# Patient Record
Sex: Female | Born: 1960
Health system: Southern US, Community
[De-identification: ages and names within clinical notes are randomized; demographics above are authoritative.]

## PROBLEM LIST (undated history)

## (undated) DIAGNOSIS — K219 Gastro-esophageal reflux disease without esophagitis: Secondary | ICD-10-CM

## (undated) DIAGNOSIS — D84821 Immunodeficiency due to drugs: Secondary | ICD-10-CM

## (undated) DIAGNOSIS — E119 Type 2 diabetes mellitus without complications: Secondary | ICD-10-CM

## (undated) DIAGNOSIS — N840 Polyp of corpus uteri: Secondary | ICD-10-CM

## (undated) DIAGNOSIS — D249 Benign neoplasm of unspecified breast: Secondary | ICD-10-CM

## (undated) DIAGNOSIS — R51 Headache: Secondary | ICD-10-CM

## (undated) DIAGNOSIS — I10 Essential (primary) hypertension: Secondary | ICD-10-CM

## (undated) DIAGNOSIS — E1140 Type 2 diabetes mellitus with diabetic neuropathy, unspecified: Secondary | ICD-10-CM

## (undated) DIAGNOSIS — E079 Disorder of thyroid, unspecified: Secondary | ICD-10-CM

## (undated) DIAGNOSIS — G4730 Sleep apnea, unspecified: Secondary | ICD-10-CM

## (undated) DIAGNOSIS — E785 Hyperlipidemia, unspecified: Secondary | ICD-10-CM

## (undated) DIAGNOSIS — M75101 Unspecified rotator cuff tear or rupture of right shoulder, not specified as traumatic: Secondary | ICD-10-CM

## (undated) DIAGNOSIS — H9191 Unspecified hearing loss, right ear: Secondary | ICD-10-CM

## (undated) DIAGNOSIS — Z794 Long term (current) use of insulin: Secondary | ICD-10-CM

## (undated) DIAGNOSIS — Z79899 Other long term (current) drug therapy: Secondary | ICD-10-CM

## (undated) DIAGNOSIS — M069 Rheumatoid arthritis, unspecified: Secondary | ICD-10-CM

## (undated) DIAGNOSIS — J189 Pneumonia, unspecified organism: Secondary | ICD-10-CM

## (undated) DIAGNOSIS — R519 Headache, unspecified: Secondary | ICD-10-CM

## (undated) DIAGNOSIS — M7541 Impingement syndrome of right shoulder: Secondary | ICD-10-CM

## (undated) DIAGNOSIS — J45909 Unspecified asthma, uncomplicated: Secondary | ICD-10-CM

## (undated) DIAGNOSIS — D561 Beta thalassemia: Secondary | ICD-10-CM

## (undated) DIAGNOSIS — K760 Fatty (change of) liver, not elsewhere classified: Secondary | ICD-10-CM

## (undated) DIAGNOSIS — L409 Psoriasis, unspecified: Secondary | ICD-10-CM

## (undated) HISTORY — PX: COLONOSCOPY: SHX174

## (undated) HISTORY — PX: TONSILLECTOMY: SUR1361

## (undated) HISTORY — PX: OTHER SURGICAL HISTORY: SHX169

## (undated) HISTORY — DX: Rheumatoid arthritis, unspecified: M06.9

## (undated) HISTORY — PX: TUBAL LIGATION: SHX77

## (undated) HISTORY — PX: EYE SURGERY: SHX253

## (undated) HISTORY — PX: HAND SURGERY: SHX662

## (undated) HISTORY — DX: Polyp of corpus uteri: N84.0

## (undated) HISTORY — PX: ANKLE ARTHROSCOPY: SUR85

## (undated) HISTORY — DX: Hyperlipidemia, unspecified: E78.5

## (undated) HISTORY — DX: Benign neoplasm of unspecified breast: D24.9

## (undated) HISTORY — DX: Fatty (change of) liver, not elsewhere classified: K76.0

## (undated) HISTORY — PX: DILATION AND CURETTAGE OF UTERUS: SHX78

## (undated) HISTORY — DX: Essential (primary) hypertension: I10

## (undated) SURGERY — Surgical Case
Anesthesia: *Unknown

---

## 1997-11-30 ENCOUNTER — Other Ambulatory Visit: Admission: RE | Admit: 1997-11-30 | Discharge: 1997-11-30 | Payer: Self-pay | Admitting: Family Medicine

## 1997-11-30 ENCOUNTER — Ambulatory Visit (HOSPITAL_COMMUNITY): Admission: RE | Admit: 1997-11-30 | Discharge: 1997-11-30 | Payer: Self-pay | Admitting: Family Medicine

## 1997-12-06 ENCOUNTER — Emergency Department (HOSPITAL_COMMUNITY): Admission: EM | Admit: 1997-12-06 | Discharge: 1997-12-06 | Payer: Self-pay | Admitting: Emergency Medicine

## 1997-12-12 ENCOUNTER — Ambulatory Visit (HOSPITAL_COMMUNITY): Admission: RE | Admit: 1997-12-12 | Discharge: 1997-12-12 | Payer: Self-pay | Admitting: Family Medicine

## 1998-07-14 ENCOUNTER — Encounter: Payer: Self-pay | Admitting: Family Medicine

## 1998-07-14 ENCOUNTER — Ambulatory Visit (HOSPITAL_COMMUNITY): Admission: RE | Admit: 1998-07-14 | Discharge: 1998-07-14 | Payer: Self-pay | Admitting: Family Medicine

## 1998-08-09 ENCOUNTER — Emergency Department (HOSPITAL_COMMUNITY): Admission: EM | Admit: 1998-08-09 | Discharge: 1998-08-09 | Payer: Self-pay

## 1998-08-11 ENCOUNTER — Ambulatory Visit (HOSPITAL_COMMUNITY): Admission: RE | Admit: 1998-08-11 | Discharge: 1998-08-11 | Payer: Self-pay | Admitting: Family Medicine

## 1998-08-18 ENCOUNTER — Encounter: Payer: Self-pay | Admitting: Emergency Medicine

## 1998-08-18 ENCOUNTER — Emergency Department (HOSPITAL_COMMUNITY): Admission: EM | Admit: 1998-08-18 | Discharge: 1998-08-19 | Payer: Self-pay | Admitting: Emergency Medicine

## 1998-10-02 ENCOUNTER — Ambulatory Visit (HOSPITAL_COMMUNITY): Admission: RE | Admit: 1998-10-02 | Discharge: 1998-10-02 | Payer: Self-pay | Admitting: Family Medicine

## 1998-10-03 ENCOUNTER — Encounter: Payer: Self-pay | Admitting: Family Medicine

## 1998-11-06 ENCOUNTER — Emergency Department (HOSPITAL_COMMUNITY): Admission: EM | Admit: 1998-11-06 | Discharge: 1998-11-06 | Payer: Self-pay | Admitting: Emergency Medicine

## 1998-11-06 ENCOUNTER — Encounter: Payer: Self-pay | Admitting: Emergency Medicine

## 1998-12-26 ENCOUNTER — Other Ambulatory Visit: Admission: RE | Admit: 1998-12-26 | Discharge: 1998-12-26 | Payer: Self-pay | Admitting: Family Medicine

## 1999-01-11 ENCOUNTER — Encounter: Admission: RE | Admit: 1999-01-11 | Discharge: 1999-01-11 | Payer: Self-pay | Admitting: Obstetrics

## 1999-01-12 ENCOUNTER — Encounter: Admission: RE | Admit: 1999-01-12 | Discharge: 1999-04-12 | Payer: Self-pay | Admitting: Obstetrics

## 1999-01-17 ENCOUNTER — Encounter: Admission: RE | Admit: 1999-01-17 | Discharge: 1999-01-17 | Payer: Self-pay | Admitting: Obstetrics & Gynecology

## 1999-01-24 ENCOUNTER — Encounter: Admission: RE | Admit: 1999-01-24 | Discharge: 1999-01-24 | Payer: Self-pay | Admitting: Obstetrics & Gynecology

## 1999-01-26 ENCOUNTER — Ambulatory Visit (HOSPITAL_COMMUNITY): Admission: RE | Admit: 1999-01-26 | Discharge: 1999-01-26 | Payer: Self-pay | Admitting: Obstetrics & Gynecology

## 1999-01-31 ENCOUNTER — Encounter: Admission: RE | Admit: 1999-01-31 | Discharge: 1999-01-31 | Payer: Self-pay | Admitting: Obstetrics & Gynecology

## 1999-02-07 ENCOUNTER — Encounter: Admission: RE | Admit: 1999-02-07 | Discharge: 1999-02-07 | Payer: Self-pay | Admitting: Obstetrics & Gynecology

## 1999-02-07 ENCOUNTER — Ambulatory Visit (HOSPITAL_COMMUNITY): Admission: RE | Admit: 1999-02-07 | Discharge: 1999-02-07 | Payer: Self-pay | Admitting: Obstetrics & Gynecology

## 1999-02-07 ENCOUNTER — Encounter: Payer: Self-pay | Admitting: Obstetrics & Gynecology

## 1999-02-21 ENCOUNTER — Encounter: Admission: RE | Admit: 1999-02-21 | Discharge: 1999-02-21 | Payer: Self-pay | Admitting: Obstetrics & Gynecology

## 1999-03-07 ENCOUNTER — Encounter: Admission: RE | Admit: 1999-03-07 | Discharge: 1999-03-07 | Payer: Self-pay | Admitting: Obstetrics & Gynecology

## 1999-03-14 ENCOUNTER — Encounter: Admission: RE | Admit: 1999-03-14 | Discharge: 1999-03-14 | Payer: Self-pay | Admitting: Obstetrics & Gynecology

## 1999-03-20 ENCOUNTER — Ambulatory Visit (HOSPITAL_COMMUNITY): Admission: RE | Admit: 1999-03-20 | Discharge: 1999-03-20 | Payer: Self-pay | Admitting: *Deleted

## 1999-03-21 ENCOUNTER — Encounter: Admission: RE | Admit: 1999-03-21 | Discharge: 1999-03-21 | Payer: Self-pay | Admitting: Obstetrics & Gynecology

## 1999-04-04 ENCOUNTER — Encounter: Admission: RE | Admit: 1999-04-04 | Discharge: 1999-04-04 | Payer: Self-pay | Admitting: Obstetrics & Gynecology

## 1999-04-18 ENCOUNTER — Encounter: Admission: RE | Admit: 1999-04-18 | Discharge: 1999-04-18 | Payer: Self-pay | Admitting: Obstetrics & Gynecology

## 1999-04-25 ENCOUNTER — Encounter: Admission: RE | Admit: 1999-04-25 | Discharge: 1999-04-25 | Payer: Self-pay | Admitting: Obstetrics & Gynecology

## 1999-05-04 ENCOUNTER — Inpatient Hospital Stay (HOSPITAL_COMMUNITY): Admission: AD | Admit: 1999-05-04 | Discharge: 1999-05-04 | Payer: Self-pay | Admitting: *Deleted

## 1999-05-08 ENCOUNTER — Ambulatory Visit (HOSPITAL_COMMUNITY): Admission: RE | Admit: 1999-05-08 | Discharge: 1999-05-08 | Payer: Self-pay | Admitting: *Deleted

## 1999-05-09 ENCOUNTER — Encounter: Admission: RE | Admit: 1999-05-09 | Discharge: 1999-05-09 | Payer: Self-pay | Admitting: Obstetrics & Gynecology

## 1999-05-16 ENCOUNTER — Encounter: Admission: RE | Admit: 1999-05-16 | Discharge: 1999-05-16 | Payer: Self-pay | Admitting: Obstetrics & Gynecology

## 1999-05-23 ENCOUNTER — Encounter: Admission: RE | Admit: 1999-05-23 | Discharge: 1999-05-23 | Payer: Self-pay | Admitting: Obstetrics & Gynecology

## 1999-05-30 ENCOUNTER — Encounter: Admission: RE | Admit: 1999-05-30 | Discharge: 1999-05-30 | Payer: Self-pay | Admitting: Obstetrics & Gynecology

## 1999-06-06 ENCOUNTER — Encounter: Admission: RE | Admit: 1999-06-06 | Discharge: 1999-06-06 | Payer: Self-pay | Admitting: Obstetrics

## 1999-06-12 ENCOUNTER — Ambulatory Visit (HOSPITAL_COMMUNITY): Admission: RE | Admit: 1999-06-12 | Discharge: 1999-06-12 | Payer: Self-pay | Admitting: Obstetrics

## 1999-06-13 ENCOUNTER — Encounter: Admission: RE | Admit: 1999-06-13 | Discharge: 1999-06-13 | Payer: Self-pay | Admitting: Obstetrics & Gynecology

## 1999-06-20 ENCOUNTER — Encounter: Admission: RE | Admit: 1999-06-20 | Discharge: 1999-06-20 | Payer: Self-pay | Admitting: Obstetrics & Gynecology

## 1999-06-27 ENCOUNTER — Encounter (HOSPITAL_COMMUNITY): Admission: RE | Admit: 1999-06-27 | Discharge: 1999-08-08 | Payer: Self-pay | Admitting: Obstetrics

## 1999-06-27 ENCOUNTER — Encounter: Admission: RE | Admit: 1999-06-27 | Discharge: 1999-06-27 | Payer: Self-pay | Admitting: Obstetrics & Gynecology

## 1999-07-04 ENCOUNTER — Encounter: Admission: RE | Admit: 1999-07-04 | Discharge: 1999-07-04 | Payer: Self-pay | Admitting: Obstetrics & Gynecology

## 1999-07-05 ENCOUNTER — Inpatient Hospital Stay (HOSPITAL_COMMUNITY): Admission: AD | Admit: 1999-07-05 | Discharge: 1999-07-05 | Payer: Self-pay | Admitting: Obstetrics & Gynecology

## 1999-07-11 ENCOUNTER — Encounter: Admission: RE | Admit: 1999-07-11 | Discharge: 1999-07-11 | Payer: Self-pay | Admitting: Obstetrics & Gynecology

## 1999-07-18 ENCOUNTER — Encounter: Admission: RE | Admit: 1999-07-18 | Discharge: 1999-07-18 | Payer: Self-pay | Admitting: Obstetrics & Gynecology

## 1999-07-25 ENCOUNTER — Inpatient Hospital Stay (HOSPITAL_COMMUNITY): Admission: AD | Admit: 1999-07-25 | Discharge: 1999-07-25 | Payer: Self-pay | Admitting: Obstetrics

## 1999-07-25 ENCOUNTER — Encounter: Admission: RE | Admit: 1999-07-25 | Discharge: 1999-07-25 | Payer: Self-pay | Admitting: Obstetrics & Gynecology

## 1999-08-01 ENCOUNTER — Encounter: Admission: RE | Admit: 1999-08-01 | Discharge: 1999-08-01 | Payer: Self-pay | Admitting: Obstetrics & Gynecology

## 1999-08-07 ENCOUNTER — Inpatient Hospital Stay (HOSPITAL_COMMUNITY): Admission: AD | Admit: 1999-08-07 | Discharge: 1999-08-11 | Payer: Self-pay | Admitting: *Deleted

## 1999-08-07 ENCOUNTER — Encounter (INDEPENDENT_AMBULATORY_CARE_PROVIDER_SITE_OTHER): Payer: Self-pay | Admitting: Specialist

## 1999-08-12 ENCOUNTER — Encounter: Admission: RE | Admit: 1999-08-12 | Discharge: 1999-09-11 | Payer: Self-pay | Admitting: Obstetrics & Gynecology

## 1999-08-13 ENCOUNTER — Inpatient Hospital Stay (HOSPITAL_COMMUNITY): Admission: EM | Admit: 1999-08-13 | Discharge: 1999-08-13 | Payer: Self-pay | Admitting: Obstetrics

## 1999-09-26 ENCOUNTER — Ambulatory Visit (HOSPITAL_COMMUNITY): Admission: RE | Admit: 1999-09-26 | Discharge: 1999-09-26 | Payer: Self-pay | Admitting: Family Medicine

## 1999-10-01 ENCOUNTER — Ambulatory Visit (HOSPITAL_COMMUNITY): Admission: RE | Admit: 1999-10-01 | Discharge: 1999-10-01 | Payer: Self-pay | Admitting: Family Medicine

## 1999-10-02 ENCOUNTER — Encounter: Payer: Self-pay | Admitting: Family Medicine

## 1999-10-03 ENCOUNTER — Ambulatory Visit (HOSPITAL_COMMUNITY): Admission: RE | Admit: 1999-10-03 | Discharge: 1999-10-03 | Payer: Self-pay | Admitting: Orthopaedic Surgery

## 1999-10-03 ENCOUNTER — Encounter: Payer: Self-pay | Admitting: Family Medicine

## 1999-12-02 ENCOUNTER — Encounter: Payer: Self-pay | Admitting: Emergency Medicine

## 1999-12-02 ENCOUNTER — Emergency Department (HOSPITAL_COMMUNITY): Admission: EM | Admit: 1999-12-02 | Discharge: 1999-12-02 | Payer: Self-pay | Admitting: Emergency Medicine

## 2001-07-31 ENCOUNTER — Encounter: Admission: RE | Admit: 2001-07-31 | Discharge: 2001-07-31 | Payer: Self-pay | Admitting: *Deleted

## 2001-08-18 ENCOUNTER — Ambulatory Visit (HOSPITAL_COMMUNITY): Admission: RE | Admit: 2001-08-18 | Discharge: 2001-08-18 | Payer: Self-pay | Admitting: *Deleted

## 2001-08-18 ENCOUNTER — Encounter (INDEPENDENT_AMBULATORY_CARE_PROVIDER_SITE_OTHER): Payer: Self-pay | Admitting: *Deleted

## 2001-12-08 ENCOUNTER — Encounter: Admission: RE | Admit: 2001-12-08 | Discharge: 2001-12-08 | Payer: Self-pay | Admitting: Specialist

## 2001-12-08 ENCOUNTER — Encounter: Payer: Self-pay | Admitting: Specialist

## 2003-08-01 ENCOUNTER — Emergency Department (HOSPITAL_COMMUNITY): Admission: EM | Admit: 2003-08-01 | Discharge: 2003-08-02 | Payer: Self-pay

## 2004-01-13 ENCOUNTER — Other Ambulatory Visit: Admission: RE | Admit: 2004-01-13 | Discharge: 2004-01-23 | Payer: Self-pay | Admitting: Internal Medicine

## 2004-03-07 ENCOUNTER — Ambulatory Visit (HOSPITAL_COMMUNITY): Admission: RE | Admit: 2004-03-07 | Discharge: 2004-03-07 | Payer: Self-pay | Admitting: Internal Medicine

## 2004-04-20 ENCOUNTER — Encounter: Admission: RE | Admit: 2004-04-20 | Discharge: 2004-04-20 | Payer: Self-pay | Admitting: Internal Medicine

## 2004-05-01 ENCOUNTER — Encounter: Admission: RE | Admit: 2004-05-01 | Discharge: 2004-06-06 | Payer: Self-pay | Admitting: Internal Medicine

## 2004-05-11 ENCOUNTER — Emergency Department (HOSPITAL_COMMUNITY): Admission: EM | Admit: 2004-05-11 | Discharge: 2004-05-11 | Payer: Self-pay | Admitting: Emergency Medicine

## 2004-06-14 ENCOUNTER — Encounter: Admission: RE | Admit: 2004-06-14 | Discharge: 2004-09-12 | Payer: Self-pay | Admitting: Internal Medicine

## 2004-06-20 ENCOUNTER — Encounter: Admission: RE | Admit: 2004-06-20 | Discharge: 2004-06-20 | Payer: Self-pay | Admitting: Internal Medicine

## 2004-07-22 ENCOUNTER — Emergency Department (HOSPITAL_COMMUNITY): Admission: EM | Admit: 2004-07-22 | Discharge: 2004-07-22 | Payer: Self-pay | Admitting: Emergency Medicine

## 2004-09-14 ENCOUNTER — Encounter: Admission: RE | Admit: 2004-09-14 | Discharge: 2004-09-14 | Payer: Self-pay | Admitting: Internal Medicine

## 2005-03-07 ENCOUNTER — Encounter: Admission: RE | Admit: 2005-03-07 | Discharge: 2005-03-07 | Payer: Self-pay | Admitting: Internal Medicine

## 2005-04-15 ENCOUNTER — Other Ambulatory Visit: Admission: RE | Admit: 2005-04-15 | Discharge: 2005-04-15 | Payer: Self-pay | Admitting: Cardiology

## 2005-12-14 ENCOUNTER — Emergency Department (HOSPITAL_COMMUNITY): Admission: EM | Admit: 2005-12-14 | Discharge: 2005-12-14 | Payer: Self-pay | Admitting: Emergency Medicine

## 2006-04-30 ENCOUNTER — Encounter: Admission: RE | Admit: 2006-04-30 | Discharge: 2006-04-30 | Payer: Self-pay | Admitting: Internal Medicine

## 2006-05-13 ENCOUNTER — Encounter: Admission: RE | Admit: 2006-05-13 | Discharge: 2006-05-13 | Payer: Self-pay | Admitting: Internal Medicine

## 2006-12-02 ENCOUNTER — Encounter: Admission: RE | Admit: 2006-12-02 | Discharge: 2006-12-02 | Payer: Self-pay | Admitting: Internal Medicine

## 2006-12-11 ENCOUNTER — Encounter: Admission: RE | Admit: 2006-12-11 | Discharge: 2006-12-11 | Payer: Self-pay | Admitting: Internal Medicine

## 2006-12-16 ENCOUNTER — Encounter: Admission: RE | Admit: 2006-12-16 | Discharge: 2006-12-16 | Payer: Self-pay | Admitting: Internal Medicine

## 2006-12-30 ENCOUNTER — Other Ambulatory Visit: Payer: Self-pay | Admitting: Emergency Medicine

## 2006-12-30 ENCOUNTER — Inpatient Hospital Stay (HOSPITAL_COMMUNITY): Admission: AD | Admit: 2006-12-30 | Discharge: 2007-01-01 | Payer: Self-pay | Admitting: Internal Medicine

## 2007-05-04 ENCOUNTER — Encounter: Admission: RE | Admit: 2007-05-04 | Discharge: 2007-05-04 | Payer: Self-pay | Admitting: Internal Medicine

## 2007-06-15 ENCOUNTER — Emergency Department (HOSPITAL_COMMUNITY): Admission: EM | Admit: 2007-06-15 | Discharge: 2007-06-15 | Payer: Self-pay | Admitting: Family Medicine

## 2007-08-05 ENCOUNTER — Other Ambulatory Visit: Admission: RE | Admit: 2007-08-05 | Discharge: 2007-08-05 | Payer: Self-pay | Admitting: Internal Medicine

## 2007-08-18 ENCOUNTER — Encounter: Admission: RE | Admit: 2007-08-18 | Discharge: 2007-08-18 | Payer: Self-pay | Admitting: Internal Medicine

## 2007-11-25 ENCOUNTER — Encounter: Admission: RE | Admit: 2007-11-25 | Discharge: 2007-11-25 | Payer: Self-pay | Admitting: Internal Medicine

## 2008-01-04 ENCOUNTER — Encounter (INDEPENDENT_AMBULATORY_CARE_PROVIDER_SITE_OTHER): Payer: Self-pay | Admitting: Obstetrics and Gynecology

## 2008-01-04 ENCOUNTER — Ambulatory Visit (HOSPITAL_COMMUNITY): Admission: RE | Admit: 2008-01-04 | Discharge: 2008-01-04 | Payer: Self-pay | Admitting: Obstetrics and Gynecology

## 2008-03-01 ENCOUNTER — Encounter: Admission: RE | Admit: 2008-03-01 | Discharge: 2008-03-01 | Payer: Self-pay | Admitting: Internal Medicine

## 2008-04-07 ENCOUNTER — Encounter: Admission: RE | Admit: 2008-04-07 | Discharge: 2008-05-30 | Payer: Self-pay | Admitting: Internal Medicine

## 2009-01-27 ENCOUNTER — Ambulatory Visit: Payer: Self-pay | Admitting: Oncology

## 2009-02-02 LAB — CBC WITH DIFFERENTIAL/PLATELET
BASO%: 0.7 % (ref 0.0–2.0)
Eosinophils Absolute: 0.1 10*3/uL (ref 0.0–0.5)
LYMPH%: 35.1 % (ref 14.0–49.7)
MCHC: 32.1 g/dL (ref 31.5–36.0)
MONO#: 0.7 10*3/uL (ref 0.1–0.9)
MONO%: 12.4 % (ref 0.0–14.0)
NEUT#: 3 10*3/uL (ref 1.5–6.5)
Platelets: 292 10*3/uL (ref 145–400)
RBC: 4.73 10*6/uL (ref 3.70–5.45)
RDW: 15.3 % — ABNORMAL HIGH (ref 11.2–14.5)
WBC: 5.9 10*3/uL (ref 3.9–10.3)

## 2009-02-06 LAB — IRON AND TIBC
%SAT: 27 % (ref 20–55)
TIBC: 327 ug/dL (ref 250–470)

## 2009-02-06 LAB — HEMOGLOBINOPATHY EVALUATION
Hgb A2 Quant: 5.9 % — ABNORMAL HIGH (ref 2.2–3.2)
Hgb A: 93.4 % — ABNORMAL LOW (ref 96.8–97.8)
Hgb S Quant: 0 % (ref 0.0–0.0)

## 2009-02-24 ENCOUNTER — Encounter: Admission: RE | Admit: 2009-02-24 | Discharge: 2009-02-24 | Payer: Self-pay | Admitting: Internal Medicine

## 2009-02-25 ENCOUNTER — Emergency Department (HOSPITAL_COMMUNITY): Admission: EM | Admit: 2009-02-25 | Discharge: 2009-02-25 | Payer: Self-pay | Admitting: Emergency Medicine

## 2009-02-28 ENCOUNTER — Ambulatory Visit: Payer: Self-pay | Admitting: Oncology

## 2009-03-02 LAB — IRON AND TIBC
%SAT: 30 % (ref 20–55)
TIBC: 303 ug/dL (ref 250–470)
UIBC: 212 ug/dL

## 2009-03-02 LAB — CBC WITH DIFFERENTIAL/PLATELET
Eosinophils Absolute: 0.1 10*3/uL (ref 0.0–0.5)
HCT: 30.5 % — ABNORMAL LOW (ref 34.8–46.6)
LYMPH%: 38.9 % (ref 14.0–49.7)
MCV: 66.3 fL — ABNORMAL LOW (ref 79.5–101.0)
MONO#: 0.8 10*3/uL (ref 0.1–0.9)
MONO%: 12.5 % (ref 0.0–14.0)
NEUT#: 2.9 10*3/uL (ref 1.5–6.5)
NEUT%: 46.5 % (ref 38.4–76.8)
Platelets: 358 10*3/uL (ref 145–400)
RBC: 4.59 10*6/uL (ref 3.70–5.45)

## 2009-03-16 ENCOUNTER — Encounter: Admission: RE | Admit: 2009-03-16 | Discharge: 2009-03-16 | Payer: Self-pay | Admitting: Neurosurgery

## 2009-03-21 ENCOUNTER — Encounter: Admission: RE | Admit: 2009-03-21 | Discharge: 2009-03-21 | Payer: Self-pay | Admitting: Neurosurgery

## 2009-06-08 ENCOUNTER — Ambulatory Visit: Payer: Self-pay | Admitting: Oncology

## 2009-07-10 ENCOUNTER — Ambulatory Visit: Payer: Self-pay | Admitting: Oncology

## 2009-07-12 LAB — COMPREHENSIVE METABOLIC PANEL
ALT: 10 U/L (ref 0–35)
AST: 11 U/L (ref 0–37)
Alkaline Phosphatase: 51 U/L (ref 39–117)
Chloride: 102 mEq/L (ref 96–112)
Creatinine, Ser: 0.54 mg/dL (ref 0.40–1.20)
Total Bilirubin: 0.6 mg/dL (ref 0.3–1.2)

## 2009-07-12 LAB — CBC WITH DIFFERENTIAL/PLATELET
BASO%: 0 % (ref 0.0–2.0)
EOS%: 0.1 % (ref 0.0–7.0)
HCT: 33.6 % — ABNORMAL LOW (ref 34.8–46.6)
MCH: 21.3 pg — ABNORMAL LOW (ref 25.1–34.0)
MCHC: 31.8 g/dL (ref 31.5–36.0)
MONO%: 3.3 % (ref 0.0–14.0)
NEUT%: 83.7 % — ABNORMAL HIGH (ref 38.4–76.8)
lymph#: 1.3 10*3/uL (ref 0.9–3.3)

## 2009-07-12 LAB — IRON AND TIBC
%SAT: 22 % (ref 20–55)
TIBC: 336 ug/dL (ref 250–470)

## 2009-08-26 ENCOUNTER — Emergency Department (HOSPITAL_COMMUNITY): Admission: EM | Admit: 2009-08-26 | Discharge: 2009-08-27 | Payer: Self-pay | Admitting: Emergency Medicine

## 2009-09-28 ENCOUNTER — Emergency Department (HOSPITAL_COMMUNITY): Admission: EM | Admit: 2009-09-28 | Discharge: 2009-09-28 | Payer: Self-pay | Admitting: Emergency Medicine

## 2009-12-07 ENCOUNTER — Encounter (HOSPITAL_COMMUNITY): Admission: RE | Admit: 2009-12-07 | Discharge: 2010-03-01 | Payer: Self-pay | Admitting: Internal Medicine

## 2009-12-07 ENCOUNTER — Ambulatory Visit: Payer: Self-pay | Admitting: Oncology

## 2010-01-09 ENCOUNTER — Ambulatory Visit: Payer: Self-pay | Admitting: Oncology

## 2010-01-09 LAB — CBC WITH DIFFERENTIAL/PLATELET
Basophils Absolute: 0.1 10*3/uL (ref 0.0–0.1)
Eosinophils Absolute: 0.1 10*3/uL (ref 0.0–0.5)
HCT: 32.6 % — ABNORMAL LOW (ref 34.8–46.6)
LYMPH%: 33.9 % (ref 14.0–49.7)
MONO#: 0.8 10*3/uL (ref 0.1–0.9)
NEUT#: 5.8 10*3/uL (ref 1.5–6.5)
NEUT%: 56.1 % (ref 38.4–76.8)
Platelets: 365 10*3/uL (ref 145–400)
WBC: 10.4 10*3/uL — ABNORMAL HIGH (ref 3.9–10.3)

## 2010-01-09 LAB — IRON AND TIBC
%SAT: 30 % (ref 20–55)
Iron: 104 ug/dL (ref 42–145)
TIBC: 343 ug/dL (ref 250–470)

## 2010-01-09 LAB — COMPREHENSIVE METABOLIC PANEL
CO2: 22 mEq/L (ref 19–32)
Creatinine, Ser: 0.67 mg/dL (ref 0.40–1.20)
Glucose, Bld: 245 mg/dL — ABNORMAL HIGH (ref 70–99)
Total Bilirubin: 0.4 mg/dL (ref 0.3–1.2)
Total Protein: 7 g/dL (ref 6.0–8.3)

## 2010-01-09 LAB — FERRITIN: Ferritin: 42 ng/mL (ref 10–291)

## 2010-03-14 ENCOUNTER — Encounter (HOSPITAL_COMMUNITY)
Admission: RE | Admit: 2010-03-14 | Discharge: 2010-06-12 | Payer: Self-pay | Source: Home / Self Care | Attending: Internal Medicine | Admitting: Internal Medicine

## 2010-04-17 ENCOUNTER — Encounter: Admission: RE | Admit: 2010-04-17 | Discharge: 2010-04-17 | Payer: Self-pay | Admitting: Internal Medicine

## 2010-07-01 ENCOUNTER — Encounter: Payer: Self-pay | Admitting: Internal Medicine

## 2010-08-30 ENCOUNTER — Encounter (HOSPITAL_COMMUNITY): Payer: Medicare Other | Attending: Internal Medicine

## 2010-08-30 DIAGNOSIS — M069 Rheumatoid arthritis, unspecified: Secondary | ICD-10-CM | POA: Insufficient documentation

## 2010-08-31 LAB — DIFFERENTIAL
Basophils Absolute: 0 10*3/uL (ref 0.0–0.1)
Eosinophils Absolute: 0.1 10*3/uL (ref 0.0–0.7)
Eosinophils Relative: 1 % (ref 0–5)
Monocytes Absolute: 0.7 10*3/uL (ref 0.1–1.0)

## 2010-08-31 LAB — CBC
MCHC: 31 g/dL (ref 30.0–36.0)
Platelets: 353 10*3/uL (ref 150–400)
RDW: 16.3 % — ABNORMAL HIGH (ref 11.5–15.5)

## 2010-08-31 LAB — POCT I-STAT, CHEM 8
Calcium, Ion: 1.15 mmol/L (ref 1.12–1.32)
Creatinine, Ser: 0.4 mg/dL (ref 0.4–1.2)
Hemoglobin: 10.9 g/dL — ABNORMAL LOW (ref 12.0–15.0)
Sodium: 137 mEq/L (ref 135–145)
TCO2: 25 mmol/L (ref 0–100)

## 2010-08-31 LAB — GLUCOSE, CAPILLARY
Glucose-Capillary: 210 mg/dL — ABNORMAL HIGH (ref 70–99)
Glucose-Capillary: 234 mg/dL — ABNORMAL HIGH (ref 70–99)
Glucose-Capillary: 316 mg/dL — ABNORMAL HIGH (ref 70–99)

## 2010-08-31 LAB — CK TOTAL AND CKMB (NOT AT ARMC)
CK, MB: 0.9 ng/mL (ref 0.3–4.0)
Total CK: 72 U/L (ref 7–177)

## 2010-09-12 ENCOUNTER — Encounter (HOSPITAL_COMMUNITY): Payer: Medicare Other | Attending: Internal Medicine

## 2010-09-12 DIAGNOSIS — M069 Rheumatoid arthritis, unspecified: Secondary | ICD-10-CM | POA: Insufficient documentation

## 2010-09-13 ENCOUNTER — Encounter (HOSPITAL_COMMUNITY): Payer: Medicare Other

## 2010-09-20 ENCOUNTER — Other Ambulatory Visit (HOSPITAL_COMMUNITY)
Admission: RE | Admit: 2010-09-20 | Discharge: 2010-09-20 | Disposition: A | Discharge status: Home / Self Care | Payer: Medicare Other | Source: Ambulatory Visit | Attending: Internal Medicine | Admitting: Internal Medicine

## 2010-09-20 ENCOUNTER — Other Ambulatory Visit: Payer: Self-pay | Admitting: Internal Medicine

## 2010-09-20 ENCOUNTER — Other Ambulatory Visit: Payer: Self-pay | Admitting: Registered Nurse

## 2010-09-20 DIAGNOSIS — N6321 Unspecified lump in the left breast, upper outer quadrant: Secondary | ICD-10-CM

## 2010-09-20 DIAGNOSIS — Z124 Encounter for screening for malignant neoplasm of cervix: Secondary | ICD-10-CM | POA: Insufficient documentation

## 2010-09-25 ENCOUNTER — Ambulatory Visit
Admission: RE | Admit: 2010-09-25 | Discharge: 2010-09-25 | Disposition: A | Discharge status: Home / Self Care | Payer: Medicare Other | Source: Ambulatory Visit | Attending: Internal Medicine | Admitting: Internal Medicine

## 2010-09-25 DIAGNOSIS — N6321 Unspecified lump in the left breast, upper outer quadrant: Secondary | ICD-10-CM

## 2010-09-26 ENCOUNTER — Encounter (HOSPITAL_COMMUNITY): Payer: Medicare Other

## 2010-10-23 NOTE — Discharge Summary (Signed)
NAMETIERNEY, BEHL             ACCOUNT NO.:  [id number]   MEDICAL RECORD NO.:  [id number]          PATIENT TYPE:  INP   LOCATION:  5123                         FACILITY:  [facility]   PHYSICIAN:  Marcellus Scott, MD     DATE OF BIRTH:  1960-12-25   DATE OF ADMISSION:  12/30/2006  DATE OF DISCHARGE:  01/01/2007                               DISCHARGE SUMMARY   PRIMARY MEDICAL DOCTOR:  Georgianne Fick, M.D.   DISCHARGE DIAGNOSES:  1. Right thigh and mons pubis abscess, status post incision and      drainage, possibly MRSA.  2  Uncontrolled diabetes.  1. Hypertension.  2. Hypokalemia.  3. Anemia.  4. Leukocytosis.  5. Rheumatoid arthritis.  6. Sore neck, likely musculoskeletal in origin.   DISCHARGE MEDICATIONS:  1. Lyrica 25 mg p.o. b.i.d.  2. Avalide 300/25mg , one p.o. daily.  3. Januvia 100 mg p.o. daily.  4. Actos 30 mg p.o. daily.  5. Lantus 70 units subcutaneously q.12 h.  6. NovoLog sliding scale insulin with h.s. scale after CBGs t.i.d.      a.c. and h.s.  7. Doxycycline 100 mg p.o. b.i.d. for a week.  8. Crestor, Folic acid, Glucophage, Rhinocort at previous home dosages  9. Tylenol 650mg  po q 4-6 hourly prn  10.Oxycodone 5mg  po q 4 hourly prn  11.Prednisone 5mg  po daily   PROCEDURES:  Portable chest x-ray on December 30, 2006.  Impression is  cardiomegaly with linear right upper lobe scarring.  No acute  abnormalities.   PERTINENT LABORATORY DATA:  Basic metabolic panel today remarkable for  potassium of 3.4, BUN 11, creatinine 0.57 and glucose 151.  CBCs with  hemoglobin 9.4, hematocrit 30.1, platelets 341, white blood cell 9.2.  Blood cultures negative to date.  Wound cultures are Gram positive cocci  in clusters.  Final reports are pending.  No anaerobes.  Final cultures  to be followed as an outpatient.   CONSULTATIONS:  Surgery by Dr. [name] and Dr. Johna Sheriff of [facility].   HOSPITAL COURSE/PATIENT DISPOSITION:  For details of the initial  part of  the admission, please refer to the History and Physical note that was  done by Dr. Lonia Blood.  In summary, Ms. Grilliot is a pleasant 50-year-  old African American female patient with history of type 2 diabetes,  rheumatoid arthritis, hypertension, who was referred from Urgent Care  for admission.  She presented with history of pain and swelling of the  right thigh.  This was getting worse despite oral antibiotic therapy.  Further evaluation revealed that the patient had abscess of right thigh  and mons pubis.  The patient was thereby admitted to the medical floor.  Her blood cultures were sent off.  A surgical consult was obtained.  The  surgeons proceeded to incise and debride the abscess.  She was placed on  empiric antibiotics of intravenous Clindamycin and Vancomycin.  With  these measures, the patient's right thigh is significantly better with  marked reduction in her pain, swelling and redness.  Her fevers have  resolved.  Her wound is said to be  clean according to surgery.  The  surgeons have indicated that the patient is stable for discharge from  their perspective with home health nurse for daily dressings with normal  saline gauzes.  They indicated that she might not actually even need  antibiotics, but have suggested a week's course of doxycycline, and she  is to follow up with the surgeons in 1-2 weeks time.  The patient has  been instructed to seek immediate medical attention if there is any  further deterioration in her overall condition.  Her glycemic control  has been poor secondary to her ongoing infection.  Her insulins have  been titrated.  With improvement in her infection, it is expected that  her insulin requirements may decrease.  She has been advised and  counseled regarding hypoglycemic symptoms and management.  To consider  adjusting her insulins as an outpatient.  The patient's hypokalemia will  be repleted prior to discharge.  Please follow up  patients hemoglobin  A1C and her basic metabolic panel as an outpatient.  The patient, this  morning, was complaining of some sore/stiffness around the neck with dry  mouth and sore throat.  However, there is no lymphadenopathy or  tenderness or neck stiffness.  Her oral cavity is not remarkable for any  oropharyngeal erythema.  This is probably musculoskeletal in origin.  The patient has been provided pain medications and to monitor this  closely.  The patients' Enbrel is on hold at this time secondary to her  ongoing infection.  The patient is counseled to follow up with her  rheumatologist regarding restarting this at a later time.  An  appointment has been made for the patient to be followed up with her  primary medical doctor on January 05, 2007, at 1:25 p.m.      Marcellus Scott, MD  Electronically Signed     AH/MEDQ  D:  01/01/2007  T:  01/01/2007  Job:  [id number]   cc:   Georgianne Fick, M.D.  Gabrielle Dare [name], M.D.  Lorne Skeens. Hoxworth, M.D.  Lemmie Evens, M.D.

## 2010-10-23 NOTE — Op Note (Signed)
NAMETKAI, LARGE             ACCOUNT NO.:  [id number]   MEDICAL RECORD NO.:  [id number]          PATIENT TYPE:  AMB   LOCATION:  SDC                           FACILITY:  [facility]   PHYSICIAN:  Juluis Mire, M.D.   DATE OF BIRTH:  [birth year]   DATE OF PROCEDURE:  01/04/2008  DATE OF DISCHARGE:                               OPERATIVE REPORT   PREOPERATIVE DIAGNOSIS:  Endometrial polyp.   POSTOPERATIVE DIAGNOSIS:  Endometrial polyp.   PROCEDURE:  Paracervical block.  Cervical dilatation.  Hysteroscopic  evaluation with resection of polyps.  Endometrial curettings.   SURGEON:  Juluis Mire, MD   ANESTHESIA:  General.   ESTIMATED BLOOD LOSS:  Minimal.   PACKS AND DRAINS:  None.   INTRAOPERATIVE BLOOD PLACED:  None.   COMPLICATIONS:  None.   INDICATIONS:  As dictated in the history and physical.   PROCEDURE:  The patient was taken to the OR and placed in the supine  position.  After satisfactory level of general anesthesia was obtained  using a LMA, the patient was placed in the dorsolithotomy position.  Perineum and vagina prepped with Betadine and draped in a sterile  fields.  Speculum was placed in the vaginal vault.  The cervix was  grasped with a single-tooth tenaculum.  Paracervical block with 1%  Nesacaine was instituted.  Uterus sounded to 10 cm.  Cervix dilated with  a size-31 Pratt dilator.  Operative  hysteroscope was then introduced.  Intrauterine cavity was descended using sorbitol.  Visualization  revealed multiple endometrial polyps.  These were resected and sent to  the pathology.  Once all the polyps been resected.  We did endometrial  curettings.  These were also sent to pathology.  Total deficit was about  250 mL.  She had no active bleed or signs of perforation.  At this point  in time, the single-tooth tenaculum was inspected and removed.  The  patient was taken out of the dorsolithotomy position, once alert and  extubated, transferred to recovery  room in good condition.  Sponge,  instruments, and needle count was reported correct by circulating nurse.     Juluis Mire, M.D.  Electronically Signed    JSM/MEDQ  D:  01/04/2008  T:  01/04/2008  Job:  [id number]

## 2010-10-23 NOTE — Op Note (Signed)
NAMEELIZABETHANNE, Jessica Howard             ACCOUNT NO.:  [id number]   MEDICAL RECORD NO.:  [id number]          PATIENT TYPE:  INP   LOCATION:  5123                         FACILITY:  [facility]   PHYSICIAN:  [name], M.D.DATE OF BIRTH:  22-Jan-1961   DATE OF PROCEDURE:  12/30/2006  DATE OF DISCHARGE:                               OPERATIVE REPORT   PREOPERATIVE DIAGNOSIS:  Abscess right thigh and left mons pubis.   POSTOPERATIVE DIAGNOSIS:  Abscess right thigh and left mons pubis.   PROCEDURE:  Incision and drainage and debridement of abscess right thigh  and left mons pubis.   SURGEON:  Violeta Gelinas, M.D.   ANESTHESIA:  General.   HISTORY OF PRESENT ILLNESS:  The patient is a 50 year old African  American female who was admitted to the medical service today with  uncontrolled diabetes and abscess and cellulitis of the right thigh and  the left mons pubis.  She is brought emergently to the operating room  for drainage.   PROCEDURE IN DETAIL:  Informed consent was obtained.  The patient is  receiving intravenous antibiotics.  She was brought to the operating  room.  General anesthesia was administered by the anesthesia team and  she was placed in lithotomy.  Her right thigh and pubic area were  prepped and draped in a sterile fashion.  There was a large blister over  the most fluctuant region on the right thigh.  This is posterior and  medial.  This was excised with an elliptical incision.  The tissue was  sent to pathology.  Further debridement of the subcutaneous tissues were  done and an abscessed cavity was entered extending anteriorly.  Purulent  fluid was sent for culture and sensitivity.  The wound was copiously  irrigated out.  Hemostasis was obtained with Bovie cautery and a  laparotomy pack was placed inside. The laparotomy pack was then removed  and hemostasis was again ensured and the wound was packed with a saline-  soaked Kerlix gauze.   Attention was directed to  the mons pubis abscess.  This was only about 3  or 4 cm in length.  A  transverse incision was made.  Some purulent  fluid immediately evacuated.  This was sent for culture and sensitivity.  The wound was thoroughly debrided and irrigated out.  Hemostasis was  ensured and the wound was packed again with saline-soaked Kerlix.  Bulky  sterile dressings were applied in both places.  The patient tolerated  the procedure well without apparent complication and was taken to the  recovery room in stable condition.      Gabrielle Dare [name], M.D.  Electronically Signed     BET/MEDQ  D:  12/30/2006  T:  12/31/2006  Job:  [id number]

## 2010-10-23 NOTE — H&P (Signed)
NAMELATEESHA, BEZOLD NO.:  [id number]   MEDICAL RECORD NO.:  [id number]          PATIENT TYPE:  AMB   LOCATION:  SDC                           FACILITY:  [facility]   PHYSICIAN:  Juluis Mire, M.D.   DATE OF BIRTH:  1961-03-23   DATE OF ADMISSION:  01/04/2008  DATE OF DISCHARGE:                              HISTORY & PHYSICAL   A 50 year old gravida 3, para 2, abortus 1 female presents for  hysteroscopy.   The patient initially was referred to our office with menorrhagia and  associated anemia.  Subsequent saline infusion ultrasound revealed a  large endometrial polyp.  The patient now presents for hysteroscopic  resection.  Hemoglobin has been depressed, last value was 9.9.  The  patient does have history of thalassemia and therefore does have a  chronically low hemoglobin.   ALLERGIES:  No known drug allergies.   MEDICATIONS:  1. Metformin 1000 mg twice daily.  2. Prednisone 5 mg once daily.  3. Meloxicam 7.5 mg once daily.  4. Avalide 300 mg once a day.  5. Folic acid 1 mg once a day.  6. Crestor 20 mg a day.  7. Lantus 85 units morning and night.  8. Enbrel 1 injection a week.   PAST MEDICAL HISTORY:  Significant for history of hypertension,  hypercholesterolemia, arthritis, as well as glucose intolerance.  She is  being actively managed on medications as noted.   PAST SURGICAL HISTORY:  She has had a previous bilateral tubal ligation.  She has had two vaginal deliveries and one miscarriage.   SOCIAL HISTORY:  No tobacco or alcohol use.   REVIEW OF SYSTEMS:  Noncontributory.   PHYSICAL EXAMINATION:  GENERAL:  The patient is afebrile with stable  vital signs.  HEENT:  The patient is normocephalic.  Pupils are equal, round, and  reactive to light and accommodation.  Extraocular movements were intact.  Sclerae and conjunctivae are clear.  Oropharynx clear.  NECK:  Without thyromegaly.  BREASTS:  No discrete masses.  LUNGS:  Clear.  CARDIOVASCULAR:   Regular rate.  No murmurs or gallops.  ABDOMEN:  Benign.  No mass, organomegaly, or tenderness.  PELVIC:  Normal external genitalia.  Vaginal mucosa is clear.  Cervix  unremarkable.  Uterus, normal size, shape, and contour.  Adnexa, free of  masses or tenderness.  EXTREMITIES:  Trace edema.  NEUROLOGIC:  Grossly within normal limits.   IMPRESSION:  1. Menorrhagia, secondary to endometrial polyp.  2. Hypertension.  3. Glucose intolerance.  4. Hypercholesterolemia.  5. Arthritis.   PLAN:  The patient will undergo hysteroscopic resection of polyp.  The  risks of surgery have been discussed including the risk of infection.  The risk of hemorrhage that could require transfusion with risk of AIDS  or hepatitis.  Risk of injury to adjacent organs including bladder,  bowel, ureters that could require further exploratory surgery.  Risk of  deep venous thrombosis and pulmonary emboli.  The patient expressed  understanding.      Juluis Mire, M.D.  Electronically Signed     JSM/MEDQ  D:  01/04/2008  T:  01/04/2008  Job:  [id number]

## 2010-10-23 NOTE — H&P (Signed)
Jessica Howard, Jessica Howard             ACCOUNT NO.:  [id number]   MEDICAL RECORD NO.:  [id number]           PATIENT TYPE:   LOCATION:                                 FACILITY:   PHYSICIAN:  Lonia Blood, M.D.       DATE OF BIRTH:  23-Aug-1960   DATE OF ADMISSION:  12/30/2006  DATE OF DISCHARGE:                              HISTORY & PHYSICAL   PRIMARY CARE PHYSICIAN:  Georgianne Fick, M.D.   CHIEF COMPLAINT:  Right thigh pain.   HISTORY OF PRESENT ILLNESS:  Mrs. Guier is a 50 year old woman with  past medical history of diabetes mellitus type 2, rheumatoid arthritis  and hypertension who was referred for admission from the Urgent Care  after she presented with 4 days of cellulitis, swelling of the right  thigh, and pain.  She was seen by the primary care physician, yesterday,  and started on clindamycin and doxycycline.  She continued have severe  thigh pain; and started developing boils in the suprapubic as well, got  worried, and came to the Urgent [facility] today.  She reports that a  night ago she had fever and chills.  Currently she does not have any  nausea, vomiting, or abdominal pain.   PAST MEDICAL HISTORY:  1. Diabetes mellitus type 2.  2. Rheumatoid arthritis.  3. Left breast fibroadenoma  4. Hepatic steatosis.  5. Graves disease.  6. Bilateral tubal ligation.  7. Hypertension.  8. Hyperlipidemia.   ALLERGIES:  The patient is allergic to penicillin, sulfa, and  cephalosporins causing angioedema, hives, and itching.   SOCIAL HISTORY:  The patient does not smoke cigarettes, does not drink  alcohol.  She is on disability.  She has two children; and she is  separated   FAMILY HISTORY:  The patient's mother has been recently diagnosed with  CHF and also has diabetes.  Patient has two sisters with diabetes and  CHF.   REVIEW OF SYSTEMS:  As per HPI.  Also of note, the patient does not have  chest pain.  Denies shortness of breath.  No coughing, no headaches, no  focal weakness or numbness, no suicidal, or homicidal ideations.  Other  systems as per HPI.   PHYSICAL EXAM:  VITAL SIGNS:  Upon admission temperature 98.3, blood  pressure 141/94, heart rate 109, respirations 20, saturation 90% on room  air.  GENERAL APPEARANCE:  A morbidly obese woman sitting in chair.  Alert to  event, person, place, and time.  HEAD:  Appears normocephalic, atraumatic.  EYES:  Pupils equal, round react to light and accommodation.  Extraocular movements intact.  THROAT:  Clear.  NECK:  Supple, no JVD, no carotid bruits.  CHEST:  Clear to auscultation without wheezes, rhonchi, or crackles.  HEART:  Tachycardic, regular, without murmurs, rubs or gallops.  ABDOMEN:  The patient's abdomen is soft, nontender, and normal bowel  sounds are present.  Palpable hepatomegaly 2 cm below the costal margin.  SKIN:  Warm and dry.  On the right side there is a 10 cm area of large  indurated abscess with a central necrotic area.  In the patient's left  suprapubic area, there is a 2-cm area of an abscess without any signs of  drainage.  There are also very small areas of induration on the labia  major bilaterally.  NEUROLOGICAL EXAM:  Cranial nerves III-XII are intact.  Sensation  intact.  Strength 5/5 in all four extremities.  The patient can ambulate  without any difficulty.   The laboratory values on admission:  White blood cell count 15,000,  hemoglobin 10, platelet count 289.  Sodium 133, potassium 3.5, glucose  307, BUN 7, and creatinine 0.6.   ASSESSMENT/PLAN:  1. Right thigh abscess with a couple of satellite abscesses in the      suprapubic area.  The patient also has a significant area of      cellulitis surrounding these findings.  Given the fact that the      patient has diabetes, these findings are of increase concern with a      possibility that hse can quickly deteriorate and the possibility of      developing fasciitis.  Currently, I do not see any signs of  that.      The patient does not seem to be systemically ill.  She will need      incision and debridement of the abscess and a general surgery      consultation has been obtained.  Cultures will be obtained.      Empiric antibiotics with clindamycin and vancomycin, as well as has      been started.  I will not cover, at this point in time, for gram-      negative rods unless we do have evidence of gram-negative rods      present on the smear.  The etiology of the patient's findings is      probably methicillin-resistant Staphylococcus aureus.  2. Uncontrolled diabetes mellitus type 2.  We will keep the patient on      Lantus sliding scale insulin while here.  We will obtain a      hemoglobin A1c to look for long-term control.  3. Hepatic steatosis we will check liver enzymes to see the status.  4. Rheumatoid arthritis.  This seems to be under fairly good control      on Enbrel currently.  Obviously, for now, we are not going to be      able to give the Enbrel, but we will watch the patient closely here      in the hospital.      Lonia Blood, M.D.  Electronically Signed     SL/MEDQ  D:  12/30/2006  T:  12/31/2006  Job:  [id number]   cc:   Georgianne Fick, M.D.

## 2010-10-23 NOTE — Consult Note (Signed)
NAMEKRISHANA, Howard             ACCOUNT NO.:  [id number]   MEDICAL RECORD NO.:  [id number]          PATIENT TYPE:  INP   LOCATION:  5123                         FACILITY:  [facility]   PHYSICIAN:  Sharlet Salina T. Hoxworth, M.D.DATE OF BIRTH:  09/09/1960   DATE OF CONSULTATION:  12/30/2006  DATE OF DISCHARGE:                                 CONSULTATION   REASON FOR CONSULTATION:  Mons and right thigh abscesses.   HISTORY OF PRESENT ILLNESS:  [name] is a 46-year female patient  with multiple medical problems who reports several days ago noticing  some tenderness and knots in her mons as well as her thigh area.  She  was awakened with increasing pain in the right side today and initially  thought she should go see her doctor but felt symptoms were bad enough  she needed to come to the ER.  Her white count was found to greater than  15,000.  She apparently has a history of prior mons abscesses in the  past, possibly MRSA last year that required hospitalization and IV  antibiotic therapy but did not require an I&D treatment.  She has  subsequently been admitted by internal medicine and has been started  empirically on clindamycin and vancomycin.  She is allergic to  PENICILLIN, CEFOXITIN, SULFA and ERYTHROMYCIN.  Surgical evaluation for  an I&D has been requested.   SOCIAL HISTORY:  No alcohol.  No tobacco.  She is disabled.  She has two  children.   PAST MEDICAL HISTORY:  1. Uncontrolled diabetes type 2.  2. Morbid obesity.  3. Hypertension.  4. Rheumatoid arthritis.  5. Anemia.  6. Left breast fibroadenoma.  7. Hepatic steatosis.  8. Graves' disease.   PAST SURGICAL HISTORY:  1. C-section x2.  2. Bilateral tubal ligation.  3. Multiple orthopedic procedures.   ALLERGIES:  1. PENICILLIN.  2. CEFOXITIN.  3. SULFA.  4. ERYTHROMYCIN.   HOME MEDICATIONS:  Include chronic prednisone, Mobic, Lyrica, folate,  glipizide, Lantus insulin, Actos, metformin, Avalide, Skelaxin,  Enbrel,  Vicodin and Januvia.   PHYSICAL EXAMINATION:  GENERAL:  Pleasant female patient, currently  complaining of significant pain in her right thigh.  VITAL SIGNS:  Temperature 98.3, BP 141/94, pulse 109, respirations 20.  NEUROLOGIC:  Alert and oriented x3, moving all extremities x4.  No focal  deficits.  HEENT:  Head normocephalic, sclerae noninjected.  NECK:  Supple.  No adenopathy.  CHEST:  Bilateral lung sounds clear to auscultation, respiratory effort  is nonlabored.  CARDIAC:  S1, S2 without rubs, murmurs, thrills or gallops.  Pulses  regular.  No JVD.  ABDOMEN:  Obese, soft, nontender, nondistended, without  hepatosplenomegaly, masses or bruits noted.  GENITOURINARY:  The patient has a linear/horizontal abscess on the left  anterior mons in the hair area measuring 4-cm length and 1-cm width,  quite tender, no drainage.  She also has multiple small, folliculitis-  type lesions in several areas of the mons and in the vulvar region.  EXTREMITIES:  Symmetrical in appearance.  Edema in the right thigh with  the right thigh having a complex area involving a large  area of edema in  the upper thigh, circular-type pattern, estimated size 20 x 20 cm.  There is a central area of induration and erythema which is exquisitely  tender measuring 8 x 3 cm.  In the middle of this area there is a 2 x 2  cm complex vesicular area that is currently not draining.  Also, in the  larger area of edema there are some cellulitic skin changes without  induration.   LABORATORIES:  ESR is 52.  White count 15,200; neutrophils 73%;  hemoglobin 10.2; platelets 289,000.  Sodium is 133, potassium 3.5,  glucose 307, BUN 7, creatinine is 0.6.   IMPRESSION:  1. Right thigh and left mons abscesses requiring incision and      drainage.  2. Uncontrolled diabetes.  3. Obesity.  4. Rheumatoid arthritis on Enbrel/immunosuppressive therapy.  5. Hypertension.  6. Graves' disease.   PLAN:  1. Continue empiric  antibiotic treatment as noted.  2. Probable operative intervention tonight for I&D.  This is pending      surgeon's evaluation.  He may determine that bedside drainage is      appropriate.  Either way, we will obtain cultures with the I&D.      Allison L. Rennis Harding, N.P.      Lorne Skeens. Hoxworth, M.D.  Electronically Signed    ALE/MEDQ  D:  12/30/2006  T:  12/31/2006  Job:  [id number]

## 2010-10-24 ENCOUNTER — Encounter (HOSPITAL_COMMUNITY): Payer: Medicare Other | Attending: Internal Medicine

## 2010-10-24 DIAGNOSIS — M069 Rheumatoid arthritis, unspecified: Secondary | ICD-10-CM | POA: Insufficient documentation

## 2010-10-26 NOTE — Discharge Summary (Signed)
[facility]  Patient:    Jessica Howard, Jessica Howard                    MRN: [id number] Adm. Date:  [id number] Disc. Date: 08/11/99 Attending:  Michaelle Copas                           Discharge Summary  DISCHARGE DIAGNOSES:          1. Low transverse cesarean section secondary to                                  failure to progress.                               2. Type 2 diabetes.                               3. Premature rupture of membranes at 36 weeks.                               4. Status post bilateral tubal ligation.                               5. Hyperthyroidism on PTU therapy.  LABORATORY:                   Significant laboratory to include RPR that was nonreactive, GBS that was negative, too numerous to count WBC on a wet prep. Two hematocrits 26.1 and 22 respectively.  Normal platelets and WBC noted.  HOSPITAL COURSE:              #1 - SPONTANEOUS RUPTURE OF MEMBRANES:  Patient was admitted for anticipated SVD, hemodynamically stable.  She tolerated low dose Pitocin per protocol, so I anticipated labor.  She did well overall, but in light of P-PROM was begun on IV Clindamycin during labor.  She was given IV analgesia  initially and then tolerated an epidural, once progress was made in active labor; however, in light of failure to progress she was sent for repeat low transverse  C-section and also had bilateral tubal ligation and salpingo partial oophorectomy. Patient tolerated this well with no underlying complications.  Postoperatively he remained hemodynamically stable on adequate pain medications.  She took adequate p.o. intake postoperatively and was ambulating well.  She required no further surgical intervention and adequate hemostasis was also noted.                                #2 - HISTORY OF HYPERTHYROIDISM:  Patients PTU level was increased to 100 mg twice a day and followed postoperatively to delivery and will  follow up with endocrinologist, Dr. Hal Hope, within the week after discharge.                                #3 - TYPE 2 DIABETES:  No sliding scale insulin was required on serial CBGs.  Glucose checks q.a.c. and q.h.s. respectively and this was followed throughout hospitalization and she  was covered as needed.  Insulin  regimen to be determined upon discharge pending p.o. intake.                                #4 - TUBAL LIGATION:  Patient tolerated tubal ligation without complications.  No other further contraception needed at this time.  DISCHARGE CONDITION:          Good.  DISCHARGE DISPOSITION:        Home.  DISCHARGE ACTIVITY:           Pelvic rest x 6 weeks.  DISCHARGE FOLLOW-UP:          With health department within the next six weeks.  She is to follow up in three days for recheck of staples and removal of this.  DISCHARGE MEDICATIONS:        Ibuprofen 600 mg one p.o. q.6, #30, no refills given. DD:  08/09/99 TD:  08/09/99 Job: [id number]

## 2010-10-26 NOTE — Op Note (Signed)
[facility]  Patient:    Jessica Howard, Jessica Howard                    MRN: [id number] Proc. Date: 08/08/99 Adm. Date:  [id number] Attending:  Michaelle Copas                           Operative Report  PREOPERATIVE DIAGNOSES:       Thirty-six-weeks gestation; premature rupture of membranes; type 2 diabetic, insulin dependent; arrest of labor, first stage; previous cesarean section; desires sterilization.  POSTOPERATIVE DIAGNOSES:      Thirty-six-weeks gestation; premature rupture of membranes; type 2 diabetic, insulin dependent; arrest of labor, first stage; previous cesarean section; desires sterilization.  PROCEDURE:                    Repeat low transverse cesarean section and bilateral partial salpingectomy (Pomeroy technique).  SURGEON:                      Charles A. Clearance Coots, M.D.  ASSISTANT:                    ______  ANESTHESIA:                   Spinal.  ESTIMATED BLOOD LOSS:         800 ml.  IV FLUIDS:                    1300 ml.  URINE OUT:                    600 ml, clear.  COMPLICATIONS:                None.  DRAINS:                       Foley to gravity.  SPECIMENS:                    Approximately 2-cm segments of right and left fallopian tubes.  FINDINGS:                     Viable female at 2004, Apgars of 9 at one minute nd 9 at five minutes, weight of 7 pounds and 2 ounces, cord pH of 7.24.  Normal uterus, ovaries and fallopian tubes.  DESCRIPTION OF PROCEDURE:     The patient was brought to the operating room and  after satisfactory spinal anesthesia, the abdomen was prepped and draped in the  usual sterile fashion.  A Pfannenstiel skin incision was made with a scalpel through the previous scar down to the fascia.  The fascia was nicked in the midline and the fascial incision was extended to the left and to the right with curved ayo scissors.  The superior and inferior fascial edges were taken off the  rectus muscles with both blunt and sharp dissection.  The rectus muscle was divided sharply and bluntly in the midline, being careful to avoid the urinary bladder inferiorly.  The peritoneum was entered digitally and was digitally extended to the left and to the right.  The bladder blade was positioned and the vesicouterine old of peritoneum above the urinary bladder was grasped with forceps and was incised and undermined with Metzenbaum scissors.  The incision was extended to the left and to the  right with the Metzenbaum scissors.  The bladder flap was bluntly developed and the bladder blade was repositioned in front of the urinary bladder, placing it well out of the operative field.  The uterus was entered transversely in the lower uterine segment with the scalpel; clear fluid was expelled.  The uterine incision was extended to the left and to the right with the bandage scissors.  The vertex was delivered with the aid of fundal pressure from the assistant.  The infants mouth and nose were suctioned with the suction bulb and the delivery was completed with the aid of fundal pressure from the assistant.  The umbilical cord was clamped and cut and the infant was handed off to the nursery staff.  Cord pH and cord blood were obtained and the placenta was spontaneously expelled from the uterine cavity intact.  The uterus was exteriorized and the endometrial surface was thoroughly  debrided with a dry lap sponge.  The uterus was closed in two layers -- the first layer was closed with a continuous interlocking suture of 0 Monocryl; the second layer was closed with a continuous imbricating suture of 0 Monocryl. Hemostasis was excellent.  The bladder flap was then closed with continuous suture of 3-0 Monocryl.  Attention was then turned above for the tubal ligation procedure. The left fallopian tube was grasped in the isthmic area with a Babcock clamp.  The ube was identified from  the corneal end to the fimbrial end in the grasp of the Babcock clamp.  A knuckle of tube beneath the Babcock clamp in the isthmic area was doubly ligated with #1 plain catgut and the section of tube above the knot was excised  with Metzenbaum scissors and the specimen was submitted to pathology for evaluation.  There was no active bleeding from the tubal stumps.  The same procedure was performed on the opposite side without complications.  The uterus was then placed back in its normal anatomic position.  The pelvic cavity was thoroughly irrigated with warm saline solution and all clots were removed.  The closure of the uterus was again observed for hemostasis and there was no active bleeding noted. The abdomen was then closed as follows:  The fascia was closed with a continuous suture of 0 PDS from each corner to the center; subcutaneous tissue was thoroughly irrigated with warm saline solution and all areas of subcutaneous bleeding were  coagulated with the Bovie; the skin was then approximated with surgical stainless steel staples.  A sterile pressure bandage was applied to the incision closure.  Patient tolerated the procedure well.  Surgical technician indicated that all needle, sponge and instrument counts were correct.  The patient was then transferred to the recovery room in satisfactory condition.DD:  08/08/99 TD:  08/09/99 Job: [id number]

## 2010-11-14 ENCOUNTER — Other Ambulatory Visit: Payer: Self-pay | Admitting: Internal Medicine

## 2010-11-14 DIAGNOSIS — R1011 Right upper quadrant pain: Secondary | ICD-10-CM

## 2010-11-20 ENCOUNTER — Ambulatory Visit
Admission: RE | Admit: 2010-11-20 | Discharge: 2010-11-20 | Disposition: A | Discharge status: Home / Self Care | Payer: Medicare Other | Source: Ambulatory Visit | Attending: Internal Medicine | Admitting: Internal Medicine

## 2010-11-20 DIAGNOSIS — R1011 Right upper quadrant pain: Secondary | ICD-10-CM

## 2010-11-22 ENCOUNTER — Encounter (HOSPITAL_COMMUNITY): Payer: Medicare Other | Attending: Internal Medicine

## 2010-11-22 ENCOUNTER — Other Ambulatory Visit (HOSPITAL_COMMUNITY): Payer: Self-pay | Admitting: Internal Medicine

## 2010-11-22 DIAGNOSIS — R1084 Generalized abdominal pain: Secondary | ICD-10-CM

## 2010-11-22 DIAGNOSIS — M069 Rheumatoid arthritis, unspecified: Secondary | ICD-10-CM | POA: Insufficient documentation

## 2010-11-29 ENCOUNTER — Other Ambulatory Visit: Payer: Medicare Other

## 2010-12-05 ENCOUNTER — Ambulatory Visit (HOSPITAL_COMMUNITY)
Admission: RE | Admit: 2010-12-05 | Discharge: 2010-12-05 | Disposition: A | Discharge status: Home / Self Care | Payer: Medicare Other | Source: Ambulatory Visit | Attending: Internal Medicine | Admitting: Internal Medicine

## 2010-12-05 DIAGNOSIS — R1084 Generalized abdominal pain: Secondary | ICD-10-CM

## 2010-12-05 DIAGNOSIS — R109 Unspecified abdominal pain: Secondary | ICD-10-CM | POA: Insufficient documentation

## 2010-12-05 MED ORDER — TECHNETIUM TC 99M SULFUR COLLOID
2.0000 | Freq: Once | INTRAVENOUS | Status: AC | PRN
  Administered 2010-12-05: 2.2 via INTRAVENOUS

## 2011-01-04 ENCOUNTER — Encounter (HOSPITAL_COMMUNITY): Payer: Medicare Other | Attending: Internal Medicine

## 2011-01-04 DIAGNOSIS — M069 Rheumatoid arthritis, unspecified: Secondary | ICD-10-CM | POA: Insufficient documentation

## 2011-01-17 ENCOUNTER — Encounter (HOSPITAL_COMMUNITY): Payer: Medicare Other

## 2011-02-04 ENCOUNTER — Encounter (HOSPITAL_COMMUNITY): Payer: Medicare Other

## 2011-02-07 ENCOUNTER — Encounter (HOSPITAL_COMMUNITY): Payer: Medicare Other | Attending: Internal Medicine

## 2011-02-07 DIAGNOSIS — M069 Rheumatoid arthritis, unspecified: Secondary | ICD-10-CM | POA: Insufficient documentation

## 2011-03-07 ENCOUNTER — Encounter (HOSPITAL_COMMUNITY): Payer: Medicare Other

## 2011-03-08 ENCOUNTER — Encounter (HOSPITAL_COMMUNITY): Payer: Medicare Other | Attending: Internal Medicine

## 2011-03-08 DIAGNOSIS — M069 Rheumatoid arthritis, unspecified: Secondary | ICD-10-CM | POA: Insufficient documentation

## 2011-03-08 LAB — BASIC METABOLIC PANEL
BUN: 10
Calcium: 9.2
GFR calc non Af Amer: >60
Potassium: 3.8
Sodium: 133 — ABNORMAL LOW

## 2011-03-08 LAB — GLUCOSE, CAPILLARY: Glucose-Capillary: 268 — ABNORMAL HIGH

## 2011-03-08 LAB — HCG, SERUM, QUALITATIVE: Preg, Serum: NEGATIVE

## 2011-03-08 LAB — CBC
Platelets: 331
RDW: 17.9 — ABNORMAL HIGH
WBC: 6.4

## 2011-03-25 LAB — BASIC METABOLIC PANEL
BUN: 9
CO2: 27
CO2: 29
Calcium: 8.5
Chloride: 102
Creatinine, Ser: 0.57
Creatinine, Ser: 0.6
GFR calc Af Amer: >60
GFR calc non Af Amer: >60
Glucose, Bld: 263 — ABNORMAL HIGH

## 2011-03-25 LAB — ANAEROBIC CULTURE

## 2011-03-25 LAB — I-STAT 8, (EC8 V) (CONVERTED LAB)
BUN: 7
Chloride: 99
Glucose, Bld: 307 — ABNORMAL HIGH
Potassium: 3.5
Sodium: 133 — ABNORMAL LOW
TCO2: 26
pH, Ven: 7.393 — ABNORMAL HIGH

## 2011-03-25 LAB — CULTURE, BLOOD (ROUTINE X 2)
Culture: NO GROWTH
Culture: NO GROWTH

## 2011-03-25 LAB — POCT I-STAT CREATININE: Creatinine, Ser: 0.6

## 2011-03-25 LAB — CBC
HCT: 32.4 — ABNORMAL LOW
Hemoglobin: 10.2 — ABNORMAL LOW
MCHC: 31.3
MCHC: 31.9
MCV: 67.8 — ABNORMAL LOW
MCV: 68.3 — ABNORMAL LOW
Platelets: 263
Platelets: 289
RBC: 4.43
RDW: 16.1 — ABNORMAL HIGH
WBC: 15.2 — ABNORMAL HIGH
WBC: 9.2

## 2011-03-25 LAB — COMPREHENSIVE METABOLIC PANEL
AST: 14
Albumin: 3 — ABNORMAL LOW
BUN: 6
Calcium: 8.5
Chloride: 97
Creatinine, Ser: 0.52
GFR calc Af Amer: >60
Total Bilirubin: 1.3 — ABNORMAL HIGH

## 2011-03-25 LAB — CULTURE, ROUTINE-ABSCESS

## 2011-03-25 LAB — DIFFERENTIAL
Basophils Relative: 0
Eosinophils Relative: 1
Lymphs Abs: 2.4
Monocytes Absolute: 1.5 — ABNORMAL HIGH
Monocytes Relative: 10
Neutro Abs: 11.1 — ABNORMAL HIGH

## 2011-03-25 LAB — HEMOGLOBIN AND HEMATOCRIT, BLOOD: HCT: 30.7 — ABNORMAL LOW

## 2011-04-05 ENCOUNTER — Encounter (HOSPITAL_COMMUNITY)
Admission: RE | Admit: 2011-04-05 | Discharge: 2011-04-05 | Disposition: A | Discharge status: Home / Self Care | Payer: Medicare Other | Source: Ambulatory Visit | Attending: Internal Medicine | Admitting: Internal Medicine

## 2011-04-05 DIAGNOSIS — M069 Rheumatoid arthritis, unspecified: Secondary | ICD-10-CM | POA: Insufficient documentation

## 2011-05-06 ENCOUNTER — Other Ambulatory Visit (HOSPITAL_COMMUNITY): Payer: Self-pay | Admitting: *Deleted

## 2011-05-07 ENCOUNTER — Encounter (HOSPITAL_COMMUNITY): Payer: Medicare Other

## 2011-05-15 ENCOUNTER — Telehealth: Payer: Self-pay | Admitting: *Deleted

## 2011-05-15 NOTE — Telephone Encounter (Signed)
patient confirmed over the phone the new date and time of the 2013 appointment

## 2011-06-14 ENCOUNTER — Encounter (HOSPITAL_COMMUNITY): Payer: Medicare Other

## 2011-06-17 ENCOUNTER — Other Ambulatory Visit (HOSPITAL_COMMUNITY): Payer: Self-pay | Admitting: *Deleted

## 2011-06-17 MED ORDER — SODIUM CHLORIDE 0.9 % IV SOLN: 1000.0000 mg | INTRAVENOUS | Status: DC

## 2011-06-20 ENCOUNTER — Encounter (HOSPITAL_COMMUNITY)
Admission: RE | Admit: 2011-06-20 | Discharge: 2011-06-20 | Disposition: A | Discharge status: Home / Self Care | Payer: Medicare Other | Source: Ambulatory Visit | Attending: Internal Medicine | Admitting: Internal Medicine

## 2011-06-20 DIAGNOSIS — M069 Rheumatoid arthritis, unspecified: Secondary | ICD-10-CM | POA: Insufficient documentation

## 2011-06-20 MED ORDER — SODIUM CHLORIDE 0.9 % IV SOLN
1000.0000 mg | INTRAVENOUS | Status: DC
  Administered 2011-06-20: 1000 mg via INTRAVENOUS
  Filled 2011-06-20: qty 40

## 2011-07-08 ENCOUNTER — Encounter: Payer: Self-pay | Admitting: *Deleted

## 2011-07-11 DIAGNOSIS — D4819 Other specified neoplasm of uncertain behavior of connective and other soft tissue: Secondary | ICD-10-CM | POA: Diagnosis not present

## 2011-07-11 DIAGNOSIS — M19049 Primary osteoarthritis, unspecified hand: Secondary | ICD-10-CM | POA: Diagnosis not present

## 2011-07-11 DIAGNOSIS — D481 Neoplasm of uncertain behavior of connective and other soft tissue: Secondary | ICD-10-CM | POA: Diagnosis not present

## 2011-07-11 DIAGNOSIS — G5600 Carpal tunnel syndrome, unspecified upper limb: Secondary | ICD-10-CM | POA: Diagnosis not present

## 2011-07-16 DIAGNOSIS — I10 Essential (primary) hypertension: Secondary | ICD-10-CM | POA: Diagnosis not present

## 2011-07-18 ENCOUNTER — Encounter (HOSPITAL_COMMUNITY)
Admission: RE | Admit: 2011-07-18 | Discharge: 2011-07-18 | Disposition: A | Discharge status: Home / Self Care | Payer: Medicare Other | Source: Ambulatory Visit | Attending: Internal Medicine | Admitting: Internal Medicine

## 2011-07-18 DIAGNOSIS — M069 Rheumatoid arthritis, unspecified: Secondary | ICD-10-CM | POA: Insufficient documentation

## 2011-07-18 MED ORDER — SODIUM CHLORIDE 0.9 % IV SOLN
1000.0000 mg | INTRAVENOUS | Status: DC
  Administered 2011-07-18: 1000 mg via INTRAVENOUS
  Filled 2011-07-18: qty 40

## 2011-07-22 ENCOUNTER — Other Ambulatory Visit: Payer: Self-pay | Admitting: Oncology

## 2011-07-23 ENCOUNTER — Telehealth: Payer: Self-pay | Admitting: Oncology

## 2011-07-23 ENCOUNTER — Ambulatory Visit (HOSPITAL_BASED_OUTPATIENT_CLINIC_OR_DEPARTMENT_OTHER): Payer: Medicare Other | Admitting: Oncology

## 2011-07-23 ENCOUNTER — Other Ambulatory Visit: Payer: Medicare Other | Admitting: Lab

## 2011-07-23 ENCOUNTER — Other Ambulatory Visit: Payer: Self-pay | Admitting: Oncology

## 2011-07-23 VITALS — BP 176/94 | HR 104 | Temp 98.9°F | Ht 63.0 in | Wt 252.3 lb

## 2011-07-23 DIAGNOSIS — R5383 Other fatigue: Secondary | ICD-10-CM | POA: Diagnosis not present

## 2011-07-23 DIAGNOSIS — D649 Anemia, unspecified: Secondary | ICD-10-CM

## 2011-07-23 DIAGNOSIS — Z23 Encounter for immunization: Secondary | ICD-10-CM | POA: Diagnosis not present

## 2011-07-23 DIAGNOSIS — E782 Mixed hyperlipidemia: Secondary | ICD-10-CM | POA: Diagnosis not present

## 2011-07-23 DIAGNOSIS — R5381 Other malaise: Secondary | ICD-10-CM | POA: Diagnosis not present

## 2011-07-23 DIAGNOSIS — IMO0001 Reserved for inherently not codable concepts without codable children: Secondary | ICD-10-CM | POA: Diagnosis not present

## 2011-07-23 DIAGNOSIS — D561 Beta thalassemia: Secondary | ICD-10-CM

## 2011-07-23 DIAGNOSIS — M069 Rheumatoid arthritis, unspecified: Secondary | ICD-10-CM

## 2011-07-23 DIAGNOSIS — N920 Excessive and frequent menstruation with regular cycle: Secondary | ICD-10-CM | POA: Diagnosis not present

## 2011-07-23 DIAGNOSIS — H908 Mixed conductive and sensorineural hearing loss, unspecified: Secondary | ICD-10-CM | POA: Diagnosis not present

## 2011-07-23 DIAGNOSIS — E119 Type 2 diabetes mellitus without complications: Secondary | ICD-10-CM | POA: Diagnosis not present

## 2011-07-23 DIAGNOSIS — E109 Type 1 diabetes mellitus without complications: Secondary | ICD-10-CM | POA: Diagnosis not present

## 2011-07-23 DIAGNOSIS — I10 Essential (primary) hypertension: Secondary | ICD-10-CM | POA: Diagnosis not present

## 2011-07-23 LAB — CBC WITH DIFFERENTIAL/PLATELET
BASO%: 0.2 % (ref 0.0–2.0)
LYMPH%: 40.1 % (ref 14.0–49.7)
MCHC: 31.6 g/dL (ref 31.5–36.0)
MONO#: 0.8 10*3/uL (ref 0.1–0.9)
MONO%: 10.5 % (ref 0.0–14.0)
Platelets: 375 10*3/uL (ref 145–400)
RBC: 4.86 10*6/uL (ref 3.70–5.45)
WBC: 7.2 10*3/uL (ref 3.9–10.3)

## 2011-07-23 LAB — IRON AND TIBC
%SAT: 19 % — ABNORMAL LOW (ref 20–55)
Iron: 61 ug/dL (ref 42–145)
TIBC: 322 ug/dL (ref 250–470)
UIBC: 261 ug/dL (ref 125–400)

## 2011-07-23 NOTE — Telephone Encounter (Signed)
appt made and printed for 01/14/12  aom

## 2011-07-23 NOTE — Progress Notes (Signed)
Hematology and Oncology Follow Up Visit  Jessica Howard [id number] 19-Sep-1960 51 y.o. 07/23/2011 9:14 AM  CC: Georgianne Fick, M.D.  Lemmie Evens, M.D.  Principle Diagnosis: This is a 51 year old female with microcytic anemia due to beta thalassemia.  She has element of iron deficiency due to heavy menstrual bleeding.   Interim History:  [name] presents today for a follow-up visit.  She is a very pleasant 51 year old female with multiple comorbid conditions that include diabetes, rheumatoid arthritis and as mentioned mild anemia. She has reported really no major changes in her performance status and activity level.  She is slightly a little bit fatigue.  She does have diffuse pain all over, again related to her rheumatoid arthritis.  She is still continuing to have heavy menstrual cycles lasting five days with clots at times.  Otherwise, she has continued to attend activity of daily living without any hindrance or decline.  She has not reported any chest pain.  She has not reported any exertional dyspnea. Her Iron studies in 01/2011 were normal.  Medications: I have reviewed the patient's current medications. Current outpatient prescriptions:gabapentin (NEURONTIN) 100 MG capsule, Take 100 mg by mouth 3 (three) times daily., Disp: , Rfl: ;  HYDROcodone-acetaminophen (VICODIN) 5-500 MG per tablet, Take 1 tablet by mouth every 6 (six) hours as needed., Disp: , Rfl: ;  insulin lispro (HUMALOG) 100 UNIT/ML injection, Inject 20 Units into the skin 3 (three) times daily before meals., Disp: , Rfl:  insulin NPH (HUMULIN N,NOVOLIN N) 100 UNIT/ML injection, Inject into the skin as directed., Disp: , Rfl: ;  predniSONE (DELTASONE) 5 MG tablet, Take 5 mg by mouth daily., Disp: , Rfl: ;  rosuvastatin (CRESTOR) 10 MG tablet, Take 10 mg by mouth daily., Disp: , Rfl: ;  VITAMIN D, CHOLECALCIFEROL, PO, Take by mouth., Disp: , Rfl:   Allergies:  Allergies  Allergen Reactions  . Cefuroxime Axetil   .  Iohexol      Code: RASH, Desc: HAD ITCHING AND A RASH ABOUT ONE HOUR AFTER RETURNING HOME FROM THE CT, Onset Date: [id number]   . Penicillins   . Sulfa Antibiotics     Past Medical History, Surgical history, Social history, and Family History were reviewed and updated.  Review of Systems: Constitutional:  Negative for fever, chills, night sweats, anorexia, weight loss, pain. Cardiovascular: no chest pain or dyspnea on exertion Respiratory: no cough, shortness of breath, or wheezing Neurological: no TIA or stroke symptoms Dermatological: negative ENT: negative Skin: Negative. Gastrointestinal: no abdominal pain, change in bowel habits, or black or bloody stools Genito-Urinary: no dysuria, trouble voiding, or hematuria Hematological and Lymphatic: negative Breast: negative Musculoskeletal: negative Remaining ROS negative. Physical Exam: Blood pressure 176/94, pulse 104, temperature 98.9 F (37.2 C), temperature source Oral, height 5\' 3"  (1.6 m), weight 252 lb 4.8 oz (114.443 kg). ECOG: 1 General appearance: alert Head: Normocephalic, without obvious abnormality, atraumatic Neck: no adenopathy, no carotid bruit, no JVD, supple, symmetrical, trachea midline and thyroid not enlarged, symmetric, no tenderness/mass/nodules Lymph nodes: Cervical, supraclavicular, and axillary nodes normal. Heart:regular rate and rhythm, S1, S2 normal, no murmur, click, rub or gallop Lung:chest clear, no wheezing, rales, normal symmetric air entry Abdomin: soft, non-tender, without masses or organomegaly EXT:no erythema, induration, or nodules   Lab Results: Lab Results  Component Value Date   WBC 7.2 07/23/2011   HGB 10.3* 07/23/2011   HCT 32.7* 07/23/2011   MCV 67.3* 07/23/2011   PLT 375 07/23/2011     Chemistry  Component Value Date/Time   NA 137 01/09/2010 0911   K 3.9 01/09/2010 0911   CL 103 01/09/2010 0911   CO2 22 01/09/2010 0911   BUN 14 01/09/2010 0911   CREATININE 0.67 01/09/2010 0911        Component Value Date/Time   CALCIUM 9.0 01/09/2010 0911   ALKPHOS 61 01/09/2010 0911   AST 12 01/09/2010 0911   ALT 16 01/09/2010 0911   BILITOT 0.4 01/09/2010 0911      Impression and Plan:  A 51 year old female with the following issues: 1. Microcytic anemia related to her beta thalassemia.  I am checking her iron studies today to rule out any iron deficiency component of that.  I have talked to her about IV iron infusion today in the form of Feraheme.  We will set that up for her if her iron stores are depleted due to menorrhagia. 2. Rheumatoid arthritis.  She is followed by Dr. Jimmy Footman.  She has continued to have change from Humira to a newer agent at this time.  She is tolerating it well, although it was too soon to see any effect on that. 3. Followup will be in six months' time  [facility], MD 2/12/20139:14 AM

## 2011-07-30 DIAGNOSIS — R5381 Other malaise: Secondary | ICD-10-CM | POA: Diagnosis not present

## 2011-07-30 DIAGNOSIS — J069 Acute upper respiratory infection, unspecified: Secondary | ICD-10-CM | POA: Diagnosis not present

## 2011-07-30 DIAGNOSIS — R0602 Shortness of breath: Secondary | ICD-10-CM | POA: Diagnosis not present

## 2011-08-02 DIAGNOSIS — M069 Rheumatoid arthritis, unspecified: Secondary | ICD-10-CM | POA: Diagnosis not present

## 2011-08-02 DIAGNOSIS — M19049 Primary osteoarthritis, unspecified hand: Secondary | ICD-10-CM | POA: Diagnosis not present

## 2011-08-02 DIAGNOSIS — M654 Radial styloid tenosynovitis [de Quervain]: Secondary | ICD-10-CM | POA: Diagnosis not present

## 2011-08-02 DIAGNOSIS — D2110 Benign neoplasm of connective and other soft tissue of unspecified upper limb, including shoulder: Secondary | ICD-10-CM | POA: Diagnosis not present

## 2011-08-02 DIAGNOSIS — M659 Synovitis and tenosynovitis, unspecified: Secondary | ICD-10-CM | POA: Diagnosis not present

## 2011-08-08 DIAGNOSIS — M19049 Primary osteoarthritis, unspecified hand: Secondary | ICD-10-CM | POA: Diagnosis not present

## 2011-08-13 ENCOUNTER — Other Ambulatory Visit (HOSPITAL_COMMUNITY): Payer: Self-pay | Admitting: *Deleted

## 2011-08-15 ENCOUNTER — Encounter (HOSPITAL_COMMUNITY): Payer: Medicare Other

## 2011-08-29 DIAGNOSIS — M069 Rheumatoid arthritis, unspecified: Secondary | ICD-10-CM | POA: Diagnosis not present

## 2011-08-30 ENCOUNTER — Encounter (HOSPITAL_COMMUNITY)
Admission: RE | Admit: 2011-08-30 | Discharge: 2011-08-30 | Disposition: A | Discharge status: Home / Self Care | Payer: Medicare Other | Source: Ambulatory Visit | Attending: Internal Medicine | Admitting: Internal Medicine

## 2011-08-30 DIAGNOSIS — M069 Rheumatoid arthritis, unspecified: Secondary | ICD-10-CM | POA: Diagnosis not present

## 2011-08-30 MED ORDER — ABATACEPT 250 MG IV SOLR
1000.0000 mg | INTRAVENOUS | Status: DC
  Administered 2011-08-30: 1000 mg via INTRAVENOUS
  Filled 2011-08-30: qty 40

## 2011-08-30 MED ORDER — SODIUM CHLORIDE 0.9 % IV SOLN
INTRAVENOUS | Status: DC
  Administered 2011-08-30: 11:00:00 via INTRAVENOUS

## 2011-09-10 DIAGNOSIS — R21 Rash and other nonspecific skin eruption: Secondary | ICD-10-CM | POA: Diagnosis not present

## 2011-09-10 DIAGNOSIS — E1065 Type 1 diabetes mellitus with hyperglycemia: Secondary | ICD-10-CM | POA: Diagnosis not present

## 2011-09-10 DIAGNOSIS — I10 Essential (primary) hypertension: Secondary | ICD-10-CM | POA: Diagnosis not present

## 2011-09-10 DIAGNOSIS — IMO0001 Reserved for inherently not codable concepts without codable children: Secondary | ICD-10-CM | POA: Diagnosis not present

## 2011-09-12 DIAGNOSIS — G5600 Carpal tunnel syndrome, unspecified upper limb: Secondary | ICD-10-CM | POA: Diagnosis not present

## 2011-09-12 DIAGNOSIS — M19049 Primary osteoarthritis, unspecified hand: Secondary | ICD-10-CM | POA: Diagnosis not present

## 2011-09-16 DIAGNOSIS — M19049 Primary osteoarthritis, unspecified hand: Secondary | ICD-10-CM | POA: Diagnosis not present

## 2011-09-16 DIAGNOSIS — G5600 Carpal tunnel syndrome, unspecified upper limb: Secondary | ICD-10-CM | POA: Diagnosis not present

## 2011-09-18 DIAGNOSIS — M19049 Primary osteoarthritis, unspecified hand: Secondary | ICD-10-CM | POA: Diagnosis not present

## 2011-09-18 DIAGNOSIS — G5600 Carpal tunnel syndrome, unspecified upper limb: Secondary | ICD-10-CM | POA: Diagnosis not present

## 2011-09-19 DIAGNOSIS — R0989 Other specified symptoms and signs involving the circulatory and respiratory systems: Secondary | ICD-10-CM | POA: Diagnosis not present

## 2011-09-19 DIAGNOSIS — E7800 Pure hypercholesterolemia, unspecified: Secondary | ICD-10-CM | POA: Diagnosis not present

## 2011-09-19 DIAGNOSIS — R0609 Other forms of dyspnea: Secondary | ICD-10-CM | POA: Diagnosis not present

## 2011-09-19 DIAGNOSIS — I10 Essential (primary) hypertension: Secondary | ICD-10-CM | POA: Diagnosis not present

## 2011-09-19 DIAGNOSIS — IMO0001 Reserved for inherently not codable concepts without codable children: Secondary | ICD-10-CM | POA: Diagnosis not present

## 2011-09-19 DIAGNOSIS — G609 Hereditary and idiopathic neuropathy, unspecified: Secondary | ICD-10-CM | POA: Diagnosis not present

## 2011-09-19 DIAGNOSIS — E1065 Type 1 diabetes mellitus with hyperglycemia: Secondary | ICD-10-CM | POA: Diagnosis not present

## 2011-09-24 DIAGNOSIS — G5600 Carpal tunnel syndrome, unspecified upper limb: Secondary | ICD-10-CM | POA: Diagnosis not present

## 2011-09-24 DIAGNOSIS — M19049 Primary osteoarthritis, unspecified hand: Secondary | ICD-10-CM | POA: Diagnosis not present

## 2011-09-26 DIAGNOSIS — G4730 Sleep apnea, unspecified: Secondary | ICD-10-CM | POA: Diagnosis not present

## 2011-09-26 DIAGNOSIS — R0602 Shortness of breath: Secondary | ICD-10-CM | POA: Diagnosis not present

## 2011-09-26 DIAGNOSIS — M069 Rheumatoid arthritis, unspecified: Secondary | ICD-10-CM | POA: Diagnosis not present

## 2011-09-26 DIAGNOSIS — I10 Essential (primary) hypertension: Secondary | ICD-10-CM | POA: Diagnosis not present

## 2011-10-01 DIAGNOSIS — M19049 Primary osteoarthritis, unspecified hand: Secondary | ICD-10-CM | POA: Diagnosis not present

## 2011-10-02 ENCOUNTER — Encounter (HOSPITAL_COMMUNITY)
Admission: RE | Admit: 2011-10-02 | Discharge: 2011-10-02 | Disposition: A | Discharge status: Home / Self Care | Payer: Medicare Other | Source: Ambulatory Visit | Attending: Internal Medicine | Admitting: Internal Medicine

## 2011-10-02 DIAGNOSIS — M069 Rheumatoid arthritis, unspecified: Secondary | ICD-10-CM | POA: Insufficient documentation

## 2011-10-02 MED ORDER — SODIUM CHLORIDE 0.9 % IV SOLN
1000.0000 mg | INTRAVENOUS | Status: AC
  Administered 2011-10-02: 1000 mg via INTRAVENOUS
  Filled 2011-10-02: qty 40

## 2011-10-02 MED ORDER — SODIUM CHLORIDE 0.9 % IV SOLN
INTRAVENOUS | Status: AC
  Administered 2011-10-02: 10:00:00 via INTRAVENOUS

## 2011-10-07 DIAGNOSIS — R0602 Shortness of breath: Secondary | ICD-10-CM | POA: Diagnosis not present

## 2011-10-07 DIAGNOSIS — R0989 Other specified symptoms and signs involving the circulatory and respiratory systems: Secondary | ICD-10-CM | POA: Diagnosis not present

## 2011-10-10 DIAGNOSIS — M19049 Primary osteoarthritis, unspecified hand: Secondary | ICD-10-CM | POA: Diagnosis not present

## 2011-10-16 DIAGNOSIS — L708 Other acne: Secondary | ICD-10-CM | POA: Diagnosis not present

## 2011-10-30 ENCOUNTER — Encounter (HOSPITAL_COMMUNITY)
Admission: RE | Admit: 2011-10-30 | Discharge: 2011-10-30 | Disposition: A | Discharge status: Home / Self Care | Payer: Medicare Other | Source: Ambulatory Visit | Attending: Internal Medicine | Admitting: Internal Medicine

## 2011-10-30 DIAGNOSIS — M069 Rheumatoid arthritis, unspecified: Secondary | ICD-10-CM | POA: Diagnosis not present

## 2011-10-30 MED ORDER — SODIUM CHLORIDE 0.9 % IV SOLN
1000.0000 mg | INTRAVENOUS | Status: DC
  Administered 2011-10-30: 1000 mg via INTRAVENOUS
  Filled 2011-10-30: qty 40

## 2011-10-30 MED ORDER — SODIUM CHLORIDE 0.9 % IV SOLN
INTRAVENOUS | Status: DC
  Administered 2011-10-30: 250 mL via INTRAVENOUS

## 2011-11-27 ENCOUNTER — Encounter (HOSPITAL_COMMUNITY): Payer: Medicare Other

## 2011-12-31 DIAGNOSIS — M79609 Pain in unspecified limb: Secondary | ICD-10-CM | POA: Diagnosis not present

## 2011-12-31 DIAGNOSIS — M069 Rheumatoid arthritis, unspecified: Secondary | ICD-10-CM | POA: Diagnosis not present

## 2012-01-01 ENCOUNTER — Encounter (HOSPITAL_COMMUNITY)
Admission: RE | Admit: 2012-01-01 | Discharge: 2012-01-01 | Disposition: A | Discharge status: Home / Self Care | Payer: Medicare Other | Source: Ambulatory Visit | Attending: Internal Medicine | Admitting: Internal Medicine

## 2012-01-01 DIAGNOSIS — M069 Rheumatoid arthritis, unspecified: Secondary | ICD-10-CM | POA: Insufficient documentation

## 2012-01-01 MED ORDER — SODIUM CHLORIDE 0.9 % IV SOLN
INTRAVENOUS | Status: DC
  Administered 2012-01-01: 250 mL via INTRAVENOUS

## 2012-01-01 MED ORDER — SODIUM CHLORIDE 0.9 % IV SOLN
1000.0000 mg | INTRAVENOUS | Status: DC
  Administered 2012-01-01: 1000 mg via INTRAVENOUS
  Filled 2012-01-01: qty 40

## 2012-01-13 ENCOUNTER — Telehealth: Payer: Self-pay | Admitting: Oncology

## 2012-01-13 DIAGNOSIS — H40059 Ocular hypertension, unspecified eye: Secondary | ICD-10-CM | POA: Diagnosis not present

## 2012-01-13 DIAGNOSIS — E11339 Type 2 diabetes mellitus with moderate nonproliferative diabetic retinopathy without macular edema: Secondary | ICD-10-CM | POA: Diagnosis not present

## 2012-01-13 NOTE — Telephone Encounter (Signed)
Returned pt's call re cx 8/6 appt. Pt cancelled and will call back to r/s.

## 2012-01-14 ENCOUNTER — Other Ambulatory Visit: Payer: Medicare Other | Admitting: Lab

## 2012-01-14 ENCOUNTER — Telehealth: Payer: Self-pay | Admitting: Oncology

## 2012-01-14 ENCOUNTER — Ambulatory Visit: Payer: Medicare Other | Admitting: Oncology

## 2012-01-14 DIAGNOSIS — M19049 Primary osteoarthritis, unspecified hand: Secondary | ICD-10-CM | POA: Diagnosis not present

## 2012-01-14 NOTE — Telephone Encounter (Signed)
Pt called today to r/s 8/5 appt to 9/5 @ 9 am.

## 2012-01-22 DIAGNOSIS — H40059 Ocular hypertension, unspecified eye: Secondary | ICD-10-CM | POA: Diagnosis not present

## 2012-01-27 DIAGNOSIS — D481 Neoplasm of uncertain behavior of connective and other soft tissue: Secondary | ICD-10-CM | POA: Diagnosis not present

## 2012-01-27 DIAGNOSIS — L988 Other specified disorders of the skin and subcutaneous tissue: Secondary | ICD-10-CM | POA: Diagnosis not present

## 2012-01-27 DIAGNOSIS — L98499 Non-pressure chronic ulcer of skin of other sites with unspecified severity: Secondary | ICD-10-CM | POA: Diagnosis not present

## 2012-01-29 ENCOUNTER — Encounter (HOSPITAL_COMMUNITY): Payer: Medicare Other

## 2012-02-13 ENCOUNTER — Ambulatory Visit (HOSPITAL_BASED_OUTPATIENT_CLINIC_OR_DEPARTMENT_OTHER): Payer: Medicare Other | Admitting: Oncology

## 2012-02-13 ENCOUNTER — Other Ambulatory Visit (HOSPITAL_BASED_OUTPATIENT_CLINIC_OR_DEPARTMENT_OTHER): Payer: Medicare Other | Admitting: Lab

## 2012-02-13 ENCOUNTER — Telehealth: Payer: Self-pay | Admitting: Oncology

## 2012-02-13 VITALS — BP 146/84 | HR 82 | Temp 98.4°F | Resp 20 | Ht 63.0 in | Wt 238.5 lb

## 2012-02-13 DIAGNOSIS — D649 Anemia, unspecified: Secondary | ICD-10-CM

## 2012-02-13 DIAGNOSIS — D509 Iron deficiency anemia, unspecified: Secondary | ICD-10-CM | POA: Diagnosis not present

## 2012-02-13 DIAGNOSIS — D569 Thalassemia, unspecified: Secondary | ICD-10-CM

## 2012-02-13 LAB — CBC WITH DIFFERENTIAL/PLATELET
Basophils Absolute: 0.1 10*3/uL (ref 0.0–0.1)
EOS%: 1.8 % (ref 0.0–7.0)
HCT: 35.5 % (ref 34.8–46.6)
HGB: 11.4 g/dL — ABNORMAL LOW (ref 11.6–15.9)
LYMPH%: 36.1 % (ref 14.0–49.7)
MCH: 21.1 pg — ABNORMAL LOW (ref 25.1–34.0)
MCV: 65.6 fL — ABNORMAL LOW (ref 79.5–101.0)
MONO%: 9.4 % (ref 0.0–14.0)
NEUT%: 52 % (ref 38.4–76.8)

## 2012-02-13 LAB — COMPREHENSIVE METABOLIC PANEL (CC13)
Albumin: 3.4 g/dL — ABNORMAL LOW (ref 3.5–5.0)
CO2: 21 mEq/L — ABNORMAL LOW (ref 22–29)
Glucose: 529 mg/dl — ABNORMAL HIGH (ref 70–99)
Potassium: 3.9 mEq/L (ref 3.5–5.1)
Sodium: 133 mEq/L — ABNORMAL LOW (ref 136–145)
Total Protein: 7 g/dL (ref 6.4–8.3)

## 2012-02-13 NOTE — Progress Notes (Signed)
Hematology and Oncology Follow Up Visit  Jessica Howard [id number] 1960-11-10 51 y.o. 02/13/2012 9:22 AM  CC: Jessica Howard, M.D.  Lemmie Evens, M.D.  Principle Diagnosis: This is a 51 year old female with microcytic anemia due to beta thalassemia.  She has element of iron deficiency due to heavy menstrual bleeding.  Interim History:  [name] presents today for a follow-up visit.  She is a very pleasant 51 year old female with multiple comorbid conditions that include diabetes, rheumatoid arthritis and as mentioned mild anemia. She has reported really no major changes in her performance status and activity level.  She is slightly a little bit fatigue.  She does have diffuse pain all over, again related to her rheumatoid arthritis.  She is still continuing to have heavy menstrual cycles lasting five days with clots at times.  Otherwise, she has continued to attend activity of daily living without any hindrance or decline.  She has not reported any chest pain.  She has not reported any exertional dyspnea. No changes since her last visit.   Medications: I have reviewed the patient's current medications. Current outpatient prescriptions:abatacept (ORENCIA) 250 MG injection, Inject into the vein. No dose given, Disp: , Rfl: ;  amitriptyline (ELAVIL) 25 MG tablet, Take 25 mg by mouth at bedtime., Disp: , Rfl: ;  diltiazem (TIAZAC) 180 MG 24 hr capsule, Take 180 mg by mouth daily., Disp: , Rfl: ;  gabapentin (NEURONTIN) 100 MG capsule, Take 100 mg by mouth 3 (three) times daily., Disp: , Rfl:  hydrochlorothiazide (HYDRODIURIL) 25 MG tablet, Take 25 mg by mouth daily., Disp: , Rfl: ;  HYDROcodone-acetaminophen (VICODIN) 5-500 MG per tablet, Take 1 tablet by mouth every 6 (six) hours as needed., Disp: , Rfl: ;  insulin lispro (HUMALOG) 100 UNIT/ML injection, Inject 20 Units into the skin 3 (three) times daily before meals., Disp: , Rfl:  insulin NPH (HUMULIN N,NOVOLIN N) 100 UNIT/ML injection,  Inject into the skin as directed., Disp: , Rfl: ;  irbesartan (AVAPRO) 300 MG tablet, Take 300 mg by mouth at bedtime., Disp: , Rfl: ;  leflunomide (ARAVA) 20 MG tablet, Take 20 mg by mouth daily., Disp: , Rfl: ;  Nebivolol HCl (BYSTOLIC) 20 MG TABS, Take 20 mg by mouth daily., Disp: , Rfl: ;  pantoprazole (PROTONIX) 40 MG tablet, Take 40 mg by mouth daily., Disp: , Rfl:  predniSONE (DELTASONE) 5 MG tablet, Take 5 mg by mouth daily., Disp: , Rfl: ;  rosuvastatin (CRESTOR) 10 MG tablet, Take 10 mg by mouth daily., Disp: , Rfl: ;  VITAMIN D, CHOLECALCIFEROL, PO, Take by mouth., Disp: , Rfl:   Allergies:  Allergies  Allergen Reactions  . Cefuroxime Axetil   . Cephalosporins Itching  . Iohexol      Code: RASH, Desc: HAD ITCHING AND A RASH ABOUT ONE HOUR AFTER RETURNING HOME FROM THE CT, Onset Date: [id number]   . Penicillins   . Sulfa Antibiotics     Past Medical History, Surgical history, Social history, and Family History were reviewed and updated.  Review of Systems: Constitutional:  Negative for fever, chills, night sweats, anorexia, weight loss, pain. Cardiovascular: no chest pain or dyspnea on exertion Respiratory: no cough, shortness of breath, or wheezing Neurological: no TIA or stroke symptoms Dermatological: negative ENT: negative Skin: Negative. Gastrointestinal: no abdominal pain, change in bowel habits, or black or bloody stools Genito-Urinary: no dysuria, trouble voiding, or hematuria Hematological and Lymphatic: negative Breast: negative Musculoskeletal: negative Remaining ROS negative. Physical Exam: Blood pressure 146/84,  pulse 82, temperature 98.4 F (36.9 C), temperature source Oral, resp. rate 20, height 5\' 3"  (1.6 m), weight 238 lb 8 oz (108.183 kg). ECOG: 1 General appearance: alert Head: Normocephalic, without obvious abnormality, atraumatic Neck: no adenopathy, no carotid bruit, no JVD, supple, symmetrical, trachea midline and thyroid not enlarged, symmetric, no  tenderness/mass/nodules Lymph nodes: Cervical, supraclavicular, and axillary nodes normal. Heart:regular rate and rhythm, S1, S2 normal, no murmur, click, rub or gallop Lung:chest clear, no wheezing, rales, normal symmetric air entry Abdomin: soft, non-tender, without masses or organomegaly EXT:no erythema, induration, or nodules   Lab Results: Lab Results  Component Value Date   WBC 8.2 02/13/2012   HGB 11.4* 02/13/2012   HCT 35.5 02/13/2012   MCV 65.6* 02/13/2012   PLT 271 02/13/2012     Chemistry      Component Value Date/Time   NA 137 01/09/2010 0911   K 3.9 01/09/2010 0911   CL 103 01/09/2010 0911   CO2 22 01/09/2010 0911   BUN 14 01/09/2010 0911   CREATININE 0.67 01/09/2010 0911      Component Value Date/Time   CALCIUM 9.0 01/09/2010 0911   ALKPHOS 61 01/09/2010 0911   AST 12 01/09/2010 0911   ALT 16 01/09/2010 0911   BILITOT 0.4 01/09/2010 0911      Impression and Plan:  A 51 year old female with the following issues: 1. Microcytic anemia related to her beta thalassemia.  I am checking her iron studies today to rule out any iron deficiency component of that.  I have talked to her about IV iron infusion today in the form of Feraheme.  We will set that up for her if her iron stores are depleted due to menorrhagia. 2. Rheumatoid arthritis.  She is followed by Dr. Jimmy Footman.  3. Followup will be in six months' time  [facility], MD 9/5/20139:22 AM

## 2012-02-13 NOTE — Telephone Encounter (Signed)
appts made and printed for pt aom °

## 2012-02-26 ENCOUNTER — Encounter (HOSPITAL_COMMUNITY)
Admission: RE | Admit: 2012-02-26 | Discharge: 2012-02-26 | Disposition: A | Discharge status: Home / Self Care | Payer: Medicare Other | Source: Ambulatory Visit | Attending: Internal Medicine | Admitting: Internal Medicine

## 2012-02-26 DIAGNOSIS — M069 Rheumatoid arthritis, unspecified: Secondary | ICD-10-CM | POA: Insufficient documentation

## 2012-02-26 MED ORDER — SODIUM CHLORIDE 0.9 % IV SOLN
INTRAVENOUS | Status: DC
  Administered 2012-02-26: 250 mL via INTRAVENOUS

## 2012-02-26 MED ORDER — SODIUM CHLORIDE 0.9 % IV SOLN
1000.0000 mg | INTRAVENOUS | Status: DC
  Administered 2012-02-26: 1000 mg via INTRAVENOUS
  Filled 2012-02-26: qty 40

## 2012-03-05 DIAGNOSIS — I10 Essential (primary) hypertension: Secondary | ICD-10-CM | POA: Diagnosis not present

## 2012-03-05 DIAGNOSIS — G4733 Obstructive sleep apnea (adult) (pediatric): Secondary | ICD-10-CM | POA: Diagnosis not present

## 2012-03-05 DIAGNOSIS — G4730 Sleep apnea, unspecified: Secondary | ICD-10-CM | POA: Diagnosis not present

## 2012-03-05 DIAGNOSIS — E669 Obesity, unspecified: Secondary | ICD-10-CM | POA: Diagnosis not present

## 2012-03-05 DIAGNOSIS — G4710 Hypersomnia, unspecified: Secondary | ICD-10-CM | POA: Diagnosis not present

## 2012-03-09 ENCOUNTER — Other Ambulatory Visit: Payer: Self-pay | Admitting: Dermatology

## 2012-03-09 DIAGNOSIS — L259 Unspecified contact dermatitis, unspecified cause: Secondary | ICD-10-CM | POA: Diagnosis not present

## 2012-03-09 DIAGNOSIS — L708 Other acne: Secondary | ICD-10-CM | POA: Diagnosis not present

## 2012-03-09 DIAGNOSIS — L988 Other specified disorders of the skin and subcutaneous tissue: Secondary | ICD-10-CM | POA: Diagnosis not present

## 2012-03-09 DIAGNOSIS — D485 Neoplasm of uncertain behavior of skin: Secondary | ICD-10-CM | POA: Diagnosis not present

## 2012-03-19 DIAGNOSIS — G4730 Sleep apnea, unspecified: Secondary | ICD-10-CM | POA: Diagnosis not present

## 2012-03-19 DIAGNOSIS — I10 Essential (primary) hypertension: Secondary | ICD-10-CM | POA: Diagnosis not present

## 2012-03-19 DIAGNOSIS — E669 Obesity, unspecified: Secondary | ICD-10-CM | POA: Diagnosis not present

## 2012-03-19 DIAGNOSIS — G4733 Obstructive sleep apnea (adult) (pediatric): Secondary | ICD-10-CM | POA: Diagnosis not present

## 2012-03-23 DIAGNOSIS — R809 Proteinuria, unspecified: Secondary | ICD-10-CM | POA: Diagnosis not present

## 2012-03-23 DIAGNOSIS — Z23 Encounter for immunization: Secondary | ICD-10-CM | POA: Diagnosis not present

## 2012-03-23 DIAGNOSIS — I10 Essential (primary) hypertension: Secondary | ICD-10-CM | POA: Diagnosis not present

## 2012-03-23 DIAGNOSIS — E1049 Type 1 diabetes mellitus with other diabetic neurological complication: Secondary | ICD-10-CM | POA: Diagnosis not present

## 2012-03-23 DIAGNOSIS — E7800 Pure hypercholesterolemia, unspecified: Secondary | ICD-10-CM | POA: Diagnosis not present

## 2012-03-23 DIAGNOSIS — G609 Hereditary and idiopathic neuropathy, unspecified: Secondary | ICD-10-CM | POA: Diagnosis not present

## 2012-03-25 ENCOUNTER — Encounter (HOSPITAL_COMMUNITY): Payer: Medicare Other

## 2012-04-01 ENCOUNTER — Encounter (HOSPITAL_COMMUNITY)
Admission: RE | Admit: 2012-04-01 | Discharge: 2012-04-01 | Disposition: A | Discharge status: Home / Self Care | Payer: Medicare Other | Source: Ambulatory Visit | Attending: Internal Medicine | Admitting: Internal Medicine

## 2012-04-01 DIAGNOSIS — M069 Rheumatoid arthritis, unspecified: Secondary | ICD-10-CM | POA: Diagnosis not present

## 2012-04-01 MED ORDER — SODIUM CHLORIDE 0.9 % IV SOLN
INTRAVENOUS | Status: DC
  Administered 2012-04-01: 09:00:00 via INTRAVENOUS

## 2012-04-01 MED ORDER — SODIUM CHLORIDE 0.9 % IV SOLN
1000.0000 mg | INTRAVENOUS | Status: DC
  Administered 2012-04-01: 1000 mg via INTRAVENOUS
  Filled 2012-04-01: qty 40

## 2012-04-07 DIAGNOSIS — M79609 Pain in unspecified limb: Secondary | ICD-10-CM | POA: Diagnosis not present

## 2012-04-07 DIAGNOSIS — M069 Rheumatoid arthritis, unspecified: Secondary | ICD-10-CM | POA: Diagnosis not present

## 2012-04-16 DIAGNOSIS — M069 Rheumatoid arthritis, unspecified: Secondary | ICD-10-CM | POA: Diagnosis not present

## 2012-04-16 DIAGNOSIS — E1065 Type 1 diabetes mellitus with hyperglycemia: Secondary | ICD-10-CM | POA: Diagnosis not present

## 2012-04-16 DIAGNOSIS — I10 Essential (primary) hypertension: Secondary | ICD-10-CM | POA: Diagnosis not present

## 2012-04-29 ENCOUNTER — Encounter (HOSPITAL_COMMUNITY)
Admission: RE | Admit: 2012-04-29 | Discharge: 2012-04-29 | Disposition: A | Discharge status: Home / Self Care | Payer: Medicare Other | Source: Ambulatory Visit | Attending: Internal Medicine | Admitting: Internal Medicine

## 2012-04-29 DIAGNOSIS — M069 Rheumatoid arthritis, unspecified: Secondary | ICD-10-CM | POA: Diagnosis not present

## 2012-04-29 MED ORDER — SODIUM CHLORIDE 0.9 % IV SOLN
INTRAVENOUS | Status: DC
  Administered 2012-04-29: 10:00:00 via INTRAVENOUS

## 2012-04-29 MED ORDER — SODIUM CHLORIDE 0.9 % IV SOLN
1000.0000 mg | INTRAVENOUS | Status: DC
  Administered 2012-04-29: 1000 mg via INTRAVENOUS
  Filled 2012-04-29: qty 40

## 2012-05-11 DIAGNOSIS — I10 Essential (primary) hypertension: Secondary | ICD-10-CM | POA: Diagnosis not present

## 2012-05-15 DIAGNOSIS — R21 Rash and other nonspecific skin eruption: Secondary | ICD-10-CM | POA: Diagnosis not present

## 2012-05-15 DIAGNOSIS — B373 Candidiasis of vulva and vagina: Secondary | ICD-10-CM | POA: Diagnosis not present

## 2012-05-15 DIAGNOSIS — I10 Essential (primary) hypertension: Secondary | ICD-10-CM | POA: Diagnosis not present

## 2012-05-15 DIAGNOSIS — M25569 Pain in unspecified knee: Secondary | ICD-10-CM | POA: Diagnosis not present

## 2012-05-27 ENCOUNTER — Encounter (HOSPITAL_COMMUNITY)
Admission: RE | Admit: 2012-05-27 | Discharge: 2012-05-27 | Disposition: A | Discharge status: Home / Self Care | Payer: Medicare Other | Source: Ambulatory Visit | Attending: Internal Medicine | Admitting: Internal Medicine

## 2012-05-27 DIAGNOSIS — M069 Rheumatoid arthritis, unspecified: Secondary | ICD-10-CM | POA: Insufficient documentation

## 2012-05-27 MED ORDER — SODIUM CHLORIDE 0.9 % IV SOLN
INTRAVENOUS | Status: DC
  Administered 2012-05-27: 09:00:00 via INTRAVENOUS

## 2012-05-27 MED ORDER — SODIUM CHLORIDE 0.9 % IV SOLN
1000.0000 mg | INTRAVENOUS | Status: DC
  Administered 2012-05-27: 1000 mg via INTRAVENOUS
  Filled 2012-05-27: qty 40

## 2012-06-24 ENCOUNTER — Encounter (HOSPITAL_COMMUNITY)
Admission: RE | Admit: 2012-06-24 | Discharge: 2012-06-24 | Disposition: A | Discharge status: Home / Self Care | Payer: Medicare Other | Source: Ambulatory Visit | Attending: Internal Medicine | Admitting: Internal Medicine

## 2012-06-24 DIAGNOSIS — M069 Rheumatoid arthritis, unspecified: Secondary | ICD-10-CM | POA: Insufficient documentation

## 2012-06-24 MED ORDER — SODIUM CHLORIDE 0.9 % IV SOLN
INTRAVENOUS | Status: DC
  Administered 2012-06-24: 250 mL via INTRAVENOUS

## 2012-06-24 MED ORDER — SODIUM CHLORIDE 0.9 % IV SOLN
1000.0000 mg | INTRAVENOUS | Status: DC
  Administered 2012-06-24: 1000 mg via INTRAVENOUS
  Filled 2012-06-24: qty 40

## 2012-07-07 DIAGNOSIS — M25579 Pain in unspecified ankle and joints of unspecified foot: Secondary | ICD-10-CM | POA: Diagnosis not present

## 2012-07-13 DIAGNOSIS — M25579 Pain in unspecified ankle and joints of unspecified foot: Secondary | ICD-10-CM | POA: Diagnosis not present

## 2012-07-15 DIAGNOSIS — L708 Other acne: Secondary | ICD-10-CM | POA: Diagnosis not present

## 2012-07-15 DIAGNOSIS — L439 Lichen planus, unspecified: Secondary | ICD-10-CM | POA: Diagnosis not present

## 2012-07-20 DIAGNOSIS — R079 Chest pain, unspecified: Secondary | ICD-10-CM | POA: Diagnosis not present

## 2012-07-20 DIAGNOSIS — IMO0001 Reserved for inherently not codable concepts without codable children: Secondary | ICD-10-CM | POA: Diagnosis not present

## 2012-07-20 DIAGNOSIS — H6120 Impacted cerumen, unspecified ear: Secondary | ICD-10-CM | POA: Diagnosis not present

## 2012-07-20 DIAGNOSIS — G589 Mononeuropathy, unspecified: Secondary | ICD-10-CM | POA: Diagnosis not present

## 2012-07-20 DIAGNOSIS — M069 Rheumatoid arthritis, unspecified: Secondary | ICD-10-CM | POA: Diagnosis not present

## 2012-07-20 DIAGNOSIS — I10 Essential (primary) hypertension: Secondary | ICD-10-CM | POA: Diagnosis not present

## 2012-07-21 DIAGNOSIS — M25579 Pain in unspecified ankle and joints of unspecified foot: Secondary | ICD-10-CM | POA: Diagnosis not present

## 2012-07-22 ENCOUNTER — Encounter (HOSPITAL_COMMUNITY)
Admission: RE | Admit: 2012-07-22 | Discharge: 2012-07-22 | Disposition: A | Discharge status: Home / Self Care | Payer: Medicare Other | Source: Ambulatory Visit | Attending: Internal Medicine | Admitting: Internal Medicine

## 2012-07-22 DIAGNOSIS — M069 Rheumatoid arthritis, unspecified: Secondary | ICD-10-CM | POA: Diagnosis not present

## 2012-07-22 MED ORDER — SODIUM CHLORIDE 0.9 % IV SOLN
INTRAVENOUS | Status: AC
  Administered 2012-07-22: 10:00:00 via INTRAVENOUS

## 2012-07-22 MED ORDER — SODIUM CHLORIDE 0.9 % IV SOLN
1000.0000 mg | INTRAVENOUS | Status: AC
  Administered 2012-07-22: 1000 mg via INTRAVENOUS
  Filled 2012-07-22: qty 40

## 2012-07-29 DIAGNOSIS — Z0000 Encounter for general adult medical examination without abnormal findings: Secondary | ICD-10-CM | POA: Diagnosis not present

## 2012-07-29 DIAGNOSIS — Z1331 Encounter for screening for depression: Secondary | ICD-10-CM | POA: Diagnosis not present

## 2012-07-29 DIAGNOSIS — E782 Mixed hyperlipidemia: Secondary | ICD-10-CM | POA: Diagnosis not present

## 2012-07-29 DIAGNOSIS — E0590 Thyrotoxicosis, unspecified without thyrotoxic crisis or storm: Secondary | ICD-10-CM | POA: Diagnosis not present

## 2012-07-29 DIAGNOSIS — E1049 Type 1 diabetes mellitus with other diabetic neurological complication: Secondary | ICD-10-CM | POA: Diagnosis not present

## 2012-07-29 DIAGNOSIS — I10 Essential (primary) hypertension: Secondary | ICD-10-CM | POA: Diagnosis not present

## 2012-07-30 DIAGNOSIS — R809 Proteinuria, unspecified: Secondary | ICD-10-CM | POA: Diagnosis not present

## 2012-07-30 DIAGNOSIS — E1065 Type 1 diabetes mellitus with hyperglycemia: Secondary | ICD-10-CM | POA: Diagnosis not present

## 2012-07-30 DIAGNOSIS — E1049 Type 1 diabetes mellitus with other diabetic neurological complication: Secondary | ICD-10-CM | POA: Diagnosis not present

## 2012-07-30 DIAGNOSIS — G589 Mononeuropathy, unspecified: Secondary | ICD-10-CM | POA: Diagnosis not present

## 2012-07-31 DIAGNOSIS — I10 Essential (primary) hypertension: Secondary | ICD-10-CM | POA: Diagnosis not present

## 2012-07-31 DIAGNOSIS — E1065 Type 1 diabetes mellitus with hyperglycemia: Secondary | ICD-10-CM | POA: Diagnosis not present

## 2012-07-31 DIAGNOSIS — E7800 Pure hypercholesterolemia, unspecified: Secondary | ICD-10-CM | POA: Diagnosis not present

## 2012-07-31 DIAGNOSIS — E1049 Type 1 diabetes mellitus with other diabetic neurological complication: Secondary | ICD-10-CM | POA: Diagnosis not present

## 2012-08-05 ENCOUNTER — Other Ambulatory Visit (HOSPITAL_COMMUNITY)
Admission: RE | Admit: 2012-08-05 | Discharge: 2012-08-05 | Disposition: A | Discharge status: Home / Self Care | Payer: Medicare Other | Source: Ambulatory Visit | Attending: Internal Medicine | Admitting: Internal Medicine

## 2012-08-05 ENCOUNTER — Other Ambulatory Visit: Payer: Self-pay | Admitting: Registered Nurse

## 2012-08-05 DIAGNOSIS — Z124 Encounter for screening for malignant neoplasm of cervix: Secondary | ICD-10-CM | POA: Diagnosis not present

## 2012-08-05 DIAGNOSIS — Z01419 Encounter for gynecological examination (general) (routine) without abnormal findings: Secondary | ICD-10-CM | POA: Diagnosis not present

## 2012-08-05 DIAGNOSIS — E2839 Other primary ovarian failure: Secondary | ICD-10-CM | POA: Diagnosis not present

## 2012-08-05 DIAGNOSIS — N924 Excessive bleeding in the premenopausal period: Secondary | ICD-10-CM | POA: Diagnosis not present

## 2012-08-05 DIAGNOSIS — D649 Anemia, unspecified: Secondary | ICD-10-CM | POA: Diagnosis not present

## 2012-08-12 ENCOUNTER — Ambulatory Visit: Payer: Medicare Other | Admitting: Oncology

## 2012-08-12 ENCOUNTER — Other Ambulatory Visit: Payer: Medicare Other | Admitting: Lab

## 2012-08-17 DIAGNOSIS — M25519 Pain in unspecified shoulder: Secondary | ICD-10-CM | POA: Diagnosis not present

## 2012-08-17 DIAGNOSIS — G5710 Meralgia paresthetica, unspecified lower limb: Secondary | ICD-10-CM | POA: Diagnosis not present

## 2012-08-17 DIAGNOSIS — I10 Essential (primary) hypertension: Secondary | ICD-10-CM | POA: Diagnosis not present

## 2012-08-17 DIAGNOSIS — IMO0001 Reserved for inherently not codable concepts without codable children: Secondary | ICD-10-CM | POA: Diagnosis not present

## 2012-08-18 DIAGNOSIS — M25579 Pain in unspecified ankle and joints of unspecified foot: Secondary | ICD-10-CM | POA: Diagnosis not present

## 2012-08-19 ENCOUNTER — Other Ambulatory Visit (HOSPITAL_COMMUNITY): Payer: Self-pay | Admitting: *Deleted

## 2012-08-19 DIAGNOSIS — I8310 Varicose veins of unspecified lower extremity with inflammation: Secondary | ICD-10-CM | POA: Diagnosis not present

## 2012-08-20 ENCOUNTER — Encounter (HOSPITAL_COMMUNITY)
Admission: RE | Admit: 2012-08-20 | Discharge: 2012-08-20 | Disposition: A | Discharge status: Home / Self Care | Payer: Medicare Other | Source: Ambulatory Visit | Attending: Internal Medicine | Admitting: Internal Medicine

## 2012-08-20 DIAGNOSIS — M069 Rheumatoid arthritis, unspecified: Secondary | ICD-10-CM | POA: Diagnosis not present

## 2012-08-20 MED ORDER — SODIUM CHLORIDE 0.9 % IV SOLN
INTRAVENOUS | Status: DC
  Administered 2012-08-20: 09:00:00 via INTRAVENOUS

## 2012-08-20 MED ORDER — SODIUM CHLORIDE 0.9 % IV SOLN
1000.0000 mg | INTRAVENOUS | Status: DC
  Administered 2012-08-20: 1000 mg via INTRAVENOUS
  Filled 2012-08-20: qty 40

## 2012-08-21 DIAGNOSIS — I8310 Varicose veins of unspecified lower extremity with inflammation: Secondary | ICD-10-CM | POA: Diagnosis not present

## 2012-08-24 ENCOUNTER — Encounter: Payer: Medicare Other | Admitting: Obstetrics and Gynecology

## 2012-08-28 ENCOUNTER — Encounter: Payer: Self-pay | Admitting: Obstetrics & Gynecology

## 2012-08-28 ENCOUNTER — Other Ambulatory Visit (HOSPITAL_COMMUNITY)
Admission: RE | Admit: 2012-08-28 | Discharge: 2012-08-28 | Disposition: A | Discharge status: Home / Self Care | Payer: Medicare Other | Source: Ambulatory Visit | Attending: Obstetrics & Gynecology | Admitting: Obstetrics & Gynecology

## 2012-08-28 ENCOUNTER — Ambulatory Visit (INDEPENDENT_AMBULATORY_CARE_PROVIDER_SITE_OTHER): Payer: Medicare Other | Admitting: Obstetrics & Gynecology

## 2012-08-28 VITALS — BP 125/82 | HR 82 | Temp 97.9°F | Wt 247.0 lb

## 2012-08-28 DIAGNOSIS — N950 Postmenopausal bleeding: Secondary | ICD-10-CM | POA: Diagnosis not present

## 2012-08-28 DIAGNOSIS — N925 Other specified irregular menstruation: Secondary | ICD-10-CM | POA: Diagnosis not present

## 2012-08-28 DIAGNOSIS — N938 Other specified abnormal uterine and vaginal bleeding: Secondary | ICD-10-CM | POA: Insufficient documentation

## 2012-08-28 DIAGNOSIS — N949 Unspecified condition associated with female genital organs and menstrual cycle: Secondary | ICD-10-CM | POA: Insufficient documentation

## 2012-08-28 DIAGNOSIS — N920 Excessive and frequent menstruation with regular cycle: Secondary | ICD-10-CM | POA: Diagnosis not present

## 2012-08-28 NOTE — Patient Instructions (Addendum)
Postmenopausal Bleeding Menopause is commonly referred to as the "change in life." It is a time when the fertile years, the time of ovulating and having menstrual periods, has come to an end. It is also determined by not having menstrual periods for 12 months.  Postmenopausal bleeding is any bleeding a woman has after she has entered into menopause. Any type of postmenopausal bleeding, even if it appears to be a typical menstrual period, is concerning. This should be evaluated by your caregiver.  CAUSES   Hormone therapy.  Cancer of the cervix or cancer of the lining of the uterus (endometrial cancer).  Thinning of the uterine lining (uterine atrophy).  Thyroid diseases.  Certain medicines.  Infection of the uterus or cervix.  Inflammation or irritation of the uterine lining (endometritis).  Estrogen-secreting tumors.  Growths (polyps) on the cervix, uterine lining, or uterus.  Uterine tumors (fibroids).  Being very overweight (obese). DIAGNOSIS  Your caregiver will take a medical history and ask questions. A physical exam will also be performed. Further tests may include:   A transvaginal ultrasound. An ultrasound wand or probe is inserted into your vagina to view the pelvic organs.  A biopsy of the lining of the uterus (endometrium). A sample of the endometrium is removed and examined.  A hysteroscopy. Your caregiver may use an instrument with a light and a camera attached to it (hysteroscope). The hysteroscope is used to look inside the uterus for problems.  A dilation and curettage (D&C). Tissue is removed from the uterine lining to be examined for problems. TREATMENT  Treatment depends on the cause of the bleeding. Some treatments include:   Surgery.  Medicines.  Hormones.  A hysteroscopy or D&C to remove polyps or fibroids.  Changing or stopping a current medicine you are taking. Talk to your caregiver about your specific treatment. HOME CARE INSTRUCTIONS    Maintain a healthy weight.  Keep regular pelvic exams and Pap tests. SEEK MEDICAL CARE IF:   You have bleeding, even if it is light in comparison to your previous periods.  Your bleeding lasts more than 1 week.  You have abdominal pain.  You develop bleeding with sexual intercourse. SEEK IMMEDIATE MEDICAL CARE IF:   You have a fever, chills, headache, dizziness, muscle aches, and bleeding.  You have severe pain with bleeding.  You are passing blood clots.  You have bleeding and need more than 1 pad an hour.  You feel faint. MAKE SURE YOU:  Understand these instructions.  Will watch your condition.  Will get help right away if you are not doing well or get worse. Document Released: 09/04/2005 Document Revised: 08/19/2011 Document Reviewed: 01/31/2011 [facility] Patient Information 2013 [place], [place]. Endometrial Ablation Endometrial ablation removes the lining of the uterus (endometrium). It is usually a same day, outpatient treatment. Ablation helps avoid major surgery (such as a hysterectomy). A hysterectomy is removal of the cervix and uterus. Endometrial ablation has less risk and complications, has a shorter recovery period and is less expensive. After endometrial ablation, most women will have little or no menstrual bleeding. You may not keep your fertility. Pregnancy is no longer likely after this procedure but if you are pre-menopausal, you still need to use a reliable method of birth control following the procedure because pregnancy can occur. REASONS TO HAVE THE PROCEDURE MAY INCLUDE:  Heavy periods.  Bleeding that is causing anemia.  Anovulatory bleeding, very irregular, bleeding.  Bleeding submucous fibroids (on the lining inside the uterus) if they are smaller  than 3 centimeters. REASONS NOT TO HAVE THE PROCEDURE MAY INCLUDE:  You wish to have more children.  You have a pre-cancerous or cancerous problem. The cause of any abnormal bleeding must be  diagnosed before having the procedure.  You have pain coming from the uterus.  You have a submucus fibroid larger than 3 centimeters.  You recently had a baby.  You recently had an infection in the uterus.  You have a severe retro-flexed, tipped uterus and cannot insert the instrument to do the ablation.  You had a Cesarean section or deep major surgery on the uterus.  The inner cavity of the uterus is too large for the endometrial ablation instrument. RISKS AND COMPLICATIONS   Perforation of the uterus.  Bleeding.  Infection of the uterus, bladder or vagina.  Injury to surrounding organs.  Cutting the cervix.  An air bubble to the lung (air embolus).  Pregnancy following the procedure.  Failure of the procedure to help the problem requiring hysterectomy.  Decreased ability to diagnose cancer in the lining of the uterus. BEFORE THE PROCEDURE  The lining of the uterus must be tested to make sure there is no pre-cancerous or cancer cells present.  Medications may be given to make the lining of the uterus thinner.  Ultrasound may be used to evaluate the size and look for abnormalities of the uterus.  Future pregnancy is not desired. PROCEDURE  There are different ways to destroy the lining of the uterus.   Resectoscope - radio frequency-alternating electric current is the most common one used.  Cryotherapy - freezing the lining of the uterus.  Heated Free Liquid - heated salt (saline) solution inserted into the uterus.  Microwave - uses high energy microwaves in the uterus.  Thermal Balloon - a catheter with a balloon tip is inserted into the uterus and filled with heated fluid. Your caregiver will talk with you about the method used in this clinic. They will also instruct you on the pros and cons of the procedure. Endometrial ablation is performed along with a procedure called operative hysteroscopy. A narrow viewing tube is inserted through the birth canal  (vagina) and through the cervix into the uterus. A tiny camera attached to the viewing tube (hysteroscope) allows the uterine cavity to be shown on a TV monitor during surgery. Your uterus is filled with a harmless liquid to make the procedure easier. The lining of the uterus is then removed. The lining can also be removed with a resectoscope which allows your surgeon to cut away the lining of the uterus under direct vision. Usually, you will be able to go home within an hour after the procedure. HOME CARE INSTRUCTIONS   Do not drive for 24 hours.  No tampons, douching or intercourse for 2 weeks or until your caregiver approves.  Rest at home for 24 to 48 hours. You may then resume normal activities unless told differently by your caregiver.  Take your temperature two times a day for 4 days, and record it.  Take any medications your caregiver has ordered, as directed.  Use some form of contraception if you are pre-menopausal and do not want to get pregnant. Bleeding after the procedure is normal. It varies from light spotting and mildly watery to bloody discharge for 4 to 6 weeks. You may also have mild cramping. Only take over-the-counter or prescription medicines for pain, discomfort, or fever as directed by your caregiver. Do not use aspirin, as this may aggravate bleeding. Frequent urination during  the first 24 hours is normal. You will not know how effective your surgery is until at least 3 months after the surgery. SEEK IMMEDIATE MEDICAL CARE IF:   Bleeding is heavier than a normal menstrual cycle.  An oral temperature above 102 F (38.9 C) develops.  You have increasing cramps or pains not relieved with medication or develop belly (abdominal) pain which does not seem to be related to the same area of earlier cramping and pain.  You are light headed, weak or have fainting episodes.  You develop pain in the shoulder strap areas.  You have chest or leg pain.  You have abnormal  vaginal discharge.  You have painful urination. Document Released: 04/05/2004 Document Revised: 08/19/2011 Document Reviewed: 07/04/2007 [facility] Patient Information 2013 [place], [place].

## 2012-08-28 NOTE — Progress Notes (Signed)
Subjective:     Patient ID: Jessica Howard, female   DOB: 1961-02-14, 52 y.o.   MRN: [id number]  HPI Pt c/o menorrhagia.  No bleeding between cycles.  Has had OCP's in the past resulting in irreg cycles.  She c/o pain with cycles as well./   Past Medical History  Diagnosis Date  . Unspecified deficiency anemia   . Hypertension   . Diabetes mellitus   . Hyperlipidemia   . Rheumatoid arthritis   . Endometrial polyp   . Fibroadenoma of breast     left  . Hepatic steatosis    Past Surgical History  Procedure Laterality Date  . Tubal ligation      bilateral  . Hand surgery      for arthritis  . Tonsillectomy    . Left knee surgery nodule removal    . Other surgical history      multiple nodule removal on neck, hands, left knee  . Dilation and curettage of uterus    Scheduled Meds: Continuous Infusions: PRN Meds:.   Allergies  Allergen Reactions  . Bactrim (Sulfamethoxazole-Tmp Ds) Hives  . Cefuroxime Axetil   . Cephalosporins Itching  . Iohexol      Code: RASH, Desc: HAD ITCHING AND A RASH ABOUT ONE HOUR AFTER RETURNING HOME FROM THE CT, Onset Date: [id number]   . Lisinopril Cough  . Penicillins   . Sulfa Antibiotics    History   Social History  . Marital Status: Legally Separated    Spouse Name: N/A    Number of Children: N/A  . Years of Education: N/A   Occupational History  . Not on file.   Social History Main Topics  . Smoking status: Current Every Day Smoker  . Smokeless tobacco: Never Used  . Alcohol Use: No  . Drug Use: No  . Sexually Active: Not Currently    Birth Control/ Protection: Surgical   Other Topics Concern  . Not on file   Social History Narrative  . No narrative on file       Review of Systems     Objective:   Physical Exam BP 125/82  Pulse 82  Temp(Src) 97.9 F (36.6 C)  Wt 247 lb (112.038 kg)  BMI 43.76 kg/m2  LMP 07/27/2012  The indications for endometrial biopsy were reviewed.   Risks of the biopsy including  cramping, bleeding, infection, uterine perforation, inadequate specimen and need for additional procedures  were discussed. The patient states she understands and agrees to undergo procedure today. Consent was signed. Time out was performed. Urine HCG was negative. A sterile speculum was placed in the patient's vagina and the cervix was prepped with Betadine. A single-toothed tenaculum was placed on the anterior lip of the cervix to stabilize it. The 3 mm pipelle was introduced into the endometrial cavity without difficulty to a depth of8cm, and a moderate amount of tissue was obtained and sent to pathology. The instruments were removed from the patient's vagina. Minimal bleeding from the cervix was noted. The patient tolerated the procedure well.      Assessment:     Menorrhagia- pt has tried OCP's in the past with no relief of sx      Plan:    Routine post-procedure instructions were given to the patient. The patient will follow up to review the results and for further management  in 4 weeks to review results .    Given info on Endometrial ablation

## 2012-09-02 ENCOUNTER — Encounter: Payer: Self-pay | Admitting: *Deleted

## 2012-09-02 DIAGNOSIS — L280 Lichen simplex chronicus: Secondary | ICD-10-CM | POA: Diagnosis not present

## 2012-09-17 ENCOUNTER — Encounter (HOSPITAL_COMMUNITY)
Admission: RE | Admit: 2012-09-17 | Discharge: 2012-09-17 | Disposition: A | Discharge status: Home / Self Care | Payer: Medicare Other | Source: Ambulatory Visit | Attending: Internal Medicine | Admitting: Internal Medicine

## 2012-09-17 DIAGNOSIS — M069 Rheumatoid arthritis, unspecified: Secondary | ICD-10-CM | POA: Diagnosis not present

## 2012-09-17 MED ORDER — SODIUM CHLORIDE 0.9 % IV SOLN
INTRAVENOUS | Status: AC
  Administered 2012-09-17: 250 mL via INTRAVENOUS

## 2012-09-17 MED ORDER — SODIUM CHLORIDE 0.9 % IV SOLN
1000.0000 mg | INTRAVENOUS | Status: AC
  Administered 2012-09-17: 1000 mg via INTRAVENOUS
  Filled 2012-09-17: qty 40

## 2012-09-25 ENCOUNTER — Ambulatory Visit: Payer: Medicare Other | Admitting: Obstetrics & Gynecology

## 2012-10-07 ENCOUNTER — Ambulatory Visit (INDEPENDENT_AMBULATORY_CARE_PROVIDER_SITE_OTHER): Payer: Medicare Other | Admitting: Obstetrics & Gynecology

## 2012-10-07 ENCOUNTER — Encounter: Payer: Self-pay | Admitting: Obstetrics & Gynecology

## 2012-10-07 VITALS — BP 167/97 | HR 100 | Temp 99.5°F | Ht 63.0 in | Wt 252.1 lb

## 2012-10-07 DIAGNOSIS — N920 Excessive and frequent menstruation with regular cycle: Secondary | ICD-10-CM | POA: Diagnosis not present

## 2012-10-07 NOTE — Progress Notes (Signed)
Here for results/ follow up of menorrhagia. Also reports has a  White pump on perianal area. Also reports did not take bp meds today

## 2012-10-07 NOTE — Progress Notes (Signed)
Subjective:     Patient ID: Jessica Howard, female   DOB: 16-Aug-1960, 52 y.o.   MRN: [id number]  HPI  Pt in to review results of endo bx and discuss treatment options of menorrhagia.  She has read the literature and would like to proceed with endobx.  She has tried OCP's in the past with no relief of sx.  Review of Systems     Objective:   Physical Exam BP 167/97  Pulse 100  Temp(Src) 99.5 F (37.5 C)  Ht 5\' 3"  (1.6 m)  Wt 252 lb 1.6 oz (114.352 kg)  BMI 44.67 kg/m2  LMP 10/06/2012 Pt in NAD   08/28/2012 Diagnosis Endometrium, biopsy - INACTIVE ENDOMETRIUM. NO HYPERPLASIA OR CARCINOMA     Assessment:     Menorrhagia.  Pt has reviewed the info on ablation and she wishes to proceed with it.     Plan:     Patient desires surgical management with hysteroscopy and endometrial ablation.  The risks of surgery were discussed in detail with the patient including but not limited to: bleeding which may require transfusion or reoperation; infection which may require prolonged hospitalization or re-hospitalization and antibiotic therapy; injury to bowel, bladder, ureters and major vessels or other surrounding organs; need for additional procedures including laparotomy; thromboembolic phenomenon,  and other postoperative or anesthesia complications.  Patient was told that the likelihood that her condition and symptoms will be treated effectively with this surgical management was very high; the postoperative expectations were also discussed in detail. The patient also understands the alternative treatment options which were discussed in full. All questions were answered.  She was told that she will be contacted by our surgical scheduler regarding the time and date of her surgery; routine preoperative instructions of having nothing to eat or drink after midnight on the day prior to surgery and also coming to the hospital 1 1/2 hours prior to her time of surgery were also emphasized.  She was told  she may be called for a preoperative appointment about a week prior to surgery and will be given further preoperative instructions at that visit. Printed patient education handouts about the procedure were given to the patient to review at home.

## 2012-10-07 NOTE — Patient Instructions (Signed)

## 2012-10-09 ENCOUNTER — Encounter (HOSPITAL_COMMUNITY): Payer: Self-pay | Admitting: Pharmacist

## 2012-10-15 ENCOUNTER — Encounter (HOSPITAL_COMMUNITY): Payer: Medicare Other

## 2012-10-19 ENCOUNTER — Encounter (HOSPITAL_COMMUNITY): Payer: Self-pay

## 2012-10-19 ENCOUNTER — Inpatient Hospital Stay (HOSPITAL_COMMUNITY): Admission: RE | Admit: 2012-10-19 | Payer: Medicare Other | Source: Ambulatory Visit

## 2012-10-19 ENCOUNTER — Encounter (HOSPITAL_COMMUNITY)
Admission: RE | Admit: 2012-10-19 | Discharge: 2012-10-19 | Disposition: A | Discharge status: Home / Self Care | Payer: Medicare Other | Source: Ambulatory Visit | Attending: Obstetrics & Gynecology | Admitting: Obstetrics & Gynecology

## 2012-10-19 DIAGNOSIS — Z01812 Encounter for preprocedural laboratory examination: Secondary | ICD-10-CM | POA: Insufficient documentation

## 2012-10-19 DIAGNOSIS — Z01818 Encounter for other preprocedural examination: Secondary | ICD-10-CM | POA: Insufficient documentation

## 2012-10-19 HISTORY — DX: Sleep apnea, unspecified: G47.30

## 2012-10-19 LAB — BASIC METABOLIC PANEL WITH GFR
BUN: 9 mg/dL (ref 6–23)
CO2: 28 meq/L (ref 19–32)
Calcium: 8.7 mg/dL (ref 8.4–10.5)
Chloride: 97 meq/L (ref 96–112)
Creatinine, Ser: 0.65 mg/dL (ref 0.50–1.10)
GFR calc Af Amer: >90 mL/min (ref >90)
GFR calc non Af Amer: >90 mL/min (ref >90)
Glucose, Bld: 415 mg/dL — ABNORMAL HIGH (ref 70–99)
Potassium: 3.5 meq/L (ref 3.5–5.1)
Sodium: 134 meq/L — ABNORMAL LOW (ref 135–145)

## 2012-10-19 LAB — CBC
MCH: 21.3 pg — ABNORMAL LOW (ref 26.0–34.0)
MCV: 64.7 fL — ABNORMAL LOW (ref 78.0–100.0)
Platelets: 298 10*3/uL (ref 150–400)
RBC: 4.93 MIL/uL (ref 3.87–5.11)

## 2012-10-19 LAB — SURGICAL PCR SCREEN: MRSA, PCR: NEGATIVE

## 2012-10-19 NOTE — Pre-Procedure Instructions (Signed)
Blood glucose of 415 called to Dr Arby Barrette. Order given to call surgeon, postpone surgery and have patient see PCP. Dr Erin Fulling contacted and agrees, she will have Rikki Spearing (MD office) notify patient and reschedule procedure.

## 2012-10-19 NOTE — Pre-Procedure Instructions (Signed)
History and EKG reviewed and accepted by Dr Arby Barrette.

## 2012-10-19 NOTE — Patient Instructions (Addendum)
20 Jessica Howard  10/19/2012   Your procedure is scheduled on:  10/22/12  Enter through the Main Entrance of [facility] at 930 AM.  Pick up the phone at the desk and dial [phone].   Call this number if you have problems the morning of surgery: [phone]   Remember:   Do not eat food:After Midnight.  Do not drink clear liquids: After Midnight.  Take these medicines the morning of surgery with A SIP OF WATER: Blood pressure medications, Prednisone,    Do not wear jewelry, make-up or nail polish.  Do not wear lotions, powders, or perfumes. You may wear deodorant.  Do not shave 48 hours prior to surgery.  Do not bring valuables to the hospital.  Contacts, dentures or bridgework may not be worn into surgery.  Leave suitcase in the car. After surgery it may be brought to your room.  For patients admitted to the hospital, checkout time is 11:00 AM the day of discharge.   Patients discharged the day of surgery will not be allowed to drive home.  Name and phone number of your driver: Undecided  Special Instructions: Shower using CHG 2 nights before surgery and the night before surgery.  If you shower the day of surgery use CHG.  Use special wash - you have one bottle of CHG for all showers.  You should use approximately 1/3 of the bottle for each shower.   Please read over the following fact sheets that you were given: Surgical Site Infection Prevention

## 2012-10-22 ENCOUNTER — Ambulatory Visit (HOSPITAL_COMMUNITY)
Admission: RE | Admit: 2012-10-22 | Payer: Medicare Other | Source: Ambulatory Visit | Admitting: Obstetrics & Gynecology

## 2012-10-22 ENCOUNTER — Encounter (HOSPITAL_COMMUNITY): Admission: RE | Payer: Self-pay | Source: Ambulatory Visit

## 2012-10-22 SURGERY — HYSTEROSCOPY WITH NOVASURE
Anesthesia: Choice

## 2012-10-27 ENCOUNTER — Encounter (HOSPITAL_COMMUNITY)
Admission: RE | Admit: 2012-10-27 | Discharge: 2012-10-27 | Disposition: A | Discharge status: Home / Self Care | Payer: Medicare Other | Source: Ambulatory Visit | Attending: Internal Medicine | Admitting: Internal Medicine

## 2012-10-27 DIAGNOSIS — M069 Rheumatoid arthritis, unspecified: Secondary | ICD-10-CM | POA: Insufficient documentation

## 2012-10-27 MED ORDER — SODIUM CHLORIDE 0.9 % IV SOLN
1000.0000 mg | INTRAVENOUS | Status: DC
  Administered 2012-10-27: 1000 mg via INTRAVENOUS
  Filled 2012-10-27: qty 40

## 2012-10-27 MED ORDER — SODIUM CHLORIDE 0.9 % IV SOLN
INTRAVENOUS | Status: DC
  Administered 2012-10-27: 250 mL via INTRAVENOUS

## 2012-10-30 DIAGNOSIS — D126 Benign neoplasm of colon, unspecified: Secondary | ICD-10-CM | POA: Diagnosis not present

## 2012-10-30 DIAGNOSIS — K648 Other hemorrhoids: Secondary | ICD-10-CM | POA: Diagnosis not present

## 2012-10-30 DIAGNOSIS — Z1211 Encounter for screening for malignant neoplasm of colon: Secondary | ICD-10-CM | POA: Diagnosis not present

## 2012-10-30 DIAGNOSIS — R198 Other specified symptoms and signs involving the digestive system and abdomen: Secondary | ICD-10-CM | POA: Diagnosis not present

## 2012-10-30 DIAGNOSIS — R12 Heartburn: Secondary | ICD-10-CM | POA: Diagnosis not present

## 2012-10-30 DIAGNOSIS — K219 Gastro-esophageal reflux disease without esophagitis: Secondary | ICD-10-CM | POA: Diagnosis not present

## 2012-11-05 DIAGNOSIS — E1065 Type 1 diabetes mellitus with hyperglycemia: Secondary | ICD-10-CM | POA: Diagnosis not present

## 2012-11-05 DIAGNOSIS — R51 Headache: Secondary | ICD-10-CM | POA: Diagnosis not present

## 2012-11-05 DIAGNOSIS — M069 Rheumatoid arthritis, unspecified: Secondary | ICD-10-CM | POA: Diagnosis not present

## 2012-11-05 DIAGNOSIS — I10 Essential (primary) hypertension: Secondary | ICD-10-CM | POA: Diagnosis not present

## 2012-11-12 ENCOUNTER — Ambulatory Visit (INDEPENDENT_AMBULATORY_CARE_PROVIDER_SITE_OTHER): Payer: Medicare Other | Admitting: Obstetrics & Gynecology

## 2012-11-12 ENCOUNTER — Encounter: Payer: Self-pay | Admitting: Obstetrics & Gynecology

## 2012-11-12 VITALS — BP 124/76 | HR 74 | Temp 98.9°F | Ht 63.0 in | Wt 263.0 lb

## 2012-11-12 DIAGNOSIS — N920 Excessive and frequent menstruation with regular cycle: Secondary | ICD-10-CM | POA: Diagnosis not present

## 2012-11-12 NOTE — Progress Notes (Signed)
Patient ID: Jessica Howard, female   DOB: March 02, 1961, 52 y.o.   MRN: [id number] Pt was scheduled for hysteroscopy with endometrial ablation 1 month ago.  Her surgery was cancelled due to a glc of 451.  Pt reports that she has been taking her medication 3 times daily since her surgery was cancelled and she has had glc less than 200's (some in the 90's).  Pt reports that she has had a headache which she is following her primary care physician for.  She reports that she is continuing to have heavy bleeding with menses and wants to proceed with the hysteroscopy with endometrial ablation. The risks of surgery were discussed in detail with the patient including but not limited to: bleeding which may require transfusion or reoperation; infection which may require prolonged hospitalization or re-hospitalization and antibiotic therapy; injury to bowel, bladder, ureters and major vessels or other surrounding organs; need for additional procedures including laparotomy; thromboembolic phenomenon, incisional problems and other postoperative or anesthesia complications.  Patient was told that the likelihood that her condition and symptoms will be treated effectively with this surgical management was very high; the postoperative expectations were also discussed in detail. The patient also understands the alternative treatment options which were discussed in full. All questions were answered.

## 2012-11-12 NOTE — Patient Instructions (Addendum)
Endometrial Ablation Endometrial ablation removes the lining of the uterus (endometrium). It is usually a same day, outpatient treatment. Ablation helps avoid major surgery (such as a hysterectomy). A hysterectomy is removal of the cervix and uterus. Endometrial ablation has less risk and complications, has a shorter recovery period and is less expensive. After endometrial ablation, most women will have little or no menstrual bleeding. You may not keep your fertility. Pregnancy is no longer likely after this procedure but if you are pre-menopausal, you still need to use a reliable method of birth control following the procedure because pregnancy can occur. REASONS TO HAVE THE PROCEDURE MAY INCLUDE:  Heavy periods.  Bleeding that is causing anemia.  Anovulatory bleeding, very irregular, bleeding.  Bleeding submucous fibroids (on the lining inside the uterus) if they are smaller than 3 centimeters. REASONS NOT TO HAVE THE PROCEDURE MAY INCLUDE:  You wish to have more children.  You have a pre-cancerous or cancerous problem. The cause of any abnormal bleeding must be diagnosed before having the procedure.  You have pain coming from the uterus.  You have a submucus fibroid larger than 3 centimeters.  You recently had a baby.  You recently had an infection in the uterus.  You have a severe retro-flexed, tipped uterus and cannot insert the instrument to do the ablation.  You had a Cesarean section or deep major surgery on the uterus.  The inner cavity of the uterus is too large for the endometrial ablation instrument. RISKS AND COMPLICATIONS   Perforation of the uterus.  Bleeding.  Infection of the uterus, bladder or vagina.  Injury to surrounding organs.  Cutting the cervix.  An air bubble to the lung (air embolus).  Pregnancy following the procedure.  Failure of the procedure to help the problem requiring hysterectomy.  Decreased ability to diagnose cancer in the lining of  the uterus. BEFORE THE PROCEDURE  The lining of the uterus must be tested to make sure there is no pre-cancerous or cancer cells present.  Medications may be given to make the lining of the uterus thinner.  Ultrasound may be used to evaluate the size and look for abnormalities of the uterus.  Future pregnancy is not desired. PROCEDURE  There are different ways to destroy the lining of the uterus.   Resectoscope - radio frequency-alternating electric current is the most common one used.  Cryotherapy - freezing the lining of the uterus.  Heated Free Liquid - heated salt (saline) solution inserted into the uterus.  Microwave - uses high energy microwaves in the uterus.  Thermal Balloon - a catheter with a balloon tip is inserted into the uterus and filled with heated fluid. Your caregiver will talk with you about the method used in this clinic. They will also instruct you on the pros and cons of the procedure. Endometrial ablation is performed along with a procedure called operative hysteroscopy. A narrow viewing tube is inserted through the birth canal (vagina) and through the cervix into the uterus. A tiny camera attached to the viewing tube (hysteroscope) allows the uterine cavity to be shown on a TV monitor during surgery. Your uterus is filled with a harmless liquid to make the procedure easier. The lining of the uterus is then removed. The lining can also be removed with a resectoscope which allows your surgeon to cut away the lining of the uterus under direct vision. Usually, you will be able to go home within an hour after the procedure. HOME CARE INSTRUCTIONS   Do   not drive for 24 hours.  No tampons, douching or intercourse for 2 weeks or until your caregiver approves.  Rest at home for 24 to 48 hours. You may then resume normal activities unless told differently by your caregiver.  Take your temperature two times a day for 4 days, and record it.  Take any medications your  caregiver has ordered, as directed.  Use some form of contraception if you are pre-menopausal and do not want to get pregnant. Bleeding after the procedure is normal. It varies from light spotting and mildly watery to bloody discharge for 4 to 6 weeks. You may also have mild cramping. Only take over-the-counter or prescription medicines for pain, discomfort, or fever as directed by your caregiver. Do not use aspirin, as this may aggravate bleeding. Frequent urination during the first 24 hours is normal. You will not know how effective your surgery is until at least 3 months after the surgery. SEEK IMMEDIATE MEDICAL CARE IF:   Bleeding is heavier than a normal menstrual cycle.  An oral temperature above 102 F (38.9 C) develops.  You have increasing cramps or pains not relieved with medication or develop belly (abdominal) pain which does not seem to be related to the same area of earlier cramping and pain.  You are light headed, weak or have fainting episodes.  You develop pain in the shoulder strap areas.  You have chest or leg pain.  You have abnormal vaginal discharge.  You have painful urination. Document Released: 04/05/2004 Document Revised: 08/19/2011 Document Reviewed: 07/04/2007 [employer] Patient Information 2014 [employer].  

## 2012-11-13 DIAGNOSIS — H40019 Open angle with borderline findings, low risk, unspecified eye: Secondary | ICD-10-CM | POA: Diagnosis not present

## 2012-11-17 DIAGNOSIS — M069 Rheumatoid arthritis, unspecified: Secondary | ICD-10-CM | POA: Diagnosis not present

## 2012-11-17 DIAGNOSIS — IMO0001 Reserved for inherently not codable concepts without codable children: Secondary | ICD-10-CM | POA: Diagnosis not present

## 2012-11-17 DIAGNOSIS — G589 Mononeuropathy, unspecified: Secondary | ICD-10-CM | POA: Diagnosis not present

## 2012-11-19 ENCOUNTER — Inpatient Hospital Stay (HOSPITAL_COMMUNITY): Admission: RE | Admit: 2012-11-19 | Payer: Medicare Other | Source: Ambulatory Visit

## 2012-11-24 ENCOUNTER — Encounter (HOSPITAL_COMMUNITY)
Admission: RE | Admit: 2012-11-24 | Discharge: 2012-11-24 | Disposition: A | Discharge status: Home / Self Care | Payer: Medicare Other | Source: Ambulatory Visit | Attending: Internal Medicine | Admitting: Internal Medicine

## 2012-11-24 DIAGNOSIS — M069 Rheumatoid arthritis, unspecified: Secondary | ICD-10-CM | POA: Insufficient documentation

## 2012-11-24 MED ORDER — SODIUM CHLORIDE 0.9 % IV SOLN
INTRAVENOUS | Status: DC
  Administered 2012-11-24: 09:00:00 via INTRAVENOUS

## 2012-11-24 MED ORDER — SODIUM CHLORIDE 0.9 % IV SOLN
1000.0000 mg | INTRAVENOUS | Status: DC
  Administered 2012-11-24: 1000 mg via INTRAVENOUS
  Filled 2012-11-24: qty 40

## 2012-11-26 ENCOUNTER — Ambulatory Visit (HOSPITAL_COMMUNITY)
Admission: RE | Admit: 2012-11-26 | Payer: Medicare Other | Source: Ambulatory Visit | Admitting: Obstetrics & Gynecology

## 2012-11-26 ENCOUNTER — Encounter (HOSPITAL_COMMUNITY): Admission: RE | Payer: Self-pay | Source: Ambulatory Visit

## 2012-11-26 SURGERY — HYSTEROSCOPY WITH NOVASURE
Anesthesia: Choice | Site: Vagina

## 2012-12-28 ENCOUNTER — Encounter (HOSPITAL_COMMUNITY)
Admission: RE | Admit: 2012-12-28 | Discharge: 2012-12-28 | Disposition: A | Discharge status: Home / Self Care | Payer: Medicare Other | Source: Ambulatory Visit | Attending: Internal Medicine | Admitting: Internal Medicine

## 2012-12-28 DIAGNOSIS — M069 Rheumatoid arthritis, unspecified: Secondary | ICD-10-CM | POA: Insufficient documentation

## 2012-12-28 MED ORDER — SODIUM CHLORIDE 0.9 % IV SOLN
INTRAVENOUS | Status: DC
  Administered 2012-12-28: 10:00:00 via INTRAVENOUS

## 2012-12-28 MED ORDER — SODIUM CHLORIDE 0.9 % IV SOLN
1000.0000 mg | INTRAVENOUS | Status: DC
  Administered 2012-12-28: 1000 mg via INTRAVENOUS
  Filled 2012-12-28: qty 40

## 2013-01-11 DIAGNOSIS — I10 Essential (primary) hypertension: Secondary | ICD-10-CM | POA: Diagnosis not present

## 2013-01-11 DIAGNOSIS — R51 Headache: Secondary | ICD-10-CM | POA: Diagnosis not present

## 2013-01-11 DIAGNOSIS — E7800 Pure hypercholesterolemia, unspecified: Secondary | ICD-10-CM | POA: Diagnosis not present

## 2013-01-13 DIAGNOSIS — S058X9A Other injuries of unspecified eye and orbit, initial encounter: Secondary | ICD-10-CM | POA: Diagnosis not present

## 2013-01-22 ENCOUNTER — Other Ambulatory Visit (HOSPITAL_COMMUNITY): Payer: Self-pay | Admitting: *Deleted

## 2013-01-25 ENCOUNTER — Encounter (HOSPITAL_COMMUNITY)
Admission: RE | Admit: 2013-01-25 | Discharge: 2013-01-25 | Disposition: A | Discharge status: Home / Self Care | Payer: Medicare Other | Source: Ambulatory Visit | Attending: Internal Medicine | Admitting: Internal Medicine

## 2013-01-25 DIAGNOSIS — M069 Rheumatoid arthritis, unspecified: Secondary | ICD-10-CM | POA: Insufficient documentation

## 2013-01-25 MED ORDER — SODIUM CHLORIDE 0.9 % IV SOLN
INTRAVENOUS | Status: DC
  Administered 2013-01-25: 09:00:00 via INTRAVENOUS

## 2013-01-25 MED ORDER — SODIUM CHLORIDE 0.9 % IV SOLN
1000.0000 mg | INTRAVENOUS | Status: DC
  Administered 2013-01-25: 1000 mg via INTRAVENOUS
  Filled 2013-01-25: qty 40

## 2013-01-27 DIAGNOSIS — E1139 Type 2 diabetes mellitus with other diabetic ophthalmic complication: Secondary | ICD-10-CM | POA: Diagnosis not present

## 2013-02-01 DIAGNOSIS — R809 Proteinuria, unspecified: Secondary | ICD-10-CM | POA: Diagnosis not present

## 2013-02-01 DIAGNOSIS — E7800 Pure hypercholesterolemia, unspecified: Secondary | ICD-10-CM | POA: Diagnosis not present

## 2013-02-01 DIAGNOSIS — I10 Essential (primary) hypertension: Secondary | ICD-10-CM | POA: Diagnosis not present

## 2013-02-01 DIAGNOSIS — E1065 Type 1 diabetes mellitus with hyperglycemia: Secondary | ICD-10-CM | POA: Diagnosis not present

## 2013-02-09 DIAGNOSIS — G43719 Chronic migraine without aura, intractable, without status migrainosus: Secondary | ICD-10-CM | POA: Diagnosis not present

## 2013-02-09 DIAGNOSIS — G518 Other disorders of facial nerve: Secondary | ICD-10-CM | POA: Diagnosis not present

## 2013-02-09 DIAGNOSIS — R51 Headache: Secondary | ICD-10-CM | POA: Diagnosis not present

## 2013-02-09 DIAGNOSIS — Z79899 Other long term (current) drug therapy: Secondary | ICD-10-CM | POA: Diagnosis not present

## 2013-02-09 DIAGNOSIS — Z049 Encounter for examination and observation for unspecified reason: Secondary | ICD-10-CM | POA: Diagnosis not present

## 2013-02-09 DIAGNOSIS — M542 Cervicalgia: Secondary | ICD-10-CM | POA: Diagnosis not present

## 2013-02-10 DIAGNOSIS — IMO0001 Reserved for inherently not codable concepts without codable children: Secondary | ICD-10-CM | POA: Diagnosis not present

## 2013-02-10 DIAGNOSIS — G518 Other disorders of facial nerve: Secondary | ICD-10-CM | POA: Diagnosis not present

## 2013-02-10 DIAGNOSIS — M542 Cervicalgia: Secondary | ICD-10-CM | POA: Diagnosis not present

## 2013-02-10 DIAGNOSIS — R51 Headache: Secondary | ICD-10-CM | POA: Diagnosis not present

## 2013-02-11 DIAGNOSIS — G5600 Carpal tunnel syndrome, unspecified upper limb: Secondary | ICD-10-CM | POA: Diagnosis not present

## 2013-02-17 DIAGNOSIS — IMO0001 Reserved for inherently not codable concepts without codable children: Secondary | ICD-10-CM | POA: Diagnosis not present

## 2013-02-17 DIAGNOSIS — G589 Mononeuropathy, unspecified: Secondary | ICD-10-CM | POA: Diagnosis not present

## 2013-02-17 DIAGNOSIS — Z23 Encounter for immunization: Secondary | ICD-10-CM | POA: Diagnosis not present

## 2013-02-17 DIAGNOSIS — G609 Hereditary and idiopathic neuropathy, unspecified: Secondary | ICD-10-CM | POA: Diagnosis not present

## 2013-02-17 DIAGNOSIS — M069 Rheumatoid arthritis, unspecified: Secondary | ICD-10-CM | POA: Diagnosis not present

## 2013-02-22 ENCOUNTER — Encounter (INDEPENDENT_AMBULATORY_CARE_PROVIDER_SITE_OTHER): Payer: Medicare Other | Admitting: Ophthalmology

## 2013-02-22 DIAGNOSIS — E1139 Type 2 diabetes mellitus with other diabetic ophthalmic complication: Secondary | ICD-10-CM | POA: Diagnosis not present

## 2013-02-22 DIAGNOSIS — H43819 Vitreous degeneration, unspecified eye: Secondary | ICD-10-CM

## 2013-02-22 DIAGNOSIS — I10 Essential (primary) hypertension: Secondary | ICD-10-CM

## 2013-02-22 DIAGNOSIS — H35039 Hypertensive retinopathy, unspecified eye: Secondary | ICD-10-CM

## 2013-02-22 DIAGNOSIS — H2510 Age-related nuclear cataract, unspecified eye: Secondary | ICD-10-CM

## 2013-02-22 DIAGNOSIS — E11319 Type 2 diabetes mellitus with unspecified diabetic retinopathy without macular edema: Secondary | ICD-10-CM

## 2013-02-22 DIAGNOSIS — H3581 Retinal edema: Secondary | ICD-10-CM | POA: Diagnosis not present

## 2013-02-23 ENCOUNTER — Encounter (HOSPITAL_COMMUNITY): Payer: Self-pay | Admitting: *Deleted

## 2013-02-23 ENCOUNTER — Emergency Department (HOSPITAL_COMMUNITY): Payer: Medicare Other

## 2013-02-23 ENCOUNTER — Emergency Department (HOSPITAL_COMMUNITY)
Admission: EM | Admit: 2013-02-23 | Discharge: 2013-02-23 | Disposition: A | Discharge status: Home / Self Care | Payer: Medicare Other | Attending: Emergency Medicine | Admitting: Emergency Medicine

## 2013-02-23 DIAGNOSIS — Z794 Long term (current) use of insulin: Secondary | ICD-10-CM | POA: Diagnosis not present

## 2013-02-23 DIAGNOSIS — Z792 Long term (current) use of antibiotics: Secondary | ICD-10-CM | POA: Diagnosis not present

## 2013-02-23 DIAGNOSIS — Z880 Allergy status to penicillin: Secondary | ICD-10-CM | POA: Diagnosis not present

## 2013-02-23 DIAGNOSIS — F17200 Nicotine dependence, unspecified, uncomplicated: Secondary | ICD-10-CM | POA: Insufficient documentation

## 2013-02-23 DIAGNOSIS — Z8719 Personal history of other diseases of the digestive system: Secondary | ICD-10-CM | POA: Diagnosis not present

## 2013-02-23 DIAGNOSIS — Z8739 Personal history of other diseases of the musculoskeletal system and connective tissue: Secondary | ICD-10-CM | POA: Diagnosis not present

## 2013-02-23 DIAGNOSIS — Z862 Personal history of diseases of the blood and blood-forming organs and certain disorders involving the immune mechanism: Secondary | ICD-10-CM | POA: Diagnosis not present

## 2013-02-23 DIAGNOSIS — Z79899 Other long term (current) drug therapy: Secondary | ICD-10-CM | POA: Insufficient documentation

## 2013-02-23 DIAGNOSIS — R110 Nausea: Secondary | ICD-10-CM | POA: Diagnosis not present

## 2013-02-23 DIAGNOSIS — I10 Essential (primary) hypertension: Secondary | ICD-10-CM | POA: Insufficient documentation

## 2013-02-23 DIAGNOSIS — R51 Headache: Secondary | ICD-10-CM | POA: Diagnosis not present

## 2013-02-23 DIAGNOSIS — E119 Type 2 diabetes mellitus without complications: Secondary | ICD-10-CM | POA: Diagnosis not present

## 2013-02-23 DIAGNOSIS — E785 Hyperlipidemia, unspecified: Secondary | ICD-10-CM | POA: Diagnosis not present

## 2013-02-23 DIAGNOSIS — IMO0002 Reserved for concepts with insufficient information to code with codable children: Secondary | ICD-10-CM | POA: Diagnosis not present

## 2013-02-23 DIAGNOSIS — Z8742 Personal history of other diseases of the female genital tract: Secondary | ICD-10-CM | POA: Insufficient documentation

## 2013-02-23 DIAGNOSIS — H53149 Visual discomfort, unspecified: Secondary | ICD-10-CM | POA: Insufficient documentation

## 2013-02-23 MED ORDER — KETOROLAC TROMETHAMINE 30 MG/ML IJ SOLN
30.0000 mg | Freq: Once | INTRAMUSCULAR | Status: AC
  Administered 2013-02-23: 30 mg via INTRAVENOUS
  Filled 2013-02-23: qty 1

## 2013-02-23 MED ORDER — HYDROMORPHONE HCL PF 1 MG/ML IJ SOLN: 1.0000 mg | Freq: Once | INTRAMUSCULAR | Status: DC

## 2013-02-23 MED ORDER — DIPHENHYDRAMINE HCL 50 MG/ML IJ SOLN
25.0000 mg | Freq: Once | INTRAMUSCULAR | Status: AC
  Administered 2013-02-23: 25 mg via INTRAVENOUS
  Filled 2013-02-23: qty 1

## 2013-02-23 MED ORDER — SODIUM CHLORIDE 0.9 % IV BOLUS (SEPSIS)
1000.0000 mL | Freq: Once | INTRAVENOUS | Status: AC
  Administered 2013-02-23: 1000 mL via INTRAVENOUS

## 2013-02-23 MED ORDER — METOCLOPRAMIDE HCL 5 MG/ML IJ SOLN
10.0000 mg | INTRAMUSCULAR | Status: AC
  Administered 2013-02-23: 10 mg via INTRAVENOUS
  Filled 2013-02-23: qty 2

## 2013-02-23 NOTE — ED Notes (Signed)
Reports having right side headache since Saturday with nausea and sensitivity to light. No vomiting. No acute distress noted at triage.

## 2013-02-23 NOTE — Discharge Instructions (Signed)
Follow up with your doctor for further evaluation. Refer to attached documents for more information. Return to the ED with worsening or concerning symptoms.  °

## 2013-02-23 NOTE — ED Provider Notes (Signed)
CSN: [id number]     Arrival date & time 02/23/13  1003 History   First MD Initiated Contact with Patient 02/23/13 1055     Chief Complaint  Patient presents with  . Headache   (Consider location/radiation/quality/duration/timing/severity/associated sxs/prior Treatment) HPI Comments: Patient is a 52 year old female with a past medical history of chronic migraines who presents with a headache for 3 days. [facility] in [place]. Patient reports a gradual onset and progressive worsening of the headache. The pain is sharp, constant and is located in generalized head without radiation. Patient has tried Vicodin for symptoms without relief. No alleviating/aggravating factors. Patient reports associated nausea and photophobia. Patient denies fever, vomiting, diarrhea, numbness/tingling, weakness, visual changes, congestion, chest pain, SOB, abdominal pain. Patient does not take blood thinners and denies head trauma.      Past Medical History  Diagnosis Date  . Unspecified deficiency anemia   . Hypertension   . Diabetes mellitus   . Hyperlipidemia   . Rheumatoid arthritis(714.0)   . Endometrial polyp   . Fibroadenoma of breast     left  . Hepatic steatosis   . Sleep apnea    Past Surgical History  Procedure Laterality Date  . Tubal ligation      bilateral  . Hand surgery      for arthritis  . Tonsillectomy    . Left knee surgery nodule removal    . Other surgical history      multiple nodule removal on neck, hands, left knee  . Dilation and curettage of uterus     Family History  Problem Relation Age of Onset  . Diabetes Mother   . Cancer Mother     breast  . Diabetes Sister   . Cancer Brother     bone marrow cancer   History  Substance Use Topics  . Smoking status: Current Every Day Smoker  . Smokeless tobacco: Never Used  . Alcohol Use: No   OB History   Grav Para Term Preterm Abortions TAB SAB Ect Mult Living   3 2 2  0 1 0 1 0 0 2      Review of Systems  Eyes: Positive for photophobia.  Gastrointestinal: Positive for nausea.  Neurological: Positive for headaches.  All other systems reviewed and are negative.    Allergies  Bactrim; Cefuroxime axetil; Cephalosporins; Lisinopril; Penicillins; Sulfa antibiotics; and Iohexol  Home Medications   Current Outpatient Rx  Name  Route  Sig  Dispense  Refill  . abatacept (ORENCIA) 250 MG injection   Intravenous   Inject 1,000 mg into the vein every 28 (twenty-eight) days. Monthly Infusion         . adapalene (DIFFERIN) 0.1 % cream   Topical   Apply 1 application topically at bedtime.         Marland Kitchen amitriptyline (ELAVIL) 25 MG tablet   Oral   Take 25 mg by mouth daily.          Marland Kitchen azelaic acid (AZELEX) 20 % cream   Topical   Apply 1 application topically 2 (two) times daily. After skin is thoroughly washed and patted dry, gently but thoroughly massage a thin film of azelaic acid cream into the affected area twice daily, in the morning and evening.         . chlorthalidone (HYGROTON) 25 MG tablet   Oral   Take 25 mg by mouth daily.         . clindamycin-benzoyl  peroxide (BENZACLIN) gel   Topical   Apply 1 application topically daily. For face         . clobetasol cream (TEMOVATE) 0.05 %   Topical   Apply 1 application topically 2 (two) times daily.         Marland Kitchen diltiazem (DILACOR XR) 240 MG 24 hr capsule   Oral   Take 240 mg by mouth daily.         Marland Kitchen gabapentin (NEURONTIN) 100 MG capsule   Oral   Take 100 mg by mouth 3 (three) times daily.         . halobetasol (ULTRAVATE) 0.05 % cream   Topical   Apply 1 application topically every 7 (seven) days. Put cream on, wrap leg and leave on for 7 days         . HYDROcodone-acetaminophen (VICODIN) 5-500 MG per tablet   Oral   Take 1 tablet by mouth every 6 (six) hours as needed for pain.         Marland Kitchen insulin lispro (HUMALOG) 100 UNIT/ML injection   Subcutaneous   Inject 20 Units into the  skin 3 (three) times daily before meals.         . insulin NPH (HUMULIN N,NOVOLIN N) 100 UNIT/ML injection   Subcutaneous   Inject 50-75 Units into the skin 3 (three) times daily with meals. 75 units with breakfast; lunch 50 units; 75 units at dinner         . irbesartan (AVAPRO) 300 MG tablet   Oral   Take 300 mg by mouth daily.          Marland Kitchen leflunomide (ARAVA) 20 MG tablet   Oral   Take 20 mg by mouth daily.         . Nebivolol HCl (BYSTOLIC) 20 MG TABS   Oral   Take 20 mg by mouth daily.         . pantoprazole (PROTONIX) 40 MG tablet   Oral   Take 40 mg by mouth daily.         . predniSONE (DELTASONE) 5 MG tablet   Oral   Take 5 mg by mouth daily.         . rosuvastatin (CRESTOR) 10 MG tablet   Oral   Take 10 mg by mouth daily.         Marland Kitchen tiZANidine (ZANAFLEX) 4 MG capsule   Oral   Take 4 mg by mouth 3 (three) times daily as needed for muscle spasms.          . Vitamin D, Ergocalciferol, (DRISDOL) 50000 UNITS CAPS   Oral   Take 50,000 Units by mouth every 7 (seven) days. Friday          BP 167/76  Pulse 84  Temp(Src) 98.2 F (36.8 C) (Oral)  Resp 18  SpO2 100%  LMP 01/27/2013 Physical Exam  Nursing note and vitals reviewed. Constitutional: She is oriented to person, place, and time. She appears well-developed and well-nourished. No distress.  HENT:  Head: Normocephalic and atraumatic.  Mouth/Throat: Oropharynx is clear and moist. No oropharyngeal exudate.  Eyes: Conjunctivae and EOM are normal. Pupils are equal, round, and reactive to light. No scleral icterus.  Neck: Normal range of motion. Neck supple.  Cardiovascular: Normal rate and regular rhythm.  Exam reveals no gallop and no friction rub.   No murmur heard. Pulmonary/Chest: Effort normal and breath sounds normal. She has no wheezes. She has no rales. She exhibits no  tenderness.  Abdominal: Soft. She exhibits no distension. There is no tenderness. There is no rebound and no guarding.   Musculoskeletal: Normal range of motion.  Neurological: She is alert and oriented to person, place, and time. No cranial nerve deficit. Coordination normal.  Extremity strength and sensation equal and intact bilaterally. Speech is goal-oriented. Moves limbs without ataxia.   Skin: Skin is warm and dry.  Psychiatric: She has a normal mood and affect. Her behavior is normal.    ED Course  Procedures (including critical care time) Labs Review Labs Reviewed - No data to display Imaging Review Ct Head Wo Contrast  02/23/2013   CLINICAL DATA:  Headache  EXAM: CT HEAD WITHOUT CONTRAST  TECHNIQUE: Contiguous axial images were obtained from the base of the skull through the vertex without intravenous contrast.  COMPARISON:  None.  FINDINGS: Ventricle size is normal. Negative for acute infarct. Negative for hemorrhage or mass lesion. No edema or midline shift.  Calvarium intact. Paranasal sinuses are clear.  IMPRESSION: No acute intracranial abnormality.   Electronically Signed   By: Marlan Palau M.D.   On: 02/23/2013 14:04    MDM   1. Headache     10:56 AM Patient will have fluids and head CT for headache. Vitals stable and patient afebrile.   3:06 PM Patient's head CT unremarkable for acute changes. Patient reports some relief with toradol, reglan and benadryl. Patient will have dilaudid with reglan and benadryl. Patient will be discharged without further evaluation. No neuro deficits at this time.    Emilia Beck, PA-C 02/24/13 1021

## 2013-02-24 DIAGNOSIS — M542 Cervicalgia: Secondary | ICD-10-CM | POA: Diagnosis not present

## 2013-02-24 DIAGNOSIS — R51 Headache: Secondary | ICD-10-CM | POA: Diagnosis not present

## 2013-02-24 DIAGNOSIS — IMO0001 Reserved for inherently not codable concepts without codable children: Secondary | ICD-10-CM | POA: Diagnosis not present

## 2013-02-24 DIAGNOSIS — G518 Other disorders of facial nerve: Secondary | ICD-10-CM | POA: Diagnosis not present

## 2013-02-24 NOTE — ED Provider Notes (Signed)
Medical screening examination/treatment/procedure(s) were conducted as a shared visit with non-physician practitioner(s) and myself.  I personally evaluated the patient during the encounter  Pt without focal neuro deficits on my evaluation.  Pt had CT head as no recent neuroimaging ordered.  Pt stable for d/c home She was ambulatory in the ED  Joya Gaskins, MD 02/24/13 1115

## 2013-02-25 ENCOUNTER — Encounter (INDEPENDENT_AMBULATORY_CARE_PROVIDER_SITE_OTHER): Payer: Medicare Other | Admitting: Ophthalmology

## 2013-02-25 DIAGNOSIS — E1139 Type 2 diabetes mellitus with other diabetic ophthalmic complication: Secondary | ICD-10-CM | POA: Diagnosis not present

## 2013-02-25 DIAGNOSIS — H3581 Retinal edema: Secondary | ICD-10-CM

## 2013-03-01 ENCOUNTER — Encounter (HOSPITAL_COMMUNITY)
Admission: RE | Admit: 2013-03-01 | Discharge: 2013-03-01 | Disposition: A | Discharge status: Home / Self Care | Payer: Medicare Other | Source: Ambulatory Visit | Attending: Internal Medicine | Admitting: Internal Medicine

## 2013-03-01 DIAGNOSIS — M069 Rheumatoid arthritis, unspecified: Secondary | ICD-10-CM | POA: Diagnosis not present

## 2013-03-01 MED ORDER — SODIUM CHLORIDE 0.9 % IV SOLN
INTRAVENOUS | Status: DC
  Administered 2013-03-01: 10:00:00 via INTRAVENOUS

## 2013-03-01 MED ORDER — SODIUM CHLORIDE 0.9 % IV SOLN
1000.0000 mg | INTRAVENOUS | Status: DC
  Administered 2013-03-01: 1000 mg via INTRAVENOUS
  Filled 2013-03-01: qty 40

## 2013-03-11 DIAGNOSIS — IMO0001 Reserved for inherently not codable concepts without codable children: Secondary | ICD-10-CM | POA: Diagnosis not present

## 2013-03-11 DIAGNOSIS — M542 Cervicalgia: Secondary | ICD-10-CM | POA: Diagnosis not present

## 2013-03-11 DIAGNOSIS — R51 Headache: Secondary | ICD-10-CM | POA: Diagnosis not present

## 2013-03-11 DIAGNOSIS — G518 Other disorders of facial nerve: Secondary | ICD-10-CM | POA: Diagnosis not present

## 2013-03-15 DIAGNOSIS — M069 Rheumatoid arthritis, unspecified: Secondary | ICD-10-CM | POA: Diagnosis not present

## 2013-03-22 DIAGNOSIS — I10 Essential (primary) hypertension: Secondary | ICD-10-CM | POA: Diagnosis not present

## 2013-03-22 DIAGNOSIS — E7800 Pure hypercholesterolemia, unspecified: Secondary | ICD-10-CM | POA: Diagnosis not present

## 2013-03-22 DIAGNOSIS — M159 Polyosteoarthritis, unspecified: Secondary | ICD-10-CM | POA: Diagnosis not present

## 2013-03-22 DIAGNOSIS — J069 Acute upper respiratory infection, unspecified: Secondary | ICD-10-CM | POA: Diagnosis not present

## 2013-03-25 DIAGNOSIS — M542 Cervicalgia: Secondary | ICD-10-CM | POA: Diagnosis not present

## 2013-03-25 DIAGNOSIS — G518 Other disorders of facial nerve: Secondary | ICD-10-CM | POA: Diagnosis not present

## 2013-03-25 DIAGNOSIS — IMO0001 Reserved for inherently not codable concepts without codable children: Secondary | ICD-10-CM | POA: Diagnosis not present

## 2013-03-25 DIAGNOSIS — R51 Headache: Secondary | ICD-10-CM | POA: Diagnosis not present

## 2013-03-26 ENCOUNTER — Other Ambulatory Visit (HOSPITAL_COMMUNITY): Payer: Self-pay

## 2013-03-29 ENCOUNTER — Encounter (HOSPITAL_COMMUNITY)
Admission: RE | Admit: 2013-03-29 | Discharge: 2013-03-29 | Disposition: A | Discharge status: Home / Self Care | Payer: Medicare Other | Source: Ambulatory Visit | Attending: Internal Medicine | Admitting: Internal Medicine

## 2013-03-29 DIAGNOSIS — M069 Rheumatoid arthritis, unspecified: Secondary | ICD-10-CM | POA: Insufficient documentation

## 2013-03-29 MED ORDER — SODIUM CHLORIDE 0.9 % IV SOLN: INTRAVENOUS | Status: DC

## 2013-03-29 MED ORDER — SODIUM CHLORIDE 0.9 % IV SOLN
1000.0000 mg | INTRAVENOUS | Status: DC
  Filled 2013-03-29: qty 40

## 2013-03-29 NOTE — Progress Notes (Signed)
Pt has URI and been on z pack started it on 03/22/13 and last dose was 03/26/13.  Reschedule pt for one day next week per elizabeth at dr Tawana Scale office

## 2013-04-09 ENCOUNTER — Encounter (HOSPITAL_COMMUNITY): Payer: Medicare Other

## 2013-04-15 DIAGNOSIS — E1065 Type 1 diabetes mellitus with hyperglycemia: Secondary | ICD-10-CM | POA: Diagnosis not present

## 2013-04-15 DIAGNOSIS — J45901 Unspecified asthma with (acute) exacerbation: Secondary | ICD-10-CM | POA: Diagnosis not present

## 2013-04-15 DIAGNOSIS — R0602 Shortness of breath: Secondary | ICD-10-CM | POA: Diagnosis not present

## 2013-04-15 DIAGNOSIS — R05 Cough: Secondary | ICD-10-CM | POA: Diagnosis not present

## 2013-04-27 ENCOUNTER — Other Ambulatory Visit: Payer: Self-pay

## 2013-04-27 DIAGNOSIS — Z1231 Encounter for screening mammogram for malignant neoplasm of breast: Secondary | ICD-10-CM

## 2013-04-29 DIAGNOSIS — J45901 Unspecified asthma with (acute) exacerbation: Secondary | ICD-10-CM | POA: Diagnosis not present

## 2013-04-29 DIAGNOSIS — M159 Polyosteoarthritis, unspecified: Secondary | ICD-10-CM | POA: Diagnosis not present

## 2013-04-29 DIAGNOSIS — N390 Urinary tract infection, site not specified: Secondary | ICD-10-CM | POA: Diagnosis not present

## 2013-04-29 DIAGNOSIS — M549 Dorsalgia, unspecified: Secondary | ICD-10-CM | POA: Diagnosis not present

## 2013-04-29 DIAGNOSIS — M19049 Primary osteoarthritis, unspecified hand: Secondary | ICD-10-CM | POA: Diagnosis not present

## 2013-04-29 DIAGNOSIS — G5600 Carpal tunnel syndrome, unspecified upper limb: Secondary | ICD-10-CM | POA: Diagnosis not present

## 2013-05-05 ENCOUNTER — Encounter (HOSPITAL_COMMUNITY)
Admission: RE | Admit: 2013-05-05 | Discharge: 2013-05-05 | Disposition: A | Discharge status: Home / Self Care | Payer: Medicare Other | Source: Ambulatory Visit | Attending: Internal Medicine | Admitting: Internal Medicine

## 2013-05-05 DIAGNOSIS — R21 Rash and other nonspecific skin eruption: Secondary | ICD-10-CM | POA: Diagnosis not present

## 2013-05-05 DIAGNOSIS — M069 Rheumatoid arthritis, unspecified: Secondary | ICD-10-CM | POA: Insufficient documentation

## 2013-05-05 DIAGNOSIS — A599 Trichomoniasis, unspecified: Secondary | ICD-10-CM | POA: Diagnosis not present

## 2013-05-05 DIAGNOSIS — L708 Other acne: Secondary | ICD-10-CM | POA: Diagnosis not present

## 2013-05-05 MED ORDER — SODIUM CHLORIDE 0.9 % IV SOLN
INTRAVENOUS | Status: DC
  Administered 2013-05-05: 09:00:00 via INTRAVENOUS

## 2013-05-05 MED ORDER — SODIUM CHLORIDE 0.9 % IV SOLN
1000.0000 mg | INTRAVENOUS | Status: DC
  Administered 2013-05-05: 1000 mg via INTRAVENOUS
  Filled 2013-05-05: qty 40

## 2013-05-13 DIAGNOSIS — M069 Rheumatoid arthritis, unspecified: Secondary | ICD-10-CM | POA: Diagnosis not present

## 2013-05-13 DIAGNOSIS — R21 Rash and other nonspecific skin eruption: Secondary | ICD-10-CM | POA: Diagnosis not present

## 2013-05-13 DIAGNOSIS — IMO0001 Reserved for inherently not codable concepts without codable children: Secondary | ICD-10-CM | POA: Diagnosis not present

## 2013-05-13 DIAGNOSIS — G589 Mononeuropathy, unspecified: Secondary | ICD-10-CM | POA: Diagnosis not present

## 2013-05-25 DIAGNOSIS — G5600 Carpal tunnel syndrome, unspecified upper limb: Secondary | ICD-10-CM | POA: Diagnosis not present

## 2013-05-25 DIAGNOSIS — S43429A Sprain of unspecified rotator cuff capsule, initial encounter: Secondary | ICD-10-CM | POA: Diagnosis not present

## 2013-05-25 DIAGNOSIS — M67919 Unspecified disorder of synovium and tendon, unspecified shoulder: Secondary | ICD-10-CM | POA: Diagnosis not present

## 2013-05-27 ENCOUNTER — Ambulatory Visit
Admission: RE | Admit: 2013-05-27 | Discharge: 2013-05-27 | Disposition: A | Discharge status: Home / Self Care | Payer: Medicare Other | Source: Ambulatory Visit

## 2013-05-27 DIAGNOSIS — Z1231 Encounter for screening mammogram for malignant neoplasm of breast: Secondary | ICD-10-CM

## 2013-06-02 ENCOUNTER — Encounter (HOSPITAL_COMMUNITY): Payer: Medicare Other

## 2013-06-04 DIAGNOSIS — M19019 Primary osteoarthritis, unspecified shoulder: Secondary | ICD-10-CM | POA: Diagnosis not present

## 2013-06-09 ENCOUNTER — Encounter (HOSPITAL_COMMUNITY): Payer: Medicare Other

## 2013-06-14 DIAGNOSIS — M25519 Pain in unspecified shoulder: Secondary | ICD-10-CM | POA: Diagnosis not present

## 2013-06-14 DIAGNOSIS — D2110 Benign neoplasm of connective and other soft tissue of unspecified upper limb, including shoulder: Secondary | ICD-10-CM | POA: Diagnosis not present

## 2013-06-14 DIAGNOSIS — M719 Bursopathy, unspecified: Secondary | ICD-10-CM | POA: Diagnosis not present

## 2013-06-14 DIAGNOSIS — G5600 Carpal tunnel syndrome, unspecified upper limb: Secondary | ICD-10-CM | POA: Diagnosis not present

## 2013-06-14 DIAGNOSIS — M67919 Unspecified disorder of synovium and tendon, unspecified shoulder: Secondary | ICD-10-CM | POA: Diagnosis not present

## 2013-06-17 DIAGNOSIS — G5600 Carpal tunnel syndrome, unspecified upper limb: Secondary | ICD-10-CM | POA: Diagnosis not present

## 2013-06-28 ENCOUNTER — Ambulatory Visit (INDEPENDENT_AMBULATORY_CARE_PROVIDER_SITE_OTHER): Payer: Medicare Other | Admitting: Ophthalmology

## 2013-06-28 DIAGNOSIS — H2510 Age-related nuclear cataract, unspecified eye: Secondary | ICD-10-CM

## 2013-06-28 DIAGNOSIS — E1165 Type 2 diabetes mellitus with hyperglycemia: Secondary | ICD-10-CM | POA: Diagnosis not present

## 2013-06-28 DIAGNOSIS — E11319 Type 2 diabetes mellitus with unspecified diabetic retinopathy without macular edema: Secondary | ICD-10-CM

## 2013-06-28 DIAGNOSIS — I10 Essential (primary) hypertension: Secondary | ICD-10-CM

## 2013-06-28 DIAGNOSIS — E1139 Type 2 diabetes mellitus with other diabetic ophthalmic complication: Secondary | ICD-10-CM

## 2013-06-28 DIAGNOSIS — H35039 Hypertensive retinopathy, unspecified eye: Secondary | ICD-10-CM | POA: Diagnosis not present

## 2013-06-28 DIAGNOSIS — H43819 Vitreous degeneration, unspecified eye: Secondary | ICD-10-CM

## 2013-06-29 DIAGNOSIS — L280 Lichen simplex chronicus: Secondary | ICD-10-CM | POA: Diagnosis not present

## 2013-06-29 DIAGNOSIS — L259 Unspecified contact dermatitis, unspecified cause: Secondary | ICD-10-CM | POA: Diagnosis not present

## 2013-06-29 DIAGNOSIS — D485 Neoplasm of uncertain behavior of skin: Secondary | ICD-10-CM | POA: Diagnosis not present

## 2013-07-01 DIAGNOSIS — S61409A Unspecified open wound of unspecified hand, initial encounter: Secondary | ICD-10-CM | POA: Diagnosis not present

## 2013-07-01 DIAGNOSIS — G5600 Carpal tunnel syndrome, unspecified upper limb: Secondary | ICD-10-CM | POA: Diagnosis not present

## 2013-07-06 DIAGNOSIS — S61409A Unspecified open wound of unspecified hand, initial encounter: Secondary | ICD-10-CM | POA: Diagnosis not present

## 2013-07-06 DIAGNOSIS — G5600 Carpal tunnel syndrome, unspecified upper limb: Secondary | ICD-10-CM | POA: Diagnosis not present

## 2013-07-07 DIAGNOSIS — L408 Other psoriasis: Secondary | ICD-10-CM | POA: Diagnosis not present

## 2013-07-08 DIAGNOSIS — S61409A Unspecified open wound of unspecified hand, initial encounter: Secondary | ICD-10-CM | POA: Diagnosis not present

## 2013-07-08 DIAGNOSIS — G5600 Carpal tunnel syndrome, unspecified upper limb: Secondary | ICD-10-CM | POA: Diagnosis not present

## 2013-07-13 DIAGNOSIS — G5600 Carpal tunnel syndrome, unspecified upper limb: Secondary | ICD-10-CM | POA: Diagnosis not present

## 2013-07-13 DIAGNOSIS — S61409A Unspecified open wound of unspecified hand, initial encounter: Secondary | ICD-10-CM | POA: Diagnosis not present

## 2013-07-14 DIAGNOSIS — L259 Unspecified contact dermatitis, unspecified cause: Secondary | ICD-10-CM | POA: Diagnosis not present

## 2013-07-20 DIAGNOSIS — S61409A Unspecified open wound of unspecified hand, initial encounter: Secondary | ICD-10-CM | POA: Diagnosis not present

## 2013-07-20 DIAGNOSIS — G5600 Carpal tunnel syndrome, unspecified upper limb: Secondary | ICD-10-CM | POA: Diagnosis not present

## 2013-07-21 DIAGNOSIS — G609 Hereditary and idiopathic neuropathy, unspecified: Secondary | ICD-10-CM | POA: Diagnosis not present

## 2013-07-21 DIAGNOSIS — E1065 Type 1 diabetes mellitus with hyperglycemia: Secondary | ICD-10-CM | POA: Diagnosis not present

## 2013-07-21 DIAGNOSIS — I10 Essential (primary) hypertension: Secondary | ICD-10-CM | POA: Diagnosis not present

## 2013-07-21 DIAGNOSIS — IMO0002 Reserved for concepts with insufficient information to code with codable children: Secondary | ICD-10-CM | POA: Diagnosis not present

## 2013-08-09 DIAGNOSIS — L259 Unspecified contact dermatitis, unspecified cause: Secondary | ICD-10-CM | POA: Diagnosis not present

## 2013-08-09 DIAGNOSIS — I10 Essential (primary) hypertension: Secondary | ICD-10-CM | POA: Diagnosis not present

## 2013-08-09 DIAGNOSIS — Z0000 Encounter for general adult medical examination without abnormal findings: Secondary | ICD-10-CM | POA: Diagnosis not present

## 2013-08-09 DIAGNOSIS — Z1331 Encounter for screening for depression: Secondary | ICD-10-CM | POA: Diagnosis not present

## 2013-08-10 DIAGNOSIS — E538 Deficiency of other specified B group vitamins: Secondary | ICD-10-CM | POA: Diagnosis not present

## 2013-08-10 DIAGNOSIS — E7800 Pure hypercholesterolemia, unspecified: Secondary | ICD-10-CM | POA: Diagnosis not present

## 2013-08-10 DIAGNOSIS — E1065 Type 1 diabetes mellitus with hyperglycemia: Secondary | ICD-10-CM | POA: Diagnosis not present

## 2013-08-10 DIAGNOSIS — L408 Other psoriasis: Secondary | ICD-10-CM | POA: Diagnosis not present

## 2013-08-10 DIAGNOSIS — IMO0001 Reserved for inherently not codable concepts without codable children: Secondary | ICD-10-CM | POA: Diagnosis not present

## 2013-08-10 DIAGNOSIS — G589 Mononeuropathy, unspecified: Secondary | ICD-10-CM | POA: Diagnosis not present

## 2013-08-10 DIAGNOSIS — M069 Rheumatoid arthritis, unspecified: Secondary | ICD-10-CM | POA: Diagnosis not present

## 2013-08-10 DIAGNOSIS — R809 Proteinuria, unspecified: Secondary | ICD-10-CM | POA: Diagnosis not present

## 2013-08-10 DIAGNOSIS — IMO0002 Reserved for concepts with insufficient information to code with codable children: Secondary | ICD-10-CM | POA: Diagnosis not present

## 2013-08-13 DIAGNOSIS — E1065 Type 1 diabetes mellitus with hyperglycemia: Secondary | ICD-10-CM | POA: Diagnosis not present

## 2013-08-13 DIAGNOSIS — E7800 Pure hypercholesterolemia, unspecified: Secondary | ICD-10-CM | POA: Diagnosis not present

## 2013-08-13 DIAGNOSIS — Z23 Encounter for immunization: Secondary | ICD-10-CM | POA: Diagnosis not present

## 2013-08-13 DIAGNOSIS — IMO0002 Reserved for concepts with insufficient information to code with codable children: Secondary | ICD-10-CM | POA: Diagnosis not present

## 2013-08-13 DIAGNOSIS — I10 Essential (primary) hypertension: Secondary | ICD-10-CM | POA: Diagnosis not present

## 2013-08-13 DIAGNOSIS — E1049 Type 1 diabetes mellitus with other diabetic neurological complication: Secondary | ICD-10-CM | POA: Diagnosis not present

## 2013-08-13 DIAGNOSIS — G589 Mononeuropathy, unspecified: Secondary | ICD-10-CM | POA: Diagnosis not present

## 2013-08-18 ENCOUNTER — Other Ambulatory Visit (HOSPITAL_COMMUNITY)
Admission: RE | Admit: 2013-08-18 | Discharge: 2013-08-18 | Disposition: A | Discharge status: Home / Self Care | Payer: Medicare Other | Source: Ambulatory Visit | Attending: Internal Medicine | Admitting: Internal Medicine

## 2013-08-18 ENCOUNTER — Other Ambulatory Visit: Payer: Self-pay | Admitting: Registered Nurse

## 2013-08-18 DIAGNOSIS — Z113 Encounter for screening for infections with a predominantly sexual mode of transmission: Secondary | ICD-10-CM | POA: Diagnosis not present

## 2013-08-18 DIAGNOSIS — Z124 Encounter for screening for malignant neoplasm of cervix: Secondary | ICD-10-CM | POA: Diagnosis not present

## 2013-08-18 DIAGNOSIS — L259 Unspecified contact dermatitis, unspecified cause: Secondary | ICD-10-CM | POA: Diagnosis not present

## 2013-08-24 ENCOUNTER — Other Ambulatory Visit (HOSPITAL_COMMUNITY): Payer: Self-pay | Admitting: *Deleted

## 2013-08-24 ENCOUNTER — Encounter (HOSPITAL_COMMUNITY)
Admission: RE | Admit: 2013-08-24 | Discharge: 2013-08-24 | Disposition: A | Discharge status: Home / Self Care | Payer: Medicare Other | Source: Ambulatory Visit | Attending: Internal Medicine | Admitting: Internal Medicine

## 2013-08-24 DIAGNOSIS — M069 Rheumatoid arthritis, unspecified: Secondary | ICD-10-CM | POA: Insufficient documentation

## 2013-08-24 MED ORDER — SODIUM CHLORIDE 0.9 % IV SOLN
1000.0000 mg | INTRAVENOUS | Status: DC
  Administered 2013-08-24: 1000 mg via INTRAVENOUS
  Filled 2013-08-24: qty 40

## 2013-08-24 MED ORDER — SODIUM CHLORIDE 0.9 % IV SOLN
INTRAVENOUS | Status: DC
  Administered 2013-08-24: 10:00:00 via INTRAVENOUS

## 2013-08-27 DIAGNOSIS — M62838 Other muscle spasm: Secondary | ICD-10-CM | POA: Diagnosis not present

## 2013-08-27 DIAGNOSIS — G43719 Chronic migraine without aura, intractable, without status migrainosus: Secondary | ICD-10-CM | POA: Diagnosis not present

## 2013-08-27 DIAGNOSIS — G43839 Menstrual migraine, intractable, without status migrainosus: Secondary | ICD-10-CM | POA: Diagnosis not present

## 2013-08-31 DIAGNOSIS — L408 Other psoriasis: Secondary | ICD-10-CM | POA: Diagnosis not present

## 2013-09-06 DIAGNOSIS — J069 Acute upper respiratory infection, unspecified: Secondary | ICD-10-CM | POA: Diagnosis not present

## 2013-09-13 DIAGNOSIS — R21 Rash and other nonspecific skin eruption: Secondary | ICD-10-CM | POA: Diagnosis not present

## 2013-09-21 ENCOUNTER — Encounter (HOSPITAL_COMMUNITY)
Admission: RE | Admit: 2013-09-21 | Discharge: 2013-09-21 | Disposition: A | Discharge status: Home / Self Care | Payer: Medicare Other | Source: Ambulatory Visit | Attending: Internal Medicine | Admitting: Internal Medicine

## 2013-09-21 DIAGNOSIS — M069 Rheumatoid arthritis, unspecified: Secondary | ICD-10-CM | POA: Insufficient documentation

## 2013-09-21 MED ORDER — SODIUM CHLORIDE 0.9 % IV SOLN
1000.0000 mg | INTRAVENOUS | Status: DC
  Administered 2013-09-21: 1000 mg via INTRAVENOUS
  Filled 2013-09-21: qty 40

## 2013-09-21 MED ORDER — SODIUM CHLORIDE 0.9 % IV SOLN
INTRAVENOUS | Status: DC
  Administered 2013-09-21: 10:00:00 via INTRAVENOUS

## 2013-09-23 DIAGNOSIS — G5600 Carpal tunnel syndrome, unspecified upper limb: Secondary | ICD-10-CM | POA: Diagnosis not present

## 2013-09-29 DIAGNOSIS — G5600 Carpal tunnel syndrome, unspecified upper limb: Secondary | ICD-10-CM | POA: Diagnosis not present

## 2013-10-04 DIAGNOSIS — L408 Other psoriasis: Secondary | ICD-10-CM | POA: Diagnosis not present

## 2013-10-06 DIAGNOSIS — G5600 Carpal tunnel syndrome, unspecified upper limb: Secondary | ICD-10-CM | POA: Diagnosis not present

## 2013-10-11 DIAGNOSIS — L408 Other psoriasis: Secondary | ICD-10-CM | POA: Diagnosis not present

## 2013-10-19 ENCOUNTER — Encounter (HOSPITAL_COMMUNITY): Payer: Medicare Other

## 2013-10-19 DIAGNOSIS — G5600 Carpal tunnel syndrome, unspecified upper limb: Secondary | ICD-10-CM | POA: Diagnosis not present

## 2013-10-21 DIAGNOSIS — G5600 Carpal tunnel syndrome, unspecified upper limb: Secondary | ICD-10-CM | POA: Diagnosis not present

## 2013-10-22 DIAGNOSIS — J309 Allergic rhinitis, unspecified: Secondary | ICD-10-CM | POA: Diagnosis not present

## 2013-10-22 DIAGNOSIS — H1045 Other chronic allergic conjunctivitis: Secondary | ICD-10-CM | POA: Diagnosis not present

## 2013-10-25 ENCOUNTER — Other Ambulatory Visit (HOSPITAL_COMMUNITY): Payer: Self-pay | Admitting: *Deleted

## 2013-10-26 ENCOUNTER — Encounter (HOSPITAL_COMMUNITY)
Admission: RE | Admit: 2013-10-26 | Discharge: 2013-10-26 | Disposition: A | Discharge status: Home / Self Care | Payer: Medicare Other | Source: Ambulatory Visit | Attending: Internal Medicine | Admitting: Internal Medicine

## 2013-10-26 DIAGNOSIS — L408 Other psoriasis: Secondary | ICD-10-CM | POA: Diagnosis not present

## 2013-10-26 DIAGNOSIS — M069 Rheumatoid arthritis, unspecified: Secondary | ICD-10-CM | POA: Insufficient documentation

## 2013-10-26 MED ORDER — SODIUM CHLORIDE 0.9 % IV SOLN
INTRAVENOUS | Status: DC
  Administered 2013-10-26: 250 mL via INTRAVENOUS

## 2013-10-26 MED ORDER — SODIUM CHLORIDE 0.9 % IV SOLN
1000.0000 mg | INTRAVENOUS | Status: DC
  Administered 2013-10-26: 1000 mg via INTRAVENOUS
  Filled 2013-10-26: qty 40

## 2013-11-02 DIAGNOSIS — L708 Other acne: Secondary | ICD-10-CM | POA: Diagnosis not present

## 2013-11-02 DIAGNOSIS — L408 Other psoriasis: Secondary | ICD-10-CM | POA: Diagnosis not present

## 2013-11-10 DIAGNOSIS — L408 Other psoriasis: Secondary | ICD-10-CM | POA: Diagnosis not present

## 2013-11-11 DIAGNOSIS — G589 Mononeuropathy, unspecified: Secondary | ICD-10-CM | POA: Diagnosis not present

## 2013-11-11 DIAGNOSIS — IMO0001 Reserved for inherently not codable concepts without codable children: Secondary | ICD-10-CM | POA: Diagnosis not present

## 2013-11-11 DIAGNOSIS — L408 Other psoriasis: Secondary | ICD-10-CM | POA: Diagnosis not present

## 2013-11-11 DIAGNOSIS — M069 Rheumatoid arthritis, unspecified: Secondary | ICD-10-CM | POA: Diagnosis not present

## 2013-11-23 ENCOUNTER — Encounter (HOSPITAL_COMMUNITY): Payer: Medicare Other

## 2013-12-06 ENCOUNTER — Other Ambulatory Visit (HOSPITAL_COMMUNITY): Payer: Self-pay | Admitting: *Deleted

## 2013-12-07 ENCOUNTER — Encounter (HOSPITAL_COMMUNITY)
Admission: RE | Admit: 2013-12-07 | Discharge: 2013-12-07 | Disposition: A | Discharge status: Home / Self Care | Payer: Medicare Other | Source: Ambulatory Visit | Attending: Internal Medicine | Admitting: Internal Medicine

## 2013-12-07 DIAGNOSIS — M069 Rheumatoid arthritis, unspecified: Secondary | ICD-10-CM | POA: Diagnosis not present

## 2013-12-07 MED ORDER — SODIUM CHLORIDE 0.9 % IV SOLN
INTRAVENOUS | Status: AC
  Administered 2013-12-07: 10:00:00 via INTRAVENOUS

## 2013-12-07 MED ORDER — SODIUM CHLORIDE 0.9 % IV SOLN
1000.0000 mg | INTRAVENOUS | Status: DC
  Administered 2013-12-07: 1000 mg via INTRAVENOUS
  Filled 2013-12-07: qty 40

## 2013-12-09 DIAGNOSIS — L408 Other psoriasis: Secondary | ICD-10-CM | POA: Diagnosis not present

## 2013-12-14 DIAGNOSIS — G5600 Carpal tunnel syndrome, unspecified upper limb: Secondary | ICD-10-CM | POA: Diagnosis not present

## 2013-12-20 DIAGNOSIS — IMO0002 Reserved for concepts with insufficient information to code with codable children: Secondary | ICD-10-CM | POA: Diagnosis not present

## 2013-12-27 ENCOUNTER — Ambulatory Visit (INDEPENDENT_AMBULATORY_CARE_PROVIDER_SITE_OTHER): Payer: Medicare Other | Admitting: Ophthalmology

## 2013-12-27 DIAGNOSIS — E1165 Type 2 diabetes mellitus with hyperglycemia: Secondary | ICD-10-CM | POA: Diagnosis not present

## 2013-12-27 DIAGNOSIS — E11319 Type 2 diabetes mellitus with unspecified diabetic retinopathy without macular edema: Secondary | ICD-10-CM

## 2013-12-27 DIAGNOSIS — I10 Essential (primary) hypertension: Secondary | ICD-10-CM

## 2013-12-27 DIAGNOSIS — H3581 Retinal edema: Secondary | ICD-10-CM

## 2013-12-27 DIAGNOSIS — E1139 Type 2 diabetes mellitus with other diabetic ophthalmic complication: Secondary | ICD-10-CM

## 2013-12-27 DIAGNOSIS — H43819 Vitreous degeneration, unspecified eye: Secondary | ICD-10-CM

## 2013-12-27 DIAGNOSIS — H35039 Hypertensive retinopathy, unspecified eye: Secondary | ICD-10-CM

## 2013-12-29 DIAGNOSIS — L408 Other psoriasis: Secondary | ICD-10-CM | POA: Diagnosis not present

## 2013-12-30 DIAGNOSIS — I10 Essential (primary) hypertension: Secondary | ICD-10-CM | POA: Diagnosis not present

## 2013-12-30 DIAGNOSIS — G609 Hereditary and idiopathic neuropathy, unspecified: Secondary | ICD-10-CM | POA: Diagnosis not present

## 2013-12-30 DIAGNOSIS — IMO0002 Reserved for concepts with insufficient information to code with codable children: Secondary | ICD-10-CM | POA: Diagnosis not present

## 2013-12-30 DIAGNOSIS — E1049 Type 1 diabetes mellitus with other diabetic neurological complication: Secondary | ICD-10-CM | POA: Diagnosis not present

## 2013-12-30 DIAGNOSIS — E1065 Type 1 diabetes mellitus with hyperglycemia: Secondary | ICD-10-CM | POA: Diagnosis not present

## 2013-12-30 DIAGNOSIS — E7800 Pure hypercholesterolemia, unspecified: Secondary | ICD-10-CM | POA: Diagnosis not present

## 2014-01-03 ENCOUNTER — Other Ambulatory Visit (HOSPITAL_COMMUNITY): Payer: Self-pay | Admitting: *Deleted

## 2014-01-04 ENCOUNTER — Encounter (HOSPITAL_COMMUNITY)
Admission: RE | Admit: 2014-01-04 | Discharge: 2014-01-04 | Disposition: A | Discharge status: Home / Self Care | Payer: Medicare Other | Source: Ambulatory Visit | Attending: Internal Medicine | Admitting: Internal Medicine

## 2014-01-04 DIAGNOSIS — M069 Rheumatoid arthritis, unspecified: Secondary | ICD-10-CM | POA: Insufficient documentation

## 2014-01-04 MED ORDER — SODIUM CHLORIDE 0.9 % IV SOLN
INTRAVENOUS | Status: AC
  Administered 2014-01-04: 09:00:00 via INTRAVENOUS

## 2014-01-04 MED ORDER — SODIUM CHLORIDE 0.9 % IV SOLN
1000.0000 mg | INTRAVENOUS | Status: AC
  Administered 2014-01-04: 1000 mg via INTRAVENOUS
  Filled 2014-01-04: qty 40

## 2014-01-12 DIAGNOSIS — G43719 Chronic migraine without aura, intractable, without status migrainosus: Secondary | ICD-10-CM | POA: Diagnosis not present

## 2014-01-19 DIAGNOSIS — L408 Other psoriasis: Secondary | ICD-10-CM | POA: Diagnosis not present

## 2014-02-01 ENCOUNTER — Encounter (HOSPITAL_COMMUNITY)
Admission: RE | Admit: 2014-02-01 | Discharge: 2014-02-01 | Disposition: A | Discharge status: Home / Self Care | Payer: Medicare Other | Source: Ambulatory Visit | Attending: Internal Medicine | Admitting: Internal Medicine

## 2014-02-01 DIAGNOSIS — M069 Rheumatoid arthritis, unspecified: Secondary | ICD-10-CM | POA: Diagnosis not present

## 2014-02-01 MED ORDER — SODIUM CHLORIDE 0.9 % IV SOLN
1000.0000 mg | INTRAVENOUS | Status: DC
  Administered 2014-02-01: 1000 mg via INTRAVENOUS
  Filled 2014-02-01: qty 40

## 2014-02-01 MED ORDER — SODIUM CHLORIDE 0.9 % IV SOLN
INTRAVENOUS | Status: DC
  Administered 2014-02-01: 10:00:00 via INTRAVENOUS

## 2014-02-02 DIAGNOSIS — L408 Other psoriasis: Secondary | ICD-10-CM | POA: Diagnosis not present

## 2014-02-09 DIAGNOSIS — E1139 Type 2 diabetes mellitus with other diabetic ophthalmic complication: Secondary | ICD-10-CM | POA: Diagnosis not present

## 2014-02-10 DIAGNOSIS — I10 Essential (primary) hypertension: Secondary | ICD-10-CM | POA: Diagnosis not present

## 2014-02-10 DIAGNOSIS — E1049 Type 1 diabetes mellitus with other diabetic neurological complication: Secondary | ICD-10-CM | POA: Diagnosis not present

## 2014-02-10 DIAGNOSIS — R5381 Other malaise: Secondary | ICD-10-CM | POA: Diagnosis not present

## 2014-02-10 DIAGNOSIS — R5383 Other fatigue: Secondary | ICD-10-CM | POA: Diagnosis not present

## 2014-02-11 DIAGNOSIS — M25549 Pain in joints of unspecified hand: Secondary | ICD-10-CM | POA: Diagnosis not present

## 2014-02-11 DIAGNOSIS — L408 Other psoriasis: Secondary | ICD-10-CM | POA: Diagnosis not present

## 2014-02-11 DIAGNOSIS — M069 Rheumatoid arthritis, unspecified: Secondary | ICD-10-CM | POA: Diagnosis not present

## 2014-02-11 DIAGNOSIS — G589 Mononeuropathy, unspecified: Secondary | ICD-10-CM | POA: Diagnosis not present

## 2014-02-11 DIAGNOSIS — IMO0001 Reserved for inherently not codable concepts without codable children: Secondary | ICD-10-CM | POA: Diagnosis not present

## 2014-02-15 ENCOUNTER — Telehealth: Payer: Self-pay | Admitting: Oncology

## 2014-02-15 DIAGNOSIS — J90 Pleural effusion, not elsewhere classified: Secondary | ICD-10-CM | POA: Diagnosis not present

## 2014-02-15 DIAGNOSIS — D649 Anemia, unspecified: Secondary | ICD-10-CM | POA: Diagnosis not present

## 2014-02-15 DIAGNOSIS — D569 Thalassemia, unspecified: Secondary | ICD-10-CM | POA: Diagnosis not present

## 2014-02-15 DIAGNOSIS — R0602 Shortness of breath: Secondary | ICD-10-CM | POA: Diagnosis not present

## 2014-02-15 DIAGNOSIS — J811 Chronic pulmonary edema: Secondary | ICD-10-CM | POA: Diagnosis not present

## 2014-02-15 DIAGNOSIS — R609 Edema, unspecified: Secondary | ICD-10-CM | POA: Diagnosis not present

## 2014-02-15 DIAGNOSIS — I509 Heart failure, unspecified: Secondary | ICD-10-CM | POA: Diagnosis not present

## 2014-02-15 NOTE — Telephone Encounter (Signed)
PATIENT CALLED TO R/S NO-SHOW APPT 08/12/12 TO 09/09 @ 3 W/DR. SHADAD. CALLED DR. Chase Caller OFFICE TO [facility]

## 2014-02-16 ENCOUNTER — Telehealth: Payer: Self-pay | Admitting: Oncology

## 2014-02-16 ENCOUNTER — Telehealth: Payer: Self-pay | Admitting: *Deleted

## 2014-02-16 ENCOUNTER — Ambulatory Visit (HOSPITAL_BASED_OUTPATIENT_CLINIC_OR_DEPARTMENT_OTHER): Payer: Medicare Other | Admitting: Oncology

## 2014-02-16 ENCOUNTER — Encounter: Payer: Self-pay | Admitting: Oncology

## 2014-02-16 VITALS — BP 165/69 | HR 87 | Temp 98.7°F | Resp 19 | Ht 63.0 in | Wt 294.0 lb

## 2014-02-16 DIAGNOSIS — D509 Iron deficiency anemia, unspecified: Secondary | ICD-10-CM

## 2014-02-16 DIAGNOSIS — D539 Nutritional anemia, unspecified: Secondary | ICD-10-CM

## 2014-02-16 DIAGNOSIS — D561 Beta thalassemia: Secondary | ICD-10-CM

## 2014-02-16 NOTE — Telephone Encounter (Signed)
Pt confirmed labs/ov per 09/09 POF, gave pt AVS....KJ

## 2014-02-16 NOTE — Telephone Encounter (Signed)
Per staff message and POF I have scheduled appts. Advised scheduler of appts. JMW  

## 2014-02-16 NOTE — Progress Notes (Signed)
Hematology and Oncology Follow Up Visit  Jessica Howard [id number] 1960/07/31 53 y.o. 02/16/2014 3:14 PM  CC: Merrilee Seashore, M.D.  [name], M.D.  Principle Diagnosis: This is a 53 year old female with microcytic anemia due to beta thalassemia.  She has element of iron deficiency due to heavy menstrual bleeding.  Interim History:  [name] presents today for a follow-up visit.  She is a very pleasant 53 year old female with multiple comorbid conditions that include diabetes, rheumatoid arthritis and as mentioned mild anemia. I have not seen her since 2013 but asked to be evaluated today related to worsening anemia. She noticed some increase in her fatigue levels and lack of energy. She was also noted to have some exertional dyspnea as well. She reported ice cravings on a consistent basis. She had CBC done on 02/10/2014 which showed a hemoglobin of 9.3 MCV 66.7 indicating worsening iron deficiency.  She does have diffuse pain all over, again related to her rheumatoid arthritis.  She is still continuing to have heavy menstrual cycles lasting five days with clots at times.   She did not report headaches or blurry vision or syncope. She did not report any chest pain or palpitation. Does not report any frequency urgency or hesitancy. She does not report any hematuria hematochezia or melena. Rest of her review of systems unremarkable.   Medications: I have reviewed the patient's current medications.  Current Outpatient Prescriptions  Medication Sig Dispense Refill  . abatacept (ORENCIA) 250 MG injection Inject 1,000 mg into the vein every 28 (twenty-eight) days. Monthly Infusion      . amitriptyline (ELAVIL) 25 MG tablet Take 25 mg by mouth daily.       . baclofen (LIORESAL) 10 MG tablet Take 10 mg by mouth See admin instructions. May take 10 mg twice weekly if needed for headaches      . BD INSULIN SYRINGE ULTRAFINE 31G X 15/64" 1 ML MISC       . chlorthalidone (HYGROTON) 25 MG  tablet Take 25 mg by mouth daily.      Marland Kitchen diltiazem (CARDIZEM CD) 240 MG 24 hr capsule       . diltiazem (DILACOR XR) 240 MG 24 hr capsule Take 240 mg by mouth daily.      Marland Kitchen doxycycline (VIBRAMYCIN) 100 MG capsule       . gabapentin (NEURONTIN) 100 MG capsule Take 100 mg by mouth 3 (three) times daily.      Marland Kitchen HYDROcodone-acetaminophen (VICODIN) 5-500 MG per tablet Take 1 tablet by mouth every 6 (six) hours as needed for pain.      Marland Kitchen insulin lispro (HUMALOG) 100 UNIT/ML injection Inject 20 Units into the skin 3 (three) times daily before meals.      . insulin NPH (HUMULIN N,NOVOLIN N) 100 UNIT/ML injection Inject 40-75 Units into the skin 3 (three) times daily with meals. 75 units with breakfast; lunch 75 units; 75 units at dinner      . irbesartan (AVAPRO) 300 MG tablet Take 300 mg by mouth daily.       . Methotrexate Sodium, PF, 50 MG/2ML SOLN Take 0.8 mg by mouth.      . Nebivolol HCl (BYSTOLIC) 20 MG TABS Take 20 mg by mouth daily.      . pantoprazole (PROTONIX) 40 MG tablet Take 40 mg by mouth daily.      . prednisoLONE acetate (PRED FORTE) 1 % ophthalmic suspension       . predniSONE (DELTASONE) 5 MG tablet Take  5 mg by mouth daily.      . rosuvastatin (CRESTOR) 10 MG tablet Take 10 mg by mouth daily.      Marland Kitchen tiZANidine (ZANAFLEX) 4 MG tablet       . topiramate (TOPAMAX) 25 MG tablet Take 75 mg by mouth at bedtime. Migraine preventative      . VOLTAREN 1 % GEL        No current facility-administered medications for this visit.    Allergies:  Allergies  Allergen Reactions  . Bactrim [Sulfamethoxazole-Tmp Ds] Hives  . Cefuroxime Axetil Itching  . Cephalosporins Itching  . Lisinopril Cough  . Penicillins Hives  . Sulfa Antibiotics Hives  . Iohexol Itching and Rash     Code: RASH, Desc: HAD ITCHING AND A RASH ABOUT ONE HOUR AFTER RETURNING HOME FROM THE CT, Onset Date: [id number]     Past Medical History, Surgical history, Social history, and Family History were reviewed and  updated.   Blood pressure 165/69, pulse 87, temperature 98.7 F (37.1 C), temperature source Oral, resp. rate 19, height 5\' 3"  (1.6 m), weight 294 lb (133.358 kg). ECOG: 1 General appearance: alert is not in any distress. Head: Normocephalic, without obvious abnormality Neck: no adenopathy Lymph nodes: Cervical, supraclavicular, and axillary nodes normal. Heart:regular rate and rhythm, S1, S2 normal, no murmur, click, rub or gallop Lung:chest clear, no wheezing, rales, normal symmetric air entry Abdomin: soft, non-tender, without masses or organomegaly EXT:no erythema, induration, or nodules   02/10/2014 showed a CBC which showed white cell count of 9.5, hemoglobin of 9.5 MCV 66.7 platelet count of 356. Creatinine was 1.0 normal liver function tests.   Impression and Plan:  A 53 year old female with the following issues: 1. Microcytic anemia related to her beta thalassemia as well as iron deficiency. Hemoglobin has drifted a low since the last visit and appears to be more symptomatic. I discussed with her the risks and benefits of IV iron replacement. A drug like feraheme would be a reasonable option for iron replacement. Complications of this showed that includes arthralgias, as myalgias infusion related toxicity and rarely anaphylaxis were discussed and she is agreeable to proceed. We'll set that up for her in the near future. 2. Followup will be in 2 months to assess response to IV.  [facility], MD 9/9/20153:14 PM

## 2014-02-18 ENCOUNTER — Ambulatory Visit (HOSPITAL_BASED_OUTPATIENT_CLINIC_OR_DEPARTMENT_OTHER): Payer: Medicare Other

## 2014-02-18 ENCOUNTER — Other Ambulatory Visit: Payer: Self-pay

## 2014-02-18 ENCOUNTER — Ambulatory Visit (HOSPITAL_BASED_OUTPATIENT_CLINIC_OR_DEPARTMENT_OTHER): Payer: Medicare Other | Admitting: Nurse Practitioner

## 2014-02-18 VITALS — BP 135/71 | HR 68 | Temp 98.7°F | Resp 18

## 2014-02-18 DIAGNOSIS — D509 Iron deficiency anemia, unspecified: Secondary | ICD-10-CM | POA: Diagnosis not present

## 2014-02-18 DIAGNOSIS — R079 Chest pain, unspecified: Secondary | ICD-10-CM | POA: Diagnosis not present

## 2014-02-18 MED ORDER — SODIUM CHLORIDE 0.9 % IV SOLN
1020.0000 mg | Freq: Once | INTRAVENOUS | Status: AC
  Administered 2014-02-18: 1020 mg via INTRAVENOUS
  Filled 2014-02-18: qty 34

## 2014-02-18 MED ORDER — SODIUM CHLORIDE 0.9 % IV SOLN
500.0000 mL | INTRAVENOUS | Status: DC
  Administered 2014-02-18: 14:00:00 via INTRAVENOUS

## 2014-02-18 MED ORDER — SODIUM CHLORIDE 0.9 % IV SOLN
Freq: Once | INTRAVENOUS | Status: AC
  Administered 2014-02-18: 12:00:00 via INTRAVENOUS

## 2014-02-18 NOTE — Progress Notes (Signed)
1308-Pt reports chest pressure with approx 10cc off feraheme left to infuse. Feraheme stopped.Denies pain ,nausea, vomiting. VSS. Oxygen sat 100% prior to feraheme-during chest pressure episode sat dropped to 94%.Oxygen 1 liter applied and oxygen sat up to 100%. Cyndee bacon in to see pt. 1320- pressure down to  5/10

## 2014-02-21 DIAGNOSIS — I10 Essential (primary) hypertension: Secondary | ICD-10-CM | POA: Diagnosis not present

## 2014-02-21 DIAGNOSIS — R609 Edema, unspecified: Secondary | ICD-10-CM | POA: Diagnosis not present

## 2014-02-21 DIAGNOSIS — I509 Heart failure, unspecified: Secondary | ICD-10-CM | POA: Diagnosis not present

## 2014-02-21 DIAGNOSIS — M549 Dorsalgia, unspecified: Secondary | ICD-10-CM | POA: Diagnosis not present

## 2014-02-22 DIAGNOSIS — L723 Sebaceous cyst: Secondary | ICD-10-CM | POA: Diagnosis not present

## 2014-02-22 DIAGNOSIS — L439 Lichen planus, unspecified: Secondary | ICD-10-CM | POA: Diagnosis not present

## 2014-02-24 ENCOUNTER — Encounter (INDEPENDENT_AMBULATORY_CARE_PROVIDER_SITE_OTHER): Payer: Medicare Other | Admitting: Ophthalmology

## 2014-02-24 DIAGNOSIS — E11319 Type 2 diabetes mellitus with unspecified diabetic retinopathy without macular edema: Secondary | ICD-10-CM | POA: Diagnosis not present

## 2014-02-24 DIAGNOSIS — H35039 Hypertensive retinopathy, unspecified eye: Secondary | ICD-10-CM | POA: Diagnosis not present

## 2014-02-24 DIAGNOSIS — H2510 Age-related nuclear cataract, unspecified eye: Secondary | ICD-10-CM

## 2014-02-24 DIAGNOSIS — I10 Essential (primary) hypertension: Secondary | ICD-10-CM | POA: Diagnosis not present

## 2014-02-24 DIAGNOSIS — E1165 Type 2 diabetes mellitus with hyperglycemia: Secondary | ICD-10-CM

## 2014-02-24 DIAGNOSIS — E1139 Type 2 diabetes mellitus with other diabetic ophthalmic complication: Secondary | ICD-10-CM

## 2014-02-24 DIAGNOSIS — H43819 Vitreous degeneration, unspecified eye: Secondary | ICD-10-CM

## 2014-02-27 ENCOUNTER — Encounter (HOSPITAL_COMMUNITY): Payer: Self-pay | Admitting: Emergency Medicine

## 2014-02-27 ENCOUNTER — Observation Stay (HOSPITAL_COMMUNITY)
Admission: EM | Admit: 2014-02-27 | Discharge: 2014-03-01 | Disposition: A | Discharge status: Home / Self Care | Payer: Medicare Other | Attending: Internal Medicine | Admitting: Internal Medicine

## 2014-02-27 ENCOUNTER — Emergency Department (HOSPITAL_COMMUNITY): Payer: Medicare Other

## 2014-02-27 DIAGNOSIS — R079 Chest pain, unspecified: Secondary | ICD-10-CM | POA: Diagnosis not present

## 2014-02-27 DIAGNOSIS — Z880 Allergy status to penicillin: Secondary | ICD-10-CM | POA: Diagnosis not present

## 2014-02-27 DIAGNOSIS — G8929 Other chronic pain: Secondary | ICD-10-CM | POA: Diagnosis not present

## 2014-02-27 DIAGNOSIS — Z853 Personal history of malignant neoplasm of breast: Secondary | ICD-10-CM | POA: Insufficient documentation

## 2014-02-27 DIAGNOSIS — F17200 Nicotine dependence, unspecified, uncomplicated: Secondary | ICD-10-CM | POA: Diagnosis not present

## 2014-02-27 DIAGNOSIS — I5033 Acute on chronic diastolic (congestive) heart failure: Secondary | ICD-10-CM

## 2014-02-27 DIAGNOSIS — D539 Nutritional anemia, unspecified: Secondary | ICD-10-CM | POA: Insufficient documentation

## 2014-02-27 DIAGNOSIS — M545 Low back pain, unspecified: Secondary | ICD-10-CM | POA: Insufficient documentation

## 2014-02-27 DIAGNOSIS — I10 Essential (primary) hypertension: Secondary | ICD-10-CM | POA: Diagnosis present

## 2014-02-27 DIAGNOSIS — R072 Precordial pain: Secondary | ICD-10-CM | POA: Diagnosis not present

## 2014-02-27 DIAGNOSIS — R609 Edema, unspecified: Secondary | ICD-10-CM | POA: Diagnosis not present

## 2014-02-27 DIAGNOSIS — M069 Rheumatoid arthritis, unspecified: Secondary | ICD-10-CM | POA: Insufficient documentation

## 2014-02-27 DIAGNOSIS — G4730 Sleep apnea, unspecified: Secondary | ICD-10-CM | POA: Diagnosis not present

## 2014-02-27 DIAGNOSIS — D509 Iron deficiency anemia, unspecified: Secondary | ICD-10-CM

## 2014-02-27 DIAGNOSIS — IMO0002 Reserved for concepts with insufficient information to code with codable children: Secondary | ICD-10-CM | POA: Insufficient documentation

## 2014-02-27 DIAGNOSIS — R0602 Shortness of breath: Secondary | ICD-10-CM | POA: Diagnosis not present

## 2014-02-27 DIAGNOSIS — Z79899 Other long term (current) drug therapy: Secondary | ICD-10-CM | POA: Diagnosis not present

## 2014-02-27 DIAGNOSIS — D569 Thalassemia, unspecified: Secondary | ICD-10-CM

## 2014-02-27 DIAGNOSIS — M549 Dorsalgia, unspecified: Secondary | ICD-10-CM

## 2014-02-27 DIAGNOSIS — E785 Hyperlipidemia, unspecified: Secondary | ICD-10-CM | POA: Diagnosis not present

## 2014-02-27 DIAGNOSIS — K7689 Other specified diseases of liver: Secondary | ICD-10-CM | POA: Insufficient documentation

## 2014-02-27 DIAGNOSIS — Z794 Long term (current) use of insulin: Secondary | ICD-10-CM | POA: Insufficient documentation

## 2014-02-27 DIAGNOSIS — N179 Acute kidney failure, unspecified: Secondary | ICD-10-CM

## 2014-02-27 DIAGNOSIS — N289 Disorder of kidney and ureter, unspecified: Secondary | ICD-10-CM | POA: Diagnosis present

## 2014-02-27 DIAGNOSIS — E119 Type 2 diabetes mellitus without complications: Secondary | ICD-10-CM | POA: Insufficient documentation

## 2014-02-27 DIAGNOSIS — G4733 Obstructive sleep apnea (adult) (pediatric): Secondary | ICD-10-CM | POA: Diagnosis present

## 2014-02-27 DIAGNOSIS — Z7952 Long term (current) use of systemic steroids: Secondary | ICD-10-CM

## 2014-02-27 LAB — CBC
HCT: 29.2 % — ABNORMAL LOW (ref 36.0–46.0)
Hemoglobin: 9.4 g/dL — ABNORMAL LOW (ref 12.0–15.0)
MCH: 21.3 pg — AB (ref 26.0–34.0)
MCHC: 32.2 g/dL (ref 30.0–36.0)
MCV: 66.1 fL — ABNORMAL LOW (ref 78.0–100.0)
PLATELETS: 334 10*3/uL (ref 150–400)
RBC: 4.42 MIL/uL (ref 3.87–5.11)
RDW: 16.6 % — ABNORMAL HIGH (ref 11.5–15.5)
WBC: 12.7 10*3/uL — ABNORMAL HIGH (ref 4.0–10.5)

## 2014-02-27 LAB — BASIC METABOLIC PANEL
ANION GAP: 14 (ref 5–15)
BUN: 29 mg/dL — AB (ref 6–23)
CALCIUM: 8.9 mg/dL (ref 8.4–10.5)
CO2: 23 mEq/L (ref 19–32)
Chloride: 102 mEq/L (ref 96–112)
Creatinine, Ser: 1.29 mg/dL — ABNORMAL HIGH (ref 0.50–1.10)
GFR calc Af Amer: 55 mL/min — ABNORMAL LOW (ref >90)
GFR calc non Af Amer: 47 mL/min — ABNORMAL LOW (ref >90)
GLUCOSE: 87 mg/dL (ref 70–99)
Potassium: 4.7 mEq/L (ref 3.7–5.3)
Sodium: 139 mEq/L (ref 137–147)

## 2014-02-27 LAB — I-STAT TROPONIN, ED
Troponin i, poc: 0.01 ng/mL (ref 0.00–0.08)
Troponin i, poc: 0 ng/mL (ref 0.00–0.08)

## 2014-02-27 LAB — PRO B NATRIURETIC PEPTIDE: Pro B Natriuretic peptide (BNP): 156.1 pg/mL — ABNORMAL HIGH (ref 0–125)

## 2014-02-27 MED ORDER — ASPIRIN 81 MG PO CHEW
324.0000 mg | CHEWABLE_TABLET | Freq: Once | ORAL | Status: AC
  Administered 2014-02-27: 324 mg via ORAL
  Filled 2014-02-27: qty 4

## 2014-02-27 MED ORDER — MORPHINE SULFATE 4 MG/ML IJ SOLN: 4.0000 mg | Freq: Once | INTRAMUSCULAR | Status: DC

## 2014-02-27 MED ORDER — NITROGLYCERIN 0.4 MG SL SUBL
0.4000 mg | SUBLINGUAL_TABLET | SUBLINGUAL | Status: DC | PRN
  Filled 2014-02-27: qty 1

## 2014-02-27 MED ORDER — MORPHINE SULFATE 4 MG/ML IJ SOLN
8.0000 mg | Freq: Once | INTRAMUSCULAR | Status: AC
  Administered 2014-02-27: 8 mg via INTRAVENOUS
  Filled 2014-02-27: qty 2

## 2014-02-27 MED ORDER — SODIUM CHLORIDE 0.9 % IV BOLUS (SEPSIS): 500.0000 mL | Freq: Once | INTRAVENOUS | Status: DC

## 2014-02-27 NOTE — ED Notes (Signed)
Lab to get results for BNP drawn at 2000

## 2014-02-27 NOTE — ED Provider Notes (Signed)
Medical screening examination/treatment/procedure(s) were conducted as a shared visit with non-physician practitioner(s) and myself.  I personally evaluated the patient during the encounter.   EKG Interpretation   Date/Time:  Sunday February 27 2014 19:15:26 EDT Ventricular Rate:  76 PR Interval:  176 QRS Duration: 76 QT Interval:  394 QTC Calculation: 443 R Axis:   44 Text Interpretation:  Normal sinus rhythm Cannot rule out Anterior infarct  , age undetermined Abnormal ECG No significant change was found Confirmed  by Murle Otting  MD, Lennette Bihari ([id number]) on 02/27/2014 7:54:26 PM      Patient with some exertional chest pain shortness of breath.  EKG troponin without abnormalities.  Patient be admitted for cardiac rule out.  Hoy Morn, MD 02/27/14 [phone]

## 2014-02-27 NOTE — ED Notes (Addendum)
Pt reports having mid chest pain and sob with exertion, has  Been feeling bad for extended amount of time. Also having severe lower back pain. ekg done at triage. Recently has been referred to cardiologist due to possible chf.

## 2014-02-27 NOTE — ED Notes (Signed)
Patient transported to X-ray 

## 2014-02-27 NOTE — ED Provider Notes (Signed)
CSN: [id number]     Arrival date & time 02/27/14  1908 History   First MD Initiated Contact with Patient 02/27/14 1940     Chief Complaint  Patient presents with  . Chest Pain  . Shortness of Breath    Patient is a 53 y.o. female presenting with chest pain. The history is provided by the patient.  Chest Pain Pain location:  Substernal area Pain quality: pressure   Pain quality comment:  Heaviness Duration:  2 hours Timing:  Constant Progression:  Improving Chronicity:  New Context: raising an arm   Context: not breathing (no pleurisy), not lifting and not at rest   Context comment:  Walking Associated symptoms: back pain (chronic lower back pain, no new changes), lower extremity edema (3 weeks after increasing prednisone by PCP), orthopnea (1 week) and shortness of breath   Associated symptoms: no abdominal pain, no cough, no dysphagia, no fatigue, no fever, no heartburn, no nausea, no near-syncope, no PND (on CPAP), no syncope and not vomiting   Shortness of breath:    Duration:  1 month  Cardiology appt on 10/9.  Not taking ASA  Past Medical History  Diagnosis Date  . Unspecified deficiency anemia   . Hypertension   . Diabetes mellitus   . Hyperlipidemia   . Rheumatoid arthritis(714.0)   . Endometrial polyp   . Fibroadenoma of breast     left  . Hepatic steatosis   . Sleep apnea    Past Surgical History  Procedure Laterality Date  . Tubal ligation      bilateral  . Hand surgery      for arthritis  . Tonsillectomy    . Left knee surgery nodule removal    . Other surgical history      multiple nodule removal on neck, hands, left knee  . Dilation and curettage of uterus     Family History  Problem Relation Age of Onset  . Diabetes Mother   . Cancer Mother     breast  . Diabetes Sister   . Cancer Brother     bone marrow cancer   History  Substance Use Topics  . Smoking status: Current Every Day Smoker  . Smokeless tobacco: Never Used  . Alcohol Use: No    OB History   Grav Para Term Preterm Abortions TAB SAB Ect Mult Living   3 2 2  0 1 0 1 0 0 2     Review of Systems  Constitutional: Negative for fever, chills and fatigue.  HENT: Negative for trouble swallowing.   Respiratory: Positive for shortness of breath. Negative for cough and wheezing.   Cardiovascular: Positive for orthopnea (1 week). Negative for chest pain, syncope, PND (on CPAP) and near-syncope.  Gastrointestinal: Negative for heartburn, nausea, vomiting and abdominal pain.  Musculoskeletal: Positive for back pain (chronic lower back pain, no new changes).  Skin: Negative for rash.  All other systems reviewed and are negative.   Allergies  Bactrim; Cefuroxime axetil; Cephalosporins; Lisinopril; Penicillins; Sulfa antibiotics; and Iohexol  Home Medications   Prior to Admission medications   Medication Sig Start Date End Date Taking? Authorizing Provider  abatacept (ORENCIA) 250 MG injection Inject 1,000 mg into the vein every 28 (twenty-eight) days. Monthly Infusion    Historical Provider, MD  amitriptyline (ELAVIL) 25 MG tablet Take 25 mg by mouth daily.     Historical Provider, MD  baclofen (LIORESAL) 10 MG tablet Take 10 mg by mouth See admin instructions. May take 10  mg twice weekly if needed for headaches    Historical Provider, MD  BD INSULIN SYRINGE ULTRAFINE 31G X 15/64" 1 ML MISC  01/03/14   Historical Provider, MD  chlorthalidone (HYGROTON) 25 MG tablet Take 25 mg by mouth daily.    Historical Provider, MD  diltiazem (CARDIZEM CD) 240 MG 24 hr capsule  02/03/14   Historical Provider, MD  diltiazem (DILACOR XR) 240 MG 24 hr capsule Take 240 mg by mouth daily.    Historical Provider, MD  doxycycline (VIBRAMYCIN) 100 MG capsule  12/14/13   Historical Provider, MD  gabapentin (NEURONTIN) 100 MG capsule Take 100 mg by mouth 3 (three) times daily.    Historical Provider, MD  HYDROcodone-acetaminophen (VICODIN) 5-500 MG per tablet Take 1 tablet by mouth every 6 (six)  hours as needed for pain.    Historical Provider, MD  insulin lispro (HUMALOG) 100 UNIT/ML injection Inject 20 Units into the skin 3 (three) times daily before meals.    Historical Provider, MD  insulin NPH (HUMULIN N,NOVOLIN N) 100 UNIT/ML injection Inject 40-75 Units into the skin 3 (three) times daily with meals. 75 units with breakfast; lunch 75 units; 75 units at dinner    Historical Provider, MD  irbesartan (AVAPRO) 300 MG tablet Take 300 mg by mouth daily.     Historical Provider, MD  Methotrexate Sodium, PF, 50 MG/2ML SOLN Take 0.8 mg by mouth. 11/10/13   Historical Provider, MD  Nebivolol HCl (BYSTOLIC) 20 MG TABS Take 20 mg by mouth daily. 02/13/12   Historical Provider, MD  pantoprazole (PROTONIX) 40 MG tablet Take 40 mg by mouth daily.    Historical Provider, MD  prednisoLONE acetate (PRED FORTE) 1 % ophthalmic suspension  12/27/13   Historical Provider, MD  predniSONE (DELTASONE) 5 MG tablet Take 5 mg by mouth daily.    Historical Provider, MD  rosuvastatin (CRESTOR) 10 MG tablet Take 10 mg by mouth daily.    Historical Provider, MD  tiZANidine (ZANAFLEX) 4 MG tablet  01/12/14   Historical Provider, MD  topiramate (TOPAMAX) 25 MG tablet Take 75 mg by mouth at bedtime. Migraine preventative    Historical Provider, MD  VOLTAREN 1 % GEL  02/03/14   Historical Provider, MD   BP 114/56  Pulse 70  Temp(Src) 98 F (36.7 C) (Oral)  Resp 22  SpO2 98% Physical Exam  Nursing note and vitals reviewed. Constitutional: She is oriented to person, place, and time. She appears well-developed and well-nourished. No distress.  HENT:  Head: Normocephalic and atraumatic.  Nose: Nose normal.  Mouth/Throat: Oropharynx is clear and moist. No oropharyngeal exudate.  Eyes: Conjunctivae are normal. Pupils are equal, round, and reactive to light.  Neck: Normal range of motion. Neck supple. No JVD (unable to appreciate due to body habitus) present. No tracheal deviation present.  Cardiovascular: Normal rate,  regular rhythm, normal heart sounds and intact distal pulses.   No murmur heard. Pulmonary/Chest: Effort normal and breath sounds normal. No respiratory distress. She has no wheezes. She has no rales. She exhibits no tenderness.  Abdominal: Soft. Bowel sounds are normal. She exhibits no distension and no mass. There is no tenderness.  Musculoskeletal: Normal range of motion. She exhibits edema (1+ pitting edema to bilateral lower extremities to mid calves, equal).  No calf tenderness, warmth, erythema or palpable cords Back: Bilateral Paraspinous TTP, neg SLR  Neurological: She is alert and oriented to person, place, and time.  SENSORY:  sensation is intact to light touch in:  superficial  peroneal nerve distribution (over dorsum of foot) deep peroneal nerve distribution (over first dorsal web space) sural nerve distribution (over lateral aspect 5th metatarsal) saphenous nerve distribution (over medial instep)  MOTOR:  + motor EHL (great toe dorsiflexion) + FHL (great toe plantar flexion)  + TA (ankle dorsiflexion)  + GSC (ankle plantar flexion)   Skin: Skin is warm and dry. No rash noted. She is not diaphoretic.  Psychiatric: She has a normal mood and affect.    ED Course  Procedures (including critical care time) Labs Review Labs Reviewed  CBC - Abnormal; Notable for the following:    WBC 12.7 (*)    Hemoglobin 9.4 (*)    HCT 29.2 (*)    MCV 66.1 (*)    MCH 21.3 (*)    RDW 16.6 (*)    All other components within normal limits  BASIC METABOLIC PANEL - Abnormal; Notable for the following:    BUN 29 (*)    Creatinine, Ser 1.29 (*)    GFR calc non Af Amer 47 (*)    GFR calc Af Amer 55 (*)    All other components within normal limits  PRO B NATRIURETIC PEPTIDE - Abnormal; Notable for the following:    Pro B Natriuretic peptide (BNP) 156.1 (*)    All other components within normal limits  I-STAT TROPOININ, ED  Randolm Idol, ED    Imaging Review Dg Chest 2  View  02/27/2014   CLINICAL DATA:  Chest pain.  Short of breath.  EXAM: CHEST  2 VIEW  COMPARISON:  02/15/2014.  FINDINGS: Cardiac silhouette is mildly enlarged. Normal mediastinal and hilar contours. Mild scarring in the right upper lobe, stable. No lung consolidation or edema. No pleural effusion or pneumothorax.  Bony thorax is demineralized but grossly intact.  IMPRESSION: No acute cardiopulmonary disease.   Electronically Signed   By: Lajean Manes M.D.   On: 02/27/2014 21:13     EKG Interpretation   Date/Time:  Sunday February 27 2014 19:15:26 EDT Ventricular Rate:  76 PR Interval:  176 QRS Duration: 76 QT Interval:  394 QTC Calculation: 443 R Axis:   44 Text Interpretation:  Normal sinus rhythm Cannot rule out Anterior infarct  , age undetermined Abnormal ECG No significant change was found Confirmed  by CAMPOS  MD, Lennette Bihari ([id number]) on 02/27/2014 7:54:26 PM      MDM   Final diagnoses:  Chest pain, unspecified chest pain type  AKI (acute kidney injury)  Chronic back pain   PMHS for DM, HNT, beta thal, HLD presents for dyspnea and CP.  Pt presents for exertional chest pain while walking.  Multiple risk factors for ACS.  HEAR score at least 4.  Initial trop neg. Will admit for chest pain rule out.   Doubt dissection due to comfortable appearance, equal pulses.   EKG unchanged compared to prior CP improves at rest and is minimal currently.   Doubt PE, no DVT, VSS, no pleurisy or hemoptysis.  Notes LE edema b/l with orthopnea, no obvious pulm edema on CXR, BNP 150.   AKI on labs.  Pts back pain is chronic and not new.  No neuro deficit.   Admit to hospitalist.     Tammy Sours, MD 02/27/14 [phone]

## 2014-02-27 NOTE — H&P (Addendum)
[facility]                                                                                    Patient Demographics  Jessica Howard, is a 53 y.o. female  CSN: [id number]  MRN: [id number]  DOB - August 12, 1960  Admit Date - 02/27/2014  Outpatient Primary MD for the patient is Merrilee Seashore, MD   With History of -  Past Medical History  Diagnosis Date  . Unspecified deficiency anemia   . Hypertension   . Diabetes mellitus   . Hyperlipidemia   . Rheumatoid arthritis(714.0)   . Endometrial polyp   . Fibroadenoma of breast     left  . Hepatic steatosis   . Sleep apnea       Past Surgical History  Procedure Laterality Date  . Tubal ligation      bilateral  . Hand surgery      for arthritis  . Tonsillectomy    . Left knee surgery nodule removal    . Other surgical history      multiple nodule removal on neck, hands, left knee  . Dilation and curettage of uterus      in for   Chief Complaint  Patient presents with  . Chest Pain  . Shortness of Breath     HPI  Jessica Howard  is a 53 y.o. female, with past medical history significant for hypertension, diabetes mellitus and thalassemia presenting for 2 episodes of chest pain that started today . The first one started that in the morning at around 10:00 and lasted for one hour while she is walking substernal nonradiating associated with shortness of breath and cold sweats. It was relieved upon rest. The second one started this afternoon and lasted for 2 hours also described the same. The patient came to the emergency room where she had an episode while having an x-ray done similar to the previous ones. Patient has a history of smoking in the past but stopped around Christmas time and history of mild leg swelling. No history of nausea vomiting or diarrhea. Patient had a stress test on 7 years ago which was negative. The patient had a similar episode of chest tightness around one week ago when receiving  either an IV because of her thalassemia. The patient also has a history of rheumatoid arthritis and is on chronic prednisone and methotrexate.    Review of Systems    In addition to the HPI above,  No Fever-chills, No Headache, No changes with Vision or hearing, No problems swallowing food or Liquids, No Abdominal pain, No Nausea or Vommitting, Bowel movements are regular, No Blood in stool or Urine, No dysuria, No new skin rashes or bruises, No new joints pains-aches,  No new weakness, tingling, numbness in any extremity, No recent weight gain or loss, No polyuria, polydypsia or polyphagia, No significant Mental Stressors.  A full 10 point Review of Systems was done, except as stated above, all other Review of Systems were negative.   Social History History  Substance Use Topics  . Smoking status: Current Every Day Smoker  . Smokeless tobacco: Never Used  . Alcohol Use: No  Family History Family History  Problem Relation Age of Onset  . Diabetes Mother   . Cancer Mother     breast  . Diabetes Sister   . Cancer Brother     bone marrow cancer     Prior to Admission medications   Medication Sig Start Date End Date Taking? Authorizing Provider  abatacept (ORENCIA) 250 MG injection Inject 1,000 mg into the vein every 28 (twenty-eight) days. Monthly Infusion   Yes Historical Provider, MD  amitriptyline (ELAVIL) 25 MG tablet Take 25 mg by mouth daily.    Yes Historical Provider, MD  BD INSULIN SYRINGE ULTRAFINE 31G X 15/64" 1 ML MISC  01/03/14  Yes Historical Provider, MD  chlorthalidone (HYGROTON) 25 MG tablet Take 25 mg by mouth daily.   Yes Historical Provider, MD  diltiazem (CARDIZEM CD) 240 MG 24 hr capsule Take 240 mg by mouth daily.  02/03/14  Yes Historical Provider, MD  doxycycline (VIBRAMYCIN) 100 MG capsule Take 100 mg by mouth 2 (two) times daily.  12/14/13  Yes Historical Provider, MD  fluticasone (FLONASE) 50 MCG/ACT nasal spray Place 1 spray into both  nostrils daily as needed for allergies or rhinitis.   Yes Historical Provider, MD  gabapentin (NEURONTIN) 300 MG capsule Take 600 mg by mouth 2 (two) times daily.   Yes Historical Provider, MD  insulin lispro (HUMALOG) 100 UNIT/ML injection Inject 40 Units into the skin 3 (three) times daily before meals. Per sliding scale   Yes Historical Provider, MD  insulin NPH (HUMULIN N,NOVOLIN N) 100 UNIT/ML injection Inject 75 Units into the skin 3 (three) times daily with meals. 75 units with breakfast; lunch 75 units; 75 units at dinner   Yes Historical Provider, MD  irbesartan (AVAPRO) 300 MG tablet Take 300 mg by mouth daily.    Yes Historical Provider, MD  Methotrexate Sodium, PF, 50 MG/2ML SOLN Take 0.8 mg by mouth once a week.  11/10/13  Yes Historical Provider, MD  Nebivolol HCl (BYSTOLIC) 20 MG TABS Take 20 mg by mouth daily. 02/13/12  Yes Historical Provider, MD  oxyCODONE-acetaminophen (PERCOCET/ROXICET) 5-325 MG per tablet Take 1-2 tablets by mouth every 4 (four) hours as needed for severe pain.   Yes Historical Provider, MD  pantoprazole (PROTONIX) 40 MG tablet Take 40 mg by mouth daily.   Yes Historical Provider, MD  prednisoLONE acetate (PRED FORTE) 1 % ophthalmic suspension  12/27/13  Yes Historical Provider, MD  predniSONE (DELTASONE) 5 MG tablet Take 5 mg by mouth daily.   Yes Historical Provider, MD  rosuvastatin (CRESTOR) 10 MG tablet Take 10 mg by mouth daily.   Yes Historical Provider, MD  tiZANidine (ZANAFLEX) 4 MG tablet Take 4 mg by mouth 2 (two) times daily as needed (limit 2 days per week).  01/12/14  Yes Historical Provider, MD  topiramate (TOPAMAX) 25 MG tablet Take 25 mg by mouth at bedtime. Migraine preventative   Yes Historical Provider, MD  VOLTAREN 1 % GEL Apply 1 application topically 3 (three) times daily.  02/03/14  Yes Historical Provider, MD  baclofen (LIORESAL) 10 MG tablet Take 10 mg by mouth See admin instructions. May take 10 mg twice weekly if needed for headaches     Historical Provider, MD    Allergies  Allergen Reactions  . Bactrim [Sulfamethoxazole-Tmp Ds] Hives  . Cefuroxime Axetil Itching  . Cephalosporins Itching  . Lisinopril Cough  . Penicillins Hives  . Sulfa Antibiotics Hives  . Iohexol Itching and Rash     Code:  RASH, Desc: HAD ITCHING AND A RASH ABOUT ONE HOUR AFTER RETURNING HOME FROM THE CT, Onset Date: [id number]     Physical Exam  Vitals  Blood pressure 123/62, pulse 73, temperature 98 F (36.7 C), temperature source Oral, resp. rate 22, last menstrual period 12/27/2013, SpO2 100.00%.   1. General a very pleasant African American female, obese  2. Normal affect and insight, Not Suicidal or Homicidal, Awake Alert, Oriented X 3.  3. No F.N deficits, ALL C.Nerves Intact,  4. Ears and Eyes appear Normal, Conjunctivae clear, PERRLA. Moist Oral Mucosa.  5. Supple Neck, No JVD, No cervical lymphadenopathy appriciated, No Carotid Bruits.  6. Symmetrical Chest wall movement, Good air movement bilaterally, CTAB.  7. RRR, No Gallops, Rubs or Murmurs, No Parasternal Heave.  8. Positive Bowel Sounds, Abdomen Soft, Non tender, No organomegaly appriciated,No rebound -guarding or rigidity.  9.  No Cyanosis, Normal Skin Turgor, No Skin Rash or Bruise.  10. Good muscle tone,  trace edema bilateral lower extremities  11. No Palpable Lymph Nodes in Neck or Axillae    Data Review  CBC  Recent Labs Lab 02/27/14 2001  WBC 12.7*  HGB 9.4*  HCT 29.2*  PLT 334  MCV 66.1*  MCH 21.3*  MCHC 32.2  RDW 16.6*   ------------------------------------------------------------------------------------------------------------------  Chemistries   Recent Labs Lab 02/27/14 2001  NA 139  K 4.7  CL 102  CO2 23  GLUCOSE 87  BUN 29*  CREATININE 1.29*  CALCIUM 8.9   ------------------------------------------------------------------------------------------------------------------ CrCl is unknown because both a height and weight  (above a minimum accepted value) are required for this calculation. ------------------------------------------------------------------------------------------------------------------ No results found for this basename: TSH, T4TOTAL, FREET3, T3FREE, THYROIDAB,  in the last 72 hours   Coagulation profile No results found for this basename: INR, PROTIME,  in the last 168 hours ------------------------------------------------------------------------------------------------------------------- No results found for this basename: DDIMER,  in the last 72 hours -------------------------------------------------------------------------------------------------------------------  Cardiac Enzymes No results found for this basename: CK, CKMB, TROPONINI, MYOGLOBIN,  in the last 168 hours ------------------------------------------------------------------------------------------------------------------ No components found with this basename: POCBNP,    ---------------------------------------------------------------------------------------------------------------  Urinalysis No results found for this basename: colorurine, appearanceur, labspec, phurine, glucoseu, hgbur, bilirubinur, ketonesur, proteinur, urobilinogen, nitrite, leukocytesur    ----------------------------------------------------------------------------------------------------------------  Imaging results:   Dg Chest 2 View  02/27/2014   CLINICAL DATA:  Chest pain.  Short of breath.  EXAM: CHEST  2 VIEW  COMPARISON:  02/15/2014.  FINDINGS: Cardiac silhouette is mildly enlarged. Normal mediastinal and hilar contours. Mild scarring in the right upper lobe, stable. No lung consolidation or edema. No pleural effusion or pneumothorax.  Bony thorax is demineralized but grossly intact.  IMPRESSION: No acute cardiopulmonary disease.   Electronically Signed   By: Lajean Manes M.D.   On: 02/27/2014 21:13    My personal review of EKG: Normal sinus  rhythm at 76 beats per minute     Assessment & Plan  1. chest pain, recurrent, worsened by exercise and relieved by rest    EKG shows no acute changes    Aspirin 81 mg daily    Serial troponins    Consider stress test in a.m. 2. History of hypertension    Controlled now with an episode of hypotension earlier    Monitor 3. Diabetes mellitus     Continue with NPH and insulin sliding scale 4. Hyper-cholesterolemia     Continue with statins 5. Rheumatoid arthritis     Continue with by mouth steroids and methotrexate 6. Obesity 7. mild  chronic renal insufficiency     Monitor   DVT Prophylaxis Lovenox  AM Labs Ordered, also please review Full Orders  Family Communication: Admission, patients condition and plan of care including tests being ordered have been discussed with the patient and sister who indicate understanding and agree with the plan and Code Status.  Code Status full  Disposition Plan: Home  Time spent in minutes : 33 minutes  Condition GUARDED   @SIGNATURE @

## 2014-02-28 ENCOUNTER — Encounter: Payer: Self-pay | Admitting: Nurse Practitioner

## 2014-02-28 DIAGNOSIS — R609 Edema, unspecified: Secondary | ICD-10-CM | POA: Diagnosis present

## 2014-02-28 DIAGNOSIS — G4733 Obstructive sleep apnea (adult) (pediatric): Secondary | ICD-10-CM | POA: Diagnosis present

## 2014-02-28 DIAGNOSIS — Z7952 Long term (current) use of systemic steroids: Secondary | ICD-10-CM

## 2014-02-28 DIAGNOSIS — D569 Thalassemia, unspecified: Secondary | ICD-10-CM | POA: Diagnosis present

## 2014-02-28 DIAGNOSIS — G8929 Other chronic pain: Secondary | ICD-10-CM | POA: Diagnosis not present

## 2014-02-28 DIAGNOSIS — I5033 Acute on chronic diastolic (congestive) heart failure: Secondary | ICD-10-CM | POA: Diagnosis present

## 2014-02-28 DIAGNOSIS — R079 Chest pain, unspecified: Secondary | ICD-10-CM | POA: Diagnosis not present

## 2014-02-28 DIAGNOSIS — R0602 Shortness of breath: Secondary | ICD-10-CM | POA: Diagnosis present

## 2014-02-28 DIAGNOSIS — E785 Hyperlipidemia, unspecified: Secondary | ICD-10-CM | POA: Diagnosis present

## 2014-02-28 DIAGNOSIS — M069 Rheumatoid arthritis, unspecified: Secondary | ICD-10-CM | POA: Diagnosis present

## 2014-02-28 DIAGNOSIS — M549 Dorsalgia, unspecified: Secondary | ICD-10-CM | POA: Diagnosis present

## 2014-02-28 DIAGNOSIS — I369 Nonrheumatic tricuspid valve disorder, unspecified: Secondary | ICD-10-CM | POA: Diagnosis not present

## 2014-02-28 DIAGNOSIS — I10 Essential (primary) hypertension: Secondary | ICD-10-CM | POA: Diagnosis present

## 2014-02-28 DIAGNOSIS — N179 Acute kidney failure, unspecified: Secondary | ICD-10-CM | POA: Diagnosis not present

## 2014-02-28 DIAGNOSIS — E119 Type 2 diabetes mellitus without complications: Secondary | ICD-10-CM | POA: Diagnosis present

## 2014-02-28 DIAGNOSIS — N289 Disorder of kidney and ureter, unspecified: Secondary | ICD-10-CM | POA: Diagnosis present

## 2014-02-28 LAB — COMPREHENSIVE METABOLIC PANEL
ALT: 15 U/L (ref 0–35)
AST: 23 U/L (ref 0–37)
Albumin: 3.2 g/dL — ABNORMAL LOW (ref 3.5–5.2)
Alkaline Phosphatase: 67 U/L (ref 39–117)
Anion gap: 16 — ABNORMAL HIGH (ref 5–15)
BUN: 30 mg/dL — AB (ref 6–23)
CALCIUM: 8.5 mg/dL (ref 8.4–10.5)
CO2: 22 mEq/L (ref 19–32)
Chloride: 101 mEq/L (ref 96–112)
Creatinine, Ser: 1.09 mg/dL (ref 0.50–1.10)
GFR calc non Af Amer: 58 mL/min — ABNORMAL LOW (ref >90)
GFR, EST AFRICAN AMERICAN: 67 mL/min — AB (ref >90)
Glucose, Bld: 51 mg/dL — ABNORMAL LOW (ref 70–99)
Potassium: 4.2 mEq/L (ref 3.7–5.3)
SODIUM: 139 meq/L (ref 137–147)
TOTAL PROTEIN: 6.7 g/dL (ref 6.0–8.3)
Total Bilirubin: 0.3 mg/dL (ref 0.3–1.2)

## 2014-02-28 LAB — URINALYSIS, ROUTINE W REFLEX MICROSCOPIC
Bilirubin Urine: NEGATIVE
Glucose, UA: NEGATIVE mg/dL
Hgb urine dipstick: NEGATIVE
KETONES UR: NEGATIVE mg/dL
Leukocytes, UA: NEGATIVE
NITRITE: NEGATIVE
PROTEIN: NEGATIVE mg/dL
Specific Gravity, Urine: 1.016 (ref 1.005–1.030)
Urobilinogen, UA: 0.2 mg/dL (ref 0.0–1.0)
pH: 5 (ref 5.0–8.0)

## 2014-02-28 LAB — GLUCOSE, CAPILLARY
GLUCOSE-CAPILLARY: 179 mg/dL — AB (ref 70–99)
Glucose-Capillary: 116 mg/dL — ABNORMAL HIGH (ref 70–99)
Glucose-Capillary: 60 mg/dL — ABNORMAL LOW (ref 70–99)
Glucose-Capillary: 73 mg/dL (ref 70–99)
Glucose-Capillary: 75 mg/dL (ref 70–99)
Glucose-Capillary: 84 mg/dL (ref 70–99)

## 2014-02-28 LAB — BASIC METABOLIC PANEL
ANION GAP: 12 (ref 5–15)
BUN: 29 mg/dL — ABNORMAL HIGH (ref 6–23)
CHLORIDE: 101 meq/L (ref 96–112)
CO2: 24 mEq/L (ref 19–32)
Calcium: 8.9 mg/dL (ref 8.4–10.5)
Creatinine, Ser: 1.17 mg/dL — ABNORMAL HIGH (ref 0.50–1.10)
GFR calc Af Amer: 61 mL/min — ABNORMAL LOW (ref >90)
GFR calc non Af Amer: 53 mL/min — ABNORMAL LOW (ref >90)
Glucose, Bld: 75 mg/dL (ref 70–99)
Potassium: 4.1 mEq/L (ref 3.7–5.3)
Sodium: 137 mEq/L (ref 137–147)

## 2014-02-28 LAB — TROPONIN I
Troponin I: <0.3 ng/mL (ref <0.30)
Troponin I: <0.3 ng/mL (ref <0.30)

## 2014-02-28 LAB — MRSA PCR SCREENING: MRSA by PCR: NEGATIVE

## 2014-02-28 MED ORDER — SODIUM CHLORIDE 0.9 % IV SOLN: 250.0000 mL | INTRAVENOUS | Status: DC | PRN

## 2014-02-28 MED ORDER — INFLUENZA VAC SPLIT QUAD 0.5 ML IM SUSY
0.5000 mL | PREFILLED_SYRINGE | INTRAMUSCULAR | Status: DC
  Filled 2014-02-28: qty 0.5

## 2014-02-28 MED ORDER — SODIUM CHLORIDE 0.9 % IJ SOLN
3.0000 mL | Freq: Two times a day (BID) | INTRAMUSCULAR | Status: DC
  Administered 2014-02-28 (×2): 3 mL via INTRAVENOUS

## 2014-02-28 MED ORDER — INSULIN ASPART 100 UNIT/ML ~~LOC~~ SOLN: 0.0000 [IU] | Freq: Every day | SUBCUTANEOUS | Status: DC

## 2014-02-28 MED ORDER — CHLORTHALIDONE 25 MG PO TABS
25.0000 mg | ORAL_TABLET | Freq: Every day | ORAL | Status: DC
  Filled 2014-02-28: qty 1

## 2014-02-28 MED ORDER — FUROSEMIDE 10 MG/ML IJ SOLN
40.0000 mg | Freq: Once | INTRAMUSCULAR | Status: AC
  Administered 2014-02-28: 40 mg via INTRAVENOUS
  Filled 2014-02-28: qty 4

## 2014-02-28 MED ORDER — TOPIRAMATE 25 MG PO TABS
25.0000 mg | ORAL_TABLET | Freq: Every day | ORAL | Status: DC
  Administered 2014-02-28 (×2): 25 mg via ORAL
  Filled 2014-02-28 (×4): qty 1

## 2014-02-28 MED ORDER — ALBUTEROL SULFATE (2.5 MG/3ML) 0.083% IN NEBU
2.5000 mg | INHALATION_SOLUTION | Freq: Four times a day (QID) | RESPIRATORY_TRACT | Status: DC
  Administered 2014-02-28: 2.5 mg via RESPIRATORY_TRACT
  Filled 2014-02-28: qty 3

## 2014-02-28 MED ORDER — ALBUTEROL SULFATE (2.5 MG/3ML) 0.083% IN NEBU
2.5000 mg | INHALATION_SOLUTION | Freq: Three times a day (TID) | RESPIRATORY_TRACT | Status: DC
  Administered 2014-02-28: 2.5 mg via RESPIRATORY_TRACT
  Filled 2014-02-28: qty 3

## 2014-02-28 MED ORDER — PERFLUTREN LIPID MICROSPHERE
1.0000 mL | INTRAVENOUS | Status: AC | PRN
  Filled 2014-02-28: qty 10

## 2014-02-28 MED ORDER — IPRATROPIUM BROMIDE 0.02 % IN SOLN
0.5000 mg | Freq: Four times a day (QID) | RESPIRATORY_TRACT | Status: DC
  Administered 2014-02-28 (×2): 0.5 mg via RESPIRATORY_TRACT
  Filled 2014-02-28 (×2): qty 2.5

## 2014-02-28 MED ORDER — TIZANIDINE HCL 4 MG PO TABS
4.0000 mg | ORAL_TABLET | Freq: Two times a day (BID) | ORAL | Status: DC | PRN
  Filled 2014-02-28: qty 1

## 2014-02-28 MED ORDER — OXYCODONE HCL 5 MG PO TABS
5.0000 mg | ORAL_TABLET | ORAL | Status: DC | PRN
  Administered 2014-02-28 (×3): 5 mg via ORAL
  Filled 2014-02-28 (×3): qty 1

## 2014-02-28 MED ORDER — INSULIN ASPART 100 UNIT/ML ~~LOC~~ SOLN
0.0000 [IU] | Freq: Three times a day (TID) | SUBCUTANEOUS | Status: DC
  Administered 2014-03-01 (×2): 5 [IU] via SUBCUTANEOUS

## 2014-02-28 MED ORDER — ACETAMINOPHEN 325 MG PO TABS
650.0000 mg | ORAL_TABLET | Freq: Four times a day (QID) | ORAL | Status: DC | PRN
  Administered 2014-02-28: 650 mg via ORAL
  Filled 2014-02-28: qty 2

## 2014-02-28 MED ORDER — METHOTREXATE SODIUM CHEMO INJECTION 25 MG/ML
20.0000 mg | INTRAMUSCULAR | Status: DC
  Administered 2014-02-28: 20 mg via SUBCUTANEOUS
  Filled 2014-02-28: qty 0.8

## 2014-02-28 MED ORDER — SODIUM CHLORIDE 0.9 % IJ SOLN: 3.0000 mL | INTRAMUSCULAR | Status: DC | PRN

## 2014-02-28 MED ORDER — ZOLPIDEM TARTRATE 5 MG PO TABS: 5.0000 mg | ORAL_TABLET | Freq: Every evening | ORAL | Status: DC | PRN

## 2014-02-28 MED ORDER — DILTIAZEM HCL ER COATED BEADS 240 MG PO CP24
240.0000 mg | ORAL_CAPSULE | Freq: Every day | ORAL | Status: DC
  Administered 2014-02-28 – 2014-03-01 (×2): 240 mg via ORAL
  Filled 2014-02-28 (×2): qty 1

## 2014-02-28 MED ORDER — ACETAMINOPHEN 650 MG RE SUPP: 650.0000 mg | Freq: Four times a day (QID) | RECTAL | Status: DC | PRN

## 2014-02-28 MED ORDER — IRBESARTAN 300 MG PO TABS
300.0000 mg | ORAL_TABLET | Freq: Every day | ORAL | Status: DC
  Administered 2014-02-28 – 2014-03-01 (×2): 300 mg via ORAL
  Filled 2014-02-28 (×2): qty 1

## 2014-02-28 MED ORDER — FUROSEMIDE 10 MG/ML IJ SOLN: 40.0000 mg | Freq: Two times a day (BID) | INTRAMUSCULAR | Status: DC

## 2014-02-28 MED ORDER — IPRATROPIUM-ALBUTEROL 0.5-2.5 (3) MG/3ML IN SOLN
3.0000 mL | Freq: Three times a day (TID) | RESPIRATORY_TRACT | Status: DC
  Administered 2014-02-28 – 2014-03-01 (×4): 3 mL via RESPIRATORY_TRACT
  Filled 2014-02-28 (×4): qty 3

## 2014-02-28 MED ORDER — ATORVASTATIN CALCIUM 10 MG PO TABS
10.0000 mg | ORAL_TABLET | Freq: Every day | ORAL | Status: DC
  Administered 2014-02-28: 10 mg via ORAL
  Filled 2014-02-28 (×2): qty 1

## 2014-02-28 MED ORDER — ONDANSETRON HCL 4 MG PO TABS: 4.0000 mg | ORAL_TABLET | Freq: Four times a day (QID) | ORAL | Status: DC | PRN

## 2014-02-28 MED ORDER — PANTOPRAZOLE SODIUM 40 MG PO TBEC
40.0000 mg | DELAYED_RELEASE_TABLET | Freq: Every day | ORAL | Status: DC
  Administered 2014-02-28 – 2014-03-01 (×2): 40 mg via ORAL
  Filled 2014-02-28 (×2): qty 1

## 2014-02-28 MED ORDER — FLUTICASONE PROPIONATE 50 MCG/ACT NA SUSP: 1.0000 | Freq: Every day | NASAL | Status: DC | PRN

## 2014-02-28 MED ORDER — SODIUM CHLORIDE 0.9 % IJ SOLN
3.0000 mL | Freq: Two times a day (BID) | INTRAMUSCULAR | Status: DC
  Administered 2014-02-28 – 2014-03-01 (×2): 3 mL via INTRAVENOUS

## 2014-02-28 MED ORDER — AMITRIPTYLINE HCL 25 MG PO TABS
25.0000 mg | ORAL_TABLET | Freq: Every day | ORAL | Status: DC
  Administered 2014-02-28 (×2): 25 mg via ORAL
  Filled 2014-02-28 (×4): qty 1

## 2014-02-28 MED ORDER — ENOXAPARIN SODIUM 80 MG/0.8ML ~~LOC~~ SOLN
65.0000 mg | SUBCUTANEOUS | Status: DC
  Administered 2014-02-28: 65 mg via SUBCUTANEOUS
  Filled 2014-02-28 (×2): qty 0.8

## 2014-02-28 MED ORDER — ASPIRIN EC 81 MG PO TBEC
81.0000 mg | DELAYED_RELEASE_TABLET | Freq: Every day | ORAL | Status: DC
  Administered 2014-02-28 – 2014-03-01 (×2): 81 mg via ORAL
  Filled 2014-02-28 (×2): qty 1

## 2014-02-28 MED ORDER — NEBIVOLOL HCL 10 MG PO TABS
20.0000 mg | ORAL_TABLET | Freq: Every day | ORAL | Status: DC
  Administered 2014-02-28 – 2014-03-01 (×2): 20 mg via ORAL
  Filled 2014-02-28 (×2): qty 2

## 2014-02-28 MED ORDER — PREDNISONE 5 MG PO TABS
5.0000 mg | ORAL_TABLET | Freq: Every day | ORAL | Status: DC
  Administered 2014-02-28 – 2014-03-01 (×2): 5 mg via ORAL
  Filled 2014-02-28 (×3): qty 1

## 2014-02-28 MED ORDER — ONDANSETRON HCL 4 MG/2ML IJ SOLN: 4.0000 mg | Freq: Four times a day (QID) | INTRAMUSCULAR | Status: DC | PRN

## 2014-02-28 MED ORDER — GABAPENTIN 600 MG PO TABS
600.0000 mg | ORAL_TABLET | Freq: Two times a day (BID) | ORAL | Status: DC
  Administered 2014-02-28 – 2014-03-01 (×4): 600 mg via ORAL
  Filled 2014-02-28 (×6): qty 1

## 2014-02-28 MED ORDER — ENOXAPARIN SODIUM 40 MG/0.4ML ~~LOC~~ SOLN
40.0000 mg | Freq: Every day | SUBCUTANEOUS | Status: DC
  Filled 2014-02-28: qty 0.4

## 2014-02-28 MED ORDER — INSULIN NPH (HUMAN) (ISOPHANE) 100 UNIT/ML ~~LOC~~ SUSP
75.0000 [IU] | Freq: Three times a day (TID) | SUBCUTANEOUS | Status: DC
  Administered 2014-03-01: 75 [IU] via SUBCUTANEOUS
  Filled 2014-02-28: qty 10

## 2014-02-28 MED ORDER — ABATACEPT 250 MG IV SOLR
1000.0000 mg | INTRAVENOUS | Status: DC
Start: 2014-02-28 — End: 2014-02-28

## 2014-02-28 NOTE — Progress Notes (Signed)
RT placed patient on CPAP home settings of 5.5cmH20, 21%.  Patient tolerating well.

## 2014-02-28 NOTE — Progress Notes (Signed)
[facility] PROGRESS NOTE  Jessica Howard MVE:[id number] DOB: December 30, 1960 DOA: 02/27/2014 PCP: Merrilee Seashore, MD  Assessment/Plan:  Acute on chronic diastolic CHF, Class II -Presented with chest pain,  increased shortness of breath, and BLE edema, lungs without rales -EKG- no edema -2D echo- EF 55%, increased wall thickness and with moderate LVH. -will start IV lasix 40 mg BID  -Limit fluids, strict I/O, and daily weight  -monitor BMET -continue ASA, statin, CCB, BB  Chest Pain -States chest pain has not recurred. Continued SOB.  -Appreciate Cardiology following- given one dose of IV lasix, and ordered 2D echo to assess response. Questionable if stress test will be conclusive and might consider cardiac catheterization.  Leukocytosis -WBC 12 -CXR, UA unremarkable  Back pain Complains of back pain -PRN pain management  Essential hypertension  -BP stable -continue home regimen of BB, CCB  Diabetes -CBGs stable -continue Humulin 75 units and SSI  Thalassemia -Followed by oncology- last iron infusion about two weeks ago and couldn't be completed due to complaints of shortness of breath  Iron deficiency anemia, unspecified -Hgb at 9.4 -continue to monitor CBC  Dyslipidemia -continue Lipitor  On prednisone therapy -for treatmetn of RA-recently increased   Rheumatoid Arthritis -prednisone recently increased as outpatient -continue home medication of methotrexate and prednisone  Renal insufficiency -resolved-creatinine WNL  -continue to follow BMET  Depression -continue amitriptyline   Migraine -continue Topamax  Diabetic neuropathy -continue gabapentin   GERD -continue pantoprazole  Obstructive sleep apnea -CPAP PRN  Morbid obesity -BMI 53.5   DVT Prophylaxis Lovenox [place] Code Status: Full Family Communication: son at bedside Disposition Plan: Inpatient   Consultants:  Cardiology  Procedures:  2D ech-  pending  Antibiotics:  None  HPI/Subjective: Jessica Howard is a 53 y.o.female with PMH of thalassemia, Hypertension, Diabetes, Dyslipidemia, Rheumatoid arthritis, and iron deficiency anemia, admitted on 02/27/14, who presented with worsening shortness of breath present for the past few weeks. States that last week she was getting an iron infusion for thalassemia and felt increased shortness of breath and some chest heaviness.  Since then, she has had further problems with low back pain and shortness of breath. She presented to ED after chest pain increased in severity. Recently, thiazide diuretic dose was increased as an outpatient. EKG so far have shown no diagnostic changes. Troponins so far are normal.  Historically the patient had a stress test greater than 5 years ago. By history this may have been a pharmacologic nuclear study. I do not have the specific data.   Complains of SOB and continued low back pain.  Objective: Filed Vitals:   02/28/14 0834  BP:   Pulse: 69  Temp:   Resp: 24    Intake/Output Summary (Last 24 hours) at 02/28/14 1219 Last data filed at 02/28/14 0900  Gross per 24 hour  Intake      0 ml  Output      0 ml  Net      0 ml   Filed Weights   02/28/14 0003  Weight: 136.669 kg (301 lb 4.8 oz)    Exam:  Gen: Alert and oriented obese AA female in NAD.  HEENT: Normocephalic, atraumatic.  Pupils symmertrical.  Moist mucosa.   Chest: clear to auscultate bilaterally, no ronchi or rales  Cardiac: Regular rate and rhythm, S1-S2, no rubs murmurs or gallops.  Abdomen: soft, non tender, non distended, +bowel sounds. No guarding or rigidity  Extremities: Symmetrical in appearance without cyanosis. 1+ BLE edema  Neurological: Alert awake  oriented to time place and person.  Psychiatric: Appears normal.    Data Reviewed: Basic Metabolic Panel:  Recent Labs Lab 02/27/14 2001 02/28/14 0056  NA 139 137  K 4.7 4.1  CL 102 101  CO2 23 24  GLUCOSE 87 75  BUN 29*  29*  CREATININE 1.29* 1.17*  CALCIUM 8.9 8.9   Liver Function Tests: No results found for this basename: AST, ALT, ALKPHOS, BILITOT, PROT, ALBUMIN,  in the last 168 hours No results found for this basename: LIPASE, AMYLASE,  in the last 168 hours No results found for this basename: AMMONIA,  in the last 168 hours CBC:  Recent Labs Lab 02/27/14 2001  WBC 12.7*  HGB 9.4*  HCT 29.2*  MCV 66.1*  PLT 334   Cardiac Enzymes:  Recent Labs Lab 02/28/14 0056 02/28/14 1102  TROPONINI <0.30 <0.30   BNP (last 3 results)  Recent Labs  02/27/14 2001  PROBNP 156.1*   CBG:  Recent Labs Lab 02/28/14 0002 02/28/14 0807 02/28/14 1133  GLUCAP 73 84 60*    Recent Results (from the past 240 hour(s))  MRSA PCR SCREENING     Status: None   Collection Time    02/28/14 12:35 AM      Result Value Ref Range Status   MRSA by PCR NEGATIVE  NEGATIVE Final   Comment:            The GeneXpert MRSA Assay (FDA     approved for NASAL specimens     only), is one component of a     comprehensive MRSA colonization     surveillance program. It is not     intended to diagnose MRSA     infection nor to guide or     monitor treatment for     MRSA infections.     Studies: Dg Chest 2 View  02/27/2014   CLINICAL DATA:  Chest pain.  Short of breath.  EXAM: CHEST  2 VIEW  COMPARISON:  02/15/2014.  FINDINGS: Cardiac silhouette is mildly enlarged. Normal mediastinal and hilar contours. Mild scarring in the right upper lobe, stable. No lung consolidation or edema. No pleural effusion or pneumothorax.  Bony thorax is demineralized but grossly intact.  IMPRESSION: No acute cardiopulmonary disease.   Electronically Signed   By: Lajean Manes M.D.   On: 02/27/2014 21:13    Scheduled Meds: . amitriptyline  25 mg Oral QHS  . aspirin EC  81 mg Oral Daily  . atorvastatin  10 mg Oral q1800  . chlorthalidone  25 mg Oral Daily  . diltiazem  240 mg Oral Daily  . enoxaparin (LOVENOX) injection  65 mg  Subcutaneous Q24H  . furosemide  40 mg Intravenous Once  . gabapentin  600 mg Oral BID  . [START ON 03/01/2014] Influenza vac split quadrivalent PF  0.5 mL Intramuscular Tomorrow-1000  . insulin aspart  0-15 Units Subcutaneous TID WC  . insulin aspart  0-5 Units Subcutaneous QHS  . insulin NPH Human  75 Units Subcutaneous TID WC  . ipratropium-albuterol  3 mL Nebulization TID  . irbesartan  300 mg Oral Daily  . methotrexate  20 mg Subcutaneous Q Sun  . nebivolol  20 mg Oral Daily  . pantoprazole  40 mg Oral Daily  . predniSONE  5 mg Oral Q breakfast  . sodium chloride  500 mL Intravenous Once  . sodium chloride  3 mL Intravenous Q12H  . sodium chloride  3 mL Intravenous Q12H  .  topiramate  25 mg Oral QHS   Continuous Infusions:   Active Problems:   Iron deficiency anemia, unspecified   Chest pain   Thalassemia   On prednisone therapy   Rheumatoid arthritis   Morbid obesity   Shortness of breath   Renal insufficiency   Edema   Back pain   Essential hypertension   Diabetes   Dyslipidemia   Obstructive sleep apnea    Time spent: [place], [facility]  [facility] Pager [phone]. If 7PM-7AM, please contact night-coverage at [email or web address], password [facility] 02/28/2014, 12:19 PM  LOS: 1 day

## 2014-02-28 NOTE — Assessment & Plan Note (Signed)
Patient had completed all but the last 10 ML's of Feraheme iron infusion today; and experienced very mild, vague chest pain/chest pressure.  No shortness of breath or other symptoms whatsoever. Iron infusion was held at that time; and EKG obtained.  EKG revealed a normal sinus rhythm with a rate of 65; and a QTC of 451.  Patient refused O2; and also refused to be transported to the emergency department for further evaluation.  Patient was not given the last 10 MLs of her iron infusion today.  Patient was observed in the [place] infusion area for greater than one hour; with chest pain completely resolving.  Patient was encouraged to call or go directly to the emergency department if she once again develops any chest pain, chest pressure, or shortness of breath.  Patient stated full understanding of all instructions.

## 2014-02-28 NOTE — Assessment & Plan Note (Addendum)
Patient had completed all but the last 10 ML's of Feraheme iron infusion today; and experienced very mild, vague chest pain/chest pressure.  Iron infusion was held at that time; and EKG obtained.  EKG revealed a normal sinus rhythm with a rate of 65; and a QTC of 451.  Patient has plans to return to the [place] on 04/20/2014 for labs and follow up.

## 2014-02-28 NOTE — Progress Notes (Signed)
[place]   Chief Complaint  Patient presents with  . Chest Pain    HPI: Jessica Howard 53 y.o. female diagnosed with deficiency anemia.  Patient presented to the [facility] today for her Feraheme iron infusion.  She has received the iron infusions in the past with no difficulty whatsoever.  She developed some mild, vague chest pressure/pain with all but the last 10 ML's of the iron infusion at infusion.  Patient denied any shortness of breath whatsoever.  Patient also denied any other symptoms.  Iron infusion was held/discontinued at that time; EKG was obtained.  EKG revealed a normal sinus rhythm with a rate of 65.  Patient refused O2; also refused to be transported to the emergency department for further evaluation.  Patient was monitored in the infusion area for proximally one hour following this episode.  Patient states that the chest pain/pressure the resolved prior to discharge.   HPI  ROS  Past Medical History  Diagnosis Date  . Unspecified deficiency anemia   . Hypertension   . Diabetes mellitus   . Hyperlipidemia   . Rheumatoid arthritis(714.0)   . Endometrial polyp   . Fibroadenoma of breast     left  . Hepatic steatosis   . Sleep apnea     Past Surgical History  Procedure Laterality Date  . Tubal ligation      bilateral  . Hand surgery      for arthritis  . Tonsillectomy    . Left knee surgery nodule removal    . Other surgical history      multiple nodule removal on neck, hands, left knee  . Dilation and curettage of uterus      has Post-menopausal bleeding; Iron deficiency anemia, unspecified; Chest pain; Thalassemia; On prednisone therapy; Rheumatoid arthritis; Morbid obesity; Shortness of breath; Renal insufficiency; Edema; Back pain; Essential hypertension; Diabetes; Dyslipidemia; and Obstructive sleep apnea on her problem list.     is allergic to bactrim; cefuroxime axetil; cephalosporins; lisinopril; penicillins; sulfa  antibiotics; and iohexol.    Medication List       This list is accurate as of: 02/18/14 11:59 PM.  Always use your most recent med list.               amitriptyline 25 MG tablet  Commonly known as:  ELAVIL  Take 25 mg by mouth daily.     baclofen 10 MG tablet  Commonly known as:  LIORESAL  Take 10 mg by mouth See admin instructions. May take 10 mg twice weekly if needed for headaches     BD INSULIN SYRINGE ULTRAFINE 31G X 15/64" 1 ML Misc  Generic drug:  Insulin Syringe-Needle W-580     BYSTOLIC 20 MG Tabs  Generic drug:  Nebivolol HCl  Take 20 mg by mouth daily.     chlorthalidone 25 MG tablet  Commonly known as:  HYGROTON  Take 25 mg by mouth daily.     diltiazem 240 MG 24 hr capsule  Commonly known as:  CARDIZEM CD  Take 240 mg by mouth daily.     diltiazem 240 MG 24 hr capsule  Commonly known as:  DILACOR XR  Take 240 mg by mouth daily.     doxycycline 100 MG capsule  Commonly known as:  VIBRAMYCIN  Take 100 mg by mouth 2 (two) times daily.     gabapentin 100 MG capsule  Commonly known as:  NEURONTIN  Take 100 mg by mouth 3 (three)  times daily.     HYDROcodone-acetaminophen 5-500 MG per tablet  Commonly known as:  VICODIN  Take 1 tablet by mouth every 6 (six) hours as needed for pain.     insulin lispro 100 UNIT/ML injection  Commonly known as:  HUMALOG  Inject 40 Units into the skin 3 (three) times daily before meals. Per sliding scale     insulin NPH Human 100 UNIT/ML injection  Commonly known as:  HUMULIN N,NOVOLIN N  Inject 75 Units into the skin 3 (three) times daily with meals. 75 units with breakfast; lunch 75 units; 75 units at dinner     irbesartan 300 MG tablet  Commonly known as:  AVAPRO  Take 300 mg by mouth daily.     Methotrexate Sodium (PF) 50 MG/2ML Soln  Take 0.8 mg by mouth once a week.     ORENCIA 250 MG injection  Generic drug:  abatacept  Inject 1,000 mg into the vein every 28 (twenty-eight) days. Monthly Infusion      pantoprazole 40 MG tablet  Commonly known as:  PROTONIX  Take 40 mg by mouth daily.     prednisoLONE acetate 1 % ophthalmic suspension  Commonly known as:  PRED FORTE     predniSONE 5 MG tablet  Commonly known as:  DELTASONE  Take 5 mg by mouth daily.     rosuvastatin 10 MG tablet  Commonly known as:  CRESTOR  Take 10 mg by mouth daily.     tiZANidine 4 MG tablet  Commonly known as:  ZANAFLEX  Take 4 mg by mouth 2 (two) times daily as needed (limit 2 days per week).     topiramate 25 MG tablet  Commonly known as:  TOPAMAX  Take 25 mg by mouth at bedtime. Migraine preventative     VOLTAREN 1 % Gel  Generic drug:  diclofenac sodium  Apply 1 application topically 3 (three) times daily.         PHYSICAL EXAMINATION  Vitals: BP 135/71, HR 68, temp 98.7, RR 18, sat 98% Physical Exam  EKG: KRUPA, STEGE JH:[id number] 18-Feb-2014 12:33:36 [facility] ONC ROUTINE RECORD  Poor data quality, interpretation may be adversely affected Normal sinus rhythm Normal ECG No significant change since 10/19/12 Confirmed by [facility] MD, PETER [id number]) on 02/19/2014 11:55:23 AM 44mm/s 2mm/mV 100Hz  8.0 SP2 12SL 237 CID: [id number] Referred by: CINDY,Aram Domzalski Confirmed By: Jenkins Rouge MD Vent. rate 65 BPM PR interval 184 ms QRS duration 78 ms QT/QTc 434/451 ms P-R-T axes 60 43 69 03-Mar-1961 (53 yr) Female [place]: Loc:511 Technician: Test ind: : COMMENTS: Page 1 of 1 [id number] EDT: 11:55 19-Feb-2014 ORDER:  ASSESSMENT/PLAN:    Iron deficiency anemia, unspecified  Assessment & Plan Patient had completed all but the last 10 ML's of Feraheme iron infusion today; and experienced very mild, vague chest pain/chest pressure.  Iron infusion was held at that time; and EKG obtained.  EKG revealed a normal sinus rhythm with a rate of 65; and a QTC of 451.  Patient has plans to return to the [place] on 04/20/2014 for labs and follow up.   Chest pain  Assessment &  Plan Patient had completed all but the last 10 ML's of Feraheme iron infusion today; and experienced very mild, vague chest pain/chest pressure.  No shortness of breath or other symptoms whatsoever. Iron infusion was held at that time; and EKG obtained.  EKG revealed a normal sinus rhythm with a rate of 65; and a QTC of  451.  Patient refused O2; and also refused to be transported to the emergency department for further evaluation.  Patient was not given the last 10 MLs of her iron infusion today.  Patient was observed in the [place] infusion area for greater than one hour; with chest pain completely resolving.  Patient was encouraged to call or go directly to the emergency department if she once again develops any chest pain, chest pressure, or shortness of breath.  Patient stated full understanding of all instructions.   Patient stated understanding of all instructions; and was in agreement with this plan of care. The patient knows to call the clinic with any problems, questions or concerns.   Review/collaboration with Dr. Alen Blew regarding all aspects of patient's visit today.   Total time spent with patient was  40  minutes;  with greater than  75  percent of that time spent in face to face counseling regarding  her symptoms, review of most recent labs and today's EKG results, monitoring while in the infusion area,  and coordination of care and follow up.  Disclaimer: This note was dictated with voice recognition software. Similar sounding words can inadvertently be transcribed and may not be corrected upon review.   Drue Second, NP 02/28/2014

## 2014-02-28 NOTE — Progress Notes (Signed)
   I personally reassessed the patient and her symptoms. I have discussed her case with Dr. Ron Parker, who examined her earlier today. The patient was given a dose of IV Lasix today at 1302. She reports that she is responding to the Lasix and is diuresising. Her breathing is also improved. Since she seems to be responding well to the lasix, we will hold her evening dose of IV Lasix to allow her kidneys to rest.  Will cancel order for IV Lasix at 1800. Plan for a f/u BMET in the am to reassess renal function. If she continues to show signs of improvement, we will continue to treat as acute diastolic HF and patient may be able to be discharged home tomorrow with close OP f/u.  However, if symptoms worsen or if she develops CP, we will consider proceeding with a LHC.  Cardiology will reassess in the am.   Lyda Jester, PA-C  This is the exact plan that I discussed with B Simmons, PA-C.    Daryel November, MD

## 2014-02-28 NOTE — Progress Notes (Signed)
  Echocardiogram 2D Echocardiogram has been performed.  Bobbye Charleston 02/28/2014, 11:00 AM

## 2014-02-28 NOTE — Consult Note (Signed)
CARDIOLOGY CONSULT NOTE   Patient ID: Jessica Howard MRN: [id number] DOB/AGE: [birth year] 53 y.o.  Admit Date: 02/27/2014 Primary Physician: Merrilee Seashore, MD Primary Cardiologist    Had appointment to see Dr. Percival Spanish as new patient 10/9.  Ron Parker seeing new today  Clinical Summary Jessica Howard is a 53 y.o.female. She is morbidly obese. She has a history is significant for rheumatoid arthritis for which she receives methotrexate and ongoing prednisone. Other significant risk factors for coronary disease include hypertension, diabetes, dyslipidemia. The patient also has thalassemia and significant iron deficiency anemia.  Over the past several weeks she has had exertional shortness of breath. Her thiazide diuretic dose was increased as an outpatient. She was having an iron infusion and felt increased shortness of breath and some chest heaviness. This occurred last week. Since then she's had further problems with low back pain and shortness of breath. Yesterday along with her low back pain his shortness or breath she had additional anterior chest discomfort. She came to the emergency room. EKG so far have shown no diagnostic changes. Troponins so far are normal.  Historically the patient had a stress test greater than 5 years ago. By history this may have been a pharmacologic nuclear study. I do not have the specific data.     Allergies  Allergen Reactions  . Bactrim [Sulfamethoxazole-Tmp Ds] Hives  . Cefuroxime Axetil Itching  . Cephalosporins Itching  . Lisinopril Cough  . Penicillins Hives  . Sulfa Antibiotics Hives  . Iohexol Itching and Rash     Code: RASH, Desc: HAD ITCHING AND A RASH ABOUT ONE HOUR AFTER RETURNING HOME FROM THE CT, Onset Date: [id number]     Medications Scheduled Medications: . albuterol  2.5 mg Nebulization TID  . amitriptyline  25 mg Oral QHS  . aspirin EC  81 mg Oral Daily  . atorvastatin  10 mg Oral q1800  . chlorthalidone  25 mg Oral Daily  .  diltiazem  240 mg Oral Daily  . enoxaparin (LOVENOX) injection  40 mg Subcutaneous Daily  . gabapentin  600 mg Oral BID  . [START ON 03/01/2014] Influenza vac split quadrivalent PF  0.5 mL Intramuscular Tomorrow-1000  . insulin aspart  0-15 Units Subcutaneous TID WC  . insulin aspart  0-5 Units Subcutaneous QHS  . insulin NPH Human  75 Units Subcutaneous TID WC  . ipratropium  0.5 mg Nebulization Q6H  . irbesartan  300 mg Oral Daily  . methotrexate  20 mg Subcutaneous Q Sun  . nebivolol  20 mg Oral Daily  . pantoprazole  40 mg Oral Daily  . predniSONE  5 mg Oral Q breakfast  . sodium chloride  500 mL Intravenous Once  . sodium chloride  3 mL Intravenous Q12H  . sodium chloride  3 mL Intravenous Q12H  . topiramate  25 mg Oral QHS     Infusions:     PRN Medications:  sodium chloride, acetaminophen, acetaminophen, fluticasone, nitroGLYCERIN, ondansetron (ZOFRAN) IV, ondansetron, oxyCODONE, sodium chloride, tiZANidine, zolpidem   Past Medical History  Diagnosis Date  . Unspecified deficiency anemia   . Hypertension   . Diabetes mellitus   . Hyperlipidemia   . Rheumatoid arthritis(714.0)   . Endometrial polyp   . Fibroadenoma of breast     left  . Hepatic steatosis   . Sleep apnea     Past Surgical History  Procedure Laterality Date  . Tubal ligation      bilateral  . Hand surgery  for arthritis  . Tonsillectomy    . Left knee surgery nodule removal    . Other surgical history      multiple nodule removal on neck, hands, left knee  . Dilation and curettage of uterus      Family History  Problem Relation Age of Onset  . Diabetes Mother   . Cancer Mother     breast  . Diabetes Sister   . Cancer Brother     bone marrow cancer    Social History Jessica Howard reports that she has been smoking.  She has never used smokeless tobacco. Jessica Howard reports that she does not drink alcohol.  Review of Systems   Patient denies fever, chills, headache, sweats,  rash, change in vision, change in hearing, cough, nausea vomiting, urinary symptoms. All other systems are reviewed and are negative other than the history of present illness.  Physical Examination Blood pressure 107/71, pulse 70, temperature 98 F (36.7 C), temperature source Oral, resp. rate 18, height 5\' 3"  (1.6 m), weight 301 lb 4.8 oz (136.669 kg), last menstrual period 12/27/2013, SpO2 98.00%. No intake or output data in the 24 hours ending 02/28/14 1610  The patient is morbidly obese. There is a family member in the room. She is oriented to person time and place. Affect is normal. Head is atraumatic. There is no jugulovenous distention. Lungs reveal no definite rales. There is no respiratory distress. However she feels short of breath walking to the bathroom. Sclera and conjunctiva are normal. Cardiac exam her vitals S1 and S2. The abdomen is soft. The patient does have peripheral edema. There are no significant skin rashes.  Prior Cardiac Testing/Procedures  Lab Results  Basic Metabolic Panel:  Recent Labs Lab 02/27/14 2001 02/28/14 0056  NA 139 137  K 4.7 4.1  CL 102 101  CO2 23 24  GLUCOSE 87 75  BUN 29* 29*  CREATININE 1.29* 1.17*  CALCIUM 8.9 8.9    Liver Function Tests: No results found for this basename: AST, ALT, ALKPHOS, BILITOT, PROT, ALBUMIN,  in the last 168 hours  CBC:  Recent Labs Lab 02/27/14 2001  WBC 12.7*  HGB 9.4*  HCT 29.2*  MCV 66.1*  PLT 334    Cardiac Enzymes:  Recent Labs Lab 02/28/14 0056  TROPONINI <0.30    BNP: No components found with this basename: POCBNP,    Radiology: Dg Chest 2 View  02/27/2014   CLINICAL DATA:  Chest pain.  Short of breath.  EXAM: CHEST  2 VIEW  COMPARISON:  02/15/2014.  FINDINGS: Cardiac silhouette is mildly enlarged. Normal mediastinal and hilar contours. Mild scarring in the right upper lobe, stable. No lung consolidation or edema. No pleural effusion or pneumothorax.  Bony thorax is demineralized  but grossly intact.  IMPRESSION: No acute cardiopulmonary disease.   Electronically Signed   By: Lajean Manes M.D.   On: 02/27/2014 21:13     ECG:   I have reviewed the EKGs from this admission. There no definite abnormalities.  Telemetry:    I have personally reviewed telemetry today February 28, 2014. There is normal sinus rhythm.   Impression and Recommendations  At this time it is not completely clear what the primary problem is. Her shortness of breath is concerning. Chest discomfort is concerning. She does not have obvious rales on physical exam. She does have edema. It is difficult to know at this point what the basis of her shortness of breath is. I feel we should proceed  first with a 2-D echo to assess her LV function. I feel she needs a trial also of IV Lasix to see her response. When we know what her LV function is and we see the response to the diuretic, we can decide the next step. It may be necessary to proceed with cardiac catheterization. I'm not sure that exercise testing will give [place] the answer is that we need. However first we need echo data and further information about her response to diuretics, watching her renal function.    Iron deficiency anemia, unspecified     Her anemia is followed by oncology. Her iron infusion had to be stopped recently when she had some shortness of breath during the infusion.    Chest pain     I have mentioned above that I am not sure yet what the etiology of her chest discomfort is. We will need to rule out both pulmonary component and ischemic disease.    Thalassemia      This is followed by oncology.    On prednisone therapy     Her prednisone dose was recently increased as an outpatient. I do not know if this has played a role with her volume status.    Rheumatoid arthritis     She has significant rheumatoid arthritis requiring significant medications. This is an additional risk factor for coronary disease.    Morbid obesity      Her obesity plays a significant role with her overall health. I feel that it is not related to her most recent worsening of symptoms.    Shortness of breath     At this point it is still not clear if this is related to pulmonary difficulties, volume status, or ischemia. All of these will be evaluated.    Renal insufficiency     Patient has mild renal insufficiency. She will receive Lasix today. Her renal function will be followed carefully.    Edema    She does have some edema. She will receive a dose of diuretics today.    Back pain     Her back pain is low back pain. I doubt that it represents cardiac ischemia.    Essential hypertension     Blood pressure is well controlled. In fact she had some mild hypotension yesterday.    Diabetes     Diabetes is being treated by her primary team.    Dyslipidemia     Lipids are being treated by her primary team.    Obstructive sleep apnea     This is also being treated.  Daryel November, MD 02/28/2014, 8:28 AM

## 2014-02-28 NOTE — Progress Notes (Signed)
UR completed 

## 2014-03-01 ENCOUNTER — Inpatient Hospital Stay (HOSPITAL_COMMUNITY): Admission: RE | Admit: 2014-03-01 | Payer: Medicare Other | Source: Ambulatory Visit

## 2014-03-01 DIAGNOSIS — R609 Edema, unspecified: Secondary | ICD-10-CM | POA: Diagnosis not present

## 2014-03-01 DIAGNOSIS — N179 Acute kidney failure, unspecified: Secondary | ICD-10-CM | POA: Diagnosis not present

## 2014-03-01 DIAGNOSIS — I5033 Acute on chronic diastolic (congestive) heart failure: Secondary | ICD-10-CM | POA: Diagnosis not present

## 2014-03-01 DIAGNOSIS — I10 Essential (primary) hypertension: Secondary | ICD-10-CM | POA: Diagnosis not present

## 2014-03-01 DIAGNOSIS — R079 Chest pain, unspecified: Secondary | ICD-10-CM | POA: Diagnosis not present

## 2014-03-01 DIAGNOSIS — R0602 Shortness of breath: Secondary | ICD-10-CM

## 2014-03-01 DIAGNOSIS — I509 Heart failure, unspecified: Secondary | ICD-10-CM

## 2014-03-01 LAB — BASIC METABOLIC PANEL
ANION GAP: 13 (ref 5–15)
BUN: 33 mg/dL — AB (ref 6–23)
CHLORIDE: 97 meq/L (ref 96–112)
CO2: 26 meq/L (ref 19–32)
CREATININE: 1.14 mg/dL — AB (ref 0.50–1.10)
Calcium: 8.6 mg/dL (ref 8.4–10.5)
GFR calc Af Amer: 63 mL/min — ABNORMAL LOW (ref >90)
GFR calc non Af Amer: 55 mL/min — ABNORMAL LOW (ref >90)
Glucose, Bld: 201 mg/dL — ABNORMAL HIGH (ref 70–99)
Potassium: 4.6 mEq/L (ref 3.7–5.3)
Sodium: 136 mEq/L — ABNORMAL LOW (ref 137–147)

## 2014-03-01 LAB — GLUCOSE, CAPILLARY
GLUCOSE-CAPILLARY: 204 mg/dL — AB (ref 70–99)
Glucose-Capillary: 238 mg/dL — ABNORMAL HIGH (ref 70–99)

## 2014-03-01 MED ORDER — FUROSEMIDE 40 MG PO TABS
40.0000 mg | ORAL_TABLET | Freq: Every day | ORAL | Status: DC
  Administered 2014-03-01: 40 mg via ORAL
  Filled 2014-03-01: qty 1

## 2014-03-01 MED ORDER — FUROSEMIDE 20 MG PO TABS: 10.0000 mg | ORAL_TABLET | Freq: Every day | ORAL | Status: DC

## 2014-03-01 NOTE — Progress Notes (Signed)
-   Agree with plan end date is stated above. - Given single IV Lasix. Monitor strict I.'s and O.'s and electrolytes. Radiology on board. - Echo showed left ventricle wall thickness.

## 2014-03-01 NOTE — Discharge Summary (Signed)
-   Patient was given a single dose of IV Lasix with good diuresis. An echo was done that showed mild LVH and questionable diastolic heart failure. She will home on Lasix 10 mg by mouth daily. - She will probably get a stress as an outpatient to be scheduled by cardiology.

## 2014-03-01 NOTE — Discharge Summary (Signed)
Physician Discharge Summary  Jessica Howard IEP:[id number] DOB: [birth year] DOA: 02/27/2014  PCP: Merrilee Seashore, MD  Admit date: 02/27/2014 Discharge date: 03/01/2014  Time spent: 45 minutes  Recommendations for Outpatient Follow-up:  1. Follow up with PCP for hemoglobin recheck, and BMET for monitoring of kidney function 2. Make an appointment with Cardiologist, Dr. Martinique, for further assessment of chest pain 3. Discharge to home  Discharge Diagnoses:  Active Problems:   Iron deficiency anemia, unspecified   Chest pain   Thalassemia   On prednisone therapy   Rheumatoid arthritis   Morbid obesity   Shortness of breath   Renal insufficiency   Edema   Back pain   Essential hypertension   Diabetes   Dyslipidemia   Obstructive sleep apnea   Acute on chronic diastolic congestive heart failure, NYHA class 2   Discharge Condition: stable  Diet recommendation: carb modified, heart healthy diet  Filed Weights   02/28/14 0003 03/01/14 0500  Weight: 136.669 kg (301 lb 4.8 oz) 136.986 kg (302 lb)    History of present illness:  Jessica Howard is a 53 y.o.female with PMH of thalassemia, Hypertension, Diabetes, Dyslipidemia, Rheumatoid arthritis, and iron deficiency anemia, admitted on 02/27/14, who presented with worsening shortness of breath present for the past few weeks. States that last week she was getting an iron infusion for thalassemia and felt increased shortness of breath and some chest heaviness. Since then, she has had further problems with low back pain and shortness of breath. She presented to ED after chest pain increased in severity. Recently, thiazide diuretic dose was increased as an outpatient. EKG so far have shown no diagnostic changes. Troponins so far are normal.  Historically the patient had a stress test greater than 5 years ago. By history this may have been a pharmacologic nuclear study. I do not have the specific data   Hospital Course:  Acute on  chronic diastolic CHF, Class II  -Presented with chest pain, increased shortness of breath, and BLE edema- on discharge, persistent shortness of breath,  chest pain has resolved, and bilateral lungs with good air movement and no rales or rhonchi. A 2D echo- EF 55%, increased wall thickness and with moderate LVH. Patient is given one dose of IV lasix 40 mg and continued on 40 mg oral Lasix. Patient had good response with 2L urine output overnight, and with net negative 1 liter. -Patient was followed by Cardiology and they recommend an outpatient follow up for a [facility] for further assessment of chest pain. -On discharge, placed on Lasix 10 mg PO daily.  -Follow up BMET to reassess electrolytes and kidney function -Follow up with cardiologist for further assessment of chest pain  Leukocytosis  -WBC 12  -CXR, UA unremarkable   Back pain  -patient states she has had about 40 weight gain in past two months and this is likely a component of back pain- counseled on importance of proper diet and regular exercise for weight loss -PRN pain management  -Follow up with PCP for further assessment of back pain  Essential hypertension  -BP stable  -continue home regimen of Bystolic and diltiazem, and Irbesartan  Diabetes  -CBGs stable  -continue Humulin 75 units and SSI   Thalassemia  -Followed by oncology- last iron infusion about two weeks ago and couldn't be completed due to complaints of shortness of breath  -Follow up with oncology for Iron infusion as instructed  Iron deficiency anemia, unspecified  -Hgb at 9.4  -follow up with PCP  for hemoglobin recheck  Dyslipidemia  -continue Lipitor   On prednisone therapy  -for treatmetn of RA  Rheumatoid Arthritis  -prednisone recently increased as outpatient  -continue home medication of methotrexate and prednisone   Renal insufficiency  -creatinine 1.14- slight increase likely from diuretic effect.  -Follow up BMET with  PCP  Depression  -continue amitriptyline   Migraine  -continue Topamax   Diabetic neuropathy  -continue gabapentin   GERD  -continue pantoprazole   Obstructive sleep apnea  -CPAP PRN   Morbid obesity  -BMI 53.5   Procedures: 2D echocardiogram   - EF 55%; Left ventricle: The cavity size was normal. Wall thickness was   increased in a pattern of moderate LVH. The estimated ejection   fraction was 55%. Wall motion was normal; there were no regional   wall motion abnormalities. Doppler parameters are consistent with   high ventricular filling pressure.  - Left atrium: The atrium was mildly dilated.  - Right ventricle: Images of the RV are very limited. There is some   sugestion of mild RV dysfunction. RV systolic pressure (S, est):   30 mm Hg.  - Pulmonary arteries: PA peak pressure: 30 mm Hg (S).  Consultations:  Cardiology  Discharge Exam: Filed Vitals:   03/01/14 0803  BP:   Pulse: 73  Temp:   Resp: 20     Exam Gen: Alert and oriented obese AA female in NAD.  HEENT: Normocephalic, atraumatic. Pupils symmertrical. Moist mucosa.  Chest: clear to auscultate bilaterally, no ronchi or rales  Cardiac: Regular rate and rhythm, S1-S2, no rubs murmurs or gallops.  Abdomen: soft, non tender, non distended, +bowel sounds. No guarding or rigidity  Extremities: Symmetrical in appearance without cyanosis. 1+ BLE edema  Neurological: Alert awake oriented to time place and person.  Psychiatric: Appears normal.   Discharge Instructions     Medication List    TAKE these medications       amitriptyline 25 MG tablet  Commonly known as:  ELAVIL  Take 25 mg by mouth daily.     baclofen 10 MG tablet  Commonly known as:  LIORESAL  Take 10 mg by mouth See admin instructions. May take 10 mg twice weekly if needed for headaches     BD INSULIN SYRINGE ULTRAFINE 31G X 15/64" 1 ML Misc  Generic drug:  Insulin Syringe-Needle Q-761     BYSTOLIC 20 MG Tabs  Generic drug:   Nebivolol HCl  Take 20 mg by mouth daily.     chlorthalidone 25 MG tablet  Commonly known as:  HYGROTON  Take 25 mg by mouth daily.     diltiazem 240 MG 24 hr capsule  Commonly known as:  CARDIZEM CD  Take 240 mg by mouth daily.     fluticasone 50 MCG/ACT nasal spray  Commonly known as:  FLONASE  Place 1 spray into both nostrils daily as needed for allergies or rhinitis.     furosemide 20 MG tablet  Commonly known as:  LASIX  Take 0.5 tablets (10 mg total) by mouth daily.     gabapentin 300 MG capsule  Commonly known as:  NEURONTIN  Take 600 mg by mouth 2 (two) times daily.     insulin lispro 100 UNIT/ML injection  Commonly known as:  HUMALOG  Inject 40 Units into the skin 3 (three) times daily before meals. Per sliding scale     insulin NPH Human 100 UNIT/ML injection  Commonly known as:  HUMULIN N,NOVOLIN N  Inject  75 Units into the skin 3 (three) times daily with meals. 75 units with breakfast; lunch 75 units; 75 units at dinner     irbesartan 300 MG tablet  Commonly known as:  AVAPRO  Take 300 mg by mouth daily.     Methotrexate Sodium (PF) 50 MG/2ML Soln  Inject 20 mg into the skin once a week. Takes on Sundays     ORENCIA 250 MG injection  Generic drug:  abatacept  Inject 1,000 mg into the vein every 28 (twenty-eight) days. Monthly Infusion     oxyCODONE-acetaminophen 5-325 MG per tablet  Commonly known as:  PERCOCET/ROXICET  Take 1-2 tablets by mouth every 4 (four) hours as needed for severe pain.     pantoprazole 40 MG tablet  Commonly known as:  PROTONIX  Take 40 mg by mouth daily.     prednisoLONE acetate 1 % ophthalmic suspension  Commonly known as:  PRED FORTE     predniSONE 5 MG tablet  Commonly known as:  DELTASONE  Take 5 mg by mouth daily.     rosuvastatin 10 MG tablet  Commonly known as:  CRESTOR  Take 10 mg by mouth daily.     tiZANidine 4 MG tablet  Commonly known as:  ZANAFLEX  Take 4 mg by mouth 2 (two) times daily as needed (limit 2  days per week).     topiramate 25 MG tablet  Commonly known as:  TOPAMAX  Take 25 mg by mouth at bedtime. Migraine preventative     VOLTAREN 1 % Gel  Generic drug:  diclofenac sodium  Apply 1 application topically 3 (three) times daily.      ASK your doctor about these medications       doxycycline 100 MG capsule  Commonly known as:  VIBRAMYCIN  Take 100 mg by mouth 2 (two) times daily.       Allergies  Allergen Reactions  . Bactrim [Sulfamethoxazole-Tmp Ds] Hives  . Cefuroxime Axetil Itching  . Cephalosporins Itching  . Lisinopril Cough  . Penicillins Hives  . Sulfa Antibiotics Hives  . Iohexol Itching and Rash     Code: RASH, Desc: HAD ITCHING AND A RASH ABOUT ONE HOUR AFTER RETURNING HOME FROM THE CT, Onset Date: [id number]        Follow-up Information   Schedule an appointment as soon as possible for a visit with Merrilee Seashore, MD.   Specialty:  Internal Medicine   Contact information:   [address] [place] [address] [phone]        The results of significant diagnostics from this hospitalization (including imaging, microbiology, ancillary and laboratory) are listed below for reference.    Significant Diagnostic Studies: Dg Chest 2 View  02/27/2014   CLINICAL DATA:  Chest pain.  Short of breath.  EXAM: CHEST  2 VIEW  COMPARISON:  02/15/2014.  FINDINGS: Cardiac silhouette is mildly enlarged. Normal mediastinal and hilar contours. Mild scarring in the right upper lobe, stable. No lung consolidation or edema. No pleural effusion or pneumothorax.  Bony thorax is demineralized but grossly intact.  IMPRESSION: No acute cardiopulmonary disease.   Electronically Signed   By: Lajean Manes M.D.   On: 02/27/2014 21:13    Microbiology: Recent Results (from the past 240 hour(s))  MRSA PCR SCREENING     Status: None   Collection Time    02/28/14 12:35 AM      Result Value Ref Range Status   MRSA by PCR NEGATIVE  NEGATIVE Final  Comment:             The GeneXpert MRSA Assay (FDA     approved for NASAL specimens     only), is one component of a     comprehensive MRSA colonization     surveillance program. It is not     intended to diagnose MRSA     infection nor to guide or     monitor treatment for     MRSA infections.  CULTURE, BLOOD (ROUTINE X 2)     Status: None   Collection Time    02/28/14  3:00 PM      Result Value Ref Range Status   Specimen Description BLOOD LEFT HAND   Final   Special Requests BOTTLES DRAWN AEROBIC ONLY 5CC   Final   Culture  Setup Time     Final   Value: 02/28/2014 22:30     Performed at [facility]   Culture     Final   Value:        BLOOD CULTURE RECEIVED NO GROWTH TO DATE CULTURE WILL BE HELD FOR 5 DAYS BEFORE ISSUING A FINAL NEGATIVE REPORT     Performed at [facility]   Report Status PENDING   Incomplete  CULTURE, BLOOD (ROUTINE X 2)     Status: None   Collection Time    02/28/14  3:15 PM      Result Value Ref Range Status   Specimen Description BLOOD RIGHT HAND   Final   Special Requests BOTTLES DRAWN AEROBIC ONLY 5CC   Final   Culture  Setup Time     Final   Value: 02/28/2014 22:31     Performed at [facility]   Culture     Final   Value:        BLOOD CULTURE RECEIVED NO GROWTH TO DATE CULTURE WILL BE HELD FOR 5 DAYS BEFORE ISSUING A FINAL NEGATIVE REPORT     Performed at [facility]   Report Status PENDING   Incomplete     Labs: Basic Metabolic Panel:  Recent Labs Lab 02/27/14 2001 02/28/14 0056 02/28/14 1102 03/01/14 0400  NA 139 137 139 136*  K 4.7 4.1 4.2 4.6  CL 102 101 101 97  CO2 23 24 22 26   GLUCOSE 87 75 51* 201*  BUN 29* 29* 30* 33*  CREATININE 1.29* 1.17* 1.09 1.14*  CALCIUM 8.9 8.9 8.5 8.6   Liver Function Tests:  Recent Labs Lab 02/28/14 1102  AST 23  ALT 15  ALKPHOS 67  BILITOT 0.3  PROT 6.7  ALBUMIN 3.2*   No results found for this basename: LIPASE, AMYLASE,  in the last 168 hours No results found  for this basename: AMMONIA,  in the last 168 hours CBC:  Recent Labs Lab 02/27/14 2001  WBC 12.7*  HGB 9.4*  HCT 29.2*  MCV 66.1*  PLT 334   Cardiac Enzymes:  Recent Labs Lab 02/28/14 0056 02/28/14 1102 02/28/14 1725  TROPONINI <0.30 <0.30 <0.30   BNP: BNP (last 3 results)  Recent Labs  02/27/14 2001  PROBNP 156.1*   CBG:  Recent Labs Lab 02/28/14 1133 02/28/14 1154 02/28/14 1651 02/28/14 2053 03/01/14 0728  GLUCAP 60* 75 116* 179* 204*       Signed:  Lacy Duverney PA-C  [facility] 03/01/2014, 11:16 AM

## 2014-03-01 NOTE — Progress Notes (Addendum)
Inpatient Diabetes Program Recommendations  AACE/ADA: New Consensus Statement on Inpatient Glycemic Control (2013)  Target Ranges:  Prepandial:   less than 140 mg/dL      Peak postprandial:   less than 180 mg/dL (1-2 hours)      Critically ill patients:  140 - 180 mg/dL     Results for AHLIYAH, NIENOW (MRN [id number]) as of 03/01/2014 07:42  Ref. Range 02/28/2014 08:07 02/28/2014 11:33 02/28/2014 11:54 02/28/2014 16:51 02/28/2014 20:53  Glucose-Capillary Latest Range: 70-99 mg/dL 84 60 (L) 75 116 (H) 179 (H)    Results for WAUNETTA, RIGGLE (MRN [id number]) as of 03/01/2014 07:42  Ref. Range 03/01/2014 04:00  Glucose Latest Range: 70-99 mg/dl 201 (H)    Home DM Meds: NPH insulin- 75 units tid Humalog insulin per SSI tidwc  Current Orders: NPH insulin- 75 units tid Novolog Moderate SSI tid ac + HS   MD- Please note that NPH insulin was held all day yesterday.  Patient received no NPH insulin yesterday (09/21).  Patient was hypoglycemic yesterday at 11:33am as well.  Lab glucose elevated at 201 mg/dl this AM.  May want to reduce NPH insulin to 1/3 home dose for now and titrate upward as needed- Change NPH insulin to 25 units tid     Will follow Wyn Quaker RN, MSN, CDE Diabetes Coordinator Inpatient Diabetes Program Team Pager: [phone] (8a-10p)

## 2014-03-01 NOTE — Progress Notes (Addendum)
TELEMETRY: Reviewed telemetry pt in NSR: Filed Vitals:   02/28/14 2100 02/28/14 2111 03/01/14 0500 03/01/14 0803  BP: 122/64  135/65   Pulse: 70  78 73  Temp: 98.4 F (36.9 C)  98.3 F (36.8 C)   TempSrc:      Resp: 20  20 20   Height:      Weight:   302 lb (136.986 kg)   SpO2: 100% 98% 99% 95%    Intake/Output Summary (Last 24 hours) at 03/01/14 0930 Last data filed at 03/01/14 0500  Gross per 24 hour  Intake    843 ml  Output   1900 ml  Net  -1057 ml   Filed Weights   02/28/14 0003 03/01/14 0500  Weight: 301 lb 4.8 oz (136.669 kg) 302 lb (136.986 kg)    Subjective Patient still complains of SOB- worse with exertion. No chest pain. States SOB is unchanged even after >1liter diruesis.  Marland Kitchen amitriptyline  25 mg Oral QHS  . aspirin EC  81 mg Oral Daily  . atorvastatin  10 mg Oral q1800  . diltiazem  240 mg Oral Daily  . enoxaparin (LOVENOX) injection  65 mg Subcutaneous Q24H  . gabapentin  600 mg Oral BID  . Influenza vac split quadrivalent PF  0.5 mL Intramuscular Tomorrow-1000  . insulin aspart  0-15 Units Subcutaneous TID WC  . insulin aspart  0-5 Units Subcutaneous QHS  . insulin NPH Human  75 Units Subcutaneous TID WC  . ipratropium-albuterol  3 mL Nebulization TID  . irbesartan  300 mg Oral Daily  . methotrexate  20 mg Subcutaneous Q Sun  . nebivolol  20 mg Oral Daily  . pantoprazole  40 mg Oral Daily  . predniSONE  5 mg Oral Q breakfast  . sodium chloride  500 mL Intravenous Once  . sodium chloride  3 mL Intravenous Q12H  . sodium chloride  3 mL Intravenous Q12H  . topiramate  25 mg Oral QHS      LABS: Basic Metabolic Panel:  Recent Labs  02/28/14 1102 03/01/14 0400  NA 139 136*  K 4.2 4.6  CL 101 97  CO2 22 26  GLUCOSE 51* 201*  BUN 30* 33*  CREATININE 1.09 1.14*  CALCIUM 8.5 8.6   Liver Function Tests:  Recent Labs  02/28/14 1102  AST 23  ALT 15  ALKPHOS 67  BILITOT 0.3  PROT 6.7  ALBUMIN 3.2*   No results found for this  basename: LIPASE, AMYLASE,  in the last 72 hours CBC:  Recent Labs  02/27/14 2001  WBC 12.7*  HGB 9.4*  HCT 29.2*  MCV 66.1*  PLT 334   Cardiac Enzymes:  Recent Labs  02/28/14 0056 02/28/14 1102 02/28/14 1725  TROPONINI <0.30 <0.30 <0.30   BNP:  Recent Labs  02/27/14 2001  PROBNP 156.1*   D-Dimer: No results found for this basename: DDIMER,  in the last 72 hours Hemoglobin A1C: No results found for this basename: HGBA1C,  in the last 72 hours Fasting Lipid Panel: No results found for this basename: CHOL, HDL, LDLCALC, TRIG, CHOLHDL, LDLDIRECT,  in the last 72 hours Thyroid Function Tests: No results found for this basename: TSH, T4TOTAL, FREET3, T3FREE, THYROIDAB,  in the last 72 hours   Radiology/Studies:  Dg Chest 2 View  02/27/2014   CLINICAL DATA:  Chest pain.  Short of breath.  EXAM: CHEST  2 VIEW  COMPARISON:  02/15/2014.  FINDINGS: Cardiac silhouette is mildly enlarged. Normal mediastinal and  hilar contours. Mild scarring in the right upper lobe, stable. No lung consolidation or edema. No pleural effusion or pneumothorax.  Bony thorax is demineralized but grossly intact.  IMPRESSION: No acute cardiopulmonary disease.   Electronically Signed   By: Lajean Manes M.D.   On: 02/27/2014 21:13   Ecg: Normal.  Echo: Study Conclusions  - Left ventricle: The cavity size was normal. Wall thickness was increased in a pattern of moderate LVH. The estimated ejection fraction was 55%. Wall motion was normal; there were no regional wall motion abnormalities. Doppler parameters are consistent with high ventricular filling pressure. - Left atrium: The atrium was mildly dilated. - Right ventricle: Images of the RV are very limited. There is some sugestion of mild RV dysfunction. RV systolic pressure (S, est): 30 mm Hg. - Pulmonary arteries: PA peak pressure: 30 mm Hg (S).    PHYSICAL EXAM General: Well developed, morbidly obese, in no acute distress. Head:  Normocephalic, atraumatic, sclera non-icteric, oropharynx is clear Neck: Negative for carotid bruits. JVD difficult to assess. No adenopathy Lungs: Clear bilaterally to auscultation without wheezes, rales, or rhonchi. Breathing is unlabored. Heart: RRR S1 S2 without murmurs, rubs, or gallops.  Abdomen: Soft, non-tender, non-distended with normoactive bowel sounds. No hepatomegaly. No rebound/guarding. No obvious abdominal masses. Msk:  Strength and tone appears normal for age. Extremities: 1-2+ edema.  Distal pedal pulses are 2+ and equal bilaterally. Neuro: Alert and oriented X 3. Moves all extremities spontaneously. Psych:  Responds to questions appropriately with a normal affect.  ASSESSMENT AND PLAN: 1. Dyspnea. Multifactorial. I think there is a component of acute diastolic CHF but CXR is unremarkable and BNP is underwhelming. Good diuretic response to IV lasix x 1 but no real clinical response. Other etiologies include anemia, morbid obesity with hypoventilation and OSA. No significant pulmonary HTN by Echo. Would recommend starting po lasix for continued diuresis. Watch renal function.  2. Chest pain. Patient only reports one episode of chest pain last Sunday. Now only with dyspnea. Normal Troponins and Ecg. Echo shows normal systolic function. I think noninvasive evaluation is reasonable with Lexiscan myoview at some point. This could be done later as an outpatient.   3. Morbid obesity with hypoventilation/OSA on CPAP therapy.  4. Iron deficiency anemia.  5. Thalassemia  6. RA on methotrexate and prednisone.  7. HTN controlled.  8. DM    Present on Admission:  . Thalassemia . Rheumatoid arthritis . Morbid obesity . Shortness of breath . Renal insufficiency . Edema . Back pain . Essential hypertension . Diabetes . Dyslipidemia . Obstructive sleep apnea . Acute on chronic diastolic congestive heart failure, NYHA class 2  Signed, Mark Hassey [place], [place] 03/01/2014 9:30  AM

## 2014-03-01 NOTE — Progress Notes (Signed)
Patient son advised that he would place patient on the CPAP when he left for the night.  RT checked the settings 5.5 (home settings) and checked that there was water in the chamber. RT will continue to monitor.

## 2014-03-06 LAB — CULTURE, BLOOD (ROUTINE X 2)
CULTURE: NO GROWTH
Culture: NO GROWTH

## 2014-03-07 DIAGNOSIS — Z23 Encounter for immunization: Secondary | ICD-10-CM | POA: Diagnosis not present

## 2014-03-07 DIAGNOSIS — D563 Thalassemia minor: Secondary | ICD-10-CM | POA: Diagnosis not present

## 2014-03-07 DIAGNOSIS — I509 Heart failure, unspecified: Secondary | ICD-10-CM | POA: Diagnosis not present

## 2014-03-07 DIAGNOSIS — D649 Anemia, unspecified: Secondary | ICD-10-CM | POA: Diagnosis not present

## 2014-03-16 ENCOUNTER — Encounter: Payer: Self-pay | Admitting: *Deleted

## 2014-03-18 ENCOUNTER — Ambulatory Visit (INDEPENDENT_AMBULATORY_CARE_PROVIDER_SITE_OTHER): Payer: Medicare Other | Admitting: Cardiology

## 2014-03-18 ENCOUNTER — Encounter: Payer: Self-pay | Admitting: Cardiology

## 2014-03-18 VITALS — BP 122/78 | HR 88 | Ht 63.0 in | Wt 291.0 lb

## 2014-03-18 DIAGNOSIS — R079 Chest pain, unspecified: Secondary | ICD-10-CM | POA: Diagnosis not present

## 2014-03-18 DIAGNOSIS — R0602 Shortness of breath: Secondary | ICD-10-CM

## 2014-03-18 NOTE — Progress Notes (Signed)
HPI  This is the first appointment for this patient with me. We saw her recently when she was hospitalized with shortness of breath.  Her BNP was not particularly elevated.  She was treated with 1 dose of diuretic. However, her ejection fraction was fine. He was not dyspnea was probably multifactorial secondary to obstructive sleep apnea, hypoventilation obesity syndrome and anemia. She did have chest pain but no evidence of ischemia. It was suggested that she might need outpatient stress testing.  Today she has been having some chest discomfort. She's is somewhat different than the discomfort she described in the hospital. She reports "little pains". This is in her left upper chest and a little bit under her left breast. It seems to be somewhat sharp. It does hurt a little bit to move. It comes and goes briefly. She has her chronic dyspnea but this seems to be unchanged. She's not describing any new palpitations, presyncope or syncope. She has no new PND or orthopnea. He had a significant weight gain over the summer but this seems to be stable since discharge.    Allergies  Allergen Reactions  . Bactrim [Sulfamethoxazole-Tmp Ds] Hives  . Cefuroxime Axetil Itching  . Cephalosporins Itching  . Lisinopril Cough  . Penicillins Hives  . Sulfa Antibiotics Hives  . Iohexol Itching and Rash     Code: RASH, Desc: HAD ITCHING AND A RASH ABOUT ONE HOUR AFTER RETURNING HOME FROM THE CT, Onset Date: [id number]     Current Outpatient Prescriptions  Medication Sig Dispense Refill  . abatacept (ORENCIA) 250 MG injection Inject 1,000 mg into the vein every 28 (twenty-eight) days. Monthly Infusion      . amitriptyline (ELAVIL) 25 MG tablet Take 25 mg by mouth daily.       . baclofen (LIORESAL) 10 MG tablet Take 10 mg by mouth See admin instructions. May take 10 mg twice weekly if needed for headaches      . BD INSULIN SYRINGE ULTRAFINE 31G X 15/64" 1 ML MISC       . chlorthalidone (HYGROTON) 25 MG tablet  Take 25 mg by mouth daily.      Marland Kitchen diltiazem (CARDIZEM CD) 240 MG 24 hr capsule Take 240 mg by mouth daily.       . fluticasone (FLONASE) 50 MCG/ACT nasal spray Place 1 spray into both nostrils daily as needed for allergies or rhinitis.      . furosemide (LASIX) 20 MG tablet Take 0.5 tablets (10 mg total) by mouth daily.  15 tablet  0  . gabapentin (NEURONTIN) 300 MG capsule Take 600 mg by mouth 2 (two) times daily.      . insulin lispro (HUMALOG) 100 UNIT/ML injection Inject 40 Units into the skin 3 (three) times daily before meals. Per sliding scale      . insulin NPH (HUMULIN N,NOVOLIN N) 100 UNIT/ML injection Inject 75 Units into the skin 3 (three) times daily with meals. 75 units with breakfast; lunch 75 units; 75 units at dinner      . irbesartan (AVAPRO) 300 MG tablet Take 300 mg by mouth daily.       . Methotrexate Sodium, PF, 50 MG/2ML SOLN Inject 20 mg into the skin once a week. Takes on Sundays      . Nebivolol HCl (BYSTOLIC) 20 MG TABS Take 20 mg by mouth daily.      Marland Kitchen oxyCODONE-acetaminophen (PERCOCET/ROXICET) 5-325 MG per tablet Take 1-2 tablets by mouth every 4 (four) hours as needed  for severe pain.      . pantoprazole (PROTONIX) 40 MG tablet Take 40 mg by mouth daily.      . prednisoLONE acetate (PRED FORTE) 1 % ophthalmic suspension       . predniSONE (DELTASONE) 5 MG tablet Take 5 mg by mouth daily.      . rosuvastatin (CRESTOR) 10 MG tablet Take 10 mg by mouth daily.      Marland Kitchen tiZANidine (ZANAFLEX) 4 MG tablet Take 4 mg by mouth 2 (two) times daily as needed (limit 2 days per week).       . topiramate (TOPAMAX) 25 MG tablet Take 25 mg by mouth at bedtime. Migraine preventative      . VOLTAREN 1 % GEL Apply 1 application topically 3 (three) times daily.        No current facility-administered medications for this visit.    Past Medical History  Diagnosis Date  . Unspecified deficiency anemia   . Hypertension   . Diabetes mellitus   . Hyperlipidemia   . Rheumatoid  arthritis(714.0)   . Endometrial polyp   . Fibroadenoma of breast     left  . Hepatic steatosis   . Sleep apnea     Past Surgical History  Procedure Laterality Date  . Tubal ligation      bilateral  . Hand surgery      for arthritis  . Tonsillectomy    . Left knee surgery nodule removal    . Other surgical history      multiple nodule removal on neck, hands, left knee  . Dilation and curettage of uterus      ROS:  As stated in the HPI and negative for all other systems.  PHYSICAL EXAM BP 122/78  Pulse 88  Ht 5\' 3"  (1.6 m)  Wt 291 lb (131.997 kg)  BMI 51.56 kg/m2  LMP 12/27/2013 GENERAL:  Well appearing HEENT:  Pupils equal round and reactive, fundi not visualized, oral mucosa unremarkable NECK:  No jugular venous distention, waveform within normal limits, carotid upstroke brisk and symmetric, no bruits, no thyromegaly LYMPHATICS:  No cervical, inguinal adenopathy LUNGS:  Clear to auscultation bilaterally BACK:  No CVA tenderness CHEST:  Unremarkable HEART:  PMI not displaced or sustained,S1 and S2 within normal limits, no S3, no S4, no clicks, no rubs, no murmurs ABD:  Flat, positive bowel sounds normal in frequency in pitch, no bruits, no rebound, no guarding, no midline pulsatile mass, no hepatomegaly, no splenomegaly, obese EXT:  2 plus pulses throughout, mild bilateral edema, no cyanosis no clubbing SKIN:  No rashes no nodules NEURO:  Cranial nerves II through XII grossly intact, motor grossly intact throughout PSYCH:  Cognitively intact, oriented to person place and time  EKG:  Sinus rhythm, rate 88, axis within normal limits, intervals within normal limits, no acute ST-T wave changes. 03/18/2014   ASSESSMENT AND PLAN  DYSPNEA:  As described in the hospital I think this is likely multifactorial. She probably does have some right-sided failure. It was not possible to assess her pulmonary pressures by echo. She may have hypoventilation obesity syndrome. I think the  most significant intervention would be salt restriction. At this point I will defer to [facility], MD any consideration of a pulmonary appointment. I'm not clear whether her rheumatologic disease could be adding to her dyspnea by involving the lungs. She will remain on the meds as listed.  HTN:  The blood pressure is at target. No change in medications is indicated. We  will continue with therapeutic lifestyle changes (TLC).  CHEST PAIN:  For pain is very atypical although she does have risk factors. She needs screening with stress testing. She would not be a walk on a treadmill. Therefore, she will have a [employer].

## 2014-03-18 NOTE — Patient Instructions (Signed)
We are ordering a stress test  Your physician recommends that you schedule a follow-up appointment in:  As needed

## 2014-03-19 ENCOUNTER — Emergency Department (HOSPITAL_COMMUNITY)
Admission: EM | Admit: 2014-03-19 | Discharge: 2014-03-20 | Disposition: A | Discharge status: Home / Self Care | Payer: Medicare Other | Attending: Emergency Medicine | Admitting: Emergency Medicine

## 2014-03-19 ENCOUNTER — Encounter (HOSPITAL_COMMUNITY): Payer: Self-pay | Admitting: Emergency Medicine

## 2014-03-19 DIAGNOSIS — Z8742 Personal history of other diseases of the female genital tract: Secondary | ICD-10-CM | POA: Diagnosis not present

## 2014-03-19 DIAGNOSIS — Z87891 Personal history of nicotine dependence: Secondary | ICD-10-CM | POA: Insufficient documentation

## 2014-03-19 DIAGNOSIS — S92401A Displaced unspecified fracture of right great toe, initial encounter for closed fracture: Secondary | ICD-10-CM | POA: Diagnosis not present

## 2014-03-19 DIAGNOSIS — S92421A Displaced fracture of distal phalanx of right great toe, initial encounter for closed fracture: Secondary | ICD-10-CM | POA: Diagnosis not present

## 2014-03-19 DIAGNOSIS — Z880 Allergy status to penicillin: Secondary | ICD-10-CM | POA: Insufficient documentation

## 2014-03-19 DIAGNOSIS — M069 Rheumatoid arthritis, unspecified: Secondary | ICD-10-CM | POA: Diagnosis not present

## 2014-03-19 DIAGNOSIS — E785 Hyperlipidemia, unspecified: Secondary | ICD-10-CM | POA: Insufficient documentation

## 2014-03-19 DIAGNOSIS — Z7952 Long term (current) use of systemic steroids: Secondary | ICD-10-CM | POA: Diagnosis not present

## 2014-03-19 DIAGNOSIS — S99921A Unspecified injury of right foot, initial encounter: Secondary | ICD-10-CM | POA: Diagnosis present

## 2014-03-19 DIAGNOSIS — Z862 Personal history of diseases of the blood and blood-forming organs and certain disorders involving the immune mechanism: Secondary | ICD-10-CM | POA: Insufficient documentation

## 2014-03-19 DIAGNOSIS — Z79899 Other long term (current) drug therapy: Secondary | ICD-10-CM | POA: Diagnosis not present

## 2014-03-19 DIAGNOSIS — Y9289 Other specified places as the place of occurrence of the external cause: Secondary | ICD-10-CM | POA: Diagnosis not present

## 2014-03-19 DIAGNOSIS — W1839XA Other fall on same level, initial encounter: Secondary | ICD-10-CM | POA: Insufficient documentation

## 2014-03-19 DIAGNOSIS — I10 Essential (primary) hypertension: Secondary | ICD-10-CM | POA: Insufficient documentation

## 2014-03-19 DIAGNOSIS — Z794 Long term (current) use of insulin: Secondary | ICD-10-CM | POA: Diagnosis not present

## 2014-03-19 DIAGNOSIS — E119 Type 2 diabetes mellitus without complications: Secondary | ICD-10-CM | POA: Insufficient documentation

## 2014-03-19 DIAGNOSIS — Y9389 Activity, other specified: Secondary | ICD-10-CM | POA: Diagnosis not present

## 2014-03-19 LAB — CBG MONITORING, ED: Glucose-Capillary: 155 mg/dL — ABNORMAL HIGH (ref 70–99)

## 2014-03-19 NOTE — ED Provider Notes (Signed)
CSN: [id number]     Arrival date & time 03/19/14  2119 History   This chart was scribed for Jessica Senior, PA-C working with Jessica Acosta, MD by Randa Evens, ED Scribe. This patient was seen in room TR05C/TR05C and the patient's care was started at 10:32 PM.     No chief complaint on file.   HPI HPI Comments: Jessica Howard is a 53 y.o. female who presents to the Emergency Department complaining of right great toe injury onset 8 days ago. She states she injured the toe in a fall about 1 week ago. She states the toe recently worsened over the past 6 days. She has associated swelling and discoloration of the toe. She present with a large blister on the top of the great toe with some associated discharge. She states that  She states she has a Hx of DM. She states that her blood sugar before arrival was 348. She states she has been compliant with all her diabetic medications. Denies fever, chills. States has poor sensation in her feet due to her diabetes. No other complaints.  Past Medical History  Diagnosis Date  . Unspecified deficiency anemia   . Hypertension   . Diabetes mellitus   . Hyperlipidemia   . Rheumatoid arthritis(714.0)   . Endometrial polyp   . Fibroadenoma of breast     left  . Hepatic steatosis   . Sleep apnea    Past Surgical History  Procedure Laterality Date  . Tubal ligation      bilateral  . Hand surgery      for arthritis  . Tonsillectomy    . Left knee surgery nodule removal    . Other surgical history      multiple nodule removal on neck, hands, left knee  . Dilation and curettage of uterus     Family History  Problem Relation Age of Onset  . Diabetes Mother   . Cancer Mother     breast  . Diabetes Sister   . Cancer Brother     bone marrow cancer   History  Substance Use Topics  . Smoking status: Former Smoker -- 0.25 packs/day for 6 years    Types: Cigarettes    Quit date: 06/09/2013  . Smokeless tobacco: Never Used  . Alcohol  Use: No   OB History   Grav Para Term Preterm Abortions TAB SAB Ect Mult Living   3 2 2  0 1 0 1 0 0 2     Review of Systems  Constitutional: Negative for fever and chills.  Musculoskeletal: Positive for arthralgias and joint swelling.  Skin: Positive for color change and wound.  Neurological: Negative for weakness and numbness.  All other systems reviewed and are negative.   Allergies  Bactrim; Cefuroxime axetil; Cephalosporins; Lisinopril; Penicillins; Sulfa antibiotics; and Iohexol  Home Medications   Prior to Admission medications   Medication Sig Start Date End Date Taking? Authorizing Provider  abatacept (ORENCIA) 250 MG injection Inject 1,000 mg into the vein every 28 (twenty-eight) days. Monthly Infusion   Yes Historical Provider, MD  amitriptyline (ELAVIL) 25 MG tablet Take 25 mg by mouth daily.    Yes Historical Provider, MD  baclofen (LIORESAL) 10 MG tablet Take 10 mg by mouth See admin instructions. May take 10 mg twice weekly if needed for headaches   Yes Historical Provider, MD  chlorthalidone (HYGROTON) 25 MG tablet Take 25 mg by mouth daily.   Yes Historical Provider, MD  diltiazem (CARDIZEM CD)  240 MG 24 hr capsule Take 240 mg by mouth daily.  02/03/14  Yes Historical Provider, MD  fluticasone (FLONASE) 50 MCG/ACT nasal spray Place 1 spray into both nostrils daily as needed for allergies or rhinitis.   Yes Historical Provider, MD  furosemide (LASIX) 20 MG tablet Take 0.5 tablets (10 mg total) by mouth daily. 03/01/14 03/01/15 Yes Sahar Alene Mires, PA-C  gabapentin (NEURONTIN) 300 MG capsule Take 600 mg by mouth 3 (three) times daily.    Yes Historical Provider, MD  HYDROcodone-acetaminophen (NORCO/VICODIN) 5-325 MG per tablet Take 1 tablet by mouth every 6 (six) hours as needed (pain).  02/21/14  Yes Historical Provider, MD  insulin lispro (HUMALOG) 100 UNIT/ML injection Inject 40 Units into the skin 3 (three) times daily before meals. Per sliding scale   Yes Historical  Provider, MD  insulin NPH (HUMULIN N,NOVOLIN N) 100 UNIT/ML injection Inject 75 Units into the skin 3 (three) times daily with meals. 75 units with breakfast; lunch 75 units; 75 units at dinner   Yes Historical Provider, MD  irbesartan (AVAPRO) 300 MG tablet Take 300 mg by mouth daily.    Yes Historical Provider, MD  Methotrexate Sodium, PF, 50 MG/2ML SOLN Inject 20 mg into the skin once a week. Takes on Sundays   Yes Historical Provider, MD  Nebivolol HCl (BYSTOLIC) 20 MG TABS Take 20 mg by mouth daily. 02/13/12  Yes Historical Provider, MD  pantoprazole (PROTONIX) 40 MG tablet Take 40 mg by mouth daily.   Yes Historical Provider, MD  predniSONE (DELTASONE) 5 MG tablet Take 5 mg by mouth daily.   Yes Historical Provider, MD  rosuvastatin (CRESTOR) 10 MG tablet Take 10 mg by mouth daily.   Yes Historical Provider, MD  topiramate (TOPAMAX) 25 MG tablet Take 25 mg by mouth at bedtime. Migraine preventative   Yes Historical Provider, MD  VOLTAREN 1 % GEL Apply 1 application topically 3 (three) times daily.  02/03/14  Yes Historical Provider, MD  BD INSULIN SYRINGE ULTRAFINE 31G X 15/64" 1 ML MISC  01/03/14   Historical Provider, MD  tiZANidine (ZANAFLEX) 4 MG tablet Take 4 mg by mouth 2 (two) times daily as needed (migraines). Limit 2 days per week 01/12/14   Historical Provider, MD   LMP 12/27/2013  Physical Exam  Nursing note and vitals reviewed. Constitutional: She appears well-developed and well-nourished. No distress.  Eyes: Conjunctivae are normal.  Neck: Neck supple.  Musculoskeletal: She exhibits edema.  Swelling and bruising noted to the right great toe. Tender to palpation overp and distal phalanx of the right great toe. There is a large blisters to the dorsal aspect of the toe with clear drainage. Cap refill is less than 2 seconds distally. No erythema, no erythema extending to the foot. There is bilateral lower extremity pitting edema, 2+.  Neurological: She is alert.  Skin: Skin is warm  and dry.    ED Course  Procedures (including critical care time) DIAGNOSTIC STUDIES:  COORDINATION OF CARE: 10:40 PM-Discussed treatment plan which includes x-ray of right foot with pt at bedside and pt agreed to plan.     Labs Review Labs Reviewed  CBG MONITORING, ED    Imaging Review Dg Foot Complete Right  03/20/2014   CLINICAL DATA:  [place] up stairs 8 days ago, hitting right toe on step. Right first toe pain, swelling and bruising. Blister between the first metatarsophalangeal and interphalangeal joints popped today, and is draining clear fluid. Initial encounter.  EXAM: RIGHT FOOT COMPLETE - 3+  VIEW  COMPARISON:  None.  FINDINGS: There is a mildly displaced mildly comminuted fracture involving the distal aspect of the first proximal phalanx, both medially and laterally. There is also a small avulsion fracture at the lateral aspect of the base of the first distal phalanx. Overlying soft swelling is noted.  There is no evidence of talar subluxation; mild degenerative change is noted at the subtalar joint, with pes planus noted. Mild diffuse soft tissue swelling is seen about the forefoot.  IMPRESSION: 1. Mildly displaced mildly comminuted fracture involving the distal aspect of the first proximal phalanx, both medially and laterally, extending to the first interphalangeal joint. 2. Small avulsion fracture at the lateral aspect of the base of the first distal phalanx. 3. Pes planus noted.   Electronically Signed   By: Garald Balding M.D.   On: 03/20/2014 00:48     EKG Interpretation None      MDM   Final diagnoses:  Fractured great toe, right, closed, initial encounter    Patient with injury to her right great toe 8 days ago. Here with swelling, pain, blistering of the toe. Toe is hyperpigmented, most likely bruised, tender to palpation, painful with movement, a large posterior to the dorsal aspect. Discussed with Dr. Sabra Heck, given some concern about infection, who came and saw the  patient. No concern about infection at this time. Blisters draining clear fluid. X-ray obtained   X-ray showing multiple fractures in the toe as described above. Postop shoe provided, crutches, follow with orthopedics Dr. Aletha Halim start on doxycycline to prevent infection. norco for pain.   Filed Vitals:   03/20/14 0105  BP: 133/76  Pulse: 88  Temp: 98.1 F (36.7 C)  TempSrc: Oral  Resp: 20  SpO2: 96%    I personally performed the services described in this documentation, which was scribed in my presence. The recorded information has been reviewed and is accurate.     Renold Genta, PA-C 03/20/14 0151

## 2014-03-19 NOTE — ED Notes (Signed)
CBG is 155

## 2014-03-19 NOTE — ED Notes (Signed)
Pt. reports blister at right great toe with drainage onset last Friday injured from a fall .

## 2014-03-19 NOTE — ED Notes (Signed)
Tatyana, PA, at the bedside.

## 2014-03-20 ENCOUNTER — Emergency Department (HOSPITAL_COMMUNITY): Payer: Medicare Other

## 2014-03-20 DIAGNOSIS — S92421A Displaced fracture of distal phalanx of right great toe, initial encounter for closed fracture: Secondary | ICD-10-CM | POA: Diagnosis not present

## 2014-03-20 DIAGNOSIS — S92401A Displaced unspecified fracture of right great toe, initial encounter for closed fracture: Secondary | ICD-10-CM | POA: Diagnosis not present

## 2014-03-20 MED ORDER — OXYCODONE-ACETAMINOPHEN 5-325 MG PO TABS
1.0000 | ORAL_TABLET | Freq: Once | ORAL | Status: AC
  Administered 2014-03-20: 1 via ORAL
  Filled 2014-03-20: qty 1

## 2014-03-20 MED ORDER — DOXYCYCLINE HYCLATE 100 MG PO CAPS: 100.0000 mg | ORAL_CAPSULE | Freq: Two times a day (BID) | ORAL | Status: DC

## 2014-03-20 MED ORDER — HYDROCODONE-ACETAMINOPHEN 5-325 MG PO TABS: 1.0000 | ORAL_TABLET | Freq: Four times a day (QID) | ORAL | Status: DC | PRN

## 2014-03-20 NOTE — Progress Notes (Signed)
Orthopedic Tech Progress Note Patient Details:  Jessica Howard [birth year] [id number]  Ortho Devices Type of Ortho Device: Crutches;Postop shoe/boot Ortho Device/Splint Interventions: Application   Katheren Shams 03/20/2014, 1:11 AM

## 2014-03-20 NOTE — ED Notes (Signed)
Gave patient ice as requested.

## 2014-03-20 NOTE — Discharge Instructions (Signed)
[place] is broken. Use crutches and postop shoe when ambulating. Ibuprofen or Tylenol for pain. Percocet for severe pain. Take doxycycline as prescribed until all gone to prevent infection. Keep the toe clean. He can apply topical antibiotic ointment to the blister. Followup with orthopedic doctor next week   Toe Fracture Your caregiver has diagnosed you as having a fractured toe. A toe fracture is a break in the bone of a toe. "Buddy taping" is a way of splinting your broken toe, by taping the broken toe to the toe next to it. This "buddy taping" will keep the injured toe from moving beyond normal range of motion. Buddy taping also helps the toe heal in a more normal alignment. It may take 6 to 8 weeks for the toe injury to heal. [place] your toes taped together for as long as directed by your caregiver or until you see a doctor for a follow-up examination. You can change the tape after bathing. Always use a small piece of gauze or cotton between the toes when taping them together. This will help the skin stay dry and prevent infection.  Apply ice to the injury for 15-20 minutes each hour while awake for the first 2 days. Put the ice in a plastic bag and place a towel between the bag of ice and your skin.  After the first 2 days, apply heat to the injured area. Use heat for the next 2 to 3 days. Place a heating pad on the foot or soak the foot in warm water as directed by your caregiver.  Keep your foot elevated as much as possible to lessen swelling.  Wear sturdy, supportive shoes. The shoes should not pinch the toes or fit tightly against the toes.  Your caregiver may prescribe a rigid shoe if your foot is very swollen.  Your may be given crutches if the pain is too great and it hurts too much to walk.  Only take over-the-counter or prescription medicines for pain, discomfort, or fever as directed by your caregiver.  If your caregiver has given you a follow-up  appointment, it is very important to keep that appointment. Not keeping the appointment could result in a chronic or permanent injury, pain, and disability. If there is any problem keeping the appointment, you must call back to this facility for assistance. SEEK MEDICAL CARE IF:   You have increased pain or swelling, not relieved with medications.  The pain does not get better after 1 week.  Your injured toe is cold when the others are warm. SEEK IMMEDIATE MEDICAL CARE IF:   The toe becomes cold, numb, or white.  The toe becomes hot (inflamed) and red. Document Released: 05/24/2000 Document Revised: 08/19/2011 Document Reviewed: 01/11/2008 [facility] Patient Information 2015 [place], [place]. This information is not intended to replace advice given to you by your health care provider. Make sure you discuss any questions you have with your health care provider.

## 2014-03-20 NOTE — ED Notes (Signed)
Ortho tech is at the bedside now.

## 2014-03-20 NOTE — ED Notes (Signed)
Ortho tech paged. Patient is 5'3.

## 2014-03-21 NOTE — ED Provider Notes (Signed)
Medical screening examination/treatment/procedure(s) were performed by non-physician practitioner and as supervising physician I was immediately available for consultation/collaboration.   Leota Jacobsen, MD 03/21/14 [phone]

## 2014-03-23 DIAGNOSIS — S90421A Blister (nonthermal), right great toe, initial encounter: Secondary | ICD-10-CM | POA: Diagnosis not present

## 2014-03-23 DIAGNOSIS — S92411A Displaced fracture of proximal phalanx of right great toe, initial encounter for closed fracture: Secondary | ICD-10-CM | POA: Diagnosis not present

## 2014-03-23 DIAGNOSIS — M79674 Pain in right toe(s): Secondary | ICD-10-CM | POA: Diagnosis not present

## 2014-03-29 ENCOUNTER — Telehealth (HOSPITAL_COMMUNITY): Payer: Self-pay

## 2014-03-29 NOTE — Telephone Encounter (Signed)
Encounter complete. 

## 2014-03-31 ENCOUNTER — Ambulatory Visit (HOSPITAL_COMMUNITY)
Admission: RE | Admit: 2014-03-31 | Discharge: 2014-03-31 | Disposition: A | Discharge status: Home / Self Care | Payer: Medicare Other | Source: Ambulatory Visit | Attending: Cardiology | Admitting: Cardiology

## 2014-03-31 DIAGNOSIS — E785 Hyperlipidemia, unspecified: Secondary | ICD-10-CM | POA: Insufficient documentation

## 2014-03-31 DIAGNOSIS — R0602 Shortness of breath: Secondary | ICD-10-CM

## 2014-03-31 DIAGNOSIS — Z87891 Personal history of nicotine dependence: Secondary | ICD-10-CM | POA: Insufficient documentation

## 2014-03-31 DIAGNOSIS — R0609 Other forms of dyspnea: Secondary | ICD-10-CM | POA: Insufficient documentation

## 2014-03-31 DIAGNOSIS — I10 Essential (primary) hypertension: Secondary | ICD-10-CM | POA: Insufficient documentation

## 2014-03-31 DIAGNOSIS — E119 Type 2 diabetes mellitus without complications: Secondary | ICD-10-CM | POA: Diagnosis not present

## 2014-03-31 DIAGNOSIS — R079 Chest pain, unspecified: Secondary | ICD-10-CM | POA: Diagnosis not present

## 2014-03-31 DIAGNOSIS — I5032 Chronic diastolic (congestive) heart failure: Secondary | ICD-10-CM | POA: Diagnosis not present

## 2014-03-31 MED ORDER — REGADENOSON 0.4 MG/5ML IV SOLN
0.4000 mg | Freq: Once | INTRAVENOUS | Status: AC
  Administered 2014-03-31: 0.4 mg via INTRAVENOUS

## 2014-03-31 MED ORDER — AMINOPHYLLINE 25 MG/ML IV SOLN
100.0000 mg | Freq: Once | INTRAVENOUS | Status: AC
  Administered 2014-03-31: 100 mg via INTRAVENOUS

## 2014-03-31 MED ORDER — TECHNETIUM TC 99M SESTAMIBI GENERIC - CARDIOLITE
30.0000 | Freq: Once | INTRAVENOUS | Status: AC | PRN
  Administered 2014-03-31: 30 via INTRAVENOUS

## 2014-03-31 NOTE — Procedures (Addendum)
[facility] [address] [place] [address] [phone]  Cardiology Nuclear Med Study  Jessica Howard is a 53 y.o. female     MRN : [id number]     DOB: [birth year]  Procedure Date: 03/31/2014  Nuclear Med Background Indication for Stress Test:  Evaluation for Ischemia and Post Hospital History:  Chronic Diastolic CHF Cardiac Risk Factors: History of Smoking, Hypertension, Lipids and NIDDM  Symptoms:  Chest Pain and DOE   Nuclear Pre-Procedure Caffeine/Decaff Intake:  None NPO After: 12:00am   IV Site: R Hand  IV 0.9% NS with Angio Cath:  22g  Chest Size (in):  46 IV Started BD:ZHGDJM Smith, RN  Height: 5\' 3"  (1.6 m)  Cup Size: DD  BMI:  Body mass index is 51.56 kg/(m^2). Weight:  291 lb (131.997 kg)   Tech Comments:  N/A    Nuclear Med Study 1 or 2 day study: 2 day  Stress Test Type:  [place]  Order Authorizing Provider:  Minus Breeding, MD   Resting Radionuclide: Technetium 62m Sestamibi  Resting Radionuclide Dose: 30.9 mCi   Stress Radionuclide:  Technetium 68m Sestamibi  Stress Radionuclide Dose: 30.1 mCi           Stress Protocol Rest HR:75 Stress HR:90  Rest BP: 132/71 Stress BP: 136/60  Exercise Time (min): n/a METS: n/a          Dose of Adenosine (mg):  n/a Dose of Lexiscan: 0.4 mg  Dose of Atropine (mg): n/a Dose of Dobutamine: n/a mcg/kg/min (at max HR)  Stress Test Technologist: Mellody Memos, CCT Nuclear Technologist: Imagene Riches, CNMT   Rest Procedure:  Myocardial perfusion imaging was performed at rest 45 minutes following the intravenous administration of Technetium 49m Sestamibi. Stress Procedure:  The patient received IV Lexiscan 0.4 mg over 15-seconds.  Technetium 39m Sestamibi injected IV at 30-seconds.  Patient experienced shortness of breath and was administered 150 mg of Aminophylline IV at 5 minutes. There were no significant changes with Lexiscan.  Quantitative spect images were  obtained after a 45 minute delay.  Transient Ischemic Dilatation (Normal <1.22):  0.61  QGS EDV:  52 ml QGS ESV:  16 ml LV Ejection Fraction: 70%        Rest ECG: NSR - Normal EKG  Stress ECG: No significant change from baseline ECG  QPS Raw Data Images:  There is interference from nuclear activity from structures below the diaphragm. This does not affect the ability to read the study. Stress Images:  There is decreased uptake in the inferior wall. Rest Images:  There is decreased uptake in the inferior wall. Subtraction (SDS):  There is a fixed inferior defect that is most consistent with diaphragmatic attenuation.  Impression Exercise Capacity:  Lexiscan with no exercise. BP Response:  Normal blood pressure response. Clinical Symptoms:  No significant symptoms noted. ECG Impression:  No significant ST segment change suggestive of ischemia. Comparison with Prior Nuclear Study: No images to compare  Overall Impression:  Low risk stress nuclear study Diapgragmatic attenuation with increased gut uptake of tracer.  LV Wall Motion:  NL LV Function; NL Wall Motion   Lorretta Harp, MD  04/01/2014 12:58 PM

## 2014-04-01 ENCOUNTER — Ambulatory Visit (HOSPITAL_COMMUNITY)
Admission: RE | Admit: 2014-04-01 | Discharge: 2014-04-01 | Disposition: A | Discharge status: Home / Self Care | Payer: Medicare Other | Source: Ambulatory Visit | Attending: Cardiovascular Disease | Admitting: Cardiovascular Disease

## 2014-04-01 DIAGNOSIS — R079 Chest pain, unspecified: Secondary | ICD-10-CM

## 2014-04-01 DIAGNOSIS — Z87891 Personal history of nicotine dependence: Secondary | ICD-10-CM | POA: Insufficient documentation

## 2014-04-01 DIAGNOSIS — R0602 Shortness of breath: Secondary | ICD-10-CM

## 2014-04-01 DIAGNOSIS — R0609 Other forms of dyspnea: Secondary | ICD-10-CM | POA: Diagnosis not present

## 2014-04-01 DIAGNOSIS — E119 Type 2 diabetes mellitus without complications: Secondary | ICD-10-CM | POA: Insufficient documentation

## 2014-04-01 DIAGNOSIS — I10 Essential (primary) hypertension: Secondary | ICD-10-CM | POA: Insufficient documentation

## 2014-04-01 DIAGNOSIS — E785 Hyperlipidemia, unspecified: Secondary | ICD-10-CM | POA: Diagnosis not present

## 2014-04-01 MED ORDER — TECHNETIUM TC 99M SESTAMIBI GENERIC - CARDIOLITE
30.9000 | Freq: Once | INTRAVENOUS | Status: AC | PRN
  Administered 2014-04-01: 30.9 via INTRAVENOUS

## 2014-04-04 DIAGNOSIS — R600 Localized edema: Secondary | ICD-10-CM | POA: Diagnosis not present

## 2014-04-04 DIAGNOSIS — M545 Low back pain: Secondary | ICD-10-CM | POA: Diagnosis not present

## 2014-04-04 DIAGNOSIS — I509 Heart failure, unspecified: Secondary | ICD-10-CM | POA: Diagnosis not present

## 2014-04-07 DIAGNOSIS — M5136 Other intervertebral disc degeneration, lumbar region: Secondary | ICD-10-CM | POA: Diagnosis not present

## 2014-04-07 DIAGNOSIS — M150 Primary generalized (osteo)arthritis: Secondary | ICD-10-CM | POA: Diagnosis not present

## 2014-04-07 DIAGNOSIS — M0589 Other rheumatoid arthritis with rheumatoid factor of multiple sites: Secondary | ICD-10-CM | POA: Diagnosis not present

## 2014-04-07 DIAGNOSIS — M797 Fibromyalgia: Secondary | ICD-10-CM | POA: Diagnosis not present

## 2014-04-07 NOTE — Progress Notes (Signed)
Pt. Called no answer, LMTCB 

## 2014-04-11 ENCOUNTER — Encounter (HOSPITAL_COMMUNITY): Payer: Self-pay | Admitting: Emergency Medicine

## 2014-04-12 DIAGNOSIS — S46011A Strain of muscle(s) and tendon(s) of the rotator cuff of right shoulder, initial encounter: Secondary | ICD-10-CM | POA: Diagnosis not present

## 2014-04-13 DIAGNOSIS — M79674 Pain in right toe(s): Secondary | ICD-10-CM | POA: Diagnosis not present

## 2014-04-13 DIAGNOSIS — S92411S Displaced fracture of proximal phalanx of right great toe, sequela: Secondary | ICD-10-CM | POA: Diagnosis not present

## 2014-04-13 DIAGNOSIS — S90421D Blister (nonthermal), right great toe, subsequent encounter: Secondary | ICD-10-CM | POA: Diagnosis not present

## 2014-04-18 DIAGNOSIS — G43719 Chronic migraine without aura, intractable, without status migrainosus: Secondary | ICD-10-CM | POA: Diagnosis not present

## 2014-04-20 ENCOUNTER — Ambulatory Visit (HOSPITAL_BASED_OUTPATIENT_CLINIC_OR_DEPARTMENT_OTHER): Payer: Medicare Other | Admitting: Oncology

## 2014-04-20 ENCOUNTER — Other Ambulatory Visit (HOSPITAL_BASED_OUTPATIENT_CLINIC_OR_DEPARTMENT_OTHER): Payer: Medicare Other

## 2014-04-20 ENCOUNTER — Telehealth: Payer: Self-pay | Admitting: Medical Oncology

## 2014-04-20 ENCOUNTER — Telehealth: Payer: Self-pay | Admitting: Oncology

## 2014-04-20 VITALS — BP 100/53 | HR 93 | Temp 98.7°F | Resp 18 | Ht 63.0 in | Wt 288.9 lb

## 2014-04-20 DIAGNOSIS — M5136 Other intervertebral disc degeneration, lumbar region: Secondary | ICD-10-CM | POA: Diagnosis not present

## 2014-04-20 DIAGNOSIS — D638 Anemia in other chronic diseases classified elsewhere: Secondary | ICD-10-CM | POA: Diagnosis not present

## 2014-04-20 DIAGNOSIS — D508 Other iron deficiency anemias: Secondary | ICD-10-CM | POA: Diagnosis not present

## 2014-04-20 DIAGNOSIS — D569 Thalassemia, unspecified: Secondary | ICD-10-CM | POA: Diagnosis not present

## 2014-04-20 DIAGNOSIS — D539 Nutritional anemia, unspecified: Secondary | ICD-10-CM

## 2014-04-20 LAB — IRON AND TIBC CHCC
%SAT: 47 % (ref 21–57)
Iron: 104 ug/dL (ref 41–142)
TIBC: 220 ug/dL — AB (ref 236–444)
UIBC: 116 ug/dL — ABNORMAL LOW (ref 120–384)

## 2014-04-20 LAB — CBC WITH DIFFERENTIAL/PLATELET
BASO%: 0.2 % (ref 0.0–2.0)
Basophils Absolute: 0 10*3/uL (ref 0.0–0.1)
EOS ABS: 0.1 10*3/uL (ref 0.0–0.5)
EOS%: 1.1 % (ref 0.0–7.0)
HEMATOCRIT: 28.1 % — AB (ref 34.8–46.6)
HGB: 8.8 g/dL — ABNORMAL LOW (ref 11.6–15.9)
LYMPH%: 38.2 % (ref 14.0–49.7)
MCH: 21.2 pg — ABNORMAL LOW (ref 25.1–34.0)
MCHC: 31.3 g/dL — ABNORMAL LOW (ref 31.5–36.0)
MCV: 67.5 fL — AB (ref 79.5–101.0)
MONO#: 0.8 10*3/uL (ref 0.1–0.9)
MONO%: 8.2 % (ref 0.0–14.0)
NEUT#: 4.9 10*3/uL (ref 1.5–6.5)
NEUT%: 52.3 % (ref 38.4–76.8)
PLATELETS: 227 10*3/uL (ref 145–400)
RBC: 4.16 10*6/uL (ref 3.70–5.45)
RDW: 17.6 % — ABNORMAL HIGH (ref 11.2–14.5)
WBC: 9.4 10*3/uL (ref 3.9–10.3)
lymph#: 3.6 10*3/uL — ABNORMAL HIGH (ref 0.9–3.3)

## 2014-04-20 LAB — COMPREHENSIVE METABOLIC PANEL (CC13)
ALT: 20 U/L (ref 0–55)
ANION GAP: 10 meq/L (ref 3–11)
AST: 12 U/L (ref 5–34)
Albumin: 3.3 g/dL — ABNORMAL LOW (ref 3.5–5.0)
Alkaline Phosphatase: 81 U/L (ref 40–150)
BILIRUBIN TOTAL: 0.35 mg/dL (ref 0.20–1.20)
BUN: 23.2 mg/dL (ref 7.0–26.0)
CO2: 23 meq/L (ref 22–29)
CREATININE: 1.6 mg/dL — AB (ref 0.6–1.1)
Calcium: 8.9 mg/dL (ref 8.4–10.4)
Chloride: 104 mEq/L (ref 98–109)
GLUCOSE: 568 mg/dL — AB (ref 70–140)
Potassium: 4 mEq/L (ref 3.5–5.1)
Sodium: 137 mEq/L (ref 136–145)
TOTAL PROTEIN: 7 g/dL (ref 6.4–8.3)

## 2014-04-20 LAB — FERRITIN CHCC: Ferritin: 374 ng/ml — ABNORMAL HIGH (ref 9–269)

## 2014-04-20 NOTE — Telephone Encounter (Signed)
Gave avs & cal. °

## 2014-04-20 NOTE — Telephone Encounter (Signed)
Patients glucose this morning @ 568, call to patient to inform her. Patient states she did not take her diabetic medication this morning or test her FSBS, states she was running late. Patient confirms she will check her blood sugar when she gets home now and will take her medication. Encouraged patient to follow up with her PCP as well. Patient expressed thanks, knows to f/u with her PCP. Denies further questions at this time.  MD aware of results.

## 2014-04-20 NOTE — Progress Notes (Signed)
Hematology and Oncology Follow Up Visit  Jessica Howard [id number] May 10, 1961 53 y.o. 04/20/2014 9:44 AM  CC: Jessica Howard, M.D.  [name], M.D.  Principle Diagnosis: This is a 53 year old female with microcytic anemia due to beta thalassemia.  She has element of iron deficiency due to heavy menstrual bleeding. She also has element of anemia of chronic disease with history of rheumatoid arthritis.  Interim History:  Jessica Howard presents today for a follow-up visit. Since her last visit, she received IV iron with some minor infusion reaction. She was hospitalized for chest pain briefly and was discharged on 03/01/2014. Since her discharge, she has not reported any chest pain but does report occasional shortness of breath. She noticed some increase in her fatigue levels and lack of energy. She was also noted to have some exertional dyspnea as well.  She does have diffuse pain all over, again related to her rheumatoid arthritis.  She is still continuing to have heavy menstrual cycles lasting five days with clots at times.   She did not report headaches or blurry vision or syncope. She did not report any chest pain or palpitation. Does not report any frequency urgency or hesitancy. She does not report any hematuria hematochezia or melena. Rest of her review of systems unremarkable.   Medications: I have reviewed the patient's current medications.  Current Outpatient Prescriptions  Medication Sig Dispense Refill  . abatacept (ORENCIA) 250 MG injection Inject 1,000 mg into the vein every 28 (twenty-eight) days. Monthly Infusion    . amitriptyline (ELAVIL) 25 MG tablet Take 25 mg by mouth daily.     . baclofen (LIORESAL) 10 MG tablet Take 10 mg by mouth See admin instructions. May take 10 mg twice weekly if needed for headaches    . BD INSULIN SYRINGE ULTRAFINE 31G X 15/64" 1 ML MISC     . BYSTOLIC 10 MG tablet     . chlorthalidone (HYGROTON) 25 MG tablet Take 25 mg by mouth daily.     . CRESTOR 20 MG tablet     . diltiazem (CARDIZEM CD) 240 MG 24 hr capsule Take 240 mg by mouth daily.     Marland Kitchen doxycycline (VIBRAMYCIN) 100 MG capsule Take 1 capsule (100 mg total) by mouth 2 (two) times daily. 20 capsule 0  . EDECRIN 25 MG tablet   3  . fluticasone (FLONASE) 50 MCG/ACT nasal spray Place 1 spray into both nostrils daily as needed for allergies or rhinitis.    . furosemide (LASIX) 20 MG tablet Take 0.5 tablets (10 mg total) by mouth daily. 15 tablet 0  . gabapentin (NEURONTIN) 300 MG capsule Take 600 mg by mouth 3 (three) times daily.     Marland Kitchen HYDROcodone-acetaminophen (NORCO) 5-325 MG per tablet Take 1 tablet by mouth every 6 (six) hours as needed for moderate pain. 20 tablet 0  . HYDROcodone-acetaminophen (NORCO/VICODIN) 5-325 MG per tablet Take 1 tablet by mouth every 6 (six) hours as needed (pain).     . insulin lispro (HUMALOG) 100 UNIT/ML injection Inject 40 Units into the skin 3 (three) times daily before meals. Per sliding scale    . insulin NPH (HUMULIN N,NOVOLIN N) 100 UNIT/ML injection Inject 75 Units into the skin 3 (three) times daily with meals. 75 units with breakfast; lunch 75 units; 75 units at dinner    . irbesartan (AVAPRO) 300 MG tablet Take 300 mg by mouth daily.     Marland Kitchen LOTEMAX 0.5 % GEL   0  . Methotrexate  Sodium, PF, 50 MG/2ML SOLN Inject 20 mg into the skin once a week. Takes on Sundays    . Nebivolol HCl (BYSTOLIC) 20 MG TABS Take 20 mg by mouth daily.    [name] VERIO test strip   5  . pantoprazole (PROTONIX) 40 MG tablet Take 40 mg by mouth daily.    . predniSONE (DELTASONE) 5 MG tablet Take 5 mg by mouth daily.    . rosuvastatin (CRESTOR) 10 MG tablet Take 10 mg by mouth daily.    Marland Kitchen tiZANidine (ZANAFLEX) 4 MG tablet Take 4 mg by mouth 2 (two) times daily as needed (migraines). Limit 2 days per week    . topiramate (TOPAMAX) 100 MG tablet     . topiramate (TOPAMAX) 25 MG tablet Take 25 mg by mouth at bedtime. Migraine preventative    . VOLTAREN 1 % GEL  Apply 1 application topically 3 (three) times daily.      No current facility-administered medications for this visit.    Allergies:  Allergies  Allergen Reactions  . Bactrim [Sulfamethoxazole-Trimethoprim] Hives  . Cefuroxime Axetil Itching  . Cephalosporins Itching  . Lisinopril Cough  . Penicillins Hives  . Sulfa Antibiotics Hives  . Iohexol Itching and Rash     Code: RASH, Desc: HAD ITCHING AND A RASH ABOUT ONE HOUR AFTER RETURNING HOME FROM THE CT, Onset Date: [id number]     Past Medical History, Surgical history, Social history, and Family History were reviewed and updated.   Blood pressure 100/53, pulse 93, temperature 98.7 F (37.1 C), temperature source Oral, resp. rate 18, height 5\' 3"  (1.6 m), weight 288 lb 14.4 oz (131.044 kg), last menstrual period 12/27/2013. ECOG: 1 General appearance: alert is not in any distress. Head: Normocephalic, without obvious abnormality Neck: no adenopathy Lymph nodes: Cervical, supraclavicular, and axillary nodes normal. Heart:regular rate and rhythm, S1, S2 normal, no murmur, click, rub or gallop Lung:chest clear, no wheezing, rales, normal symmetric air entry Abdomin: soft, non-tender, without masses or organomegaly EXT:no erythema, induration, or nodules   CBC    Component Value Date/Time   WBC 9.4 04/20/2014 0852   WBC 12.7* 02/27/2014 2001   RBC 4.16 04/20/2014 0852   RBC 4.42 02/27/2014 2001   HGB 8.8* 04/20/2014 0852   HGB 9.4* 02/27/2014 2001   HCT 28.1* 04/20/2014 0852   HCT 29.2* 02/27/2014 2001   PLT 227 04/20/2014 0852   PLT 334 02/27/2014 2001   MCV 67.5* 04/20/2014 0852   MCV 66.1* 02/27/2014 2001   MCH 21.2* 04/20/2014 0852   MCH 21.3* 02/27/2014 2001   MCHC 31.3* 04/20/2014 0852   MCHC 32.2 02/27/2014 2001   RDW 17.6* 04/20/2014 0852   RDW 16.6* 02/27/2014 2001   LYMPHSABS 3.6* 04/20/2014 0852   LYMPHSABS 2.5 08/26/2009 2310   MONOABS 0.8 04/20/2014 0852   MONOABS 0.7 08/26/2009 2310   EOSABS 0.1  04/20/2014 0852   EOSABS 0.1 08/26/2009 2310   BASOSABS 0.0 04/20/2014 0852   BASOSABS 0.0 08/26/2009 2310       Impression and Plan:  A 53 year old female with the following issues: 1. Microcytic anemia related to her beta thalassemia as well as iron deficiency. She appears to have an element of anemia of chronic disease as well. She is status post IV iron in September 2015 with her hemoglobin around 8.8 and not fully responsive. Her iron stores are fully replete at this time. Options of treatments were discussed today including growth factor support. Risks and benefits of Aranesp was  discussed. Complications include injection-related complication, hypertension among others were discussed. She would like to think about that and potentially started in the near future. Written information was given to the patient I also scheduled her tentatively for infusions every 3 weeks for a and Aranesp 300 g every 3 weeks. 2. Followup will be in 2 months to assess response to Aranesp.Zola Button, MD 11/11/20159:44 AM

## 2014-04-20 NOTE — Telephone Encounter (Signed)
Patient called to inform office that her blood sugar has come down to 200 and verifies that she has taken her medication. She thanked [place] for checking on her.

## 2014-04-21 DIAGNOSIS — M19011 Primary osteoarthritis, right shoulder: Secondary | ICD-10-CM | POA: Diagnosis not present

## 2014-04-27 ENCOUNTER — Ambulatory Visit: Payer: Medicare Other

## 2014-04-29 ENCOUNTER — Ambulatory Visit (INDEPENDENT_AMBULATORY_CARE_PROVIDER_SITE_OTHER): Payer: Medicare Other | Admitting: Ophthalmology

## 2014-05-02 ENCOUNTER — Encounter (HOSPITAL_COMMUNITY): Payer: Medicare Other

## 2014-05-02 ENCOUNTER — Ambulatory Visit (INDEPENDENT_AMBULATORY_CARE_PROVIDER_SITE_OTHER): Payer: Medicare Other | Admitting: Ophthalmology

## 2014-05-02 DIAGNOSIS — H2513 Age-related nuclear cataract, bilateral: Secondary | ICD-10-CM | POA: Diagnosis not present

## 2014-05-02 DIAGNOSIS — H43813 Vitreous degeneration, bilateral: Secondary | ICD-10-CM | POA: Diagnosis not present

## 2014-05-02 DIAGNOSIS — E11339 Type 2 diabetes mellitus with moderate nonproliferative diabetic retinopathy without macular edema: Secondary | ICD-10-CM

## 2014-05-02 DIAGNOSIS — I10 Essential (primary) hypertension: Secondary | ICD-10-CM

## 2014-05-02 DIAGNOSIS — E11311 Type 2 diabetes mellitus with unspecified diabetic retinopathy with macular edema: Secondary | ICD-10-CM | POA: Diagnosis not present

## 2014-05-02 DIAGNOSIS — H35033 Hypertensive retinopathy, bilateral: Secondary | ICD-10-CM | POA: Diagnosis not present

## 2014-05-02 DIAGNOSIS — E11331 Type 2 diabetes mellitus with moderate nonproliferative diabetic retinopathy with macular edema: Secondary | ICD-10-CM | POA: Diagnosis not present

## 2014-05-03 ENCOUNTER — Other Ambulatory Visit (HOSPITAL_COMMUNITY): Payer: Self-pay | Admitting: *Deleted

## 2014-05-03 ENCOUNTER — Encounter (HOSPITAL_COMMUNITY): Payer: Medicare Other

## 2014-05-04 ENCOUNTER — Encounter (HOSPITAL_COMMUNITY)
Admission: RE | Admit: 2014-05-04 | Discharge: 2014-05-04 | Disposition: A | Discharge status: Home / Self Care | Payer: Medicare Other | Source: Ambulatory Visit | Attending: Internal Medicine | Admitting: Internal Medicine

## 2014-05-04 ENCOUNTER — Telehealth: Payer: Self-pay | Admitting: Medical Oncology

## 2014-05-04 DIAGNOSIS — M0589 Other rheumatoid arthritis with rheumatoid factor of multiple sites: Secondary | ICD-10-CM | POA: Insufficient documentation

## 2014-05-04 MED ORDER — SODIUM CHLORIDE 0.9 % IV SOLN
INTRAVENOUS | Status: DC
  Administered 2014-05-04: 12:00:00 via INTRAVENOUS

## 2014-05-04 MED ORDER — ABATACEPT 250 MG IV SOLR
1000.0000 mg | INTRAVENOUS | Status: DC
  Administered 2014-05-04: 1000 mg via INTRAVENOUS
  Filled 2014-05-04: qty 40

## 2014-05-04 NOTE — Telephone Encounter (Signed)
Was informed pt called wanting to r/s missed aranesp injection appt from 11/18. Return call to patient and [facility] informing her to expect call from scheduling to r/s missed appts.  Mssg given to Audie Clear in scheduling.

## 2014-05-06 ENCOUNTER — Telehealth: Payer: Self-pay | Admitting: Oncology

## 2014-05-06 NOTE — Telephone Encounter (Signed)
pt has broke toe and wanted to r/s appt due to another appt.....done...pt aware of new d.t

## 2014-05-09 ENCOUNTER — Encounter (HOSPITAL_COMMUNITY): Payer: Medicare Other

## 2014-05-09 DIAGNOSIS — R809 Proteinuria, unspecified: Secondary | ICD-10-CM | POA: Diagnosis not present

## 2014-05-09 DIAGNOSIS — I10 Essential (primary) hypertension: Secondary | ICD-10-CM | POA: Diagnosis not present

## 2014-05-09 DIAGNOSIS — E780 Pure hypercholesterolemia: Secondary | ICD-10-CM | POA: Diagnosis not present

## 2014-05-09 DIAGNOSIS — E109 Type 1 diabetes mellitus without complications: Secondary | ICD-10-CM | POA: Diagnosis not present

## 2014-05-12 DIAGNOSIS — E109 Type 1 diabetes mellitus without complications: Secondary | ICD-10-CM | POA: Diagnosis not present

## 2014-05-12 DIAGNOSIS — M545 Low back pain: Secondary | ICD-10-CM | POA: Diagnosis not present

## 2014-05-12 DIAGNOSIS — G609 Hereditary and idiopathic neuropathy, unspecified: Secondary | ICD-10-CM | POA: Diagnosis not present

## 2014-05-12 DIAGNOSIS — I10 Essential (primary) hypertension: Secondary | ICD-10-CM | POA: Diagnosis not present

## 2014-05-16 ENCOUNTER — Ambulatory Visit (HOSPITAL_BASED_OUTPATIENT_CLINIC_OR_DEPARTMENT_OTHER): Payer: Medicare Other

## 2014-05-16 ENCOUNTER — Other Ambulatory Visit (HOSPITAL_BASED_OUTPATIENT_CLINIC_OR_DEPARTMENT_OTHER): Payer: Medicare Other

## 2014-05-16 DIAGNOSIS — M069 Rheumatoid arthritis, unspecified: Secondary | ICD-10-CM

## 2014-05-16 DIAGNOSIS — D638 Anemia in other chronic diseases classified elsewhere: Secondary | ICD-10-CM | POA: Diagnosis not present

## 2014-05-16 DIAGNOSIS — D508 Other iron deficiency anemias: Secondary | ICD-10-CM

## 2014-05-16 DIAGNOSIS — D568 Other thalassemias: Secondary | ICD-10-CM

## 2014-05-16 DIAGNOSIS — N289 Disorder of kidney and ureter, unspecified: Secondary | ICD-10-CM

## 2014-05-16 DIAGNOSIS — D509 Iron deficiency anemia, unspecified: Secondary | ICD-10-CM

## 2014-05-16 DIAGNOSIS — D569 Thalassemia, unspecified: Secondary | ICD-10-CM

## 2014-05-16 LAB — CBC WITH DIFFERENTIAL/PLATELET
BASO%: 0.4 % (ref 0.0–2.0)
BASOS ABS: 0 10*3/uL (ref 0.0–0.1)
EOS%: 1 % (ref 0.0–7.0)
Eosinophils Absolute: 0.1 10*3/uL (ref 0.0–0.5)
HCT: 29.3 % — ABNORMAL LOW (ref 34.8–46.6)
HEMOGLOBIN: 9.1 g/dL — AB (ref 11.6–15.9)
LYMPH%: 40.4 % (ref 14.0–49.7)
MCH: 21.5 pg — AB (ref 25.1–34.0)
MCHC: 31.1 g/dL — ABNORMAL LOW (ref 31.5–36.0)
MCV: 69.3 fL — AB (ref 79.5–101.0)
MONO#: 1.2 10*3/uL — ABNORMAL HIGH (ref 0.1–0.9)
MONO%: 10.2 % (ref 0.0–14.0)
NEUT#: 5.4 10*3/uL (ref 1.5–6.5)
NEUT%: 48 % (ref 38.4–76.8)
Platelets: 307 10*3/uL (ref 145–400)
RBC: 4.23 10*6/uL (ref 3.70–5.45)
RDW: 17.7 % — ABNORMAL HIGH (ref 11.2–14.5)
WBC: 11.3 10*3/uL — ABNORMAL HIGH (ref 3.9–10.3)
lymph#: 4.6 10*3/uL — ABNORMAL HIGH (ref 0.9–3.3)
nRBC: 0 % (ref 0–0)

## 2014-05-16 MED ORDER — DARBEPOETIN ALFA 300 MCG/0.6ML IJ SOSY
300.0000 ug | PREFILLED_SYRINGE | Freq: Once | INTRAMUSCULAR | Status: AC
  Administered 2014-05-16: 300 ug via SUBCUTANEOUS
  Filled 2014-05-16: qty 0.6

## 2014-05-16 NOTE — Patient Instructions (Signed)
Darbepoetin Alfa injection What is this medicine? DARBEPOETIN ALFA (dar be POE e tin AL fa) helps your body make more red blood cells. It is used to treat anemia caused by chronic kidney failure and chemotherapy. This medicine may be used for other purposes; ask your health care provider or pharmacist if you have questions. COMMON BRAND NAME(S): Aranesp What should I tell my health care provider before I take this medicine? They need to know if you have any of these conditions: -blood clotting disorders or history of blood clots -cancer patient not on chemotherapy -cystic fibrosis -heart disease, such as angina, heart failure, or a history of a heart attack -hemoglobin level of 12 g/dL or greater -high blood pressure -low levels of folate, iron, or vitamin B12 -seizures -an unusual or allergic reaction to darbepoetin, erythropoietin, albumin, hamster proteins, latex, other medicines, foods, dyes, or preservatives -pregnant or trying to get pregnant -breast-feeding How should I use this medicine? This medicine is for injection into a vein or under the skin. It is usually given by a health care professional in a hospital or clinic setting. If you get this medicine at home, you will be taught how to prepare and give this medicine. Do not shake the solution before you withdraw a dose. Use exactly as directed. Take your medicine at regular intervals. Do not take your medicine more often than directed. It is important that you put your used needles and syringes in a special sharps container. Do not put them in a trash can. If you do not have a sharps container, call your pharmacist or healthcare provider to get one. Talk to your pediatrician regarding the use of this medicine in children. While this medicine may be used in children as young as 1 year for selected conditions, precautions do apply. Overdosage: If you think you have taken too much of this medicine contact a poison control center or  emergency room at once. NOTE: This medicine is only for you. Do not share this medicine with others. What if I miss a dose? If you miss a dose, take it as soon as you can. If it is almost time for your next dose, take only that dose. Do not take double or extra doses. What may interact with this medicine? Do not take this medicine with any of the following medications: -epoetin alfa This list may not describe all possible interactions. Give your health care provider a list of all the medicines, herbs, non-prescription drugs, or dietary supplements you use. Also tell them if you smoke, drink alcohol, or use illegal drugs. Some items may interact with your medicine. What should I watch for while using this medicine? Visit your prescriber or health care professional for regular checks on your progress and for the needed blood tests and blood pressure measurements. It is especially important for the doctor to make sure your hemoglobin level is in the desired range, to limit the risk of potential side effects and to give you the best benefit. Keep all appointments for any recommended tests. Check your blood pressure as directed. Ask your doctor what your blood pressure should be and when you should contact him or her. As your body makes more red blood cells, you may need to take iron, folic acid, or vitamin B supplements. Ask your doctor or health care provider which products are right for you. If you have kidney disease continue dietary restrictions, even though this medication can make you feel better. Talk with your doctor or health   care professional about the foods you eat and the vitamins that you take. What side effects may I notice from receiving this medicine? Side effects that you should report to your doctor or health care professional as soon as possible: -allergic reactions like skin rash, itching or hives, swelling of the face, lips, or tongue -breathing problems -changes in vision -chest  pain -confusion, trouble speaking or understanding -feeling faint or lightheaded, falls -high blood pressure -muscle aches or pains -pain, swelling, warmth in the leg -rapid weight gain -severe headaches -sudden numbness or weakness of the face, arm or leg -trouble walking, dizziness, loss of balance or coordination -seizures (convulsions) -swelling of the ankles, feet, hands -unusually weak or tired Side effects that usually do not require medical attention (report to your doctor or health care professional if they continue or are bothersome): -diarrhea -fever, chills (flu-like symptoms) -headaches -nausea, vomiting -redness, stinging, or swelling at site where injected This list may not describe all possible side effects. Call your doctor for medical advice about side effects. You may report side effects to FDA at 1-800-FDA-1088. Where should I keep my medicine? Keep out of the reach of children. Store in a refrigerator between 2 and 8 degrees C (36 and 46 degrees F). Do not freeze. Do not shake. Throw away any unused portion if using a single-dose vial. Throw away any unused medicine after the expiration date. NOTE: This sheet is a summary. It may not cover all possible information. If you have questions about this medicine, talk to your doctor, pharmacist, or health care provider.  2015, [employer]. (2008-05-10 10:23:57)  

## 2014-05-18 ENCOUNTER — Ambulatory Visit: Payer: Medicare Other

## 2014-05-18 ENCOUNTER — Other Ambulatory Visit: Payer: Medicare Other

## 2014-05-18 DIAGNOSIS — S92411S Displaced fracture of proximal phalanx of right great toe, sequela: Secondary | ICD-10-CM | POA: Diagnosis not present

## 2014-05-18 DIAGNOSIS — M79674 Pain in right toe(s): Secondary | ICD-10-CM | POA: Diagnosis not present

## 2014-05-19 ENCOUNTER — Encounter (HOSPITAL_COMMUNITY): Payer: Self-pay | Admitting: Physician Assistant

## 2014-05-19 DIAGNOSIS — M7541 Impingement syndrome of right shoulder: Secondary | ICD-10-CM | POA: Diagnosis present

## 2014-05-19 DIAGNOSIS — M75101 Unspecified rotator cuff tear or rupture of right shoulder, not specified as traumatic: Secondary | ICD-10-CM | POA: Diagnosis present

## 2014-05-19 NOTE — Progress Notes (Signed)
Anesthesia Chart Review:  Patient is a 53 year old female posted for right shoulder arthroscopy with debridement, rotator cuff repair on 05/20/14 by Dr. Noemi Chapel. Case was moved for [facility] this afternoon due to her multiple co-morbidities.  History includes morbid obesity (BMI listed as 50), DM on insulin, RA, former smoker, HTN, HLD, OSA on CPAP, hepatic steatosis, beta thalassemia with iron deficiency anemia and anemia of chronic disease (Aranesp 05/16/14). Endocrinologist is Dr. Chalmers Cater who reportedly cleared patient from her standpoint.  Hematologist is Dr. Alen Blew. PCP is listed as Dr. Merrilee Seashore. She was recently evaluated by cardiologist Dr. Percival Spanish for dyspnea and atypical chest pain with work-up revealing a low risk stress test, normal EF, mild RV dysfunction, and PA peak pressure of 30.  Nuclear stress test on 03/31/14: Overall Impression: Low risk stress nuclear study Diapgragmatic attenuation with increased gut uptake of tracer. LV Wall Motion: NL LV Function; NL Wall Motion.  Echo 02/28/14:  - Left ventricle: The cavity size was normal. Wall thickness was increased in a pattern of moderate LVH. The estimated ejection fraction was 55%. Wall motion was normal; there were no regional wall motion abnormalities. Doppler parameters are consistent withhigh ventricular filling pressure. - Left atrium: The atrium was mildly dilated. - Right ventricle: Images of the RV are very limited. There is somesugestion of mild RV dysfunction. RV systolic pressure (S, est): 30 mm Hg. - Pulmonary arteries: PA peak pressure: 30 mm Hg (S).  EKG 03/18/14: NSR.  CXR 02/27/14: No acute cardiopulmonary disease.  Labs from 04/20/14 and 05/16/14 noted. H/H 9.1/29.3, which is consistent with her previous labs.  Minimal EBL expected, so Luna Glasgow, PA-C did not feel a preoperative T&S would be indicated.  Her glucose on 04/20/14 was 568. Patient reported her fasting glucose today was ~ 140. Kirsten reports that  per Balan's clearance note, patient's last A1C was 8.5.  Elnita Maxwell is planning to repeat an A1C on the DOS and follow-up results post-operatively.  Her last Cr was 1.6 (up from 1.14).  She is scheduled for a CMET and A1C on arrival tomorrow.      Anesthesiologist Dr. Orene Desanctis and I have both reviewed her chart. I also discussed with [place], PA-C.  She will get labs as ordered tomorrow.  Decision for block will be made following anesthesiologist evaluation tomorrow.   George Hugh [facility]/Anesthesiology Phone [phone] 05/19/2014 5:10 PM

## 2014-05-19 NOTE — H&P (Signed)
Jessica Howard is an 53 y.o. female.   Chief Complaint: right shoulder pain HPI: Jessica Howard is a 53 year old seen for evaluation of over a year of right shoulder pain, no specific injury. Pain with overhead use and activity relieved by rest with night pain as well. She saw Dr. Burney Gauze a year ago for right shoulder pain and had an MRI on 06/04/13 that revealed a rotator cuff tear that was non-displaced with impingement. She has rheumatoid arthritis under the care of Dr. Amil Amen. She is also an insulin dependent diabetic. She injured her shoulder with a new fall on 03/11/14 landing on both arms and has had increased pain and decreased range of motion of the shoulder since then.  Past Medical History  Diagnosis Date  . Unspecified deficiency anemia   . Hypertension   . Diabetes mellitus   . Hyperlipidemia   . Rheumatoid arthritis(714.0)   . Endometrial polyp   . Fibroadenoma of breast     left  . Hepatic steatosis   . Sleep apnea     Past Surgical History  Procedure Laterality Date  . Tubal ligation      bilateral  . Hand surgery      for arthritis  . Tonsillectomy    . Left knee surgery nodule removal    . Other surgical history      multiple nodule removal on neck, hands, left knee  . Dilation and curettage of uterus      Family History  Problem Relation Age of Onset  . Diabetes Mother   . Cancer Mother     breast  . Diabetes Sister   . Cancer Brother     bone marrow cancer   Social History:  reports that she quit smoking about 11 months ago. Her smoking use included Cigarettes. She has a 1.5 pack-year smoking history. She has never used smokeless tobacco. She reports that she does not drink alcohol or use illicit drugs.  Allergies:  Allergies  Allergen Reactions  . Bactrim [Sulfamethoxazole-Trimethoprim] Hives  . Cefuroxime Axetil Itching  . Cephalosporins Itching  . Lisinopril Cough  . Penicillins Hives  . Sulfa Antibiotics Hives  . Iohexol Itching and Rash      Code: RASH, Desc: HAD ITCHING AND A RASH ABOUT ONE HOUR AFTER RETURNING HOME FROM THE CT, Onset Date: [id number]   No current facility-administered medications for this encounter. Current outpatient prescriptions: abatacept (ORENCIA) 250 MG injection, Inject 1,000 mg into the vein every 28 (twenty-eight) days. Monthly Infusion, Disp: , Rfl: ;  amitriptyline (ELAVIL) 25 MG tablet, Take 25 mg by mouth daily. , Disp: , Rfl: ;  baclofen (LIORESAL) 10 MG tablet, Take 10 mg by mouth See admin instructions. May take 10 mg twice weekly if needed for headaches, Disp: , Rfl:  BD INSULIN SYRINGE ULTRAFINE 31G X 15/64" 1 ML MISC, , Disp: , Rfl: ;  BYSTOLIC 10 MG tablet, , Disp: , Rfl: ;  chlorthalidone (HYGROTON) 25 MG tablet, Take 25 mg by mouth daily., Disp: , Rfl: ;  CRESTOR 20 MG tablet, , Disp: , Rfl: ;  diltiazem (CARDIZEM CD) 240 MG 24 hr capsule, Take 240 mg by mouth daily. , Disp: , Rfl:  doxycycline (VIBRAMYCIN) 100 MG capsule, Take 1 capsule (100 mg total) by mouth 2 (two) times daily., Disp: 20 capsule, Rfl: 0;  EDECRIN 25 MG tablet, , Disp: , Rfl: 3;  fluticasone (FLONASE) 50 MCG/ACT nasal spray, Place 1 spray into both nostrils daily as needed  for allergies or rhinitis., Disp: , Rfl: ;  furosemide (LASIX) 20 MG tablet, Take 0.5 tablets (10 mg total) by mouth daily., Disp: 15 tablet, Rfl: 0 gabapentin (NEURONTIN) 300 MG capsule, Take 600 mg by mouth 3 (three) times daily. , Disp: , Rfl: ;  HYDROcodone-acetaminophen (NORCO) 5-325 MG per tablet, Take 1 tablet by mouth every 6 (six) hours as needed for moderate pain., Disp: 20 tablet, Rfl: 0;  HYDROcodone-acetaminophen (NORCO/VICODIN) 5-325 MG per tablet, Take 1 tablet by mouth every 6 (six) hours as needed (pain). , Disp: , Rfl:  insulin lispro (HUMALOG) 100 UNIT/ML injection, Inject 40 Units into the skin 3 (three) times daily before meals. Per sliding scale, Disp: , Rfl: ;  insulin NPH (HUMULIN N,NOVOLIN N) 100 UNIT/ML injection, Inject 75 Units into the skin  3 (three) times daily with meals. 75 units with breakfast; lunch 75 units; 75 units at dinner, Disp: , Rfl: ;  irbesartan (AVAPRO) 300 MG tablet, Take 300 mg by mouth daily. , Disp: , Rfl:  LOTEMAX 0.5 % GEL, , Disp: , Rfl: 0;  Methotrexate Sodium, PF, 50 MG/2ML SOLN, Inject 20 mg into the skin once a week. Takes on Sundays, Disp: , Rfl: ;  Nebivolol HCl (BYSTOLIC) 20 MG TABS, Take 20 mg by mouth daily., Disp: , Rfl: ;  ONETOUCH VERIO test strip, , Disp: , Rfl: 5;  pantoprazole (PROTONIX) 40 MG tablet, Take 40 mg by mouth daily., Disp: , Rfl: ;  predniSONE (DELTASONE) 5 MG tablet, Take 5 mg by mouth daily., Disp: , Rfl:  rosuvastatin (CRESTOR) 10 MG tablet, Take 10 mg by mouth daily., Disp: , Rfl: ;  tiZANidine (ZANAFLEX) 4 MG tablet, Take 4 mg by mouth 2 (two) times daily as needed (migraines). Limit 2 days per week, Disp: , Rfl: ;  topiramate (TOPAMAX) 100 MG tablet, , Disp: , Rfl: ;  topiramate (TOPAMAX) 25 MG tablet, Take 25 mg by mouth at bedtime. Migraine preventative, Disp: , Rfl:  VOLTAREN 1 % GEL, Apply 1 application topically 3 (three) times daily. , Disp: , Rfl:   No prescriptions prior to admission    No results found for this or any previous visit (from the past 48 hour(s)). No results found.  Review of Systems  Constitutional: Negative.   HENT: Negative.   Eyes: Negative.   Respiratory: Negative.   Cardiovascular: Negative.   Gastrointestinal: Negative.   Genitourinary: Negative.   Musculoskeletal: Positive for joint pain.       Right shoulder  Skin: Negative.   Neurological: Negative.   Endo/Heme/Allergies: Negative.   Psychiatric/Behavioral: Negative.     Last menstrual period 12/27/2013. Physical Exam  Constitutional: She is oriented to person, place, and time. She appears well-developed and well-nourished.  Morbidly obese  HENT:  Head: Normocephalic and atraumatic.  Mouth/Throat: Oropharynx is clear and moist.  Eyes: EOM are normal. Pupils are equal, round, and  reactive to light.  Neck: Neck supple.  Cardiovascular: Normal rate.   Respiratory: Effort normal.  GI: Soft.  Genitourinary:  Not pertinent to current symptomatology therefore not examined.  Musculoskeletal:  Examination of her right shoulder reveals forward flexion of 170 with pain and mild weakness abduction of 160 with pain and mild weakness internal and external rotation of 60 degrees with pain and mild weakness no instability. Exam of her left shoulder reveals full range of motion without pain weakness or instability. Vascular exam: pulses 2+ and symmetric  Neurological: She is alert and oriented to person, place, and time.  Skin: Skin is warm and dry.  Psychiatric: She has a normal mood and affect.     Assessment .Principal Problem:   Nontraumatic tear of right rotator cuff Active Problems:   Iron deficiency anemia   Thalassemia   Rheumatoid arthritis   Morbid obesity   Renal insufficiency   Back pain   Essential hypertension   Diabetes   Obstructive sleep apnea   Acute on chronic diastolic congestive heart failure, NYHA class 2   Impingement syndrome of right shoulder   Plan Jessica Howard is a 53 year old seen for evaluation of over a year of right shoulder pain, no specific injury. Pain with overhead use and activity relieved by rest with night pain as well. She saw Dr. Burney Gauze a year ago for right shoulder pain and had an MRI on 06/04/13 that revealed a rotator cuff tear that was non-displaced with impingement. She has rheumatoid arthritis under the care of Dr. Amil Amen. She is also an insulin dependent diabetic. She injured her shoulder with a new fall on 03/11/14 landing on both arms and has had increased pain and decreased range of motion of the shoulder since then.  The risks, benefits, and possible complications of the procedure were discussed in detail with the patient.  The patient is without question.  Yazaira Speas J 05/19/2014, 3:48 PM

## 2014-05-20 ENCOUNTER — Encounter (HOSPITAL_COMMUNITY)
Admission: RE | Disposition: A | Discharge status: Home / Self Care | Payer: Self-pay | Source: Ambulatory Visit | Attending: Orthopedic Surgery

## 2014-05-20 ENCOUNTER — Ambulatory Visit (HOSPITAL_COMMUNITY): Payer: Medicare Other | Admitting: Vascular Surgery

## 2014-05-20 ENCOUNTER — Encounter (HOSPITAL_COMMUNITY): Payer: Self-pay | Admitting: *Deleted

## 2014-05-20 ENCOUNTER — Ambulatory Visit: Admit: 2014-05-20 | Payer: Self-pay | Admitting: Orthopedic Surgery

## 2014-05-20 ENCOUNTER — Ambulatory Visit (HOSPITAL_COMMUNITY)
Admission: RE | Admit: 2014-05-20 | Discharge: 2014-05-21 | Disposition: A | Discharge status: Home / Self Care | Payer: Medicare Other | Source: Ambulatory Visit | Attending: Orthopedic Surgery | Admitting: Orthopedic Surgery

## 2014-05-20 DIAGNOSIS — Z881 Allergy status to other antibiotic agents status: Secondary | ICD-10-CM | POA: Insufficient documentation

## 2014-05-20 DIAGNOSIS — G4733 Obstructive sleep apnea (adult) (pediatric): Secondary | ICD-10-CM | POA: Diagnosis not present

## 2014-05-20 DIAGNOSIS — S43431A Superior glenoid labrum lesion of right shoulder, initial encounter: Secondary | ICD-10-CM | POA: Insufficient documentation

## 2014-05-20 DIAGNOSIS — E1165 Type 2 diabetes mellitus with hyperglycemia: Secondary | ICD-10-CM

## 2014-05-20 DIAGNOSIS — M069 Rheumatoid arthritis, unspecified: Secondary | ICD-10-CM | POA: Insufficient documentation

## 2014-05-20 DIAGNOSIS — Z6841 Body Mass Index (BMI) 40.0 and over, adult: Secondary | ICD-10-CM | POA: Insufficient documentation

## 2014-05-20 DIAGNOSIS — Z87891 Personal history of nicotine dependence: Secondary | ICD-10-CM | POA: Insufficient documentation

## 2014-05-20 DIAGNOSIS — D569 Thalassemia, unspecified: Secondary | ICD-10-CM | POA: Diagnosis not present

## 2014-05-20 DIAGNOSIS — G8918 Other acute postprocedural pain: Secondary | ICD-10-CM | POA: Diagnosis not present

## 2014-05-20 DIAGNOSIS — I5033 Acute on chronic diastolic (congestive) heart failure: Secondary | ICD-10-CM | POA: Insufficient documentation

## 2014-05-20 DIAGNOSIS — D568 Other thalassemias: Secondary | ICD-10-CM | POA: Diagnosis not present

## 2014-05-20 DIAGNOSIS — E785 Hyperlipidemia, unspecified: Secondary | ICD-10-CM | POA: Diagnosis not present

## 2014-05-20 DIAGNOSIS — M7541 Impingement syndrome of right shoulder: Secondary | ICD-10-CM | POA: Insufficient documentation

## 2014-05-20 DIAGNOSIS — K219 Gastro-esophageal reflux disease without esophagitis: Secondary | ICD-10-CM | POA: Insufficient documentation

## 2014-05-20 DIAGNOSIS — J45909 Unspecified asthma, uncomplicated: Secondary | ICD-10-CM | POA: Diagnosis not present

## 2014-05-20 DIAGNOSIS — Z888 Allergy status to other drugs, medicaments and biological substances status: Secondary | ICD-10-CM | POA: Diagnosis not present

## 2014-05-20 DIAGNOSIS — N289 Disorder of kidney and ureter, unspecified: Secondary | ICD-10-CM

## 2014-05-20 DIAGNOSIS — M75121 Complete rotator cuff tear or rupture of right shoulder, not specified as traumatic: Secondary | ICD-10-CM | POA: Diagnosis not present

## 2014-05-20 DIAGNOSIS — M75101 Unspecified rotator cuff tear or rupture of right shoulder, not specified as traumatic: Secondary | ICD-10-CM | POA: Diagnosis not present

## 2014-05-20 DIAGNOSIS — I10 Essential (primary) hypertension: Secondary | ICD-10-CM | POA: Insufficient documentation

## 2014-05-20 DIAGNOSIS — E1140 Type 2 diabetes mellitus with diabetic neuropathy, unspecified: Secondary | ICD-10-CM | POA: Diagnosis not present

## 2014-05-20 DIAGNOSIS — K760 Fatty (change of) liver, not elsewhere classified: Secondary | ICD-10-CM | POA: Insufficient documentation

## 2014-05-20 DIAGNOSIS — E119 Type 2 diabetes mellitus without complications: Secondary | ICD-10-CM

## 2014-05-20 DIAGNOSIS — M549 Dorsalgia, unspecified: Secondary | ICD-10-CM | POA: Diagnosis not present

## 2014-05-20 DIAGNOSIS — Z882 Allergy status to sulfonamides status: Secondary | ICD-10-CM | POA: Diagnosis not present

## 2014-05-20 DIAGNOSIS — Z880 Allergy status to penicillin: Secondary | ICD-10-CM | POA: Insufficient documentation

## 2014-05-20 DIAGNOSIS — D509 Iron deficiency anemia, unspecified: Secondary | ICD-10-CM | POA: Diagnosis not present

## 2014-05-20 HISTORY — DX: Unspecified asthma, uncomplicated: J45.909

## 2014-05-20 HISTORY — DX: Unspecified rotator cuff tear or rupture of right shoulder, not specified as traumatic: M75.101

## 2014-05-20 HISTORY — DX: Headache, unspecified: R51.9

## 2014-05-20 HISTORY — DX: Pneumonia, unspecified organism: J18.9

## 2014-05-20 HISTORY — DX: Gastro-esophageal reflux disease without esophagitis: K21.9

## 2014-05-20 HISTORY — DX: Type 2 diabetes mellitus with diabetic neuropathy, unspecified: E11.40

## 2014-05-20 HISTORY — DX: Headache: R51

## 2014-05-20 HISTORY — DX: Psoriasis, unspecified: L40.9

## 2014-05-20 HISTORY — PX: SHOULDER ARTHROSCOPY WITH ROTATOR CUFF REPAIR AND SUBACROMIAL DECOMPRESSION: SHX5686

## 2014-05-20 HISTORY — DX: Disorder of thyroid, unspecified: E07.9

## 2014-05-20 HISTORY — DX: Impingement syndrome of right shoulder: M75.41

## 2014-05-20 LAB — CBC
HEMATOCRIT: 27.8 % — AB (ref 36.0–46.0)
Hemoglobin: 8.6 g/dL — ABNORMAL LOW (ref 12.0–15.0)
MCH: 21.2 pg — ABNORMAL LOW (ref 26.0–34.0)
MCHC: 30.9 g/dL (ref 30.0–36.0)
MCV: 68.5 fL — ABNORMAL LOW (ref 78.0–100.0)
Platelets: 287 10*3/uL (ref 150–400)
RBC: 4.06 MIL/uL (ref 3.87–5.11)
RDW: 17.4 % — AB (ref 11.5–15.5)
WBC: 8.9 10*3/uL (ref 4.0–10.5)

## 2014-05-20 LAB — COMPREHENSIVE METABOLIC PANEL
ALT: 22 U/L (ref 0–35)
AST: 31 U/L (ref 0–37)
Albumin: 3.6 g/dL (ref 3.5–5.2)
Alkaline Phosphatase: 98 U/L (ref 39–117)
Anion gap: 18 — ABNORMAL HIGH (ref 5–15)
BILIRUBIN TOTAL: 0.4 mg/dL (ref 0.3–1.2)
BUN: 28 mg/dL — ABNORMAL HIGH (ref 6–23)
CALCIUM: 9.5 mg/dL (ref 8.4–10.5)
CHLORIDE: 100 meq/L (ref 96–112)
CO2: 21 meq/L (ref 19–32)
CREATININE: 1.01 mg/dL (ref 0.50–1.10)
GFR, EST AFRICAN AMERICAN: 73 mL/min — AB (ref >90)
GFR, EST NON AFRICAN AMERICAN: 63 mL/min — AB (ref >90)
GLUCOSE: 157 mg/dL — AB (ref 70–99)
Potassium: 3.9 mEq/L (ref 3.7–5.3)
Sodium: 139 mEq/L (ref 137–147)
Total Protein: 7.4 g/dL (ref 6.0–8.3)

## 2014-05-20 LAB — GLUCOSE, CAPILLARY
GLUCOSE-CAPILLARY: 207 mg/dL — AB (ref 70–99)
Glucose-Capillary: 162 mg/dL — ABNORMAL HIGH (ref 70–99)
Glucose-Capillary: 163 mg/dL — ABNORMAL HIGH (ref 70–99)
Glucose-Capillary: 214 mg/dL — ABNORMAL HIGH (ref 70–99)

## 2014-05-20 LAB — HEMOGLOBIN A1C
Hgb A1c MFr Bld: 8.8 % — ABNORMAL HIGH (ref <5.7)
Mean Plasma Glucose: 206 mg/dL — ABNORMAL HIGH (ref <117)

## 2014-05-20 LAB — SURGICAL PCR SCREEN
MRSA, PCR: NEGATIVE
STAPHYLOCOCCUS AUREUS: POSITIVE — AB

## 2014-05-20 LAB — CREATININE, SERUM
CREATININE: 0.99 mg/dL (ref 0.50–1.10)
GFR calc non Af Amer: 65 mL/min — ABNORMAL LOW (ref >90)
GFR, EST AFRICAN AMERICAN: 75 mL/min — AB (ref >90)

## 2014-05-20 SURGERY — SHOULDER ARTHROSCOPY WITH ROTATOR CUFF REPAIR AND SUBACROMIAL DECOMPRESSION
Anesthesia: General | Site: Shoulder | Laterality: Right

## 2014-05-20 SURGERY — SHOULDER ARTHROSCOPY WITH SUBACROMIAL DECOMPRESSION AND DISTAL CLAVICLE EXCISION
Anesthesia: General | Laterality: Right

## 2014-05-20 MED ORDER — EPINEPHRINE HCL 1 MG/ML IJ SOLN
INTRAMUSCULAR | Status: DC | PRN
  Administered 2014-05-20: 1 mg

## 2014-05-20 MED ORDER — PHENYLEPHRINE HCL 10 MG/ML IJ SOLN
10.0000 mg | INTRAVENOUS | Status: DC | PRN
  Administered 2014-05-20: 15 ug/min via INTRAVENOUS

## 2014-05-20 MED ORDER — HYDROMORPHONE HCL 1 MG/ML IJ SOLN
0.5000 mg | INTRAMUSCULAR | Status: DC | PRN
  Administered 2014-05-21 (×3): 1 mg via INTRAVENOUS
  Filled 2014-05-20 (×4): qty 1

## 2014-05-20 MED ORDER — ONDANSETRON HCL 4 MG/2ML IJ SOLN: 4.0000 mg | Freq: Four times a day (QID) | INTRAMUSCULAR | Status: DC | PRN

## 2014-05-20 MED ORDER — ACETAMINOPHEN 325 MG PO TABS: 650.0000 mg | ORAL_TABLET | Freq: Four times a day (QID) | ORAL | Status: DC | PRN

## 2014-05-20 MED ORDER — PROPOFOL 10 MG/ML IV BOLUS
INTRAVENOUS | Status: AC
  Filled 2014-05-20: qty 20

## 2014-05-20 MED ORDER — MIDAZOLAM HCL 2 MG/2ML IJ SOLN: 1.0000 mg | Freq: Once | INTRAMUSCULAR | Status: DC

## 2014-05-20 MED ORDER — ROSUVASTATIN CALCIUM 20 MG PO TABS
20.0000 mg | ORAL_TABLET | Freq: Every day | ORAL | Status: DC
  Administered 2014-05-20 – 2014-05-21 (×2): 20 mg via ORAL
  Filled 2014-05-20 (×2): qty 1

## 2014-05-20 MED ORDER — FUROSEMIDE 20 MG PO TABS
10.0000 mg | ORAL_TABLET | Freq: Every day | ORAL | Status: DC
  Administered 2014-05-21: 10 mg via ORAL
  Filled 2014-05-20: qty 0.5

## 2014-05-20 MED ORDER — ACETAMINOPHEN 650 MG RE SUPP: 650.0000 mg | Freq: Four times a day (QID) | RECTAL | Status: DC | PRN

## 2014-05-20 MED ORDER — ABATACEPT 250 MG IV SOLR: 1000.0000 mg | INTRAVENOUS | Status: DC

## 2014-05-20 MED ORDER — INSULIN ASPART 100 UNIT/ML ~~LOC~~ SOLN: 40.0000 [IU] | Freq: Three times a day (TID) | SUBCUTANEOUS | Status: DC

## 2014-05-20 MED ORDER — MENTHOL 3 MG MT LOZG
1.0000 | LOZENGE | OROMUCOSAL | Status: DC | PRN
  Filled 2014-05-20: qty 9

## 2014-05-20 MED ORDER — FLUTICASONE PROPIONATE 50 MCG/ACT NA SUSP
1.0000 | Freq: Every day | NASAL | Status: DC | PRN
  Filled 2014-05-20: qty 16

## 2014-05-20 MED ORDER — NEBIVOLOL HCL 20 MG PO TABS: 20.0000 mg | ORAL_TABLET | Freq: Every day | ORAL | Status: DC

## 2014-05-20 MED ORDER — ONDANSETRON HCL 4 MG PO TABS: 4.0000 mg | ORAL_TABLET | Freq: Four times a day (QID) | ORAL | Status: DC | PRN

## 2014-05-20 MED ORDER — MENTHOL 3 MG MT LOZG
1.0000 | LOZENGE | OROMUCOSAL | Status: DC | PRN
  Administered 2014-05-20: 3 mg via ORAL
  Filled 2014-05-20: qty 9

## 2014-05-20 MED ORDER — OXYCODONE HCL 5 MG PO TABS: 5.0000 mg | ORAL_TABLET | Freq: Once | ORAL | Status: DC | PRN

## 2014-05-20 MED ORDER — NEBIVOLOL HCL 10 MG PO TABS
20.0000 mg | ORAL_TABLET | Freq: Every day | ORAL | Status: DC
  Administered 2014-05-21: 20 mg via ORAL
  Filled 2014-05-20: qty 2

## 2014-05-20 MED ORDER — POLYETHYLENE GLYCOL 3350 17 G PO PACK
17.0000 g | PACK | Freq: Two times a day (BID) | ORAL | Status: DC
  Administered 2014-05-20 – 2014-05-21 (×2): 17 g via ORAL
  Filled 2014-05-20 (×3): qty 1

## 2014-05-20 MED ORDER — MIDAZOLAM HCL 2 MG/2ML IJ SOLN
INTRAMUSCULAR | Status: AC
  Filled 2014-05-20: qty 2

## 2014-05-20 MED ORDER — POVIDONE-IODINE 7.5 % EX SOLN: Freq: Once | CUTANEOUS | Status: DC

## 2014-05-20 MED ORDER — METOCLOPRAMIDE HCL 5 MG/ML IJ SOLN
5.0000 mg | Freq: Three times a day (TID) | INTRAMUSCULAR | Status: DC | PRN
  Filled 2014-05-20: qty 2

## 2014-05-20 MED ORDER — NEOSTIGMINE METHYLSULFATE 10 MG/10ML IV SOLN
INTRAVENOUS | Status: DC | PRN
  Administered 2014-05-20: 5 mg via INTRAVENOUS

## 2014-05-20 MED ORDER — INSULIN NPH (HUMAN) (ISOPHANE) 100 UNIT/ML ~~LOC~~ SUSP
75.0000 [IU] | Freq: Three times a day (TID) | SUBCUTANEOUS | Status: DC
  Administered 2014-05-21: 75 [IU] via SUBCUTANEOUS
  Filled 2014-05-20: qty 10

## 2014-05-20 MED ORDER — INSULIN ASPART 100 UNIT/ML ~~LOC~~ SOLN: 0.0000 [IU] | Freq: Three times a day (TID) | SUBCUTANEOUS | Status: DC

## 2014-05-20 MED ORDER — VANCOMYCIN HCL 10 G IV SOLR
1500.0000 mg | INTRAVENOUS | Status: DC
  Filled 2014-05-20 (×2): qty 1500

## 2014-05-20 MED ORDER — LACTATED RINGERS IV SOLN
INTRAVENOUS | Status: DC | PRN
  Administered 2014-05-20 (×2): via INTRAVENOUS

## 2014-05-20 MED ORDER — MUPIROCIN 2 % EX OINT
1.0000 "application " | TOPICAL_OINTMENT | Freq: Once | CUTANEOUS | Status: AC
  Administered 2014-05-20: 1 via TOPICAL
  Filled 2014-05-20: qty 22

## 2014-05-20 MED ORDER — MIDAZOLAM HCL 5 MG/5ML IJ SOLN
INTRAMUSCULAR | Status: DC | PRN
  Administered 2014-05-20: 2 mg via INTRAVENOUS

## 2014-05-20 MED ORDER — SODIUM CHLORIDE 0.9 % IR SOLN
Status: DC | PRN
  Administered 2014-05-20 (×3): 3000 mL

## 2014-05-20 MED ORDER — PHENOL 1.4 % MT LIQD: 1.0000 | OROMUCOSAL | Status: DC | PRN

## 2014-05-20 MED ORDER — PROPOFOL 10 MG/ML IV BOLUS
INTRAVENOUS | Status: AC
Start: 2014-05-20 — End: 2014-05-20
  Filled 2014-05-20: qty 20

## 2014-05-20 MED ORDER — VANCOMYCIN HCL IN DEXTROSE 1-5 GM/200ML-% IV SOLN
1000.0000 mg | Freq: Two times a day (BID) | INTRAVENOUS | Status: AC
  Administered 2014-05-20: 1000 mg via INTRAVENOUS
  Filled 2014-05-20: qty 200

## 2014-05-20 MED ORDER — BUPIVACAINE-EPINEPHRINE (PF) 0.25% -1:200000 IJ SOLN
INTRAMUSCULAR | Status: AC
  Filled 2014-05-20: qty 30

## 2014-05-20 MED ORDER — HYDROMORPHONE HCL 1 MG/ML IJ SOLN: 0.5000 mg | INTRAMUSCULAR | Status: DC | PRN

## 2014-05-20 MED ORDER — AMITRIPTYLINE HCL 25 MG PO TABS
25.0000 mg | ORAL_TABLET | Freq: Every day | ORAL | Status: DC
  Administered 2014-05-20: 25 mg via ORAL
  Filled 2014-05-20 (×2): qty 1

## 2014-05-20 MED ORDER — EPINEPHRINE HCL 1 MG/ML IJ SOLN
INTRAMUSCULAR | Status: AC
  Filled 2014-05-20: qty 1

## 2014-05-20 MED ORDER — CHLORTHALIDONE 25 MG PO TABS
25.0000 mg | ORAL_TABLET | Freq: Every day | ORAL | Status: DC
  Administered 2014-05-21: 25 mg via ORAL
  Filled 2014-05-20: qty 1

## 2014-05-20 MED ORDER — INSULIN ASPART 100 UNIT/ML ~~LOC~~ SOLN
0.0000 [IU] | Freq: Every day | SUBCUTANEOUS | Status: DC
  Administered 2014-05-20: 2 [IU] via SUBCUTANEOUS

## 2014-05-20 MED ORDER — SUCCINYLCHOLINE CHLORIDE 20 MG/ML IJ SOLN
INTRAMUSCULAR | Status: DC | PRN
  Administered 2014-05-20: 120 mg via INTRAVENOUS

## 2014-05-20 MED ORDER — MIDAZOLAM HCL 2 MG/2ML IJ SOLN
INTRAMUSCULAR | Status: AC
  Administered 2014-05-20: 1 mg
  Filled 2014-05-20: qty 2

## 2014-05-20 MED ORDER — LACTATED RINGERS IV SOLN
INTRAVENOUS | Status: DC
  Administered 2014-05-20: 09:00:00 via INTRAVENOUS

## 2014-05-20 MED ORDER — PANTOPRAZOLE SODIUM 40 MG PO TBEC
40.0000 mg | DELAYED_RELEASE_TABLET | Freq: Every day | ORAL | Status: DC
  Administered 2014-05-21: 40 mg via ORAL
  Filled 2014-05-20: qty 1

## 2014-05-20 MED ORDER — TIZANIDINE HCL 4 MG PO TABS
4.0000 mg | ORAL_TABLET | Freq: Two times a day (BID) | ORAL | Status: DC | PRN
  Filled 2014-05-20 (×2): qty 1

## 2014-05-20 MED ORDER — ONDANSETRON HCL 4 MG/2ML IJ SOLN: 4.0000 mg | Freq: Once | INTRAMUSCULAR | Status: DC | PRN

## 2014-05-20 MED ORDER — FENTANYL CITRATE 0.05 MG/ML IJ SOLN
INTRAMUSCULAR | Status: DC | PRN
  Administered 2014-05-20: 50 ug via INTRAVENOUS
  Administered 2014-05-20: 75 ug via INTRAVENOUS
  Administered 2014-05-20: 25 ug via INTRAVENOUS

## 2014-05-20 MED ORDER — GABAPENTIN 300 MG PO CAPS
600.0000 mg | ORAL_CAPSULE | Freq: Three times a day (TID) | ORAL | Status: DC
  Administered 2014-05-20 – 2014-05-21 (×2): 600 mg via ORAL
  Filled 2014-05-20 (×4): qty 2

## 2014-05-20 MED ORDER — OXYCODONE HCL 5 MG PO TABS: 5.0000 mg | ORAL_TABLET | ORAL | Status: DC | PRN

## 2014-05-20 MED ORDER — HYDROMORPHONE HCL 1 MG/ML IJ SOLN
INTRAMUSCULAR | Status: AC
  Filled 2014-05-20: qty 1

## 2014-05-20 MED ORDER — FENTANYL CITRATE 0.05 MG/ML IJ SOLN
INTRAMUSCULAR | Status: AC
  Filled 2014-05-20: qty 5

## 2014-05-20 MED ORDER — BUPIVACAINE-EPINEPHRINE (PF) 0.5% -1:200000 IJ SOLN
INTRAMUSCULAR | Status: DC | PRN
  Administered 2014-05-20: 20 mL via PERINEURAL

## 2014-05-20 MED ORDER — SODIUM CHLORIDE 0.9 % IV SOLN
1000.0000 mg | INTRAVENOUS | Status: DC | PRN
  Administered 2014-05-20: 1500 mg via INTRAVENOUS

## 2014-05-20 MED ORDER — POTASSIUM CHLORIDE IN NACL 20-0.9 MEQ/L-% IV SOLN
INTRAVENOUS | Status: DC
  Administered 2014-05-20: 22:00:00 via INTRAVENOUS
  Filled 2014-05-20 (×3): qty 1000

## 2014-05-20 MED ORDER — PREDNISONE 5 MG PO TABS
5.0000 mg | ORAL_TABLET | Freq: Every day | ORAL | Status: DC
  Administered 2014-05-21: 5 mg via ORAL
  Filled 2014-05-20: qty 1

## 2014-05-20 MED ORDER — LIDOCAINE HCL (CARDIAC) 20 MG/ML IV SOLN
INTRAVENOUS | Status: DC | PRN
  Administered 2014-05-20: 80 mg via INTRAVENOUS

## 2014-05-20 MED ORDER — INSULIN ASPART 100 UNIT/ML ~~LOC~~ SOLN
40.0000 [IU] | Freq: Three times a day (TID) | SUBCUTANEOUS | Status: DC
  Administered 2014-05-21: 45 [IU] via SUBCUTANEOUS

## 2014-05-20 MED ORDER — DILTIAZEM HCL ER COATED BEADS 240 MG PO CP24
240.0000 mg | ORAL_CAPSULE | Freq: Every day | ORAL | Status: DC
  Administered 2014-05-21: 240 mg via ORAL
  Filled 2014-05-20: qty 1

## 2014-05-20 MED ORDER — ENOXAPARIN SODIUM 40 MG/0.4ML ~~LOC~~ SOLN
40.0000 mg | SUBCUTANEOUS | Status: DC
  Administered 2014-05-21: 40 mg via SUBCUTANEOUS
  Filled 2014-05-20 (×2): qty 0.4

## 2014-05-20 MED ORDER — SODIUM CHLORIDE 0.9 % IV SOLN: INTRAVENOUS | Status: DC

## 2014-05-20 MED ORDER — PROPOFOL 10 MG/ML IV BOLUS
INTRAVENOUS | Status: DC | PRN
  Administered 2014-05-20: 200 mg via INTRAVENOUS

## 2014-05-20 MED ORDER — IRBESARTAN 300 MG PO TABS
300.0000 mg | ORAL_TABLET | Freq: Every day | ORAL | Status: DC
  Administered 2014-05-21: 300 mg via ORAL
  Filled 2014-05-20: qty 1

## 2014-05-20 MED ORDER — OXYCODONE HCL 5 MG/5ML PO SOLN: 5.0000 mg | Freq: Once | ORAL | Status: DC | PRN

## 2014-05-20 MED ORDER — FENTANYL CITRATE 0.05 MG/ML IJ SOLN
INTRAMUSCULAR | Status: DC
Start: 2014-05-20 — End: 2014-05-20
  Filled 2014-05-20: qty 2

## 2014-05-20 MED ORDER — HYDROCODONE-ACETAMINOPHEN 10-325 MG PO TABS
1.0000 | ORAL_TABLET | ORAL | Status: DC | PRN
  Administered 2014-05-21 (×2): 1 via ORAL
  Filled 2014-05-20 (×2): qty 1

## 2014-05-20 MED ORDER — GLYCOPYRROLATE 0.2 MG/ML IJ SOLN
INTRAMUSCULAR | Status: DC | PRN
  Administered 2014-05-20: .8 mg via INTRAVENOUS

## 2014-05-20 MED ORDER — PHENYLEPHRINE HCL 10 MG/ML IJ SOLN
INTRAMUSCULAR | Status: DC | PRN
  Administered 2014-05-20 (×2): 80 ug via INTRAVENOUS

## 2014-05-20 MED ORDER — TOPIRAMATE 100 MG PO TABS
100.0000 mg | ORAL_TABLET | Freq: Every day | ORAL | Status: DC
  Administered 2014-05-20 – 2014-05-21 (×2): 100 mg via ORAL
  Filled 2014-05-20 (×2): qty 1

## 2014-05-20 MED ORDER — ONDANSETRON HCL 4 MG/2ML IJ SOLN
INTRAMUSCULAR | Status: DC | PRN
  Administered 2014-05-20: 4 mg via INTRAVENOUS

## 2014-05-20 MED ORDER — ROCURONIUM BROMIDE 100 MG/10ML IV SOLN
INTRAVENOUS | Status: DC | PRN
  Administered 2014-05-20: 30 mg via INTRAVENOUS

## 2014-05-20 MED ORDER — MEPERIDINE HCL 25 MG/ML IJ SOLN: 6.2500 mg | INTRAMUSCULAR | Status: DC | PRN

## 2014-05-20 MED ORDER — METOCLOPRAMIDE HCL 5 MG PO TABS
5.0000 mg | ORAL_TABLET | Freq: Three times a day (TID) | ORAL | Status: DC | PRN
  Filled 2014-05-20: qty 2

## 2014-05-20 MED ORDER — HYDROMORPHONE HCL 1 MG/ML IJ SOLN
0.2500 mg | INTRAMUSCULAR | Status: DC | PRN
  Administered 2014-05-20 (×2): 0.5 mg via INTRAVENOUS

## 2014-05-20 MED ORDER — DOCUSATE SODIUM 100 MG PO CAPS
100.0000 mg | ORAL_CAPSULE | Freq: Two times a day (BID) | ORAL | Status: DC
  Administered 2014-05-20 – 2014-05-21 (×2): 100 mg via ORAL
  Filled 2014-05-20 (×2): qty 1

## 2014-05-20 SURGICAL SUPPLY — 82 items
ANCH SUT SWLK 19.1 CLS EYLT VT (Anchor) ×2 IMPLANT
ANCHOR BIO SWLOCK 4.75 W/TIG (Anchor) ×6 IMPLANT
APL SKNCLS STERI-STRIP NONHPOA (GAUZE/BANDAGES/DRESSINGS)
BENZOIN TINCTURE PRP APPL 2/3 (GAUZE/BANDAGES/DRESSINGS) IMPLANT
BLADE CUDA 5.5 (BLADE) IMPLANT
BLADE CUTTER GATOR 3.5 (BLADE) ×3 IMPLANT
BLADE GREAT WHITE 4.2 (BLADE) IMPLANT
BLADE GREAT WHITE 4.2MM (BLADE)
BLADE SURG 15 STRL LF DISP TIS (BLADE) IMPLANT
BLADE SURG 15 STRL SS (BLADE)
BLADE SURG ROTATE 9660 (MISCELLANEOUS) IMPLANT
BNDG COHESIVE 4X5 TAN STRL (GAUZE/BANDAGES/DRESSINGS) ×3 IMPLANT
BUR OVAL 6.0 (BURR) ×3 IMPLANT
CANISTER SUCT 3000ML (MISCELLANEOUS) ×3 IMPLANT
CANNULA TWIST IN 8.25X7CM (CANNULA) ×6 IMPLANT
CLOSURE WOUND 1/2 X4 (GAUZE/BANDAGES/DRESSINGS)
DECANTER SPIKE VIAL GLASS SM (MISCELLANEOUS) IMPLANT
DRAPE PROXIMA HALF (DRAPES) IMPLANT
DRAPE SHOULDER BEACH CHAIR (DRAPES) ×1 IMPLANT
DRAPE U-SHAPE 47X51 STRL (DRAPES) ×6 IMPLANT
DRSG PAD ABDOMINAL 8X10 ST (GAUZE/BANDAGES/DRESSINGS) ×6 IMPLANT
DURAPREP 26ML APPLICATOR (WOUND CARE) ×3 IMPLANT
ELECT REM PT RETURN 9FT ADLT (ELECTROSURGICAL) ×3
ELECTRODE REM PT RTRN 9FT ADLT (ELECTROSURGICAL) ×1 IMPLANT
GAUZE SPONGE 4X4 12PLY STRL (GAUZE/BANDAGES/DRESSINGS) ×3 IMPLANT
GAUZE XEROFORM 1X8 LF (GAUZE/BANDAGES/DRESSINGS) ×3 IMPLANT
GLOVE BIO SURGEON STRL SZ7 (GLOVE) IMPLANT
GLOVE BIOGEL PI IND STRL 7.0 (GLOVE) IMPLANT
GLOVE BIOGEL PI IND STRL 7.5 (GLOVE) ×1 IMPLANT
GLOVE BIOGEL PI INDICATOR 7.0 (GLOVE)
GLOVE BIOGEL PI INDICATOR 7.5 (GLOVE) ×2
GLOVE SS BIOGEL STRL SZ 7.5 (GLOVE) ×1 IMPLANT
GLOVE SUPERSENSE BIOGEL SZ 7.5 (GLOVE) ×2
GOWN STRL REUS W/ TWL LRG LVL3 (GOWN DISPOSABLE) ×4 IMPLANT
GOWN STRL REUS W/TWL LRG LVL3 (GOWN DISPOSABLE) ×12
KIT BASIN OR (CUSTOM PROCEDURE TRAY) ×3 IMPLANT
LOOP 2 FIBERLINK CLOSED (SUTURE) IMPLANT
MANIFOLD NEPTUNE II (INSTRUMENTS) ×3 IMPLANT
NDL 1/2 CIR CATGUT .05X1.09 (NEEDLE) IMPLANT
NDL SAFETY ECLIPSE 18X1.5 (NEEDLE) ×1 IMPLANT
NDL SCORPION MULTI FIRE (NEEDLE) IMPLANT
NDL SUT 6 .5 CRC .975X.05 MAYO (NEEDLE) IMPLANT
NEEDLE 1/2 CIR CATGUT .05X1.09 (NEEDLE) IMPLANT
NEEDLE HYPO 18GX1.5 SHARP (NEEDLE)
NEEDLE MAYO TAPER (NEEDLE)
NEEDLE SCORPION MULTI FIRE (NEEDLE) ×3 IMPLANT
PACK ARTHROSCOPY DSU (CUSTOM PROCEDURE TRAY) ×3 IMPLANT
PAD ABD 8X10 STRL (GAUZE/BANDAGES/DRESSINGS) ×3 IMPLANT
PENCIL BUTTON HOLSTER BLD 10FT (ELECTRODE) IMPLANT
SET ARTHROSCOPY TUBING (MISCELLANEOUS) ×3
SET ARTHROSCOPY TUBING LN (MISCELLANEOUS) ×1 IMPLANT
SLING ARM FOAM STRAP LRG (SOFTGOODS) IMPLANT
SLING ARM IMMOBILIZER MED (SOFTGOODS) IMPLANT
SLING ARM LRG ADULT FOAM STRAP (SOFTGOODS) IMPLANT
SLING ARM MED ADULT FOAM STRAP (SOFTGOODS) IMPLANT
SLING ARM XL FOAM STRAP (SOFTGOODS) IMPLANT
SPONGE GAUZE 4X4 12PLY STER LF (GAUZE/BANDAGES/DRESSINGS) ×3 IMPLANT
SPONGE LAP 4X18 X RAY DECT (DISPOSABLE) IMPLANT
STRIP CLOSURE SKIN 1/2X4 (GAUZE/BANDAGES/DRESSINGS) IMPLANT
SUCTION FRAZIER TIP 10 FR DISP (SUCTIONS) IMPLANT
SUT ETHILON 3 0 PS 1 (SUTURE) ×3 IMPLANT
SUT FIBERWIRE #2 38 T-5 BLUE (SUTURE)
SUT PDS 2 0 UR 5 (SUTURE) IMPLANT
SUT PROLENE 3 0 PS 2 (SUTURE) IMPLANT
SUT TIGER TAPE 7 IN WHITE (SUTURE) IMPLANT
SUT VIC AB 0 CT1 27 (SUTURE)
SUT VIC AB 0 CT1 27XBRD ANBCTR (SUTURE) IMPLANT
SUT VIC AB 2-0 FS1 27 (SUTURE) IMPLANT
SUT VIC AB 2-0 SH 27 (SUTURE)
SUT VIC AB 2-0 SH 27XBRD (SUTURE) IMPLANT
SUTURE FIBERWR #2 38 T-5 BLUE (SUTURE) IMPLANT
SYR 20CC LL (SYRINGE) IMPLANT
SYR 5ML LL (SYRINGE) ×1 IMPLANT
SYR BULB 3OZ (MISCELLANEOUS) ×3 IMPLANT
TAPE CLOTH SURG 4X10 WHT LF (GAUZE/BANDAGES/DRESSINGS) ×3 IMPLANT
TAPE FIBER 2MM 7IN #2 BLUE (SUTURE) IMPLANT
TAPE STRIPS DRAPE STRL (GAUZE/BANDAGES/DRESSINGS) ×3 IMPLANT
TOWEL OR 17X24 6PK STRL BLUE (TOWEL DISPOSABLE) ×3 IMPLANT
TUBE CONNECTING 20'X1/4 (TUBING) ×1
TUBE CONNECTING 20X1/4 (TUBING) ×1 IMPLANT
WAND STAR VAC 90 (SURGICAL WAND) ×3 IMPLANT
WATER STERILE IRR 1000ML POUR (IV SOLUTION) ×1 IMPLANT

## 2014-05-20 NOTE — Interval H&P Note (Signed)
History and Physical Interval Note:  05/20/2014 11:21 AM  Jessica Howard  has presented today for surgery, with the diagnosis of right shoulder impingement with rotator cuff tear  The various methods of treatment have been discussed with the patient and family. After consideration of risks, benefits and other options for treatment, the patient has consented to  Procedure(s): RIGHT SHOULDER ARTHROSCOPY WITH DEBRIDEMENT/DISTAL CLAVICLE EXCISION/ROTATOR CUFF REPAIR AND SUBACROMIAL DECOMPRESSION (Right) as a surgical intervention .  The patient's history has been reviewed, patient examined, no change in status, stable for surgery.  I have reviewed the patient's chart and labs.  Questions were answered to the patient's satisfaction.     Elsie Saas A

## 2014-05-20 NOTE — Consult Note (Signed)
[facility] Medical Consultation  MERRILLYN ACKERLEY PXT:[id number] DOB: 04/05/1961 DOA: 05/20/2014 PCP: Merrilee Seashore, MD   Requesting physician: Dr. Noemi Chapel  Date of consultation: 05/20/14 Reason for consultation: Medical management  Impression/Recommendations Principal Problem:   Nontraumatic tear of right rotator cuff Active Problems:   Iron deficiency anemia   Thalassemia   Rheumatoid arthritis   Morbid obesity   Renal insufficiency   Back pain   Essential hypertension   Diabetes   Obstructive sleep apnea   Acute on chronic diastolic congestive heart failure, NYHA class 2   Impingement syndrome of right shoulder   Right rotator cuff tear   1. Thalassemia 1. Hgb is stable, currently 9.1, previous hgb of 8.8 2. Pt is followed as outpatient 3. S/p IV iron in 9/15, receiving Aranesp q3 weeks per Hematology 4. Monitor CBC closely 2. RA 1. Seems stable 2. With exception of post-op R shoulder pain, pt denies other joint tenderness. 3. Agree with continuing home prednisone 3. Insulin dependent DM 1. Agree with resuming home insulin regimen of lispro 40 units tid with NPH 75 unitsTID before meals 4. HTN 1. On: chlorthaladone, cardizem, lasix, bystolic 2. Agree with resuming 3. BP currently well controlled 5. OSA 1. Will order cpap for night, if pt can tolerate 6. Chronic diastolic chf 1. On lasix 2. Most recent EF of 55% in 9/15 7. Nontraumatic R rotator cuff tear 1. S/p surgery 2. Per primary service 8. Morbid obesity 1. Stable  I will followup again tomorrow. Please contact me if I can be of assistance in the meanwhile. Thank you for this consultation.  Chief Complaint: R shoulder pain  HPI:  53yo with a hx of htn, insulin dependent DM, rheumatoid arthritis, HLD, OSA who presented to the orthopedic service for surgical management of R rotator cuff tear. Pt experienced mechanical fall two months prior to admission resulting in shoulder injury. Pt had no  reported surgical complications. Given complex medical history, hospitalist was consulted to assist with medical management.  Of note, the patient is followed by Dr. By Dr. Alen Blew (oncology), Dr. Percival Spanish (Cardiology), and Endocrinology as an outpatient.  Review of Systems:  Per above, the remainder of the 10pt ros reviewed and are neg  Past Medical History  Diagnosis Date  . Hypertension   . Hyperlipidemia   . Endometrial polyp   . Fibroadenoma of breast     left  . Hepatic steatosis   . Sleep apnea   . Nontraumatic tear of right rotator cuff   . Impingement syndrome of right shoulder   . Unspecified deficiency anemia     Thalassemia  . Diabetes mellitus     type 2  . Rheumatoid arthritis(714.0)     rhematoid and osteoarthritis  . Diabetic neuropathy   . Asthma   . Pneumonia   . Headache     migraines  . Thyroid disease     had radiation  . GERD (gastroesophageal reflux disease)   . Psoriasis    Past Surgical History  Procedure Laterality Date  . Tubal ligation      bilateral  . Hand surgery      for arthritis  . Tonsillectomy    . Left knee surgery nodule removal    . Other surgical history      multiple nodule removal on neck, hands, left knee  . Dilation and curettage of uterus    . Eye surgery      surgery on retina  . Cesarean section    .  Ankle arthroscopy Right   . Colonoscopy     Social History:  reports that she quit smoking about a year ago. Her smoking use included Cigarettes. She has a 1.5 pack-year smoking history. She has never used smokeless tobacco. She reports that she does not drink alcohol or use illicit drugs.  Allergies  Allergen Reactions  . Bactrim [Sulfamethoxazole-Trimethoprim] Hives  . Cefuroxime Axetil Itching  . Cephalosporins Itching  . Lisinopril Cough  . Penicillins Hives  . Sulfa Antibiotics Hives  . Iohexol Itching and Rash     Code: RASH, Desc: HAD ITCHING AND A RASH ABOUT ONE HOUR AFTER RETURNING HOME FROM THE CT, Onset  Date: [id number]    Family History  Problem Relation Age of Onset  . Diabetes Mother   . Cancer Mother     breast  . Diabetes Sister   . Cancer Brother     bone marrow cancer    Prior to Admission medications   Medication Sig Start Date End Date Taking? Authorizing Provider  abatacept (ORENCIA) 250 MG injection Inject 1,000 mg into the vein every 28 (twenty-eight) days. Monthly Infusion   Yes Historical Provider, MD  amitriptyline (ELAVIL) 25 MG tablet Take 25 mg by mouth at bedtime.    Yes Historical Provider, MD  BD INSULIN SYRINGE ULTRAFINE 31G X 15/64" 1 ML MISC  01/03/14  Yes Historical Provider, MD  chlorthalidone (HYGROTON) 25 MG tablet Take 25 mg by mouth daily.   Yes Historical Provider, MD  CRESTOR 20 MG tablet 20 mg daily.  03/14/14  Yes Historical Provider, MD  diltiazem (CARDIZEM CD) 240 MG 24 hr capsule Take 240 mg by mouth daily.  02/03/14  Yes Historical Provider, MD  fluticasone (FLONASE) 50 MCG/ACT nasal spray Place 1 spray into both nostrils daily as needed for allergies or rhinitis.   Yes Historical Provider, MD  furosemide (LASIX) 20 MG tablet Take 0.5 tablets (10 mg total) by mouth daily. 03/01/14 03/01/15 Yes Sahar Alene Mires, PA-C  gabapentin (NEURONTIN) 300 MG capsule Take 600 mg by mouth 3 (three) times daily.    Yes Historical Provider, MD  HYDROcodone-acetaminophen (NORCO) 5-325 MG per tablet Take 1 tablet by mouth every 6 (six) hours as needed for moderate pain. 03/20/14  Yes Tatyana A Kirichenko, PA-C  insulin lispro (HUMALOG) 100 UNIT/ML injection Inject 40 Units into the skin 3 (three) times daily before meals. Per sliding scale   Yes Historical Provider, MD  insulin NPH (HUMULIN N,NOVOLIN N) 100 UNIT/ML injection Inject 75 Units into the skin 3 (three) times daily with meals. 75 units with breakfast; lunch 75 units; 75 units at dinner   Yes Historical Provider, MD  irbesartan (AVAPRO) 300 MG tablet Take 300 mg by mouth daily.    Yes Historical Provider, MD   Methotrexate Sodium, PF, 50 MG/2ML SOLN Inject 20 mg into the skin once a week. Takes on Sundays   Yes Historical Provider, MD  Nebivolol HCl (BYSTOLIC) 20 MG TABS Take 20 mg by mouth daily. 02/13/12  Yes Historical Provider, MD  ONETOUCH VERIO test strip  02/15/14  Yes Historical Provider, MD  pantoprazole (PROTONIX) 40 MG tablet Take 40 mg by mouth daily.   Yes Historical Provider, MD  predniSONE (DELTASONE) 5 MG tablet Take 5 mg by mouth daily.   Yes Historical Provider, MD  rosuvastatin (CRESTOR) 10 MG tablet Take 10 mg by mouth daily.   Yes Historical Provider, MD  tiZANidine (ZANAFLEX) 4 MG tablet Take 4 mg by mouth 2 (two) times daily  as needed (migraines). Limit 2 days per week 01/12/14  Yes Historical Provider, MD  topiramate (TOPAMAX) 100 MG tablet Take 100 mg by mouth daily.  03/14/14  Yes Historical Provider, MD  VOLTAREN 1 % GEL Apply 1 application topically 3 (three) times daily.  02/03/14  Yes Historical Provider, MD   Physical Exam: Blood pressure 125/62, pulse 63, temperature 98.3 F (36.8 C), temperature source Oral, resp. rate 18, weight 130.636 kg (288 lb), last menstrual period 12/27/2013, SpO2 100 %. Filed Vitals:   05/20/14 1400 05/20/14 1415 05/20/14 1430 05/20/14 1445  BP: 126/50 129/65 131/62 125/62  Pulse: 64 68 65 63  Temp:      TempSrc:      Resp: 19 20 16 18   Weight:      SpO2: 100% 100% 100% 100%     General:  Awake, in nad  Eyes: PERRL B  ENT: membranes moist, dentition fair  Neck: trachea midline, neck supple  Cardiovascular: regular, s1, s2  Respiratory: normal resp effort, no wheezing  Abdomen: morbidly obese, soft, nondistended  Skin: normal skin turgor, no abnormal skin lesions seen  Musculoskeletal: perfused, no clubbing, post-op dressings in place over R shoulder  Psychiatric: mood/affect normal//no auditory/visual hallucinations  Neurologic: cn2-12 grossly intact, strength/sensation intact  Labs on Admission:  Basic Metabolic  Panel:  Recent Labs Lab 05/20/14 0750  NA 139  K 3.9  CL 100  CO2 21  GLUCOSE 157*  BUN 28*  CREATININE 1.01  CALCIUM 9.5   Liver Function Tests:  Recent Labs Lab 05/20/14 0750  AST 31  ALT 22  ALKPHOS 98  BILITOT 0.4  PROT 7.4  ALBUMIN 3.6   No results for input(s): LIPASE, AMYLASE in the last 168 hours. No results for input(s): AMMONIA in the last 168 hours. CBC:  Recent Labs Lab 05/16/14 0846  WBC 11.3*  NEUTROABS 5.4  HGB 9.1*  HCT 29.3*  MCV 69.3*  PLT 307   Cardiac Enzymes: No results for input(s): CKTOTAL, CKMB, CKMBINDEX, TROPONINI in the last 168 hours. BNP: Invalid input(s): POCBNP CBG:  Recent Labs Lab 05/20/14 0816 05/20/14 1035 05/20/14 1422  GLUCAP 162* 163* 214*    Radiological Exams on Admission: No results found.  Time spent: 68min  Vincenta Steffey K [facility] Pager [phone]  If 7PM-7AM, please contact night-coverage [email or web address] Password [email or web address] 05/20/2014, 3:47 PM

## 2014-05-20 NOTE — Anesthesia Procedure Notes (Addendum)
Anesthesia Regional Block:  Interscalene brachial plexus block  Pre-Anesthetic Checklist: ,, timeout performed, Correct Patient, Correct Site, Correct Laterality, Correct Procedure, Correct Position, site marked, Risks and benefits discussed,  Surgical consent,  Pre-op evaluation,  At surgeon's request and post-op pain management  Laterality: Right  Prep: chloraprep       Needles:  Injection technique: Single-shot  Needle Type: Other     Needle Length: 9cm 9 cm Needle Gauge: 22 and 22 G  Needle insertion depth: 5 cm   Additional Needles:  Procedures: ultrasound guided (picture in chart) and nerve stimulator Interscalene brachial plexus block Narrative:  Start time: 05/20/2014 10:05 AM End time: 05/20/2014 10:20 AM Injection made incrementally with aspirations every 5 mL.  Performed by: Personally  Anesthesiologist: Lillia Abed  Additional Notes: Monitors applied. Patient sedated. Sterile prep and drape,hand hygiene and sterile gloves were used. Relevant anatomy identified.Needle position confirmed.Local anesthetic injected incrementally after negative aspiration. Local anesthetic spread visualized around nerve(s). Vascular puncture avoided. No complications. Image printed for medical record.The patient tolerated the procedure well.

## 2014-05-20 NOTE — Progress Notes (Signed)
Dr. Noemi Chapel notified that thigh hi hose will not fit due to size of thigh,okay to use knee hi's.

## 2014-05-20 NOTE — Transfer of Care (Signed)
Immediate Anesthesia Transfer of Care Note  Patient: Jessica Howard  Procedure(s) Performed: Procedure(s): RIGHT SHOULDER ARTHROSCOPY WITH DEBRIDEMENT/DISTAL CLAVICLE EXCISION/ROTATOR CUFF REPAIR AND SUBACROMIAL DECOMPRESSION (Right)  Patient Location: PACU  Anesthesia Type:General  Level of Consciousness: awake, alert  and oriented  Airway & Oxygen Therapy: Patient Spontanous Breathing  Post-op Assessment: Report given to PACU RN  Post vital signs: Reviewed and stable  Complications: No apparent anesthesia complications

## 2014-05-20 NOTE — Anesthesia Preprocedure Evaluation (Addendum)
Anesthesia Evaluation  Patient identified by MRN, date of birth, ID band  Reviewed: Allergy & Precautions, H&P , NPO status , Patient's Chart, lab work & pertinent test results  Airway Mallampati: II  TM Distance: >3 FB Neck ROM: Full    Dental   Pulmonary shortness of breath and with exertion, sleep apnea , former smoker,  Discussed with patient the possibility of needing post op ventilation. I feel she should get an interscalene block for post op pain. She agreed after risks and benefits were explained to her and her sister.         Cardiovascular hypertension,     Neuro/Psych    GI/Hepatic   Endo/Other  diabetes  Renal/GU      Musculoskeletal   Abdominal   Peds  Hematology   Anesthesia Other Findings   Reproductive/Obstetrics                            Anesthesia Physical Anesthesia Plan  ASA: III  Anesthesia Plan: General   Post-op Pain Management:    Induction: Intravenous  Airway Management Planned: Oral ETT  Additional Equipment:   Intra-op Plan:   Post-operative Plan: Extubation in OR  Informed Consent: I have reviewed the patients History and Physical, chart, labs and discussed the procedure including the risks, benefits and alternatives for the proposed anesthesia with the patient or authorized representative who has indicated his/her understanding and acceptance.     Plan Discussed with: CRNA and Surgeon  Anesthesia Plan Comments:         Anesthesia Quick Evaluation

## 2014-05-20 NOTE — Anesthesia Postprocedure Evaluation (Signed)
  Anesthesia Post-op Note  Patient: Jessica Howard  Procedure(s) Performed: Procedure(s): RIGHT SHOULDER ARTHROSCOPY WITH DEBRIDEMENT/DISTAL CLAVICLE EXCISION/ROTATOR CUFF REPAIR AND SUBACROMIAL DECOMPRESSION (Right)  Patient Location: PACU  Anesthesia Type:General  Level of Consciousness: awake  Airway and Oxygen Therapy: Patient Spontanous Breathing  Post-op Pain: mild  Post-op Assessment: Post-op Vital signs reviewed  Post-op Vital Signs: Reviewed  Last Vitals:  Filed Vitals:   05/20/14 1615  BP:   Pulse: 65  Temp: 36.4 C  Resp: 12    Complications: No apparent anesthesia complications

## 2014-05-21 DIAGNOSIS — M75101 Unspecified rotator cuff tear or rupture of right shoulder, not specified as traumatic: Secondary | ICD-10-CM | POA: Diagnosis not present

## 2014-05-21 DIAGNOSIS — M75121 Complete rotator cuff tear or rupture of right shoulder, not specified as traumatic: Secondary | ICD-10-CM | POA: Diagnosis not present

## 2014-05-21 LAB — CBC
HCT: 28.2 % — ABNORMAL LOW (ref 36.0–46.0)
HEMOGLOBIN: 8.6 g/dL — AB (ref 12.0–15.0)
MCH: 21 pg — AB (ref 26.0–34.0)
MCHC: 30.5 g/dL (ref 30.0–36.0)
MCV: 68.8 fL — ABNORMAL LOW (ref 78.0–100.0)
Platelets: 278 10*3/uL (ref 150–400)
RBC: 4.1 MIL/uL (ref 3.87–5.11)
RDW: 17.5 % — AB (ref 11.5–15.5)
WBC: 10.3 10*3/uL (ref 4.0–10.5)

## 2014-05-21 LAB — BASIC METABOLIC PANEL
ANION GAP: 13 (ref 5–15)
BUN: 21 mg/dL (ref 6–23)
CALCIUM: 8.8 mg/dL (ref 8.4–10.5)
CO2: 22 mEq/L (ref 19–32)
Chloride: 104 mEq/L (ref 96–112)
Creatinine, Ser: 0.99 mg/dL (ref 0.50–1.10)
GFR calc Af Amer: 75 mL/min — ABNORMAL LOW (ref >90)
GFR calc non Af Amer: 65 mL/min — ABNORMAL LOW (ref >90)
Glucose, Bld: 148 mg/dL — ABNORMAL HIGH (ref 70–99)
POTASSIUM: 4 meq/L (ref 3.7–5.3)
SODIUM: 139 meq/L (ref 137–147)

## 2014-05-21 LAB — GLUCOSE, CAPILLARY: GLUCOSE-CAPILLARY: 178 mg/dL — AB (ref 70–99)

## 2014-05-21 MED ORDER — DSS 100 MG PO CAPS: ORAL_CAPSULE | ORAL | Status: DC

## 2014-05-21 MED ORDER — HYDROCODONE-ACETAMINOPHEN 10-325 MG PO TABS: 1.0000 | ORAL_TABLET | ORAL | Status: DC | PRN

## 2014-05-21 MED ORDER — POLYETHYLENE GLYCOL 3350 17 G PO PACK: 17.0000 g | PACK | Freq: Two times a day (BID) | ORAL | Status: DC

## 2014-05-21 NOTE — Discharge Summary (Signed)
Patient ID: ALINA GILKEY MRN: [id number] DOB/AGE: 1961/04/19 53 y.o.  Admit date: 05/20/2014 Discharge date: 05/21/2014  Admission Diagnoses:  Principal Problem:   Nontraumatic tear of right rotator cuff Active Problems:   Iron deficiency anemia   Thalassemia   Rheumatoid arthritis   Morbid obesity   Renal insufficiency   Back pain   Essential hypertension   Diabetes   Obstructive sleep apnea   Acute on chronic diastolic congestive heart failure, NYHA class 2   Impingement syndrome of right shoulder   Right rotator cuff tear   Discharge Diagnoses:  Same  Past Medical History  Diagnosis Date  . Hypertension   . Hyperlipidemia   . Endometrial polyp   . Fibroadenoma of breast     left  . Hepatic steatosis   . Sleep apnea   . Nontraumatic tear of right rotator cuff   . Impingement syndrome of right shoulder   . Unspecified deficiency anemia     Thalassemia  . Diabetes mellitus     type 2  . Rheumatoid arthritis(714.0)     rhematoid and osteoarthritis  . Diabetic neuropathy   . Asthma   . Pneumonia   . Headache     migraines  . Thyroid disease     had radiation  . GERD (gastroesophageal reflux disease)   . Psoriasis     Surgeries: Procedure(s): RIGHT SHOULDER ARTHROSCOPY WITH DEBRIDEMENT/DISTAL CLAVICLE EXCISION/ROTATOR CUFF REPAIR AND SUBACROMIAL DECOMPRESSION on 05/20/2014   Consultants:    Discharged Condition: Improved  Hospital Course: LAMIA MARINER is an 53 y.o. female who was admitted 05/20/2014 for operative treatment ofNontraumatic tear of right rotator cuff. Patient has severe unremitting pain that affects sleep, daily activities, and work/hobbies. After pre-op clearance the patient was taken to the operating room on 05/20/2014 and underwent  Procedure(s): RIGHT SHOULDER ARTHROSCOPY WITH DEBRIDEMENT/DISTAL CLAVICLE EXCISION/ROTATOR CUFF REPAIR AND SUBACROMIAL DECOMPRESSION.    Patient was given perioperative antibiotics: Anti-infectives     Start     Dose/Rate Route Frequency Ordered Stop   05/20/14 2200  vancomycin (VANCOCIN) IVPB 1000 mg/200 mL premix     1,000 mg200 mL/hr over 60 Minutes Intravenous Every 12 hours 05/20/14 1452 05/20/14 2255   05/20/14 0745  vancomycin (VANCOCIN) 1,500 mg in sodium chloride 0.9 % 500 mL IVPB  Status:  Discontinued     1,500 mg250 mL/hr over 120 Minutes Intravenous On call to O.R. 05/20/14 0740 05/20/14 1826       Patient was given sequential compression devices, early ambulation, and chemoprophylaxis to prevent DVT.  Post operatively she was a delayed extubation due to low tidal volumes.  She was extubated in the PACU.  She was placed on step down for overnight observation due to multiple medical issues.  She was stable over night with pain controlled on PO pain meds and 02 sats maintained on 2 liters.  In the am she was able to maintain sats on room air and was discharged to home in stable condition  Patient benefited maximally from hospital stay and there were no complications.    Recent vital signs: Patient Vitals for the past 24 hrs:  BP Temp Temp src Pulse Resp SpO2 Height Weight  05/21/14 0745 112/63 mmHg 97.9 F (36.6 C) Oral 79 15 97 % - -  05/21/14 0412 132/75 mmHg 97.5 F (36.4 C) Oral 77 12 97 % - -  05/20/14 2336 109/62 mmHg 97.3 F (36.3 C) Oral 75 18 100 % - -  05/20/14 1853 - - - - - -  5\' 3"  (1.6 m) (!) 139.7 kg (307 lb 15.7 oz)  05/20/14 1849 (!) 101/53 mmHg - - 72 (!) 23 99 % - -  05/20/14 1815 - 97.6 F (36.4 C) - 70 12 100 % - -  05/20/14 1800 (!) 126/53 mmHg - - 66 14 100 % - -  05/20/14 1745 - - - 66 13 96 % - -  05/20/14 1730 109/89 mmHg - - 71 15 100 % - -  05/20/14 1715 - - - 66 13 99 % - -  05/20/14 1700 109/65 mmHg - - 65 20 98 % - -  05/20/14 1645 - - - 72 (!) 23 94 % - -  05/20/14 1630 (!) 109/58 mmHg - - 63 12 97 % - -  05/20/14 1615 - 97.5 F (36.4 C) - 65 12 99 % - -  05/20/14 1600 (!) 146/65 mmHg - - 70 19 99 % - -  05/20/14 1545 - - - 67 (!) 23  100 % - -  05/20/14 1530 (!) 129/57 mmHg - - 64 15 100 % - -  05/20/14 1515 - - - 64 17 100 % - -  05/20/14 1500 122/61 mmHg - - 64 15 100 % - -  05/20/14 1445 125/62 mmHg - - 63 18 100 % - -  05/20/14 1430 131/62 mmHg - - 65 16 100 % - -  05/20/14 1415 129/65 mmHg - - 68 20 100 % - -  05/20/14 1400 (!) 126/50 mmHg - - 64 19 100 % - -  05/20/14 1345 (!) 124/49 mmHg - - 64 20 100 % - -  05/20/14 1330 (!) 103/53 mmHg 98.3 F (36.8 C) - 66 (!) 23 100 % - -  05/20/14 1115 (!) 132/58 mmHg - - 79 - 98 % - -  05/20/14 1100 (!) 143/52 mmHg - - 78 18 100 % - -  05/20/14 1045 (!) 157/64 mmHg - - 81 - 100 % - -  05/20/14 1040 130/63 mmHg - - 81 - 99 % - -  05/20/14 1035 (!) 88/56 mmHg - - 86 - 99 % - -  05/20/14 1030 (!) 110/45 mmHg - - 88 - 99 % - -  05/20/14 1026 128/61 mmHg - - - - - - -  05/20/14 1000 115/60 mmHg - - 71 15 100 % - -  05/20/14 0955 - - - 70 17 100 % - -  05/20/14 0952 (!) 108/59 mmHg - - 73 17 99 % - -  05/20/14 0950 (!) 84/57 mmHg - - 71 18 100 % - -  05/20/14 0945 (!) 137/110 mmHg - - 72 17 100 % - -  05/20/14 0940 - - - 71 (!) 22 100 % - -  05/20/14 0935 - - - 73 18 100 % - -  05/20/14 0930 - - - 72 (!) 22 100 % - -  05/20/14 0917 - - - 71 16 100 % - -     Recent laboratory studies:  Recent Labs  05/20/14 0750 05/20/14 2128 05/21/14 0329  WBC  --  8.9 10.3  HGB  --  8.6* 8.6*  HCT  --  27.8* 28.2*  PLT  --  287 278  NA 139  --  139  K 3.9  --  4.0  CL 100  --  104  CO2 21  --  22  BUN 28*  --  21  CREATININE 1.01 0.99 0.99  GLUCOSE 157*  --  148*  CALCIUM 9.5  --  8.8     Discharge Medications:     Medication List    STOP taking these medications        HYDROcodone-acetaminophen 5-325 MG per tablet  Commonly known as:  NORCO  Replaced by:  HYDROcodone-acetaminophen 10-325 MG per tablet     Methotrexate Sodium (PF) 50 MG/2ML Soln      TAKE these medications        amitriptyline 25 MG tablet  Commonly known as:  ELAVIL  Take 25 mg by mouth  at bedtime.     BD INSULIN SYRINGE ULTRAFINE 31G X 15/64" 1 ML Misc  Generic drug:  Insulin Syringe-Needle R-604     BYSTOLIC 20 MG Tabs  Generic drug:  Nebivolol HCl  Take 20 mg by mouth daily.     chlorthalidone 25 MG tablet  Commonly known as:  HYGROTON  Take 25 mg by mouth daily.     CRESTOR 20 MG tablet  Generic drug:  rosuvastatin  20 mg daily.     diltiazem 240 MG 24 hr capsule  Commonly known as:  CARDIZEM CD  Take 240 mg by mouth daily.     DSS 100 MG Caps  1 tab 2 times a day while on narcotics.  STOOL SOFTENER     fluticasone 50 MCG/ACT nasal spray  Commonly known as:  FLONASE  Place 1 spray into both nostrils daily as needed for allergies or rhinitis.     furosemide 20 MG tablet  Commonly known as:  LASIX  Take 0.5 tablets (10 mg total) by mouth daily.     gabapentin 300 MG capsule  Commonly known as:  NEURONTIN  Take 600 mg by mouth 3 (three) times daily.     HYDROcodone-acetaminophen 10-325 MG per tablet  Commonly known as:  NORCO  Take 1 tablet by mouth every 4 (four) hours as needed for moderate pain.     insulin lispro 100 UNIT/ML injection  Commonly known as:  HUMALOG  Inject 40 Units into the skin 3 (three) times daily before meals. Per sliding scale     insulin NPH Human 100 UNIT/ML injection  Commonly known as:  HUMULIN N,NOVOLIN N  Inject 75 Units into the skin 3 (three) times daily with meals. 75 units with breakfast; lunch 75 units; 75 units at dinner     irbesartan 300 MG tablet  Commonly known as:  AVAPRO  Take 300 mg by mouth daily.     ONETOUCH VERIO test strip  Generic drug:  glucose blood     ORENCIA 250 MG injection  Generic drug:  abatacept  Inject 1,000 mg into the vein every 28 (twenty-eight) days. Monthly Infusion     pantoprazole 40 MG tablet  Commonly known as:  PROTONIX  Take 40 mg by mouth daily.     polyethylene glycol packet  Commonly known as:  MIRALAX / GLYCOLAX  Take 17 g by mouth 2 (two) times daily.      predniSONE 5 MG tablet  Commonly known as:  DELTASONE  Take 5 mg by mouth daily.     tiZANidine 4 MG tablet  Commonly known as:  ZANAFLEX  Take 4 mg by mouth 2 (two) times daily as needed (migraines). Limit 2 days per week     topiramate 100 MG tablet  Commonly known as:  TOPAMAX  Take 100 mg by mouth daily.     VOLTAREN 1 % Gel  Generic drug:  diclofenac sodium  Apply 1 application topically 3 (three) times daily.        Diagnostic Studies: No results found.  Disposition: 01-Home or Self Care      Discharge Instructions    Call MD / Call 911    Complete by:  As directed   If you experience chest pain or shortness of breath, CALL 911 and be transported to the hospital emergency room.  If you develope a fever above 101 F, pus (white drainage) or increased drainage or redness at the wound, or calf pain, call your surgeon's office.     Constipation Prevention    Complete by:  As directed   Drink plenty of fluids.  Prune juice may be helpful.  You may use a stool softener, such as Colace (over the counter) 100 mg twice a day.  Use MiraLax (over the counter) for constipation as needed.     Diet Carb Modified    Complete by:  As directed      Discharge instructions    Complete by:  As directed   Shouder arthroscopy, rotator cuff repair, subacromial decompression Care After Instructions Refer to this sheet in the next few weeks. These discharge instructions provide you with general information on caring for yourself after you leave the hospital. Your caregiver may also give you specific instructions. Your treatment has been planned according to the most current medical practices available, but unavoidable complications sometimes occur. If you have any problems or questions after discharge, please call your caregiver. HOME INSTRUCTIONS You may resume a normal diet and activities as directed. Perform pendulum exercises as directed. You will start physical therapy 2-4 days after  surgery Take showers instead of baths until informed otherwise.  Change bandages (dressings) in 3 days.  Swab wounds daily with betadine.  Wash shoulder with soap and water.  Pat dry.  Cover wounds with bandaids. Only take over-the-counter or prescription medicines for pain, discomfort, or fever as directed by your caregiver.  Wear your sling for the next 6 weeks unless otherwise instructed. Eat a well-balanced diet.  Avoid lifting or driving until you are instructed otherwise.  Make an appointment to see your caregiver for stitches (suture) or staple removal as directed.   SEEK MEDICAL CARE IF: You have swelling of your calf or leg.  You develop shortness of breath or chest pain.  You have redness, swelling, or increasing pain in the wound.  There is pus or any unusual drainage coming from the surgical site.  You notice a bad smell coming from the surgical site or dressing.  The surgical site breaks open after sutures or staples have been removed.  There is persistent bleeding from the suture or staple line.  You are getting worse or are not improving.  You have any other questions or concerns.  SEEK IMMEDIATE MEDICAL CARE IF:  You have a fever greater than 101 You develop a rash.  You have difficulty breathing.  You develop any reaction or side effects to medicines given.  Your knee motion is decreasing rather than improving.  MAKE SURE YOU:  Understand these instructions.  Will watch your condition.  Will get help right away if you are not doing well or get worse.     Increase activity slowly as tolerated    Complete by:  As directed            Follow-up Information    Follow up with Lorn Junes, MD On 05/26/2014.   Specialty:  Orthopedic Surgery  Why:  appt time 10:30 for stitches out   Contact information:   [place] [address] [phone]       Follow up with Lorn Junes, MD On 05/23/2014.   Specialty:  Orthopedic Surgery    Why:  Physical therapy office will call this weekend with appt time   Contact information:   [name] [address] [phone]        Signed: Linda Hedges 05/21/2014, 9:16 AM

## 2014-05-21 NOTE — Progress Notes (Signed)
Discharge instructions given to patient and son at bedside both verbalized understanding.  Prescriptions given to patients son.  Patient is stable at discharge.

## 2014-05-21 NOTE — Progress Notes (Signed)
Consult note                                            Patient Demographics  Jessica Howard, is a 53 y.o. female, DOB - 07-13-1960, MWU:[id number]  Admit date - 05/20/2014   Admitting Physician Lorn Junes, MD  Outpatient Primary MD for the patient is [facility], MD  LOS - 1   No chief complaint on file.       Subjective:   [name] today has, No headache, No chest pain, No abdominal pain - No Nausea, No new weakness tingling or numbness, No Cough - SOB. Ongoing right shoulder pain  Assessment & Plan    1. Right shoulder rotator cuff injury. Status post surgical correction by orthopedics which is the primary team on 05/20/2014. Has tolerated procedure well. We'll defer further management of this problem to the primary team. Increase activity. PT eval ordered.   2.DM2 - on Lantus and ISS  Lab Results  Component Value Date   HGBA1C 8.8* 05/20/2014    CBG (last 3)   Recent Labs  05/20/14 1422 05/20/14 2142 05/21/14 0751  GLUCAP 214* 207* 178*     3. History of thalassemia. IMA globin stable. No acute intervention. Continue IV iron and Aranesp every 3 weeks per hematology outpatient.   4. Morbid obesity with obstructive sleep apnea. Follow-up with PCP, C-peptide daily at bedtime.   5. Chronic diastolic CHF. She is compensated. EF 55% on recent echo. Stop IV fluids. Continue beta blocker and diltiazem.   6. Essential hypertension. Stable blood pressure, pain control, continue Cardizem, Lasix, Bystolic.      7.RA - no acute issues on home dose prednisone. If blood pressure drops will consider stress dose steroids.       Medications  Scheduled Meds: . [START ON 06/01/2014] abatacept (ORENCIA) IV  1,000 mg Intravenous Q28 days  . amitriptyline   25 mg Oral QHS  . chlorthalidone  25 mg Oral Daily  . diltiazem  240 mg Oral Daily  . docusate sodium  100 mg Oral BID  . enoxaparin (LOVENOX) injection  40 mg Subcutaneous Q24H  . furosemide  10 mg Oral Daily  . gabapentin  600 mg Oral TID  . insulin aspart  0-5 Units Subcutaneous QHS  . insulin aspart  40-60 Units Subcutaneous TID WC  . insulin NPH Human  75 Units Subcutaneous TID WC  . irbesartan  300 mg Oral Daily  . nebivolol  20 mg Oral Daily  . pantoprazole  40 mg Oral Daily  . polyethylene glycol  17 g Oral BID  . predniSONE  5 mg Oral Daily  . rosuvastatin  20 mg Oral Daily  . topiramate  100 mg Oral Daily   Continuous Infusions:  PRN Meds:.acetaminophen **OR** [DISCONTINUED] acetaminophen, fluticasone, HYDROcodone-acetaminophen, HYDROmorphone (DILAUDID) injection, [DISCONTINUED] ondansetron **OR** ondansetron (ZOFRAN) IV, oxyCODONE, tiZANidine  DVT Prophylaxis  Lovenox    Lab Results  Component Value Date   PLT 278 05/21/2014    Antibiotics     Anti-infectives    Start     Dose/Rate Route Frequency  Ordered Stop   05/20/14 2200  vancomycin (VANCOCIN) IVPB 1000 mg/200 mL premix     1,000 mg200 mL/hr over 60 Minutes Intravenous Every 12 hours 05/20/14 1452 05/20/14 2255   05/20/14 0745  vancomycin (VANCOCIN) 1,500 mg in sodium chloride 0.9 % 500 mL IVPB  Status:  Discontinued     1,500 mg250 mL/hr over 120 Minutes Intravenous On call to O.R. 05/20/14 0740 05/20/14 1826          Objective:   Filed Vitals:   05/20/14 1853 05/20/14 2336 05/21/14 0412 05/21/14 0745  BP:  109/62 132/75 112/63  Pulse:  75 77 79  Temp:  97.3 F (36.3 C) 97.5 F (36.4 C) 97.9 F (36.6 C)  TempSrc:  Oral Oral Oral  Resp:  18 12 15   Height: 5\' 3"  (1.6 m)     Weight: 139.7 kg (307 lb 15.7 oz)     SpO2:  100% 97% 97%    Wt Readings from Last 3 Encounters:  05/20/14 139.7 kg (307 lb 15.7 oz)  05/04/14 130.636 kg (288 lb)  04/20/14 131.044 kg (288 lb 14.4 oz)      Intake/Output Summary (Last 24 hours) at 05/21/14 0952 Last data filed at 05/21/14 0420  Gross per 24 hour  Intake   1780 ml  Output    850 ml  Net    930 ml     Physical Exam  Awake Alert, Oriented X 3, No new F.N deficits, Normal affect [place].AT,PERRAL Supple Neck,No JVD, No cervical lymphadenopathy appriciated.  Symmetrical Chest wall movement, Good air movement bilaterally, CTAB RRR,No Gallops,Rubs or new Murmurs, No Parasternal Heave +ve B.Sounds, Abd Soft, No tenderness, No organomegaly appriciated, No rebound - guarding or rigidity. No Cyanosis, Clubbing or edema, No new Rash or bruise, right shoulder in bandage   Data Review   Micro Results Recent Results (from the past 240 hour(s))  Surgical pcr screen     Status: Abnormal   Collection Time: 05/20/14  8:04 AM  Result Value Ref Range Status   MRSA, PCR NEGATIVE NEGATIVE Final   Staphylococcus aureus POSITIVE (A) NEGATIVE Final    Comment:        The Xpert SA Assay (FDA approved for NASAL specimens in patients over 29 years of age), is one component of a comprehensive surveillance program.  Test performance has been validated by [employer] for patients greater than or equal to 28 year old. It is not intended to diagnose infection nor to guide or monitor treatment.     Radiology Reports No results found.   CBC  Recent Labs Lab 05/16/14 0846 05/20/14 2128 05/21/14 0329  WBC 11.3* 8.9 10.3  HGB 9.1* 8.6* 8.6*  HCT 29.3* 27.8* 28.2*  PLT 307 287 278  MCV 69.3* 68.5* 68.8*  MCH 21.5* 21.2* 21.0*  MCHC 31.1* 30.9 30.5  RDW 17.7* 17.4* 17.5*  LYMPHSABS 4.6*  --   --   MONOABS 1.2*  --   --   EOSABS 0.1  --   --   BASOSABS 0.0  --   --     Chemistries   Recent Labs Lab 05/20/14 0750 05/20/14 2128 05/21/14 0329  NA 139  --  139  K 3.9  --  4.0  CL 100  --  104  CO2 21  --  22  GLUCOSE 157*  --  148*  BUN 28*  --  21  CREATININE 1.01 0.99 0.99  CALCIUM 9.5  --  8.8  AST 31  --    --  ALT 22  --   --   ALKPHOS 98  --   --   BILITOT 0.4  --   --    ------------------------------------------------------------------------------------------------------------------ estimated creatinine clearance is 92.7 mL/min (by C-G formula based on Cr of 0.99). ------------------------------------------------------------------------------------------------------------------  Recent Labs  05/20/14 0750  HGBA1C 8.8*   ------------------------------------------------------------------------------------------------------------------ No results for input(s): CHOL, HDL, LDLCALC, TRIG, CHOLHDL, LDLDIRECT in the last 72 hours. ------------------------------------------------------------------------------------------------------------------ No results for input(s): TSH, T4TOTAL, T3FREE, THYROIDAB in the last 72 hours.  Invalid input(s): FREET3 ------------------------------------------------------------------------------------------------------------------ No results for input(s): VITAMINB12, FOLATE, FERRITIN, TIBC, IRON, RETICCTPCT in the last 72 hours.  Coagulation profile No results for input(s): INR, PROTIME in the last 168 hours.  No results for input(s): DDIMER in the last 72 hours.  Cardiac Enzymes No results for input(s): CKMB, TROPONINI, MYOGLOBIN in the last 168 hours.  Invalid input(s): CK ------------------------------------------------------------------------------------------------------------------ Invalid input(s): POCBNP     Time Spent in minutes  35   Carrye Goller K M.D on 05/21/2014 at 9:52 AM  Between 7am to 7pm - Pager - [phone]  After 7pm go to [email or web address] - [facility] Office  [phone]

## 2014-05-22 NOTE — Op Note (Signed)
Jessica Howard, Jessica Howard             ACCOUNT NO.:  [id number]  MEDICAL RECORD NO.:  [id number]  LOCATION:  3S02C                        FACILITY:  [facility]  PHYSICIAN:  Audree Camel. Noemi Chapel, M.D. DATE OF BIRTH:  Sep 21, 1960  DATE OF PROCEDURE:  05/20/2014 DATE OF DISCHARGE:  05/21/2014                              OPERATIVE REPORT   PREOPERATIVE DIAGNOSES: 1. Right shoulder chronic nontraumatic rotator cuff tear. 2. Right shoulder chronic nontraumatic partial labrum tear. 3. Right shoulder chronic nontraumatic impingement.  POSTOPERATIVE DIAGNOSES: 1. Right shoulder chronic nontraumatic rotator cuff tear. 2. Right shoulder chronic nontraumatic partial labrum tear. 3. Right shoulder chronic nontraumatic impingement.  PROCEDURE: 1. Right shoulder exam under anesthesia followed by arthroscopically-     assisted rotator cuff repair using Arthrex SwiveLock anchors x2     with fiber tape x2. 2. Right shoulder labrum tear debridement. 3. Right shoulder subacromial decompression.  SURGEON:  Audree Camel. Noemi Chapel, M.D.  ASSISTANT:  Kirstin Shepperson, PA-C.  ANESTHESIA:  General.  OPERATIVE TIME:  1 hour.  COMPLICATIONS:  None.  INDICATION FOR PROCEDURE:  Jessica Howard is a 52 year old woman who has had right shoulder pain for over a year.  Exam and MRI has revealed a complete rotator cuff tear, partial labrum tear with impingement.  She has failed conservative care and is now to undergo arthroscopy and repair.  DESCRIPTION:  Jessica Howard was brought into the operating room on May 20, 2014, after an interscalene block was placed in the holding room by Anesthesia.  She was placed on operative table in supine position.  She received antibiotics preoperatively for prophylaxis. After being placed under general anesthesia, her right shoulder was examined.  She had full range of motion, shoulder was stable to ligamentous exam.  She was then placed in beach-chair position and her shoulder and  arm were prepped using sterile DuraPrep and draped using sterile technique.  Time-out procedure was called and the correct right shoulder identified.  Initially, through a posterior arthroscopic portal, the arthroscope with a pump attached was placed into an anterior portal and arthroscopic probe was placed.  On initial inspection, the articular cartilage in the glenohumeral joint was intact.  She had partial tearing of the anterior, superior, and posterior labrum 25%, which was debrided.  The anterior-inferior glenohumeral ligament complex was intact.  Biceps tendon anchor and biceps tendon was intact.  Rotator cuff showed a complete tear of the supraspinatus and the anterior 50% of the infraspinatus.  Partial tear of the subscapularis 25%, which was debrided.  Teres minor was intact.  Inferior capsular recess was free of pathology.  Subacromial space was entered and a lateral arthroscopic portal was made.  Moderately thickened bursitis was resected. Impingement was noted and a subacromial decompression was carried out as well as CA ligament release.  The [facility] joint was not pathologic and thus was not resected.  At this point, through an accessory lateral portal, the rotator cuff tear was repaired primarily with 2 Arthrex SwiveLock anchors and 2 fiber tapes, each one placed in a mattress suture technique and then the SwiveLock was deployed anterolateral and posterolateral in the greater tuberosity with firm and tight fixation. After this was done, it was found to  be anatomic repair.  Shoulder could be brought through a full range of motion with no impingement on the repair.  At this point, it was felt that all pathology had been satisfactorily addressed.  The instruments were removed.  Portals were closed with 3-0 nylon suture.  Sterile dressings were applied and a sling.  At this point, it was elected by Anesthesia to keep Jessica Howard intubated due to her multiple medical problems even  though she had been very stable during the surgery.  She was taken to the recovery room, intubated, and stable to be extubated in the recovery room.  Needle and sponge counts were correct x2 at the end of the case.     Kateryn Marasigan A. Noemi Chapel, M.D.     RAW/MEDQ  D:  05/22/2014  T:  05/22/2014  Job:  [id number]

## 2014-05-23 ENCOUNTER — Encounter (HOSPITAL_COMMUNITY): Payer: Self-pay | Admitting: Orthopedic Surgery

## 2014-05-25 DIAGNOSIS — M25511 Pain in right shoulder: Secondary | ICD-10-CM | POA: Diagnosis not present

## 2014-05-25 DIAGNOSIS — M6281 Muscle weakness (generalized): Secondary | ICD-10-CM | POA: Diagnosis not present

## 2014-05-25 DIAGNOSIS — M75121 Complete rotator cuff tear or rupture of right shoulder, not specified as traumatic: Secondary | ICD-10-CM | POA: Diagnosis not present

## 2014-05-25 DIAGNOSIS — M25611 Stiffness of right shoulder, not elsewhere classified: Secondary | ICD-10-CM | POA: Diagnosis not present

## 2014-05-27 DIAGNOSIS — M75121 Complete rotator cuff tear or rupture of right shoulder, not specified as traumatic: Secondary | ICD-10-CM | POA: Diagnosis not present

## 2014-05-27 DIAGNOSIS — M25511 Pain in right shoulder: Secondary | ICD-10-CM | POA: Diagnosis not present

## 2014-05-27 DIAGNOSIS — M6281 Muscle weakness (generalized): Secondary | ICD-10-CM | POA: Diagnosis not present

## 2014-05-27 DIAGNOSIS — M25611 Stiffness of right shoulder, not elsewhere classified: Secondary | ICD-10-CM | POA: Diagnosis not present

## 2014-05-31 DIAGNOSIS — M25511 Pain in right shoulder: Secondary | ICD-10-CM | POA: Diagnosis not present

## 2014-05-31 DIAGNOSIS — M75121 Complete rotator cuff tear or rupture of right shoulder, not specified as traumatic: Secondary | ICD-10-CM | POA: Diagnosis not present

## 2014-05-31 DIAGNOSIS — M25611 Stiffness of right shoulder, not elsewhere classified: Secondary | ICD-10-CM | POA: Diagnosis not present

## 2014-05-31 DIAGNOSIS — M6281 Muscle weakness (generalized): Secondary | ICD-10-CM | POA: Diagnosis not present

## 2014-06-08 ENCOUNTER — Ambulatory Visit (HOSPITAL_BASED_OUTPATIENT_CLINIC_OR_DEPARTMENT_OTHER): Payer: Medicare Other

## 2014-06-08 ENCOUNTER — Other Ambulatory Visit (HOSPITAL_BASED_OUTPATIENT_CLINIC_OR_DEPARTMENT_OTHER): Payer: Medicare Other

## 2014-06-08 DIAGNOSIS — M6281 Muscle weakness (generalized): Secondary | ICD-10-CM | POA: Diagnosis not present

## 2014-06-08 DIAGNOSIS — D569 Thalassemia, unspecified: Secondary | ICD-10-CM

## 2014-06-08 DIAGNOSIS — D638 Anemia in other chronic diseases classified elsewhere: Secondary | ICD-10-CM

## 2014-06-08 DIAGNOSIS — N289 Disorder of kidney and ureter, unspecified: Secondary | ICD-10-CM

## 2014-06-08 DIAGNOSIS — D568 Other thalassemias: Secondary | ICD-10-CM

## 2014-06-08 DIAGNOSIS — M069 Rheumatoid arthritis, unspecified: Secondary | ICD-10-CM

## 2014-06-08 DIAGNOSIS — M25511 Pain in right shoulder: Secondary | ICD-10-CM | POA: Diagnosis not present

## 2014-06-08 DIAGNOSIS — M25611 Stiffness of right shoulder, not elsewhere classified: Secondary | ICD-10-CM | POA: Diagnosis not present

## 2014-06-08 DIAGNOSIS — M75121 Complete rotator cuff tear or rupture of right shoulder, not specified as traumatic: Secondary | ICD-10-CM | POA: Diagnosis not present

## 2014-06-08 DIAGNOSIS — D509 Iron deficiency anemia, unspecified: Secondary | ICD-10-CM

## 2014-06-08 DIAGNOSIS — D508 Other iron deficiency anemias: Secondary | ICD-10-CM

## 2014-06-08 LAB — CBC WITH DIFFERENTIAL/PLATELET
BASO%: 0.7 % (ref 0.0–2.0)
Basophils Absolute: 0.1 10*3/uL (ref 0.0–0.1)
EOS ABS: 0.1 10*3/uL (ref 0.0–0.5)
EOS%: 1.2 % (ref 0.0–7.0)
HEMATOCRIT: 31.8 % — AB (ref 34.8–46.6)
HEMOGLOBIN: 9.5 g/dL — AB (ref 11.6–15.9)
LYMPH%: 37.9 % (ref 14.0–49.7)
MCH: 21.1 pg — AB (ref 25.1–34.0)
MCHC: 29.8 g/dL — ABNORMAL LOW (ref 31.5–36.0)
MCV: 70.8 fL — AB (ref 79.5–101.0)
MONO#: 1 10*3/uL — ABNORMAL HIGH (ref 0.1–0.9)
MONO%: 11.8 % (ref 0.0–14.0)
NEUT%: 48.4 % (ref 38.4–76.8)
NEUTROS ABS: 4.1 10*3/uL (ref 1.5–6.5)
PLATELETS: 323 10*3/uL (ref 145–400)
RBC: 4.49 10*6/uL (ref 3.70–5.45)
RDW: 18.1 % — AB (ref 11.2–14.5)
WBC: 8.4 10*3/uL (ref 3.9–10.3)
lymph#: 3.2 10*3/uL (ref 0.9–3.3)

## 2014-06-08 MED ORDER — DARBEPOETIN ALFA 300 MCG/0.6ML IJ SOSY
300.0000 ug | PREFILLED_SYRINGE | Freq: Once | INTRAMUSCULAR | Status: AC
  Administered 2014-06-08: 300 ug via SUBCUTANEOUS
  Filled 2014-06-08: qty 0.6

## 2014-06-08 NOTE — Patient Instructions (Signed)
Darbepoetin Alfa injection What is this medicine? DARBEPOETIN ALFA (dar be POE e tin AL fa) helps your body make more red blood cells. It is used to treat anemia caused by chronic kidney failure and chemotherapy. This medicine may be used for other purposes; ask your health care provider or pharmacist if you have questions. COMMON BRAND NAME(S): Aranesp What should I tell my health care provider before I take this medicine? They need to know if you have any of these conditions: -blood clotting disorders or history of blood clots -cancer patient not on chemotherapy -cystic fibrosis -heart disease, such as angina, heart failure, or a history of a heart attack -hemoglobin level of 12 g/dL or greater -high blood pressure -low levels of folate, iron, or vitamin B12 -seizures -an unusual or allergic reaction to darbepoetin, erythropoietin, albumin, hamster proteins, latex, other medicines, foods, dyes, or preservatives -pregnant or trying to get pregnant -breast-feeding How should I use this medicine? This medicine is for injection into a vein or under the skin. It is usually given by a health care professional in a hospital or clinic setting. If you get this medicine at home, you will be taught how to prepare and give this medicine. Do not shake the solution before you withdraw a dose. Use exactly as directed. Take your medicine at regular intervals. Do not take your medicine more often than directed. It is important that you put your used needles and syringes in a special sharps container. Do not put them in a trash can. If you do not have a sharps container, call your pharmacist or healthcare provider to get one. Talk to your pediatrician regarding the use of this medicine in children. While this medicine may be used in children as young as 1 year for selected conditions, precautions do apply. Overdosage: If you think you have taken too much of this medicine contact a poison control center or  emergency room at once. NOTE: This medicine is only for you. Do not share this medicine with others. What if I miss a dose? If you miss a dose, take it as soon as you can. If it is almost time for your next dose, take only that dose. Do not take double or extra doses. What may interact with this medicine? Do not take this medicine with any of the following medications: -epoetin alfa This list may not describe all possible interactions. Give your health care provider a list of all the medicines, herbs, non-prescription drugs, or dietary supplements you use. Also tell them if you smoke, drink alcohol, or use illegal drugs. Some items may interact with your medicine. What should I watch for while using this medicine? Visit your prescriber or health care professional for regular checks on your progress and for the needed blood tests and blood pressure measurements. It is especially important for the doctor to make sure your hemoglobin level is in the desired range, to limit the risk of potential side effects and to give you the best benefit. Keep all appointments for any recommended tests. Check your blood pressure as directed. Ask your doctor what your blood pressure should be and when you should contact him or her. As your body makes more red blood cells, you may need to take iron, folic acid, or vitamin B supplements. Ask your doctor or health care provider which products are right for you. If you have kidney disease continue dietary restrictions, even though this medication can make you feel better. Talk with your doctor or health   care professional about the foods you eat and the vitamins that you take. What side effects may I notice from receiving this medicine? Side effects that you should report to your doctor or health care professional as soon as possible: -allergic reactions like skin rash, itching or hives, swelling of the face, lips, or tongue -breathing problems -changes in vision -chest  pain -confusion, trouble speaking or understanding -feeling faint or lightheaded, falls -high blood pressure -muscle aches or pains -pain, swelling, warmth in the leg -rapid weight gain -severe headaches -sudden numbness or weakness of the face, arm or leg -trouble walking, dizziness, loss of balance or coordination -seizures (convulsions) -swelling of the ankles, feet, hands -unusually weak or tired Side effects that usually do not require medical attention (report to your doctor or health care professional if they continue or are bothersome): -diarrhea -fever, chills (flu-like symptoms) -headaches -nausea, vomiting -redness, stinging, or swelling at site where injected This list may not describe all possible side effects. Call your doctor for medical advice about side effects. You may report side effects to FDA at 1-800-FDA-1088. Where should I keep my medicine? Keep out of the reach of children. Store in a refrigerator between 2 and 8 degrees C (36 and 46 degrees F). Do not freeze. Do not shake. Throw away any unused portion if using a single-dose vial. Throw away any unused medicine after the expiration date. NOTE: This sheet is a summary. It may not cover all possible information. If you have questions about this medicine, talk to your doctor, pharmacist, or health care provider.  2015, [employer]. (2008-05-10 10:23:57)  

## 2014-06-09 DIAGNOSIS — M25611 Stiffness of right shoulder, not elsewhere classified: Secondary | ICD-10-CM | POA: Diagnosis not present

## 2014-06-09 DIAGNOSIS — M6281 Muscle weakness (generalized): Secondary | ICD-10-CM | POA: Diagnosis not present

## 2014-06-09 DIAGNOSIS — M75121 Complete rotator cuff tear or rupture of right shoulder, not specified as traumatic: Secondary | ICD-10-CM | POA: Diagnosis not present

## 2014-06-09 DIAGNOSIS — M25511 Pain in right shoulder: Secondary | ICD-10-CM | POA: Diagnosis not present

## 2014-06-14 DIAGNOSIS — M25611 Stiffness of right shoulder, not elsewhere classified: Secondary | ICD-10-CM | POA: Diagnosis not present

## 2014-06-14 DIAGNOSIS — M25511 Pain in right shoulder: Secondary | ICD-10-CM | POA: Diagnosis not present

## 2014-06-14 DIAGNOSIS — M75121 Complete rotator cuff tear or rupture of right shoulder, not specified as traumatic: Secondary | ICD-10-CM | POA: Diagnosis not present

## 2014-06-14 DIAGNOSIS — M6281 Muscle weakness (generalized): Secondary | ICD-10-CM | POA: Diagnosis not present

## 2014-06-16 DIAGNOSIS — M25511 Pain in right shoulder: Secondary | ICD-10-CM | POA: Diagnosis not present

## 2014-06-16 DIAGNOSIS — M25611 Stiffness of right shoulder, not elsewhere classified: Secondary | ICD-10-CM | POA: Diagnosis not present

## 2014-06-16 DIAGNOSIS — M75121 Complete rotator cuff tear or rupture of right shoulder, not specified as traumatic: Secondary | ICD-10-CM | POA: Diagnosis not present

## 2014-06-16 DIAGNOSIS — M6281 Muscle weakness (generalized): Secondary | ICD-10-CM | POA: Diagnosis not present

## 2014-06-24 DIAGNOSIS — M75121 Complete rotator cuff tear or rupture of right shoulder, not specified as traumatic: Secondary | ICD-10-CM | POA: Diagnosis not present

## 2014-06-24 DIAGNOSIS — M6281 Muscle weakness (generalized): Secondary | ICD-10-CM | POA: Diagnosis not present

## 2014-06-24 DIAGNOSIS — M25611 Stiffness of right shoulder, not elsewhere classified: Secondary | ICD-10-CM | POA: Diagnosis not present

## 2014-06-24 DIAGNOSIS — M25511 Pain in right shoulder: Secondary | ICD-10-CM | POA: Diagnosis not present

## 2014-06-28 DIAGNOSIS — M25511 Pain in right shoulder: Secondary | ICD-10-CM | POA: Diagnosis not present

## 2014-06-28 DIAGNOSIS — M75121 Complete rotator cuff tear or rupture of right shoulder, not specified as traumatic: Secondary | ICD-10-CM | POA: Diagnosis not present

## 2014-06-28 DIAGNOSIS — M25611 Stiffness of right shoulder, not elsewhere classified: Secondary | ICD-10-CM | POA: Diagnosis not present

## 2014-06-28 DIAGNOSIS — M6281 Muscle weakness (generalized): Secondary | ICD-10-CM | POA: Diagnosis not present

## 2014-06-29 ENCOUNTER — Encounter: Payer: Self-pay | Admitting: Physician Assistant

## 2014-06-29 ENCOUNTER — Ambulatory Visit (HOSPITAL_BASED_OUTPATIENT_CLINIC_OR_DEPARTMENT_OTHER): Payer: Medicare Other | Admitting: Physician Assistant

## 2014-06-29 ENCOUNTER — Ambulatory Visit (HOSPITAL_BASED_OUTPATIENT_CLINIC_OR_DEPARTMENT_OTHER): Payer: Medicare Other

## 2014-06-29 ENCOUNTER — Other Ambulatory Visit (HOSPITAL_BASED_OUTPATIENT_CLINIC_OR_DEPARTMENT_OTHER): Payer: Medicare Other

## 2014-06-29 ENCOUNTER — Telehealth: Payer: Self-pay | Admitting: Oncology

## 2014-06-29 VITALS — BP 137/62 | HR 74 | Temp 97.7°F | Resp 18 | Ht 63.0 in | Wt 289.5 lb

## 2014-06-29 DIAGNOSIS — D569 Thalassemia, unspecified: Secondary | ICD-10-CM

## 2014-06-29 DIAGNOSIS — M069 Rheumatoid arthritis, unspecified: Secondary | ICD-10-CM

## 2014-06-29 DIAGNOSIS — N289 Disorder of kidney and ureter, unspecified: Secondary | ICD-10-CM

## 2014-06-29 DIAGNOSIS — D509 Iron deficiency anemia, unspecified: Secondary | ICD-10-CM

## 2014-06-29 DIAGNOSIS — D561 Beta thalassemia: Secondary | ICD-10-CM

## 2014-06-29 DIAGNOSIS — D508 Other iron deficiency anemias: Secondary | ICD-10-CM

## 2014-06-29 DIAGNOSIS — D638 Anemia in other chronic diseases classified elsewhere: Secondary | ICD-10-CM | POA: Diagnosis not present

## 2014-06-29 DIAGNOSIS — D568 Other thalassemias: Secondary | ICD-10-CM

## 2014-06-29 LAB — CBC WITH DIFFERENTIAL/PLATELET
BASO%: 0.3 % (ref 0.0–2.0)
BASOS ABS: 0 10*3/uL (ref 0.0–0.1)
EOS%: 1.2 % (ref 0.0–7.0)
Eosinophils Absolute: 0.1 10*3/uL (ref 0.0–0.5)
HCT: 34.5 % — ABNORMAL LOW (ref 34.8–46.6)
HEMOGLOBIN: 10.6 g/dL — AB (ref 11.6–15.9)
LYMPH%: 49 % (ref 14.0–49.7)
MCH: 21.4 pg — ABNORMAL LOW (ref 25.1–34.0)
MCHC: 30.7 g/dL — ABNORMAL LOW (ref 31.5–36.0)
MCV: 69.7 fL — AB (ref 79.5–101.0)
MONO#: 0.8 10*3/uL (ref 0.1–0.9)
MONO%: 11.4 % (ref 0.0–14.0)
NEUT#: 2.8 10*3/uL (ref 1.5–6.5)
NEUT%: 38.1 % — ABNORMAL LOW (ref 38.4–76.8)
Platelets: 270 10*3/uL (ref 145–400)
RBC: 4.95 10*6/uL (ref 3.70–5.45)
RDW: 17.2 % — ABNORMAL HIGH (ref 11.2–14.5)
WBC: 7.2 10*3/uL (ref 3.9–10.3)
lymph#: 3.5 10*3/uL — ABNORMAL HIGH (ref 0.9–3.3)
nRBC: 0 % (ref 0–0)

## 2014-06-29 MED ORDER — DARBEPOETIN ALFA 300 MCG/0.6ML IJ SOSY
300.0000 ug | PREFILLED_SYRINGE | Freq: Once | INTRAMUSCULAR | Status: AC
  Administered 2014-06-29: 300 ug via SUBCUTANEOUS
  Filled 2014-06-29: qty 0.6

## 2014-06-29 NOTE — Telephone Encounter (Signed)
lvm for pt regarding to March appt....mailed pt appt sched/avs and letter °

## 2014-06-29 NOTE — Progress Notes (Signed)
Hematology and Oncology Follow Up Visit  Jessica Howard [id number] [birth year] 53 y.o. 06/29/2014 4:36 PM  CC: Merrilee Seashore, M.D.  [name], M.D.  Principle Diagnosis: This is a 54 year old female with microcytic anemia due to beta thalassemia.  She has element of iron deficiency due to heavy menstrual bleeding. She also has element of anemia of chronic disease with history of rheumatoid arthritis.  Interim History:  Jessica Howard presents today for a follow-up visit. Since her last visit, she had right rotator cuffs surgery. She's currently doing physical therapy twice a week. She is tolerating the Aranesp injections without difficulty. She reports that her energy level has improved.  She does have diffuse pain all over, again related to her rheumatoid arthritis.  She is still continuing to have heavy menstrual cycles lasting five days with clots at times.   She did not report headaches or blurry vision or syncope. She did not report any chest pain or palpitation. Does not report any frequency urgency or hesitancy. She does not report any hematuria hematochezia or melena. Remainder of her review of systems unremarkable.   Medications: I have reviewed the patient's current medications.  Current Outpatient Prescriptions  Medication Sig Dispense Refill  . abatacept (ORENCIA) 250 MG injection Inject 1,000 mg into the vein every 28 (twenty-eight) days. Monthly Infusion    . amitriptyline (ELAVIL) 25 MG tablet Take 25 mg by mouth at bedtime.     . BD INSULIN SYRINGE ULTRAFINE 31G X 15/64" 1 ML MISC     . chlorthalidone (HYGROTON) 25 MG tablet Take 25 mg by mouth daily.    . CRESTOR 20 MG tablet 20 mg daily.     Marland Kitchen diltiazem (CARDIZEM CD) 240 MG 24 hr capsule Take 240 mg by mouth daily.     Marland Kitchen docusate sodium 100 MG CAPS 1 tab 2 times a day while on narcotics.  STOOL SOFTENER 60 capsule 0  . fluticasone (FLONASE) 50 MCG/ACT nasal spray Place 1 spray into both nostrils daily as needed  for allergies or rhinitis.    . furosemide (LASIX) 20 MG tablet Take 0.5 tablets (10 mg total) by mouth daily. 15 tablet 0  . gabapentin (NEURONTIN) 300 MG capsule Take 600 mg by mouth 3 (three) times daily.     Marland Kitchen HYDROcodone-acetaminophen (NORCO) 10-325 MG per tablet Take 1 tablet by mouth every 4 (four) hours as needed for moderate pain. 60 tablet 0  . insulin lispro (HUMALOG) 100 UNIT/ML injection Inject 40 Units into the skin 3 (three) times daily before meals. Per sliding scale    . insulin NPH (HUMULIN N,NOVOLIN N) 100 UNIT/ML injection Inject 75 Units into the skin 3 (three) times daily with meals. 75 units with breakfast; lunch 75 units; 75 units at dinner    . irbesartan (AVAPRO) 300 MG tablet Take 300 mg by mouth daily.     . Nebivolol HCl (BYSTOLIC) 20 MG TABS Take 20 mg by mouth daily.    [name] VERIO test strip   5  . pantoprazole (PROTONIX) 40 MG tablet Take 40 mg by mouth daily.    . polyethylene glycol (MIRALAX / GLYCOLAX) packet Take 17 g by mouth 2 (two) times daily. 60 each 0  . predniSONE (DELTASONE) 5 MG tablet Take 5 mg by mouth daily.    Marland Kitchen tiZANidine (ZANAFLEX) 4 MG tablet Take 4 mg by mouth 2 (two) times daily as needed (migraines). Limit 2 days per week    . topiramate (TOPAMAX) 100 MG  tablet Take 100 mg by mouth daily.     . VOLTAREN 1 % GEL Apply 1 application topically 3 (three) times daily.     Marland Kitchen BYSTOLIC 10 MG tablet   8  . ondansetron (ZOFRAN-ODT) 4 MG disintegrating tablet   0   No current facility-administered medications for this visit.    Allergies:  Allergies  Allergen Reactions  . Bactrim [Sulfamethoxazole-Trimethoprim] Hives  . Cefuroxime Axetil Itching  . Cephalosporins Itching  . Lisinopril Cough  . Penicillins Hives  . Sulfa Antibiotics Hives  . Iohexol Itching and Rash     Code: RASH, Desc: HAD ITCHING AND A RASH ABOUT ONE HOUR AFTER RETURNING HOME FROM THE CT, Onset Date: [id number]     Past Medical History, Surgical history, Social  history, and Family History were reviewed and updated.   Blood pressure 137/62, pulse 74, temperature 97.7 F (36.5 C), temperature source Oral, resp. rate 18, height 5\' 3"  (1.6 m), weight 289 lb 8 oz (131.316 kg), last menstrual period 12/27/2013, SpO2 100 %. ECOG: 1 General appearance: alert is not in any distress. Head: Normocephalic, without obvious abnormality Neck: no adenopathy Lymph nodes: Cervical, supraclavicular, and axillary nodes normal. Heart:regular rate and rhythm, S1, S2 normal, no murmur, click, rub or gallop Lung:chest clear, no wheezing, rales, normal symmetric air entry Abdomin: soft, non-tender, without masses or organomegaly EXT:no erythema, induration, or nodules   CBC    Component Value Date/Time   WBC 7.2 06/29/2014 0846   WBC 10.3 05/21/2014 0329   RBC 4.95 06/29/2014 0846   RBC 4.10 05/21/2014 0329   HGB 10.6* 06/29/2014 0846   HGB 8.6* 05/21/2014 0329   HCT 34.5* 06/29/2014 0846   HCT 28.2* 05/21/2014 0329   PLT 270 06/29/2014 0846   PLT 278 05/21/2014 0329   MCV 69.7* 06/29/2014 0846   MCV 68.8* 05/21/2014 0329   MCH 21.4* 06/29/2014 0846   MCH 21.0* 05/21/2014 0329   MCHC 30.7* 06/29/2014 0846   MCHC 30.5 05/21/2014 0329   RDW 17.2* 06/29/2014 0846   RDW 17.5* 05/21/2014 0329   LYMPHSABS 3.5* 06/29/2014 0846   LYMPHSABS 2.5 08/26/2009 2310   MONOABS 0.8 06/29/2014 0846   MONOABS 0.7 08/26/2009 2310   EOSABS 0.1 06/29/2014 0846   EOSABS 0.1 08/26/2009 2310   BASOSABS 0.0 06/29/2014 0846   BASOSABS 0.0 08/26/2009 2310       Impression and Plan:  A 54 year old female with the following issues: 1. Microcytic anemia related to her beta thalassemia as well as iron deficiency. She appears to have an element of anemia of chronic disease as well. She is status post IV iron in September 2015 with her hemoglobin around 8.8 and not fully responsive. Her iron stores are fully replete at this time. She has been on Aranesp 300 g every 3 weeks  since November 2015 with gradual improvement in her hemoglobin. Her hemoglobin today is 10.6 g/dL. She will proceed with Aranesp as scheduled today She will continue with Aranesp injections as currently scheduled every 3 weeks.  2. Followup will be in 2 months to assess response to Aranesp.Wynetta Emery, Yu Cragun E, PA-C  1/20/20164:36 PM

## 2014-06-29 NOTE — Patient Instructions (Signed)
Continue labs and Aranesp injections as scheduled Follow-up in 2 months for another symptom management visit

## 2014-07-04 DIAGNOSIS — H179 Unspecified corneal scar and opacity: Secondary | ICD-10-CM | POA: Diagnosis not present

## 2014-07-04 DIAGNOSIS — H1820 Unspecified corneal edema: Secondary | ICD-10-CM | POA: Diagnosis not present

## 2014-07-04 DIAGNOSIS — H18832 Recurrent erosion of cornea, left eye: Secondary | ICD-10-CM | POA: Diagnosis not present

## 2014-07-04 DIAGNOSIS — H16142 Punctate keratitis, left eye: Secondary | ICD-10-CM | POA: Diagnosis not present

## 2014-07-05 DIAGNOSIS — M6281 Muscle weakness (generalized): Secondary | ICD-10-CM | POA: Diagnosis not present

## 2014-07-05 DIAGNOSIS — M25511 Pain in right shoulder: Secondary | ICD-10-CM | POA: Diagnosis not present

## 2014-07-05 DIAGNOSIS — H18832 Recurrent erosion of cornea, left eye: Secondary | ICD-10-CM | POA: Diagnosis not present

## 2014-07-05 DIAGNOSIS — M75121 Complete rotator cuff tear or rupture of right shoulder, not specified as traumatic: Secondary | ICD-10-CM | POA: Diagnosis not present

## 2014-07-05 DIAGNOSIS — M25611 Stiffness of right shoulder, not elsewhere classified: Secondary | ICD-10-CM | POA: Diagnosis not present

## 2014-07-07 DIAGNOSIS — H18832 Recurrent erosion of cornea, left eye: Secondary | ICD-10-CM | POA: Diagnosis not present

## 2014-07-08 DIAGNOSIS — M25511 Pain in right shoulder: Secondary | ICD-10-CM | POA: Diagnosis not present

## 2014-07-08 DIAGNOSIS — M25611 Stiffness of right shoulder, not elsewhere classified: Secondary | ICD-10-CM | POA: Diagnosis not present

## 2014-07-08 DIAGNOSIS — M75121 Complete rotator cuff tear or rupture of right shoulder, not specified as traumatic: Secondary | ICD-10-CM | POA: Diagnosis not present

## 2014-07-08 DIAGNOSIS — M6281 Muscle weakness (generalized): Secondary | ICD-10-CM | POA: Diagnosis not present

## 2014-07-11 DIAGNOSIS — H16142 Punctate keratitis, left eye: Secondary | ICD-10-CM | POA: Diagnosis not present

## 2014-07-11 DIAGNOSIS — H18832 Recurrent erosion of cornea, left eye: Secondary | ICD-10-CM | POA: Diagnosis not present

## 2014-07-14 DIAGNOSIS — M75121 Complete rotator cuff tear or rupture of right shoulder, not specified as traumatic: Secondary | ICD-10-CM | POA: Diagnosis not present

## 2014-07-14 DIAGNOSIS — H18832 Recurrent erosion of cornea, left eye: Secondary | ICD-10-CM | POA: Diagnosis not present

## 2014-07-14 DIAGNOSIS — M25511 Pain in right shoulder: Secondary | ICD-10-CM | POA: Diagnosis not present

## 2014-07-14 DIAGNOSIS — M6281 Muscle weakness (generalized): Secondary | ICD-10-CM | POA: Diagnosis not present

## 2014-07-14 DIAGNOSIS — M25611 Stiffness of right shoulder, not elsewhere classified: Secondary | ICD-10-CM | POA: Diagnosis not present

## 2014-07-18 DIAGNOSIS — M6281 Muscle weakness (generalized): Secondary | ICD-10-CM | POA: Diagnosis not present

## 2014-07-18 DIAGNOSIS — M75121 Complete rotator cuff tear or rupture of right shoulder, not specified as traumatic: Secondary | ICD-10-CM | POA: Diagnosis not present

## 2014-07-18 DIAGNOSIS — M25511 Pain in right shoulder: Secondary | ICD-10-CM | POA: Diagnosis not present

## 2014-07-18 DIAGNOSIS — M25611 Stiffness of right shoulder, not elsewhere classified: Secondary | ICD-10-CM | POA: Diagnosis not present

## 2014-07-21 DIAGNOSIS — M6281 Muscle weakness (generalized): Secondary | ICD-10-CM | POA: Diagnosis not present

## 2014-07-21 DIAGNOSIS — M25511 Pain in right shoulder: Secondary | ICD-10-CM | POA: Diagnosis not present

## 2014-07-21 DIAGNOSIS — M25611 Stiffness of right shoulder, not elsewhere classified: Secondary | ICD-10-CM | POA: Diagnosis not present

## 2014-07-21 DIAGNOSIS — M75121 Complete rotator cuff tear or rupture of right shoulder, not specified as traumatic: Secondary | ICD-10-CM | POA: Diagnosis not present

## 2014-07-26 DIAGNOSIS — M6281 Muscle weakness (generalized): Secondary | ICD-10-CM | POA: Diagnosis not present

## 2014-07-26 DIAGNOSIS — M25611 Stiffness of right shoulder, not elsewhere classified: Secondary | ICD-10-CM | POA: Diagnosis not present

## 2014-07-26 DIAGNOSIS — M75121 Complete rotator cuff tear or rupture of right shoulder, not specified as traumatic: Secondary | ICD-10-CM | POA: Diagnosis not present

## 2014-07-26 DIAGNOSIS — M25511 Pain in right shoulder: Secondary | ICD-10-CM | POA: Diagnosis not present

## 2014-07-27 ENCOUNTER — Ambulatory Visit (INDEPENDENT_AMBULATORY_CARE_PROVIDER_SITE_OTHER): Payer: Medicare Other | Admitting: Podiatry

## 2014-07-27 ENCOUNTER — Encounter: Payer: Self-pay | Admitting: Podiatry

## 2014-07-27 ENCOUNTER — Ambulatory Visit (INDEPENDENT_AMBULATORY_CARE_PROVIDER_SITE_OTHER): Payer: Medicare Other

## 2014-07-27 VITALS — BP 189/87 | HR 84 | Resp 17 | Ht 63.0 in | Wt 280.0 lb

## 2014-07-27 DIAGNOSIS — M6281 Muscle weakness (generalized): Secondary | ICD-10-CM | POA: Diagnosis not present

## 2014-07-27 DIAGNOSIS — M25511 Pain in right shoulder: Secondary | ICD-10-CM | POA: Diagnosis not present

## 2014-07-27 DIAGNOSIS — S92911A Unspecified fracture of right toe(s), initial encounter for closed fracture: Secondary | ICD-10-CM | POA: Diagnosis not present

## 2014-07-27 DIAGNOSIS — G629 Polyneuropathy, unspecified: Secondary | ICD-10-CM

## 2014-07-27 DIAGNOSIS — M75121 Complete rotator cuff tear or rupture of right shoulder, not specified as traumatic: Secondary | ICD-10-CM | POA: Diagnosis not present

## 2014-07-27 DIAGNOSIS — M79674 Pain in right toe(s): Secondary | ICD-10-CM

## 2014-07-27 DIAGNOSIS — M25611 Stiffness of right shoulder, not elsewhere classified: Secondary | ICD-10-CM | POA: Diagnosis not present

## 2014-07-27 NOTE — Progress Notes (Signed)
   Subjective:    Patient ID: Jessica Howard, female    DOB: Nov 18, 1960, 54 y.o.   MRN: [id number]  HPI Comments: Pt states she broke her right 1st toe in October 2015, and continues to have pain, swelling and a discoloration.     Review of Systems  Constitutional: Positive for unexpected weight change.  All other systems reviewed and are negative.      Objective:   Physical Exam        Assessment & Plan:

## 2014-07-27 NOTE — Progress Notes (Signed)
Subjective:     Patient ID: Jessica Howard, female   DOB: November 25, 1960, 54 y.o.   MRN: [id number]  HPI patient presents on referral with a broken right big toe that has been treated with surgical shoe and is hurting less but is discolored and swollen and she is a diabetic and was concerned   Review of Systems  All other systems reviewed and are negative.      Objective:   Physical Exam  Cardiovascular: Intact distal pulses.   Musculoskeletal: Normal range of motion.  Neurological: She is alert.  Skin: Skin is warm and dry.  Nursing note and vitals reviewed.  patient is obese female with neurovascular status that is diminished but intact and found to have edema in the ankle region bilateral. Her right hallux is swollen at the interphalangeal joint with some discoloration on the lateral side and it is moderately painful when pressed. Patient is found to have no other significant pathology and no drainage noted     Assessment:     Fracture right hallux comminuted in nature    Plan:     H&P and x-rays reviewed. After this many months and the fact there is no drainage or indication of an open fracture it would be best to let it completely healed explaining it may not heal completely and may ultimately require the removal of fragments. Patient will be seen back if symptoms persist in the next 3-4 months and she will wear her surgical shoe as much as possible

## 2014-08-02 DIAGNOSIS — M25611 Stiffness of right shoulder, not elsewhere classified: Secondary | ICD-10-CM | POA: Diagnosis not present

## 2014-08-02 DIAGNOSIS — M75121 Complete rotator cuff tear or rupture of right shoulder, not specified as traumatic: Secondary | ICD-10-CM | POA: Diagnosis not present

## 2014-08-02 DIAGNOSIS — M6281 Muscle weakness (generalized): Secondary | ICD-10-CM | POA: Diagnosis not present

## 2014-08-02 DIAGNOSIS — M25511 Pain in right shoulder: Secondary | ICD-10-CM | POA: Diagnosis not present

## 2014-08-04 DIAGNOSIS — M6281 Muscle weakness (generalized): Secondary | ICD-10-CM | POA: Diagnosis not present

## 2014-08-04 DIAGNOSIS — M75121 Complete rotator cuff tear or rupture of right shoulder, not specified as traumatic: Secondary | ICD-10-CM | POA: Diagnosis not present

## 2014-08-04 DIAGNOSIS — M25511 Pain in right shoulder: Secondary | ICD-10-CM | POA: Diagnosis not present

## 2014-08-04 DIAGNOSIS — M25611 Stiffness of right shoulder, not elsewhere classified: Secondary | ICD-10-CM | POA: Diagnosis not present

## 2014-08-05 ENCOUNTER — Encounter (HOSPITAL_COMMUNITY)
Admission: RE | Admit: 2014-08-05 | Discharge: 2014-08-05 | Disposition: A | Discharge status: Home / Self Care | Payer: Medicare Other | Source: Ambulatory Visit | Attending: Internal Medicine | Admitting: Internal Medicine

## 2014-08-05 DIAGNOSIS — M0589 Other rheumatoid arthritis with rheumatoid factor of multiple sites: Secondary | ICD-10-CM | POA: Diagnosis not present

## 2014-08-05 MED ORDER — SODIUM CHLORIDE 0.9 % IV SOLN
INTRAVENOUS | Status: DC
  Administered 2014-08-05: 09:00:00 via INTRAVENOUS

## 2014-08-05 MED ORDER — SODIUM CHLORIDE 0.9 % IV SOLN
1000.0000 mg | INTRAVENOUS | Status: DC
  Administered 2014-08-05: 1000 mg via INTRAVENOUS
  Filled 2014-08-05: qty 40

## 2014-08-08 DIAGNOSIS — H18411 Arcus senilis, right eye: Secondary | ICD-10-CM | POA: Diagnosis not present

## 2014-08-08 DIAGNOSIS — H169 Unspecified keratitis: Secondary | ICD-10-CM | POA: Diagnosis not present

## 2014-08-08 DIAGNOSIS — H01009 Unspecified blepharitis unspecified eye, unspecified eyelid: Secondary | ICD-10-CM | POA: Diagnosis not present

## 2014-08-08 DIAGNOSIS — H18892 Other specified disorders of cornea, left eye: Secondary | ICD-10-CM | POA: Diagnosis not present

## 2014-08-09 DIAGNOSIS — M75121 Complete rotator cuff tear or rupture of right shoulder, not specified as traumatic: Secondary | ICD-10-CM | POA: Diagnosis not present

## 2014-08-09 DIAGNOSIS — M6281 Muscle weakness (generalized): Secondary | ICD-10-CM | POA: Diagnosis not present

## 2014-08-09 DIAGNOSIS — M25511 Pain in right shoulder: Secondary | ICD-10-CM | POA: Diagnosis not present

## 2014-08-09 DIAGNOSIS — M25611 Stiffness of right shoulder, not elsewhere classified: Secondary | ICD-10-CM | POA: Diagnosis not present

## 2014-08-11 DIAGNOSIS — M6281 Muscle weakness (generalized): Secondary | ICD-10-CM | POA: Diagnosis not present

## 2014-08-11 DIAGNOSIS — M25511 Pain in right shoulder: Secondary | ICD-10-CM | POA: Diagnosis not present

## 2014-08-11 DIAGNOSIS — M75121 Complete rotator cuff tear or rupture of right shoulder, not specified as traumatic: Secondary | ICD-10-CM | POA: Diagnosis not present

## 2014-08-11 DIAGNOSIS — M25611 Stiffness of right shoulder, not elsewhere classified: Secondary | ICD-10-CM | POA: Diagnosis not present

## 2014-08-16 DIAGNOSIS — M25511 Pain in right shoulder: Secondary | ICD-10-CM | POA: Diagnosis not present

## 2014-08-16 DIAGNOSIS — M25611 Stiffness of right shoulder, not elsewhere classified: Secondary | ICD-10-CM | POA: Diagnosis not present

## 2014-08-16 DIAGNOSIS — M6281 Muscle weakness (generalized): Secondary | ICD-10-CM | POA: Diagnosis not present

## 2014-08-16 DIAGNOSIS — M75121 Complete rotator cuff tear or rupture of right shoulder, not specified as traumatic: Secondary | ICD-10-CM | POA: Diagnosis not present

## 2014-08-18 DIAGNOSIS — M25611 Stiffness of right shoulder, not elsewhere classified: Secondary | ICD-10-CM | POA: Diagnosis not present

## 2014-08-18 DIAGNOSIS — M6281 Muscle weakness (generalized): Secondary | ICD-10-CM | POA: Diagnosis not present

## 2014-08-18 DIAGNOSIS — M25511 Pain in right shoulder: Secondary | ICD-10-CM | POA: Diagnosis not present

## 2014-08-18 DIAGNOSIS — M75121 Complete rotator cuff tear or rupture of right shoulder, not specified as traumatic: Secondary | ICD-10-CM | POA: Diagnosis not present

## 2014-08-23 DIAGNOSIS — M25611 Stiffness of right shoulder, not elsewhere classified: Secondary | ICD-10-CM | POA: Diagnosis not present

## 2014-08-23 DIAGNOSIS — M25511 Pain in right shoulder: Secondary | ICD-10-CM | POA: Diagnosis not present

## 2014-08-23 DIAGNOSIS — M6281 Muscle weakness (generalized): Secondary | ICD-10-CM | POA: Diagnosis not present

## 2014-08-23 DIAGNOSIS — M75121 Complete rotator cuff tear or rupture of right shoulder, not specified as traumatic: Secondary | ICD-10-CM | POA: Diagnosis not present

## 2014-08-24 DIAGNOSIS — H169 Unspecified keratitis: Secondary | ICD-10-CM | POA: Diagnosis not present

## 2014-08-24 DIAGNOSIS — H01009 Unspecified blepharitis unspecified eye, unspecified eyelid: Secondary | ICD-10-CM | POA: Diagnosis not present

## 2014-08-24 DIAGNOSIS — H18892 Other specified disorders of cornea, left eye: Secondary | ICD-10-CM | POA: Diagnosis not present

## 2014-08-29 DIAGNOSIS — I10 Essential (primary) hypertension: Secondary | ICD-10-CM | POA: Diagnosis not present

## 2014-08-29 DIAGNOSIS — G609 Hereditary and idiopathic neuropathy, unspecified: Secondary | ICD-10-CM | POA: Diagnosis not present

## 2014-08-29 DIAGNOSIS — M75121 Complete rotator cuff tear or rupture of right shoulder, not specified as traumatic: Secondary | ICD-10-CM | POA: Diagnosis not present

## 2014-08-29 DIAGNOSIS — M6281 Muscle weakness (generalized): Secondary | ICD-10-CM | POA: Diagnosis not present

## 2014-08-29 DIAGNOSIS — E109 Type 1 diabetes mellitus without complications: Secondary | ICD-10-CM | POA: Diagnosis not present

## 2014-08-29 DIAGNOSIS — M25511 Pain in right shoulder: Secondary | ICD-10-CM | POA: Diagnosis not present

## 2014-08-29 DIAGNOSIS — E780 Pure hypercholesterolemia: Secondary | ICD-10-CM | POA: Diagnosis not present

## 2014-08-29 DIAGNOSIS — M25611 Stiffness of right shoulder, not elsewhere classified: Secondary | ICD-10-CM | POA: Diagnosis not present

## 2014-08-30 DIAGNOSIS — M25511 Pain in right shoulder: Secondary | ICD-10-CM | POA: Diagnosis not present

## 2014-08-30 DIAGNOSIS — M25611 Stiffness of right shoulder, not elsewhere classified: Secondary | ICD-10-CM | POA: Diagnosis not present

## 2014-08-31 ENCOUNTER — Ambulatory Visit (HOSPITAL_BASED_OUTPATIENT_CLINIC_OR_DEPARTMENT_OTHER): Payer: Medicare Other | Admitting: Oncology

## 2014-08-31 ENCOUNTER — Other Ambulatory Visit (HOSPITAL_BASED_OUTPATIENT_CLINIC_OR_DEPARTMENT_OTHER): Payer: Medicare Other

## 2014-08-31 ENCOUNTER — Ambulatory Visit (HOSPITAL_BASED_OUTPATIENT_CLINIC_OR_DEPARTMENT_OTHER): Payer: Medicare Other

## 2014-08-31 ENCOUNTER — Telehealth: Payer: Self-pay | Admitting: Oncology

## 2014-08-31 VITALS — BP 135/68 | HR 78 | Temp 98.0°F | Resp 19 | Ht 63.0 in | Wt 299.4 lb

## 2014-08-31 DIAGNOSIS — D509 Iron deficiency anemia, unspecified: Secondary | ICD-10-CM

## 2014-08-31 DIAGNOSIS — D569 Thalassemia, unspecified: Secondary | ICD-10-CM

## 2014-08-31 DIAGNOSIS — D638 Anemia in other chronic diseases classified elsewhere: Secondary | ICD-10-CM

## 2014-08-31 DIAGNOSIS — M069 Rheumatoid arthritis, unspecified: Secondary | ICD-10-CM

## 2014-08-31 DIAGNOSIS — N289 Disorder of kidney and ureter, unspecified: Secondary | ICD-10-CM

## 2014-08-31 LAB — CBC WITH DIFFERENTIAL/PLATELET
BASO%: 0.4 % (ref 0.0–2.0)
Basophils Absolute: 0 10*3/uL (ref 0.0–0.1)
EOS ABS: 0.1 10*3/uL (ref 0.0–0.5)
EOS%: 0.6 % (ref 0.0–7.0)
HCT: 31.4 % — ABNORMAL LOW (ref 34.8–46.6)
HGB: 9.7 g/dL — ABNORMAL LOW (ref 11.6–15.9)
LYMPH%: 36.2 % (ref 14.0–49.7)
MCH: 20.8 pg — ABNORMAL LOW (ref 25.1–34.0)
MCHC: 30.9 g/dL — ABNORMAL LOW (ref 31.5–36.0)
MCV: 67.2 fL — ABNORMAL LOW (ref 79.5–101.0)
MONO#: 1 10*3/uL — ABNORMAL HIGH (ref 0.1–0.9)
MONO%: 9.7 % (ref 0.0–14.0)
NEUT%: 53.1 % (ref 38.4–76.8)
NEUTROS ABS: 5.2 10*3/uL (ref 1.5–6.5)
Platelets: 266 10*3/uL (ref 145–400)
RBC: 4.67 10*6/uL (ref 3.70–5.45)
RDW: 17.3 % — AB (ref 11.2–14.5)
WBC: 9.9 10*3/uL (ref 3.9–10.3)
lymph#: 3.6 10*3/uL — ABNORMAL HIGH (ref 0.9–3.3)
nRBC: 0 % (ref 0–0)

## 2014-08-31 MED ORDER — DARBEPOETIN ALFA 300 MCG/0.6ML IJ SOSY
300.0000 ug | PREFILLED_SYRINGE | Freq: Once | INTRAMUSCULAR | Status: AC
  Administered 2014-08-31: 300 ug via SUBCUTANEOUS
  Filled 2014-08-31: qty 0.6

## 2014-08-31 NOTE — Progress Notes (Signed)
Hematology and Oncology Follow Up Visit  Jessica Howard [id number] 1961-02-03 53 y.o. 08/31/2014 10:27 AM  CC: Jessica Howard, M.D.  [name], M.D.  Principle Diagnosis: This is a 54 year old female with microcytic anemia due to beta thalassemia.  She has element of iron deficiency due to heavy menstrual bleeding. She also has element of anemia of chronic disease with history of rheumatoid arthritis.  Current therapy: Aranesp 300 g every 4 weeks to keep her hemoglobin close to 11.  Interim History:  Jessica Howard presents today for a follow-up visit. Since her last visit, she is tolerating the Aranesp injections without difficulty. She has reported no complications at this time. She reports that her energy level has improved.  She does have diffuse pain all over, again related to her rheumatoid arthritis.  She is still continuing to have heavy menstrual cycles lasting five days with clots at times.  She is receiving Orencia injections which have helped her rheumatoid arthritis.  She did not report headaches or blurry vision or syncope. She did not report any chest pain or palpitation. Does not report any frequency urgency or hesitancy. She does not report any hematuria hematochezia or melena. Remainder of her review of systems unremarkable.   Medications: I have reviewed the patient's current medications.  Current Outpatient Prescriptions  Medication Sig Dispense Refill  . abatacept (ORENCIA) 250 MG injection Inject 1,000 mg into the vein every 28 (twenty-eight) days. Monthly Infusion    . amitriptyline (ELAVIL) 25 MG tablet Take 25 mg by mouth at bedtime.     . BD INSULIN SYRINGE ULTRAFINE 31G X 15/64" 1 ML MISC     . BYSTOLIC 10 MG tablet   8  . chlorthalidone (HYGROTON) 25 MG tablet Take 25 mg by mouth daily.    . CRESTOR 20 MG tablet 20 mg daily.     Marland Kitchen diltiazem (CARDIZEM CD) 240 MG 24 hr capsule Take 240 mg by mouth daily.     Marland Kitchen docusate sodium 100 MG CAPS 1 tab 2 times  a day while on narcotics.  STOOL SOFTENER 60 capsule 0  . fluticasone (FLONASE) 50 MCG/ACT nasal spray Place 1 spray into both nostrils daily as needed for allergies or rhinitis.    . furosemide (LASIX) 20 MG tablet Take 0.5 tablets (10 mg total) by mouth daily. 15 tablet 0  . gabapentin (NEURONTIN) 300 MG capsule Take 600 mg by mouth 3 (three) times daily.     Marland Kitchen HYDROcodone-acetaminophen (NORCO) 10-325 MG per tablet Take 1 tablet by mouth every 4 (four) hours as needed for moderate pain. 60 tablet 0  . insulin lispro (HUMALOG) 100 UNIT/ML injection Inject 40 Units into the skin 3 (three) times daily before meals. Per sliding scale    . insulin NPH (HUMULIN N,NOVOLIN N) 100 UNIT/ML injection Inject 75 Units into the skin 3 (three) times daily with meals. 75 units with breakfast; lunch 75 units; 75 units at dinner    . irbesartan (AVAPRO) 300 MG tablet Take 300 mg by mouth daily.     . Nebivolol HCl (BYSTOLIC) 20 MG TABS Take 20 mg by mouth daily.    . ondansetron (ZOFRAN-ODT) 4 MG disintegrating tablet   0  . ONETOUCH VERIO test strip   5  . pantoprazole (PROTONIX) 40 MG tablet Take 40 mg by mouth daily.    . polyethylene glycol (MIRALAX / GLYCOLAX) packet Take 17 g by mouth 2 (two) times daily. 60 each 0  . predniSONE (DELTASONE) 5 MG  tablet Take 5 mg by mouth daily.    Marland Kitchen tiZANidine (ZANAFLEX) 4 MG tablet Take 4 mg by mouth 2 (two) times daily as needed (migraines). Limit 2 days per week    . topiramate (TOPAMAX) 100 MG tablet Take 100 mg by mouth daily.     . VOLTAREN 1 % GEL Apply 1 application topically 3 (three) times daily.      No current facility-administered medications for this visit.    Allergies:  Allergies  Allergen Reactions  . Bactrim [Sulfamethoxazole-Trimethoprim] Hives  . Cefuroxime Axetil Itching  . Cephalosporins Itching  . Lisinopril Cough  . Penicillins Hives  . Sulfa Antibiotics Hives  . Iohexol Itching and Rash     Code: RASH, Desc: HAD ITCHING AND A RASH ABOUT  ONE HOUR AFTER RETURNING HOME FROM THE CT, Onset Date: [id number]     Past Medical History, Surgical history, Social history, and Family History were reviewed and updated.   Blood pressure 135/68, pulse 78, temperature 98 F (36.7 C), temperature source Oral, resp. rate 19, height 5\' 3"  (1.6 m), weight 299 lb 6.4 oz (135.807 kg), last menstrual period 12/27/2013, SpO2 100 %. ECOG: 1 General appearance: alert is not in any distress. Head: Normocephalic, without obvious abnormality Neck: no adenopathy Lymph nodes: Cervical, supraclavicular, and axillary nodes normal. Heart:regular rate and rhythm, S1, S2 normal, no murmur, click, rub or gallop Lung:chest clear, no wheezing, rales, normal symmetric air entry Abdomin: soft, non-tender, without masses or organomegaly EXT:no erythema, induration, or nodules. Diffuse joint swelling noted in her wrists bilaterally.   CBC    Component Value Date/Time   WBC 9.9 08/31/2014 1003   WBC 10.3 05/21/2014 0329   RBC 4.67 08/31/2014 1003   RBC 4.10 05/21/2014 0329   HGB 9.7* 08/31/2014 1003   HGB 8.6* 05/21/2014 0329   HCT 31.4* 08/31/2014 1003   HCT 28.2* 05/21/2014 0329   PLT 266 08/31/2014 1003   PLT 278 05/21/2014 0329   MCV 67.2* 08/31/2014 1003   MCV 68.8* 05/21/2014 0329   MCH 20.8* 08/31/2014 1003   MCH 21.0* 05/21/2014 0329   MCHC 30.9* 08/31/2014 1003   MCHC 30.5 05/21/2014 0329   RDW 17.3* 08/31/2014 1003   RDW 17.5* 05/21/2014 0329   LYMPHSABS 3.6* 08/31/2014 1003   LYMPHSABS 2.5 08/26/2009 2310   MONOABS 1.0* 08/31/2014 1003   MONOABS 0.7 08/26/2009 2310   EOSABS 0.1 08/31/2014 1003   EOSABS 0.1 08/26/2009 2310   BASOSABS 0.0 08/31/2014 1003   BASOSABS 0.0 08/26/2009 2310       Impression and Plan:  A 54 year old female with the following issues: 1. Microcytic anemia related to her beta thalassemia as well as iron deficiency. She appears to have an element of anemia of chronic disease as well. She is status post IV  iron in September 2015. She is currently on Aranesp and have tolerated it well. Although she have not received any injections since January 2016. I plan on putting her on a monthly schedule to get her hemoglobin close to 11. 2. Followup will be in 6 months to assess response to Aranesp.  [facility], MD 3/23/201610:27 AM

## 2014-08-31 NOTE — Addendum Note (Signed)
Addended by: Randolm Idol on: 08/31/2014 12:26 PM   Modules accepted: Medications

## 2014-08-31 NOTE — Telephone Encounter (Signed)
gave and prnited appt sched and avs for pt for March thru Sept.

## 2014-09-01 DIAGNOSIS — M25511 Pain in right shoulder: Secondary | ICD-10-CM | POA: Diagnosis not present

## 2014-09-01 DIAGNOSIS — M6281 Muscle weakness (generalized): Secondary | ICD-10-CM | POA: Diagnosis not present

## 2014-09-01 DIAGNOSIS — M75121 Complete rotator cuff tear or rupture of right shoulder, not specified as traumatic: Secondary | ICD-10-CM | POA: Diagnosis not present

## 2014-09-01 DIAGNOSIS — M25611 Stiffness of right shoulder, not elsewhere classified: Secondary | ICD-10-CM | POA: Diagnosis not present

## 2014-09-06 ENCOUNTER — Encounter (HOSPITAL_COMMUNITY)
Admission: RE | Admit: 2014-09-06 | Discharge: 2014-09-06 | Disposition: A | Discharge status: Home / Self Care | Payer: Medicare Other | Source: Ambulatory Visit | Attending: Internal Medicine | Admitting: Internal Medicine

## 2014-09-06 DIAGNOSIS — M0589 Other rheumatoid arthritis with rheumatoid factor of multiple sites: Secondary | ICD-10-CM | POA: Insufficient documentation

## 2014-09-06 MED ORDER — SODIUM CHLORIDE 0.9 % IV SOLN
1000.0000 mg | INTRAVENOUS | Status: DC
  Administered 2014-09-06: 1000 mg via INTRAVENOUS
  Filled 2014-09-06: qty 40

## 2014-09-06 MED ORDER — SODIUM CHLORIDE 0.9 % IV SOLN
INTRAVENOUS | Status: DC
  Administered 2014-09-06: 250 mL via INTRAVENOUS

## 2014-09-09 DIAGNOSIS — H179 Unspecified corneal scar and opacity: Secondary | ICD-10-CM | POA: Diagnosis not present

## 2014-09-12 DIAGNOSIS — I10 Essential (primary) hypertension: Secondary | ICD-10-CM | POA: Diagnosis not present

## 2014-09-12 DIAGNOSIS — E780 Pure hypercholesterolemia: Secondary | ICD-10-CM | POA: Diagnosis not present

## 2014-09-12 DIAGNOSIS — E109 Type 1 diabetes mellitus without complications: Secondary | ICD-10-CM | POA: Diagnosis not present

## 2014-09-12 DIAGNOSIS — R809 Proteinuria, unspecified: Secondary | ICD-10-CM | POA: Diagnosis not present

## 2014-09-13 DIAGNOSIS — M25511 Pain in right shoulder: Secondary | ICD-10-CM | POA: Diagnosis not present

## 2014-09-13 DIAGNOSIS — M75121 Complete rotator cuff tear or rupture of right shoulder, not specified as traumatic: Secondary | ICD-10-CM | POA: Diagnosis not present

## 2014-09-13 DIAGNOSIS — M25611 Stiffness of right shoulder, not elsewhere classified: Secondary | ICD-10-CM | POA: Diagnosis not present

## 2014-09-13 DIAGNOSIS — M6281 Muscle weakness (generalized): Secondary | ICD-10-CM | POA: Diagnosis not present

## 2014-09-15 DIAGNOSIS — M25611 Stiffness of right shoulder, not elsewhere classified: Secondary | ICD-10-CM | POA: Diagnosis not present

## 2014-09-15 DIAGNOSIS — M25511 Pain in right shoulder: Secondary | ICD-10-CM | POA: Diagnosis not present

## 2014-09-15 DIAGNOSIS — M75121 Complete rotator cuff tear or rupture of right shoulder, not specified as traumatic: Secondary | ICD-10-CM | POA: Diagnosis not present

## 2014-09-15 DIAGNOSIS — M6281 Muscle weakness (generalized): Secondary | ICD-10-CM | POA: Diagnosis not present

## 2014-09-20 DIAGNOSIS — M6281 Muscle weakness (generalized): Secondary | ICD-10-CM | POA: Diagnosis not present

## 2014-09-20 DIAGNOSIS — M75121 Complete rotator cuff tear or rupture of right shoulder, not specified as traumatic: Secondary | ICD-10-CM | POA: Diagnosis not present

## 2014-09-20 DIAGNOSIS — M25511 Pain in right shoulder: Secondary | ICD-10-CM | POA: Diagnosis not present

## 2014-09-20 DIAGNOSIS — M25611 Stiffness of right shoulder, not elsewhere classified: Secondary | ICD-10-CM | POA: Diagnosis not present

## 2014-09-22 DIAGNOSIS — M6281 Muscle weakness (generalized): Secondary | ICD-10-CM | POA: Diagnosis not present

## 2014-09-22 DIAGNOSIS — M75121 Complete rotator cuff tear or rupture of right shoulder, not specified as traumatic: Secondary | ICD-10-CM | POA: Diagnosis not present

## 2014-09-22 DIAGNOSIS — M25611 Stiffness of right shoulder, not elsewhere classified: Secondary | ICD-10-CM | POA: Diagnosis not present

## 2014-09-22 DIAGNOSIS — M25511 Pain in right shoulder: Secondary | ICD-10-CM | POA: Diagnosis not present

## 2014-09-26 DIAGNOSIS — M25611 Stiffness of right shoulder, not elsewhere classified: Secondary | ICD-10-CM | POA: Diagnosis not present

## 2014-09-26 DIAGNOSIS — M6281 Muscle weakness (generalized): Secondary | ICD-10-CM | POA: Diagnosis not present

## 2014-09-26 DIAGNOSIS — M75121 Complete rotator cuff tear or rupture of right shoulder, not specified as traumatic: Secondary | ICD-10-CM | POA: Diagnosis not present

## 2014-09-26 DIAGNOSIS — M25511 Pain in right shoulder: Secondary | ICD-10-CM | POA: Diagnosis not present

## 2014-09-27 DIAGNOSIS — M25511 Pain in right shoulder: Secondary | ICD-10-CM | POA: Diagnosis not present

## 2014-09-27 DIAGNOSIS — M25611 Stiffness of right shoulder, not elsewhere classified: Secondary | ICD-10-CM | POA: Diagnosis not present

## 2014-09-27 DIAGNOSIS — M75121 Complete rotator cuff tear or rupture of right shoulder, not specified as traumatic: Secondary | ICD-10-CM | POA: Diagnosis not present

## 2014-09-27 DIAGNOSIS — M6281 Muscle weakness (generalized): Secondary | ICD-10-CM | POA: Diagnosis not present

## 2014-09-29 ENCOUNTER — Other Ambulatory Visit (HOSPITAL_BASED_OUTPATIENT_CLINIC_OR_DEPARTMENT_OTHER): Payer: Medicare Other

## 2014-09-29 ENCOUNTER — Ambulatory Visit (HOSPITAL_BASED_OUTPATIENT_CLINIC_OR_DEPARTMENT_OTHER): Payer: Medicare Other

## 2014-09-29 VITALS — BP 163/82 | HR 82 | Temp 98.5°F

## 2014-09-29 DIAGNOSIS — D569 Thalassemia, unspecified: Secondary | ICD-10-CM

## 2014-09-29 DIAGNOSIS — D638 Anemia in other chronic diseases classified elsewhere: Secondary | ICD-10-CM

## 2014-09-29 DIAGNOSIS — M069 Rheumatoid arthritis, unspecified: Secondary | ICD-10-CM | POA: Diagnosis not present

## 2014-09-29 DIAGNOSIS — D509 Iron deficiency anemia, unspecified: Secondary | ICD-10-CM

## 2014-09-29 DIAGNOSIS — N289 Disorder of kidney and ureter, unspecified: Secondary | ICD-10-CM

## 2014-09-29 LAB — CBC WITH DIFFERENTIAL/PLATELET
BASO%: 0.4 % (ref 0.0–2.0)
BASOS ABS: 0 10*3/uL (ref 0.0–0.1)
EOS ABS: 0.1 10*3/uL (ref 0.0–0.5)
EOS%: 0.9 % (ref 0.0–7.0)
HEMATOCRIT: 33.2 % — AB (ref 34.8–46.6)
HEMOGLOBIN: 9.8 g/dL — AB (ref 11.6–15.9)
LYMPH#: 3.1 10*3/uL (ref 0.9–3.3)
LYMPH%: 38.3 % (ref 14.0–49.7)
MCH: 20.2 pg — ABNORMAL LOW (ref 25.1–34.0)
MCHC: 29.7 g/dL — ABNORMAL LOW (ref 31.5–36.0)
MCV: 68.2 fL — ABNORMAL LOW (ref 79.5–101.0)
MONO#: 0.8 10*3/uL (ref 0.1–0.9)
MONO%: 9.8 % (ref 0.0–14.0)
NEUT%: 50.6 % (ref 38.4–76.8)
NEUTROS ABS: 4.1 10*3/uL (ref 1.5–6.5)
Platelets: 280 10*3/uL (ref 145–400)
RBC: 4.87 10*6/uL (ref 3.70–5.45)
RDW: 18.9 % — AB (ref 11.2–14.5)
WBC: 8.2 10*3/uL (ref 3.9–10.3)

## 2014-09-29 MED ORDER — DARBEPOETIN ALFA 300 MCG/0.6ML IJ SOSY
300.0000 ug | PREFILLED_SYRINGE | Freq: Once | INTRAMUSCULAR | Status: AC
  Administered 2014-09-29: 300 ug via SUBCUTANEOUS
  Filled 2014-09-29: qty 0.6

## 2014-10-03 ENCOUNTER — Other Ambulatory Visit (HOSPITAL_COMMUNITY): Payer: Self-pay | Admitting: *Deleted

## 2014-10-04 ENCOUNTER — Encounter (HOSPITAL_COMMUNITY)
Admission: RE | Admit: 2014-10-04 | Discharge: 2014-10-04 | Disposition: A | Discharge status: Home / Self Care | Payer: Medicare Other | Source: Ambulatory Visit | Attending: Internal Medicine | Admitting: Internal Medicine

## 2014-10-04 DIAGNOSIS — M0589 Other rheumatoid arthritis with rheumatoid factor of multiple sites: Secondary | ICD-10-CM | POA: Insufficient documentation

## 2014-10-04 MED ORDER — SODIUM CHLORIDE 0.9 % IV SOLN
1000.0000 mg | INTRAVENOUS | Status: DC
  Administered 2014-10-04: 1000 mg via INTRAVENOUS
  Filled 2014-10-04: qty 40

## 2014-10-04 MED ORDER — SODIUM CHLORIDE 0.9 % IV SOLN
INTRAVENOUS | Status: DC
  Administered 2014-10-04: 09:00:00 via INTRAVENOUS

## 2014-10-27 ENCOUNTER — Ambulatory Visit (HOSPITAL_BASED_OUTPATIENT_CLINIC_OR_DEPARTMENT_OTHER): Payer: Medicare Other

## 2014-10-27 ENCOUNTER — Other Ambulatory Visit (HOSPITAL_BASED_OUTPATIENT_CLINIC_OR_DEPARTMENT_OTHER): Payer: Medicare Other

## 2014-10-27 VITALS — BP 113/89 | HR 78 | Temp 98.4°F | Resp 19

## 2014-10-27 DIAGNOSIS — D638 Anemia in other chronic diseases classified elsewhere: Secondary | ICD-10-CM | POA: Diagnosis present

## 2014-10-27 DIAGNOSIS — N289 Disorder of kidney and ureter, unspecified: Secondary | ICD-10-CM

## 2014-10-27 DIAGNOSIS — M069 Rheumatoid arthritis, unspecified: Secondary | ICD-10-CM

## 2014-10-27 DIAGNOSIS — D509 Iron deficiency anemia, unspecified: Secondary | ICD-10-CM

## 2014-10-27 DIAGNOSIS — D569 Thalassemia, unspecified: Secondary | ICD-10-CM

## 2014-10-27 LAB — CBC WITH DIFFERENTIAL/PLATELET
BASO%: 0.4 % (ref 0.0–2.0)
Basophils Absolute: 0 10*3/uL (ref 0.0–0.1)
EOS ABS: 0.1 10*3/uL (ref 0.0–0.5)
EOS%: 0.9 % (ref 0.0–7.0)
HCT: 33.2 % — ABNORMAL LOW (ref 34.8–46.6)
HEMOGLOBIN: 10.4 g/dL — AB (ref 11.6–15.9)
LYMPH#: 3.9 10*3/uL — AB (ref 0.9–3.3)
LYMPH%: 49.9 % — AB (ref 14.0–49.7)
MCH: 21.4 pg — ABNORMAL LOW (ref 25.1–34.0)
MCHC: 31.3 g/dL — ABNORMAL LOW (ref 31.5–36.0)
MCV: 68.5 fL — ABNORMAL LOW (ref 79.5–101.0)
MONO#: 0.7 10*3/uL (ref 0.1–0.9)
MONO%: 9.6 % (ref 0.0–14.0)
NEUT%: 39.2 % (ref 38.4–76.8)
NEUTROS ABS: 3 10*3/uL (ref 1.5–6.5)
PLATELETS: 248 10*3/uL (ref 145–400)
RBC: 4.85 10*6/uL (ref 3.70–5.45)
RDW: 17.8 % — ABNORMAL HIGH (ref 11.2–14.5)
WBC: 7.7 10*3/uL (ref 3.9–10.3)
nRBC: 0 % (ref 0–0)

## 2014-10-27 MED ORDER — DARBEPOETIN ALFA 300 MCG/0.6ML IJ SOSY
300.0000 ug | PREFILLED_SYRINGE | Freq: Once | INTRAMUSCULAR | Status: AC
  Administered 2014-10-27: 300 ug via SUBCUTANEOUS
  Filled 2014-10-27: qty 0.6

## 2014-10-27 NOTE — Patient Instructions (Signed)
Darbepoetin Alfa injection What is this medicine? DARBEPOETIN ALFA (dar be POE e tin AL fa) helps your body make more red blood cells. It is used to treat anemia caused by chronic kidney failure and chemotherapy. This medicine may be used for other purposes; ask your health care provider or pharmacist if you have questions. COMMON BRAND NAME(S): Aranesp What should I tell my health care provider before I take this medicine? They need to know if you have any of these conditions: -blood clotting disorders or history of blood clots -cancer patient not on chemotherapy -cystic fibrosis -heart disease, such as angina, heart failure, or a history of a heart attack -hemoglobin level of 12 g/dL or greater -high blood pressure -low levels of folate, iron, or vitamin B12 -seizures -an unusual or allergic reaction to darbepoetin, erythropoietin, albumin, hamster proteins, latex, other medicines, foods, dyes, or preservatives -pregnant or trying to get pregnant -breast-feeding How should I use this medicine? This medicine is for injection into a vein or under the skin. It is usually given by a health care professional in a hospital or clinic setting. If you get this medicine at home, you will be taught how to prepare and give this medicine. Do not shake the solution before you withdraw a dose. Use exactly as directed. Take your medicine at regular intervals. Do not take your medicine more often than directed. It is important that you put your used needles and syringes in a special sharps container. Do not put them in a trash can. If you do not have a sharps container, call your pharmacist or healthcare provider to get one. Talk to your pediatrician regarding the use of this medicine in children. While this medicine may be used in children as young as 1 year for selected conditions, precautions do apply. Overdosage: If you think you have taken too much of this medicine contact a poison control center or  emergency room at once. NOTE: This medicine is only for you. Do not share this medicine with others. What if I miss a dose? If you miss a dose, take it as soon as you can. If it is almost time for your next dose, take only that dose. Do not take double or extra doses. What may interact with this medicine? Do not take this medicine with any of the following medications: -epoetin alfa This list may not describe all possible interactions. Give your health care provider a list of all the medicines, herbs, non-prescription drugs, or dietary supplements you use. Also tell them if you smoke, drink alcohol, or use illegal drugs. Some items may interact with your medicine. What should I watch for while using this medicine? Visit your prescriber or health care professional for regular checks on your progress and for the needed blood tests and blood pressure measurements. It is especially important for the doctor to make sure your hemoglobin level is in the desired range, to limit the risk of potential side effects and to give you the best benefit. Keep all appointments for any recommended tests. Check your blood pressure as directed. Ask your doctor what your blood pressure should be and when you should contact him or her. As your body makes more red blood cells, you may need to take iron, folic acid, or vitamin B supplements. Ask your doctor or health care provider which products are right for you. If you have kidney disease continue dietary restrictions, even though this medication can make you feel better. Talk with your doctor or health   care professional about the foods you eat and the vitamins that you take. What side effects may I notice from receiving this medicine? Side effects that you should report to your doctor or health care professional as soon as possible: -allergic reactions like skin rash, itching or hives, swelling of the face, lips, or tongue -breathing problems -changes in vision -chest  pain -confusion, trouble speaking or understanding -feeling faint or lightheaded, falls -high blood pressure -muscle aches or pains -pain, swelling, warmth in the leg -rapid weight gain -severe headaches -sudden numbness or weakness of the face, arm or leg -trouble walking, dizziness, loss of balance or coordination -seizures (convulsions) -swelling of the ankles, feet, hands -unusually weak or tired Side effects that usually do not require medical attention (report to your doctor or health care professional if they continue or are bothersome): -diarrhea -fever, chills (flu-like symptoms) -headaches -nausea, vomiting -redness, stinging, or swelling at site where injected This list may not describe all possible side effects. Call your doctor for medical advice about side effects. You may report side effects to FDA at 1-800-FDA-1088. Where should I keep my medicine? Keep out of the reach of children. Store in a refrigerator between 2 and 8 degrees C (36 and 46 degrees F). Do not freeze. Do not shake. Throw away any unused portion if using a single-dose vial. Throw away any unused medicine after the expiration date. NOTE: This sheet is a summary. It may not cover all possible information. If you have questions about this medicine, talk to your doctor, pharmacist, or health care provider.  2015, [employer]. (2008-05-10 10:23:57)  

## 2014-11-01 ENCOUNTER — Encounter (HOSPITAL_COMMUNITY): Payer: Medicare Other

## 2014-11-01 ENCOUNTER — Ambulatory Visit (INDEPENDENT_AMBULATORY_CARE_PROVIDER_SITE_OTHER): Payer: Medicare Other | Admitting: Ophthalmology

## 2014-11-01 DIAGNOSIS — I10 Essential (primary) hypertension: Secondary | ICD-10-CM

## 2014-11-01 DIAGNOSIS — E11311 Type 2 diabetes mellitus with unspecified diabetic retinopathy with macular edema: Secondary | ICD-10-CM

## 2014-11-01 DIAGNOSIS — E11339 Type 2 diabetes mellitus with moderate nonproliferative diabetic retinopathy without macular edema: Secondary | ICD-10-CM | POA: Diagnosis not present

## 2014-11-01 DIAGNOSIS — H2513 Age-related nuclear cataract, bilateral: Secondary | ICD-10-CM | POA: Diagnosis not present

## 2014-11-01 DIAGNOSIS — H43813 Vitreous degeneration, bilateral: Secondary | ICD-10-CM | POA: Diagnosis not present

## 2014-11-01 DIAGNOSIS — H35033 Hypertensive retinopathy, bilateral: Secondary | ICD-10-CM

## 2014-11-04 ENCOUNTER — Other Ambulatory Visit (HOSPITAL_COMMUNITY): Payer: Self-pay | Admitting: *Deleted

## 2014-11-08 ENCOUNTER — Encounter (HOSPITAL_COMMUNITY)
Admission: RE | Admit: 2014-11-08 | Discharge: 2014-11-08 | Disposition: A | Discharge status: Home / Self Care | Payer: Medicare Other | Source: Ambulatory Visit | Attending: Internal Medicine | Admitting: Internal Medicine

## 2014-11-08 DIAGNOSIS — M069 Rheumatoid arthritis, unspecified: Secondary | ICD-10-CM | POA: Insufficient documentation

## 2014-11-08 MED ORDER — SODIUM CHLORIDE 0.9 % IV SOLN
INTRAVENOUS | Status: DC
  Administered 2014-11-08: 10:00:00 via INTRAVENOUS

## 2014-11-08 MED ORDER — SODIUM CHLORIDE 0.9 % IV SOLN
1000.0000 mg | INTRAVENOUS | Status: DC
  Administered 2014-11-08: 1000 mg via INTRAVENOUS
  Filled 2014-11-08: qty 40

## 2014-11-22 DIAGNOSIS — I5033 Acute on chronic diastolic (congestive) heart failure: Secondary | ICD-10-CM | POA: Diagnosis not present

## 2014-11-22 DIAGNOSIS — R0602 Shortness of breath: Secondary | ICD-10-CM | POA: Diagnosis not present

## 2014-11-24 ENCOUNTER — Other Ambulatory Visit (HOSPITAL_BASED_OUTPATIENT_CLINIC_OR_DEPARTMENT_OTHER): Payer: Medicare Other

## 2014-11-24 ENCOUNTER — Ambulatory Visit (HOSPITAL_BASED_OUTPATIENT_CLINIC_OR_DEPARTMENT_OTHER): Payer: Medicare Other

## 2014-11-24 VITALS — BP 150/63 | HR 78 | Temp 98.9°F

## 2014-11-24 DIAGNOSIS — M069 Rheumatoid arthritis, unspecified: Secondary | ICD-10-CM

## 2014-11-24 DIAGNOSIS — D509 Iron deficiency anemia, unspecified: Secondary | ICD-10-CM

## 2014-11-24 DIAGNOSIS — D638 Anemia in other chronic diseases classified elsewhere: Secondary | ICD-10-CM | POA: Diagnosis present

## 2014-11-24 DIAGNOSIS — D569 Thalassemia, unspecified: Secondary | ICD-10-CM

## 2014-11-24 DIAGNOSIS — N289 Disorder of kidney and ureter, unspecified: Secondary | ICD-10-CM

## 2014-11-24 LAB — CBC WITH DIFFERENTIAL/PLATELET
BASO%: 0.6 % (ref 0.0–2.0)
BASOS ABS: 0 10*3/uL (ref 0.0–0.1)
EOS ABS: 0.1 10*3/uL (ref 0.0–0.5)
EOS%: 1.1 % (ref 0.0–7.0)
HEMATOCRIT: 33.3 % — AB (ref 34.8–46.6)
HEMOGLOBIN: 10.1 g/dL — AB (ref 11.6–15.9)
LYMPH%: 31.9 % (ref 14.0–49.7)
MCH: 21 pg — AB (ref 25.1–34.0)
MCHC: 30.4 g/dL — ABNORMAL LOW (ref 31.5–36.0)
MCV: 69.3 fL — ABNORMAL LOW (ref 79.5–101.0)
MONO#: 0.6 10*3/uL (ref 0.1–0.9)
MONO%: 9.1 % (ref 0.0–14.0)
NEUT#: 3.7 10*3/uL (ref 1.5–6.5)
NEUT%: 57.3 % (ref 38.4–76.8)
Platelets: 319 10*3/uL (ref 145–400)
RBC: 4.8 10*6/uL (ref 3.70–5.45)
RDW: 18.4 % — AB (ref 11.2–14.5)
WBC: 6.5 10*3/uL (ref 3.9–10.3)
lymph#: 2.1 10*3/uL (ref 0.9–3.3)

## 2014-11-24 MED ORDER — DARBEPOETIN ALFA 300 MCG/0.6ML IJ SOSY
300.0000 ug | PREFILLED_SYRINGE | Freq: Once | INTRAMUSCULAR | Status: AC
  Administered 2014-11-24: 300 ug via SUBCUTANEOUS
  Filled 2014-11-24: qty 0.6

## 2014-12-02 DIAGNOSIS — R0602 Shortness of breath: Secondary | ICD-10-CM | POA: Diagnosis not present

## 2014-12-06 ENCOUNTER — Encounter (HOSPITAL_COMMUNITY)
Admission: RE | Admit: 2014-12-06 | Discharge: 2014-12-06 | Disposition: A | Discharge status: Home / Self Care | Payer: Medicare Other | Source: Ambulatory Visit | Attending: Internal Medicine | Admitting: Internal Medicine

## 2014-12-06 DIAGNOSIS — M0589 Other rheumatoid arthritis with rheumatoid factor of multiple sites: Secondary | ICD-10-CM | POA: Diagnosis not present

## 2014-12-06 MED ORDER — SODIUM CHLORIDE 0.9 % IV SOLN
1000.0000 mg | INTRAVENOUS | Status: DC
  Administered 2014-12-06: 1000 mg via INTRAVENOUS
  Filled 2014-12-06: qty 40

## 2014-12-06 MED ORDER — SODIUM CHLORIDE 0.9 % IV SOLN
INTRAVENOUS | Status: DC
  Administered 2014-12-06: 10:00:00 via INTRAVENOUS

## 2014-12-21 ENCOUNTER — Other Ambulatory Visit: Payer: Self-pay | Admitting: Physician Assistant

## 2014-12-22 ENCOUNTER — Ambulatory Visit: Payer: Medicare Other

## 2014-12-22 ENCOUNTER — Telehealth: Payer: Self-pay | Admitting: *Deleted

## 2014-12-22 ENCOUNTER — Other Ambulatory Visit: Payer: Medicare Other

## 2014-12-22 NOTE — Telephone Encounter (Signed)
Called patient about missed lab and injection appointment.  Left message to call back and reschedule.

## 2015-01-03 ENCOUNTER — Encounter (HOSPITAL_COMMUNITY)
Admission: RE | Admit: 2015-01-03 | Discharge: 2015-01-03 | Disposition: A | Discharge status: Home / Self Care | Payer: Medicare Other | Source: Ambulatory Visit | Attending: Internal Medicine | Admitting: Internal Medicine

## 2015-01-03 DIAGNOSIS — M0589 Other rheumatoid arthritis with rheumatoid factor of multiple sites: Secondary | ICD-10-CM | POA: Insufficient documentation

## 2015-01-03 MED ORDER — SODIUM CHLORIDE 0.9 % IV SOLN
1000.0000 mg | Freq: Once | INTRAVENOUS | Status: AC
  Administered 2015-01-03: 1000 mg via INTRAVENOUS
  Filled 2015-01-03: qty 40

## 2015-01-03 MED ORDER — SODIUM CHLORIDE 0.9 % IV SOLN
Freq: Once | INTRAVENOUS | Status: AC
  Administered 2015-01-03: 10:00:00 via INTRAVENOUS

## 2015-01-04 ENCOUNTER — Other Ambulatory Visit: Payer: Self-pay

## 2015-01-04 DIAGNOSIS — Z1231 Encounter for screening mammogram for malignant neoplasm of breast: Secondary | ICD-10-CM

## 2015-01-09 DIAGNOSIS — Z0000 Encounter for general adult medical examination without abnormal findings: Secondary | ICD-10-CM | POA: Diagnosis not present

## 2015-01-09 DIAGNOSIS — G609 Hereditary and idiopathic neuropathy, unspecified: Secondary | ICD-10-CM | POA: Diagnosis not present

## 2015-01-09 DIAGNOSIS — I10 Essential (primary) hypertension: Secondary | ICD-10-CM | POA: Diagnosis not present

## 2015-01-09 DIAGNOSIS — Z1389 Encounter for screening for other disorder: Secondary | ICD-10-CM | POA: Diagnosis not present

## 2015-01-09 DIAGNOSIS — E780 Pure hypercholesterolemia: Secondary | ICD-10-CM | POA: Diagnosis not present

## 2015-01-09 DIAGNOSIS — E109 Type 1 diabetes mellitus without complications: Secondary | ICD-10-CM | POA: Diagnosis not present

## 2015-01-16 DIAGNOSIS — I10 Essential (primary) hypertension: Secondary | ICD-10-CM | POA: Diagnosis not present

## 2015-01-16 DIAGNOSIS — M859 Disorder of bone density and structure, unspecified: Secondary | ICD-10-CM | POA: Diagnosis not present

## 2015-01-16 DIAGNOSIS — I5032 Chronic diastolic (congestive) heart failure: Secondary | ICD-10-CM | POA: Diagnosis not present

## 2015-01-16 DIAGNOSIS — E10319 Type 1 diabetes mellitus with unspecified diabetic retinopathy without macular edema: Secondary | ICD-10-CM | POA: Diagnosis not present

## 2015-01-16 DIAGNOSIS — E780 Pure hypercholesterolemia: Secondary | ICD-10-CM | POA: Diagnosis not present

## 2015-01-18 ENCOUNTER — Ambulatory Visit
Admission: RE | Admit: 2015-01-18 | Discharge: 2015-01-18 | Disposition: A | Discharge status: Home / Self Care | Payer: Medicare Other | Source: Ambulatory Visit

## 2015-01-18 DIAGNOSIS — Z1231 Encounter for screening mammogram for malignant neoplasm of breast: Secondary | ICD-10-CM

## 2015-01-19 ENCOUNTER — Ambulatory Visit (HOSPITAL_BASED_OUTPATIENT_CLINIC_OR_DEPARTMENT_OTHER): Payer: Medicare Other

## 2015-01-19 ENCOUNTER — Other Ambulatory Visit (HOSPITAL_BASED_OUTPATIENT_CLINIC_OR_DEPARTMENT_OTHER): Payer: Medicare Other

## 2015-01-19 VITALS — BP 152/67 | HR 67 | Temp 98.7°F

## 2015-01-19 DIAGNOSIS — D638 Anemia in other chronic diseases classified elsewhere: Secondary | ICD-10-CM

## 2015-01-19 DIAGNOSIS — D569 Thalassemia, unspecified: Secondary | ICD-10-CM

## 2015-01-19 DIAGNOSIS — D509 Iron deficiency anemia, unspecified: Secondary | ICD-10-CM

## 2015-01-19 DIAGNOSIS — N289 Disorder of kidney and ureter, unspecified: Secondary | ICD-10-CM

## 2015-01-19 DIAGNOSIS — M069 Rheumatoid arthritis, unspecified: Secondary | ICD-10-CM

## 2015-01-19 LAB — CBC WITH DIFFERENTIAL/PLATELET
BASO%: 0.5 % (ref 0.0–2.0)
BASOS ABS: 0 10*3/uL (ref 0.0–0.1)
EOS%: 0.6 % (ref 0.0–7.0)
Eosinophils Absolute: 0 10*3/uL (ref 0.0–0.5)
HEMATOCRIT: 34.2 % — AB (ref 34.8–46.6)
HGB: 10.6 g/dL — ABNORMAL LOW (ref 11.6–15.9)
LYMPH%: 37 % (ref 14.0–49.7)
MCH: 20.7 pg — ABNORMAL LOW (ref 25.1–34.0)
MCHC: 30.9 g/dL — ABNORMAL LOW (ref 31.5–36.0)
MCV: 66.9 fL — AB (ref 79.5–101.0)
MONO#: 0.7 10*3/uL (ref 0.1–0.9)
MONO%: 8.6 % (ref 0.0–14.0)
NEUT#: 4.5 10*3/uL (ref 1.5–6.5)
NEUT%: 53.3 % (ref 38.4–76.8)
Platelets: 299 10*3/uL (ref 145–400)
RBC: 5.11 10*6/uL (ref 3.70–5.45)
RDW: 17 % — ABNORMAL HIGH (ref 11.2–14.5)
WBC: 8.5 10*3/uL (ref 3.9–10.3)
lymph#: 3.1 10*3/uL (ref 0.9–3.3)

## 2015-01-19 MED ORDER — DARBEPOETIN ALFA 300 MCG/0.6ML IJ SOSY
300.0000 ug | PREFILLED_SYRINGE | Freq: Once | INTRAMUSCULAR | Status: AC
  Administered 2015-01-19: 300 ug via SUBCUTANEOUS
  Filled 2015-01-19: qty 0.6

## 2015-01-25 DIAGNOSIS — I10 Essential (primary) hypertension: Secondary | ICD-10-CM | POA: Diagnosis not present

## 2015-01-25 DIAGNOSIS — Z01419 Encounter for gynecological examination (general) (routine) without abnormal findings: Secondary | ICD-10-CM | POA: Diagnosis not present

## 2015-01-30 DIAGNOSIS — M150 Primary generalized (osteo)arthritis: Secondary | ICD-10-CM | POA: Diagnosis not present

## 2015-01-30 DIAGNOSIS — M549 Dorsalgia, unspecified: Secondary | ICD-10-CM | POA: Diagnosis not present

## 2015-01-30 DIAGNOSIS — D649 Anemia, unspecified: Secondary | ICD-10-CM | POA: Diagnosis not present

## 2015-01-30 DIAGNOSIS — M069 Rheumatoid arthritis, unspecified: Secondary | ICD-10-CM | POA: Diagnosis not present

## 2015-02-01 ENCOUNTER — Encounter (HOSPITAL_COMMUNITY): Payer: Medicare Other

## 2015-02-08 ENCOUNTER — Encounter (HOSPITAL_COMMUNITY)
Admission: RE | Admit: 2015-02-08 | Discharge: 2015-02-08 | Disposition: A | Discharge status: Home / Self Care | Payer: Medicare Other | Source: Ambulatory Visit | Attending: Internal Medicine | Admitting: Internal Medicine

## 2015-02-08 DIAGNOSIS — M069 Rheumatoid arthritis, unspecified: Secondary | ICD-10-CM | POA: Insufficient documentation

## 2015-02-08 MED ORDER — SODIUM CHLORIDE 0.9 % IV SOLN
1000.0000 mg | INTRAVENOUS | Status: DC
  Administered 2015-02-08: 1000 mg via INTRAVENOUS
  Filled 2015-02-08: qty 40

## 2015-02-08 MED ORDER — SODIUM CHLORIDE 0.9 % IV SOLN: INTRAVENOUS | Status: DC

## 2015-02-16 ENCOUNTER — Other Ambulatory Visit (HOSPITAL_BASED_OUTPATIENT_CLINIC_OR_DEPARTMENT_OTHER): Payer: Commercial Managed Care - HMO

## 2015-02-16 ENCOUNTER — Telehealth: Payer: Self-pay | Admitting: Oncology

## 2015-02-16 ENCOUNTER — Ambulatory Visit (HOSPITAL_BASED_OUTPATIENT_CLINIC_OR_DEPARTMENT_OTHER): Payer: Commercial Managed Care - HMO | Admitting: Oncology

## 2015-02-16 ENCOUNTER — Ambulatory Visit (HOSPITAL_BASED_OUTPATIENT_CLINIC_OR_DEPARTMENT_OTHER): Payer: Commercial Managed Care - HMO

## 2015-02-16 VITALS — BP 169/85 | HR 74

## 2015-02-16 VITALS — BP 164/84 | HR 82 | Temp 98.2°F | Resp 18 | Ht 63.0 in | Wt 298.3 lb

## 2015-02-16 DIAGNOSIS — D638 Anemia in other chronic diseases classified elsewhere: Secondary | ICD-10-CM

## 2015-02-16 DIAGNOSIS — N920 Excessive and frequent menstruation with regular cycle: Secondary | ICD-10-CM | POA: Diagnosis not present

## 2015-02-16 DIAGNOSIS — D509 Iron deficiency anemia, unspecified: Secondary | ICD-10-CM

## 2015-02-16 DIAGNOSIS — D569 Thalassemia, unspecified: Secondary | ICD-10-CM

## 2015-02-16 DIAGNOSIS — M069 Rheumatoid arthritis, unspecified: Secondary | ICD-10-CM

## 2015-02-16 DIAGNOSIS — N069 Isolated proteinuria with unspecified morphologic lesion: Secondary | ICD-10-CM | POA: Diagnosis not present

## 2015-02-16 DIAGNOSIS — D500 Iron deficiency anemia secondary to blood loss (chronic): Secondary | ICD-10-CM

## 2015-02-16 DIAGNOSIS — N289 Disorder of kidney and ureter, unspecified: Secondary | ICD-10-CM

## 2015-02-16 LAB — CBC WITH DIFFERENTIAL/PLATELET
BASO%: 0.2 % (ref 0.0–2.0)
BASOS ABS: 0 10*3/uL (ref 0.0–0.1)
EOS%: 1.1 % (ref 0.0–7.0)
Eosinophils Absolute: 0.1 10*3/uL (ref 0.0–0.5)
HCT: 33.1 % — ABNORMAL LOW (ref 34.8–46.6)
HEMOGLOBIN: 10.3 g/dL — AB (ref 11.6–15.9)
LYMPH%: 39 % (ref 14.0–49.7)
MCH: 21.2 pg — AB (ref 25.1–34.0)
MCHC: 31.1 g/dL — ABNORMAL LOW (ref 31.5–36.0)
MCV: 68.2 fL — ABNORMAL LOW (ref 79.5–101.0)
MONO#: 0.9 10*3/uL (ref 0.1–0.9)
MONO%: 10.6 % (ref 0.0–14.0)
NEUT#: 3.9 10*3/uL (ref 1.5–6.5)
NEUT%: 49.1 % (ref 38.4–76.8)
Platelets: 287 10*3/uL (ref 145–400)
RBC: 4.85 10*6/uL (ref 3.70–5.45)
RDW: 18.1 % — ABNORMAL HIGH (ref 11.2–14.5)
WBC: 8 10*3/uL (ref 3.9–10.3)
lymph#: 3.1 10*3/uL (ref 0.9–3.3)

## 2015-02-16 MED ORDER — DARBEPOETIN ALFA 300 MCG/0.6ML IJ SOSY
300.0000 ug | PREFILLED_SYRINGE | Freq: Once | INTRAMUSCULAR | Status: AC
  Administered 2015-02-16: 300 ug via SUBCUTANEOUS
  Filled 2015-02-16: qty 0.6

## 2015-02-16 NOTE — Telephone Encounter (Signed)
per pof to sch pt appt-gave pt copy of avs °

## 2015-02-16 NOTE — Progress Notes (Signed)
Hematology and Oncology Follow Up Visit  MEGHEN AKOPYAN [id number] 1961/03/16 54 y.o. 02/16/2015 9:21 AM   Principle Diagnosis: This is a 54 year old female with microcytic anemia due to beta thalassemia.  She has element of iron deficiency due to heavy menstrual bleeding. She also has element of anemia of chronic disease with history of rheumatoid arthritis.  Current therapy: Aranesp 300 g every 4 weeks to keep her hemoglobin above 11.  Interim History:  Mrs. Kegg presents today for a follow-up visit. Since her last visit, she reports no complaints. She is tolerating the Aranesp injections without difficulty.She reports that her energy level has improved.  She does have diffuse pain all over, again related to her rheumatoid arthritis.  She is still continuing to have heavy menstrual cycles lasting five days with clots at times.  She is receiving Orencia injections which have helped her rheumatoid arthritis.  She does report some irregularity associated with her bowel movements including alteration with constipation and diarrhea. Her last colonoscopy was 2 years ago. She also has a reaction when he swelling and currently being evaluated by primary care physician regarding that.  She did not report headaches or blurry vision or syncope. She did not report any chest pain or palpitation. She is not reporting nausea, vomiting, abdominal pain. She does not report any hematochezia or bleeding per rectum. Does not report any frequency urgency or hesitancy. She does not report any hematuria hematochezia or melena. Remainder of her review of systems unremarkable.   Medications: I have reviewed the patient's current medications.  Current Outpatient Prescriptions  Medication Sig Dispense Refill  . abatacept (ORENCIA) 250 MG injection Inject 1,000 mg into the vein every 28 (twenty-eight) days. Monthly Infusion    . amitriptyline (ELAVIL) 25 MG tablet Take 25 mg by mouth at bedtime.     . BD INSULIN  SYRINGE ULTRAFINE 31G X 15/64" 1 ML MISC     . BYSTOLIC 10 MG tablet   8  . chlorthalidone (HYGROTON) 25 MG tablet Take 25 mg by mouth daily.    . CRESTOR 20 MG tablet 20 mg daily.     Marland Kitchen diltiazem (CARDIZEM CD) 240 MG 24 hr capsule Take 240 mg by mouth daily.     Marland Kitchen docusate sodium 100 MG CAPS 1 tab 2 times a day while on narcotics.  STOOL SOFTENER 60 capsule 0  . fluticasone (FLONASE) 50 MCG/ACT nasal spray Place 1 spray into both nostrils daily as needed for allergies or rhinitis.    . furosemide (LASIX) 20 MG tablet Take 0.5 tablets (10 mg total) by mouth daily. 15 tablet 0  . gabapentin (NEURONTIN) 300 MG capsule Take 600 mg by mouth 3 (three) times daily.     Marland Kitchen HYDROcodone-acetaminophen (NORCO) 10-325 MG per tablet Take 1 tablet by mouth every 4 (four) hours as needed for moderate pain. 60 tablet 0  . insulin lispro (HUMALOG) 100 UNIT/ML injection Inject 40 Units into the skin 3 (three) times daily before meals. Per sliding scale    . insulin NPH (HUMULIN N,NOVOLIN N) 100 UNIT/ML injection Inject 75 Units into the skin 3 (three) times daily with meals. 75 units with breakfast; lunch 75 units; 75 units at dinner    . irbesartan (AVAPRO) 300 MG tablet Take 300 mg by mouth daily.     . Methotrexate Sodium (METHOTREXATE, PF,) 50 MG/2ML injection Take as directed    . Nebivolol HCl (BYSTOLIC) 20 MG TABS Take 20 mg by mouth daily.    Marland Kitchen  ondansetron (ZOFRAN-ODT) 4 MG disintegrating tablet   0  . ONETOUCH VERIO test strip   5  . pantoprazole (PROTONIX) 40 MG tablet Take 40 mg by mouth daily.    . polyethylene glycol (MIRALAX / GLYCOLAX) packet Take 17 g by mouth 2 (two) times daily. 60 each 0  . predniSONE (DELTASONE) 5 MG tablet Take 5 mg by mouth daily.    Marland Kitchen tiZANidine (ZANAFLEX) 4 MG tablet Take 4 mg by mouth 2 (two) times daily as needed (migraines). Limit 2 days per week    . topiramate (TOPAMAX) 100 MG tablet Take 100 mg by mouth daily.     . VOLTAREN 1 % GEL Apply 1 application topically 3  (three) times daily.      No current facility-administered medications for this visit.    Allergies:  Allergies  Allergen Reactions  . Bactrim [Sulfamethoxazole-Trimethoprim] Hives  . Cefuroxime Axetil Itching  . Cephalosporins Itching  . Lisinopril Cough  . Penicillins Hives  . Sulfa Antibiotics Hives  . Iohexol Itching and Rash     Code: RASH, Desc: HAD ITCHING AND A RASH ABOUT ONE HOUR AFTER RETURNING HOME FROM THE CT, Onset Date: [id number]     Past Medical History, Surgical history, Social history, and Family History were reviewed and updated.   Blood pressure 164/84, pulse 82, temperature 98.2 F (36.8 C), temperature source Oral, resp. rate 18, height 5\' 3"  (1.6 m), weight 298 lb 4.8 oz (135.308 kg), last menstrual period 12/27/2013, SpO2 100 %. ECOG: 1 General appearance: alert awake pleasant woman without distress. Obese. Head: Normocephalic, without obvious abnormality Neck: no adenopathy Lymph nodes: Cervical, supraclavicular, and axillary nodes normal. Heart:regular rate and rhythm, S1, S2 normal, no murmur, click, rub or gallop Lung:chest clear, no wheezing, rales, normal symmetric air entry Abdomin: soft, non-tender, without masses or organomegaly EXT:no erythema, induration, or nodules. 2+ edema in her lower extremities.   CBC    Component Value Date/Time   WBC 8.0 02/16/2015 0849   WBC 10.3 05/21/2014 0329   RBC 4.85 02/16/2015 0849   RBC 4.10 05/21/2014 0329   HGB 10.3* 02/16/2015 0849   HGB 8.6* 05/21/2014 0329   HCT 33.1* 02/16/2015 0849   HCT 28.2* 05/21/2014 0329   PLT 287 02/16/2015 0849   PLT 278 05/21/2014 0329   MCV 68.2* 02/16/2015 0849   MCV 68.8* 05/21/2014 0329   MCH 21.2* 02/16/2015 0849   MCH 21.0* 05/21/2014 0329   MCHC 31.1* 02/16/2015 0849   MCHC 30.5 05/21/2014 0329   RDW 18.1* 02/16/2015 0849   RDW 17.5* 05/21/2014 0329   LYMPHSABS 3.1 02/16/2015 0849   LYMPHSABS 2.5 08/26/2009 2310   MONOABS 0.9 02/16/2015 0849   MONOABS  0.7 08/26/2009 2310   EOSABS 0.1 02/16/2015 0849   EOSABS 0.1 08/26/2009 2310   BASOSABS 0.0 02/16/2015 0849   BASOSABS 0.0 08/26/2009 2310       Impression and Plan:  A 54 year old female with the following issues: 1. Microcytic anemia related to her beta thalassemia as well as iron deficiency. She appears to have an element of anemia of chronic disease as well. She is status post IV iron in September 2015. She is currently on Aranesp and have tolerated it well. The plan is to continue with Aranesp on a monthly basis to keep her hemoglobin close to 11. I will check her iron studies periodically to ensure adequate iron stores. 2. Followup will be in 6 months to assess response to Aranesp.  [facility], MD 9/8/20169:21 AM

## 2015-03-07 ENCOUNTER — Other Ambulatory Visit (HOSPITAL_COMMUNITY): Payer: Self-pay | Admitting: *Deleted

## 2015-03-08 ENCOUNTER — Encounter (HOSPITAL_COMMUNITY): Payer: Medicare Other

## 2015-03-09 ENCOUNTER — Other Ambulatory Visit: Payer: Commercial Managed Care - HMO

## 2015-03-15 ENCOUNTER — Telehealth: Payer: Self-pay | Admitting: Oncology

## 2015-03-15 NOTE — Telephone Encounter (Signed)
pt cld wanted to cancel 10/6 appt-cx appt and cld pt & left a message need to call back to r/d lab/injection appt for a more convient time

## 2015-03-16 ENCOUNTER — Ambulatory Visit: Payer: Commercial Managed Care - HMO

## 2015-03-16 ENCOUNTER — Other Ambulatory Visit: Payer: Self-pay

## 2015-03-16 ENCOUNTER — Telehealth: Payer: Self-pay | Admitting: Oncology

## 2015-03-16 ENCOUNTER — Other Ambulatory Visit: Payer: Commercial Managed Care - HMO

## 2015-03-16 NOTE — Telephone Encounter (Signed)
lvm for pt to r/s missed appt...advised to call back to r/s

## 2015-03-20 ENCOUNTER — Telehealth: Payer: Self-pay | Admitting: Hematology

## 2015-03-20 NOTE — Telephone Encounter (Signed)
Returned patient call re r/s missed lab/inj from 10/6. Gave patient new appointment for 10/13 @ 8:45 am - date per patient. After this injection has been completed patient advised to stop by scheduling to have remaining appointments adjusted.

## 2015-03-23 ENCOUNTER — Other Ambulatory Visit (HOSPITAL_BASED_OUTPATIENT_CLINIC_OR_DEPARTMENT_OTHER): Payer: Commercial Managed Care - HMO

## 2015-03-23 ENCOUNTER — Telehealth: Payer: Self-pay | Admitting: Oncology

## 2015-03-23 ENCOUNTER — Ambulatory Visit: Payer: Commercial Managed Care - HMO

## 2015-03-23 DIAGNOSIS — D509 Iron deficiency anemia, unspecified: Secondary | ICD-10-CM

## 2015-03-23 DIAGNOSIS — D569 Thalassemia, unspecified: Secondary | ICD-10-CM

## 2015-03-23 DIAGNOSIS — D638 Anemia in other chronic diseases classified elsewhere: Secondary | ICD-10-CM

## 2015-03-23 LAB — CBC WITH DIFFERENTIAL/PLATELET
BASO%: 0.3 % (ref 0.0–2.0)
Basophils Absolute: 0 10*3/uL (ref 0.0–0.1)
EOS ABS: 0.1 10*3/uL (ref 0.0–0.5)
EOS%: 1.4 % (ref 0.0–7.0)
HCT: 34.4 % — ABNORMAL LOW (ref 34.8–46.6)
HGB: 11 g/dL — ABNORMAL LOW (ref 11.6–15.9)
LYMPH%: 40.5 % (ref 14.0–49.7)
MCH: 22 pg — ABNORMAL LOW (ref 25.1–34.0)
MCHC: 32 g/dL (ref 31.5–36.0)
MCV: 68.7 fL — ABNORMAL LOW (ref 79.5–101.0)
MONO#: 0.7 10*3/uL (ref 0.1–0.9)
MONO%: 8.6 % (ref 0.0–14.0)
NEUT%: 49.2 % (ref 38.4–76.8)
NEUTROS ABS: 4.3 10*3/uL (ref 1.5–6.5)
PLATELETS: 293 10*3/uL (ref 145–400)
RBC: 5.01 10*6/uL (ref 3.70–5.45)
RDW: 18.1 % — AB (ref 11.2–14.5)
WBC: 8.6 10*3/uL (ref 3.9–10.3)
lymph#: 3.5 10*3/uL — ABNORMAL HIGH (ref 0.9–3.3)
nRBC: 0 % (ref 0–0)

## 2015-03-23 NOTE — Progress Notes (Signed)
Hgb 11.0 - no aranesp injection.  Pt notified.

## 2015-03-23 NOTE — Telephone Encounter (Signed)
pt wanted copy of updated sch

## 2015-03-29 ENCOUNTER — Other Ambulatory Visit (HOSPITAL_COMMUNITY): Payer: Self-pay | Admitting: *Deleted

## 2015-03-30 ENCOUNTER — Encounter (HOSPITAL_COMMUNITY): Admission: RE | Admit: 2015-03-30 | Payer: Commercial Managed Care - HMO | Source: Ambulatory Visit

## 2015-03-30 ENCOUNTER — Ambulatory Visit: Payer: Commercial Managed Care - HMO | Admitting: Hematology

## 2015-03-30 ENCOUNTER — Other Ambulatory Visit: Payer: Commercial Managed Care - HMO

## 2015-03-30 DIAGNOSIS — M069 Rheumatoid arthritis, unspecified: Secondary | ICD-10-CM | POA: Insufficient documentation

## 2015-04-03 ENCOUNTER — Encounter (HOSPITAL_COMMUNITY)
Admission: RE | Admit: 2015-04-03 | Discharge: 2015-04-03 | Disposition: A | Discharge status: Home / Self Care | Payer: Commercial Managed Care - HMO | Source: Ambulatory Visit | Attending: Internal Medicine | Admitting: Internal Medicine

## 2015-04-03 DIAGNOSIS — M069 Rheumatoid arthritis, unspecified: Secondary | ICD-10-CM | POA: Diagnosis not present

## 2015-04-03 MED ORDER — SODIUM CHLORIDE 0.9 % IV SOLN
1000.0000 mg | INTRAVENOUS | Status: DC
  Administered 2015-04-03: 1000 mg via INTRAVENOUS
  Filled 2015-04-03: qty 40

## 2015-04-03 MED ORDER — SODIUM CHLORIDE 0.9 % IV SOLN
INTRAVENOUS | Status: DC
  Administered 2015-04-03: 11:00:00 via INTRAVENOUS

## 2015-04-11 ENCOUNTER — Telehealth: Payer: Self-pay | Admitting: Oncology

## 2015-04-11 NOTE — Telephone Encounter (Signed)
Patient called and moved 11/3 lab/inj from AM to PM.

## 2015-04-13 ENCOUNTER — Other Ambulatory Visit (HOSPITAL_BASED_OUTPATIENT_CLINIC_OR_DEPARTMENT_OTHER): Payer: Commercial Managed Care - HMO

## 2015-04-13 ENCOUNTER — Ambulatory Visit (HOSPITAL_BASED_OUTPATIENT_CLINIC_OR_DEPARTMENT_OTHER): Payer: Commercial Managed Care - HMO

## 2015-04-13 ENCOUNTER — Other Ambulatory Visit: Payer: Self-pay | Admitting: Oncology

## 2015-04-13 VITALS — BP 136/83 | HR 94 | Temp 98.8°F

## 2015-04-13 DIAGNOSIS — D638 Anemia in other chronic diseases classified elsewhere: Secondary | ICD-10-CM | POA: Diagnosis not present

## 2015-04-13 DIAGNOSIS — M069 Rheumatoid arthritis, unspecified: Secondary | ICD-10-CM | POA: Diagnosis not present

## 2015-04-13 DIAGNOSIS — D569 Thalassemia, unspecified: Secondary | ICD-10-CM

## 2015-04-13 DIAGNOSIS — N289 Disorder of kidney and ureter, unspecified: Secondary | ICD-10-CM

## 2015-04-13 DIAGNOSIS — D509 Iron deficiency anemia, unspecified: Secondary | ICD-10-CM

## 2015-04-13 LAB — CBC WITH DIFFERENTIAL/PLATELET
BASO%: 0.8 % (ref 0.0–2.0)
Basophils Absolute: 0.1 10*3/uL (ref 0.0–0.1)
EOS%: 1.5 % (ref 0.0–7.0)
Eosinophils Absolute: 0.1 10*3/uL (ref 0.0–0.5)
HCT: 31.8 % — ABNORMAL LOW (ref 34.8–46.6)
HGB: 9.8 g/dL — ABNORMAL LOW (ref 11.6–15.9)
LYMPH#: 2.6 10*3/uL (ref 0.9–3.3)
LYMPH%: 33.9 % (ref 14.0–49.7)
MCH: 21.3 pg — AB (ref 25.1–34.0)
MCHC: 30.9 g/dL — AB (ref 31.5–36.0)
MCV: 68.7 fL — ABNORMAL LOW (ref 79.5–101.0)
MONO#: 0.7 10*3/uL (ref 0.1–0.9)
MONO%: 9.1 % (ref 0.0–14.0)
NEUT#: 4.2 10*3/uL (ref 1.5–6.5)
NEUT%: 54.7 % (ref 38.4–76.8)
Platelets: 321 10*3/uL (ref 145–400)
RBC: 4.63 10*6/uL (ref 3.70–5.45)
RDW: 18.1 % — ABNORMAL HIGH (ref 11.2–14.5)
WBC: 7.8 10*3/uL (ref 3.9–10.3)

## 2015-04-13 MED ORDER — DARBEPOETIN ALFA 300 MCG/0.6ML IJ SOSY
300.0000 ug | PREFILLED_SYRINGE | Freq: Once | INTRAMUSCULAR | Status: AC
  Administered 2015-04-13: 300 ug via SUBCUTANEOUS
  Filled 2015-04-13: qty 0.6

## 2015-05-01 ENCOUNTER — Other Ambulatory Visit (HOSPITAL_COMMUNITY): Payer: Self-pay | Admitting: *Deleted

## 2015-05-02 ENCOUNTER — Ambulatory Visit (HOSPITAL_COMMUNITY)
Admission: RE | Admit: 2015-05-02 | Discharge: 2015-05-02 | Disposition: A | Discharge status: Home / Self Care | Payer: Commercial Managed Care - HMO | Source: Ambulatory Visit | Attending: Internal Medicine | Admitting: Internal Medicine

## 2015-05-02 DIAGNOSIS — M069 Rheumatoid arthritis, unspecified: Secondary | ICD-10-CM | POA: Diagnosis present

## 2015-05-02 MED ORDER — SODIUM CHLORIDE 0.9 % IV SOLN
INTRAVENOUS | Status: DC
  Administered 2015-05-02: 10:00:00 via INTRAVENOUS

## 2015-05-02 MED ORDER — SODIUM CHLORIDE 0.9 % IV SOLN
1000.0000 mg | INTRAVENOUS | Status: DC
  Administered 2015-05-02: 1000 mg via INTRAVENOUS
  Filled 2015-05-02: qty 40

## 2015-05-08 ENCOUNTER — Ambulatory Visit (INDEPENDENT_AMBULATORY_CARE_PROVIDER_SITE_OTHER): Payer: Commercial Managed Care - HMO | Admitting: Ophthalmology

## 2015-05-11 ENCOUNTER — Other Ambulatory Visit (HOSPITAL_BASED_OUTPATIENT_CLINIC_OR_DEPARTMENT_OTHER): Payer: Medicare Other

## 2015-05-11 ENCOUNTER — Ambulatory Visit (HOSPITAL_BASED_OUTPATIENT_CLINIC_OR_DEPARTMENT_OTHER): Payer: Medicare Other

## 2015-05-11 VITALS — BP 153/88 | HR 100 | Temp 98.1°F | Resp 22

## 2015-05-11 DIAGNOSIS — M069 Rheumatoid arthritis, unspecified: Secondary | ICD-10-CM

## 2015-05-11 DIAGNOSIS — N289 Disorder of kidney and ureter, unspecified: Secondary | ICD-10-CM

## 2015-05-11 DIAGNOSIS — D509 Iron deficiency anemia, unspecified: Secondary | ICD-10-CM | POA: Diagnosis present

## 2015-05-11 DIAGNOSIS — D638 Anemia in other chronic diseases classified elsewhere: Secondary | ICD-10-CM

## 2015-05-11 DIAGNOSIS — D569 Thalassemia, unspecified: Secondary | ICD-10-CM

## 2015-05-11 LAB — CBC WITH DIFFERENTIAL/PLATELET
BASO%: 0.5 % (ref 0.0–2.0)
BASOS ABS: 0 10*3/uL (ref 0.0–0.1)
EOS ABS: 0.1 10*3/uL (ref 0.0–0.5)
EOS%: 1.9 % (ref 0.0–7.0)
HCT: 32.2 % — ABNORMAL LOW (ref 34.8–46.6)
HGB: 10.3 g/dL — ABNORMAL LOW (ref 11.6–15.9)
LYMPH%: 46.5 % (ref 14.0–49.7)
MCH: 22 pg — ABNORMAL LOW (ref 25.1–34.0)
MCHC: 32 g/dL (ref 31.5–36.0)
MCV: 68.7 fL — AB (ref 79.5–101.0)
MONO#: 0.7 10*3/uL (ref 0.1–0.9)
MONO%: 12.4 % (ref 0.0–14.0)
NEUT#: 2.2 10*3/uL (ref 1.5–6.5)
NEUT%: 38.7 % (ref 38.4–76.8)
PLATELETS: 275 10*3/uL (ref 145–400)
RBC: 4.69 10*6/uL (ref 3.70–5.45)
RDW: 17.8 % — ABNORMAL HIGH (ref 11.2–14.5)
WBC: 5.7 10*3/uL (ref 3.9–10.3)
lymph#: 2.6 10*3/uL (ref 0.9–3.3)
nRBC: 0 % (ref 0–0)

## 2015-05-11 MED ORDER — DARBEPOETIN ALFA 300 MCG/0.6ML IJ SOSY
300.0000 ug | PREFILLED_SYRINGE | Freq: Once | INTRAMUSCULAR | Status: AC
  Administered 2015-05-11: 300 ug via SUBCUTANEOUS
  Filled 2015-05-11: qty 0.6

## 2015-05-11 NOTE — Patient Instructions (Signed)
Darbepoetin Alfa injection What is this medicine? DARBEPOETIN ALFA (dar be POE e tin AL fa) helps your body make more red blood cells. It is used to treat anemia caused by chronic kidney failure and chemotherapy. This medicine may be used for other purposes; ask your health care provider or pharmacist if you have questions. COMMON BRAND NAME(S): Aranesp What should I tell my health care provider before I take this medicine? They need to know if you have any of these conditions: -blood clotting disorders or history of blood clots -cancer patient not on chemotherapy -cystic fibrosis -heart disease, such as angina, heart failure, or a history of a heart attack -hemoglobin level of 12 g/dL or greater -high blood pressure -low levels of folate, iron, or vitamin B12 -seizures -an unusual or allergic reaction to darbepoetin, erythropoietin, albumin, hamster proteins, latex, other medicines, foods, dyes, or preservatives -pregnant or trying to get pregnant -breast-feeding How should I use this medicine? This medicine is for injection into a vein or under the skin. It is usually given by a health care professional in a hospital or clinic setting. If you get this medicine at home, you will be taught how to prepare and give this medicine. Do not shake the solution before you withdraw a dose. Use exactly as directed. Take your medicine at regular intervals. Do not take your medicine more often than directed. It is important that you put your used needles and syringes in a special sharps container. Do not put them in a trash can. If you do not have a sharps container, call your pharmacist or healthcare provider to get one. Talk to your pediatrician regarding the use of this medicine in children. While this medicine may be used in children as young as 1 year for selected conditions, precautions do apply. Overdosage: If you think you have taken too much of this medicine contact a poison control center or  emergency room at once. NOTE: This medicine is only for you. Do not share this medicine with others. What if I miss a dose? If you miss a dose, take it as soon as you can. If it is almost time for your next dose, take only that dose. Do not take double or extra doses. What may interact with this medicine? Do not take this medicine with any of the following medications: -epoetin alfa This list may not describe all possible interactions. Give your health care provider a list of all the medicines, herbs, non-prescription drugs, or dietary supplements you use. Also tell them if you smoke, drink alcohol, or use illegal drugs. Some items may interact with your medicine. What should I watch for while using this medicine? Visit your prescriber or health care professional for regular checks on your progress and for the needed blood tests and blood pressure measurements. It is especially important for the doctor to make sure your hemoglobin level is in the desired range, to limit the risk of potential side effects and to give you the best benefit. Keep all appointments for any recommended tests. Check your blood pressure as directed. Ask your doctor what your blood pressure should be and when you should contact him or her. As your body makes more red blood cells, you may need to take iron, folic acid, or vitamin B supplements. Ask your doctor or health care provider which products are right for you. If you have kidney disease continue dietary restrictions, even though this medication can make you feel better. Talk with your doctor or health   care professional about the foods you eat and the vitamins that you take. What side effects may I notice from receiving this medicine? Side effects that you should report to your doctor or health care professional as soon as possible: -allergic reactions like skin rash, itching or hives, swelling of the face, lips, or tongue -breathing problems -changes in vision -chest  pain -confusion, trouble speaking or understanding -feeling faint or lightheaded, falls -high blood pressure -muscle aches or pains -pain, swelling, warmth in the leg -rapid weight gain -severe headaches -sudden numbness or weakness of the face, arm or leg -trouble walking, dizziness, loss of balance or coordination -seizures (convulsions) -swelling of the ankles, feet, hands -unusually weak or tired Side effects that usually do not require medical attention (report to your doctor or health care professional if they continue or are bothersome): -diarrhea -fever, chills (flu-like symptoms) -headaches -nausea, vomiting -redness, stinging, or swelling at site where injected This list may not describe all possible side effects. Call your doctor for medical advice about side effects. You may report side effects to FDA at 1-800-FDA-1088. Where should I keep my medicine? Keep out of the reach of children. Store in a refrigerator between 2 and 8 degrees C (36 and 46 degrees F). Do not freeze. Do not shake. Throw away any unused portion if using a single-dose vial. Throw away any unused medicine after the expiration date. NOTE: This sheet is a summary. It may not cover all possible information. If you have questions about this medicine, talk to your doctor, pharmacist, or health care provider.  2015, [employer]. (2008-05-10 10:23:57)  

## 2015-05-12 DIAGNOSIS — G43839 Menstrual migraine, intractable, without status migrainosus: Secondary | ICD-10-CM | POA: Diagnosis not present

## 2015-05-12 DIAGNOSIS — G43719 Chronic migraine without aura, intractable, without status migrainosus: Secondary | ICD-10-CM | POA: Diagnosis not present

## 2015-05-12 DIAGNOSIS — M5416 Radiculopathy, lumbar region: Secondary | ICD-10-CM | POA: Diagnosis not present

## 2015-05-12 DIAGNOSIS — M791 Myalgia: Secondary | ICD-10-CM | POA: Diagnosis not present

## 2015-05-12 DIAGNOSIS — R51 Headache: Secondary | ICD-10-CM | POA: Diagnosis not present

## 2015-05-12 DIAGNOSIS — M542 Cervicalgia: Secondary | ICD-10-CM | POA: Diagnosis not present

## 2015-05-12 DIAGNOSIS — G518 Other disorders of facial nerve: Secondary | ICD-10-CM | POA: Diagnosis not present

## 2015-05-16 ENCOUNTER — Other Ambulatory Visit: Payer: Self-pay | Admitting: Orthopedic Surgery

## 2015-05-16 DIAGNOSIS — M5416 Radiculopathy, lumbar region: Secondary | ICD-10-CM | POA: Diagnosis not present

## 2015-05-16 DIAGNOSIS — I10 Essential (primary) hypertension: Secondary | ICD-10-CM | POA: Diagnosis not present

## 2015-05-16 DIAGNOSIS — E7800 Pure hypercholesterolemia, unspecified: Secondary | ICD-10-CM | POA: Diagnosis not present

## 2015-05-16 DIAGNOSIS — I5032 Chronic diastolic (congestive) heart failure: Secondary | ICD-10-CM | POA: Diagnosis not present

## 2015-05-16 DIAGNOSIS — M545 Low back pain: Secondary | ICD-10-CM

## 2015-05-17 DIAGNOSIS — M5416 Radiculopathy, lumbar region: Secondary | ICD-10-CM | POA: Diagnosis not present

## 2015-05-19 DIAGNOSIS — M5416 Radiculopathy, lumbar region: Secondary | ICD-10-CM | POA: Diagnosis not present

## 2015-05-22 ENCOUNTER — Ambulatory Visit (INDEPENDENT_AMBULATORY_CARE_PROVIDER_SITE_OTHER): Payer: Commercial Managed Care - HMO | Admitting: Ophthalmology

## 2015-05-22 DIAGNOSIS — I5032 Chronic diastolic (congestive) heart failure: Secondary | ICD-10-CM | POA: Diagnosis not present

## 2015-05-22 DIAGNOSIS — I10 Essential (primary) hypertension: Secondary | ICD-10-CM | POA: Diagnosis not present

## 2015-05-22 DIAGNOSIS — E7800 Pure hypercholesterolemia, unspecified: Secondary | ICD-10-CM | POA: Diagnosis not present

## 2015-05-24 ENCOUNTER — Ambulatory Visit (INDEPENDENT_AMBULATORY_CARE_PROVIDER_SITE_OTHER): Payer: Medicare Other | Admitting: Ophthalmology

## 2015-05-24 DIAGNOSIS — E113311 Type 2 diabetes mellitus with moderate nonproliferative diabetic retinopathy with macular edema, right eye: Secondary | ICD-10-CM

## 2015-05-24 DIAGNOSIS — H2513 Age-related nuclear cataract, bilateral: Secondary | ICD-10-CM

## 2015-05-24 DIAGNOSIS — E113392 Type 2 diabetes mellitus with moderate nonproliferative diabetic retinopathy without macular edema, left eye: Secondary | ICD-10-CM | POA: Diagnosis not present

## 2015-05-24 DIAGNOSIS — H43813 Vitreous degeneration, bilateral: Secondary | ICD-10-CM | POA: Diagnosis not present

## 2015-05-24 DIAGNOSIS — I10 Essential (primary) hypertension: Secondary | ICD-10-CM

## 2015-05-24 DIAGNOSIS — H35033 Hypertensive retinopathy, bilateral: Secondary | ICD-10-CM | POA: Diagnosis not present

## 2015-05-24 DIAGNOSIS — E11311 Type 2 diabetes mellitus with unspecified diabetic retinopathy with macular edema: Secondary | ICD-10-CM | POA: Diagnosis not present

## 2015-05-27 ENCOUNTER — Ambulatory Visit
Admission: RE | Admit: 2015-05-27 | Discharge: 2015-05-27 | Disposition: A | Discharge status: Home / Self Care | Payer: Medicare Other | Source: Ambulatory Visit | Attending: Orthopedic Surgery | Admitting: Orthopedic Surgery

## 2015-05-27 DIAGNOSIS — M545 Low back pain: Secondary | ICD-10-CM

## 2015-05-27 DIAGNOSIS — M4806 Spinal stenosis, lumbar region: Secondary | ICD-10-CM | POA: Diagnosis not present

## 2015-05-29 ENCOUNTER — Other Ambulatory Visit (HOSPITAL_COMMUNITY): Payer: Self-pay | Admitting: *Deleted

## 2015-05-29 DIAGNOSIS — M5416 Radiculopathy, lumbar region: Secondary | ICD-10-CM | POA: Diagnosis not present

## 2015-05-30 ENCOUNTER — Ambulatory Visit (HOSPITAL_COMMUNITY)
Admission: RE | Admit: 2015-05-30 | Discharge: 2015-05-30 | Disposition: A | Discharge status: Home / Self Care | Payer: Medicare Other | Source: Ambulatory Visit | Attending: Rheumatology | Admitting: Rheumatology

## 2015-05-30 DIAGNOSIS — M542 Cervicalgia: Secondary | ICD-10-CM | POA: Diagnosis not present

## 2015-05-30 DIAGNOSIS — R51 Headache: Secondary | ICD-10-CM | POA: Diagnosis not present

## 2015-05-30 DIAGNOSIS — G518 Other disorders of facial nerve: Secondary | ICD-10-CM | POA: Diagnosis not present

## 2015-05-30 DIAGNOSIS — M069 Rheumatoid arthritis, unspecified: Secondary | ICD-10-CM | POA: Diagnosis not present

## 2015-05-30 DIAGNOSIS — G43719 Chronic migraine without aura, intractable, without status migrainosus: Secondary | ICD-10-CM | POA: Diagnosis not present

## 2015-05-30 DIAGNOSIS — G43839 Menstrual migraine, intractable, without status migrainosus: Secondary | ICD-10-CM | POA: Diagnosis not present

## 2015-05-30 DIAGNOSIS — M791 Myalgia: Secondary | ICD-10-CM | POA: Diagnosis not present

## 2015-05-30 MED ORDER — SODIUM CHLORIDE 0.9 % IV SOLN
1000.0000 mg | INTRAVENOUS | Status: DC
  Administered 2015-05-30: 1000 mg via INTRAVENOUS
  Filled 2015-05-30: qty 40

## 2015-05-30 MED ORDER — SODIUM CHLORIDE 0.9 % IV SOLN
INTRAVENOUS | Status: DC
  Administered 2015-05-30: 10:00:00 via INTRAVENOUS

## 2015-05-31 DIAGNOSIS — M5416 Radiculopathy, lumbar region: Secondary | ICD-10-CM | POA: Diagnosis not present

## 2015-06-01 DIAGNOSIS — M5416 Radiculopathy, lumbar region: Secondary | ICD-10-CM | POA: Diagnosis not present

## 2015-06-08 ENCOUNTER — Ambulatory Visit: Payer: Medicare Other

## 2015-06-08 ENCOUNTER — Other Ambulatory Visit (HOSPITAL_BASED_OUTPATIENT_CLINIC_OR_DEPARTMENT_OTHER): Payer: Medicare Other

## 2015-06-08 ENCOUNTER — Other Ambulatory Visit: Payer: Commercial Managed Care - HMO

## 2015-06-08 DIAGNOSIS — M069 Rheumatoid arthritis, unspecified: Secondary | ICD-10-CM

## 2015-06-08 DIAGNOSIS — N289 Disorder of kidney and ureter, unspecified: Secondary | ICD-10-CM

## 2015-06-08 DIAGNOSIS — D509 Iron deficiency anemia, unspecified: Secondary | ICD-10-CM

## 2015-06-08 DIAGNOSIS — D569 Thalassemia, unspecified: Secondary | ICD-10-CM

## 2015-06-08 LAB — CBC WITH DIFFERENTIAL/PLATELET
BASO%: 0.4 % (ref 0.0–2.0)
Basophils Absolute: 0 10*3/uL (ref 0.0–0.1)
EOS%: 0.8 % (ref 0.0–7.0)
Eosinophils Absolute: 0.1 10*3/uL (ref 0.0–0.5)
HCT: 34.9 % (ref 34.8–46.6)
HEMOGLOBIN: 11 g/dL — AB (ref 11.6–15.9)
LYMPH#: 2.6 10*3/uL (ref 0.9–3.3)
LYMPH%: 33.2 % (ref 14.0–49.7)
MCH: 21.7 pg — AB (ref 25.1–34.0)
MCHC: 31.5 g/dL (ref 31.5–36.0)
MCV: 69 fL — AB (ref 79.5–101.0)
MONO#: 0.7 10*3/uL (ref 0.1–0.9)
MONO%: 8.6 % (ref 0.0–14.0)
NEUT#: 4.4 10*3/uL (ref 1.5–6.5)
NEUT%: 57 % (ref 38.4–76.8)
Platelets: 267 10*3/uL (ref 145–400)
RBC: 5.06 10*6/uL (ref 3.70–5.45)
RDW: 17.5 % — ABNORMAL HIGH (ref 11.2–14.5)
WBC: 7.7 10*3/uL (ref 3.9–10.3)
nRBC: 0 % (ref 0–0)

## 2015-06-08 MED ORDER — DARBEPOETIN ALFA 300 MCG/0.6ML IJ SOSY: 300.0000 ug | PREFILLED_SYRINGE | Freq: Once | INTRAMUSCULAR | Status: DC

## 2015-06-14 DIAGNOSIS — G43839 Menstrual migraine, intractable, without status migrainosus: Secondary | ICD-10-CM | POA: Diagnosis not present

## 2015-06-14 DIAGNOSIS — G43719 Chronic migraine without aura, intractable, without status migrainosus: Secondary | ICD-10-CM | POA: Diagnosis not present

## 2015-06-14 DIAGNOSIS — M542 Cervicalgia: Secondary | ICD-10-CM | POA: Diagnosis not present

## 2015-06-14 DIAGNOSIS — M791 Myalgia: Secondary | ICD-10-CM | POA: Diagnosis not present

## 2015-06-14 DIAGNOSIS — R51 Headache: Secondary | ICD-10-CM | POA: Diagnosis not present

## 2015-06-14 DIAGNOSIS — G518 Other disorders of facial nerve: Secondary | ICD-10-CM | POA: Diagnosis not present

## 2015-06-26 ENCOUNTER — Other Ambulatory Visit (HOSPITAL_COMMUNITY): Payer: Self-pay | Admitting: *Deleted

## 2015-06-27 ENCOUNTER — Ambulatory Visit (HOSPITAL_COMMUNITY)
Admission: RE | Admit: 2015-06-27 | Discharge: 2015-06-27 | Disposition: A | Discharge status: Home / Self Care | Payer: Medicare Other | Source: Ambulatory Visit | Attending: Rheumatology | Admitting: Rheumatology

## 2015-06-27 DIAGNOSIS — M069 Rheumatoid arthritis, unspecified: Secondary | ICD-10-CM | POA: Insufficient documentation

## 2015-06-27 MED ORDER — SODIUM CHLORIDE 0.9 % IV SOLN
1000.0000 mg | INTRAVENOUS | Status: DC
  Administered 2015-06-27: 1000 mg via INTRAVENOUS
  Filled 2015-06-27: qty 40

## 2015-06-27 MED ORDER — SODIUM CHLORIDE 0.9 % IV SOLN
INTRAVENOUS | Status: DC
  Administered 2015-06-27: 09:00:00 via INTRAVENOUS

## 2015-07-05 DIAGNOSIS — M549 Dorsalgia, unspecified: Secondary | ICD-10-CM | POA: Diagnosis not present

## 2015-07-05 DIAGNOSIS — M069 Rheumatoid arthritis, unspecified: Secondary | ICD-10-CM | POA: Diagnosis not present

## 2015-07-05 DIAGNOSIS — M150 Primary generalized (osteo)arthritis: Secondary | ICD-10-CM | POA: Diagnosis not present

## 2015-07-05 DIAGNOSIS — M170 Bilateral primary osteoarthritis of knee: Secondary | ICD-10-CM | POA: Diagnosis not present

## 2015-07-05 DIAGNOSIS — D649 Anemia, unspecified: Secondary | ICD-10-CM | POA: Diagnosis not present

## 2015-07-06 ENCOUNTER — Ambulatory Visit (HOSPITAL_BASED_OUTPATIENT_CLINIC_OR_DEPARTMENT_OTHER): Payer: Medicare Other

## 2015-07-06 ENCOUNTER — Other Ambulatory Visit (HOSPITAL_BASED_OUTPATIENT_CLINIC_OR_DEPARTMENT_OTHER): Payer: Medicare Other

## 2015-07-06 VITALS — BP 152/72 | HR 78 | Temp 98.6°F

## 2015-07-06 DIAGNOSIS — D638 Anemia in other chronic diseases classified elsewhere: Secondary | ICD-10-CM

## 2015-07-06 DIAGNOSIS — D569 Thalassemia, unspecified: Secondary | ICD-10-CM

## 2015-07-06 DIAGNOSIS — M069 Rheumatoid arthritis, unspecified: Secondary | ICD-10-CM | POA: Diagnosis present

## 2015-07-06 DIAGNOSIS — D509 Iron deficiency anemia, unspecified: Secondary | ICD-10-CM

## 2015-07-06 DIAGNOSIS — N289 Disorder of kidney and ureter, unspecified: Secondary | ICD-10-CM

## 2015-07-06 LAB — CBC WITH DIFFERENTIAL/PLATELET
BASO%: 0.5 % (ref 0.0–2.0)
BASOS ABS: 0 10*3/uL (ref 0.0–0.1)
EOS%: 1.1 % (ref 0.0–7.0)
Eosinophils Absolute: 0.1 10*3/uL (ref 0.0–0.5)
HCT: 33.8 % — ABNORMAL LOW (ref 34.8–46.6)
HEMOGLOBIN: 10.6 g/dL — AB (ref 11.6–15.9)
LYMPH%: 45.3 % (ref 14.0–49.7)
MCH: 21.1 pg — AB (ref 25.1–34.0)
MCHC: 31.4 g/dL — AB (ref 31.5–36.0)
MCV: 67.3 fL — AB (ref 79.5–101.0)
MONO#: 0.5 10*3/uL (ref 0.1–0.9)
MONO%: 6.4 % (ref 0.0–14.0)
NEUT#: 3.6 10*3/uL (ref 1.5–6.5)
NEUT%: 46.7 % (ref 38.4–76.8)
Platelets: 263 10*3/uL (ref 145–400)
RBC: 5.02 10*6/uL (ref 3.70–5.45)
RDW: 17.2 % — ABNORMAL HIGH (ref 11.2–14.5)
WBC: 7.6 10*3/uL (ref 3.9–10.3)
lymph#: 3.4 10*3/uL — ABNORMAL HIGH (ref 0.9–3.3)
nRBC: 0 % (ref 0–0)

## 2015-07-06 MED ORDER — DARBEPOETIN ALFA 300 MCG/0.6ML IJ SOSY
300.0000 ug | PREFILLED_SYRINGE | Freq: Once | INTRAMUSCULAR | Status: AC
  Administered 2015-07-06: 300 ug via SUBCUTANEOUS
  Filled 2015-07-06: qty 0.6

## 2015-07-10 DIAGNOSIS — Z6841 Body Mass Index (BMI) 40.0 and over, adult: Secondary | ICD-10-CM | POA: Diagnosis not present

## 2015-07-10 DIAGNOSIS — I10 Essential (primary) hypertension: Secondary | ICD-10-CM | POA: Diagnosis not present

## 2015-07-10 DIAGNOSIS — M4806 Spinal stenosis, lumbar region: Secondary | ICD-10-CM | POA: Diagnosis not present

## 2015-07-18 ENCOUNTER — Other Ambulatory Visit: Payer: Self-pay | Admitting: Oncology

## 2015-07-18 DIAGNOSIS — M25561 Pain in right knee: Secondary | ICD-10-CM | POA: Diagnosis not present

## 2015-07-18 DIAGNOSIS — D649 Anemia, unspecified: Secondary | ICD-10-CM | POA: Diagnosis not present

## 2015-07-18 DIAGNOSIS — M549 Dorsalgia, unspecified: Secondary | ICD-10-CM | POA: Diagnosis not present

## 2015-07-18 DIAGNOSIS — D509 Iron deficiency anemia, unspecified: Secondary | ICD-10-CM

## 2015-07-18 DIAGNOSIS — M150 Primary generalized (osteo)arthritis: Secondary | ICD-10-CM | POA: Diagnosis not present

## 2015-07-18 DIAGNOSIS — M069 Rheumatoid arthritis, unspecified: Secondary | ICD-10-CM | POA: Diagnosis not present

## 2015-07-20 DIAGNOSIS — I5032 Chronic diastolic (congestive) heart failure: Secondary | ICD-10-CM | POA: Diagnosis not present

## 2015-07-20 DIAGNOSIS — R0602 Shortness of breath: Secondary | ICD-10-CM | POA: Diagnosis not present

## 2015-07-20 DIAGNOSIS — R600 Localized edema: Secondary | ICD-10-CM | POA: Diagnosis not present

## 2015-07-24 ENCOUNTER — Other Ambulatory Visit (HOSPITAL_COMMUNITY): Payer: Self-pay | Admitting: Internal Medicine

## 2015-07-24 ENCOUNTER — Other Ambulatory Visit (HOSPITAL_COMMUNITY): Payer: Self-pay | Admitting: *Deleted

## 2015-07-24 DIAGNOSIS — Z6841 Body Mass Index (BMI) 40.0 and over, adult: Secondary | ICD-10-CM | POA: Diagnosis not present

## 2015-07-24 DIAGNOSIS — I5032 Chronic diastolic (congestive) heart failure: Secondary | ICD-10-CM

## 2015-07-24 DIAGNOSIS — M4806 Spinal stenosis, lumbar region: Secondary | ICD-10-CM | POA: Diagnosis not present

## 2015-07-24 DIAGNOSIS — M47816 Spondylosis without myelopathy or radiculopathy, lumbar region: Secondary | ICD-10-CM | POA: Diagnosis not present

## 2015-07-25 ENCOUNTER — Ambulatory Visit (HOSPITAL_COMMUNITY): Payer: Medicare Other

## 2015-07-25 DIAGNOSIS — Z6841 Body Mass Index (BMI) 40.0 and over, adult: Secondary | ICD-10-CM | POA: Diagnosis not present

## 2015-07-25 DIAGNOSIS — I10 Essential (primary) hypertension: Secondary | ICD-10-CM | POA: Diagnosis not present

## 2015-07-25 DIAGNOSIS — M47816 Spondylosis without myelopathy or radiculopathy, lumbar region: Secondary | ICD-10-CM | POA: Diagnosis not present

## 2015-07-26 DIAGNOSIS — R0602 Shortness of breath: Secondary | ICD-10-CM | POA: Diagnosis not present

## 2015-07-26 DIAGNOSIS — E10319 Type 1 diabetes mellitus with unspecified diabetic retinopathy without macular edema: Secondary | ICD-10-CM | POA: Diagnosis not present

## 2015-07-26 DIAGNOSIS — E7800 Pure hypercholesterolemia, unspecified: Secondary | ICD-10-CM | POA: Diagnosis not present

## 2015-07-26 DIAGNOSIS — G609 Hereditary and idiopathic neuropathy, unspecified: Secondary | ICD-10-CM | POA: Diagnosis not present

## 2015-07-26 DIAGNOSIS — R809 Proteinuria, unspecified: Secondary | ICD-10-CM | POA: Diagnosis not present

## 2015-07-28 DIAGNOSIS — R600 Localized edema: Secondary | ICD-10-CM | POA: Diagnosis not present

## 2015-07-28 DIAGNOSIS — R0602 Shortness of breath: Secondary | ICD-10-CM | POA: Diagnosis not present

## 2015-07-28 DIAGNOSIS — I5032 Chronic diastolic (congestive) heart failure: Secondary | ICD-10-CM | POA: Diagnosis not present

## 2015-08-01 DIAGNOSIS — G43719 Chronic migraine without aura, intractable, without status migrainosus: Secondary | ICD-10-CM | POA: Diagnosis not present

## 2015-08-01 DIAGNOSIS — G43839 Menstrual migraine, intractable, without status migrainosus: Secondary | ICD-10-CM | POA: Diagnosis not present

## 2015-08-02 ENCOUNTER — Other Ambulatory Visit: Payer: Self-pay | Admitting: Oncology

## 2015-08-02 ENCOUNTER — Ambulatory Visit (HOSPITAL_COMMUNITY): Payer: Medicare Other | Attending: Cardiology

## 2015-08-02 ENCOUNTER — Other Ambulatory Visit: Payer: Self-pay

## 2015-08-02 DIAGNOSIS — I5032 Chronic diastolic (congestive) heart failure: Secondary | ICD-10-CM | POA: Diagnosis not present

## 2015-08-02 DIAGNOSIS — E119 Type 2 diabetes mellitus without complications: Secondary | ICD-10-CM | POA: Diagnosis not present

## 2015-08-02 DIAGNOSIS — Z87891 Personal history of nicotine dependence: Secondary | ICD-10-CM | POA: Insufficient documentation

## 2015-08-02 DIAGNOSIS — J45909 Unspecified asthma, uncomplicated: Secondary | ICD-10-CM | POA: Insufficient documentation

## 2015-08-02 DIAGNOSIS — I110 Hypertensive heart disease with heart failure: Secondary | ICD-10-CM | POA: Diagnosis not present

## 2015-08-02 DIAGNOSIS — E785 Hyperlipidemia, unspecified: Secondary | ICD-10-CM | POA: Diagnosis not present

## 2015-08-02 DIAGNOSIS — G4733 Obstructive sleep apnea (adult) (pediatric): Secondary | ICD-10-CM | POA: Diagnosis not present

## 2015-08-03 ENCOUNTER — Ambulatory Visit: Payer: Commercial Managed Care - HMO

## 2015-08-03 ENCOUNTER — Other Ambulatory Visit: Payer: Commercial Managed Care - HMO

## 2015-08-03 ENCOUNTER — Telehealth: Payer: Self-pay | Admitting: *Deleted

## 2015-08-03 NOTE — Telephone Encounter (Signed)
Called Jessica Howard a home about missed appointments.  She had forgotten and is at home sick with a cold.  Reschedule appointments to next week.  Patient is aware of new date and time.

## 2015-08-08 ENCOUNTER — Ambulatory Visit (HOSPITAL_BASED_OUTPATIENT_CLINIC_OR_DEPARTMENT_OTHER): Payer: Medicare Other

## 2015-08-08 ENCOUNTER — Other Ambulatory Visit (HOSPITAL_BASED_OUTPATIENT_CLINIC_OR_DEPARTMENT_OTHER): Payer: Medicare Other

## 2015-08-08 VITALS — BP 151/78 | HR 93 | Temp 98.3°F

## 2015-08-08 DIAGNOSIS — D569 Thalassemia, unspecified: Secondary | ICD-10-CM

## 2015-08-08 DIAGNOSIS — N289 Disorder of kidney and ureter, unspecified: Secondary | ICD-10-CM

## 2015-08-08 DIAGNOSIS — M069 Rheumatoid arthritis, unspecified: Secondary | ICD-10-CM

## 2015-08-08 DIAGNOSIS — D509 Iron deficiency anemia, unspecified: Secondary | ICD-10-CM

## 2015-08-08 DIAGNOSIS — D563 Thalassemia minor: Secondary | ICD-10-CM

## 2015-08-08 DIAGNOSIS — D638 Anemia in other chronic diseases classified elsewhere: Secondary | ICD-10-CM

## 2015-08-08 LAB — COMPREHENSIVE METABOLIC PANEL
ALT: 25 U/L (ref 0–55)
ANION GAP: 8 meq/L (ref 3–11)
AST: 21 U/L (ref 5–34)
Albumin: 3.5 g/dL (ref 3.5–5.0)
Alkaline Phosphatase: 83 U/L (ref 40–150)
BUN: 14.4 mg/dL (ref 7.0–26.0)
CHLORIDE: 105 meq/L (ref 98–109)
CO2: 22 meq/L (ref 22–29)
CREATININE: 1.1 mg/dL (ref 0.6–1.1)
Calcium: 9 mg/dL (ref 8.4–10.4)
EGFR: 69 mL/min/{1.73_m2} — ABNORMAL LOW (ref >90)
Glucose: 391 mg/dl — ABNORMAL HIGH (ref 70–140)
Potassium: 4.2 mEq/L (ref 3.5–5.1)
SODIUM: 135 meq/L — AB (ref 136–145)
Total Bilirubin: 0.45 mg/dL (ref 0.20–1.20)
Total Protein: 7.1 g/dL (ref 6.4–8.3)

## 2015-08-08 LAB — CBC WITH DIFFERENTIAL/PLATELET
BASO%: 0.2 % (ref 0.0–2.0)
Basophils Absolute: 0 10*3/uL (ref 0.0–0.1)
EOS ABS: 0.2 10*3/uL (ref 0.0–0.5)
EOS%: 2.2 % (ref 0.0–7.0)
HEMATOCRIT: 35.2 % (ref 34.8–46.6)
HEMOGLOBIN: 10.5 g/dL — AB (ref 11.6–15.9)
LYMPH%: 36.5 % (ref 14.0–49.7)
MCH: 20.4 pg — ABNORMAL LOW (ref 25.1–34.0)
MCHC: 30 g/dL — ABNORMAL LOW (ref 31.5–36.0)
MCV: 68.1 fL — AB (ref 79.5–101.0)
MONO#: 0.9 10*3/uL (ref 0.1–0.9)
MONO%: 13.4 % (ref 0.0–14.0)
NEUT%: 47.7 % (ref 38.4–76.8)
NEUTROS ABS: 3.3 10*3/uL (ref 1.5–6.5)
PLATELETS: 249 10*3/uL (ref 145–400)
RBC: 5.16 10*6/uL (ref 3.70–5.45)
RDW: 17.3 % — AB (ref 11.2–14.5)
WBC: 6.9 10*3/uL (ref 3.9–10.3)
lymph#: 2.5 10*3/uL (ref 0.9–3.3)

## 2015-08-08 LAB — IRON AND TIBC
%SAT: 45 % (ref 21–57)
Iron: 109 ug/dL (ref 41–142)
TIBC: 244 ug/dL (ref 236–444)
UIBC: 135 ug/dL (ref 120–384)

## 2015-08-08 LAB — FERRITIN: FERRITIN: 330 ng/mL — AB (ref 9–269)

## 2015-08-08 MED ORDER — DARBEPOETIN ALFA 300 MCG/0.6ML IJ SOSY
300.0000 ug | PREFILLED_SYRINGE | Freq: Once | INTRAMUSCULAR | Status: AC
  Administered 2015-08-08: 300 ug via SUBCUTANEOUS
  Filled 2015-08-08: qty 0.6

## 2015-08-09 DIAGNOSIS — Z6841 Body Mass Index (BMI) 40.0 and over, adult: Secondary | ICD-10-CM | POA: Diagnosis not present

## 2015-08-09 DIAGNOSIS — M47816 Spondylosis without myelopathy or radiculopathy, lumbar region: Secondary | ICD-10-CM | POA: Diagnosis not present

## 2015-08-16 ENCOUNTER — Ambulatory Visit (HOSPITAL_COMMUNITY)
Admission: RE | Admit: 2015-08-16 | Discharge: 2015-08-16 | Disposition: A | Discharge status: Home / Self Care | Payer: Medicare Other | Source: Ambulatory Visit | Attending: Rheumatology | Admitting: Rheumatology

## 2015-08-16 DIAGNOSIS — M069 Rheumatoid arthritis, unspecified: Secondary | ICD-10-CM | POA: Insufficient documentation

## 2015-08-16 DIAGNOSIS — Z79899 Other long term (current) drug therapy: Secondary | ICD-10-CM | POA: Diagnosis not present

## 2015-08-16 MED ORDER — SODIUM CHLORIDE 0.9 % IV SOLN
1000.0000 mg | INTRAVENOUS | Status: DC
  Administered 2015-08-16: 1000 mg via INTRAVENOUS
  Filled 2015-08-16: qty 40

## 2015-08-16 MED ORDER — SODIUM CHLORIDE 0.9 % IV SOLN
INTRAVENOUS | Status: DC
  Administered 2015-08-16: 10:00:00 via INTRAVENOUS

## 2015-08-25 DIAGNOSIS — I5032 Chronic diastolic (congestive) heart failure: Secondary | ICD-10-CM | POA: Diagnosis not present

## 2015-08-25 DIAGNOSIS — E7800 Pure hypercholesterolemia, unspecified: Secondary | ICD-10-CM | POA: Diagnosis not present

## 2015-08-25 DIAGNOSIS — I10 Essential (primary) hypertension: Secondary | ICD-10-CM | POA: Diagnosis not present

## 2015-08-28 DIAGNOSIS — J310 Chronic rhinitis: Secondary | ICD-10-CM | POA: Diagnosis not present

## 2015-08-28 DIAGNOSIS — E7800 Pure hypercholesterolemia, unspecified: Secondary | ICD-10-CM | POA: Diagnosis not present

## 2015-08-28 DIAGNOSIS — E10319 Type 1 diabetes mellitus with unspecified diabetic retinopathy without macular edema: Secondary | ICD-10-CM | POA: Diagnosis not present

## 2015-08-28 DIAGNOSIS — M069 Rheumatoid arthritis, unspecified: Secondary | ICD-10-CM | POA: Diagnosis not present

## 2015-08-31 ENCOUNTER — Ambulatory Visit: Payer: Medicare Other

## 2015-08-31 ENCOUNTER — Other Ambulatory Visit (HOSPITAL_BASED_OUTPATIENT_CLINIC_OR_DEPARTMENT_OTHER): Payer: Medicare Other

## 2015-08-31 ENCOUNTER — Telehealth: Payer: Self-pay | Admitting: Oncology

## 2015-08-31 ENCOUNTER — Ambulatory Visit (HOSPITAL_BASED_OUTPATIENT_CLINIC_OR_DEPARTMENT_OTHER): Payer: Medicare Other | Admitting: Oncology

## 2015-08-31 VITALS — BP 171/82 | HR 95 | Temp 98.2°F | Resp 18 | Ht 63.0 in | Wt 304.0 lb

## 2015-08-31 DIAGNOSIS — D509 Iron deficiency anemia, unspecified: Secondary | ICD-10-CM

## 2015-08-31 DIAGNOSIS — M069 Rheumatoid arthritis, unspecified: Secondary | ICD-10-CM

## 2015-08-31 DIAGNOSIS — D638 Anemia in other chronic diseases classified elsewhere: Secondary | ICD-10-CM | POA: Diagnosis not present

## 2015-08-31 DIAGNOSIS — N289 Disorder of kidney and ureter, unspecified: Secondary | ICD-10-CM | POA: Diagnosis not present

## 2015-08-31 DIAGNOSIS — D561 Beta thalassemia: Secondary | ICD-10-CM | POA: Diagnosis not present

## 2015-08-31 DIAGNOSIS — D569 Thalassemia, unspecified: Secondary | ICD-10-CM

## 2015-08-31 LAB — CBC WITH DIFFERENTIAL/PLATELET
BASO%: 0.7 % (ref 0.0–2.0)
Basophils Absolute: 0 10*3/uL (ref 0.0–0.1)
EOS%: 2.8 % (ref 0.0–7.0)
Eosinophils Absolute: 0.2 10*3/uL (ref 0.0–0.5)
HEMATOCRIT: 36.3 % (ref 34.8–46.6)
HGB: 11.3 g/dL — ABNORMAL LOW (ref 11.6–15.9)
LYMPH%: 49.2 % (ref 14.0–49.7)
MCH: 21.1 pg — AB (ref 25.1–34.0)
MCHC: 31.1 g/dL — AB (ref 31.5–36.0)
MCV: 67.9 fL — ABNORMAL LOW (ref 79.5–101.0)
MONO#: 0.5 10*3/uL (ref 0.1–0.9)
MONO%: 8.7 % (ref 0.0–14.0)
NEUT#: 2.2 10*3/uL (ref 1.5–6.5)
NEUT%: 38.6 % (ref 38.4–76.8)
PLATELETS: 325 10*3/uL (ref 145–400)
RBC: 5.35 10*6/uL (ref 3.70–5.45)
RDW: 17.5 % — ABNORMAL HIGH (ref 11.2–14.5)
WBC: 5.8 10*3/uL (ref 3.9–10.3)
lymph#: 2.8 10*3/uL (ref 0.9–3.3)
nRBC: 0 % (ref 0–0)

## 2015-08-31 NOTE — Progress Notes (Signed)
Hematology and Oncology Follow Up Visit  Jessica Howard [id number] 03-31-1961 55 y.o. 08/31/2015 9:00 AM   Principle Diagnosis: This is a 55 year old female with anemia of renal disease. She has also microcytic anemia due to beta thalassemia. She also has element of anemia of chronic disease with history of rheumatoid arthritis.  Current therapy: Aranesp 300 g every 4 weeks to keep her hemoglobin above 10.  Interim History:  Jessica Howard presents today for a follow-up visit. Since her last visit, she reports no changes in her health. She continues to have chronic pain issues and have received injections for possible spinal stenosis and osteoarthritis. Her pain has written reasonably controlled and follows with chronic pain clinic. She does report chronic joint pain related to her rheumatoid arthritis which seems to be reasonably controlled with Orencia.  She is tolerating the Aranesp injections without difficulty. She denied any thrombosis or bleeding episodes. Her blood pressure have been reasonably controlled.  She reports her energy and performance status is improved on these injections. She denied any hematochezia, melena, hemoptysis or hematemesis.  She did not report headaches or blurry vision or syncope. She did not report any chest pain or palpitation. She is not reporting nausea, vomiting, abdominal pain. She does not report any hematochezia or bleeding per rectum. Does not report any frequency urgency or hesitancy. She does not report any hematuria hematochezia or melena. Remainder of her review of systems unremarkable.   Medications: I have reviewed the patient's current medications.  Current Outpatient Prescriptions  Medication Sig Dispense Refill  . abatacept (ORENCIA) 250 MG injection Inject 1,000 mg into the vein every 28 (twenty-eight) days. Monthly Infusion    . acetaminophen-codeine (TYLENOL #3) 300-30 MG tablet Take 1 tablet by mouth 3 (three) times daily.    Marland Kitchen  amitriptyline (ELAVIL) 25 MG tablet Take 25 mg by mouth at bedtime.     . BD INSULIN SYRINGE ULTRAFINE 31G X 15/64" 1 ML MISC     . BYSTOLIC 10 MG tablet   8  . chlorthalidone (HYGROTON) 25 MG tablet Take 25 mg by mouth daily.    . CRESTOR 20 MG tablet 20 mg daily.     . cyclobenzaprine (FLEXERIL) 5 MG tablet Take 5 mg by mouth 2 (two) times daily. Take half a pill BID    . diltiazem (CARDIZEM CD) 240 MG 24 hr capsule Take 240 mg by mouth daily.     Marland Kitchen docusate sodium 100 MG CAPS 1 tab 2 times a day while on narcotics.  STOOL SOFTENER 60 capsule 0  . fluticasone (FLONASE) 50 MCG/ACT nasal spray Place 1 spray into both nostrils daily as needed for allergies or rhinitis.    Marland Kitchen gabapentin (NEURONTIN) 300 MG capsule Take 600 mg by mouth 3 (three) times daily.     Marland Kitchen HYDROcodone-acetaminophen (NORCO) 10-325 MG per tablet Take 1 tablet by mouth every 4 (four) hours as needed for moderate pain. 60 tablet 0  . insulin lispro (HUMALOG) 100 UNIT/ML injection Inject 40 Units into the skin 3 (three) times daily before meals. Per sliding scale    . insulin NPH (HUMULIN N,NOVOLIN N) 100 UNIT/ML injection Inject 75 Units into the skin 3 (three) times daily with meals. 75 units with breakfast; lunch 75 units; 75 units at dinner    . irbesartan (AVAPRO) 300 MG tablet Take 300 mg by mouth daily.     . methocarbamol (ROBAXIN) 500 MG tablet Take 500 mg by mouth 2 (two) times daily.    Marland Kitchen  Methotrexate Sodium (METHOTREXATE, PF,) 50 MG/2ML injection Take as directed    . Nebivolol HCl (BYSTOLIC) 20 MG TABS Take 20 mg by mouth daily.    . ondansetron (ZOFRAN-ODT) 4 MG disintegrating tablet   0  . ONETOUCH VERIO test strip   5  . pantoprazole (PROTONIX) 40 MG tablet Take 40 mg by mouth daily.    . polyethylene glycol (MIRALAX / GLYCOLAX) packet Take 17 g by mouth 2 (two) times daily. 60 each 0  . predniSONE (DELTASONE) 5 MG tablet Take 5 mg by mouth daily.    Marland Kitchen tiZANidine (ZANAFLEX) 4 MG tablet Take 4 mg by mouth 2 (two)  times daily as needed (migraines). Limit 2 days per week    . topiramate (TOPAMAX) 100 MG tablet Take 100 mg by mouth daily.     . VOLTAREN 1 % GEL Apply 1 application topically 3 (three) times daily.     . furosemide (LASIX) 20 MG tablet Take 0.5 tablets (10 mg total) by mouth daily. 15 tablet 0   No current facility-administered medications for this visit.    Allergies:  Allergies  Allergen Reactions  . Bactrim [Sulfamethoxazole-Trimethoprim] Hives  . Cefuroxime Axetil Itching  . Cephalosporins Itching  . Lisinopril Cough  . Penicillins Hives  . Sulfa Antibiotics Hives  . Iohexol Itching and Rash     Code: RASH, Desc: HAD ITCHING AND A RASH ABOUT ONE HOUR AFTER RETURNING HOME FROM THE CT, Onset Date: [id number]     Past Medical History, Surgical history, Social history, and Family History were reviewed and updated.   Blood pressure 171/82, pulse 95, temperature 98.2 F (36.8 C), temperature source Oral, resp. rate 18, height 5\' 3"  (1.6 m), weight 304 lb (137.893 kg), last menstrual period 12/27/2013, SpO2 99 %. ECOG: 1 General appearance: alert awake woman without distress. Head: Normocephalic, without obvious abnormality. No oral ulcers or lesions. Neck: no adenopathy Lymph nodes: Cervical, supraclavicular, and axillary nodes normal. Heart:regular rate and rhythm, S1, S2 normal, no murmur, click, rub or gallop Lung:chest clear, no wheezing, rales, normal symmetric air entry Abdomin: soft, non-tender, without masses or organomegaly. No rebound or guarding. EXT:no erythema, induration, or nodules. Mild edema noted in her lower extremities.   CBC    Component Value Date/Time   WBC 5.8 08/31/2015 0839   WBC 10.3 05/21/2014 0329   RBC 5.35 08/31/2015 0839   RBC 4.10 05/21/2014 0329   HGB 11.3* 08/31/2015 0839   HGB 8.6* 05/21/2014 0329   HCT 36.3 08/31/2015 0839   HCT 28.2* 05/21/2014 0329   PLT 325 08/31/2015 0839   PLT 278 05/21/2014 0329   MCV 67.9* 08/31/2015 0839    MCV 68.8* 05/21/2014 0329   MCH 21.1* 08/31/2015 0839   MCH 21.0* 05/21/2014 0329   MCHC 31.1* 08/31/2015 0839   MCHC 30.5 05/21/2014 0329   RDW 17.5* 08/31/2015 0839   RDW 17.5* 05/21/2014 0329   LYMPHSABS 2.8 08/31/2015 0839   LYMPHSABS 2.5 08/26/2009 2310   MONOABS 0.5 08/31/2015 0839   MONOABS 0.7 08/26/2009 2310   EOSABS 0.2 08/31/2015 0839   EOSABS 0.1 08/26/2009 2310   BASOSABS 0.0 08/31/2015 0839   BASOSABS 0.0 08/26/2009 2310       Impression and Plan:  A 55 year old female with the following issues:   1. Anemia of renal insufficiency: Her creatinine clearance is less than 70 with response to Aranesp at this time. She receives Aranesp to keep her hemoglobin above 10. She has tolerated this infusion well without  any long-term complications. The plan is to continue with the same dose and schedule every 4 weeks. She will not receive injection today given the fact her hemoglobin is above 11.  2. 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 Her iron studies have been reviewed in February 2017 and appears adequate. No iron replacement is needed at this time.  3. Rheumatoid arthritis: Likely contributing to chronic anemia related to chronic inflammatory process. She is currently on Orencia which have helped her symptoms. In  4. Follow-up: Will be in 4 months to assess her response to therapy.  [facility], MD 3/23/20179:00 AM

## 2015-08-31 NOTE — Telephone Encounter (Signed)
per pof to sch pt appt-gave pt copy of avs °

## 2015-09-07 DIAGNOSIS — Z6841 Body Mass Index (BMI) 40.0 and over, adult: Secondary | ICD-10-CM | POA: Diagnosis not present

## 2015-09-07 DIAGNOSIS — M47816 Spondylosis without myelopathy or radiculopathy, lumbar region: Secondary | ICD-10-CM | POA: Diagnosis not present

## 2015-09-07 DIAGNOSIS — I10 Essential (primary) hypertension: Secondary | ICD-10-CM | POA: Diagnosis not present

## 2015-09-12 ENCOUNTER — Other Ambulatory Visit (HOSPITAL_COMMUNITY): Payer: Self-pay | Admitting: *Deleted

## 2015-09-13 ENCOUNTER — Encounter (HOSPITAL_COMMUNITY): Payer: Medicare Other

## 2015-09-13 ENCOUNTER — Encounter (HOSPITAL_COMMUNITY)
Admission: RE | Admit: 2015-09-13 | Discharge: 2015-09-13 | Disposition: A | Discharge status: Home / Self Care | Payer: Medicare Other | Source: Ambulatory Visit | Attending: Rheumatology | Admitting: Rheumatology

## 2015-09-13 DIAGNOSIS — M069 Rheumatoid arthritis, unspecified: Secondary | ICD-10-CM | POA: Insufficient documentation

## 2015-09-13 MED ORDER — SODIUM CHLORIDE 0.9 % IV SOLN
1000.0000 mg | INTRAVENOUS | Status: AC
  Administered 2015-09-13: 1000 mg via INTRAVENOUS
  Filled 2015-09-13: qty 40

## 2015-09-13 MED ORDER — SODIUM CHLORIDE 0.9 % IV SOLN
INTRAVENOUS | Status: DC
  Administered 2015-09-13: 10:00:00 via INTRAVENOUS

## 2015-09-20 ENCOUNTER — Ambulatory Visit: Payer: Medicare Other

## 2015-09-20 ENCOUNTER — Other Ambulatory Visit (HOSPITAL_BASED_OUTPATIENT_CLINIC_OR_DEPARTMENT_OTHER): Payer: Medicare Other

## 2015-09-20 DIAGNOSIS — M069 Rheumatoid arthritis, unspecified: Secondary | ICD-10-CM | POA: Diagnosis present

## 2015-09-20 DIAGNOSIS — N289 Disorder of kidney and ureter, unspecified: Secondary | ICD-10-CM

## 2015-09-20 DIAGNOSIS — D563 Thalassemia minor: Secondary | ICD-10-CM

## 2015-09-20 DIAGNOSIS — D509 Iron deficiency anemia, unspecified: Secondary | ICD-10-CM

## 2015-09-20 DIAGNOSIS — D638 Anemia in other chronic diseases classified elsewhere: Secondary | ICD-10-CM

## 2015-09-20 DIAGNOSIS — D569 Thalassemia, unspecified: Secondary | ICD-10-CM

## 2015-09-20 LAB — CBC WITH DIFFERENTIAL/PLATELET
BASO%: 0.3 % (ref 0.0–2.0)
Basophils Absolute: 0 10*3/uL (ref 0.0–0.1)
EOS%: 1.5 % (ref 0.0–7.0)
Eosinophils Absolute: 0.1 10*3/uL (ref 0.0–0.5)
HCT: 33.8 % — ABNORMAL LOW (ref 34.8–46.6)
HGB: 10.6 g/dL — ABNORMAL LOW (ref 11.6–15.9)
LYMPH#: 4 10*3/uL — AB (ref 0.9–3.3)
LYMPH%: 46.9 % (ref 14.0–49.7)
MCH: 20.8 pg — AB (ref 25.1–34.0)
MCHC: 31.4 g/dL — ABNORMAL LOW (ref 31.5–36.0)
MCV: 66.3 fL — AB (ref 79.5–101.0)
MONO#: 0.8 10*3/uL (ref 0.1–0.9)
MONO%: 9.2 % (ref 0.0–14.0)
NEUT#: 3.6 10*3/uL (ref 1.5–6.5)
NEUT%: 42.1 % (ref 38.4–76.8)
NRBC: 0 % (ref 0–0)
PLATELETS: 264 10*3/uL (ref 145–400)
RBC: 5.1 10*6/uL (ref 3.70–5.45)
RDW: 17.4 % — AB (ref 11.2–14.5)
WBC: 8.6 10*3/uL (ref 3.9–10.3)

## 2015-09-20 MED ORDER — DARBEPOETIN ALFA 300 MCG/0.6ML IJ SOSY
300.0000 ug | PREFILLED_SYRINGE | Freq: Once | INTRAMUSCULAR | Status: DC
  Filled 2015-09-20: qty 0.6

## 2015-09-26 DIAGNOSIS — I10 Essential (primary) hypertension: Secondary | ICD-10-CM | POA: Diagnosis not present

## 2015-09-26 DIAGNOSIS — M47816 Spondylosis without myelopathy or radiculopathy, lumbar region: Secondary | ICD-10-CM | POA: Diagnosis not present

## 2015-09-26 DIAGNOSIS — Z6841 Body Mass Index (BMI) 40.0 and over, adult: Secondary | ICD-10-CM | POA: Diagnosis not present

## 2015-10-02 ENCOUNTER — Ambulatory Visit (INDEPENDENT_AMBULATORY_CARE_PROVIDER_SITE_OTHER): Payer: Medicare Other | Admitting: Ophthalmology

## 2015-10-02 DIAGNOSIS — H2513 Age-related nuclear cataract, bilateral: Secondary | ICD-10-CM | POA: Diagnosis not present

## 2015-10-02 DIAGNOSIS — E113392 Type 2 diabetes mellitus with moderate nonproliferative diabetic retinopathy without macular edema, left eye: Secondary | ICD-10-CM

## 2015-10-02 DIAGNOSIS — E11311 Type 2 diabetes mellitus with unspecified diabetic retinopathy with macular edema: Secondary | ICD-10-CM | POA: Diagnosis not present

## 2015-10-02 DIAGNOSIS — H43813 Vitreous degeneration, bilateral: Secondary | ICD-10-CM | POA: Diagnosis not present

## 2015-10-02 DIAGNOSIS — E113311 Type 2 diabetes mellitus with moderate nonproliferative diabetic retinopathy with macular edema, right eye: Secondary | ICD-10-CM

## 2015-10-02 DIAGNOSIS — H35033 Hypertensive retinopathy, bilateral: Secondary | ICD-10-CM

## 2015-10-02 DIAGNOSIS — I10 Essential (primary) hypertension: Secondary | ICD-10-CM

## 2015-10-11 ENCOUNTER — Encounter (HOSPITAL_COMMUNITY): Payer: Medicare Other

## 2015-10-11 ENCOUNTER — Ambulatory Visit: Payer: Medicare Other

## 2015-10-11 ENCOUNTER — Other Ambulatory Visit: Payer: Medicare Other

## 2015-10-12 ENCOUNTER — Other Ambulatory Visit (HOSPITAL_COMMUNITY): Payer: Self-pay | Admitting: *Deleted

## 2015-10-13 ENCOUNTER — Ambulatory Visit (HOSPITAL_COMMUNITY): Admission: RE | Admit: 2015-10-13 | Payer: Medicare Other | Source: Ambulatory Visit

## 2015-10-17 DIAGNOSIS — D649 Anemia, unspecified: Secondary | ICD-10-CM | POA: Diagnosis not present

## 2015-10-17 DIAGNOSIS — M549 Dorsalgia, unspecified: Secondary | ICD-10-CM | POA: Diagnosis not present

## 2015-10-17 DIAGNOSIS — M150 Primary generalized (osteo)arthritis: Secondary | ICD-10-CM | POA: Diagnosis not present

## 2015-10-17 DIAGNOSIS — M069 Rheumatoid arthritis, unspecified: Secondary | ICD-10-CM | POA: Diagnosis not present

## 2015-10-23 DIAGNOSIS — M4806 Spinal stenosis, lumbar region: Secondary | ICD-10-CM | POA: Diagnosis not present

## 2015-10-23 DIAGNOSIS — Z6841 Body Mass Index (BMI) 40.0 and over, adult: Secondary | ICD-10-CM | POA: Diagnosis not present

## 2015-10-23 DIAGNOSIS — I10 Essential (primary) hypertension: Secondary | ICD-10-CM | POA: Diagnosis not present

## 2015-10-23 DIAGNOSIS — M47816 Spondylosis without myelopathy or radiculopathy, lumbar region: Secondary | ICD-10-CM | POA: Diagnosis not present

## 2015-11-01 ENCOUNTER — Ambulatory Visit: Payer: Medicare Other

## 2015-11-01 ENCOUNTER — Other Ambulatory Visit (HOSPITAL_BASED_OUTPATIENT_CLINIC_OR_DEPARTMENT_OTHER): Payer: Medicare Other

## 2015-11-01 DIAGNOSIS — M069 Rheumatoid arthritis, unspecified: Secondary | ICD-10-CM | POA: Diagnosis present

## 2015-11-01 DIAGNOSIS — D509 Iron deficiency anemia, unspecified: Secondary | ICD-10-CM

## 2015-11-01 DIAGNOSIS — D569 Thalassemia, unspecified: Secondary | ICD-10-CM

## 2015-11-01 DIAGNOSIS — D638 Anemia in other chronic diseases classified elsewhere: Secondary | ICD-10-CM | POA: Diagnosis not present

## 2015-11-01 LAB — CBC WITH DIFFERENTIAL/PLATELET
BASO%: 0.3 % (ref 0.0–2.0)
Basophils Absolute: 0 10*3/uL (ref 0.0–0.1)
EOS%: 1.6 % (ref 0.0–7.0)
Eosinophils Absolute: 0.1 10*3/uL (ref 0.0–0.5)
HEMATOCRIT: 34.8 % (ref 34.8–46.6)
HGB: 11.4 g/dL — ABNORMAL LOW (ref 11.6–15.9)
LYMPH%: 44.1 % (ref 14.0–49.7)
MCH: 21.9 pg — AB (ref 25.1–34.0)
MCHC: 32.8 g/dL (ref 31.5–36.0)
MCV: 66.9 fL — ABNORMAL LOW (ref 79.5–101.0)
MONO#: 0.7 10*3/uL (ref 0.1–0.9)
MONO%: 9.6 % (ref 0.0–14.0)
NEUT#: 3 10*3/uL (ref 1.5–6.5)
NEUT%: 44.4 % (ref 38.4–76.8)
PLATELETS: 278 10*3/uL (ref 145–400)
RBC: 5.2 10*6/uL (ref 3.70–5.45)
RDW: 17.1 % — ABNORMAL HIGH (ref 11.2–14.5)
WBC: 6.8 10*3/uL (ref 3.9–10.3)
lymph#: 3 10*3/uL (ref 0.9–3.3)
nRBC: 0 % (ref 0–0)

## 2015-11-01 NOTE — Progress Notes (Signed)
Pt's Hgb at 11.4, injection not needed per orders. Pt given results, copy of current schedule, and instructed to call office if issues occur. Pt verbalized understanding.

## 2015-11-07 ENCOUNTER — Other Ambulatory Visit: Payer: Self-pay | Admitting: Neurosurgery

## 2015-11-07 DIAGNOSIS — M5137 Other intervertebral disc degeneration, lumbosacral region: Secondary | ICD-10-CM | POA: Diagnosis not present

## 2015-11-07 DIAGNOSIS — Z6841 Body Mass Index (BMI) 40.0 and over, adult: Secondary | ICD-10-CM | POA: Diagnosis not present

## 2015-11-07 DIAGNOSIS — I10 Essential (primary) hypertension: Secondary | ICD-10-CM | POA: Diagnosis not present

## 2015-11-08 ENCOUNTER — Ambulatory Visit (HOSPITAL_COMMUNITY)
Admission: RE | Admit: 2015-11-08 | Discharge: 2015-11-08 | Disposition: A | Discharge status: Home / Self Care | Payer: Medicare Other | Source: Ambulatory Visit | Attending: Rheumatology | Admitting: Rheumatology

## 2015-11-08 DIAGNOSIS — M069 Rheumatoid arthritis, unspecified: Secondary | ICD-10-CM | POA: Insufficient documentation

## 2015-11-08 MED ORDER — SODIUM CHLORIDE 0.9 % IV SOLN
INTRAVENOUS | Status: DC
  Administered 2015-11-08: 09:00:00 via INTRAVENOUS

## 2015-11-08 MED ORDER — SODIUM CHLORIDE 0.9 % IV SOLN
1000.0000 mg | INTRAVENOUS | Status: DC
  Administered 2015-11-08: 1000 mg via INTRAVENOUS
  Filled 2015-11-08: qty 40

## 2015-11-13 ENCOUNTER — Ambulatory Visit
Admission: RE | Admit: 2015-11-13 | Discharge: 2015-11-13 | Disposition: A | Discharge status: Home / Self Care | Payer: Medicare Other | Source: Ambulatory Visit | Attending: Neurosurgery | Admitting: Neurosurgery

## 2015-11-13 DIAGNOSIS — M5126 Other intervertebral disc displacement, lumbar region: Secondary | ICD-10-CM | POA: Diagnosis not present

## 2015-11-13 DIAGNOSIS — M5137 Other intervertebral disc degeneration, lumbosacral region: Secondary | ICD-10-CM

## 2015-11-17 DIAGNOSIS — M069 Rheumatoid arthritis, unspecified: Secondary | ICD-10-CM | POA: Diagnosis not present

## 2015-11-17 DIAGNOSIS — D649 Anemia, unspecified: Secondary | ICD-10-CM | POA: Diagnosis not present

## 2015-11-17 DIAGNOSIS — M549 Dorsalgia, unspecified: Secondary | ICD-10-CM | POA: Diagnosis not present

## 2015-11-17 DIAGNOSIS — M150 Primary generalized (osteo)arthritis: Secondary | ICD-10-CM | POA: Diagnosis not present

## 2015-11-21 DIAGNOSIS — M791 Myalgia: Secondary | ICD-10-CM | POA: Diagnosis not present

## 2015-11-21 DIAGNOSIS — D1779 Benign lipomatous neoplasm of other sites: Secondary | ICD-10-CM | POA: Diagnosis not present

## 2015-11-21 DIAGNOSIS — G43839 Menstrual migraine, intractable, without status migrainosus: Secondary | ICD-10-CM | POA: Diagnosis not present

## 2015-11-21 DIAGNOSIS — I10 Essential (primary) hypertension: Secondary | ICD-10-CM | POA: Diagnosis not present

## 2015-11-21 DIAGNOSIS — G518 Other disorders of facial nerve: Secondary | ICD-10-CM | POA: Diagnosis not present

## 2015-11-21 DIAGNOSIS — G43719 Chronic migraine without aura, intractable, without status migrainosus: Secondary | ICD-10-CM | POA: Diagnosis not present

## 2015-11-21 DIAGNOSIS — Z6841 Body Mass Index (BMI) 40.0 and over, adult: Secondary | ICD-10-CM | POA: Diagnosis not present

## 2015-11-21 DIAGNOSIS — M5137 Other intervertebral disc degeneration, lumbosacral region: Secondary | ICD-10-CM | POA: Diagnosis not present

## 2015-11-21 DIAGNOSIS — M542 Cervicalgia: Secondary | ICD-10-CM | POA: Diagnosis not present

## 2015-11-21 DIAGNOSIS — R51 Headache: Secondary | ICD-10-CM | POA: Diagnosis not present

## 2015-11-22 ENCOUNTER — Other Ambulatory Visit (HOSPITAL_BASED_OUTPATIENT_CLINIC_OR_DEPARTMENT_OTHER): Payer: Medicare Other

## 2015-11-22 ENCOUNTER — Ambulatory Visit: Payer: Medicare Other

## 2015-11-22 DIAGNOSIS — D638 Anemia in other chronic diseases classified elsewhere: Secondary | ICD-10-CM | POA: Diagnosis present

## 2015-11-22 DIAGNOSIS — D509 Iron deficiency anemia, unspecified: Secondary | ICD-10-CM

## 2015-11-22 DIAGNOSIS — D569 Thalassemia, unspecified: Secondary | ICD-10-CM

## 2015-11-22 LAB — CBC WITH DIFFERENTIAL/PLATELET
BASO%: 0.5 % (ref 0.0–2.0)
Basophils Absolute: 0.1 10*3/uL (ref 0.0–0.1)
EOS%: 0.1 % (ref 0.0–7.0)
Eosinophils Absolute: 0 10*3/uL (ref 0.0–0.5)
HEMATOCRIT: 33.5 % — AB (ref 34.8–46.6)
HGB: 10.3 g/dL — ABNORMAL LOW (ref 11.6–15.9)
LYMPH#: 1.2 10*3/uL (ref 0.9–3.3)
LYMPH%: 11.6 % — ABNORMAL LOW (ref 14.0–49.7)
MCH: 20.4 pg — ABNORMAL LOW (ref 25.1–34.0)
MCHC: 30.8 g/dL — AB (ref 31.5–36.0)
MCV: 66.2 fL — ABNORMAL LOW (ref 79.5–101.0)
MONO#: 0.5 10*3/uL (ref 0.1–0.9)
MONO%: 4.5 % (ref 0.0–14.0)
NEUT%: 83.3 % — AB (ref 38.4–76.8)
NEUTROS ABS: 9 10*3/uL — AB (ref 1.5–6.5)
Platelets: 255 10*3/uL (ref 145–400)
RBC: 5.06 10*6/uL (ref 3.70–5.45)
RDW: 16.6 % — AB (ref 11.2–14.5)
WBC: 10.8 10*3/uL — AB (ref 3.9–10.3)

## 2015-11-22 LAB — IRON AND TIBC
%SAT: 38 % (ref 21–57)
Iron: 102 ug/dL (ref 41–142)
TIBC: 273 ug/dL (ref 236–444)
UIBC: 170 ug/dL (ref 120–384)

## 2015-11-22 LAB — FERRITIN: Ferritin: 342 ng/ml — ABNORMAL HIGH (ref 9–269)

## 2015-11-22 NOTE — Patient Instructions (Signed)
Darbepoetin Alfa injection What is this medicine? DARBEPOETIN ALFA (dar be POE e tin AL fa) helps your body make more red blood cells. It is used to treat anemia caused by chronic kidney failure and chemotherapy. This medicine may be used for other purposes; ask your health care provider or pharmacist if you have questions. COMMON BRAND NAME(S): Aranesp What should I tell my health care provider before I take this medicine? They need to know if you have any of these conditions: -blood clotting disorders or history of blood clots -cancer patient not on chemotherapy -cystic fibrosis -heart disease, such as angina, heart failure, or a history of a heart attack -hemoglobin level of 12 g/dL or greater -high blood pressure -low levels of folate, iron, or vitamin B12 -seizures -an unusual or allergic reaction to darbepoetin, erythropoietin, albumin, hamster proteins, latex, other medicines, foods, dyes, or preservatives -pregnant or trying to get pregnant -breast-feeding How should I use this medicine? This medicine is for injection into a vein or under the skin. It is usually given by a health care professional in a hospital or clinic setting. If you get this medicine at home, you will be taught how to prepare and give this medicine. Do not shake the solution before you withdraw a dose. Use exactly as directed. Take your medicine at regular intervals. Do not take your medicine more often than directed. It is important that you put your used needles and syringes in a special sharps container. Do not put them in a trash can. If you do not have a sharps container, call your pharmacist or healthcare provider to get one. Talk to your pediatrician regarding the use of this medicine in children. While this medicine may be used in children as young as 1 year for selected conditions, precautions do apply. Overdosage: If you think you have taken too much of this medicine contact a poison control center or  emergency room at once. NOTE: This medicine is only for you. Do not share this medicine with others. What if I miss a dose? If you miss a dose, take it as soon as you can. If it is almost time for your next dose, take only that dose. Do not take double or extra doses. What may interact with this medicine? Do not take this medicine with any of the following medications: -epoetin alfa This list may not describe all possible interactions. Give your health care provider a list of all the medicines, herbs, non-prescription drugs, or dietary supplements you use. Also tell them if you smoke, drink alcohol, or use illegal drugs. Some items may interact with your medicine. What should I watch for while using this medicine? Visit your prescriber or health care professional for regular checks on your progress and for the needed blood tests and blood pressure measurements. It is especially important for the doctor to make sure your hemoglobin level is in the desired range, to limit the risk of potential side effects and to give you the best benefit. Keep all appointments for any recommended tests. Check your blood pressure as directed. Ask your doctor what your blood pressure should be and when you should contact him or her. As your body makes more red blood cells, you may need to take iron, folic acid, or vitamin B supplements. Ask your doctor or health care provider which products are right for you. If you have kidney disease continue dietary restrictions, even though this medication can make you feel better. Talk with your doctor or health   care professional about the foods you eat and the vitamins that you take. What side effects may I notice from receiving this medicine? Side effects that you should report to your doctor or health care professional as soon as possible: -allergic reactions like skin rash, itching or hives, swelling of the face, lips, or tongue -breathing problems -changes in vision -chest  pain -confusion, trouble speaking or understanding -feeling faint or lightheaded, falls -high blood pressure -muscle aches or pains -pain, swelling, warmth in the leg -rapid weight gain -severe headaches -sudden numbness or weakness of the face, arm or leg -trouble walking, dizziness, loss of balance or coordination -seizures (convulsions) -swelling of the ankles, feet, hands -unusually weak or tired Side effects that usually do not require medical attention (report to your doctor or health care professional if they continue or are bothersome): -diarrhea -fever, chills (flu-like symptoms) -headaches -nausea, vomiting -redness, stinging, or swelling at site where injected This list may not describe all possible side effects. Call your doctor for medical advice about side effects. You may report side effects to FDA at 1-800-FDA-1088. Where should I keep my medicine? Keep out of the reach of children. Store in a refrigerator between 2 and 8 degrees C (36 and 46 degrees F). Do not freeze. Do not shake. Throw away any unused portion if using a single-dose vial. Throw away any unused medicine after the expiration date. NOTE: This sheet is a summary. It may not cover all possible information. If you have questions about this medicine, talk to your doctor, pharmacist, or health care provider.  2015, [employer]. (2008-05-10 10:23:57)  

## 2015-11-22 NOTE — Progress Notes (Signed)
Injection not needed today..hmg at 10.3. Pt instructed to keep current schedule and call if any issues occur.

## 2015-11-24 DIAGNOSIS — E10319 Type 1 diabetes mellitus with unspecified diabetic retinopathy without macular edema: Secondary | ICD-10-CM | POA: Diagnosis not present

## 2015-11-24 DIAGNOSIS — I10 Essential (primary) hypertension: Secondary | ICD-10-CM | POA: Diagnosis not present

## 2015-11-24 DIAGNOSIS — E7800 Pure hypercholesterolemia, unspecified: Secondary | ICD-10-CM | POA: Diagnosis not present

## 2015-11-24 DIAGNOSIS — G609 Hereditary and idiopathic neuropathy, unspecified: Secondary | ICD-10-CM | POA: Diagnosis not present

## 2015-11-30 ENCOUNTER — Emergency Department (HOSPITAL_COMMUNITY): Payer: Medicare Other

## 2015-11-30 ENCOUNTER — Other Ambulatory Visit: Payer: Self-pay

## 2015-11-30 ENCOUNTER — Encounter (HOSPITAL_COMMUNITY): Payer: Self-pay | Admitting: Emergency Medicine

## 2015-11-30 DIAGNOSIS — Z87891 Personal history of nicotine dependence: Secondary | ICD-10-CM | POA: Diagnosis not present

## 2015-11-30 DIAGNOSIS — Z6841 Body Mass Index (BMI) 40.0 and over, adult: Secondary | ICD-10-CM | POA: Diagnosis not present

## 2015-11-30 DIAGNOSIS — I509 Heart failure, unspecified: Secondary | ICD-10-CM | POA: Diagnosis not present

## 2015-11-30 DIAGNOSIS — E785 Hyperlipidemia, unspecified: Secondary | ICD-10-CM | POA: Diagnosis not present

## 2015-11-30 DIAGNOSIS — E662 Morbid (severe) obesity with alveolar hypoventilation: Secondary | ICD-10-CM | POA: Diagnosis present

## 2015-11-30 DIAGNOSIS — J45909 Unspecified asthma, uncomplicated: Secondary | ICD-10-CM | POA: Diagnosis present

## 2015-11-30 DIAGNOSIS — R0602 Shortness of breath: Secondary | ICD-10-CM | POA: Diagnosis not present

## 2015-11-30 DIAGNOSIS — L409 Psoriasis, unspecified: Secondary | ICD-10-CM | POA: Diagnosis present

## 2015-11-30 DIAGNOSIS — E1140 Type 2 diabetes mellitus with diabetic neuropathy, unspecified: Secondary | ICD-10-CM | POA: Diagnosis present

## 2015-11-30 DIAGNOSIS — Z7951 Long term (current) use of inhaled steroids: Secondary | ICD-10-CM

## 2015-11-30 DIAGNOSIS — Z794 Long term (current) use of insulin: Secondary | ICD-10-CM

## 2015-11-30 DIAGNOSIS — I110 Hypertensive heart disease with heart failure: Principal | ICD-10-CM | POA: Diagnosis present

## 2015-11-30 DIAGNOSIS — Z7952 Long term (current) use of systemic steroids: Secondary | ICD-10-CM

## 2015-11-30 DIAGNOSIS — K219 Gastro-esophageal reflux disease without esophagitis: Secondary | ICD-10-CM | POA: Diagnosis not present

## 2015-11-30 DIAGNOSIS — R000 Tachycardia, unspecified: Secondary | ICD-10-CM | POA: Diagnosis present

## 2015-11-30 DIAGNOSIS — I5031 Acute diastolic (congestive) heart failure: Secondary | ICD-10-CM | POA: Diagnosis present

## 2015-11-30 DIAGNOSIS — M069 Rheumatoid arthritis, unspecified: Secondary | ICD-10-CM | POA: Diagnosis present

## 2015-11-30 DIAGNOSIS — J90 Pleural effusion, not elsewhere classified: Secondary | ICD-10-CM | POA: Diagnosis not present

## 2015-11-30 MED ORDER — ALBUTEROL SULFATE (2.5 MG/3ML) 0.083% IN NEBU
5.0000 mg | INHALATION_SOLUTION | Freq: Once | RESPIRATORY_TRACT | Status: AC
  Administered 2015-11-30: 5 mg via RESPIRATORY_TRACT

## 2015-11-30 MED ORDER — ALBUTEROL SULFATE (2.5 MG/3ML) 0.083% IN NEBU
INHALATION_SOLUTION | RESPIRATORY_TRACT | Status: AC
  Filled 2015-11-30: qty 3

## 2015-11-30 NOTE — ED Notes (Signed)
Pt here with worsening exertional SOB x 3 days with no relief with home neb treatments. Pt also reports nausea. Pt also to speak in compete sentences, no wheezes at this time. Breathing labored and shallow. Pt denies cough, fever, CP.

## 2015-12-01 ENCOUNTER — Encounter (HOSPITAL_COMMUNITY): Payer: Self-pay | Admitting: Internal Medicine

## 2015-12-01 ENCOUNTER — Other Ambulatory Visit: Payer: Self-pay

## 2015-12-01 ENCOUNTER — Inpatient Hospital Stay (HOSPITAL_COMMUNITY)
Admission: EM | Admit: 2015-12-01 | Discharge: 2015-12-02 | DRG: 292 | Disposition: A | Discharge status: Home / Self Care | Payer: Medicare Other | Attending: Internal Medicine | Admitting: Internal Medicine

## 2015-12-01 ENCOUNTER — Ambulatory Visit (HOSPITAL_COMMUNITY): Payer: Medicare Other

## 2015-12-01 ENCOUNTER — Inpatient Hospital Stay (HOSPITAL_COMMUNITY): Payer: Medicare Other

## 2015-12-01 DIAGNOSIS — E662 Morbid (severe) obesity with alveolar hypoventilation: Secondary | ICD-10-CM | POA: Diagnosis present

## 2015-12-01 DIAGNOSIS — I509 Heart failure, unspecified: Secondary | ICD-10-CM | POA: Diagnosis not present

## 2015-12-01 DIAGNOSIS — Z87891 Personal history of nicotine dependence: Secondary | ICD-10-CM | POA: Diagnosis not present

## 2015-12-01 DIAGNOSIS — D649 Anemia, unspecified: Secondary | ICD-10-CM

## 2015-12-01 DIAGNOSIS — Z794 Long term (current) use of insulin: Secondary | ICD-10-CM | POA: Diagnosis not present

## 2015-12-01 DIAGNOSIS — K219 Gastro-esophageal reflux disease without esophagitis: Secondary | ICD-10-CM | POA: Diagnosis present

## 2015-12-01 DIAGNOSIS — Z6841 Body Mass Index (BMI) 40.0 and over, adult: Secondary | ICD-10-CM | POA: Diagnosis not present

## 2015-12-01 DIAGNOSIS — E119 Type 2 diabetes mellitus without complications: Secondary | ICD-10-CM

## 2015-12-01 DIAGNOSIS — Z7952 Long term (current) use of systemic steroids: Secondary | ICD-10-CM | POA: Diagnosis not present

## 2015-12-01 DIAGNOSIS — R000 Tachycardia, unspecified: Secondary | ICD-10-CM | POA: Diagnosis present

## 2015-12-01 DIAGNOSIS — J45909 Unspecified asthma, uncomplicated: Secondary | ICD-10-CM | POA: Diagnosis present

## 2015-12-01 DIAGNOSIS — L409 Psoriasis, unspecified: Secondary | ICD-10-CM | POA: Diagnosis present

## 2015-12-01 DIAGNOSIS — Z7951 Long term (current) use of inhaled steroids: Secondary | ICD-10-CM | POA: Diagnosis not present

## 2015-12-01 DIAGNOSIS — I5031 Acute diastolic (congestive) heart failure: Secondary | ICD-10-CM

## 2015-12-01 DIAGNOSIS — R0602 Shortness of breath: Secondary | ICD-10-CM | POA: Diagnosis not present

## 2015-12-01 DIAGNOSIS — E1140 Type 2 diabetes mellitus with diabetic neuropathy, unspecified: Secondary | ICD-10-CM | POA: Diagnosis present

## 2015-12-01 DIAGNOSIS — E1165 Type 2 diabetes mellitus with hyperglycemia: Secondary | ICD-10-CM | POA: Diagnosis not present

## 2015-12-01 DIAGNOSIS — I110 Hypertensive heart disease with heart failure: Secondary | ICD-10-CM | POA: Diagnosis present

## 2015-12-01 DIAGNOSIS — E785 Hyperlipidemia, unspecified: Secondary | ICD-10-CM | POA: Diagnosis present

## 2015-12-01 DIAGNOSIS — J9620 Acute and chronic respiratory failure, unspecified whether with hypoxia or hypercapnia: Secondary | ICD-10-CM | POA: Diagnosis not present

## 2015-12-01 DIAGNOSIS — M069 Rheumatoid arthritis, unspecified: Secondary | ICD-10-CM | POA: Diagnosis present

## 2015-12-01 LAB — PROTIME-INR
INR: 1.12 (ref 0.00–1.49)
PROTHROMBIN TIME: 14.6 s (ref 11.6–15.2)

## 2015-12-01 LAB — CBC WITH DIFFERENTIAL/PLATELET
Basophils Absolute: 0 10*3/uL (ref 0.0–0.1)
Basophils Relative: 0 %
EOS PCT: 1 %
Eosinophils Absolute: 0.1 10*3/uL (ref 0.0–0.7)
HEMATOCRIT: 29.4 % — AB (ref 36.0–46.0)
HEMOGLOBIN: 9 g/dL — AB (ref 12.0–15.0)
LYMPHS ABS: 2.9 10*3/uL (ref 0.7–4.0)
Lymphocytes Relative: 27 %
MCH: 20.1 pg — ABNORMAL LOW (ref 26.0–34.0)
MCHC: 30.6 g/dL (ref 30.0–36.0)
MCV: 65.6 fL — AB (ref 78.0–100.0)
Monocytes Absolute: 0.9 10*3/uL (ref 0.1–1.0)
Monocytes Relative: 8 %
NEUTROS ABS: 6.9 10*3/uL (ref 1.7–7.7)
Neutrophils Relative %: 64 %
Platelets: 265 10*3/uL (ref 150–400)
RBC: 4.48 MIL/uL (ref 3.87–5.11)
RDW: 16.7 % — AB (ref 11.5–15.5)
WBC: 10.8 10*3/uL — AB (ref 4.0–10.5)

## 2015-12-01 LAB — COMPREHENSIVE METABOLIC PANEL
ALK PHOS: 78 U/L (ref 38–126)
ALT: 16 U/L (ref 14–54)
ANION GAP: 6 (ref 5–15)
AST: 20 U/L (ref 15–41)
Albumin: 3.3 g/dL — ABNORMAL LOW (ref 3.5–5.0)
BILIRUBIN TOTAL: 0.8 mg/dL (ref 0.3–1.2)
BUN: 7 mg/dL (ref 6–20)
CALCIUM: 8.6 mg/dL — AB (ref 8.9–10.3)
CO2: 27 mmol/L (ref 22–32)
Chloride: 105 mmol/L (ref 101–111)
Creatinine, Ser: 0.77 mg/dL (ref 0.44–1.00)
GFR calc Af Amer: >60 mL/min (ref >60)
GLUCOSE: 88 mg/dL (ref 65–99)
POTASSIUM: 3.5 mmol/L (ref 3.5–5.1)
Sodium: 138 mmol/L (ref 135–145)
TOTAL PROTEIN: 6.4 g/dL — AB (ref 6.5–8.1)

## 2015-12-01 LAB — GLUCOSE, CAPILLARY
GLUCOSE-CAPILLARY: 148 mg/dL — AB (ref 65–99)
GLUCOSE-CAPILLARY: 281 mg/dL — AB (ref 65–99)
Glucose-Capillary: 78 mg/dL (ref 65–99)
Glucose-Capillary: 248 mg/dL — ABNORMAL HIGH (ref 65–99)

## 2015-12-01 LAB — I-STAT TROPONIN, ED: Troponin i, poc: 0 ng/mL (ref 0.00–0.08)

## 2015-12-01 LAB — TROPONIN I: Troponin I: <0.03 ng/mL (ref <0.031)

## 2015-12-01 LAB — APTT: APTT: 28 s (ref 24–37)

## 2015-12-01 LAB — MRSA PCR SCREENING: MRSA by PCR: NEGATIVE

## 2015-12-01 LAB — POCT I-STAT TROPONIN I: TROPONIN I, POC: 0 ng/mL (ref 0.00–0.08)

## 2015-12-01 LAB — TSH: TSH: 2.997 u[IU]/mL (ref 0.350–4.500)

## 2015-12-01 LAB — D-DIMER, QUANTITATIVE: D-Dimer, Quant: 3.16 ug/mL-FEU — ABNORMAL HIGH (ref 0.00–0.50)

## 2015-12-01 LAB — BRAIN NATRIURETIC PEPTIDE: B NATRIURETIC PEPTIDE 5: 163.2 pg/mL — AB (ref 0.0–100.0)

## 2015-12-01 MED ORDER — HYDRALAZINE HCL 20 MG/ML IJ SOLN: 10.0000 mg | Freq: Four times a day (QID) | INTRAMUSCULAR | Status: DC | PRN

## 2015-12-01 MED ORDER — ACETAMINOPHEN 325 MG PO TABS
650.0000 mg | ORAL_TABLET | ORAL | Status: DC | PRN
  Administered 2015-12-01: 650 mg via ORAL
  Filled 2015-12-01 (×2): qty 2

## 2015-12-01 MED ORDER — GABAPENTIN 300 MG PO CAPS
600.0000 mg | ORAL_CAPSULE | Freq: Three times a day (TID) | ORAL | Status: DC
  Administered 2015-12-01 – 2015-12-02 (×4): 600 mg via ORAL
  Filled 2015-12-01 (×4): qty 2

## 2015-12-01 MED ORDER — ROSUVASTATIN CALCIUM 20 MG PO TABS
20.0000 mg | ORAL_TABLET | Freq: Every day | ORAL | Status: DC
  Administered 2015-12-01 – 2015-12-02 (×2): 20 mg via ORAL
  Filled 2015-12-01 (×2): qty 1

## 2015-12-01 MED ORDER — TECHNETIUM TO 99M ALBUMIN AGGREGATED
4.4000 | Freq: Once | INTRAVENOUS | Status: AC | PRN
  Administered 2015-12-01: 4 via INTRAVENOUS

## 2015-12-01 MED ORDER — IRBESARTAN 300 MG PO TABS
300.0000 mg | ORAL_TABLET | Freq: Every day | ORAL | Status: DC
  Administered 2015-12-01 – 2015-12-02 (×2): 300 mg via ORAL
  Filled 2015-12-01 (×2): qty 1

## 2015-12-01 MED ORDER — ENOXAPARIN SODIUM 80 MG/0.8ML ~~LOC~~ SOLN
0.5000 mg/kg | SUBCUTANEOUS | Status: DC
  Administered 2015-12-01 – 2015-12-02 (×2): 70 mg via SUBCUTANEOUS
  Filled 2015-12-01 (×2): qty 0.8

## 2015-12-01 MED ORDER — INSULIN NPH (HUMAN) (ISOPHANE) 100 UNIT/ML ~~LOC~~ SUSP
75.0000 [IU] | Freq: Three times a day (TID) | SUBCUTANEOUS | Status: DC
  Filled 2015-12-01: qty 10

## 2015-12-01 MED ORDER — NEBIVOLOL HCL 10 MG PO TABS
20.0000 mg | ORAL_TABLET | Freq: Every day | ORAL | Status: DC
  Administered 2015-12-01 – 2015-12-02 (×2): 20 mg via ORAL
  Filled 2015-12-01 (×2): qty 2

## 2015-12-01 MED ORDER — DILTIAZEM HCL ER COATED BEADS 240 MG PO CP24
240.0000 mg | ORAL_CAPSULE | Freq: Every day | ORAL | Status: DC
  Administered 2015-12-01 – 2015-12-02 (×2): 240 mg via ORAL
  Filled 2015-12-01 (×2): qty 1

## 2015-12-01 MED ORDER — PREDNISONE 5 MG PO TABS
5.0000 mg | ORAL_TABLET | Freq: Every day | ORAL | Status: DC
Start: 2015-12-01 — End: 2015-12-02
  Administered 2015-12-01 – 2015-12-02 (×2): 5 mg via ORAL
  Filled 2015-12-01 (×2): qty 1

## 2015-12-01 MED ORDER — LEFLUNOMIDE 20 MG PO TABS
20.0000 mg | ORAL_TABLET | Freq: Every day | ORAL | Status: DC
  Administered 2015-12-01 – 2015-12-02 (×2): 20 mg via ORAL
  Filled 2015-12-01 (×2): qty 1

## 2015-12-01 MED ORDER — INSULIN NPH (HUMAN) (ISOPHANE) 100 UNIT/ML ~~LOC~~ SUSP
35.0000 [IU] | Freq: Three times a day (TID) | SUBCUTANEOUS | Status: DC
  Administered 2015-12-01: 35 [IU] via SUBCUTANEOUS
  Filled 2015-12-01: qty 10

## 2015-12-01 MED ORDER — TIZANIDINE HCL 4 MG PO TABS
4.0000 mg | ORAL_TABLET | Freq: Two times a day (BID) | ORAL | Status: DC | PRN
  Administered 2015-12-01: 4 mg via ORAL
  Filled 2015-12-01: qty 1

## 2015-12-01 MED ORDER — FUROSEMIDE 10 MG/ML IJ SOLN
40.0000 mg | Freq: Every day | INTRAMUSCULAR | Status: DC
  Administered 2015-12-01 – 2015-12-02 (×2): 40 mg via INTRAVENOUS
  Filled 2015-12-01 (×2): qty 4

## 2015-12-01 MED ORDER — ENOXAPARIN SODIUM 40 MG/0.4ML ~~LOC~~ SOLN: 40.0000 mg | SUBCUTANEOUS | Status: DC

## 2015-12-01 MED ORDER — TECHNETIUM TC 99M DIETHYLENETRIAME-PENTAACETIC ACID: 30.1000 | Freq: Once | INTRAVENOUS | Status: DC | PRN

## 2015-12-01 MED ORDER — ONDANSETRON HCL 4 MG/2ML IJ SOLN: 4.0000 mg | Freq: Four times a day (QID) | INTRAMUSCULAR | Status: DC | PRN

## 2015-12-01 MED ORDER — NITROGLYCERIN 2 % TD OINT
0.5000 [in_us] | TOPICAL_OINTMENT | Freq: Three times a day (TID) | TRANSDERMAL | Status: DC
  Administered 2015-12-01 – 2015-12-02 (×3): 0.5 [in_us] via TOPICAL
  Filled 2015-12-01: qty 30

## 2015-12-01 MED ORDER — INSULIN ASPART 100 UNIT/ML ~~LOC~~ SOLN
0.0000 [IU] | Freq: Three times a day (TID) | SUBCUTANEOUS | Status: DC
  Administered 2015-12-01: 5 [IU] via SUBCUTANEOUS
  Administered 2015-12-01: 2 [IU] via SUBCUTANEOUS
  Administered 2015-12-02: 8 [IU] via SUBCUTANEOUS
  Administered 2015-12-02: 5 [IU] via SUBCUTANEOUS

## 2015-12-01 MED ORDER — ABATACEPT 250 MG IV SOLR: 1000.0000 mg | INTRAVENOUS | Status: DC

## 2015-12-01 MED ORDER — SODIUM CHLORIDE 0.9 % IV SOLN: 250.0000 mL | INTRAVENOUS | Status: DC | PRN

## 2015-12-01 MED ORDER — SODIUM CHLORIDE 0.9% FLUSH: 3.0000 mL | INTRAVENOUS | Status: DC | PRN

## 2015-12-01 MED ORDER — FUROSEMIDE 10 MG/ML IJ SOLN
40.0000 mg | Freq: Once | INTRAMUSCULAR | Status: AC
  Administered 2015-12-01: 40 mg via INTRAVENOUS
  Filled 2015-12-01: qty 4

## 2015-12-01 MED ORDER — TOPIRAMATE 100 MG PO TABS
200.0000 mg | ORAL_TABLET | Freq: Every day | ORAL | Status: DC
  Administered 2015-12-01 – 2015-12-02 (×2): 200 mg via ORAL
  Filled 2015-12-01 (×2): qty 2

## 2015-12-01 MED ORDER — AMITRIPTYLINE HCL 25 MG PO TABS
25.0000 mg | ORAL_TABLET | Freq: Every day | ORAL | Status: DC
Start: 2015-12-01 — End: 2015-12-02
  Administered 2015-12-01: 25 mg via ORAL
  Filled 2015-12-01: qty 1

## 2015-12-01 MED ORDER — FLUTICASONE PROPIONATE 50 MCG/ACT NA SUSP: 1.0000 | Freq: Every day | NASAL | Status: DC | PRN

## 2015-12-01 MED ORDER — PANTOPRAZOLE SODIUM 40 MG PO TBEC
40.0000 mg | DELAYED_RELEASE_TABLET | Freq: Every day | ORAL | Status: DC
  Administered 2015-12-01 – 2015-12-02 (×2): 40 mg via ORAL
  Filled 2015-12-01 (×2): qty 1

## 2015-12-01 MED ORDER — SODIUM CHLORIDE 0.9% FLUSH
3.0000 mL | Freq: Two times a day (BID) | INTRAVENOUS | Status: DC
  Administered 2015-12-01 – 2015-12-02 (×2): 3 mL via INTRAVENOUS

## 2015-12-01 MED ORDER — INSULIN ASPART 100 UNIT/ML ~~LOC~~ SOLN
0.0000 [IU] | Freq: Every day | SUBCUTANEOUS | Status: DC
  Administered 2015-12-01: 3 [IU] via SUBCUTANEOUS

## 2015-12-01 NOTE — Progress Notes (Signed)
1505 report received from [facility]

## 2015-12-01 NOTE — Progress Notes (Addendum)
Patient ID: Jessica Howard, female   DOB: 1960-12-17, 55 y.o.   MRN: [id number] Patient admitted after midnight. Patient seen and examined at bedside. For details please refer to admission note done 12/01/2015.  55 y.o. Female with past medical history significant for hypertension, hyperlipidemia, diabetes mellitus, rheumatoid arthritis, psoriasis, obstructive sleep apnea who presented with worsening shortness of breath at rest and with exertion for past 2 days prior to this admission. No associated chest pain, palpitations. No cough or fevers. No reports of weight gain.  Patient was hemodynamically stable on the admission. Chest x-ray showed congestive heart failure. BNP was elevated at 163. No acute ischemic changes seen on 12-lead EKG. Patient started on Lasix 40 mg IV daily. Her weight is 138 kg. We'll continue to monitor for next 24 hours. Follow-up 2-D echo results.  Leisa Lenz [facility] [id number]

## 2015-12-01 NOTE — Procedures (Signed)
Placed patient on BIPAP auto IPAP max 15cm H20, min EPAP 5cm H20 for the night. Patient is tolerating well at this time.

## 2015-12-01 NOTE — ED Notes (Signed)
Admitting MD at bedside.

## 2015-12-01 NOTE — Care Management Important Message (Signed)
Important Message  Patient Details  Name: Jessica Howard MRN: [id number] Date of Birth: March 20, 1961   Medicare Important Message Given:  Yes    Loann Quill 12/01/2015, 9:46 AM

## 2015-12-01 NOTE — Progress Notes (Signed)
  Echocardiogram 2D Echocardiogram has been performed.  Jessica Howard 12/01/2015, 6:12 PM

## 2015-12-01 NOTE — H&P (Addendum)
TRH H&P   Patient Demographics:    Jessica Howard, is a 55 y.o. female  MRN: [id number]   DOB - [birth year]  Admit Date - 12/01/2015  Outpatient Primary MD for the patient is Merrilee Seashore, MD  Referring MD/NP/PA: Gareth Morgan,  Outpatient Specialists:  Hassan Buckler (endocrinologist), Dartha Lodge (rheumatologist)  Patient coming from: home  Chief Complaint  Patient presents with  . Shortness of Breath      HPI:    Jessica Howard  is a 55 y.o. female, hypertension, hyperlipidemia, dm2, RA, psoriasis, OSA on cpap apparently c/o dyspnea for the past 2 days ,  Worse with exertion.  Pt gave herself a breathing treatment without benefit.  Pt denies fever, chills, cough, cp, palp.  + lower ext edema.  Denies weight gain, orthopnea, pnd.  Pt presented to ED for evaluation.   In ED CXR => chf, bnp elevated at 163.2  EKG st at 100, nl axis, nl int, no st-t segment changes c/w ischemia.  Pt is noted to be anemic. hgb 9.0  Pt will be admitted for CHF.      Review of systems:    In addition to the HPI above, No Fever-chills, No Headache, No changes with Vision or hearing, No problems swallowing food or Liquids, No Chest pain, Cough  No Abdominal pain, No Nausea or Vommitting, Bowel movements are regular, No Blood in stool or Urine, No dysuria, No new skin rashes or bruises, + joints pains-aches,  No new weakness, tingling, numbness in any extremity, No recent weight gain or loss, No polyuria, polydypsia or polyphagia, No significant Mental Stressors.  A full 10 point Review of Systems was done, except as stated above, all other Review of Systems were negative.   With Past History of the following :    Past Medical History  Diagnosis Date  . Hypertension   . Hyperlipidemia   . Endometrial polyp   . Fibroadenoma of breast     left  . Hepatic steatosis   . Sleep  apnea   . Nontraumatic tear of right rotator cuff   . Impingement syndrome of right shoulder   . Unspecified deficiency anemia     Thalassemia  . Diabetes mellitus     type 2  . Rheumatoid arthritis(714.0)     rhematoid and osteoarthritis  . Diabetic neuropathy ([place])   . Asthma   . Pneumonia   . Headache     migraines  . Thyroid disease     had radiation  . GERD (gastroesophageal reflux disease)   . Psoriasis       Past Surgical History  Procedure Laterality Date  . Tubal ligation      bilateral  . Hand surgery      for arthritis  . Tonsillectomy    . Left knee surgery nodule removal    . Other surgical history  multiple nodule removal on neck, hands, left knee  . Dilation and curettage of uterus    . Eye surgery      surgery on retina  . Cesarean section    . Ankle arthroscopy Right   . Colonoscopy    . Shoulder arthroscopy with rotator cuff repair and subacromial decompression Right 05/20/2014    Procedure: RIGHT SHOULDER ARTHROSCOPY WITH DEBRIDEMENT/DISTAL CLAVICLE EXCISION/ROTATOR CUFF REPAIR AND SUBACROMIAL DECOMPRESSION;  Surgeon: Lorn Junes, MD;  Location: [facility];  Service: Orthopedics;  Laterality: Right;      Social History:     Social History  Substance Use Topics  . Smoking status: Former Smoker -- 0.25 packs/day for 6 years    Types: Cigarettes    Quit date: 06/09/2013  . Smokeless tobacco: Never Used  . Alcohol Use: No     Lives - at home  Mobility - ambulates without assitance     Family History :     Family History  Problem Relation Age of Onset  . Diabetes Mother   . Cancer Mother     breast  . Diabetes Sister   . Cancer Brother     bone marrow cancer  . Aneurysm Father     brain      Home Medications:   Prior to Admission medications   Medication Sig Start Date End Date Taking? Authorizing Provider  abatacept (ORENCIA) 250 MG injection Inject 1,000 mg into the vein every 28 (twenty-eight) days. Monthly Infusion    Yes Historical Provider, MD  amitriptyline (ELAVIL) 25 MG tablet Take 25 mg by mouth at bedtime.    Yes Historical Provider, MD  BYSTOLIC 10 MG tablet Take 20 mg by mouth daily.  05/29/14  Yes Historical Provider, MD  chlorthalidone (HYGROTON) 25 MG tablet Take 25 mg by mouth daily.   Yes Historical Provider, MD  CRESTOR 20 MG tablet Take 20 mg by mouth daily.  03/14/14  Yes Historical Provider, MD  diltiazem (CARDIZEM CD) 240 MG 24 hr capsule Take 240 mg by mouth daily.  02/03/14  Yes Historical Provider, MD  fluticasone (FLONASE) 50 MCG/ACT nasal spray Place 1 spray into both nostrils daily as needed for allergies or rhinitis.   Yes Historical Provider, MD  gabapentin (NEURONTIN) 300 MG capsule Take 600 mg by mouth 3 (three) times daily.    Yes Historical Provider, MD  insulin aspart (NOVOLOG) 100 UNIT/ML injection Inject 55-60 Units into the skin 3 (three) times daily before meals. 55 base Plus sliding scale   Yes Historical Provider, MD  insulin NPH (HUMULIN N,NOVOLIN N) 100 UNIT/ML injection Inject 75 Units into the skin 3 (three) times daily with meals. 75 units with breakfast; lunch 75 units; 75 units at dinner   Yes Historical Provider, MD  irbesartan (AVAPRO) 300 MG tablet Take 300 mg by mouth daily.    Yes Historical Provider, MD  leflunomide (ARAVA) 20 MG tablet Take 20 mg by mouth daily.   Yes Historical Provider, MD  metFORMIN (GLUCOPHAGE-XR) 500 MG 24 hr tablet Take 500 mg by mouth every evening.   Yes Historical Provider, MD  pantoprazole (PROTONIX) 40 MG tablet Take 40 mg by mouth daily.   Yes Historical Provider, MD  predniSONE (DELTASONE) 5 MG tablet Take 5 mg by mouth daily.   Yes Historical Provider, MD  tiZANidine (ZANAFLEX) 4 MG tablet Take 4 mg by mouth 2 (two) times daily as needed (migraines). Limit 2 days per week 01/12/14  Yes Historical Provider, MD  topiramate (  TOPAMAX) 100 MG tablet Take 200 mg by mouth daily.  03/14/14  Yes Historical Provider, MD  TRULICITY [id number]  SOPN Inject 0.75 mg into the skin once a week. Patient takes on Wednesday. 08/29/15  Yes Historical Provider, MD  VOLTAREN 1 % GEL Apply 1 application topically 3 (three) times daily.  02/03/14  Yes Historical Provider, MD     Allergies:     Allergies  Allergen Reactions  . Bactrim [Sulfamethoxazole-Trimethoprim] Hives  . Cefuroxime Axetil Itching  . Cephalosporins Itching  . Lisinopril Cough  . Penicillins Hives  . Sulfa Antibiotics Hives  . Iohexol Itching and Rash     Code: RASH, Desc: HAD ITCHING AND A RASH ABOUT ONE HOUR AFTER RETURNING HOME FROM THE CT, Onset Date: [id number]      Physical Exam:   Vitals  Blood pressure 155/95, pulse 99, temperature 98.8 F (37.1 C), temperature source Oral, resp. rate 35, last menstrual period 12/27/2013, SpO2 93 %.   1. General  lying in bed in NAD,    2. Normal affect and insight, Not Suicidal or Homicidal, Awake Alert, Oriented X 3.  3. No F.N deficits, ALL C.Nerves Intact, Strength 5/5 all 4 extremities, Sensation intact all 4 extremities, Plantars down going.  4. Ears and Eyes appear Normal, Conjunctivae clear, PERRLA. Moist Oral Mucosa.  5. Supple Neck, trace  JVD, No cervical lymphadenopathy appriciated, No Carotid Bruits.  6. Symmetrical Chest wall movement, Good air movement bilaterally, CTAB, slight decrease in bs at right lung base. .  7. RRR, No Gallops, Rubs or Murmurs, No Parasternal Heave.  8. Positive Bowel Sounds, Abdomen Soft, No tenderness, No organomegaly appriciated,No rebound -guarding or rigidity.  9.  No Cyanosis, Normal Skin Turgor, No Skin Rash or Bruise.  10. Good muscle tone,  joints appear normal , no effusions, Normal ROM.  11. No Palpable Lymph Nodes in Neck or Axillae     Data Review:    CBC  Recent Labs Lab 12/01/15 0204  WBC 10.8*  HGB 9.0*  HCT 29.4*  PLT 265  MCV 65.6*  MCH 20.1*  MCHC 30.6  RDW 16.7*  LYMPHSABS 2.9  MONOABS 0.9  EOSABS 0.1  BASOSABS 0.0    ------------------------------------------------------------------------------------------------------------------  Chemistries   Recent Labs Lab 12/01/15 0204  NA 138  K 3.5  CL 105  CO2 27  GLUCOSE 88  BUN 7  CREATININE 0.77  CALCIUM 8.6*  AST 20  ALT 16  ALKPHOS 78  BILITOT 0.8   ------------------------------------------------------------------------------------------------------------------ CrCl cannot be calculated (Unknown ideal weight.). ------------------------------------------------------------------------------------------------------------------ No results for input(s): TSH, T4TOTAL, T3FREE, THYROIDAB in the last 72 hours.  Invalid input(s): FREET3  Coagulation profile No results for input(s): INR, PROTIME in the last 168 hours. ------------------------------------------------------------------------------------------------------------------- No results for input(s): DDIMER in the last 72 hours. -------------------------------------------------------------------------------------------------------------------  Cardiac Enzymes No results for input(s): CKMB, TROPONINI, MYOGLOBIN in the last 168 hours.  Invalid input(s): CK ------------------------------------------------------------------------------------------------------------------    Component Value Date/Time   BNP 163.2* 12/01/2015 0204     ---------------------------------------------------------------------------------------------------------------  Urinalysis    Component Value Date/Time   COLORURINE YELLOW 02/28/2014 1351   APPEARANCEUR CLEAR 02/28/2014 1351   LABSPEC 1.016 02/28/2014 1351   PHURINE 5.0 02/28/2014 1351   GLUCOSEU NEGATIVE 02/28/2014 1351   HGBUR NEGATIVE 02/28/2014 1351   BILIRUBINUR NEGATIVE 02/28/2014 1351   KETONESUR NEGATIVE 02/28/2014 1351   PROTEINUR NEGATIVE 02/28/2014 1351   UROBILINOGEN 0.2 02/28/2014 1351   NITRITE NEGATIVE 02/28/2014 1351   LEUKOCYTESUR  NEGATIVE 02/28/2014 1351    ----------------------------------------------------------------------------------------------------------------  Imaging Results:    Dg Chest 2 View  11/30/2015  CLINICAL DATA:  Sob x 2-3 days; previous smoker; htn; diabetic EXAM: CHEST - 2 VIEW COMPARISON:  07/20/2015 FINDINGS: Interval development of pleural effusions right greater than left. Adjacent subsegmental atelectasis/ consolidation in the lung bases. Mild interstitial edema or infiltrates slightly increased. Mild cardiomegaly. No pneumothorax. Visualized bones unremarkable. IMPRESSION: 1. Cardiomegaly, new small pleural effusions, and progressive bilateral interstitial edema/infiltrates, suggesting CHF. Electronically Signed   By: Lucrezia Europe M.D.   On: 11/30/2015 22:37       Assessment & Plan:    Active Problems:   Diabetes ([place])   Congestive heart failure (CHF) (HCC)   Anemia    1. Dyspnea secondary to CHF ?  Strict i and o Check daily weight Trop i q6h x3 Check cardiac echo Lasix 40mg  iv qday NTP  2. Tachycardia Tele Trop i q6h x3 Check tsh Check echo Check d dimer , if positive then CTA chest  3. Hypertension uncontrolled Hydralazine 10mg  iv q6h prn  4.  Anemia secondary to Thallasemia Check cbc in am  DVT Prophylaxis  Lovenox - SCDs  AM Labs Ordered, also please review Full Orders  Family Communication: Admission, patients condition and plan of care including tests being ordered have been discussed with the patient  who indicate understanding and agree with the plan and Code Status.  Code Status FULL CODE  Likely DC to    Condition GUARDED    Consults called:   Admission status: inpatient  Time spent in minutes : 45 minutes   Jani Gravel M.D on 12/01/2015 at 3:49 AM  Between 7am to 7pm - Pager - [phone]. After 7pm go to [email or web address] - password [facility]  [facility] - Office  [phone]

## 2015-12-01 NOTE — ED Provider Notes (Signed)
CSN: [id number]     Arrival date & time 11/30/15  2130 History  By signing my name below, I, Jessica Howard, attest that this documentation has been prepared under the direction and in the presence of Gareth Morgan, MD. Electronically Signed: Gwenlyn Howard, ED Scribe. 12/01/2015. 2:09 AM.    Chief Complaint  Patient presents with  . Shortness of Breath    The history is provided by the patient. No language interpreter was used.    HPI Comments: Jessica Howard is a 55 y.o. female with PMHx of HTN, GERD, HLD and Asthma who presents to the Emergency Department complaining of gradual onset, constant, shortness of breath onset 2 days. Pt reports her shortness of breath is made worse with exertion. Pt reports associated bilateral leg swelling and heart palpitations. Pt denies PMHx of CHF. Pt is compliant with her medications. Pt states she self administered a breathing treatment, but with no relief. Pt denies chest pain, chest tightness, fever.  Past Medical History  Diagnosis Date  . Hypertension   . Hyperlipidemia   . Endometrial polyp   . Fibroadenoma of breast     left  . Hepatic steatosis   . Sleep apnea   . Nontraumatic tear of right rotator cuff   . Impingement syndrome of right shoulder   . Unspecified deficiency anemia     Thalassemia  . Diabetes mellitus     type 2  . Rheumatoid arthritis(714.0)     rhematoid and osteoarthritis  . Diabetic neuropathy ([place])   . Asthma   . Pneumonia   . Headache     migraines  . Thyroid disease     had radiation  . GERD (gastroesophageal reflux disease)   . Psoriasis    Past Surgical History  Procedure Laterality Date  . Tubal ligation      bilateral  . Hand surgery      for arthritis  . Tonsillectomy    . Left knee surgery nodule removal    . Other surgical history      multiple nodule removal on neck, hands, left knee  . Dilation and curettage of uterus    . Eye surgery      surgery on retina  . Cesarean section    . Ankle  arthroscopy Right   . Colonoscopy    . Shoulder arthroscopy with rotator cuff repair and subacromial decompression Right 05/20/2014    Procedure: RIGHT SHOULDER ARTHROSCOPY WITH DEBRIDEMENT/DISTAL CLAVICLE EXCISION/ROTATOR CUFF REPAIR AND SUBACROMIAL DECOMPRESSION;  Surgeon: Lorn Junes, MD;  Location: [facility];  Service: Orthopedics;  Laterality: Right;   Family History  Problem Relation Age of Onset  . Diabetes Mother   . Cancer Mother     breast  . Diabetes Sister   . Cancer Brother     bone marrow cancer  . Aneurysm Father     brain   Social History  Substance Use Topics  . Smoking status: Former Smoker -- 0.25 packs/day for 6 years    Types: Cigarettes    Quit date: 06/09/2013  . Smokeless tobacco: Never Used  . Alcohol Use: No   OB History    Gravida Para Term Preterm AB TAB SAB Ectopic Multiple Living   3 2 2  0 1 0 1 0 0 2     Review of Systems  Constitutional: Negative for fever.  HENT: Negative for sore throat.   Eyes: Negative for visual disturbance.  Respiratory: Positive for shortness of breath. Negative for cough, chest  tightness and wheezing.   Cardiovascular: Positive for palpitations and leg swelling. Negative for chest pain.  Gastrointestinal: Negative for nausea, vomiting and abdominal pain.  Genitourinary: Negative for difficulty urinating.  Musculoskeletal: Negative for back pain and neck pain.  Skin: Negative for rash.  Neurological: Negative for syncope and headaches.      Allergies  Bactrim; Cefuroxime axetil; Cephalosporins; Lisinopril; Penicillins; Sulfa antibiotics; and Iohexol  Home Medications   Prior to Admission medications   Medication Sig Start Date End Date Taking? Authorizing Provider  abatacept (ORENCIA) 250 MG injection Inject 1,000 mg into the vein every 28 (twenty-eight) days. Monthly Infusion   Yes Historical Provider, MD  amitriptyline (ELAVIL) 25 MG tablet Take 25 mg by mouth at bedtime.    Yes Historical Provider, MD   BYSTOLIC 10 MG tablet Take 20 mg by mouth daily.  05/29/14  Yes Historical Provider, MD  chlorthalidone (HYGROTON) 25 MG tablet Take 25 mg by mouth daily.   Yes Historical Provider, MD  CRESTOR 20 MG tablet Take 20 mg by mouth daily.  03/14/14  Yes Historical Provider, MD  diltiazem (CARDIZEM CD) 240 MG 24 hr capsule Take 240 mg by mouth daily.  02/03/14  Yes Historical Provider, MD  fluticasone (FLONASE) 50 MCG/ACT nasal spray Place 1 spray into both nostrils daily as needed for allergies or rhinitis.   Yes Historical Provider, MD  gabapentin (NEURONTIN) 300 MG capsule Take 600 mg by mouth 3 (three) times daily.    Yes Historical Provider, MD  insulin aspart (NOVOLOG) 100 UNIT/ML injection Inject 55-60 Units into the skin 3 (three) times daily before meals. 55 base Plus sliding scale   Yes Historical Provider, MD  insulin NPH (HUMULIN N,NOVOLIN N) 100 UNIT/ML injection Inject 75 Units into the skin 3 (three) times daily with meals. 75 units with breakfast; lunch 75 units; 75 units at dinner   Yes Historical Provider, MD  irbesartan (AVAPRO) 300 MG tablet Take 300 mg by mouth daily.    Yes Historical Provider, MD  leflunomide (ARAVA) 20 MG tablet Take 20 mg by mouth daily.   Yes Historical Provider, MD  metFORMIN (GLUCOPHAGE-XR) 500 MG 24 hr tablet Take 500 mg by mouth every evening.   Yes Historical Provider, MD  pantoprazole (PROTONIX) 40 MG tablet Take 40 mg by mouth daily.   Yes Historical Provider, MD  predniSONE (DELTASONE) 5 MG tablet Take 5 mg by mouth daily.   Yes Historical Provider, MD  tiZANidine (ZANAFLEX) 4 MG tablet Take 4 mg by mouth 2 (two) times daily as needed (migraines). Limit 2 days per week 01/12/14  Yes Historical Provider, MD  topiramate (TOPAMAX) 100 MG tablet Take 200 mg by mouth daily.  03/14/14  Yes Historical Provider, MD  TRULICITY [id number] SOPN Inject 0.75 mg into the skin once a week. Patient takes on Wednesday. 08/29/15  Yes Historical Provider, MD  VOLTAREN 1 % GEL  Apply 1 application topically 3 (three) times daily.  02/03/14  Yes Historical Provider, MD   BP 121/78 mmHg  Pulse 88  Temp(Src) 98.4 F (36.9 C) (Oral)  Resp 20  Ht 5\' 3"  (1.6 m)  Wt 304 lb 14.3 oz (138.3 kg)  BMI 54.02 kg/m2  SpO2 100%  LMP 12/27/2013 Physical Exam  Constitutional: She is oriented to person, place, and time. She appears well-developed and well-nourished. No distress.  HENT:  Head: Normocephalic and atraumatic.  Eyes: Conjunctivae and EOM are normal.  Neck: Normal range of motion. JVD: Unable to review due to  body habitus.  Cardiovascular: Regular rhythm, normal heart sounds and intact distal pulses.  Tachycardia present.  Exam reveals no gallop and no friction rub.   No murmur heard. Pulmonary/Chest: Effort normal and breath sounds normal. No respiratory distress. She has no wheezes. She has no rales.  Bibasilar crackles  Abdominal: Soft. She exhibits no distension. There is no tenderness. There is no guarding.  Musculoskeletal: Normal range of motion. She exhibits edema. She exhibits no tenderness.  Trace pitting edema  Neurological: She is alert and oriented to person, place, and time.  Skin: Skin is warm and dry. No rash noted. She is not diaphoretic. No erythema.  Psychiatric: She has a normal mood and affect. Her behavior is normal. Judgment normal.  Nursing note and vitals reviewed.   ED Course  Procedures (including critical care time)  DIAGNOSTIC STUDIES: Oxygen Saturation is 93% on RA, low by my interpretation.    COORDINATION OF CARE: 1:43 AM Discussed treatment plan with pt at bedside which includes DG Chest and pt agreed to plan.  Labs Review Labs Reviewed  CBC WITH DIFFERENTIAL/PLATELET - Abnormal; Notable for the following:    WBC 10.8 (*)    Hemoglobin 9.0 (*)    HCT 29.4 (*)    MCV 65.6 (*)    MCH 20.1 (*)    RDW 16.7 (*)    All other components within normal limits  COMPREHENSIVE METABOLIC PANEL - Abnormal; Notable for the  following:    Calcium 8.6 (*)    Total Protein 6.4 (*)    Albumin 3.3 (*)    All other components within normal limits  BRAIN NATRIURETIC PEPTIDE - Abnormal; Notable for the following:    B Natriuretic Peptide 163.2 (*)    All other components within normal limits  D-DIMER, QUANTITATIVE (NOT AT [facility] - Abnormal; Notable for the following:    D-Dimer, Quant 3.16 (*)    All other components within normal limits  GLUCOSE, CAPILLARY - Abnormal; Notable for the following:    Glucose-Capillary 148 (*)    All other components within normal limits  MRSA PCR SCREENING  TSH  TROPONIN I  TROPONIN I  APTT  PROTIME-INR  GLUCOSE, CAPILLARY  HEMOGLOBIN A1C  TROPONIN I  I-STAT TROPOININ, ED  I-STAT TROPOININ, ED  POCT I-STAT TROPONIN I  Randolm Idol, ED    Imaging Review Dg Chest 2 View  11/30/2015  CLINICAL DATA:  Sob x 2-3 days; previous smoker; htn; diabetic EXAM: CHEST - 2 VIEW COMPARISON:  07/20/2015 FINDINGS: Interval development of pleural effusions right greater than left. Adjacent subsegmental atelectasis/ consolidation in the lung bases. Mild interstitial edema or infiltrates slightly increased. Mild cardiomegaly. No pneumothorax. Visualized bones unremarkable. IMPRESSION: 1. Cardiomegaly, new small pleural effusions, and progressive bilateral interstitial edema/infiltrates, suggesting CHF. Electronically Signed   By: Lucrezia Europe M.D.   On: 11/30/2015 22:37   Nm Pulmonary Perf And Vent  12/01/2015  CLINICAL DATA:  55 year old female with progressive shortness of breath for 2 days. Morbid obesity. Initial encounter. EXAM: NUCLEAR MEDICINE VENTILATION - PERFUSION LUNG SCAN TECHNIQUE: Ventilation images were obtained in multiple projections using inhaled aerosol Tc-58m DTPA. Perfusion images were obtained in multiple projections after intravenous injection of Tc-110m MAA. RADIOPHARMACEUTICALS:  30.1 MCi Technetium-14m DTPA aerosol inhalation and 4.4 mCi Technetium-49m MAA IV COMPARISON:   Chest radiographs 11/30/2015. FINDINGS: Comparison chest radiographs demonstrated small pleural effusions and increased perihilar interstitial opacity favored due to edema. Ventilation: Clumping of radiotracer activity about the hilum. No definite ventilation defect.  Perfusion: Normal, homogeneous radiotracer activity distribution in both lungs. IMPRESSION: Normal bilateral pulmonary perfusion, no evidence of acute pulmonary embolus. Electronically Signed   By: Genevie Ann M.D.   On: 12/01/2015 14:56   I have personally reviewed and evaluated these images and lab results as part of my medical decision-making.   EKG Interpretation   Date/Time:  Friday December 01 2015 02:05:55 EDT Ventricular Rate:  100 PR Interval:    QRS Duration: 80 QT Interval:  366 QTC Calculation: 473 R Axis:   44 Text Interpretation:  Sinus tachycardia Low voltage, precordial leads No  significant change since last tracing Confirmed by [facility] MD, Aviela Blundell  ([id number]) on 12/01/2015 3:02:19 AM      MDM   Final diagnoses:  Acute congestive heart failure, unspecified congestive heart failure type [facility])   55 year old female with a history of hypertension, hyperlipidemia, diabetes, rheumatoid arthritis, thalassemia, asthma presents with concern for 2 days of worsening dyspnea.  Differential diagnosis for dyspnea includes ACS, PE, COPD exacerbation, CHF exacerbation, anemia, pneumonia.  Chest x-ray was done which showed mild pulmonary edema and pleural effusions. EKG was evaluated by me which showed sinus tachycardia.  BNP was 156, suspect underestimation given obesity.  Given findings of CHF on XR, orthopnea, decreased suspicion at this time for PE and will assess response to diuresis. No wheezing to suggest asthma. Given 40 IV lasix and will admit to Hospitalist for new onset CHF exacerbation.   I personally performed the services described in this documentation, which was scribed in my presence. The recorded information has been  reviewed and is accurate.   Gareth Morgan, MD 12/01/15 [phone]

## 2015-12-01 NOTE — Plan of Care (Signed)
Problem: Food- and Nutrition-Related Knowledge Deficit (NB-1.1) Goal: Nutrition education Formal process to instruct or train a patient/client in a skill or to impart knowledge to help patients/clients voluntarily manage or modify food choices and eating behavior to maintain or improve health. Outcome: Adequate for Discharge Nutrition Education Note  RD consulted for nutrition education regarding new onset CHF.  Pt reports she used to eat out a lot, but recently started making changes to her diet to assist with weight management. She reports over the past 3-4 months, she has been mainly cooking her own foods (baked or grilled protein on Sanjuana Letters,, canned vegetables rinsed and drained prior to cooking, and seasoning foods with Mrs Deliah Boston, garlic powder, and pepper). Pt also reports that she started drinking more water and low calorie beverages. She estimates she has lost about 15# during this time frame, however expressed frustration over weight fluctuations associated with fluid.   RD provided "Low Sodium Nutrition Therapy" and "Sodium Free Seasoning Tips" handouts from the Academy of Nutrition and Dietetics. Reviewed patient's dietary recall. Provided examples on ways to decrease sodium intake in diet. Discouraged intake of processed foods and use of salt shaker. Encouraged fresh fruits and vegetables as well as whole grain sources of carbohydrates to maximize fiber intake.   RD discussed why it is important for patient to adhere to diet recommendations, and emphasized the role of fluids, foods to avoid, and importance of weighing self daily. Teach back method used.  Expect fair to good compliance.  Body mass index is 54.02 kg/(m^2). Pt meets criteria for extreme obesity, class III based on current BMI.  Current diet order is Heart Healthy/Carb Modified, patient is consuming approximately 100% of meals at this time. Labs and medications reviewed. No further nutrition interventions  warranted at this time. RD contact information provided. If additional nutrition issues arise, please re-consult RD.   Natalie Mceuen A. Jimmye Norman, RD, LDN, CDE Pager: [phone] After hours Pager: [phone]

## 2015-12-01 NOTE — Care Management Note (Signed)
Case Management Note  Patient Details  Name: Jessica Howard MRN: [id number] Date of Birth: 11-06-1960  Subjective/Objective:        Admitted with CHF            Action/Plan: Patient lives at home with daughter, goes to the [facility] for primary care; private insurance with [employer] /Medicaid with prescription drug coverage; pharmacy of choice is [facility]; patient reports no problem getting her medication. She does not have scales at home but plans to get it after discharge. CM talked to the patient about the importance of monitoring her weight. She eats out often and is trying to watch the sodium in her diet. Dietary consult made for further education. She does not exercise due to back pain and she is seeing a physician for this issue. Referral made to [facility] CHF program.  Expected Discharge Date:    possibly 12/04/2015              Expected Discharge Plan:  Home/Self Care  In-House Referral:  Nutrition  Discharge planning Services  CM Consult Choice offered to:  Patient  Status of Service:  In process, will continue to follow  Sherrilyn Rist [id number] 12/01/2015, 11:35 AM

## 2015-12-02 DIAGNOSIS — J9620 Acute and chronic respiratory failure, unspecified whether with hypoxia or hypercapnia: Secondary | ICD-10-CM

## 2015-12-02 LAB — GLUCOSE, CAPILLARY
GLUCOSE-CAPILLARY: 259 mg/dL — AB (ref 65–99)
Glucose-Capillary: 221 mg/dL — ABNORMAL HIGH (ref 65–99)

## 2015-12-02 LAB — ECHOCARDIOGRAM COMPLETE
E decel time: 275 msec
EERAT: 10.82
FS: 42 % (ref 28–44)
Height: 63 in
IVS/LV PW RATIO, ED: 1.02
LA diam end sys: 48 mm
LA diam index: 1.87 cm/m2
LA vol A4C: 48 ml
LASIZE: 48 mm
LAVOL: 49.8 mL
LAVOLIN: 19.4 mL/m2
LV e' LATERAL: 11 cm/s
LVEEAVG: 10.82
LVEEMED: 10.82
LVOT area: 3.8 cm2
LVOT diameter: 22 mm
Lateral S' vel: 13 cm/s
MV Dec: 275
MV Peak grad: 6 mmHg
MVPKAVEL: 114 m/s
MVPKEVEL: 119 m/s
PW: 13.3 mm — AB (ref 0.6–1.1)
TAPSE: 20.4 mm
TDI e' lateral: 11
TDI e' medial: 7.18
Weight: 4878.34 oz

## 2015-12-02 LAB — COMPREHENSIVE METABOLIC PANEL
ALK PHOS: 80 U/L (ref 38–126)
ALT: 17 U/L (ref 14–54)
ANION GAP: 8 (ref 5–15)
AST: 19 U/L (ref 15–41)
Albumin: 3.1 g/dL — ABNORMAL LOW (ref 3.5–5.0)
BUN: 14 mg/dL (ref 6–20)
CALCIUM: 8.7 mg/dL — AB (ref 8.9–10.3)
CO2: 28 mmol/L (ref 22–32)
CREATININE: 0.98 mg/dL (ref 0.44–1.00)
Chloride: 99 mmol/L — ABNORMAL LOW (ref 101–111)
Glucose, Bld: 225 mg/dL — ABNORMAL HIGH (ref 65–99)
Potassium: 3.7 mmol/L (ref 3.5–5.1)
Sodium: 135 mmol/L (ref 135–145)
TOTAL PROTEIN: 6.6 g/dL (ref 6.5–8.1)
Total Bilirubin: 0.6 mg/dL (ref 0.3–1.2)

## 2015-12-02 LAB — CBC WITH DIFFERENTIAL/PLATELET
BASOS PCT: 0 %
Basophils Absolute: 0 10*3/uL (ref 0.0–0.1)
EOS ABS: 0.1 10*3/uL (ref 0.0–0.7)
Eosinophils Relative: 1 %
HCT: 29.3 % — ABNORMAL LOW (ref 36.0–46.0)
Hemoglobin: 9 g/dL — ABNORMAL LOW (ref 12.0–15.0)
LYMPHS PCT: 35 %
Lymphs Abs: 3.2 10*3/uL (ref 0.7–4.0)
MCH: 20.8 pg — AB (ref 26.0–34.0)
MCHC: 30.7 g/dL (ref 30.0–36.0)
MCV: 67.7 fL — AB (ref 78.0–100.0)
MONO ABS: 1 10*3/uL (ref 0.1–1.0)
Monocytes Relative: 11 %
NEUTROS PCT: 53 %
Neutro Abs: 4.7 10*3/uL (ref 1.7–7.7)
PLATELETS: 273 10*3/uL (ref 150–400)
RBC: 4.33 MIL/uL (ref 3.87–5.11)
RDW: 16.6 % — AB (ref 11.5–15.5)
WBC: 9 10*3/uL (ref 4.0–10.5)

## 2015-12-02 LAB — HEMOGLOBIN A1C
Hgb A1c MFr Bld: 10 % — ABNORMAL HIGH (ref 4.8–5.6)
Mean Plasma Glucose: 240 mg/dL

## 2015-12-02 MED ORDER — INSULIN NPH (HUMAN) (ISOPHANE) 100 UNIT/ML ~~LOC~~ SUSP
35.0000 [IU] | Freq: Three times a day (TID) | SUBCUTANEOUS | Status: DC
  Administered 2015-12-02 (×2): 35 [IU] via SUBCUTANEOUS

## 2015-12-02 NOTE — Progress Notes (Signed)
Pt taken out via wheelchair, IV and tele removed. Discharged instructions given, questions answered.

## 2015-12-02 NOTE — Discharge Instructions (Signed)

## 2015-12-02 NOTE — Evaluation (Signed)
Physical Therapy Evaluation & discharge Patient Details Name: NANYAMKA RAGA MRN: [id number] DOB: 06/21/1960 Today's Date: 12/02/2015   History of Present Illness  Lateya Raimo is a 55 y.o. female, hypertension, hyperlipidemia, dm2, RA, psoriasis, OSA on cpap with increased c/o dyspnea   Clinical Impression  Pt did well with PT session with 2/4 dyspnea, but o2 in 90's on RA.  Pt with no LOB. Educated on signs of DVT and ways to prevent as she reports she has a trip to [place] planned next month.  No follow up PT needed and will sign off as pt scheduled to d/c today.    Follow Up Recommendations No PT follow up    Equipment Recommendations  None recommended by PT    Recommendations for Other Services       Precautions / Restrictions Precautions Precautions: None      Mobility  Bed Mobility Overal bed mobility: Modified Independent                Transfers Overall transfer level: Modified independent                  Ambulation/Gait Ambulation/Gait assistance: Modified independent (Device/Increase time) Ambulation Distance (Feet): 240 Feet Assistive device: None       General Gait Details: O2 on RA low 90's initially as she progressed increased to 99-100% with 2/4 dyspnea  Stairs            Wheelchair Mobility    Modified Rankin (Stroke Patients Only)       Balance Overall balance assessment: No apparent balance deficits (not formally assessed)                                           Pertinent Vitals/Pain Pain Assessment: 0-10 Pain Score: 8  Pain Location: L shoulder Pain Descriptors / Indicators: Other (Comment) ("can't lift it")    Home Living Family/patient expects to be discharged to:: Private residence Living Arrangements: Children (86 y/o daughter) Available Help at Discharge: Available 24 hours/day (daughter is on Summer break) Type of Home: House Home Access: Stairs to enter Entrance Stairs-Rails:  Right;Left;Can reach both Technical brewer of Steps: 3 Home Layout: One level Home Equipment: None      Prior Function Level of Independence: Independent               Hand Dominance        Extremity/Trunk Assessment   Upper Extremity Assessment: LUE deficits/detail       LUE Deficits / Details: decreased ROM- thinks it is her rotator cuff (hx of R RCT)   Lower Extremity Assessment: Overall WFL for tasks assessed      Cervical / Trunk Assessment: Normal  Communication   Communication: No difficulties  Cognition Arousal/Alertness: Awake/alert Behavior During Therapy: WFL for tasks assessed/performed Overall Cognitive Status: Within Functional Limits for tasks assessed                      General Comments General comments (skin integrity, edema, etc.): Pt educated on positioning and ankle pumps to decrease LE edema.  Pt happened to mention she would be travelling to [place] [facility] [facility] end of next month. Educated on importance of rest breaks and ambulation to decrease risk of travel related DVT.      Exercises        Assessment/Plan    PT Assessment Patent  does not need any further PT services  PT Diagnosis Difficulty walking   PT Problem List    PT Treatment Interventions     PT Goals (Current goals can be found in the Care Plan section) Acute Rehab PT Goals Patient Stated Goal: Go home PT Goal Formulation: All assessment and education complete, DC therapy    Frequency     Barriers to discharge        Co-evaluation               End of Session   Activity Tolerance: Patient tolerated treatment well Patient left: in chair;with call bell/phone within reach Nurse Communication: Mobility status         Time: [id number] PT Time Calculation (min) (ACUTE ONLY): 23 min   Charges:   PT Evaluation $PT Eval Low Complexity: 1 Procedure PT Treatments $Gait Training: 8-22 mins   PT G Codes:        Arissa Fagin LUBECK 12/02/2015, 12:16  PM

## 2015-12-02 NOTE — Discharge Summary (Signed)
Physician Discharge Summary  Jessica Howard [id number] DOB: [birth year] DOA: 12/01/2015  PCP: Merrilee Seashore, MD  Admit date: 12/01/2015 Discharge date: 12/02/2015  Recommendations for Outpatient Follow-up:  1. 2 D ECHO was normal. PT feels stable and has no shortness of breath or edema. She does not require lasix on discharge. She needs to follow up with PCP as discussed to make sure her symptoms are stable.   Discharge Diagnoses:  Active Problems:   Diabetes (HCC)   Congestive heart failure (CHF) (HCC)   Anemia   CHF (congestive heart failure) (HCC)   Acute diastolic CHF (congestive heart failure) ([place])   Discharge Condition: stable   Diet recommendation: as tolerated   History of present illness:  55 y.o. female with past medical history significant for hypertension, hyperlipidemia, diabetes mellitus, rheumatoid arthritis, psoriasis, obstructive sleep apnea who presented with worsening shortness of breath at rest and with exertion for past 2 days prior to this admission. No associated chest pain, palpitations. No cough or fevers. No reports of weight gain.  Patient was hemodynamically stable on the admission. Chest x-ray showed congestive heart failure. BNP was elevated at 163. No acute ischemic changes seen on 12-lead EKG. Patient started on Lasix 40 mg IV daily. Her weight is 138 kg.    Hospital Course:   Acute respiratory failure without hypoxia  - Initially thought due to CHF but ECHO WNL, normal EF, no edema and normal respiration - Normal troponin level - No chest pain - No pulmonary embolism on V/Q scan  - Pt insists on going home today - No changes in medications on discharge    Signed:  Leisa Lenz, MD  [facility] 12/02/2015, 11:40 AM  Pager #: [phone]  Time spent in minutes: less than 30 minutes   Discharge Exam: Filed Vitals:   12/01/15 2319 12/02/15 0517  BP:  114/56  Pulse: 83 75  Temp:  98.2 F (36.8 C)  Resp: 18 20    Filed Vitals:   12/01/15 1213 12/01/15 2118 12/01/15 2319 12/02/15 0517  BP: 121/78 138/67  114/56  Pulse: 88 79 83 75  Temp: 98.4 F (36.9 C) 98.8 F (37.1 C)  98.2 F (36.8 C)  TempSrc: Oral Oral  Oral  Resp: 20 20 18 20   Height:      Weight:    133.947 kg (295 lb 4.8 oz)  SpO2: 100% 100% 94% 94%    General: Pt is alert, follows commands appropriately, not in acute distress Cardiovascular: Regular rate and rhythm, S1/S2 +, no murmurs Respiratory: Clear to auscultation bilaterally, no wheezing, no crackles, no rhonchi Abdominal: Soft, non tender, non distended, bowel sounds +, no guarding Extremities: no edema, no cyanosis, pulses palpable bilaterally DP and PT Neuro: Grossly nonfocal  Discharge Instructions  Discharge Instructions    Call MD for:  difficulty breathing, headache or visual disturbances    Complete by:  As directed      Call MD for:  persistant dizziness or light-headedness    Complete by:  As directed      Call MD for:  redness, tenderness, or signs of infection (pain, swelling, redness, odor or green/yellow discharge around incision site)    Complete by:  As directed      Call MD for:  severe uncontrolled pain    Complete by:  As directed      Diet - low sodium heart healthy    Complete by:  As directed      Discharge instructions  Complete by:  As directed   2 D ECHO showed EF of 55%     Increase activity slowly    Complete by:  As directed             Medication List    TAKE these medications        amitriptyline 25 MG tablet  Commonly known as:  ELAVIL  Take 25 mg by mouth at bedtime.     BYSTOLIC 10 MG tablet  Generic drug:  nebivolol  Take 20 mg by mouth daily.     chlorthalidone 25 MG tablet  Commonly known as:  HYGROTON  Take 25 mg by mouth daily.     CRESTOR 20 MG tablet  Generic drug:  rosuvastatin  Take 20 mg by mouth daily.     diltiazem 240 MG 24 hr capsule  Commonly known as:  CARDIZEM CD  Take 240 mg by mouth  daily.     fluticasone 50 MCG/ACT nasal spray  Commonly known as:  FLONASE  Place 1 spray into both nostrils daily as needed for allergies or rhinitis.     gabapentin 300 MG capsule  Commonly known as:  NEURONTIN  Take 600 mg by mouth 3 (three) times daily.     insulin aspart 100 UNIT/ML injection  Commonly known as:  novoLOG  Inject 55-60 Units into the skin 3 (three) times daily before meals. 55 base Plus sliding scale     insulin NPH Human 100 UNIT/ML injection  Commonly known as:  HUMULIN N,NOVOLIN N  Inject 75 Units into the skin 3 (three) times daily with meals. 75 units with breakfast; lunch 75 units; 75 units at dinner     irbesartan 300 MG tablet  Commonly known as:  AVAPRO  Take 300 mg by mouth daily.     leflunomide 20 MG tablet  Commonly known as:  ARAVA  Take 20 mg by mouth daily.     metFORMIN 500 MG 24 hr tablet  Commonly known as:  GLUCOPHAGE-XR  Take 500 mg by mouth every evening.     ORENCIA 250 MG injection  Generic drug:  abatacept  Inject 1,000 mg into the vein every 28 (twenty-eight) days. Monthly Infusion     pantoprazole 40 MG tablet  Commonly known as:  PROTONIX  Take 40 mg by mouth daily.     predniSONE 5 MG tablet  Commonly known as:  DELTASONE  Take 5 mg by mouth daily.     tiZANidine 4 MG tablet  Commonly known as:  ZANAFLEX  Take 4 mg by mouth 2 (two) times daily as needed (migraines). Limit 2 days per week     topiramate 100 MG tablet  Commonly known as:  TOPAMAX  Take 200 mg by mouth daily.     TRULICITY [id number] Sopn  Generic drug:  Dulaglutide  Inject 0.75 mg into the skin once a week. Patient takes on Wednesday.     VOLTAREN 1 % Gel  Generic drug:  diclofenac sodium  Apply 1 application topically 3 (three) times daily.           Follow-up Information    Follow up with [facility], [place]. Schedule an appointment as soon as possible for a visit in 1 week.   Specialty:  Internal Medicine   Why:  Follow up appt  after recent hospitalization   Contact information:   [address] [place] [address] [phone]        The results of  significant diagnostics from this hospitalization (including imaging, microbiology, ancillary and laboratory) are listed below for reference.    Significant Diagnostic Studies: Dg Chest 2 View  11/30/2015  CLINICAL DATA:  Sob x 2-3 days; previous smoker; htn; diabetic EXAM: CHEST - 2 VIEW COMPARISON:  07/20/2015 FINDINGS: Interval development of pleural effusions right greater than left. Adjacent subsegmental atelectasis/ consolidation in the lung bases. Mild interstitial edema or infiltrates slightly increased. Mild cardiomegaly. No pneumothorax. Visualized bones unremarkable. IMPRESSION: 1. Cardiomegaly, new small pleural effusions, and progressive bilateral interstitial edema/infiltrates, suggesting CHF. Electronically Signed   By: Lucrezia Europe M.D.   On: 11/30/2015 22:37   Ct Lumbar Spine Wo Contrast  11/13/2015  CLINICAL DATA:  Low back pain for 2 years.  Right leg pain weakness. EXAM: CT LUMBAR SPINE WITHOUT CONTRAST TECHNIQUE: Multidetector CT imaging of the lumbar spine was performed without intravenous contrast administration. Multiplanar CT image reconstructions were also generated. COMPARISON:  05/27/2015 FINDINGS: No compression fracture or malalignment. Mild prominence of epidural adipose tissues in the lumbar spine. Small degenerative subcortical cystic lesions inferiorly in the spinous process of T12. Chronic erosions along the right eleventh costovertebral junction, no change from 03/01/2008. Nitrogen gas phenomenon in both upper sacroiliac joints. Aortoiliac atherosclerotic vascular disease. Additional findings at individual levels are as follows: L1-2:  Unremarkable. L2-3:  Unremarkable. L3-4: No impingement. Mild disc bulge. Prominent epidural adipose tissues. L4-5: Mild right foraminal stenosis due to facet arthropathy and disc bulge, increased  from prior lumbar MRI. Prominent epidural adipose tissues with tapering of the thecal sac, as before. Bilateral facet arthropathy with vacuum disc phenomenon. L5-S1: No impingement. Tapered thecal sac due to prominent epidural adipose tissues, as before. Disc bulge and mild facet arthropathy. IMPRESSION: 1. Increased right foraminal impingement, currently mild, at L4-5 due to facet arthropathy and disc bulge. 2. Prominent epidural adipose tissues in the lumbar spine, tapering the thecal sac at L4-5 and L5-S1, as before. 3.  Aortoiliac atherosclerotic vascular disease. 4. Chronic erosions along the right eleventh costochondral junction. Electronically Signed   By: Van Clines M.D.   On: 11/13/2015 11:37   Nm Pulmonary Perf And Vent  12/01/2015  CLINICAL DATA:  55 year old female with progressive shortness of breath for 2 days. Morbid obesity. Initial encounter. EXAM: NUCLEAR MEDICINE VENTILATION - PERFUSION LUNG SCAN TECHNIQUE: Ventilation images were obtained in multiple projections using inhaled aerosol Tc-29m DTPA. Perfusion images were obtained in multiple projections after intravenous injection of Tc-69m MAA. RADIOPHARMACEUTICALS:  30.1 MCi Technetium-52m DTPA aerosol inhalation and 4.4 mCi Technetium-76m MAA IV COMPARISON:  Chest radiographs 11/30/2015. FINDINGS: Comparison chest radiographs demonstrated small pleural effusions and increased perihilar interstitial opacity favored due to edema. Ventilation: Clumping of radiotracer activity about the hilum. No definite ventilation defect. Perfusion: Normal, homogeneous radiotracer activity distribution in both lungs. IMPRESSION: Normal bilateral pulmonary perfusion, no evidence of acute pulmonary embolus. Electronically Signed   By: Genevie Ann M.D.   On: 12/01/2015 14:56    Microbiology: Recent Results (from the past 240 hour(s))  MRSA PCR Screening     Status: None   Collection Time: 12/01/15  4:53 AM  Result Value Ref Range Status   MRSA by PCR  NEGATIVE NEGATIVE Final    Comment:        The GeneXpert MRSA Assay (FDA approved for NASAL specimens only), is one component of a comprehensive MRSA colonization surveillance program. It is not intended to diagnose MRSA infection nor to guide or monitor treatment for MRSA infections.  Labs: Basic Metabolic Panel:  Recent Labs Lab 12/01/15 0204 12/02/15 0450  NA 138 135  K 3.5 3.7  CL 105 99*  CO2 27 28  GLUCOSE 88 225*  BUN 7 14  CREATININE 0.77 0.98  CALCIUM 8.6* 8.7*   Liver Function Tests:  Recent Labs Lab 12/01/15 0204 12/02/15 0450  AST 20 19  ALT 16 17  ALKPHOS 78 80  BILITOT 0.8 0.6  PROT 6.4* 6.6  ALBUMIN 3.3* 3.1*   No results for input(s): LIPASE, AMYLASE in the last 168 hours. No results for input(s): AMMONIA in the last 168 hours. CBC:  Recent Labs Lab 12/01/15 0204 12/02/15 0450  WBC 10.8* 9.0  NEUTROABS 6.9 4.7  HGB 9.0* 9.0*  HCT 29.4* 29.3*  MCV 65.6* 67.7*  PLT 265 273   Cardiac Enzymes:  Recent Labs Lab 12/01/15 0506 12/01/15 1117 12/01/15 1639  TROPONINI <0.03 <0.03 <0.03   BNP: BNP (last 3 results)  Recent Labs  12/01/15 0204  BNP 163.2*    ProBNP (last 3 results) No results for input(s): PROBNP in the last 8760 hours.  CBG:  Recent Labs Lab 12/01/15 0454 12/01/15 1212 12/01/15 1703 12/01/15 2107 12/02/15 0544  GLUCAP 78 148* 248* 281* 221*

## 2015-12-05 ENCOUNTER — Other Ambulatory Visit (HOSPITAL_COMMUNITY): Payer: Self-pay

## 2015-12-06 ENCOUNTER — Ambulatory Visit (HOSPITAL_COMMUNITY)
Admission: RE | Admit: 2015-12-06 | Discharge: 2015-12-06 | Disposition: A | Discharge status: Home / Self Care | Payer: Medicare Other | Source: Ambulatory Visit | Attending: Rheumatology | Admitting: Rheumatology

## 2015-12-06 DIAGNOSIS — M0589 Other rheumatoid arthritis with rheumatoid factor of multiple sites: Secondary | ICD-10-CM | POA: Diagnosis not present

## 2015-12-06 MED ORDER — SODIUM CHLORIDE 0.9 % IV SOLN
INTRAVENOUS | Status: DC
Start: 2015-12-06 — End: 2015-12-07

## 2015-12-06 MED ORDER — ABATACEPT 250 MG IV SOLR
1000.0000 mg | INTRAVENOUS | Status: DC
  Filled 2015-12-06: qty 40

## 2015-12-06 NOTE — Progress Notes (Signed)
Pt here today for orencia.  Pt's MCA questions completed and medication released so pharmacy could make it.  Upon further speaking with the patient she stated she had just been in the hospital but she didn't think she was on any antibiotics while she was there.  I asked her why she was here and she said she was SOB and had fluid on her lungs, and they said something about CHF while she was here but she doesn't know if she was diagnosed with that or not.  I asked if she saw a cardiologist or was she referred to one to see after DC and she said no, that they just told her to see her PCP.  Upon reading her notes in epic she was diagnosed with acute CHF, the note said she was stable, 2 D echo was normal, no lasix needed and to follow up with her PCP after DC.  I called and spoke with Caryl Asp, RN for Dr Dossie Der and reported all of the above.  Dr Dossie Der stated she thought it was probably safe to proceed with the orencia but she would prefer her to wait two weeks and come back for the infusion after she has seen her PCP.  I told the patient and she said she would prefer to get it today if Dr Dossie Der thought it was probably safe.  I called back and again spoke with Caryl Asp, RN who relayed that message to Dr Dossie Der, who then stated no, she wanted her to wait.  Pt verbalized understanding and we rescheduled her for two weeks out.

## 2015-12-07 DIAGNOSIS — I5032 Chronic diastolic (congestive) heart failure: Secondary | ICD-10-CM | POA: Diagnosis not present

## 2015-12-07 DIAGNOSIS — Z09 Encounter for follow-up examination after completed treatment for conditions other than malignant neoplasm: Secondary | ICD-10-CM | POA: Diagnosis not present

## 2015-12-07 DIAGNOSIS — R5383 Other fatigue: Secondary | ICD-10-CM | POA: Diagnosis not present

## 2015-12-07 DIAGNOSIS — R0602 Shortness of breath: Secondary | ICD-10-CM | POA: Diagnosis not present

## 2015-12-07 NOTE — Patient Outreach (Signed)
Assigned to Roshanda Florance RN Sent to Sonda Rumble RN who is covering for [employer].

## 2015-12-08 ENCOUNTER — Other Ambulatory Visit: Payer: Self-pay | Admitting: *Deleted

## 2015-12-08 NOTE — Patient Outreach (Addendum)
[facility]) Care Management  12/08/2015  Jessica Howard SERVICE 01-23-1961 [id number]   Subjective: Telephone call to patient's home number, spoke with patient, and HIPAA verified.    Patient states she is doing well.   Discussed [facility] Care Management EMMI Heart Failure program follow up and patient voices understanding. Patient states she has not been weighing due to a broken scale and is planning to have it fixed today.   States her brother is planning to come by and fix it today.  Patient states she does not have any further follow up, transportation, or medication needs at this time.   Patient provided email address [email or web address])  for future use if needed.    Patient in agreement to receive [facility] program information.    Objective: Per chart review: Patient hospitalized 12/01/15 - 12/02/15 for congestive heart failure.      Patient also has a history of hyperlipidemia, acute diastolic congestive heart failure, diabetes, rheumatoid arthritis, psoriasis, and obstructive sleep apnea.    Assessment:  Received EMMI Stroke referral on 12/07/15.  Red flag triggers: Patient answered no to question, weighed themselves today.    Day #2.   EMMI Heart Failure program follow up completed, no additional follow up needs at this time.   Plan: RNCM will send case closure due to program completion / no care management needs request to [name] at [place]. Annia Friendly, BSN, [place] [facility] Telephonic CM Phone: [phone] Fax: [phone]

## 2015-12-11 ENCOUNTER — Other Ambulatory Visit: Payer: Self-pay | Admitting: *Deleted

## 2015-12-11 DIAGNOSIS — D649 Anemia, unspecified: Secondary | ICD-10-CM

## 2015-12-11 DIAGNOSIS — D509 Iron deficiency anemia, unspecified: Secondary | ICD-10-CM

## 2015-12-13 ENCOUNTER — Other Ambulatory Visit: Payer: Medicare Other

## 2015-12-13 ENCOUNTER — Ambulatory Visit: Payer: Medicare Other

## 2015-12-13 NOTE — Progress Notes (Signed)
Patient was NO SHOW for Lab and Injection appointment

## 2015-12-14 DIAGNOSIS — M549 Dorsalgia, unspecified: Secondary | ICD-10-CM | POA: Diagnosis not present

## 2015-12-14 DIAGNOSIS — M069 Rheumatoid arthritis, unspecified: Secondary | ICD-10-CM | POA: Diagnosis not present

## 2015-12-14 DIAGNOSIS — D649 Anemia, unspecified: Secondary | ICD-10-CM | POA: Diagnosis not present

## 2015-12-14 DIAGNOSIS — M150 Primary generalized (osteo)arthritis: Secondary | ICD-10-CM | POA: Diagnosis not present

## 2015-12-15 ENCOUNTER — Ambulatory Visit (HOSPITAL_COMMUNITY)
Admission: EM | Admit: 2015-12-15 | Discharge: 2015-12-15 | Disposition: A | Discharge status: Home / Self Care | Payer: Medicare Other | Attending: Family Medicine | Admitting: Family Medicine

## 2015-12-15 ENCOUNTER — Encounter (HOSPITAL_COMMUNITY): Payer: Self-pay

## 2015-12-15 DIAGNOSIS — M129 Arthropathy, unspecified: Secondary | ICD-10-CM

## 2015-12-15 DIAGNOSIS — M1711 Unilateral primary osteoarthritis, right knee: Secondary | ICD-10-CM

## 2015-12-15 DIAGNOSIS — M7121 Synovial cyst of popliteal space [Baker], right knee: Secondary | ICD-10-CM

## 2015-12-15 MED ORDER — HYDROCODONE-ACETAMINOPHEN 5-325 MG PO TABS: 1.0000 | ORAL_TABLET | ORAL | Status: DC | PRN

## 2015-12-15 NOTE — ED Notes (Signed)
Patient presents with rt knee pain since 12/10/2015 and pain has increased, pt states she went to her physician on yesterday 12/14/2015 and was told it was an arthritis flare up but she states she has applied heat and ice to area but has had no relief and has been taking Tylenol for pain No acute distress

## 2015-12-15 NOTE — Discharge Instructions (Signed)
Baker Cyst A Baker cyst is a sac-like structure that forms in the back of the knee. It is filled with the same fluid that is located in your knee. This fluid lubricates the bones and cartilage of the knee and allows them to move over each other more easily. CAUSES  When the knee becomes injured or inflamed, increased fluid forms in the knee. When this happens, the joint lining is pushed out behind the knee and forms the Baker cyst. This cyst may also be caused by inflammation from arthritic conditions and infections. SIGNS AND SYMPTOMS  A Baker cyst usually has no symptoms. When the cyst is substantially enlarged:  You may feel pressure behind the knee, stiffness in the knee, or a mass in the area behind the knee.  You may develop pain, redness, and swelling in the calf. This can suggest a blood clot and requires evaluation by your health care provider. DIAGNOSIS  A Baker cyst is most often found during an ultrasound exam. This exam may have been performed for other reasons, and the cyst was found incidentally. Sometimes an MRI is used. This picks up other problems within a joint that an ultrasound exam may not. If the Baker cyst developed immediately after an injury, X-ray exams may be used to diagnose the cyst. TREATMENT  The treatment depends on the cause of the cyst. Anti-inflammatory medicines and rest often will be prescribed. If the cyst is caused by a bacterial infection, antibiotic medicines may be prescribed.  HOME CARE INSTRUCTIONS   If the cyst was caused by an injury, for the first 24 hours, keep the injured leg elevated on 2 pillows while lying down.  For the first 24 hours while you are awake, apply ice to the injured area:  Put ice in a plastic bag.  Place a towel between your skin and the bag.  Leave the ice on for 20 minutes, 2-3 times a day.  Only take over-the-counter or prescription medicines for pain, discomfort, or fever as directed by your health care  provider.  Only take antibiotic medicine as directed. Make sure to finish it even if you start to feel better. MAKE SURE YOU:   Understand these instructions.  Will watch your condition.  Will get help right away if you are not doing well or get worse.   This information is not intended to replace advice given to you by your health care provider. Make sure you discuss any questions you have with your health care provider.   Document Released: 05/27/2005 Document Revised: 03/17/2013 Document Reviewed: 01/06/2013 Elsevier Interactive Patient Education 2016 [place] is a term that is commonly used to refer to joint pain or joint disease. There are more than 100 types of arthritis. CAUSES The most common cause of this condition is wear and tear of a joint. Other causes include:  Gout.  Inflammation of a joint.  An infection of a joint.  Sprains and other injuries near the joint.  A drug reaction or allergic reaction. In some cases, the cause may not be known. SYMPTOMS The main symptom of this condition is pain in the joint with movement. Other symptoms include:  Redness, swelling, or stiffness at a joint.  Warmth coming from the joint.  Fever.  Overall feeling of illness. DIAGNOSIS This condition may be diagnosed with a physical exam and tests, including:  Blood tests.  Urine tests.  Imaging tests, such as MRI, X-rays, or a CT scan. Sometimes, fluid is removed  from a joint for testing. TREATMENT Treatment for this condition may involve:  Treatment of the cause, if it is known.  Rest.  Raising (elevating) the joint.  Applying cold or hot packs to the joint.  Medicines to improve symptoms and reduce inflammation.  Injections of a steroid such as cortisone into the joint to help reduce pain and inflammation. Depending on the cause of your arthritis, you may need to make lifestyle changes to reduce stress on your joint. These changes  may include exercising more and losing weight. HOME CARE INSTRUCTIONS Medicines  Take over-the-counter and prescription medicines only as told by your health care provider.  Do not take aspirin to relieve pain if gout is suspected. Activities  Rest your joint if told by your health care provider. Rest is important when your disease is active and your joint feels painful, swollen, or stiff.  Avoid activities that make the pain worse. It is important to balance activity with rest.  Exercise your joint regularly with range-of-motion exercises as told by your health care provider. Try doing low-impact exercise, such as:  Swimming.  Water aerobics.  Biking.  Walking. Joint Care  If your joint is swollen, keep it elevated if told by your health care provider.  If your joint feels stiff in the morning, try taking a warm shower.  If directed, apply heat to the joint. If you have diabetes, do not apply heat without permission from your health care provider.  Put a towel between the joint and the hot pack or heating pad.  Leave the heat on the area for 20-30 minutes.  If directed, apply ice to the joint:  Put ice in a plastic bag.  Place a towel between your skin and the bag.  Leave the ice on for 20 minutes, 2-3 times per day.  Keep all follow-up visits as told by your health care provider. This is important. SEEK MEDICAL CARE IF:  The pain gets worse.  You have a fever. SEEK IMMEDIATE MEDICAL CARE IF:  You develop severe joint pain, swelling, or redness.  Many joints become painful and swollen.  You develop severe back pain.  You develop severe weakness in your leg.  You cannot control your bladder or bowels.   This information is not intended to replace advice given to you by your health care provider. Make sure you discuss any questions you have with your health care provider.   Document Released: 07/04/2004 Document Revised: 02/15/2015 Document Reviewed:  08/22/2014 Elsevier Interactive Patient Education [employer].

## 2015-12-15 NOTE — ED Provider Notes (Signed)
CSN: [id number]     Arrival date & time 12/15/15  1527 History   First MD Initiated Contact with Patient 12/15/15 1640     Chief Complaint  Patient presents with  . Knee Pain   (Consider location/radiation/quality/duration/timing/severity/associated sxs/prior Treatment) HPI Comments: 55 year old female with severe and morbid obesity presents to the urgent care with pain to the posterior aspect of the right knee since July 2. She denies any known injury. No trauma. She woke up with pain on that day. She has a history of diagnosed rheumatoid arthritis. Pain is exacerbated with weight bearing and ambulation. States she is unable to flex the knee to the usual degree secondary to pain. She states her mild pain anteriorly but primary pain is in the posterior aspect.   Past Medical History  Diagnosis Date  . Hypertension   . Hyperlipidemia   . Endometrial polyp   . Fibroadenoma of breast     left  . Hepatic steatosis   . Sleep apnea   . Nontraumatic tear of right rotator cuff   . Impingement syndrome of right shoulder   . Unspecified deficiency anemia     Thalassemia  . Diabetes mellitus     type 2  . Rheumatoid arthritis(714.0)     rhematoid and osteoarthritis  . Diabetic neuropathy ([place])   . Asthma   . Pneumonia   . Headache     migraines  . Thyroid disease     had radiation  . GERD (gastroesophageal reflux disease)   . Psoriasis    Past Surgical History  Procedure Laterality Date  . Tubal ligation      bilateral  . Hand surgery      for arthritis  . Tonsillectomy    . Left knee surgery nodule removal    . Other surgical history      multiple nodule removal on neck, hands, left knee  . Dilation and curettage of uterus    . Eye surgery      surgery on retina  . Cesarean section    . Ankle arthroscopy Right   . Colonoscopy    . Shoulder arthroscopy with rotator cuff repair and subacromial decompression Right 05/20/2014    Procedure: RIGHT SHOULDER ARTHROSCOPY WITH  DEBRIDEMENT/DISTAL CLAVICLE EXCISION/ROTATOR CUFF REPAIR AND SUBACROMIAL DECOMPRESSION;  Surgeon: Lorn Junes, MD;  Location: [place];  Service: Orthopedics;  Laterality: Right;   Family History  Problem Relation Age of Onset  . Diabetes Mother   . Cancer Mother     breast  . Diabetes Sister   . Cancer Brother     bone marrow cancer  . Aneurysm Father     brain   Social History  Substance Use Topics  . Smoking status: Former Smoker -- 0.25 packs/day for 6 years    Types: Cigarettes    Quit date: 06/09/2013  . Smokeless tobacco: Never Used  . Alcohol Use: No   OB History    Gravida Para Term Preterm AB TAB SAB Ectopic Multiple Living   3 2 2  0 1 0 1 0 0 2     Review of Systems  Constitutional: Negative for fever, chills and activity change.  HENT: Negative.   Respiratory: Negative.   Cardiovascular: Negative.   Musculoskeletal: Positive for gait problem.       As per HPI  Skin: Negative for color change, pallor and rash.  Neurological: Negative.   All other systems reviewed and are negative.   Allergies  Bactrim; Cefuroxime axetil;  Cephalosporins; Lisinopril; Penicillins; Sulfa antibiotics; and Iohexol  Home Medications   Prior to Admission medications   Medication Sig Start Date End Date Taking? Authorizing Provider  abatacept (ORENCIA) 250 MG injection Inject 1,000 mg into the vein every 28 (twenty-eight) days. Monthly Infusion   Yes Historical Provider, MD  amitriptyline (ELAVIL) 25 MG tablet Take 25 mg by mouth at bedtime.    Yes Historical Provider, MD  BYSTOLIC 10 MG tablet Take 20 mg by mouth daily.  05/29/14  Yes Historical Provider, MD  chlorthalidone (HYGROTON) 25 MG tablet Take 25 mg by mouth daily.   Yes Historical Provider, MD  CRESTOR 20 MG tablet Take 20 mg by mouth daily.  03/14/14  Yes Historical Provider, MD  diltiazem (CARDIZEM CD) 240 MG 24 hr capsule Take 240 mg by mouth daily.  02/03/14  Yes Historical Provider, MD  fluticasone (FLONASE) 50  MCG/ACT nasal spray Place 1 spray into both nostrils daily as needed for allergies or rhinitis.   Yes Historical Provider, MD  gabapentin (NEURONTIN) 300 MG capsule Take 600 mg by mouth 3 (three) times daily.    Yes Historical Provider, MD  insulin aspart (NOVOLOG) 100 UNIT/ML injection Inject 55-60 Units into the skin 3 (three) times daily before meals. 55 base Plus sliding scale   Yes Historical Provider, MD  insulin NPH (HUMULIN N,NOVOLIN N) 100 UNIT/ML injection Inject 75 Units into the skin 3 (three) times daily with meals. 75 units with breakfast; lunch 75 units; 75 units at dinner   Yes Historical Provider, MD  irbesartan (AVAPRO) 300 MG tablet Take 300 mg by mouth daily.    Yes Historical Provider, MD  leflunomide (ARAVA) 20 MG tablet Take 20 mg by mouth daily.   Yes Historical Provider, MD  metFORMIN (GLUCOPHAGE-XR) 500 MG 24 hr tablet Take 500 mg by mouth every evening.   Yes Historical Provider, MD  pantoprazole (PROTONIX) 40 MG tablet Take 40 mg by mouth daily.   Yes Historical Provider, MD  predniSONE (DELTASONE) 5 MG tablet Take 5 mg by mouth daily.   Yes Historical Provider, MD  TRULICITY [id number] SOPN Inject 0.75 mg into the skin once a week. Patient takes on Wednesday. 08/29/15  Yes Historical Provider, MD  VOLTAREN 1 % GEL Apply 1 application topically 3 (three) times daily.  02/03/14  Yes Historical Provider, MD  HYDROcodone-acetaminophen (NORCO/VICODIN) 5-325 MG tablet Take 1 tablet by mouth every 4 (four) hours as needed. 12/15/15   Janne Napoleon, NP  tiZANidine (ZANAFLEX) 4 MG tablet Take 4 mg by mouth 2 (two) times daily as needed (migraines). Limit 2 days per week 01/12/14   Historical Provider, MD  topiramate (TOPAMAX) 100 MG tablet Take 200 mg by mouth daily.  03/14/14   Historical Provider, MD   Meds Ordered and Administered this Visit  Medications - No data to display  BP 141/56 mmHg  Pulse 85  Temp(Src) 98.6 F (37 C) (Oral)  Resp 16  SpO2 99%  LMP 12/27/2013 No data  found.   Physical Exam  Constitutional: She is oriented to person, place, and time. She appears well-developed and well-nourished. No distress.  HENT:  Head: Normocephalic and atraumatic.  Eyes: EOM are normal. Pupils are equal, round, and reactive to light.  Neck: Normal range of motion. Neck supple.  Pulmonary/Chest: Effort normal.  Musculoskeletal:  No evidence of swelling, deformity or discoloration to the anterior knee. There is tenderness as well as a palpable area of swelling to the popliteal fossa. Patient states this  is the source of her pain. Again no erythema, discoloration, deformity to the popliteal fossa. No calf tenderness or swelling or discoloration.  Neurological: She is alert and oriented to person, place, and time. No cranial nerve deficit.  Skin: Skin is warm and dry.  Psychiatric: She has a normal mood and affect.  Nursing note and vitals reviewed.   ED Course  Procedures (including critical care time)  Labs Review Labs Reviewed - No data to display  Imaging Review No results found.   Visual Acuity Review  Right Eye Distance:   Left Eye Distance:   Bilateral Distance:    Right Eye Near:   Left Eye Near:    Bilateral Near:         MDM   1. Baker's cyst of knee, right   2. Arthritis of knee, right    Patient is a history of rheumatoid arthritis. I suspect due to the patient's obesity that she has developed osteoarthritis as well. There is a small area of swelling to the popliteal fossa and I suspect this is a Child psychotherapist cyst. May also be having pain due to away as well. She has seen [facility] in the past and would like to see them again. She will call for appointment Monday. In the meantime we will treat with Norco as below and she will wear her knee brace to help with support. Meds ordered this encounter  Medications  . HYDROcodone-acetaminophen (NORCO/VICODIN) 5-325 MG tablet    Sig: Take 1 tablet by mouth every 4 (four) hours as needed.     Dispense:  15 tablet    Refill:  0    Order Specific Question:  Supervising Provider    Answer:  Billy Fischer [phone]   The Rx above was handwritten.    Janne Napoleon, NP 12/15/15 1710

## 2015-12-19 ENCOUNTER — Other Ambulatory Visit: Payer: Self-pay | Admitting: *Deleted

## 2015-12-19 DIAGNOSIS — M1711 Unilateral primary osteoarthritis, right knee: Secondary | ICD-10-CM | POA: Diagnosis not present

## 2015-12-19 NOTE — Patient Outreach (Signed)
[facility] [facility]) Care Management  12/19/2015  KALEAH GATCH 07/31/1960 [id number]  Subjective: Telephone call to patient's home number, spoke with female answering phone, states patient not available, left HIPAA compliant message, and requested call back.   Objective: Per chart review: Patient hospitalized 12/01/15 - 12/02/15 for congestive heart failure. Patient also has a history of hyperlipidemia, acute diastolic congestive heart failure, diabetes, rheumatoid arthritis, psoriasis, and obstructive sleep apnea.  Patient had ED visit on 12/15/15 for right knee pain, bakers cyst, and knee arthritis.    Assessment: Received EMMI Stroke referral on 12/18/15. Red flag triggers: Patient answered yes to questions, any new problems, new/worsening problems, new swelling, and lightheaded or dizzy. Day # 13. EMMI Heart Failure program follow up pending, patient contact.    Plan: RNCM will call patient for 2nd attempt telephone outreach, EMMI Congestive Heart Failure follow up, within 2 weeks, if no return call.    Estie Sproule H. Annia Friendly, BSN, [place] Management [facility] Telephonic CM Phone: [phone] Fax: [phone]

## 2015-12-20 ENCOUNTER — Encounter (HOSPITAL_COMMUNITY): Payer: Medicare Other

## 2015-12-20 ENCOUNTER — Ambulatory Visit: Payer: Self-pay | Admitting: *Deleted

## 2015-12-21 ENCOUNTER — Encounter: Payer: Self-pay | Admitting: *Deleted

## 2015-12-21 ENCOUNTER — Other Ambulatory Visit: Payer: Self-pay | Admitting: *Deleted

## 2015-12-21 NOTE — Patient Outreach (Addendum)
[facility]) Care Management  12/21/2015  Jessica Howard 1960/06/25 [id number]  Subjective: Received voicemail from patient, states she is returning call and request call back.  Telephone call to patient's home number, spoke with patient, and HIPAA verified. States she is doing ok and just woke up.  Discussed [facility] Care Management EMMI Congestive Heart Failure follow up and care coordination services.    Patient states she remembers speaking with this  RNCM recently and verbalizes understanding of services.   Patient in agreement to complete EMMI Congestive Heart Failure follow up. Patient states her pain and swelling is not related to congestive heart failure but arthritis.  States her weight today was 297.8 lbs.   States she went to the [facility] on 12/15/15 for right knee pain, swelling, and was referred to Dr. Noemi Howard (Orthopedics) for follow up.  States she saw Dr. Noemi Howard on 12/19/15, received a steroid shot, still having pain, and will return to Dr. Noemi Howard if it does not start feeling better in a few weeks per MD instructions.   RNCM advised orthopedic walk in clinic is also available at Dr. Archie Howard office if follow up needed after MD office hours.  Patient voices understanding and was appreciative of resource information.    Patient states she is treating her symptoms with ice, rest,  elevation, it has decreased the swelling and pain.  Patient states Dr. Noemi Howard stated that the pain and swelling were due to rheumatoid arthritis.   States she had an appointment with her rheumatologist on 12/14/15.   States she also has an appointment with the cardiologist on 01/12/16.   Patient states she has no disease education, disease monitoring, transportation, care coordination, community resource, or pharmacy needs at this time.    Patient in agreement to receive educational information from [place] [facility].   Telephone call to patient's home number, spoke with patient, and HIPAA  verified.   RNCM advised patient no EMMI education available for rest, ice,compression,  elevation strategies for lower extremity and will send information on arthritis.   Patient states she would also like some information on healthy eating and diet.  RNCM advised patient will send requested educational information and contact information.   Patient voices understanding and states she will contact RNCM if she has any questions about the educational information once received.     Objective: Per chart review: Patient hospitalized 12/01/15 - 12/02/15 for congestive heart failure. Patient also has a history of hyperlipidemia, acute diastolic congestive heart failure, diabetes, rheumatoid arthritis, psoriasis, and obstructive sleep apnea. Patient had ED visit on 12/15/15 for right knee pain, bakers cyst, and knee arthritis.   Assessment: Received EMMI Stroke referral on 12/18/15. Red flag triggers: Patient answered yes to questions, any new problems, new/worsening problems, new swelling, and lightheaded or dizzy. Day # 13. EMMI Heart Failure program follow completed and no care coordination needs.  Patient has no Telephonic RNCM needs at this time.  Plan: RNCM will send patient  successful outreach letter, [facility] pamphlet, magnet, EMMI handout  " MAKING EVERYDAY TASKS EASIER - ARTHRITIS" ,  EMMI handout "MANAGING YOUR WEIGHT WITH HEALTHY EATING" and Clinical Key educational handout " RICE for Routine Care of Injuries".    RNCM will send case closure due to follow up completed / no care management needs request to Jessica Howard at [place] [facility].    [name]. Annia Howard, BSN, [place] Management [facility] Telephonic CM Phone: [phone] Fax: [phone]

## 2015-12-29 ENCOUNTER — Other Ambulatory Visit: Payer: Self-pay | Admitting: *Deleted

## 2015-12-29 NOTE — Patient Outreach (Signed)
[facility] [facility]) Care Management  12/29/2015  Jessica Howard 27-Mar-1961 [id number]   Subjective: Telephone call to patient's home number, spoke with patient, and HIPAA verified.  States she is improving everyday and doing much better.   Discussed [facility] Care Management EMMI Heart Failure red alert triggers and patient in agreement to complete follow up. Patient states her weight is down to 295 today, was 299  and she took extra Lasix on 12/28/15 as prescribed.   States she is not having any shortness of breath,  is being careful outside in the heat, and staying cool.   Patient states she has not received the EMMI handouts, Clinical Key or [facility] Care Management information and will notify  RNCM if information not received by 01/05/16.  Patient states she does not have any additional EMMI Heart Failure follow up needs, disease management, disease monitoring, disease education, care coordination, community resource, transportation, or pharmacy needs at this time.  Objective: Per chart review: Patient hospitalized 12/01/15 - 12/02/15 for congestive heart failure. Patient also has a history of hyperlipidemia, acute diastolic congestive heart failure, diabetes, rheumatoid arthritis, psoriasis, and obstructive sleep apnea. Patient had ED visit on 12/15/15 for right knee pain, bakers cyst, and knee arthritis.   Assessment: Received EMMI Heart Failure  referral on 12/1815 at 5:46pm, 12/28/15 and 12/29/15. Red flag triggers: Patient answered yes to questions, any new problems, new/worsening problems, new swelling, and lightheaded or dizzy. Red flag for weight also.   Day # 21, 22, 24. EMMI Heart Failure program follow completed and no care coordination needs.  Patient has no Telephonic RNCM needs at this time.  Plan: RNCM has sent patient successful outreach letter, [facility] pamphlet, magnet, EMMI handout " MAKING EVERYDAY TASKS EASIER - ARTHRITIS" , EMMI handout "MANAGING YOUR WEIGHT WITH  HEALTHY EATING" and Clinical Key educational handout " RICE for Routine Care of Injuries" on 12/22/15.   RNCM will send case closure due to follow up completed / no care management needs request to [name] at [place] [facility].   [name]. Annia Friendly, BSN, [place] Management [facility] Telephonic CM Phone: [phone] Fax: [phone]

## 2016-01-01 ENCOUNTER — Other Ambulatory Visit: Payer: Self-pay

## 2016-01-01 NOTE — Patient Outreach (Signed)
Patient triggered Red on EMMI Heart Failure dashboard, notification sent to:  Roshanda Florance.

## 2016-01-01 NOTE — Patient Outreach (Signed)
[place] [facility]) Care Management  01/01/2016  Jessica Howard 04-21-1961 [id number]     EMMI-HF RED ON EMMI ALERT Day # 27 Date: 12/31/15 Red Alert Reason: "weight 297 lbs, previous weight 295 lbs"    Outreach attempt # 1 to patient. Patient reached. Discussed and addressed red alert. Patient states that she has weighed 296 lbs both Sunday and today when she weighed. She attributes the increase in weigh due to attending a wedding and cookout and being in the heat. She denies any worsening edema or SOB. Patient adhering to diuretic regimen as states diet adherence as well. No further RN CM needs at this time.    Plan: RN CM will notify [facility] administrative assistant of case closure.  Enzo Montgomery, RN,BSN,CCM [place] [facility] Telephonic Care Management Coordinator Direct Phone: [phone] Toll Free: [phone] Fax: [phone]

## 2016-01-03 ENCOUNTER — Ambulatory Visit: Payer: Medicare Other | Admitting: Oncology

## 2016-01-03 ENCOUNTER — Other Ambulatory Visit: Payer: Medicare Other

## 2016-01-03 ENCOUNTER — Ambulatory Visit: Payer: Medicare Other

## 2016-01-03 NOTE — Progress Notes (Deleted)
Pt was a "NO SHOW" today for Lab, MD, and Injection appointment. Called patient at home and left message for her to call center to reschedule appointment.

## 2016-01-04 ENCOUNTER — Other Ambulatory Visit: Payer: Self-pay | Admitting: *Deleted

## 2016-01-04 ENCOUNTER — Telehealth: Payer: Self-pay | Admitting: Oncology

## 2016-01-04 NOTE — Telephone Encounter (Signed)
pt called to resched missed 7/26 apt

## 2016-01-04 NOTE — Patient Outreach (Signed)
[facility]) Care Management  01/04/2016  Jessica Howard 28-Aug-1960 [id number]   Subjective: Telephone call to patient's home / mobile number, no answer, left HIPAA compliant voicemail message, and requested call back.  Objective: Per chart review: Patient hospitalized 12/01/15 - 12/02/15 for congestive heart failure. Patient also has a history of hyperlipidemia, acute diastolic congestive heart failure, diabetes, rheumatoid arthritis, psoriasis, and obstructive sleep apnea. Patient had ED visit on 12/15/15 for right knee pain, bakers cyst, and knee arthritis.   Assessment: Received EMMI Heart Failure  referral on 01/04/16.   Red flag triggers: Patient answered yes to questions, about meds.   Day # 29. EMMI Heart Failure program follow pending, patient contact.   Plan: RNCM will call patient for 2nd telephonic outreach attempt, EMM Heart Failure follow up, within 1 business week, if no return call from patient.  RNCM has sent patient successful outreach letter, [facility] pamphlet, magnet, EMMI handout " MAKING EVERYDAY TASKS EASIER - ARTHRITIS" , EMMI handout "MANAGING YOUR WEIGHT WITH HEALTHY EATING" and Clinical Key educational handout " RICE for Routine Care of Injuries" on 12/22/15.   RNCM will follow up with patient to verify receipt and ask if there are in questions, within 1 business week.   [name]. Annia Friendly, BSN, [place] Management [facility] Telephonic CM Phone: [phone] Fax: [phone]

## 2016-01-05 ENCOUNTER — Other Ambulatory Visit: Payer: Self-pay | Admitting: *Deleted

## 2016-01-05 NOTE — Patient Outreach (Addendum)
[place] [facility]) Care Management  01/05/2016  Jessica Howard [birth year] [id number]   Subjective :  Telephone call to patient's home / mobile number, spoke with patient, and HIPAA verified.   Patient states she is doing good and is familiar with this RNCM and [place] [facility] EMMI Heart Failure follow up from previous conversations.    Patient in agreement to complete EMMI Heart Failure follow up. States she does not have any medication questions the automated EMMI may have misinterpreted her response.   States she did receive [facility] patient successful outreach letter, [place] [facility] pamphlet, magnet, EMMI handout " MAKING EVERYDAY TASKS EASIER - ARTHRITIS" , EMMI handout "MANAGING YOUR WEIGHT WITH HEALTHY EATING",  Clinical Key educational handout " RICE for Routine Care of Injuries", and does not have any questions.  Patient will continue to receive [place] [facility] daily EMMI Heart Failure automated calls and RNCM follow up calls as needed.   Objective: Per chart review: Patient hospitalized 12/01/15 - 12/02/15 for congestive heart failure. Patient also has a history of hyperlipidemia, acute diastolic congestive heart failure, diabetes, rheumatoid arthritis, psoriasis, and obstructive sleep apnea. Patient had ED visit on 12/15/15 for right knee pain, bakers cyst, and knee arthritis.   Assessment: Received EMMI Heart Failure referral on 01/04/16.   Red flag triggers: Patient answered yes to questions, about meds. Day # 29. EMMI Heart Failure program follow completed, patient has no additional care management needs at this time.   RNCM will to follow up on EMMI Heart Failure red alerts as needed and change case status to active due to frequency of red alert follow ups.   Plan: RNCM will call patient for EMMI Heart Failure follow ups within  3 business weeks, if no red alert follow ups needed.   RNCM will send case status activation request to [name] at [place]  [facility].     Haneen Bernales H. Annia Friendly, BSN, [place] Management [facility] Telephonic CM Phone: [phone] Fax: [phone]

## 2016-01-09 ENCOUNTER — Encounter (HOSPITAL_COMMUNITY)
Admission: RE | Admit: 2016-01-09 | Discharge: 2016-01-09 | Disposition: A | Discharge status: Home / Self Care | Payer: Medicare Other | Source: Ambulatory Visit | Attending: Rheumatology | Admitting: Rheumatology

## 2016-01-09 ENCOUNTER — Ambulatory Visit (HOSPITAL_COMMUNITY): Payer: Medicare Other

## 2016-01-09 ENCOUNTER — Other Ambulatory Visit: Payer: Self-pay | Admitting: *Deleted

## 2016-01-09 DIAGNOSIS — M0589 Other rheumatoid arthritis with rheumatoid factor of multiple sites: Secondary | ICD-10-CM | POA: Diagnosis not present

## 2016-01-09 NOTE — Progress Notes (Signed)
Pt with new history of CHF.  Pt stated she has seen PCP who referred her to a cardiologist and has an appt on 01/12/16.  PT stated Dr Dossie Der aware and was okay to get orencia.  I called to verify with DR Dossie Der and she said she wants her to wait until she has been evaluated by her cardiologist before she gets orencia because they do not know whats causing her CHF as of yet.  I explained to the patient the above and told her after she sees the cardiologist to let Dr Dossie Der know and find out if it is okay to reschedule with [place].  Pt verbalized understanding.

## 2016-01-09 NOTE — Patient Outreach (Addendum)
[place] [facility]) Care Management  01/09/2016  Jessica Howard Sep 21, 1960 [id number]   Subjective: Received voicemail message from patient.   Patient verified name, date of birth, and address.  States she wanted to let this RNCM know that she was ok and RNCM may get an alert to call her for a problem.   States if RNCM gets an alert to call her it was a mistake and the automated misunderstand her response, she is fine, and does need a call back.   States RNCM is welcome to call but patient states she does not have any issues at this time. Patient will continue to receive EMMI Heart Failure automated daily calls and  follow up calls as needed.   Objective: Per chart review: Patient hospitalized 12/01/15 - 12/02/15 for congestive heart failure. Patient also has a history of hyperlipidemia, acute diastolic congestive heart failure, diabetes, rheumatoid arthritis, psoriasis, and obstructive sleep apnea. Patient had ED visit on 12/15/15 for right knee pain, bakers cyst, and knee arthritis.   Assessment: EMMI Heart Failure program follow completed on 01/05/16, patient has no additional care management needs at this time.   RNCM will to follow up on EMMI Heart Failure red alerts as needed and change case status to active due to frequency of red alert follow ups.  Plan: RNCM will call patient for EMMI Heart Failure follow ups within  3 business weeks, if no red alert follow ups needed.     Oral Hallgren H. Annia Friendly, BSN, [place] Management [facility] Telephonic CM Phone: [phone] Fax: [phone]

## 2016-01-10 NOTE — Progress Notes (Signed)
HPI  The patient presents for hospital follow-up after admission in June with dyspnea. I saw her in 2015 for shortness of breath. She had a preserved ejection fraction at that time.  She had a negative stress perfusion study. There was no clear etiology of her dyspnea. I suggested possible pulmonary follow-up.   She was in the hospital recently and I did review these records. She presented for evaluation of dyspnea.   There was thought probably to be some heart failure but her BNP was only mildly elevated. Echo was unremarkable. Cardiac enzymes were negative.  She returns for follow up.  She has had no acute symptoms. She does get some shortness of breath with activity. However, this is been chronic. She's not describing any new shortness of breath, PND or orthopnea. She's had no new palpitations, presyncope or syncope. No chest pressure, neck or arm discomfort. She is limited by joint problems and back pain.   Allergies  Allergen Reactions  . Bactrim [Sulfamethoxazole-Trimethoprim] Hives  . Cefuroxime Axetil Itching  . Cephalosporins Itching  . Lisinopril Cough  . Penicillins Hives  . Sulfa Antibiotics Hives  . Iohexol Itching and Rash     Code: RASH, Desc: HAD ITCHING AND A RASH ABOUT ONE HOUR AFTER RETURNING HOME FROM THE CT, Onset Date: [id number]     Current Outpatient Prescriptions  Medication Sig Dispense Refill  . abatacept (ORENCIA) 250 MG injection Inject 1,000 mg into the vein every 28 (twenty-eight) days. Monthly Infusion    . amitriptyline (ELAVIL) 25 MG tablet Take 25 mg by mouth at bedtime.     . chlorthalidone (HYGROTON) 25 MG tablet Take 25 mg by mouth daily.    . CRESTOR 20 MG tablet Take 20 mg by mouth daily.     Marland Kitchen diltiazem (CARDIZEM CD) 240 MG 24 hr capsule Take 240 mg by mouth daily.     . fluticasone (FLONASE) 50 MCG/ACT nasal spray Place 1 spray into both nostrils daily as needed for allergies or rhinitis.    Marland Kitchen gabapentin (NEURONTIN) 300 MG capsule Take 600 mg by  mouth 3 (three) times daily.     . insulin aspart (NOVOLOG) 100 UNIT/ML injection Inject 55-60 Units into the skin 3 (three) times daily before meals. 55 base Plus sliding scale    . insulin NPH (HUMULIN N,NOVOLIN N) 100 UNIT/ML injection Inject 75 Units into the skin 3 (three) times daily with meals. 75 units with breakfast; lunch 75 units; 75 units at dinner    . irbesartan (AVAPRO) 300 MG tablet Take 300 mg by mouth daily.     Marland Kitchen leflunomide (ARAVA) 20 MG tablet Take 20 mg by mouth daily.    . metFORMIN (GLUCOPHAGE-XR) 500 MG 24 hr tablet Take 500 mg by mouth every evening.    . methocarbamol (ROBAXIN) 500 MG tablet Take 500 mg by mouth every 8 (eight) hours as needed for muscle spasms.    . Nebivolol HCl (BYSTOLIC) 20 MG TABS Take 1 tablet by mouth daily.    . pantoprazole (PROTONIX) 40 MG tablet Take 40 mg by mouth daily.    . predniSONE (DELTASONE) 5 MG tablet Take 5 mg by mouth daily.    Marland Kitchen tiZANidine (ZANAFLEX) 4 MG tablet Take 4 mg by mouth 2 (two) times daily as needed (migraines). Limit 2 days per week    . topiramate (TOPAMAX) 100 MG tablet Take 200 mg by mouth daily.     . TRULICITY A999333 0000000 SOPN Inject 0.75 mg into the  skin once a week. Patient takes on Wednesday.    . VOLTAREN 1 % GEL Apply 1 application topically 3 (three) times daily.      No current facility-administered medications for this visit.     Past Medical History:  Diagnosis Date  . Asthma   . Diabetes mellitus    type 2  . Diabetic neuropathy ([place])   . Endometrial polyp   . Fibroadenoma of breast    left  . GERD (gastroesophageal reflux disease)   . Headache    migraines  . Hepatic steatosis   . Hyperlipidemia   . Hypertension   . Impingement syndrome of right shoulder   . Nontraumatic tear of right rotator cuff   . Pneumonia   . Psoriasis   . Rheumatoid arthritis(714.0)    rhematoid and osteoarthritis  . Sleep apnea   . Thyroid disease    had radiation  . Unspecified deficiency anemia     Thalassemia    Past Surgical History:  Procedure Laterality Date  . ANKLE ARTHROSCOPY Right   . CESAREAN SECTION    . COLONOSCOPY    . DILATION AND CURETTAGE OF UTERUS    . EYE SURGERY     surgery on retina  . HAND SURGERY     for arthritis  . left knee surgery nodule removal    . OTHER SURGICAL HISTORY     multiple nodule removal on neck, hands, left knee  . SHOULDER ARTHROSCOPY WITH ROTATOR CUFF REPAIR AND SUBACROMIAL DECOMPRESSION Right 05/20/2014   Procedure: RIGHT SHOULDER ARTHROSCOPY WITH DEBRIDEMENT/DISTAL CLAVICLE EXCISION/ROTATOR CUFF REPAIR AND SUBACROMIAL DECOMPRESSION;  Surgeon: Lorn Junes, MD;  Location: [facility];  Service: Orthopedics;  Laterality: Right;  . TONSILLECTOMY    . TUBAL LIGATION     bilateral    ROS:  As stated in the HPI and negative for all other systems.  PHYSICAL EXAM BP 136/88 (BP Location: Left Arm, Patient Position: Sitting, Cuff Size: Normal)   Pulse 97   Ht 5\' 3"  (1.6 m)   Wt 298 lb 12.8 oz (135.5 kg)   LMP 12/27/2013 Comment: irreggular  BMI 52.93 kg/m  GENERAL:  Well appearing NECK:  No jugular venous distention, waveform within normal limits, carotid upstroke brisk and symmetric, no bruits, no thyromegaly LYMPHATICS:  No cervical, inguinal adenopathy LUNGS:  Clear to auscultation bilaterally BACK:  No CVA tenderness CHEST:  Unremarkable HEART:  PMI not displaced or sustained,S1 and S2 within normal limits, no S3, no S4, no clicks, no rubs, no murmurs ABD:  Flat, positive bowel sounds normal in frequency in pitch, no bruits, no rebound, no guarding, no midline pulsatile mass, no hepatomegaly, no splenomegaly, obese EXT:  2 plus pulses throughout, mild bilateral edema, no cyanosis no clubbing  Lab Results  Component Value Date   HGBA1C 10.0 (H) 12/01/2015    ASSESSMENT AND PLAN  DYSPNEA:  As described in the hospital I think this is likely multifactorial. She likely does have some diastolic dysfunction. However, we talked about  control of this with salt restriction, fluid restriction, control of her blood pressure treatment for sleep apnea. No change in therapy or further evaluation is planned.  HTN:  The blood pressure is at target. No change in medications is indicated. We will continue with therapeutic lifestyle changes (TLC).  OBESITY:  We spent a long time talking about this and we talked about very specific diet and exercise measures that she could take this slowly begin to work on this.  RA:  The patient needs clearance to restart Orencia.  I don't see any cardiac contraindication to this. Certainly with her difficult to control blood pressure this will need to be watched as it can go up with this medication.    HOSPITAL RECORDS REVIEWED.

## 2016-01-12 ENCOUNTER — Ambulatory Visit (INDEPENDENT_AMBULATORY_CARE_PROVIDER_SITE_OTHER): Payer: Medicare Other | Admitting: Cardiology

## 2016-01-12 ENCOUNTER — Encounter: Payer: Self-pay | Admitting: Cardiology

## 2016-01-12 VITALS — BP 136/88 | HR 97 | Ht 63.0 in | Wt 298.8 lb

## 2016-01-12 DIAGNOSIS — R0602 Shortness of breath: Secondary | ICD-10-CM

## 2016-01-12 NOTE — Patient Instructions (Signed)
Medication Instructions:  Continue current medications  Labwork: NONE  Testing/Procedures: NONE  Follow-Up: Your physician recommends that you schedule a follow-up appointment in: As Needed  Any Other Special Instructions Will Be Listed Below (If Applicable).   If you need a refill on your cardiac medications before your next appointment, please call your pharmacy.

## 2016-01-13 ENCOUNTER — Other Ambulatory Visit: Payer: Self-pay | Admitting: *Deleted

## 2016-01-13 NOTE — Patient Outreach (Addendum)
[facility]) Care Management  01/13/2016  Jessica Howard 01-02-1961 [id number]   Subjective: Telephone call to patient's home / mobile, spoke with patient, and HIPAA verified.  States she is doing great.      States the EMMI automated system is interpreting her voice responses incorrectly and keeps triggering a false alert for her to call her MD.   States she has been taking daily weights but has not always taken it before EMMI calls her.  RNCM advised patient's 45th / final day of automated calls will be on 01/18/16 and to continue to notify RNCM if no follow up needed on red alert.   Patient in agreement and voices understanding.  Patient states she had a good visit with cardiologist on 01/12/16.  States she really liked the way the MD spoke with her, and was very nice.  States MD advised her to make small change dietary (remove calories, decrease salt intake, lower intake ), lose some weight,  increase exercise, and monitor blood pressure.   Patient states she does not have a cuff,  will purchase a blood pressure cuff in the future, and is aware of the specific type to purchase.  States her blood pressure was good at the MD's office on 01/12/16 and it was 138/88. RNCM educated patient on monitoring heart failure status and daily weights.   Patient voices understanding and states she is looking into doing water exercise and making dietary changes.   States her weight today is 297.4 lbs.  States she is does not have weight parameters for extra Lasix and is aware to call MD for changes as needed.  States she has an upcoming appointment on 01/17/16 with cancer center for Thalassemia shot and on 01/23/16 with neurosurgeon for possible back surgery consult.  States she is currently dealing with her back pain, wants to delay the surgery for now, since she is dealing with so many issues right now,  if it is possible.   Patient will continue to receive [facility] Care Management EMMI Heart Failure follow up.    Patient in agreement to referral in the future for congestive heart failure or hypertension disease management.   Objective: Per chart review: Patient hospitalized 12/01/15 - 12/02/15 for congestive heart failure. Patient also has a history of hyperlipidemia, acute diastolic congestive heart failure, diabetes, rheumatoid arthritis, psoriasis, and obstructive sleep apnea. Patient had ED visit on 12/15/15 for right knee pain, bakers cyst, and knee arthritis.   Assessment: EMMI Heart Failure program follow completed and  patient has no additional care management needs at this time. RNCM will to follow up on EMMI Heart Failure red alerts as needed and change case status to active due to frequency of red alert follow ups.  Plan:RNCM will call patient for EMMI Heart Failure follow ups within 3 business weeks, if no red alert follow ups needed.  RNCM will transition patient to congestive heart failure or hypertension program if appropriate within 3 business weeks after completion of automated EMMI calls.  RNCM will follow up with patient to verify blood pressure cuff purchased within 3 business weeks.    Amaiya Scruton H. Annia Friendly, BSN, [facility] [facility] Telephonic CM Phone: [phone] Fax: [phone]

## 2016-01-17 ENCOUNTER — Ambulatory Visit (HOSPITAL_BASED_OUTPATIENT_CLINIC_OR_DEPARTMENT_OTHER): Payer: Medicare Other | Admitting: Oncology

## 2016-01-17 ENCOUNTER — Ambulatory Visit: Payer: Medicare Other

## 2016-01-17 ENCOUNTER — Other Ambulatory Visit (HOSPITAL_BASED_OUTPATIENT_CLINIC_OR_DEPARTMENT_OTHER): Payer: Medicare Other

## 2016-01-17 VITALS — BP 174/70 | HR 99 | Temp 98.5°F | Resp 19 | Wt 304.8 lb

## 2016-01-17 DIAGNOSIS — D569 Thalassemia, unspecified: Secondary | ICD-10-CM

## 2016-01-17 DIAGNOSIS — D638 Anemia in other chronic diseases classified elsewhere: Secondary | ICD-10-CM | POA: Diagnosis not present

## 2016-01-17 DIAGNOSIS — D649 Anemia, unspecified: Secondary | ICD-10-CM

## 2016-01-17 DIAGNOSIS — D509 Iron deficiency anemia, unspecified: Secondary | ICD-10-CM

## 2016-01-17 DIAGNOSIS — M069 Rheumatoid arthritis, unspecified: Secondary | ICD-10-CM | POA: Diagnosis not present

## 2016-01-17 LAB — CBC WITH DIFFERENTIAL/PLATELET
BASO%: 0.2 % (ref 0.0–2.0)
BASOS ABS: 0 10*3/uL (ref 0.0–0.1)
EOS%: 1.2 % (ref 0.0–7.0)
Eosinophils Absolute: 0.1 10*3/uL (ref 0.0–0.5)
HCT: 31.3 % — ABNORMAL LOW (ref 34.8–46.6)
HGB: 9.8 g/dL — ABNORMAL LOW (ref 11.6–15.9)
LYMPH%: 28.2 % (ref 14.0–49.7)
MCH: 20.9 pg — AB (ref 25.1–34.0)
MCHC: 31.3 g/dL — ABNORMAL LOW (ref 31.5–36.0)
MCV: 66.6 fL — AB (ref 79.5–101.0)
MONO#: 0.9 10*3/uL (ref 0.1–0.9)
MONO%: 11.1 % (ref 0.0–14.0)
NEUT#: 4.8 10*3/uL (ref 1.5–6.5)
NEUT%: 59.3 % (ref 38.4–76.8)
Platelets: 276 10*3/uL (ref 145–400)
RBC: 4.7 10*6/uL (ref 3.70–5.45)
RDW: 16.7 % — AB (ref 11.2–14.5)
WBC: 8.1 10*3/uL (ref 3.9–10.3)
lymph#: 2.3 10*3/uL (ref 0.9–3.3)
nRBC: 0 % (ref 0–0)

## 2016-01-17 NOTE — Progress Notes (Signed)
Hematology and Oncology Follow Up Visit  Jessica Howard [id number] 07-15-1960 55 y.o. 01/17/2016 1:44 PM   Principle Diagnosis: This is a 55 year old female with anemia of renal disease. She has also microcytic anemia due to beta thalassemia. She also has element of anemia of chronic disease with history of rheumatoid arthritis.  Current therapy: Aranesp 300 g every 4 weeks to keep her hemoglobin above 10.  Interim History:  Jessica Howard presents today for a follow-up visit. Since her last visit, she reports doing well without any recent illnesses or hospitalizations. She continues to have chronic pain issues and have received injections for possible spinal stenosis and osteoarthritis which have not changed dramatically.   She is tolerating the Aranesp injections without difficulty although she has not had to receive it in a few months. She denied any thrombosis or bleeding episodes. Her blood pressure have been reasonably controlled.  She reports her energy and performance status is improved on these injections. She denied any GI bleeding complaints.  She did not report headaches or blurry vision or syncope. She did not report any chest pain or palpitation. She is not reporting nausea, vomiting, abdominal pain. She does not report any hematochezia or bleeding per rectum. Does not report any frequency urgency or hesitancy. She does not report any hematuria hematochezia or melena. Remainder of her review of systems unremarkable.   Medications: I have reviewed the patient's current medications.  Current Outpatient Prescriptions  Medication Sig Dispense Refill  . abatacept (ORENCIA) 250 MG injection Inject 1,000 mg into the vein every 28 (twenty-eight) days. Monthly Infusion    . amitriptyline (ELAVIL) 25 MG tablet Take 25 mg by mouth at bedtime.     . chlorthalidone (HYGROTON) 25 MG tablet Take 25 mg by mouth daily.    . CRESTOR 20 MG tablet Take 20 mg by mouth daily.     Marland Kitchen diltiazem  (CARDIZEM CD) 240 MG 24 hr capsule Take 240 mg by mouth daily.     . fluticasone (FLONASE) 50 MCG/ACT nasal spray Place 1 spray into both nostrils daily as needed for allergies or rhinitis.    Marland Kitchen gabapentin (NEURONTIN) 300 MG capsule Take 600 mg by mouth 3 (three) times daily.     . insulin aspart (NOVOLOG) 100 UNIT/ML injection Inject 55-60 Units into the skin 3 (three) times daily before meals. 55 base Plus sliding scale    . insulin NPH (HUMULIN N,NOVOLIN N) 100 UNIT/ML injection Inject 75 Units into the skin 3 (three) times daily with meals. 75 units with breakfast; lunch 75 units; 75 units at dinner    . irbesartan (AVAPRO) 300 MG tablet Take 300 mg by mouth daily.     Marland Kitchen leflunomide (ARAVA) 20 MG tablet Take 20 mg by mouth daily.    . metFORMIN (GLUCOPHAGE-XR) 500 MG 24 hr tablet Take 500 mg by mouth every evening.    . methocarbamol (ROBAXIN) 500 MG tablet Take 500 mg by mouth every 8 (eight) hours as needed for muscle spasms.    . Nebivolol HCl (BYSTOLIC) 20 MG TABS Take 1 tablet by mouth daily.    . pantoprazole (PROTONIX) 40 MG tablet Take 40 mg by mouth daily.    . predniSONE (DELTASONE) 5 MG tablet Take 5 mg by mouth daily.    Marland Kitchen tiZANidine (ZANAFLEX) 4 MG tablet Take 4 mg by mouth 2 (two) times daily as needed (migraines). Limit 2 days per week    . topiramate (TOPAMAX) 100 MG tablet Take 200 mg by  mouth daily.     . TRULICITY A999333 0000000 SOPN Inject 0.75 mg into the skin once a week. Patient takes on Wednesday.    . VOLTAREN 1 % GEL Apply 1 application topically 3 (three) times daily.      No current facility-administered medications for this visit.     Allergies:  Allergies  Allergen Reactions  . Bactrim [Sulfamethoxazole-Trimethoprim] Hives  . Cefuroxime Axetil Itching  . Cephalosporins Itching  . Lisinopril Cough  . Penicillins Hives  . Sulfa Antibiotics Hives  . Iohexol Itching and Rash     Code: RASH, Desc: HAD ITCHING AND A RASH ABOUT ONE HOUR AFTER RETURNING HOME  FROM THE CT, Onset Date: [id number]     Past Medical History, Surgical history, Social history, and Family History were reviewed and updated.   Blood pressure (!) 174/70, pulse 99, temperature 98.5 F (36.9 C), temperature source Oral, resp. rate 19, weight (!) 304 lb 12.8 oz (138.3 kg), last menstrual period 12/27/2013, SpO2 99 %. ECOG: 1 General appearance: Well-appearing woman without distress. Head: Normocephalic, without obvious abnormality. No oral ulcers or lesions. Neck: no adenopathy Lymph nodes: Cervical, supraclavicular, and axillary nodes normal. Heart:regular rate and rhythm, S1, S2 normal, no murmur, click, rub or gallop Lung:chest clear, no wheezing, rales, normal symmetric air entry Abdomin: soft, non-tender, without masses or organomegaly. No shifting dullness or ascites. EXT: Mild edema noted in her lower extremities. 1+ bilaterally.   CBC    Component Value Date/Time   WBC 8.1 01/17/2016 1315   WBC 9.0 12/02/2015 0450   RBC 4.70 01/17/2016 1315   RBC 4.33 12/02/2015 0450   HGB 9.8 (L) 01/17/2016 1315   HCT 31.3 (L) 01/17/2016 1315   PLT 276 01/17/2016 1315   MCV 66.6 (L) 01/17/2016 1315   MCH 20.9 (L) 01/17/2016 1315   MCH 20.8 (L) 12/02/2015 0450   MCHC 31.3 (L) 01/17/2016 1315   MCHC 30.7 12/02/2015 0450   RDW 16.7 (H) 01/17/2016 1315   LYMPHSABS 2.3 01/17/2016 1315   MONOABS 0.9 01/17/2016 1315   EOSABS 0.1 01/17/2016 1315   BASOSABS 0.0 01/17/2016 1315       Impression and Plan:  A 55 year old female with the following issues:   1. Anemia of renal insufficiency: Her hemoglobin today is close to 10 which is her baseline at this time and does not require Aranesp. She has tolerated this therapy well and the plan is to continue with the monthly monitoring and she will receive Aranesp to boost her hemoglobin above 10.  2. 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 Her iron studies checked in June  2017 and appear within normal range. These will be repeated in September 2017.  3. Rheumatoid arthritis: Likely contributing to chronic anemia related to chronic inflammatory process. She is currently on Orencia which have helped her symptoms. In  4. Follow-up: Will be in 4 months to monitor her clinical status.  Zola Button, MD 8/9/20171:44 PM

## 2016-01-19 ENCOUNTER — Encounter (HOSPITAL_COMMUNITY): Payer: Self-pay | Admitting: Emergency Medicine

## 2016-01-19 ENCOUNTER — Inpatient Hospital Stay (HOSPITAL_COMMUNITY)
Admission: EM | Admit: 2016-01-19 | Discharge: 2016-01-21 | DRG: 565 | Disposition: A | Discharge status: Home / Self Care | Payer: Medicare Other | Attending: Family Medicine | Admitting: Family Medicine

## 2016-01-19 ENCOUNTER — Emergency Department (HOSPITAL_COMMUNITY): Payer: Medicare Other

## 2016-01-19 ENCOUNTER — Observation Stay (HOSPITAL_COMMUNITY): Payer: Medicare Other

## 2016-01-19 ENCOUNTER — Ambulatory Visit: Payer: Self-pay | Admitting: *Deleted

## 2016-01-19 DIAGNOSIS — M069 Rheumatoid arthritis, unspecified: Secondary | ICD-10-CM | POA: Diagnosis present

## 2016-01-19 DIAGNOSIS — I10 Essential (primary) hypertension: Secondary | ICD-10-CM | POA: Diagnosis present

## 2016-01-19 DIAGNOSIS — Z833 Family history of diabetes mellitus: Secondary | ICD-10-CM

## 2016-01-19 DIAGNOSIS — E11618 Type 2 diabetes mellitus with other diabetic arthropathy: Secondary | ICD-10-CM

## 2016-01-19 DIAGNOSIS — Z794 Long term (current) use of insulin: Secondary | ICD-10-CM | POA: Diagnosis not present

## 2016-01-19 DIAGNOSIS — M25551 Pain in right hip: Secondary | ICD-10-CM

## 2016-01-19 DIAGNOSIS — Z87891 Personal history of nicotine dependence: Secondary | ICD-10-CM

## 2016-01-19 DIAGNOSIS — D72829 Elevated white blood cell count, unspecified: Secondary | ICD-10-CM | POA: Diagnosis not present

## 2016-01-19 DIAGNOSIS — Z6841 Body Mass Index (BMI) 40.0 and over, adult: Secondary | ICD-10-CM

## 2016-01-19 DIAGNOSIS — M25559 Pain in unspecified hip: Secondary | ICD-10-CM | POA: Diagnosis present

## 2016-01-19 DIAGNOSIS — J45909 Unspecified asthma, uncomplicated: Secondary | ICD-10-CM | POA: Diagnosis present

## 2016-01-19 DIAGNOSIS — M25451 Effusion, right hip: Secondary | ICD-10-CM | POA: Diagnosis not present

## 2016-01-19 DIAGNOSIS — D849 Immunodeficiency, unspecified: Secondary | ICD-10-CM

## 2016-01-19 DIAGNOSIS — Z7951 Long term (current) use of inhaled steroids: Secondary | ICD-10-CM

## 2016-01-19 DIAGNOSIS — E785 Hyperlipidemia, unspecified: Secondary | ICD-10-CM | POA: Diagnosis present

## 2016-01-19 DIAGNOSIS — E1140 Type 2 diabetes mellitus with diabetic neuropathy, unspecified: Secondary | ICD-10-CM | POA: Diagnosis present

## 2016-01-19 DIAGNOSIS — M659 Synovitis and tenosynovitis, unspecified: Secondary | ICD-10-CM | POA: Diagnosis present

## 2016-01-19 DIAGNOSIS — Z79899 Other long term (current) drug therapy: Secondary | ICD-10-CM

## 2016-01-19 DIAGNOSIS — R700 Elevated erythrocyte sedimentation rate: Secondary | ICD-10-CM

## 2016-01-19 DIAGNOSIS — K760 Fatty (change of) liver, not elsewhere classified: Secondary | ICD-10-CM | POA: Diagnosis not present

## 2016-01-19 DIAGNOSIS — D899 Disorder involving the immune mechanism, unspecified: Secondary | ICD-10-CM

## 2016-01-19 DIAGNOSIS — K219 Gastro-esophageal reflux disease without esophagitis: Secondary | ICD-10-CM | POA: Diagnosis present

## 2016-01-19 DIAGNOSIS — E1165 Type 2 diabetes mellitus with hyperglycemia: Secondary | ICD-10-CM

## 2016-01-19 DIAGNOSIS — G4733 Obstructive sleep apnea (adult) (pediatric): Secondary | ICD-10-CM | POA: Diagnosis present

## 2016-01-19 DIAGNOSIS — Z7952 Long term (current) use of systemic steroids: Secondary | ICD-10-CM

## 2016-01-19 DIAGNOSIS — R7982 Elevated C-reactive protein (CRP): Secondary | ICD-10-CM

## 2016-01-19 DIAGNOSIS — Z809 Family history of malignant neoplasm, unspecified: Secondary | ICD-10-CM

## 2016-01-19 DIAGNOSIS — M25459 Effusion, unspecified hip: Secondary | ICD-10-CM | POA: Diagnosis present

## 2016-01-19 LAB — URINALYSIS, ROUTINE W REFLEX MICROSCOPIC
Bilirubin Urine: NEGATIVE
Glucose, UA: >1000 mg/dL — AB
Hgb urine dipstick: NEGATIVE
Ketones, ur: NEGATIVE mg/dL
LEUKOCYTES UA: NEGATIVE
NITRITE: NEGATIVE
PROTEIN: 30 mg/dL — AB
Specific Gravity, Urine: 1.03 (ref 1.005–1.030)
pH: 5.5 (ref 5.0–8.0)

## 2016-01-19 LAB — CBC WITH DIFFERENTIAL/PLATELET
Basophils Absolute: 0 10*3/uL (ref 0.0–0.1)
Basophils Relative: 0 %
Eosinophils Absolute: 0 10*3/uL (ref 0.0–0.7)
Eosinophils Relative: 0 %
HCT: 33.9 % — ABNORMAL LOW (ref 36.0–46.0)
Hemoglobin: 10.2 g/dL — ABNORMAL LOW (ref 12.0–15.0)
Lymphocytes Relative: 11 %
Lymphs Abs: 1.3 10*3/uL (ref 0.7–4.0)
MCH: 20.1 pg — ABNORMAL LOW (ref 26.0–34.0)
MCHC: 30.1 g/dL (ref 30.0–36.0)
MCV: 66.7 fL — ABNORMAL LOW (ref 78.0–100.0)
Monocytes Absolute: 0.5 10*3/uL (ref 0.1–1.0)
Monocytes Relative: 4 %
Neutro Abs: 10.1 10*3/uL — ABNORMAL HIGH (ref 1.7–7.7)
Neutrophils Relative %: 85 %
Platelets: 303 10*3/uL (ref 150–400)
RBC: 5.08 MIL/uL (ref 3.87–5.11)
RDW: 16 % — ABNORMAL HIGH (ref 11.5–15.5)
WBC: 11.9 10*3/uL — ABNORMAL HIGH (ref 4.0–10.5)

## 2016-01-19 LAB — BASIC METABOLIC PANEL
Anion gap: 11 (ref 5–15)
BUN: 19 mg/dL (ref 6–20)
CO2: 24 mmol/L (ref 22–32)
Calcium: 9.1 mg/dL (ref 8.9–10.3)
Chloride: 101 mmol/L (ref 101–111)
Creatinine, Ser: 0.94 mg/dL (ref 0.44–1.00)
GFR calc Af Amer: >60 mL/min (ref >60)
GFR calc non Af Amer: >60 mL/min (ref >60)
Glucose, Bld: 402 mg/dL — ABNORMAL HIGH (ref 65–99)
Potassium: 4.7 mmol/L (ref 3.5–5.1)
Sodium: 136 mmol/L (ref 135–145)

## 2016-01-19 LAB — URINE MICROSCOPIC-ADD ON: RBC / HPF: NONE SEEN RBC/hpf (ref 0–5)

## 2016-01-19 LAB — SEDIMENTATION RATE: Sed Rate: 31 mm/hr — ABNORMAL HIGH (ref 0–22)

## 2016-01-19 LAB — C-REACTIVE PROTEIN: CRP: 1.1 mg/dL — AB (ref <1.0)

## 2016-01-19 MED ORDER — TOPIRAMATE 100 MG PO TABS
200.0000 mg | ORAL_TABLET | Freq: Every day | ORAL | Status: DC
  Administered 2016-01-20 – 2016-01-21 (×2): 200 mg via ORAL
  Filled 2016-01-19 (×2): qty 2

## 2016-01-19 MED ORDER — ALBUTEROL SULFATE (2.5 MG/3ML) 0.083% IN NEBU: 2.5000 mg | INHALATION_SOLUTION | Freq: Four times a day (QID) | RESPIRATORY_TRACT | Status: DC | PRN

## 2016-01-19 MED ORDER — INSULIN ASPART 100 UNIT/ML ~~LOC~~ SOLN: 0.0000 [IU] | Freq: Every day | SUBCUTANEOUS | Status: DC

## 2016-01-19 MED ORDER — INSULIN ASPART 100 UNIT/ML ~~LOC~~ SOLN
0.0000 [IU] | Freq: Three times a day (TID) | SUBCUTANEOUS | Status: DC
  Administered 2016-01-20: 8 [IU] via SUBCUTANEOUS
  Administered 2016-01-20: 5 [IU] via SUBCUTANEOUS
  Administered 2016-01-20: 8 [IU] via SUBCUTANEOUS
  Administered 2016-01-21: 3 [IU] via SUBCUTANEOUS

## 2016-01-19 MED ORDER — NEBIVOLOL HCL 10 MG PO TABS
20.0000 mg | ORAL_TABLET | Freq: Every day | ORAL | Status: DC
  Administered 2016-01-20 – 2016-01-21 (×2): 20 mg via ORAL
  Filled 2016-01-19: qty 4
  Filled 2016-01-19 (×2): qty 2
  Filled 2016-01-19: qty 4

## 2016-01-19 MED ORDER — DILTIAZEM HCL ER COATED BEADS 240 MG PO CP24
240.0000 mg | ORAL_CAPSULE | Freq: Every day | ORAL | Status: DC
  Administered 2016-01-20 – 2016-01-21 (×2): 240 mg via ORAL
  Filled 2016-01-19 (×2): qty 1

## 2016-01-19 MED ORDER — GABAPENTIN 300 MG PO CAPS
600.0000 mg | ORAL_CAPSULE | Freq: Three times a day (TID) | ORAL | Status: DC
  Administered 2016-01-19 – 2016-01-21 (×6): 600 mg via ORAL
  Filled 2016-01-19 (×6): qty 2

## 2016-01-19 MED ORDER — MORPHINE SULFATE (PF) 4 MG/ML IV SOLN
4.0000 mg | Freq: Once | INTRAVENOUS | Status: AC
  Administered 2016-01-19: 4 mg via INTRAVENOUS
  Filled 2016-01-19: qty 1

## 2016-01-19 MED ORDER — CHLORTHALIDONE 25 MG PO TABS
25.0000 mg | ORAL_TABLET | Freq: Every day | ORAL | Status: DC
  Administered 2016-01-20: 25 mg via ORAL
  Filled 2016-01-19 (×2): qty 1

## 2016-01-19 MED ORDER — ROSUVASTATIN CALCIUM 10 MG PO TABS
20.0000 mg | ORAL_TABLET | Freq: Every day | ORAL | Status: DC
  Administered 2016-01-20 – 2016-01-21 (×2): 20 mg via ORAL
  Filled 2016-01-19 (×2): qty 2

## 2016-01-19 MED ORDER — FLUTICASONE PROPIONATE 50 MCG/ACT NA SUSP
1.0000 | Freq: Every day | NASAL | Status: DC | PRN
  Filled 2016-01-19: qty 16

## 2016-01-19 MED ORDER — TIZANIDINE HCL 4 MG PO TABS
4.0000 mg | ORAL_TABLET | Freq: Two times a day (BID) | ORAL | Status: DC | PRN
  Administered 2016-01-19 – 2016-01-20 (×3): 4 mg via ORAL
  Filled 2016-01-19 (×3): qty 1

## 2016-01-19 MED ORDER — ENOXAPARIN SODIUM 30 MG/0.3ML ~~LOC~~ SOLN: 30.0000 mg | SUBCUTANEOUS | Status: DC

## 2016-01-19 MED ORDER — AMITRIPTYLINE HCL 25 MG PO TABS
25.0000 mg | ORAL_TABLET | Freq: Every day | ORAL | Status: DC
  Administered 2016-01-19 – 2016-01-20 (×2): 25 mg via ORAL
  Filled 2016-01-19 (×2): qty 1

## 2016-01-19 MED ORDER — KETOROLAC TROMETHAMINE 30 MG/ML IJ SOLN
15.0000 mg | Freq: Once | INTRAMUSCULAR | Status: AC
  Administered 2016-01-19: 15 mg via INTRAVENOUS
  Filled 2016-01-19: qty 1

## 2016-01-19 MED ORDER — HYDROMORPHONE HCL 1 MG/ML IJ SOLN
1.0000 mg | Freq: Once | INTRAMUSCULAR | Status: AC
  Administered 2016-01-19: 1 mg via INTRAVENOUS
  Filled 2016-01-19: qty 1

## 2016-01-19 MED ORDER — LEFLUNOMIDE 20 MG PO TABS
20.0000 mg | ORAL_TABLET | Freq: Every day | ORAL | Status: DC
  Administered 2016-01-20: 20 mg via ORAL
  Filled 2016-01-19 (×2): qty 1

## 2016-01-19 MED ORDER — METHOCARBAMOL 500 MG PO TABS
500.0000 mg | ORAL_TABLET | Freq: Three times a day (TID) | ORAL | Status: DC | PRN
  Administered 2016-01-20 (×2): 500 mg via ORAL
  Filled 2016-01-19 (×2): qty 1

## 2016-01-19 MED ORDER — PREDNISONE 5 MG PO TABS
5.0000 mg | ORAL_TABLET | Freq: Every day | ORAL | Status: DC
  Administered 2016-01-20 – 2016-01-21 (×2): 5 mg via ORAL
  Filled 2016-01-19 (×2): qty 1

## 2016-01-19 MED ORDER — PANTOPRAZOLE SODIUM 40 MG PO TBEC
40.0000 mg | DELAYED_RELEASE_TABLET | Freq: Every day | ORAL | Status: DC
  Administered 2016-01-20 – 2016-01-21 (×2): 40 mg via ORAL
  Filled 2016-01-19 (×2): qty 1

## 2016-01-19 MED ORDER — GADOBENATE DIMEGLUMINE 529 MG/ML IV SOLN
20.0000 mL | Freq: Once | INTRAVENOUS | Status: AC
  Administered 2016-01-19: 20 mL via INTRAVENOUS

## 2016-01-19 MED ORDER — INSULIN NPH (HUMAN) (ISOPHANE) 100 UNIT/ML ~~LOC~~ SUSP
75.0000 [IU] | Freq: Three times a day (TID) | SUBCUTANEOUS | Status: DC
  Administered 2016-01-20 – 2016-01-21 (×4): 75 [IU] via SUBCUTANEOUS
  Filled 2016-01-19: qty 10

## 2016-01-19 MED ORDER — HYDROCODONE-ACETAMINOPHEN 5-325 MG PO TABS
2.0000 | ORAL_TABLET | Freq: Once | ORAL | Status: AC
Start: 2016-01-19 — End: 2016-01-19
  Administered 2016-01-19: 2 via ORAL
  Filled 2016-01-19: qty 2

## 2016-01-19 NOTE — ED Notes (Signed)
Patient is currently in MRI.  Report called to [facility].  Will transport patient to [facility] once MRI is done

## 2016-01-19 NOTE — ED Notes (Signed)
Pt with multiple co-morbidities c/o right hip and groin pain, unable to ambulate. Onset last pm while sitting in chair. No known injury.

## 2016-01-19 NOTE — ED Provider Notes (Signed)
  MSE was initiated and I personally evaluated and placed orders (if any) at 11:59 AM on 01/19/2016   The patient appears stable so that the remainder of the MSE may be completed by another provider.   Subjective:  The history is provided by the patient. No language interpreter was used.  Jessica Howard is a 55 y.o. female with PMH of RA, morbid obesity, back pain, renal insufficiency, DM, HLD, CHF who presents to the Emergency Department complaining of sudden onset, atraumatic stabbing, throbbing, right sided groin pain with associated right hip pain which began last night ~2100/2200. She also notes radiation of pain into the right thigh region. She was sitting in a chair at time of onset. She states she has been having difficulty lifting her leg and ambulating due to pain. Pt denies recent fall/injury or acute back pain. She has taken oxycodone with mild relief. She has a h/o RA. Pt takes 5mg  Prednisione daily.   Objective:  Constitutional: Pt is oriented to person, place, and time. Pt appears well-developed and obese. No distress.  HENT:  Head: Normocephalic and atraumatic.  Eyes: Conjunctivae are normal.  Cardiovascular: Normal rate. Intact DP pulses bilaterally. Pulmonary/Chest: Effort normal.  Abdominal: Pt exhibits no distension.  Neurological: Pt is alert and oriented to person, place, and time. Sensation intact.  Deferred strength due to pain. She is unable to ambulate. Musculoskeletal: patient is unable to perform straight leg raise of the right hip.  Minimal active range of motion. Right hip exquisitely TTP. Skin: Skin is warm and dry.  Psychiatric: Pt has a normal mood and affect.  Nursing note and vitals reviewed.    Assessment and Plan:  Patient presents with acute right hip pain.  On chronic steroids with hx of RA.  Concern for AVN.  Plain films and labs ordered.  If plain films negative, I suspect patient will need CT for further evaluation.   Placed initial orders and will  have pt assessed in higher acuity department.   By signing my name below, I, Irving Shows, attest that this documentation has been prepared under the direction and in the presence of non-physician practitioner, Gloriann Loan, PA-C Electronically Signed: Irving Shows, Scribe. 01/19/2016. 11:59 AM.   I personally performed the services described in this documentation, which was scribed in my presence. The recorded information has been reviewed and is accurate.       Gloriann Loan, PA-C 01/19/16 1210    Duffy Bruce, MD 01/20/16 1045

## 2016-01-19 NOTE — ED Provider Notes (Signed)
[facility] DEPT Provider Note   CSN: [id number] Arrival date & time: 01/19/16  1108  First Provider Contact:  First MD Initiated Contact with Patient 01/19/16 1140        History   Chief Complaint Chief Complaint  Patient presents with  . Groin Pain  . Hip Pain    HPI MARIANGELA HELDT is a 55 y.o. female.  The history is provided by the patient, medical records and a relative.   55 year old female with past medical history of RA who presents with atraumatic right hip pain. Pain began 24 hours ago when she awoke. No preceding falls or injuries. Since then she has had progressively worsening right hip pain and is now unable to walk. Describes pain as aching, throbbing pain in right hip. No radiation. Worse with any movement or palpation. No allevaiting factors.  Past Medical History:  Diagnosis Date  . Asthma   . Diabetes mellitus    type 2  . Diabetic neuropathy ([place])   . Endometrial polyp   . Fibroadenoma of breast    left  . GERD (gastroesophageal reflux disease)   . Headache    migraines  . Hepatic steatosis   . Hyperlipidemia   . Hypertension   . Impingement syndrome of right shoulder   . Nontraumatic tear of right rotator cuff   . Pneumonia   . Psoriasis   . Rheumatoid arthritis(714.0)    rhematoid and osteoarthritis  . Sleep apnea   . Thyroid disease    had radiation  . Unspecified deficiency anemia    Thalassemia    Patient Active Problem List   Diagnosis Date Noted  . Congestive heart failure (CHF) ([place]) 12/01/2015  . Anemia 12/01/2015  . CHF (congestive heart failure) ([place]) 12/01/2015  . Acute diastolic CHF (congestive heart failure) ([place])   . Right rotator cuff tear 05/20/2014  . Nontraumatic tear of right rotator cuff   . Impingement syndrome of right shoulder   . Thalassemia 02/28/2014  . On prednisone therapy 02/28/2014  . Rheumatoid arthritis ([place]) 02/28/2014  . Morbid obesity ([place]) 02/28/2014  . Shortness of breath 02/28/2014  .  Renal insufficiency 02/28/2014  . Edema 02/28/2014  . Back pain 02/28/2014  . Essential hypertension 02/28/2014  . Diabetes ([place]) 02/28/2014  . Dyslipidemia 02/28/2014  . Obstructive sleep apnea 02/28/2014  . Acute on chronic diastolic congestive heart failure, NYHA class 2 ([place]) 02/28/2014  . Chest pain 02/27/2014  . Iron deficiency anemia 02/16/2014  . Post-menopausal bleeding 08/28/2012    Past Surgical History:  Procedure Laterality Date  . ANKLE ARTHROSCOPY Right   . CESAREAN SECTION    . COLONOSCOPY    . DILATION AND CURETTAGE OF UTERUS    . EYE SURGERY     surgery on retina  . HAND SURGERY     for arthritis  . left knee surgery nodule removal    . OTHER SURGICAL HISTORY     multiple nodule removal on neck, hands, left knee  . SHOULDER ARTHROSCOPY WITH ROTATOR CUFF REPAIR AND SUBACROMIAL DECOMPRESSION Right 05/20/2014   Procedure: RIGHT SHOULDER ARTHROSCOPY WITH DEBRIDEMENT/DISTAL CLAVICLE EXCISION/ROTATOR CUFF REPAIR AND SUBACROMIAL DECOMPRESSION;  Surgeon: Lorn Junes, MD;  Location: [facility];  Service: Orthopedics;  Laterality: Right;  . TONSILLECTOMY    . TUBAL LIGATION     bilateral    OB History    Gravida Para Term Preterm AB Living   _0 0 1 2   SAB TAB Ectopic Multiple Live Births  1 0 0 0         Home Medications    Prior to Admission medications   Medication Sig Start Date End Date Taking? Authorizing Provider  abatacept (ORENCIA) 250 MG injection Inject 1,000 mg into the vein every 28 (twenty-eight) days. Monthly Infusion   Yes Historical Provider, MD  albuterol (PROVENTIL HFA;VENTOLIN HFA) 108 (90 Base) MCG/ACT inhaler Inhale 2 puffs into the lungs every 6 (six) hours as needed for wheezing or shortness of breath.   Yes Historical Provider, MD  amitriptyline (ELAVIL) 25 MG tablet Take 25 mg by mouth at bedtime.    Yes Historical Provider, MD  ARTIFICIAL TEAR OP Place 1 drop into both eyes as needed (for dry eyes).   Yes Historical Provider, MD    chlorthalidone (HYGROTON) 25 MG tablet Take 25 mg by mouth daily.   Yes Historical Provider, MD  CRESTOR 20 MG tablet Take 20 mg by mouth daily.  03/14/14  Yes Historical Provider, MD  diltiazem (CARDIZEM CD) 240 MG 24 hr capsule Take 240 mg by mouth daily.  02/03/14  Yes Historical Provider, MD  fluticasone (FLONASE) 50 MCG/ACT nasal spray Place 1 spray into both nostrils daily as needed for allergies or rhinitis.   Yes Historical Provider, MD  gabapentin (NEURONTIN) 300 MG capsule Take 600 mg by mouth 3 (three) times daily.    Yes Historical Provider, MD  insulin aspart (NOVOLOG) 100 UNIT/ML injection Inject 55-60 Units into the skin 3 (three) times daily before meals. 55 base Plus sliding scale   Yes Historical Provider, MD  insulin NPH (HUMULIN N,NOVOLIN N) 100 UNIT/ML injection Inject 75 Units into the skin 3 (three) times daily with meals. 75 units with breakfast; lunch 75 units; 75 units at dinner   Yes Historical Provider, MD  irbesartan (AVAPRO) 300 MG tablet Take 300 mg by mouth daily.    Yes Historical Provider, MD  leflunomide (ARAVA) 20 MG tablet Take 20 mg by mouth daily.   Yes Historical Provider, MD  metFORMIN (GLUCOPHAGE-XR) 500 MG 24 hr tablet Take 500 mg by mouth every evening.   Yes Historical Provider, MD  methocarbamol (ROBAXIN) 500 MG tablet Take 500 mg by mouth every 8 (eight) hours as needed for muscle spasms.   Yes Historical Provider, MD  Nebivolol HCl (BYSTOLIC) 20 MG TABS Take 20 mg by mouth daily.    Yes Historical Provider, MD  pantoprazole (PROTONIX) 40 MG tablet Take 40 mg by mouth daily.   Yes Historical Provider, MD  predniSONE (DELTASONE) 5 MG tablet Take 5 mg by mouth daily.   Yes Historical Provider, MD  tiZANidine (ZANAFLEX) 4 MG tablet Take 4 mg by mouth 2 (two) times daily as needed (migraines). Limit 2 days per week 01/12/14  Yes Historical Provider, MD  topiramate (TOPAMAX) 100 MG tablet Take 200 mg by mouth daily.  03/14/14  Yes Historical Provider, MD   TRULICITY 8.88 BV/6.9IH SOPN Inject 0.75 mg into the skin once a week. Patient takes on Wednesday. 08/29/15  Yes Historical Provider, MD  VOLTAREN 1 % GEL Apply 1 application topically 3 (three) times daily as needed (for pain).  02/03/14  Yes Historical Provider, MD    Family History Family History  Problem Relation Age of Onset  . Diabetes Mother   . Cancer Mother     breast  . Aneurysm Father     brain  . Diabetes Sister   . Cancer Brother     bone marrow cancer    Social  History Social History  Substance Use Topics  . Smoking status: Former Smoker    Packs/day: 0.25    Years: 6.00    Types: Cigarettes    Quit date: 06/09/2013  . Smokeless tobacco: Never Used  . Alcohol use No     Allergies   Bactrim [sulfamethoxazole-trimethoprim]; Cefuroxime axetil; Cephalosporins; Iohexol; Lisinopril; Penicillins; and Sulfa antibiotics   Review of Systems Review of Systems  Constitutional: Negative for chills, fatigue and fever.  HENT: Negative for congestion and rhinorrhea.   Eyes: Negative for visual disturbance.  Respiratory: Negative for cough, shortness of breath and wheezing.   Cardiovascular: Negative for chest pain and leg swelling.  Gastrointestinal: Negative for abdominal pain, diarrhea, nausea and vomiting.  Genitourinary: Negative for dysuria and flank pain.  Musculoskeletal: Positive for gait problem and joint swelling. Negative for neck pain and neck stiffness.  Skin: Negative for rash and wound.  Allergic/Immunologic: Negative for immunocompromised state.  Neurological: Negative for syncope, weakness and headaches.  All other systems reviewed and are negative.    Physical Exam Updated Vital Signs BP 166/92   Pulse 92   Temp 98.1 F (36.7 C) (Oral)   Resp 16   Wt 298 lb 8 oz (135.4 kg)   LMP 12/27/2013 Comment: irreggular  SpO2 92%   BMI 52.88 kg/m   Physical Exam  Constitutional: She is oriented to person, place, and time. She appears  well-developed and well-nourished. No distress.  HENT:  Head: Normocephalic and atraumatic.  Mouth/Throat: Oropharynx is clear and moist.  Eyes: Conjunctivae are normal. Pupils are equal, round, and reactive to light.  Neck: Neck supple.  Cardiovascular: Normal rate, regular rhythm and normal heart sounds.  Exam reveals no friction rub.   No murmur heard. Pulmonary/Chest: Effort normal and breath sounds normal. No respiratory distress. She has no wheezes. She has no rales.  Abdominal: She exhibits no distension. There is no tenderness.  Musculoskeletal: She exhibits no edema.  Neurological: She is alert and oriented to person, place, and time. She exhibits normal muscle tone.  Skin: Skin is warm. Capillary refill takes less than 2 seconds.  Nursing note and vitals reviewed.   LOWER EXTREMITY EXAM: RIGHT  INSPECTION & PALPATION: Exquisite TTP with any ROM of leg at hip. Marked pain with passive internal external rotation of hip at foot (log roll). No skin changes. No warmth appreciated on exam.  SENSORY: sensation is intact to light touch in:  Superficial peroneal nerve distribution (over dorsum of foot) Deep peroneal nerve distribution (over first dorsal web space) Sural nerve distribution (over lateral aspect 5th metatarsal) Saphenous nerve distribution (over medial instep)  MOTOR:  + Motor EHL (great toe dorsiflexion) + FHL (great toe plantar flexion)  + TA (ankle dorsiflexion)  + GSC (ankle plantar flexion)  VASCULAR: 2+ dorsalis pedis and posterior tibialis pulses Capillary refill < 2 sec, toes warm and well-perfused  COMPARTMENTS: Soft, warm, well-perfused No pain with passive extension No parethesias  ED Treatments / Results  Labs (all labs ordered are listed, but only abnormal results are displayed) Labs Reviewed  CBC WITH DIFFERENTIAL/PLATELET - Abnormal; Notable for the following:       Result Value   WBC 11.9 (*)    Hemoglobin 10.2 (*)    HCT 33.9 (*)     MCV 66.7 (*)    MCH 20.1 (*)    RDW 16.0 (*)    Neutro Abs 10.1 (*)    All other components within normal limits  BASIC METABOLIC PANEL -  Abnormal; Notable for the following:    Glucose, Bld 402 (*)    All other components within normal limits  SEDIMENTATION RATE - Abnormal; Notable for the following:    Sed Rate 31 (*)    All other components within normal limits  C-REACTIVE PROTEIN - Abnormal; Notable for the following:    CRP 1.1 (*)    All other components within normal limits  URINALYSIS, ROUTINE W REFLEX MICROSCOPIC (NOT AT [facility]) - Abnormal; Notable for the following:    APPearance HAZY (*)    Glucose, UA >1000 (*)    Protein, ur 30 (*)    All other components within normal limits  URINE MICROSCOPIC-ADD ON - Abnormal; Notable for the following:    Squamous Epithelial / LPF 6-30 (*)    Bacteria, UA FEW (*)    All other components within normal limits    EKG  EKG Interpretation None       Radiology Ct Hip Right Wo Contrast  Result Date: 01/19/2016 CLINICAL DATA:  Acute onset of stabbing right hip pain last night. EXAM: CT OF THE RIGHT HIP WITHOUT CONTRAST TECHNIQUE: Multidetector CT imaging of the right hip was performed according to the standard protocol. Multiplanar CT image reconstructions were also generated. COMPARISON:  Radiographs dated 01/19/2016 FINDINGS: Bones/Joint/Cartilage The bones of the right hip are normal. Slight arthritic changes of the right sacroiliac joint. The other visualized bones are normal. Ligaments Suboptimally assessed by CT.  Normal. Muscles and Tendons Normal. Soft tissues Normal. IMPRESSION: Normal right hip. Slight degenerative changes of the right sacroiliac joint. Electronically Signed   By: Lorriane Shire M.D.   On: 01/19/2016 15:01   Dg Hip Unilat W Or Wo Pelvis 2-3 Views Right  Result Date: 01/19/2016 CLINICAL DATA:  Right hip pain for 1 day.  No reported injury. EXAM: DG HIP (WITH OR WITHOUT PELVIS) 2-3V RIGHT COMPARISON:  03/01/2008  CT abdomen/pelvis FINDINGS: No fracture or dislocation in the right hip. Mild osteoarthritis in the weight-bearing portions of both hip joints. No pelvic fracture or diastasis. No suspicious focal osseous lesions. Degenerative changes in the visualized lower lumbar spine. IMPRESSION: No fracture or malalignment. Mild osteoarthritis in the weight-bearing portions of both hip joints. Electronically Signed   By: Ilona Sorrel M.D.   On: 01/19/2016 13:01    Procedures Procedures (including critical care time)  Medications Ordered in ED Medications  morphine 4 MG/ML injection 4 mg (4 mg Intravenous Given 01/19/16 1354)  ketorolac (TORADOL) 30 MG/ML injection 15 mg (15 mg Intravenous Given 01/19/16 1552)  HYDROcodone-acetaminophen (NORCO/VICODIN) 5-325 MG per tablet 2 tablet (2 tablets Oral Given 01/19/16 1552)  HYDROmorphone (DILAUDID) injection 1 mg (1 mg Intravenous Given 01/19/16 1708)     Initial Impression / Assessment and Plan / ED Course  I have reviewed the triage vital signs and the nursing notes.  Pertinent labs & imaging results that were available during my care of the patient were reviewed by me and considered in my medical decision making (see chart for details).  55 yo F with PMHx of obesity, RA on chronic prednisone and infusion therapy who presents with atraumatic, severe right hip pain. Pt has exquisite TTP with pROM of hip and cannot bear weight. VSS and WNL. No signs of sepsis or systemic illness. Lab work shows leukocytosis, mild elevation of ESR and CRP. Plain films show no fx, as well as CT scan. Primary DDx remains possible RA arthritis of hip, but must also consider septic arthritis as pt is  immune suppressed and has elevated inflammatory markers. Less likely AVN given normal CT scan. No signs of occult fx. DIscussed case with Dr. Sharol Given of Orthopedics, as well as Interventional Radiology. Pt will need IR-guided tap but radiology requires MRI prior to tap to assess presence of  effusion. Will admit to hospitalists for pain control, MRI, further work-up. Holding on ABX per Dr. Sharol Given until aspiration is obtained, as pt is o/w HDS.  Final Clinical Impressions(s) / ED Diagnoses   Final diagnoses:  Right hip pain  Immunosuppressed status (HCC)  Leukocytosis  Elevated erythrocyte sedimentation rate  Elevated C-reactive protein (CRP)    New Prescriptions New Prescriptions   No medications on file     Duffy Bruce, MD 01/20/16 1045

## 2016-01-19 NOTE — H&P (Signed)
[facility] History and Physical  Jessica Howard [id number] DOB: 1960/07/01 DOA: 01/19/2016  Referring physician:  PCP: Merrilee Seashore, MD  Specialists:   Chief Complaint: right hip pain   HPI: Jessica Howard is a 55 y.o. female with PMH of HTN, HPL, DM, OSA on CPAP, reported h/o rheumatoid arthritis on chronic prednisone presented with sudden onset of right side hip pain radiating to the groin area. She started experiencing severe right hip pain since last night, unable to ambulate due to pain. Denies previous history of hip pains, no trauma, no fall, no focal weakness or paresthesia, no fevers.  -ED: suspected questionable infection in the right hip. D/w Dr. Sharol Given who recommended IR drainage. Then IR requested to obtain MRI hip prior to attempted procedure. hospitalist called for observation overnight    Review of Systems: The patient denies anorexia, fever, weight loss,, vision loss, decreased hearing, hoarseness, chest pain, syncope, dyspnea on exertion, peripheral edema, balance deficits, hemoptysis, abdominal pain, melena, hematochezia, severe indigestion/heartburn, hematuria, incontinence, genital sores, muscle weakness, suspicious skin lesions, transient blindness, difficulty walking, depression, unusual weight change, abnormal bleeding, enlarged lymph nodes, angioedema, and breast masses.    Past Medical History:  Diagnosis Date  . Asthma   . Diabetes mellitus    type 2  . Diabetic neuropathy ([place])   . Endometrial polyp   . Fibroadenoma of breast    left  . GERD (gastroesophageal reflux disease)   . Headache    migraines  . Hepatic steatosis   . Hyperlipidemia   . Hypertension   . Impingement syndrome of right shoulder   . Nontraumatic tear of right rotator cuff   . Pneumonia   . Psoriasis   . Rheumatoid arthritis(714.0)    rhematoid and osteoarthritis  . Sleep apnea   . Thyroid disease    had radiation  . Unspecified deficiency anemia     Thalassemia   Past Surgical History:  Procedure Laterality Date  . ANKLE ARTHROSCOPY Right   . CESAREAN SECTION    . COLONOSCOPY    . DILATION AND CURETTAGE OF UTERUS    . EYE SURGERY     surgery on retina  . HAND SURGERY     for arthritis  . left knee surgery nodule removal    . OTHER SURGICAL HISTORY     multiple nodule removal on neck, hands, left knee  . SHOULDER ARTHROSCOPY WITH ROTATOR CUFF REPAIR AND SUBACROMIAL DECOMPRESSION Right 05/20/2014   Procedure: RIGHT SHOULDER ARTHROSCOPY WITH DEBRIDEMENT/DISTAL CLAVICLE EXCISION/ROTATOR CUFF REPAIR AND SUBACROMIAL DECOMPRESSION;  Surgeon: Lorn Junes, MD;  Location: [facility];  Service: Orthopedics;  Laterality: Right;  . TONSILLECTOMY    . TUBAL LIGATION     bilateral   Social History:  reports that she quit smoking about 2 years ago. Her smoking use included Cigarettes. She has a 1.50 pack-year smoking history. She has never used smokeless tobacco. She reports that she does not drink alcohol or use drugs. Home;  where does patient live--home, ALF, SNF? and with whom if at home? Yes;  Can patient participate in ADLs?  Allergies  Allergen Reactions  . Bactrim [Sulfamethoxazole-Trimethoprim] Hives  . Cefuroxime Axetil Itching  . Cephalosporins Itching  . Iohexol Itching and Rash     Code: RASH, Desc: HAD ITCHING AND A RASH ABOUT ONE HOUR AFTER RETURNING HOME FROM THE CT, Onset Date: [id number]   . Lisinopril Cough  . Penicillins Hives  . Sulfa Antibiotics Hives    Family History  Problem Relation  Age of Onset  . Diabetes Mother   . Cancer Mother     breast  . Aneurysm Father     brain  . Diabetes Sister   . Cancer Brother     bone marrow cancer    (be sure to complete)  Prior to Admission medications   Medication Sig Start Date End Date Taking? Authorizing Provider  abatacept (ORENCIA) 250 MG injection Inject 1,000 mg into the vein every 28 (twenty-eight) days. Monthly Infusion   Yes Historical Provider, MD   albuterol (PROVENTIL HFA;VENTOLIN HFA) 108 (90 Base) MCG/ACT inhaler Inhale 2 puffs into the lungs every 6 (six) hours as needed for wheezing or shortness of breath.   Yes Historical Provider, MD  amitriptyline (ELAVIL) 25 MG tablet Take 25 mg by mouth at bedtime.    Yes Historical Provider, MD  ARTIFICIAL TEAR OP Place 1 drop into both eyes as needed (for dry eyes).   Yes Historical Provider, MD  chlorthalidone (HYGROTON) 25 MG tablet Take 25 mg by mouth daily.   Yes Historical Provider, MD  CRESTOR 20 MG tablet Take 20 mg by mouth daily.  03/14/14  Yes Historical Provider, MD  diltiazem (CARDIZEM CD) 240 MG 24 hr capsule Take 240 mg by mouth daily.  02/03/14  Yes Historical Provider, MD  fluticasone (FLONASE) 50 MCG/ACT nasal spray Place 1 spray into both nostrils daily as needed for allergies or rhinitis.   Yes Historical Provider, MD  gabapentin (NEURONTIN) 300 MG capsule Take 600 mg by mouth 3 (three) times daily.    Yes Historical Provider, MD  insulin aspart (NOVOLOG) 100 UNIT/ML injection Inject 55-60 Units into the skin 3 (three) times daily before meals. 55 base Plus sliding scale   Yes Historical Provider, MD  insulin NPH (HUMULIN N,NOVOLIN N) 100 UNIT/ML injection Inject 75 Units into the skin 3 (three) times daily with meals. 75 units with breakfast; lunch 75 units; 75 units at dinner   Yes Historical Provider, MD  irbesartan (AVAPRO) 300 MG tablet Take 300 mg by mouth daily.    Yes Historical Provider, MD  leflunomide (ARAVA) 20 MG tablet Take 20 mg by mouth daily.   Yes Historical Provider, MD  metFORMIN (GLUCOPHAGE-XR) 500 MG 24 hr tablet Take 500 mg by mouth every evening.   Yes Historical Provider, MD  methocarbamol (ROBAXIN) 500 MG tablet Take 500 mg by mouth every 8 (eight) hours as needed for muscle spasms.   Yes Historical Provider, MD  Nebivolol HCl (BYSTOLIC) 20 MG TABS Take 20 mg by mouth daily.    Yes Historical Provider, MD  pantoprazole (PROTONIX) 40 MG tablet Take 40 mg by  mouth daily.   Yes Historical Provider, MD  predniSONE (DELTASONE) 5 MG tablet Take 5 mg by mouth daily.   Yes Historical Provider, MD  tiZANidine (ZANAFLEX) 4 MG tablet Take 4 mg by mouth 2 (two) times daily as needed (migraines). Limit 2 days per week 01/12/14  Yes Historical Provider, MD  topiramate (TOPAMAX) 100 MG tablet Take 200 mg by mouth daily.  03/14/14  Yes Historical Provider, MD  TRULICITY [id number] SOPN Inject 0.75 mg into the skin once a week. Patient takes on Wednesday. 08/29/15  Yes Historical Provider, MD  VOLTAREN 1 % GEL Apply 1 application topically 3 (three) times daily as needed (for pain).  02/03/14  Yes Historical Provider, MD   Physical Exam: Vitals:   01/19/16 1630 01/19/16 1700  BP: 144/77 166/92  Pulse: 95 92  Resp:  Temp:       General:  Alert, no distress   Eyes: eom-I, perla   ENT: no oral; ulcers   Neck: supple  Cardiovascular: s1,s2 rrr  Respiratory: CTA BL  Abdomen: soft, nt, nd   Skin: no rash   Musculoskeletal: mild right hip tenderness   Psychiatric: no hallucinations   Neurologic: CN 2-12 intact, motor right leg mild decreased due to pain   Labs on Admission:  Basic Metabolic Panel:  Recent Labs Lab 01/19/16 1350  NA 136  K 4.7  CL 101  CO2 24  GLUCOSE 402*  BUN 19  CREATININE 0.94  CALCIUM 9.1   Liver Function Tests: No results for input(s): AST, ALT, ALKPHOS, BILITOT, PROT, ALBUMIN in the last 168 hours. No results for input(s): LIPASE, AMYLASE in the last 168 hours. No results for input(s): AMMONIA in the last 168 hours. CBC:  Recent Labs Lab 01/17/16 1315 01/19/16 1350  WBC 8.1 11.9*  NEUTROABS 4.8 10.1*  HGB 9.8* 10.2*  HCT 31.3* 33.9*  MCV 66.6* 66.7*  PLT 276 303   Cardiac Enzymes: No results for input(s): CKTOTAL, CKMB, CKMBINDEX, TROPONINI in the last 168 hours.  BNP (last 3 results)  Recent Labs  12/01/15 0204  BNP 163.2*    ProBNP (last 3 results) No results for input(s): PROBNP in  the last 8760 hours.  CBG: No results for input(s): GLUCAP in the last 168 hours.  Radiological Exams on Admission: Ct Hip Right Wo Contrast  Result Date: 01/19/2016 CLINICAL DATA:  Acute onset of stabbing right hip pain last night. EXAM: CT OF THE RIGHT HIP WITHOUT CONTRAST TECHNIQUE: Multidetector CT imaging of the right hip was performed according to the standard protocol. Multiplanar CT image reconstructions were also generated. COMPARISON:  Radiographs dated 01/19/2016 FINDINGS: Bones/Joint/Cartilage The bones of the right hip are normal. Slight arthritic changes of the right sacroiliac joint. The other visualized bones are normal. Ligaments Suboptimally assessed by CT.  Normal. Muscles and Tendons Normal. Soft tissues Normal. IMPRESSION: Normal right hip. Slight degenerative changes of the right sacroiliac joint. Electronically Signed   By: Lorriane Shire M.D.   On: 01/19/2016 15:01   Dg Hip Unilat W Or Wo Pelvis 2-3 Views Right  Result Date: 01/19/2016 CLINICAL DATA:  Right hip pain for 1 day.  No reported injury. EXAM: DG HIP (WITH OR WITHOUT PELVIS) 2-3V RIGHT COMPARISON:  03/01/2008 CT abdomen/pelvis FINDINGS: No fracture or dislocation in the right hip. Mild osteoarthritis in the weight-bearing portions of both hip joints. No pelvic fracture or diastasis. No suspicious focal osseous lesions. Degenerative changes in the visualized lower lumbar spine. IMPRESSION: No fracture or malalignment. Mild osteoarthritis in the weight-bearing portions of both hip joints. Electronically Signed   By: Ilona Sorrel M.D.   On: 01/19/2016 13:01    EKG: Independently reviewed.   Assessment/Plan Active Problems:   Right hip pain   55 y.o. female with PMH of HTN, HPL, DM, OSA on CPAP, reported h/o rheumatoid arthritis on chronic prednisone presented with sudden onset of right side hip pain radiating to the groin area.  Right hip pain, tenderness. unclear etiology. CT hip: no acute findings. ED thought  possible infection and d/w Dr. Sharol Given who recommended IR drainage. Then IR requested to obtain MRI hip prior to attempted procedure -awaiting mri hip. Will hold antibiotics for now, afebrile, no significant leukocytosis and CRP-1.1  h/o rheumatoid arthritis on chronic prednisone. No s/s of active synovitis on exam.  HTN.  DM. ha1c-10.0 (11/2015).  Hold metformin due to contrast exposure. Cont NPH+ISS. Resume mealtime insulin if no procedure planned in AM  OSA on CPAP HTN. Resume home regimen. Monitor   Ortho, IR.  if consultant consulted, please document name and whether formally or informally consulted  Code Status: full (must indicate code status--if unknown or must be presumed, indicate so) Family Communication: d/w patient, her sister (indicate person spoken with, if applicable, with phone number if by telephone) Disposition Plan: pend PT  (indicate anticipated LOS)  Time spent: >35 minutes   Kinnie Feil [facility] Pager [phone]  If 7PM-7AM, please contact night-coverage [email or web address] Password [facility] 01/19/2016, 5:54 PM

## 2016-01-19 NOTE — ED Notes (Signed)
Patient and family updated on plan of care by MD and this RN.  Patient and family aware that patient is NPO until IR is done with their joint tap

## 2016-01-19 NOTE — Progress Notes (Signed)
Patient arrived on the unit this hour. No acute distress noted. Able to help self in and out of bed but with moderate pain to right hip. Alert, oriented and can make needs known. Will continue to monitor.

## 2016-01-19 NOTE — ED Notes (Addendum)
Triage nurse Amy, notified.

## 2016-01-19 NOTE — ED Triage Notes (Signed)
Rt lower pain  In hip and groin , hurts tol ift leg started last night, hurts to bear wt and walk no injury

## 2016-01-19 NOTE — ED Notes (Signed)
Attempted report x1. 

## 2016-01-19 NOTE — ED Notes (Signed)
Pt assessed by PA and determined to need further workup. Willl move to lobby to wait for acute bed.

## 2016-01-20 ENCOUNTER — Observation Stay (HOSPITAL_COMMUNITY): Payer: Medicare Other

## 2016-01-20 DIAGNOSIS — K760 Fatty (change of) liver, not elsewhere classified: Secondary | ICD-10-CM | POA: Diagnosis present

## 2016-01-20 DIAGNOSIS — E785 Hyperlipidemia, unspecified: Secondary | ICD-10-CM | POA: Diagnosis present

## 2016-01-20 DIAGNOSIS — Z87891 Personal history of nicotine dependence: Secondary | ICD-10-CM | POA: Diagnosis not present

## 2016-01-20 DIAGNOSIS — R7982 Elevated C-reactive protein (CRP): Secondary | ICD-10-CM | POA: Diagnosis not present

## 2016-01-20 DIAGNOSIS — G4733 Obstructive sleep apnea (adult) (pediatric): Secondary | ICD-10-CM | POA: Diagnosis present

## 2016-01-20 DIAGNOSIS — M069 Rheumatoid arthritis, unspecified: Secondary | ICD-10-CM | POA: Diagnosis present

## 2016-01-20 DIAGNOSIS — Z833 Family history of diabetes mellitus: Secondary | ICD-10-CM | POA: Diagnosis not present

## 2016-01-20 DIAGNOSIS — M25451 Effusion, right hip: Secondary | ICD-10-CM | POA: Diagnosis not present

## 2016-01-20 DIAGNOSIS — D72829 Elevated white blood cell count, unspecified: Secondary | ICD-10-CM

## 2016-01-20 DIAGNOSIS — M25459 Effusion, unspecified hip: Secondary | ICD-10-CM | POA: Diagnosis present

## 2016-01-20 DIAGNOSIS — Z7951 Long term (current) use of inhaled steroids: Secondary | ICD-10-CM | POA: Diagnosis not present

## 2016-01-20 DIAGNOSIS — M25551 Pain in right hip: Secondary | ICD-10-CM | POA: Diagnosis not present

## 2016-01-20 DIAGNOSIS — J45909 Unspecified asthma, uncomplicated: Secondary | ICD-10-CM | POA: Diagnosis present

## 2016-01-20 DIAGNOSIS — Z79899 Other long term (current) drug therapy: Secondary | ICD-10-CM | POA: Diagnosis not present

## 2016-01-20 DIAGNOSIS — Z7952 Long term (current) use of systemic steroids: Secondary | ICD-10-CM | POA: Diagnosis not present

## 2016-01-20 DIAGNOSIS — Z6841 Body Mass Index (BMI) 40.0 and over, adult: Secondary | ICD-10-CM | POA: Diagnosis not present

## 2016-01-20 DIAGNOSIS — E1140 Type 2 diabetes mellitus with diabetic neuropathy, unspecified: Secondary | ICD-10-CM | POA: Diagnosis present

## 2016-01-20 DIAGNOSIS — Z809 Family history of malignant neoplasm, unspecified: Secondary | ICD-10-CM | POA: Diagnosis not present

## 2016-01-20 DIAGNOSIS — R700 Elevated erythrocyte sedimentation rate: Secondary | ICD-10-CM | POA: Diagnosis not present

## 2016-01-20 DIAGNOSIS — I10 Essential (primary) hypertension: Secondary | ICD-10-CM | POA: Diagnosis present

## 2016-01-20 DIAGNOSIS — M659 Synovitis and tenosynovitis, unspecified: Secondary | ICD-10-CM | POA: Diagnosis present

## 2016-01-20 DIAGNOSIS — Z794 Long term (current) use of insulin: Secondary | ICD-10-CM | POA: Diagnosis not present

## 2016-01-20 DIAGNOSIS — K219 Gastro-esophageal reflux disease without esophagitis: Secondary | ICD-10-CM | POA: Diagnosis present

## 2016-01-20 LAB — GLUCOSE, CAPILLARY
GLUCOSE-CAPILLARY: 290 mg/dL — AB (ref 65–99)
Glucose-Capillary: 287 mg/dL — ABNORMAL HIGH (ref 65–99)
Glucose-Capillary: 208 mg/dL — ABNORMAL HIGH (ref 65–99)
Glucose-Capillary: 111 mg/dL — ABNORMAL HIGH (ref 65–99)
Glucose-Capillary: 65 mg/dL (ref 65–99)

## 2016-01-20 MED ORDER — SUMATRIPTAN SUCCINATE 25 MG PO TABS
25.0000 mg | ORAL_TABLET | ORAL | Status: DC | PRN
  Filled 2016-01-20: qty 1

## 2016-01-20 MED ORDER — MORPHINE SULFATE (PF) 2 MG/ML IV SOLN
2.0000 mg | INTRAVENOUS | Status: DC | PRN
  Administered 2016-01-21 (×3): 2 mg via INTRAVENOUS
  Filled 2016-01-20 (×3): qty 1

## 2016-01-20 MED ORDER — IOPAMIDOL (ISOVUE-M 200) INJECTION 41%
INTRAMUSCULAR | Status: AC
  Filled 2016-01-20: qty 10

## 2016-01-20 MED ORDER — SODIUM CHLORIDE 0.9 % IV SOLN
1250.0000 mg | Freq: Two times a day (BID) | INTRAVENOUS | Status: DC
Start: 2016-01-20 — End: 2016-01-21
  Administered 2016-01-20 – 2016-01-21 (×2): 1250 mg via INTRAVENOUS
  Filled 2016-01-20 (×3): qty 1250

## 2016-01-20 MED ORDER — SODIUM CHLORIDE 0.9 % IV SOLN
INTRAVENOUS | Status: DC
  Administered 2016-01-20 – 2016-01-21 (×2): via INTRAVENOUS

## 2016-01-20 MED ORDER — IOPAMIDOL (ISOVUE-300) INJECTION 61%
INTRAVENOUS | Status: AC
  Filled 2016-01-20: qty 50

## 2016-01-20 MED ORDER — SODIUM CHLORIDE 0.9 % IJ SOLN
INTRAMUSCULAR | Status: AC
  Filled 2016-01-20: qty 20

## 2016-01-20 MED ORDER — LEVOFLOXACIN IN D5W 750 MG/150ML IV SOLN
750.0000 mg | INTRAVENOUS | Status: DC
  Administered 2016-01-20: 750 mg via INTRAVENOUS
  Filled 2016-01-20 (×2): qty 150

## 2016-01-20 MED ORDER — VANCOMYCIN HCL 10 G IV SOLR
2000.0000 mg | Freq: Once | INTRAVENOUS | Status: AC
  Administered 2016-01-20: 2000 mg via INTRAVENOUS
  Filled 2016-01-20: qty 2000

## 2016-01-20 MED ORDER — DIATRIZOATE MEGLUMINE & SODIUM 66-10 % PO SOLN
ORAL | Status: AC
  Filled 2016-01-20: qty 30

## 2016-01-20 NOTE — Progress Notes (Signed)
PROGRESS NOTE    ASHE BENGSTON  [id number] DOB: 10/23/1960 DOA: 01/19/2016 PCP: Merrilee Seashore, MD   Brief Narrative:  Jessica Howard is a 55 y.o. female with PMH of HTN, HPL, DM, OSA on CPAP, reported h/o rheumatoid arthritis on chronic prednisone presented with sudden onset of right side hip pain radiating to the groin area. She started experiencing severe right hip pain 2 days ago, unable to ambulate due to pain. Denies previous history of hip pains, no trauma, no fall, no focal weakness or paresthesia, no fevers.  In the ED patient was worked up for suspected questionable infection in the right hip. D/w Dr. Sharol Given who recommended IR drainage. Then IR requested to obtain MRI hip prior to attempted procedure.  MRI showed small R hip joint effusion with mild synovitis.  Assessment & Plan:   Active Problems:   Right hip pain   Hip pain   55 y.o. female with PMH of HTN, HPL, DM, OSA on CPAP, reported h/o rheumatoid arthritis on chronic prednisone presented with sudden onset of right side hip pain radiating to the groin area.  Right hip pain - MRI shows small right hip effusion with mild synovitis - patient to undergo hip aspiration today - cell count and differential, gram stain and culture ordered for fluid analysis - discussed with Dr. Sharol Given - will notify him when procedure is done and with prelim results of micro - started Vancomycin (could not start Zosyn as patient is Penicillin and cephalosporin allergic) - patient remains afebrile, normotensive and not tachycardic or tachypneic - lovenox d/c'ed until procedure and after Gram stain comes back - patient placed NPO   h/o rheumatoid arthritis on chronic prednisone - mild synovitis noted on MRI - to undergo aspiration of right hip joint today  HTN - continue patients chlorthalidone 25mg  daily, Diltiazem 240mg  daily and Bystolic 20mg  daily - blood pressures elevated this morning after patient walked to the  bathroom   DM. - hgba1c-10.0 (11/2015) - Continue to hold metformin due to contrast exposure - Cont NPH+ISS - could not restart mealtime insulinas patient is to get procedure today  OSA on CPAP - used CPAP last night    DVT prophylaxis: none at this time- patient to get procedure today Code Status: full Family Communication: no family present  Disposition Plan: home when medically stable   Consultants:   Interventional radiology  Orthopaedics  Procedures:   Fluoro guided right hip aspiration  Antimicrobials:   Vancomycin  Will need to consider additional antibiotic pending results of aspiration. As patient is allergic to penicillins and cephalosporins we were unable to initiate Zosyn    Subjective: Patient is laying back in bed.  She voices that she feels like her hip pain is only mildly better.  She said this morning she was able to hobble to the bathroom but otherwise her pain is still fairly significant.  She denies any precipitating trauma to the hip. No fevers overnight and no chills.  Patient has been on prednisone for years she states because of her RA.    Objective: Vitals:   01/19/16 1800 01/19/16 1815 01/19/16 2041 01/20/16 0640  BP: 126/67 128/66 (!) 151/69 (!) 153/68  Pulse: 94 89 90 83  Resp:   17 16  Temp:   98.3 F (36.8 C) 97.6 F (36.4 C)  TempSrc:   Oral Oral  SpO2: 96% 91% 97% 99%  Weight:       No intake or output data in the 24 hours ending  01/20/16 1050 Filed Weights   01/19/16 1116  Weight: 135.4 kg (298 lb 8 oz)    Examination:  General exam: Appears calm and comfortable  Respiratory system: Clear to auscultation. Respiratory effort normal. Cardiovascular system: S1 & S2 heard, RRR. No JVD, murmurs, rubs, gallops or clicks. No pedal edema. Gastrointestinal system: Abdomen is obese, soft and nontender. No organomegaly or masses felt. Normal bowel sounds heard. No cervical or inguinal lymphadenopathy noted Central nervous system:  Alert and oriented. No focal neurological deficits. Extremities: Symmetric 5 x 5 power in upper extremities. Pain with passive ROM of right hip (pain most intense with external rotation, flexion at the hip, and pressure applied to foot when standing), no swelling of any joints, could no perform Trendelenberg test 2/2 pain, 5/5 power at knees and ankles bilaterally Skin: No rashes, lesions or ulcers Psychiatry: Judgement and insight appear normal. Mood & affect appropriate.     Data Reviewed: I have personally reviewed following labs and imaging studies  CBC:  Recent Labs Lab 01/17/16 1315 01/19/16 1350  WBC 8.1 11.9*  NEUTROABS 4.8 10.1*  HGB 9.8* 10.2*  HCT 31.3* 33.9*  MCV 66.6* 66.7*  PLT 276 [id number]   Basic Metabolic Panel:  Recent Labs Lab 01/19/16 1350  NA 136  K 4.7  CL 101  CO2 24  GLUCOSE 402*  BUN 19  CREATININE 0.94  CALCIUM 9.1   GFR: Estimated Creatinine Clearance: 93.5 mL/min (by C-G formula based on SCr of 0.94 mg/dL). Liver Function Tests: No results for input(s): AST, ALT, ALKPHOS, BILITOT, PROT, ALBUMIN in the last 168 hours. No results for input(s): LIPASE, AMYLASE in the last 168 hours. No results for input(s): AMMONIA in the last 168 hours. Coagulation Profile: No results for input(s): INR, PROTIME in the last 168 hours. Cardiac Enzymes: No results for input(s): CKTOTAL, CKMB, CKMBINDEX, TROPONINI in the last 168 hours. BNP (last 3 results) No results for input(s): PROBNP in the last 8760 hours. HbA1C: No results for input(s): HGBA1C in the last 72 hours. CBG:  Recent Labs Lab 01/20/16 0633  GLUCAP 287*   Lipid Profile: No results for input(s): CHOL, HDL, LDLCALC, TRIG, CHOLHDL, LDLDIRECT in the last 72 hours. Thyroid Function Tests: No results for input(s): TSH, T4TOTAL, FREET4, T3FREE, THYROIDAB in the last 72 hours. Anemia Panel: No results for input(s): VITAMINB12, FOLATE, FERRITIN, TIBC, IRON, RETICCTPCT in the last 72 hours. Sepsis  Labs: No results for input(s): PROCALCITON, LATICACIDVEN in the last 168 hours.  No results found for this or any previous visit (from the past 240 hour(s)).       Radiology Studies: Mr Hip Right W Wo Contrast  Result Date: 01/20/2016 CLINICAL DATA:  Sudden onset right groin pain and right hip pain starting last night. History of rheumatoid arthritis. EXAM: MRI OF THE RIGHT HIP WITHOUT AND WITH CONTRAST TECHNIQUE: Multiplanar, multisequence MR imaging was performed both before and after administration of intravenous contrast. CONTRAST:  34mL MULTIHANCE GADOBENATE DIMEGLUMINE 529 MG/ML IV SOLN COMPARISON:  01/19/2016 FINDINGS: Bones: Unremarkable Articular cartilage and labrum Articular cartilage: Moderate degenerative chondral thinning in both hips. Labrum:  No gross labral tear identified. Joint or bursal effusion Joint effusion: Mild right hip joint effusion with mild synovial enhancement. No bony erosion. The amount of fluid in the left hip joint is upper normal. Bursae:  No bursitis Muscles and tendons Muscles and tendons:  Unremarkable. Other findings Miscellaneous: Uterus, ovaries, an urinary bladder unremarkable. No distal hydroureter. IMPRESSION: 1. Small right hip joint effusion with  mild synovitis. This could be a manifestation of rheumatoid arthropathy but is generally nonspecific. 2. Moderate degenerative chondral thinning in both hips. 3. No distinct erosions.  Sacroiliac joints unremarkable. Electronically Signed   By: Van Clines M.D.   On: 01/20/2016 08:45   Ct Hip Right Wo Contrast  Result Date: 01/19/2016 CLINICAL DATA:  Acute onset of stabbing right hip pain last night. EXAM: CT OF THE RIGHT HIP WITHOUT CONTRAST TECHNIQUE: Multidetector CT imaging of the right hip was performed according to the standard protocol. Multiplanar CT image reconstructions were also generated. COMPARISON:  Radiographs dated 01/19/2016 FINDINGS: Bones/Joint/Cartilage The bones of the right hip are  normal. Slight arthritic changes of the right sacroiliac joint. The other visualized bones are normal. Ligaments Suboptimally assessed by CT.  Normal. Muscles and Tendons Normal. Soft tissues Normal. IMPRESSION: Normal right hip. Slight degenerative changes of the right sacroiliac joint. Electronically Signed   By: Lorriane Shire M.D.   On: 01/19/2016 15:01   Dg Hip Unilat W Or Wo Pelvis 2-3 Views Right  Result Date: 01/19/2016 CLINICAL DATA:  Right hip pain for 1 day.  No reported injury. EXAM: DG HIP (WITH OR WITHOUT PELVIS) 2-3V RIGHT COMPARISON:  03/01/2008 CT abdomen/pelvis FINDINGS: No fracture or dislocation in the right hip. Mild osteoarthritis in the weight-bearing portions of both hip joints. No pelvic fracture or diastasis. No suspicious focal osseous lesions. Degenerative changes in the visualized lower lumbar spine. IMPRESSION: No fracture or malalignment. Mild osteoarthritis in the weight-bearing portions of both hip joints. Electronically Signed   By: Ilona Sorrel M.D.   On: 01/19/2016 13:01        Scheduled Meds: . amitriptyline  25 mg Oral QHS  . chlorthalidone  25 mg Oral Daily  . diltiazem  240 mg Oral Daily  . gabapentin  600 mg Oral TID  . insulin aspart  0-15 Units Subcutaneous TID WC  . insulin aspart  0-5 Units Subcutaneous QHS  . insulin NPH Human  75 Units Subcutaneous TID WC  . leflunomide  20 mg Oral Daily  . nebivolol  20 mg Oral Daily  . pantoprazole  40 mg Oral Daily  . predniSONE  5 mg Oral Daily  . rosuvastatin  20 mg Oral Daily  . topiramate  200 mg Oral Daily   Continuous Infusions:    LOS: 0 days    Time spent: 45 minutes    Newman Pies, MD [facility] Pager [phone]  If 7PM-7AM, please contact night-coverage [email or web address] Password [email or web address] 01/20/2016, 10:50 AM

## 2016-01-20 NOTE — Progress Notes (Signed)
Pharmacy Antibiotic Note  Jessica Howard is a 55 y.o. female admitted on 01/19/2016 with Hip Infection.  Pharmacy has been consulted for Vancomycin and Zosyn dosing. Patient noted to have PCN and Cephalosporin allergy of Hives and itching- Zosyn discontinued due to allergy.   Normalized CrCl ~90-95 mL/min.  MRI (8/11) shows small right hip joint effusion with mild synovitis Consult to IR for possible drainage.  Currently afebrile with WBC 11.9. Elevated ESR/CRP.  PMH significant for RA but NO prior surgery to hip noted.   Plan: Vancomycin 2037m IV x1 then 12564mIV every 12 hours.   Weight: 298 lb 8 oz (135.4 kg)  Temp (24hrs), Avg:98 F (36.7 C), Min:97.6 F (36.4 C), Max:98.3 F (36.8 C)   Recent Labs Lab 01/17/16 1315 01/19/16 1350  WBC 8.1 11.9*  CREATININE  --  0.94    Estimated Creatinine Clearance: 93.5 mL/min (by C-G formula based on SCr of 0.94 mg/dL).    Allergies  Allergen Reactions  . Bactrim [Sulfamethoxazole-Trimethoprim] Hives  . Cefuroxime Axetil Itching  . Cephalosporins Itching  . Iohexol Itching and Rash     Code: RASH, Desc: HAD ITCHING AND A RASH ABOUT ONE HOUR AFTER RETURNING HOME FROM THE CT, Onset Date: [id number] . Lisinopril Cough  . Penicillins Hives  . Sulfa Antibiotics Hives    Antimicrobials this admission: Vancomycin 8/12 >>  Dose adjustments this admission: n/a  Microbiology results:  BCx:   UCx:   Sputum:   MRSA PCR:   Thank you for allowing pharmacy to be a part of this patient's care.  JeSloan LeiterPharmD, BCPS Clinical Pharmacist [phone]/05/2016 10:39 AM

## 2016-01-20 NOTE — Evaluation (Signed)
Physical Therapy Evaluation Patient Details Name: Jessica Howard MRN: [id number] DOB: 01-16-1961 Today's Date: 01/20/2016   History of Present Illness  Pt is a 55 y/o female presenting with R hip and groin pain. PMH including but not limited to RA, HTN, DM, R rotator cuff repair in 2015.  Clinical Impression  Pt presented supine in bed with HOB elevated, awake and willing to participate in therapy session. Pt was independent prior to admission. Pt stating that her performance during evaluation was her baseline. Pt with no LOB or difficulties with functional mobility during evaluation. No further acute physical therapy needs at this time.    Follow Up Recommendations No PT follow up    Equipment Recommendations  None recommended by PT    Recommendations for Other Services       Precautions / Restrictions Precautions Precautions: None Restrictions Weight Bearing Restrictions: No      Mobility  Bed Mobility Overal bed mobility: Needs Assistance Bed Mobility: Supine to Sit     Supine to sit: Supervision     General bed mobility comments: pt utilized bedrails  Transfers Overall transfer level: Needs assistance Equipment used: None Transfers: Sit to/from Stand Sit to Stand: Min guard         General transfer comment: pt required increased time to complete  Ambulation/Gait Ambulation/Gait assistance: Supervision Ambulation Distance (Feet): 150 Feet Assistive device: None Gait Pattern/deviations: Decreased stride length Gait velocity: decreased Gait velocity interpretation: Below normal speed for age/gender    Stairs            Wheelchair Mobility    Modified Rankin (Stroke Patients Only)       Balance Overall balance assessment: Independent                               Standardized Balance Assessment Standardized Balance Assessment : Dynamic Gait Index   Dynamic Gait Index Level Surface: Mild Impairment Change in Gait Speed:  Moderate Impairment Gait with Horizontal Head Turns: Normal Gait with Vertical Head Turns: Normal Gait and Pivot Turn: Normal Step Over Obstacle: Normal Step Around Obstacles: Normal Steps: Mild Impairment Total Score: 20       Pertinent Vitals/Pain Pain Assessment: Faces Faces Pain Scale: Hurts a little bit Pain Location: R hip, R groin Pain Descriptors / Indicators: Discomfort;Sore Pain Intervention(s): Monitored during session    Home Living Family/patient expects to be discharged to:: Private residence     Type of Home: House Home Access: Stairs to enter Entrance Stairs-Rails: Right;Left;Can reach both Technical brewer of Steps: 3 Home Layout: One level Home Equipment: None      Prior Function Level of Independence: Independent               Hand Dominance        Extremity/Trunk Assessment   Upper Extremity Assessment: Overall WFL for tasks assessed           Lower Extremity Assessment: RLE deficits/detail RLE Deficits / Details: Pt with decreased strength in R LE secondary to pain. MMT: 3+/5 for R hip flexion, 4/5 R knee extension, 4/5 R knee flexion, 5/5 R ankle dorsiflexion.       Communication   Communication: No difficulties  Cognition Arousal/Alertness: Awake/alert Behavior During Therapy: WFL for tasks assessed/performed Overall Cognitive Status: Within Functional Limits for tasks assessed  General Comments      Exercises        Assessment/Plan    PT Assessment Patent does not need any further PT services  PT Diagnosis Difficulty walking   PT Problem List    PT Treatment Interventions     PT Goals (Current goals can be found in the Care Plan section) Acute Rehab PT Goals Patient Stated Goal: decrease pain    Frequency     Barriers to discharge        Co-evaluation               End of Session Equipment Utilized During Treatment: Gait belt Activity Tolerance: Patient limited  by pain Patient left: in bed;with call bell/phone within reach Nurse Communication: Mobility status    Functional Assessment Tool Used: clinical judgement Functional Limitation: Mobility: Walking and moving around Mobility: Walking and Moving Around Current Status [id number]): At least 1 percent but less than 20 percent impaired, limited or restricted Mobility: Walking and Moving Around Goal Status [phone]): 0 percent impaired, limited or restricted    Time: 1045-1103 PT Time Calculation (min) (ACUTE ONLY): 18 min   Charges:   PT Evaluation $PT Eval Moderate Complexity: 1 Procedure     PT G Codes:   PT G-Codes **NOT FOR INPATIENT CLASS** Functional Assessment Tool Used: clinical judgement Functional Limitation: Mobility: Walking and moving around Mobility: Walking and Moving Around Current Status [id number]): At least 1 percent but less than 20 percent impaired, limited or restricted Mobility: Walking and Moving Around Goal Status [phone]): 0 percent impaired, limited or restricted    [facility] 01/20/2016, 2:30 PM Sherie Don, [place], DPT [phone]

## 2016-01-20 NOTE — Progress Notes (Signed)
Pharmacy Antibiotic Note  Jessica Howard is a 55 y.o. female admitted on 01/19/2016 with hip infection.  Pharmacy has been consulted for Levaquin dosing. Already started on vanc. Ortho recommended IR drainage, MRI showed small R hip joint effusion w/ mild synovitis. Cultures from synovial fluid pending. Afeb, wbc 11.9. Noted multiple abx allergies listed.  Plan: Levo 750mg  IV q24h Vanc 1250g IV q12h Monitor clinical progress, c/s, renal function, abx plan/LOT VT@SS  as indicated   Weight: 298 lb 8 oz (135.4 kg)  Temp (24hrs), Avg:98 F (36.7 C), Min:97.6 F (36.4 C), Max:98.3 F (36.8 C)   Recent Labs Lab 01/17/16 1315 01/19/16 1350  WBC 8.1 11.9*  CREATININE  --  0.94    Estimated Creatinine Clearance: 93.5 mL/min (by C-G formula based on SCr of 0.94 mg/dL).    Allergies  Allergen Reactions  . Bactrim [Sulfamethoxazole-Trimethoprim] Hives  . Cefuroxime Axetil Itching  . Cephalosporins Itching  . Iohexol Itching and Rash     Code: RASH, Desc: HAD ITCHING AND A RASH ABOUT ONE HOUR AFTER RETURNING HOME FROM THE CT, Onset Date: [id number]   . Lisinopril Cough  . Penicillins Hives  . Sulfa Antibiotics Hives    Elicia Lamp, PharmD, [facility] Clinical Pharmacist Pager [phone] 01/20/2016 6:55 PM

## 2016-01-20 NOTE — Progress Notes (Signed)
Pt. on CPAP tolerating well, RT to monitor.

## 2016-01-20 NOTE — Progress Notes (Signed)
Set up CPAP for pt. use h/s, humidity filled, no oxygen being used, uses M/FFM per home regimen, 5.5 cmh20,set on machine, made aware to notify when/if help needed when ready to place on.

## 2016-01-20 NOTE — Progress Notes (Signed)
RT NOTE:  Pt will put CPAP on when she is ready. CPAP moved to bedside, humidity chamber filled. RT will monitor.

## 2016-01-21 DIAGNOSIS — M25451 Effusion, right hip: Principal | ICD-10-CM

## 2016-01-21 LAB — GLUCOSE, CAPILLARY
GLUCOSE-CAPILLARY: 81 mg/dL (ref 65–99)
GLUCOSE-CAPILLARY: 69 mg/dL (ref 65–99)
GLUCOSE-CAPILLARY: 78 mg/dL (ref 65–99)
GLUCOSE-CAPILLARY: 159 mg/dL — AB (ref 65–99)

## 2016-01-21 LAB — SYNOVIAL CELL COUNT + DIFF, W/ CRYSTALS
CRYSTALS FLUID: UNDETERMINED
Eosinophils-Synovial: 0 % (ref 0–1)
Lymphocytes-Synovial Fld: 2 % (ref 0–20)
MONOCYTE-MACROPHAGE-SYNOVIAL FLUID: 33 % — AB (ref 50–90)
Neutrophil, Synovial: 65 % — ABNORMAL HIGH (ref 0–25)
WBC, SYNOVIAL: 22250 /mm3 — AB (ref 0–200)

## 2016-01-21 MED ORDER — HYDROCODONE-ACETAMINOPHEN 5-325 MG PO TABS: 1.0000 | ORAL_TABLET | Freq: Four times a day (QID) | ORAL | 0 refills | Status: DC | PRN

## 2016-01-21 MED ORDER — HYDROCODONE-ACETAMINOPHEN 5-325 MG PO TABS: 1.0000 | ORAL_TABLET | Freq: Four times a day (QID) | ORAL | Status: DC | PRN

## 2016-01-21 MED ORDER — DOXYCYCLINE HYCLATE 50 MG PO CAPS: 100.0000 mg | ORAL_CAPSULE | Freq: Two times a day (BID) | ORAL | 0 refills | Status: DC

## 2016-01-21 NOTE — Progress Notes (Signed)
Discharge instruction provided to pt and prescriptions offered to pt. Patient is ready to discharge.

## 2016-01-21 NOTE — Discharge Summary (Signed)
Physician Discharge Summary  Jessica Howard [id number] DOB: 04-09-1961 DOA: 01/19/2016  PCP: Merrilee Seashore, MD  Admit date: 01/19/2016 Discharge date: 01/21/2016  Admitted From: home Disposition:  home  Recommendations for Outpatient Follow-up:  1. Follow up with PCP within 1 weeks 2. Follow up with Dr. Sharol Given if hip pain persists  Home Health:no Equipment/Devices:none Discharge Condition:stable  CODE STATUS:full Diet recommendation:Carb Modified  Brief/Interim Summary: Jessica Howard a 55 y.o.femalewith PMH of HTN, HPL, DM, OSA on CPAP, reported h/o rheumatoid arthritis on chronic prednisone presented with sudden onset of right side hip pain radiating to the groin area. She started experiencing severe right hip pain 2 days ago, unable to ambulate due to pain. Denies previous history of hip pains, no trauma, no fall, no focal weakness or paresthesia, no fevers. In the ED Jessica Howard was worked up for suspected questionable infection in the right hip. D/w Dr. Sharol Given who recommended IR drainage. Then IR requested to obtain MRI hip prior to attempted procedure.  MRI showed small R hip joint effusion with mild synovitis. Jessica Howard underwent IR aspiration on 8/12.  Discussed results with Dr. Sharol Given who believes this is mostly inflammation.  Discharge Diagnoses:  Active Problems:   Right hip pain   Hip pain   Hip joint effusion    Discharge Instructions  Discharge Instructions    Call MD for:  difficulty breathing, headache or visual disturbances    Complete by:  As directed   Call MD for:  redness, tenderness, or signs of infection (pain, swelling, redness, odor or green/yellow discharge around incision site)    Complete by:  As directed   Call MD for:  severe uncontrolled pain    Complete by:  As directed   Call MD for:  temperature >100.4    Complete by:  As directed   Diet Carb Modified    Complete by:  As directed   Increase activity slowly    Complete by:  As directed        Medication List    TAKE these medications   albuterol 108 (90 Base) MCG/ACT inhaler Commonly known as:  PROVENTIL HFA;VENTOLIN HFA Inhale 2 puffs into the lungs every 6 (six) hours as needed for wheezing or shortness of breath.   amitriptyline 25 MG tablet Commonly known as:  ELAVIL Take 25 mg by mouth at bedtime.   ARTIFICIAL TEAR OP Place 1 drop into both eyes as needed (for dry eyes).   BYSTOLIC 20 MG Tabs Generic drug:  Nebivolol HCl Take 20 mg by mouth daily.   chlorthalidone 25 MG tablet Commonly known as:  HYGROTON Take 25 mg by mouth daily.   CRESTOR 20 MG tablet Generic drug:  rosuvastatin Take 20 mg by mouth daily.   diltiazem 240 MG 24 hr capsule Commonly known as:  CARDIZEM CD Take 240 mg by mouth daily.   doxycycline 50 MG capsule Commonly known as:  VIBRAMYCIN Take 2 capsules (100 mg total) by mouth 2 (two) times daily.   fluticasone 50 MCG/ACT nasal spray Commonly known as:  FLONASE Place 1 spray into both nostrils daily as needed for allergies or rhinitis.   gabapentin 300 MG capsule Commonly known as:  NEURONTIN Take 600 mg by mouth 3 (three) times daily.   HYDROcodone-acetaminophen 5-325 MG tablet Commonly known as:  NORCO/VICODIN Take 1 tablet by mouth every 6 (six) hours as needed for severe pain.   insulin aspart 100 UNIT/ML injection Commonly known as:  novoLOG Inject 55-60 Units into the  skin 3 (three) times daily before meals. 55 base Plus sliding scale   insulin NPH Human 100 UNIT/ML injection Commonly known as:  HUMULIN N,NOVOLIN N Inject 75 Units into the skin 3 (three) times daily with meals. 75 units with breakfast; lunch 75 units; 75 units at dinner   irbesartan 300 MG tablet Commonly known as:  AVAPRO Take 300 mg by mouth daily.   leflunomide 20 MG tablet Commonly known as:  ARAVA Take 20 mg by mouth daily.   metFORMIN 500 MG 24 hr tablet Commonly known as:  GLUCOPHAGE-XR Take 500 mg by mouth every evening.    methocarbamol 500 MG tablet Commonly known as:  ROBAXIN Take 500 mg by mouth every 8 (eight) hours as needed for muscle spasms.   ORENCIA 250 MG injection Generic drug:  abatacept Inject 1,000 mg into the vein every 28 (twenty-eight) days. Monthly Infusion   pantoprazole 40 MG tablet Commonly known as:  PROTONIX Take 40 mg by mouth daily.   predniSONE 5 MG tablet Commonly known as:  DELTASONE Take 5 mg by mouth daily.   tiZANidine 4 MG tablet Commonly known as:  ZANAFLEX Take 4 mg by mouth 2 (two) times daily as needed (migraines). Limit 2 days per week   topiramate 100 MG tablet Commonly known as:  TOPAMAX Take 200 mg by mouth daily.   TRULICITY [id number] Sopn Generic drug:  Dulaglutide Inject 0.75 mg into the skin once a week. Jessica Howard takes on Wednesday.   VOLTAREN 1 % Gel Generic drug:  diclofenac sodium Apply 1 application topically 3 (three) times daily as needed (for pain).      Follow-up Information    RAMACHANDRAN,AJITH, MD. Schedule an appointment as soon as possible for a visit today.   Specialty:  Internal Medicine Contact information: [address] [place] [address] [phone]        Newt Minion, MD. Call in 4 week(s).   Specialty:  Orthopedic Surgery Why:  if pain does not improve Contact information: [address] [place] [address] [phone]          Allergies  Allergen Reactions  . Bactrim [Sulfamethoxazole-Trimethoprim] Hives  . Cefuroxime Axetil Itching  . Cephalosporins Itching  . Iohexol Itching and Rash     Code: RASH, Desc: HAD ITCHING AND A RASH ABOUT ONE HOUR AFTER RETURNING HOME FROM THE CT, Onset Date: [id number]   . Lisinopril Cough  . Penicillins Hives  . Sulfa Antibiotics Hives    Consultations:  Orthopaedic   Procedures/Studies: Mr Hip Right W Wo Contrast  Result Date: 01/20/2016 CLINICAL DATA:  Sudden onset right groin pain and right hip pain starting last night.  History of rheumatoid arthritis. EXAM: MRI OF THE RIGHT HIP WITHOUT AND WITH CONTRAST TECHNIQUE: Multiplanar, multisequence MR imaging was performed both before and after administration of intravenous contrast. CONTRAST:  49mL MULTIHANCE GADOBENATE DIMEGLUMINE 529 MG/ML IV SOLN COMPARISON:  01/19/2016 FINDINGS: Bones: Unremarkable Articular cartilage and labrum Articular cartilage: Moderate degenerative chondral thinning in both hips. Labrum:  No gross labral tear identified. Joint or bursal effusion Joint effusion: Mild right hip joint effusion with mild synovial enhancement. No bony erosion. The amount of fluid in the left hip joint is upper normal. Bursae:  No bursitis Muscles and tendons Muscles and tendons:  Unremarkable. Other findings Miscellaneous: Uterus, ovaries, an urinary bladder unremarkable. No distal hydroureter. IMPRESSION: 1. Small right hip joint effusion with mild synovitis. This could be a manifestation of rheumatoid arthropathy but is generally  nonspecific. 2. Moderate degenerative chondral thinning in both hips. 3. No distinct erosions.  Sacroiliac joints unremarkable. Electronically Signed   By: Van Clines M.D.   On: 01/20/2016 08:45   Ct Hip Right Wo Contrast  Result Date: 01/19/2016 CLINICAL DATA:  Acute onset of stabbing right hip pain last night. EXAM: CT OF THE RIGHT HIP WITHOUT CONTRAST TECHNIQUE: Multidetector CT imaging of the right hip was performed according to the standard protocol. Multiplanar CT image reconstructions were also generated. COMPARISON:  Radiographs dated 01/19/2016 FINDINGS: Bones/Joint/Cartilage The bones of the right hip are normal. Slight arthritic changes of the right sacroiliac joint. The other visualized bones are normal. Ligaments Suboptimally assessed by CT.  Normal. Muscles and Tendons Normal. Soft tissues Normal. IMPRESSION: Normal right hip. Slight degenerative changes of the right sacroiliac joint. Electronically Signed   By: Lorriane Shire  M.D.   On: 01/19/2016 15:01   Dg Fluoro Guided Needle Plc Aspiration/injection Loc  Result Date: 01/20/2016 CLINICAL DATA:  Evaluate right hip fluid for possible infection. EXAM: RIGHT HIP ASPIRATION UNDER FLUOROSCOPY FLUOROSCOPY TIME:  Fluoroscopy Time (in minutes and seconds): 54 second PROCEDURE: Overlying skin prepped with Betadine, draped in the usual sterile fashion, and infiltrated locally with buffered Lidocaine. Curved 20 gauge spinal needle advanced to the superolateral margin of the right femoral head. Approximately 3 cc of sanguinous fluid was aspirated from the hip joint. Fluid was sent to the laboratory for analysis. IMPRESSION: Technically successful right hip aspiration. Electronically Signed   By: Lovey Newcomer M.D.   On: 01/20/2016 14:33   Dg Hip Unilat W Or Wo Pelvis 2-3 Views Right  Result Date: 01/19/2016 CLINICAL DATA:  Right hip pain for 1 day.  No reported injury. EXAM: DG HIP (WITH OR WITHOUT PELVIS) 2-3V RIGHT COMPARISON:  03/01/2008 CT abdomen/pelvis FINDINGS: No fracture or dislocation in the right hip. Mild osteoarthritis in the weight-bearing portions of both hip joints. No pelvic fracture or diastasis. No suspicious focal osseous lesions. Degenerative changes in the visualized lower lumbar spine. IMPRESSION: No fracture or malalignment. Mild osteoarthritis in the weight-bearing portions of both hip joints. Electronically Signed   By: Ilona Sorrel M.D.   On: 01/19/2016 13:01        Subjective: Jessica Howard is in good spirits to go home today.  She has gotten up to ambulate to the restroom frequently and states that she still has some pain with walking but it is much improved from time of admission.  She is eating well and voices no chest pain, chest pressure, shortness of breath.  Discharge Exam: Vitals:   01/21/16 0430 01/21/16 1411  BP: 135/77 134/68  Pulse: 63 88  Resp:  16  Temp:  98.5 F (36.9 C)   Vitals:   01/20/16 1555 01/20/16 1943 01/21/16 0430 01/21/16  1411  BP: (!) 148/66 (!) 155/65 135/77 134/68  Pulse: 88 94 63 88  Resp: 16   16  Temp: 98 F (36.7 C) 98.6 F (37 C)  98.5 F (36.9 C)  TempSrc: Oral Oral  Oral  SpO2: 99% 98% 96% 96%  Weight:        General: Pt is alert, awake, not in acute distress Cardiovascular: RRR, S1/S2 +, no rubs, no gallops Respiratory: CTA bilaterally, no wheezing, no rhonchi Abdominal: Soft, NT, ND, bowel sounds +, obese Extremities: no edema, no cyanosis, FROM of hip joints bilaterally    The results of significant diagnostics from this hospitalization (including imaging, microbiology, ancillary and laboratory) are listed below for  reference.     Microbiology: Recent Results (from the past 240 hour(s))  Body fluid culture     Status: None (Preliminary result)   Collection Time: 01/20/16  1:30 PM  Result Value Ref Range Status   Specimen Description FLUID SYNOVIAL RIGHT HIP  Final   Special Requests Immunocompromised  Final   Gram Stain   Final    FEW WBC PRESENT, PREDOMINANTLY PMN NO ORGANISMS SEEN    Culture NO GROWTH 1 DAY  Final   Report Status PENDING  Incomplete  Anaerobic culture     Status: None (Preliminary result)   Collection Time: 01/20/16  1:30 PM  Result Value Ref Range Status   Specimen Description FLUID SYNOVIAL RIGHT HIP  Final   Special Requests NONE  Final   Culture PENDING  Incomplete   Report Status PENDING  Incomplete     Labs: BNP (last 3 results)  Recent Labs  12/01/15 0204  BNP [id number]*   Basic Metabolic Panel:  Recent Labs Lab 01/19/16 1350  NA 136  K 4.7  CL 101  CO2 24  GLUCOSE 402*  BUN 19  CREATININE 0.94  CALCIUM 9.1   Liver Function Tests: No results for input(s): AST, ALT, ALKPHOS, BILITOT, PROT, ALBUMIN in the last 168 hours. No results for input(s): LIPASE, AMYLASE in the last 168 hours. No results for input(s): AMMONIA in the last 168 hours. CBC:  Recent Labs Lab 01/17/16 1315 01/19/16 1350  WBC 8.1 11.9*  NEUTROABS 4.8 10.1*   HGB 9.8* 10.2*  HCT 31.3* 33.9*  MCV 66.6* 66.7*  PLT 276 303   Cardiac Enzymes: No results for input(s): CKTOTAL, CKMB, CKMBINDEX, TROPONINI in the last 168 hours. BNP: Invalid input(s): POCBNP CBG:  Recent Labs Lab 01/20/16 1937 01/20/16 2129 01/21/16 0420 01/21/16 0740 01/21/16 1140  GLUCAP 111* 65 81 69 78   D-Dimer No results for input(s): DDIMER in the last 72 hours. Hgb A1c No results for input(s): HGBA1C in the last 72 hours. Lipid Profile No results for input(s): CHOL, HDL, LDLCALC, TRIG, CHOLHDL, LDLDIRECT in the last 72 hours. Thyroid function studies No results for input(s): TSH, T4TOTAL, T3FREE, THYROIDAB in the last 72 hours.  Invalid input(s): FREET3 Anemia work up No results for input(s): VITAMINB12, FOLATE, FERRITIN, TIBC, IRON, RETICCTPCT in the last 72 hours. Urinalysis    Component Value Date/Time   COLORURINE YELLOW 01/19/2016 1545   APPEARANCEUR HAZY (A) 01/19/2016 1545   LABSPEC 1.030 01/19/2016 1545   PHURINE 5.5 01/19/2016 1545   GLUCOSEU >1000 (A) 01/19/2016 1545   HGBUR NEGATIVE 01/19/2016 1545   BILIRUBINUR NEGATIVE 01/19/2016 1545   KETONESUR NEGATIVE 01/19/2016 1545   PROTEINUR 30 (A) 01/19/2016 1545   UROBILINOGEN 0.2 02/28/2014 1351   NITRITE NEGATIVE 01/19/2016 1545   LEUKOCYTESUR NEGATIVE 01/19/2016 1545   Sepsis Labs Invalid input(s): PROCALCITONIN,  WBC,  LACTICIDVEN Microbiology Recent Results (from the past 240 hour(s))  Body fluid culture     Status: None (Preliminary result)   Collection Time: 01/20/16  1:30 PM  Result Value Ref Range Status   Specimen Description FLUID SYNOVIAL RIGHT HIP  Final   Special Requests Immunocompromised  Final   Gram Stain   Final    FEW WBC PRESENT, PREDOMINANTLY PMN NO ORGANISMS SEEN    Culture NO GROWTH 1 DAY  Final   Report Status PENDING  Incomplete  Anaerobic culture     Status: None (Preliminary result)   Collection Time: 01/20/16  1:30 PM  Result Value Ref Range  Status    Specimen Description FLUID SYNOVIAL RIGHT HIP  Final   Special Requests NONE  Final   Culture PENDING  Incomplete   Report Status PENDING  Incomplete     Time coordinating discharge: Over 30 minutes  SIGNED:   Newman Pies, MD  [facility] 01/21/2016, 4:28 PM Pager [phone] If 7PM-7AM, please contact night-coverage [email or web address] Password [email or web address]

## 2016-01-22 ENCOUNTER — Other Ambulatory Visit: Payer: Self-pay | Admitting: *Deleted

## 2016-01-22 DIAGNOSIS — R700 Elevated erythrocyte sedimentation rate: Secondary | ICD-10-CM

## 2016-01-22 DIAGNOSIS — D72829 Elevated white blood cell count, unspecified: Secondary | ICD-10-CM

## 2016-01-22 DIAGNOSIS — R7982 Elevated C-reactive protein (CRP): Secondary | ICD-10-CM

## 2016-01-22 DIAGNOSIS — E119 Type 2 diabetes mellitus without complications: Secondary | ICD-10-CM

## 2016-01-22 NOTE — Patient Outreach (Addendum)
[name]) Care Management  01/22/2016  Jessica Howard Nov 08, 1960 [id number]   Subjective: Telephone call to patient's home / mobile number, spoke with patient, and HIPAA verified.  States she is doing better, just got out of the hospital last night due to extreme right hip pain and hip infection was ruled out. States the MD did take some fluid off her right hip and thought it may be related to long term steroid (prednisone) use.    States she will call primary MD's (Dr. Ashby Dawes) and orthopedic MD ( Dr. Sharol Given) office today to set up a follow up appointment per hospital discharge instructions.   Patient states she has not had a chance to purchase a blood pressure cuff and will need an extra large / thigh cuff.   RNCM advised [facility] Care Management does not have an extra large / thigh cuff in stock.  RNCM advised patient to follow up with local discount medical supplies stores, retail, or pharmacies that may be able to provide.   Patient voices understanding and states she will follow up to purchase.    Patient states she does not have any EMMI Heart Failure follow up, pharmacy, care coordination, transition of care, community resource, or transportation needs at this time.  Discussed [facility] Care Management educational programs for heart failure, diabetes, and hypertension.   Patient states she would like to be transitioned to the diabetes disease management program initially, may also need additional information on heart failure and hypertension in the future.   Patient in agreement to referral to [facility] for diabetes disease management.   Patient will continue to receive [facility] services.  Objective: Per chart review: Patient hospitalized 12/01/15 - 12/02/15 for congestive heart failure. Patient also has a history of hyperlipidemia, acute diastolic congestive heart failure, diabetes, rheumatoid arthritis, psoriasis, and obstructive sleep  apnea. Patient had ED visit on 12/15/15 for right knee pain, bakers cyst, and knee arthritis.  Patient hospitalized 01/19/16 - 01/21/16, for right hip pain and joint effusion.   Assessment: EMMI Heart Failure program follow completed and  patient has no additional care coordination needs at this time.  Transition of care follow up completed due to recent hospitalization.   Patient will be followed by [facility] for diabetes disease management.   Patient has no Telephonic RNCM needs at this time.   Plan:RNCM will refer patient to [facility] for diabetes disease management.    [place] Pirie H. Annia Friendly, BSN, [facility] Telephonic CM Phone: [phone] Fax: [phone]

## 2016-01-23 ENCOUNTER — Encounter: Payer: Self-pay | Admitting: *Deleted

## 2016-01-23 LAB — BODY FLUID CULTURE: CULTURE: NO GROWTH

## 2016-01-25 LAB — ANAEROBIC CULTURE

## 2016-01-26 ENCOUNTER — Other Ambulatory Visit (HOSPITAL_COMMUNITY)
Admission: RE | Admit: 2016-01-26 | Discharge: 2016-01-26 | Disposition: A | Discharge status: Home / Self Care | Payer: Medicare Other | Source: Ambulatory Visit | Attending: Internal Medicine | Admitting: Internal Medicine

## 2016-01-26 ENCOUNTER — Other Ambulatory Visit: Payer: Self-pay | Admitting: Registered Nurse

## 2016-01-26 DIAGNOSIS — Z124 Encounter for screening for malignant neoplasm of cervix: Secondary | ICD-10-CM | POA: Diagnosis not present

## 2016-01-26 DIAGNOSIS — I10 Essential (primary) hypertension: Secondary | ICD-10-CM | POA: Diagnosis not present

## 2016-01-26 DIAGNOSIS — Z1212 Encounter for screening for malignant neoplasm of rectum: Secondary | ICD-10-CM | POA: Diagnosis not present

## 2016-01-26 DIAGNOSIS — Z01419 Encounter for gynecological examination (general) (routine) without abnormal findings: Secondary | ICD-10-CM | POA: Diagnosis not present

## 2016-01-31 LAB — CYTOLOGY - PAP

## 2016-02-07 ENCOUNTER — Other Ambulatory Visit (HOSPITAL_COMMUNITY): Payer: Self-pay

## 2016-02-07 DIAGNOSIS — M25551 Pain in right hip: Secondary | ICD-10-CM | POA: Diagnosis not present

## 2016-02-07 DIAGNOSIS — Z09 Encounter for follow-up examination after completed treatment for conditions other than malignant neoplasm: Secondary | ICD-10-CM | POA: Diagnosis not present

## 2016-02-08 ENCOUNTER — Ambulatory Visit (HOSPITAL_COMMUNITY)
Admission: RE | Admit: 2016-02-08 | Discharge: 2016-02-08 | Disposition: A | Discharge status: Home / Self Care | Payer: Medicare Other | Source: Ambulatory Visit | Attending: Rheumatology | Admitting: Rheumatology

## 2016-02-08 DIAGNOSIS — M069 Rheumatoid arthritis, unspecified: Secondary | ICD-10-CM | POA: Diagnosis not present

## 2016-02-08 MED ORDER — SODIUM CHLORIDE 0.9 % IV SOLN
INTRAVENOUS | Status: DC
  Administered 2016-02-08: 09:00:00 via INTRAVENOUS

## 2016-02-08 MED ORDER — ABATACEPT 250 MG IV SOLR
1000.0000 mg | INTRAVENOUS | Status: DC
  Administered 2016-02-08: 1000 mg via INTRAVENOUS
  Filled 2016-02-08: qty 40

## 2016-02-14 ENCOUNTER — Other Ambulatory Visit (HOSPITAL_BASED_OUTPATIENT_CLINIC_OR_DEPARTMENT_OTHER): Payer: Medicare Other

## 2016-02-14 ENCOUNTER — Ambulatory Visit (HOSPITAL_BASED_OUTPATIENT_CLINIC_OR_DEPARTMENT_OTHER): Payer: Medicare Other

## 2016-02-14 VITALS — BP 111/84 | HR 93 | Temp 97.8°F | Resp 18

## 2016-02-14 DIAGNOSIS — D509 Iron deficiency anemia, unspecified: Secondary | ICD-10-CM

## 2016-02-14 DIAGNOSIS — D569 Thalassemia, unspecified: Secondary | ICD-10-CM

## 2016-02-14 DIAGNOSIS — D563 Thalassemia minor: Secondary | ICD-10-CM

## 2016-02-14 DIAGNOSIS — M069 Rheumatoid arthritis, unspecified: Secondary | ICD-10-CM

## 2016-02-14 DIAGNOSIS — D638 Anemia in other chronic diseases classified elsewhere: Secondary | ICD-10-CM

## 2016-02-14 DIAGNOSIS — N289 Disorder of kidney and ureter, unspecified: Secondary | ICD-10-CM

## 2016-02-14 LAB — CBC WITH DIFFERENTIAL/PLATELET
BASO%: 0.8 % (ref 0.0–2.0)
BASOS ABS: 0.1 10*3/uL (ref 0.0–0.1)
EOS ABS: 0.1 10*3/uL (ref 0.0–0.5)
EOS%: 1.2 % (ref 0.0–7.0)
HEMATOCRIT: 30.5 % — AB (ref 34.8–46.6)
HGB: 9.5 g/dL — ABNORMAL LOW (ref 11.6–15.9)
LYMPH#: 3 10*3/uL (ref 0.9–3.3)
LYMPH%: 36.3 % (ref 14.0–49.7)
MCH: 20.3 pg — AB (ref 25.1–34.0)
MCHC: 31.1 g/dL — AB (ref 31.5–36.0)
MCV: 65.2 fL — ABNORMAL LOW (ref 79.5–101.0)
MONO#: 1 10*3/uL — AB (ref 0.1–0.9)
MONO%: 12.4 % (ref 0.0–14.0)
NEUT#: 4.1 10*3/uL (ref 1.5–6.5)
NEUT%: 49.3 % (ref 38.4–76.8)
PLATELETS: 251 10*3/uL (ref 145–400)
RBC: 4.69 10*6/uL (ref 3.70–5.45)
RDW: 16.6 % — ABNORMAL HIGH (ref 11.2–14.5)
WBC: 8.4 10*3/uL (ref 3.9–10.3)

## 2016-02-14 LAB — COMPREHENSIVE METABOLIC PANEL
ALBUMIN: 3.2 g/dL — AB (ref 3.5–5.0)
ALK PHOS: 93 U/L (ref 40–150)
ALT: 17 U/L (ref 0–55)
ANION GAP: 9 meq/L (ref 3–11)
AST: 16 U/L (ref 5–34)
BUN: 16.2 mg/dL (ref 7.0–26.0)
CALCIUM: 8.9 mg/dL (ref 8.4–10.4)
CHLORIDE: 112 meq/L — AB (ref 98–109)
CO2: 23 mEq/L (ref 22–29)
CREATININE: 0.9 mg/dL (ref 0.6–1.1)
EGFR: 81 mL/min/{1.73_m2} — ABNORMAL LOW (ref >90)
Glucose: 152 mg/dl — ABNORMAL HIGH (ref 70–140)
POTASSIUM: 3.6 meq/L (ref 3.5–5.1)
Sodium: 144 mEq/L (ref 136–145)
Total Bilirubin: <0.3 mg/dL (ref 0.20–1.20)
Total Protein: 7.1 g/dL (ref 6.4–8.3)

## 2016-02-14 LAB — IRON AND TIBC
%SAT: 33 % (ref 21–57)
IRON: 74 ug/dL (ref 41–142)
TIBC: 226 ug/dL — ABNORMAL LOW (ref 236–444)
UIBC: 152 ug/dL (ref 120–384)

## 2016-02-14 LAB — FERRITIN: FERRITIN: 331 ng/mL — AB (ref 9–269)

## 2016-02-14 MED ORDER — DARBEPOETIN ALFA 300 MCG/0.6ML IJ SOSY
300.0000 ug | PREFILLED_SYRINGE | Freq: Once | INTRAMUSCULAR | Status: AC
  Administered 2016-02-14: 300 ug via SUBCUTANEOUS
  Filled 2016-02-14: qty 0.6

## 2016-02-14 NOTE — Patient Instructions (Signed)
Darbepoetin Alfa injection What is this medicine? DARBEPOETIN ALFA (dar be POE e tin AL fa) helps your body make more red blood cells. It is used to treat anemia caused by chronic kidney failure and chemotherapy. This medicine may be used for other purposes; ask your health care provider or pharmacist if you have questions. COMMON BRAND NAME(S): Aranesp What should I tell my health care provider before I take this medicine? They need to know if you have any of these conditions: -blood clotting disorders or history of blood clots -cancer patient not on chemotherapy -cystic fibrosis -heart disease, such as angina, heart failure, or a history of a heart attack -hemoglobin level of 12 g/dL or greater -high blood pressure -low levels of folate, iron, or vitamin B12 -seizures -an unusual or allergic reaction to darbepoetin, erythropoietin, albumin, hamster proteins, latex, other medicines, foods, dyes, or preservatives -pregnant or trying to get pregnant -breast-feeding How should I use this medicine? This medicine is for injection into a vein or under the skin. It is usually given by a health care professional in a hospital or clinic setting. If you get this medicine at home, you will be taught how to prepare and give this medicine. Do not shake the solution before you withdraw a dose. Use exactly as directed. Take your medicine at regular intervals. Do not take your medicine more often than directed. It is important that you put your used needles and syringes in a special sharps container. Do not put them in a trash can. If you do not have a sharps container, call your pharmacist or healthcare provider to get one. Talk to your pediatrician regarding the use of this medicine in children. While this medicine may be used in children as young as 1 year for selected conditions, precautions do apply. Overdosage: If you think you have taken too much of this medicine contact a poison control center or  emergency room at once. NOTE: This medicine is only for you. Do not share this medicine with others. What if I miss a dose? If you miss a dose, take it as soon as you can. If it is almost time for your next dose, take only that dose. Do not take double or extra doses. What may interact with this medicine? Do not take this medicine with any of the following medications: -epoetin alfa This list may not describe all possible interactions. Give your health care provider a list of all the medicines, herbs, non-prescription drugs, or dietary supplements you use. Also tell them if you smoke, drink alcohol, or use illegal drugs. Some items may interact with your medicine. What should I watch for while using this medicine? Visit your prescriber or health care professional for regular checks on your progress and for the needed blood tests and blood pressure measurements. It is especially important for the doctor to make sure your hemoglobin level is in the desired range, to limit the risk of potential side effects and to give you the best benefit. Keep all appointments for any recommended tests. Check your blood pressure as directed. Ask your doctor what your blood pressure should be and when you should contact him or her. As your body makes more red blood cells, you may need to take iron, folic acid, or vitamin B supplements. Ask your doctor or health care provider which products are right for you. If you have kidney disease continue dietary restrictions, even though this medication can make you feel better. Talk with your doctor or health   care professional about the foods you eat and the vitamins that you take. What side effects may I notice from receiving this medicine? Side effects that you should report to your doctor or health care professional as soon as possible: -allergic reactions like skin rash, itching or hives, swelling of the face, lips, or tongue -breathing problems -changes in vision -chest  pain -confusion, trouble speaking or understanding -feeling faint or lightheaded, falls -high blood pressure -muscle aches or pains -pain, swelling, warmth in the leg -rapid weight gain -severe headaches -sudden numbness or weakness of the face, arm or leg -trouble walking, dizziness, loss of balance or coordination -seizures (convulsions) -swelling of the ankles, feet, hands -unusually weak or tired Side effects that usually do not require medical attention (report to your doctor or health care professional if they continue or are bothersome): -diarrhea -fever, chills (flu-like symptoms) -headaches -nausea, vomiting -redness, stinging, or swelling at site where injected This list may not describe all possible side effects. Call your doctor for medical advice about side effects. You may report side effects to FDA at 1-800-FDA-1088. Where should I keep my medicine? Keep out of the reach of children. Store in a refrigerator between 2 and 8 degrees C (36 and 46 degrees F). Do not freeze. Do not shake. Throw away any unused portion if using a single-dose vial. Throw away any unused medicine after the expiration date. NOTE: This sheet is a summary. It may not cover all possible information. If you have questions about this medicine, talk to your doctor, pharmacist, or health care provider.  2015, [employer]. (2008-05-10 10:23:57)  

## 2016-02-16 DIAGNOSIS — D649 Anemia, unspecified: Secondary | ICD-10-CM | POA: Diagnosis not present

## 2016-02-16 DIAGNOSIS — Z0000 Encounter for general adult medical examination without abnormal findings: Secondary | ICD-10-CM | POA: Diagnosis not present

## 2016-02-16 DIAGNOSIS — N390 Urinary tract infection, site not specified: Secondary | ICD-10-CM | POA: Diagnosis not present

## 2016-02-16 DIAGNOSIS — Z23 Encounter for immunization: Secondary | ICD-10-CM | POA: Diagnosis not present

## 2016-02-16 DIAGNOSIS — E10319 Type 1 diabetes mellitus with unspecified diabetic retinopathy without macular edema: Secondary | ICD-10-CM | POA: Diagnosis not present

## 2016-02-16 DIAGNOSIS — E7800 Pure hypercholesterolemia, unspecified: Secondary | ICD-10-CM | POA: Diagnosis not present

## 2016-02-16 DIAGNOSIS — I10 Essential (primary) hypertension: Secondary | ICD-10-CM | POA: Diagnosis not present

## 2016-02-22 DIAGNOSIS — M5442 Lumbago with sciatica, left side: Secondary | ICD-10-CM | POA: Diagnosis not present

## 2016-02-22 DIAGNOSIS — M160 Bilateral primary osteoarthritis of hip: Secondary | ICD-10-CM | POA: Diagnosis not present

## 2016-02-22 DIAGNOSIS — M5441 Lumbago with sciatica, right side: Secondary | ICD-10-CM | POA: Diagnosis not present

## 2016-02-22 DIAGNOSIS — E1142 Type 2 diabetes mellitus with diabetic polyneuropathy: Secondary | ICD-10-CM | POA: Diagnosis not present

## 2016-02-23 DIAGNOSIS — E7800 Pure hypercholesterolemia, unspecified: Secondary | ICD-10-CM | POA: Diagnosis not present

## 2016-02-23 DIAGNOSIS — E10319 Type 1 diabetes mellitus with unspecified diabetic retinopathy without macular edema: Secondary | ICD-10-CM | POA: Diagnosis not present

## 2016-02-23 DIAGNOSIS — R600 Localized edema: Secondary | ICD-10-CM | POA: Diagnosis not present

## 2016-02-23 DIAGNOSIS — I5032 Chronic diastolic (congestive) heart failure: Secondary | ICD-10-CM | POA: Diagnosis not present

## 2016-02-28 ENCOUNTER — Encounter: Payer: Self-pay | Admitting: *Deleted

## 2016-02-28 ENCOUNTER — Other Ambulatory Visit: Payer: Self-pay | Admitting: *Deleted

## 2016-02-28 NOTE — Patient Outreach (Addendum)
[facility]) Care Management  02/28/2016   Jessica Howard 1961-03-06 [id number]  Subjective: RN Health Coach telephone call to patient.  Hipaa compliance verified. Per patient she has not taken her blood sugar today. The last time was last night. Per patient her blood sugar was 199 non fasting HS. Patient A1C is 10.0 . Patient is on oral agents and insulin. Patient checks blood sugars 3-4 times a day. Per patient she doesn't exercise. She stated that she wanted to check into water aerobics with her hip problems but she hasn't yet.  Per patient she does not have an advance directive. Patient stated she is able to get around and care for herself. Per patient she is able to afford her medications. Per patient she has transportation to her Dr.  Patient stated she tries to eat a diabetic diet.  Per patient she has not been to a nutritionist. Patient has agreed to follow up outreach calls.    Objective:   Current Medications:  Current Outpatient Prescriptions  Medication Sig Dispense Refill  . abatacept (ORENCIA) 250 MG injection Inject 1,000 mg into the vein every 28 (twenty-eight) days. Monthly Infusion    . albuterol (PROVENTIL HFA;VENTOLIN HFA) 108 (90 Base) MCG/ACT inhaler Inhale 2 puffs into the lungs every 6 (six) hours as needed for wheezing or shortness of breath.    Marland Kitchen amitriptyline (ELAVIL) 25 MG tablet Take 25 mg by mouth at bedtime.     . chlorthalidone (HYGROTON) 25 MG tablet Take 25 mg by mouth daily.    . CRESTOR 20 MG tablet Take 20 mg by mouth daily.     Marland Kitchen diltiazem (CARDIZEM CD) 240 MG 24 hr capsule Take 240 mg by mouth daily.     . fluticasone (FLONASE) 50 MCG/ACT nasal spray Place 1 spray into both nostrils daily as needed for allergies or rhinitis.    Marland Kitchen gabapentin (NEURONTIN) 300 MG capsule Take 600 mg by mouth 3 (three) times daily.     Marland Kitchen HYDROcodone-acetaminophen (NORCO/VICODIN) 5-325 MG tablet Take 1 tablet by mouth every 6 (six) hours as needed for severe  pain. 10 tablet 0  . insulin aspart (NOVOLOG) 100 UNIT/ML injection Inject 55-60 Units into the skin 3 (three) times daily before meals. 55 base Plus sliding scale    . insulin NPH (HUMULIN N,NOVOLIN N) 100 UNIT/ML injection Inject 75 Units into the skin 3 (three) times daily with meals. 75 units with breakfast; lunch 75 units; 75 units at dinner    . irbesartan (AVAPRO) 300 MG tablet Take 300 mg by mouth daily.     Marland Kitchen leflunomide (ARAVA) 20 MG tablet Take 20 mg by mouth daily.    . metFORMIN (GLUCOPHAGE-XR) 500 MG 24 hr tablet Take 500 mg by mouth every evening.    . methocarbamol (ROBAXIN) 500 MG tablet Take 500 mg by mouth every 8 (eight) hours as needed for muscle spasms.    . Nebivolol HCl (BYSTOLIC) 20 MG TABS Take 20 mg by mouth daily.     . pantoprazole (PROTONIX) 40 MG tablet Take 40 mg by mouth daily.    Marland Kitchen tiZANidine (ZANAFLEX) 4 MG tablet Take 4 mg by mouth 2 (two) times daily as needed (migraines). Limit 2 days per week    . topiramate (TOPAMAX) 100 MG tablet Take 200 mg by mouth daily.     . TRULICITY [id number] [id number] SOPN Inject 0.75 mg into the skin once a week. Patient takes on Wednesday.    . VOLTAREN 1 %  GEL Apply 1 application topically 3 (three) times daily as needed (for pain).     . ARTIFICIAL TEAR OP Place 1 drop into both eyes as needed (for dry eyes).    Marland Kitchen doxycycline (VIBRAMYCIN) 50 MG capsule Take 2 capsules (100 mg total) by mouth 2 (two) times daily. (Patient not taking: Reported on 02/28/2016) 120 capsule 0  . predniSONE (DELTASONE) 5 MG tablet Take 5 mg by mouth daily.     No current facility-administered medications for this visit.     Functional Status:  In your present state of health, do you have any difficulty performing the following activities: 02/28/2016 02/08/2016  Hearing? N N  Vision? N N  Difficulty concentrating or making decisions? N N  Walking or climbing stairs? N N  Dressing or bathing? N N  Doing errands, shopping? N -  Preparing Food and eating ?  N -  Using the Toilet? N -  In the past six months, have you accidently leaked urine? N -  Do you have problems with loss of bowel control? N -  Managing your Medications? N -  Managing your Finances? N -  Some recent data might be hidden    Fall/Depression Screening: PHQ 2/9 Scores 02/28/2016 04/20/2014 02/16/2014  PHQ - 2 Score 0 0 0   THN CM Care Plan Problem One   Flowsheet Row Most Recent Value  Care Plan Problem One  Knowledge Deficit in self management of diabetes  Role Documenting the Problem One  [place] for Problem One  Active  THN Long Term Goal (31-90 days)  Patient will see a decrease in Hgb A1C within the next 90 days from 10.0  THN Long Term Goal Start Date  02/28/16  Interventions for Problem One Long Term Goal  RN sent patient educational maaterial on A1C. RN discussed with patient what the A1C means abd how the blood sugars affect the A1C  THN CM Short Term Goal #1 (0-30 days)  Patient will be able to tell the signs and symptoms of hypo and hyperglycemia within the next 30 days  THN CM Short Term Goal #1 Start Date  02/28/16  Interventions for Short Term Goal #1  RN sent patient picture chart of hypo and hyper glycemia. RN sent Emmi educational material on low and high blood sugars. RN discussed with patient her signs and symptoms .  THN CM Short Term Goal #2 (0-30 days)  Patient will be able to describe the treatment for hyper and hypo glycemiawithin 30 days  THN CM Short Term Goal #2 Start Date  02/28/16  Interventions for Short Term Goal #2  RN sent educational material on treatment of hypo and hyperglycemia. RN sent patient living well with Diabetes booklet. RN will follow up with discussion and teach back  THN CM Short Term Goal #3 (0-30 days)  Patient will report checking blood sugars and documenting as per physician order within the next 30 days  THN CM Short Term Goal #3 Start Date  02/28/16  Interventions for Short Tern Goal #3  RN sent patient a  calendar book for documeting blood sugars. RN discussed the importance of checking blood sugars as per physician ordered. RN will monitor blood sugar checks and documentation with follow up outreach calls.       Assessment:  Patient A1C is 10.0 Knowledge Deficit in Self Management of Diabetes Patient will benefit from Health Coach telephonic outreach for education and support for diabetes self management. Plan:  RN sent calendar book for 2017-2018 for documentation RN sent living well with diabetes book RN sent picture chart of hypo and hyperglycemia RN sent educational material on hyper and hypoglycemia RN sent educational information on A1C RN sent educational material on tracking your blood sugars RN sent advance directive packet to patient RN sent Barriers letter and assessment  to physician RN will follow up outreach within the month of October RN sent patient Advance Directive [facility] [phone]

## 2016-02-29 ENCOUNTER — Encounter: Payer: Self-pay | Admitting: *Deleted

## 2016-03-04 DIAGNOSIS — H40023 Open angle with borderline findings, high risk, bilateral: Secondary | ICD-10-CM | POA: Diagnosis not present

## 2016-03-05 ENCOUNTER — Other Ambulatory Visit (HOSPITAL_COMMUNITY): Payer: Self-pay | Admitting: *Deleted

## 2016-03-06 ENCOUNTER — Encounter (HOSPITAL_COMMUNITY): Payer: Self-pay

## 2016-03-06 ENCOUNTER — Ambulatory Visit (HOSPITAL_COMMUNITY)
Admission: RE | Admit: 2016-03-06 | Discharge: 2016-03-06 | Disposition: A | Discharge status: Home / Self Care | Payer: Medicare Other | Source: Ambulatory Visit | Attending: Rheumatology | Admitting: Rheumatology

## 2016-03-06 DIAGNOSIS — N289 Disorder of kidney and ureter, unspecified: Secondary | ICD-10-CM | POA: Insufficient documentation

## 2016-03-06 DIAGNOSIS — D569 Thalassemia, unspecified: Secondary | ICD-10-CM | POA: Insufficient documentation

## 2016-03-06 DIAGNOSIS — D509 Iron deficiency anemia, unspecified: Secondary | ICD-10-CM | POA: Insufficient documentation

## 2016-03-06 DIAGNOSIS — M069 Rheumatoid arthritis, unspecified: Secondary | ICD-10-CM | POA: Insufficient documentation

## 2016-03-06 MED ORDER — SODIUM CHLORIDE 0.9 % IV SOLN
INTRAVENOUS | Status: DC
  Administered 2016-03-06: 10:00:00 via INTRAVENOUS

## 2016-03-06 MED ORDER — SODIUM CHLORIDE 0.9 % IV SOLN
1000.0000 mg | INTRAVENOUS | Status: DC
  Administered 2016-03-06: 1000 mg via INTRAVENOUS
  Filled 2016-03-06: qty 10

## 2016-03-06 NOTE — Progress Notes (Signed)
Called Dr. Audelia Hives office and per Asencion Partridge, RN okay to draw blood for TB testing and proceed with Orencia infusion today.

## 2016-03-07 ENCOUNTER — Encounter (HOSPITAL_COMMUNITY): Payer: Medicare Other

## 2016-03-07 DIAGNOSIS — M79641 Pain in right hand: Secondary | ICD-10-CM | POA: Diagnosis not present

## 2016-03-08 LAB — QUANTIFERON IN TUBE
QFT TB AG MINUS NIL VALUE: 0.06 [IU]/mL
QUANTIFERON MITOGEN VALUE: >10 IU/mL
QUANTIFERON TB AG VALUE: 0.08 IU/mL
QUANTIFERON TB GOLD: NEGATIVE
Quantiferon Nil Value: 0.02 IU/mL

## 2016-03-08 LAB — QUANTIFERON TB GOLD ASSAY (BLOOD)

## 2016-03-11 ENCOUNTER — Telehealth: Payer: Self-pay | Admitting: *Deleted

## 2016-03-11 NOTE — Telephone Encounter (Signed)
"  This is Benitta with transportation Calling to confirm an appointment on October 4th for this patient.  Confirmed appointment for lab and injection 03-13-2016 beginning at 9:30 am.

## 2016-03-13 ENCOUNTER — Other Ambulatory Visit (HOSPITAL_BASED_OUTPATIENT_CLINIC_OR_DEPARTMENT_OTHER): Payer: Medicare Other

## 2016-03-13 ENCOUNTER — Ambulatory Visit (HOSPITAL_BASED_OUTPATIENT_CLINIC_OR_DEPARTMENT_OTHER): Payer: Medicare Other

## 2016-03-13 VITALS — BP 132/52 | HR 88 | Temp 97.8°F | Resp 18

## 2016-03-13 DIAGNOSIS — M069 Rheumatoid arthritis, unspecified: Secondary | ICD-10-CM | POA: Diagnosis present

## 2016-03-13 DIAGNOSIS — N289 Disorder of kidney and ureter, unspecified: Secondary | ICD-10-CM

## 2016-03-13 DIAGNOSIS — D509 Iron deficiency anemia, unspecified: Secondary | ICD-10-CM

## 2016-03-13 DIAGNOSIS — D638 Anemia in other chronic diseases classified elsewhere: Secondary | ICD-10-CM | POA: Diagnosis not present

## 2016-03-13 DIAGNOSIS — D569 Thalassemia, unspecified: Secondary | ICD-10-CM

## 2016-03-13 DIAGNOSIS — D563 Thalassemia minor: Secondary | ICD-10-CM

## 2016-03-13 LAB — CBC WITH DIFFERENTIAL/PLATELET
BASO%: 0.7 % (ref 0.0–2.0)
Basophils Absolute: 0.1 10*3/uL (ref 0.0–0.1)
EOS ABS: 0.1 10*3/uL (ref 0.0–0.5)
EOS%: 1.3 % (ref 0.0–7.0)
HCT: 32.6 % — ABNORMAL LOW (ref 34.8–46.6)
HGB: 9.8 g/dL — ABNORMAL LOW (ref 11.6–15.9)
LYMPH#: 2.8 10*3/uL (ref 0.9–3.3)
LYMPH%: 30.7 % (ref 14.0–49.7)
MCH: 20.3 pg — ABNORMAL LOW (ref 25.1–34.0)
MCHC: 30 g/dL — ABNORMAL LOW (ref 31.5–36.0)
MCV: 67.6 fL — ABNORMAL LOW (ref 79.5–101.0)
MONO#: 1 10*3/uL — ABNORMAL HIGH (ref 0.1–0.9)
MONO%: 11.4 % (ref 0.0–14.0)
NEUT%: 55.9 % (ref 38.4–76.8)
NEUTROS ABS: 5.1 10*3/uL (ref 1.5–6.5)
Platelets: 240 10*3/uL (ref 145–400)
RBC: 4.82 10*6/uL (ref 3.70–5.45)
RDW: 18 % — AB (ref 11.2–14.5)
WBC: 9.1 10*3/uL (ref 3.9–10.3)

## 2016-03-13 MED ORDER — DARBEPOETIN ALFA 300 MCG/0.6ML IJ SOSY
300.0000 ug | PREFILLED_SYRINGE | Freq: Once | INTRAMUSCULAR | Status: AC
  Administered 2016-03-13: 300 ug via SUBCUTANEOUS
  Filled 2016-03-13: qty 0.6

## 2016-03-13 NOTE — Patient Instructions (Signed)
Darbepoetin Alfa injection What is this medicine? DARBEPOETIN ALFA (dar be POE e tin AL fa) helps your body make more red blood cells. It is used to treat anemia caused by chronic kidney failure and chemotherapy. This medicine may be used for other purposes; ask your health care provider or pharmacist if you have questions. COMMON BRAND NAME(S): Aranesp What should I tell my health care provider before I take this medicine? They need to know if you have any of these conditions: -blood clotting disorders or history of blood clots -cancer patient not on chemotherapy -cystic fibrosis -heart disease, such as angina, heart failure, or a history of a heart attack -hemoglobin level of 12 g/dL or greater -high blood pressure -low levels of folate, iron, or vitamin B12 -seizures -an unusual or allergic reaction to darbepoetin, erythropoietin, albumin, hamster proteins, latex, other medicines, foods, dyes, or preservatives -pregnant or trying to get pregnant -breast-feeding How should I use this medicine? This medicine is for injection into a vein or under the skin. It is usually given by a health care professional in a hospital or clinic setting. If you get this medicine at home, you will be taught how to prepare and give this medicine. Do not shake the solution before you withdraw a dose. Use exactly as directed. Take your medicine at regular intervals. Do not take your medicine more often than directed. It is important that you put your used needles and syringes in a special sharps container. Do not put them in a trash can. If you do not have a sharps container, call your pharmacist or healthcare provider to get one. Talk to your pediatrician regarding the use of this medicine in children. While this medicine may be used in children as young as 1 year for selected conditions, precautions do apply. Overdosage: If you think you have taken too much of this medicine contact a poison control center or  emergency room at once. NOTE: This medicine is only for you. Do not share this medicine with others. What if I miss a dose? If you miss a dose, take it as soon as you can. If it is almost time for your next dose, take only that dose. Do not take double or extra doses. What may interact with this medicine? Do not take this medicine with any of the following medications: -epoetin alfa This list may not describe all possible interactions. Give your health care provider a list of all the medicines, herbs, non-prescription drugs, or dietary supplements you use. Also tell them if you smoke, drink alcohol, or use illegal drugs. Some items may interact with your medicine. What should I watch for while using this medicine? Visit your prescriber or health care professional for regular checks on your progress and for the needed blood tests and blood pressure measurements. It is especially important for the doctor to make sure your hemoglobin level is in the desired range, to limit the risk of potential side effects and to give you the best benefit. Keep all appointments for any recommended tests. Check your blood pressure as directed. Ask your doctor what your blood pressure should be and when you should contact him or her. As your body makes more red blood cells, you may need to take iron, folic acid, or vitamin B supplements. Ask your doctor or health care provider which products are right for you. If you have kidney disease continue dietary restrictions, even though this medication can make you feel better. Talk with your doctor or health   care professional about the foods you eat and the vitamins that you take. What side effects may I notice from receiving this medicine? Side effects that you should report to your doctor or health care professional as soon as possible: -allergic reactions like skin rash, itching or hives, swelling of the face, lips, or tongue -breathing problems -changes in vision -chest  pain -confusion, trouble speaking or understanding -feeling faint or lightheaded, falls -high blood pressure -muscle aches or pains -pain, swelling, warmth in the leg -rapid weight gain -severe headaches -sudden numbness or weakness of the face, arm or leg -trouble walking, dizziness, loss of balance or coordination -seizures (convulsions) -swelling of the ankles, feet, hands -unusually weak or tired Side effects that usually do not require medical attention (report to your doctor or health care professional if they continue or are bothersome): -diarrhea -fever, chills (flu-like symptoms) -headaches -nausea, vomiting -redness, stinging, or swelling at site where injected This list may not describe all possible side effects. Call your doctor for medical advice about side effects. You may report side effects to FDA at 1-800-FDA-1088. Where should I keep my medicine? Keep out of the reach of children. Store in a refrigerator between 2 and 8 degrees C (36 and 46 degrees F). Do not freeze. Do not shake. Throw away any unused portion if using a single-dose vial. Throw away any unused medicine after the expiration date. NOTE: This sheet is a summary. It may not cover all possible information. If you have questions about this medicine, talk to your doctor, pharmacist, or health care provider.  2015, [employer]. (2008-05-10 10:23:57)  

## 2016-03-25 DIAGNOSIS — E7800 Pure hypercholesterolemia, unspecified: Secondary | ICD-10-CM | POA: Diagnosis not present

## 2016-03-25 DIAGNOSIS — R809 Proteinuria, unspecified: Secondary | ICD-10-CM | POA: Diagnosis not present

## 2016-03-25 DIAGNOSIS — E1065 Type 1 diabetes mellitus with hyperglycemia: Secondary | ICD-10-CM | POA: Diagnosis not present

## 2016-03-25 DIAGNOSIS — G609 Hereditary and idiopathic neuropathy, unspecified: Secondary | ICD-10-CM | POA: Diagnosis not present

## 2016-03-29 ENCOUNTER — Other Ambulatory Visit: Payer: Self-pay | Admitting: *Deleted

## 2016-03-29 ENCOUNTER — Encounter: Payer: Self-pay | Admitting: *Deleted

## 2016-03-29 NOTE — Patient Outreach (Signed)
[facility]) Care Management  03/29/2016   Jessica Howard 1961-01-17 [id number]  Subjective: RN Health Coach telephone call to patient.  Hipaa compliance verified. Per patient she went to Dr Bubba Camp on 03/25/2016 Her A1C has decrease to 8.2 from 10.0. Per patient she has lost 7 pounds. Patient is checking and documenting her blood sugars as per ordered. Patient is taking medications as per order. Patient stated she had 2 episodes of high blood sugars over 200 in which she felt like she had cotton in her mouth. Patient stated she felt extreme thirsty. She drank water. Patient stated she woke up from her sleep feeling funny and checked her blood sugar it was 53. She got up and ate peanut butter and drank orange juice.  Patient has not been able to check on joining the [employer] due to family hospitalization. Patient has agreed to follow up outreach calls  Objective:   Current Medications:  Current Outpatient Prescriptions  Medication Sig Dispense Refill  . abatacept (ORENCIA) 250 MG injection Inject 1,000 mg into the vein every 28 (twenty-eight) days. Monthly Infusion    . albuterol (PROVENTIL HFA;VENTOLIN HFA) 108 (90 Base) MCG/ACT inhaler Inhale 2 puffs into the lungs every 6 (six) hours as needed for wheezing or shortness of breath.    Marland Kitchen amitriptyline (ELAVIL) 25 MG tablet Take 25 mg by mouth at bedtime.     . ARTIFICIAL TEAR OP Place 1 drop into both eyes as needed (for dry eyes).    . chlorthalidone (HYGROTON) 25 MG tablet Take 25 mg by mouth daily.    . CRESTOR 20 MG tablet Take 20 mg by mouth daily.     Marland Kitchen diltiazem (CARDIZEM CD) 240 MG 24 hr capsule Take 240 mg by mouth daily.     . fluticasone (FLONASE) 50 MCG/ACT nasal spray Place 1 spray into both nostrils daily as needed for allergies or rhinitis.    Marland Kitchen gabapentin (NEURONTIN) 300 MG capsule Take 600 mg by mouth 3 (three) times daily.     Marland Kitchen HYDROcodone-acetaminophen (NORCO/VICODIN) 5-325 MG tablet Take 1 tablet by mouth  every 6 (six) hours as needed for severe pain. 10 tablet 0  . insulin aspart (NOVOLOG) 100 UNIT/ML injection Inject 55-60 Units into the skin 3 (three) times daily before meals. 55 base Plus sliding scale    . insulin NPH (HUMULIN N,NOVOLIN N) 100 UNIT/ML injection Inject 75 Units into the skin 3 (three) times daily with meals. 75 units with breakfast; lunch 75 units; 75 units at dinner    . irbesartan (AVAPRO) 300 MG tablet Take 300 mg by mouth daily.     Marland Kitchen leflunomide (ARAVA) 20 MG tablet Take 20 mg by mouth daily.    . metFORMIN (GLUCOPHAGE-XR) 500 MG 24 hr tablet Take 500 mg by mouth every evening.    . methocarbamol (ROBAXIN) 500 MG tablet Take 500 mg by mouth every 8 (eight) hours as needed for muscle spasms.    . Nebivolol HCl (BYSTOLIC) 20 MG TABS Take 20 mg by mouth daily.     . pantoprazole (PROTONIX) 40 MG tablet Take 40 mg by mouth daily.    . predniSONE (DELTASONE) 5 MG tablet Take 5 mg by mouth daily.    Marland Kitchen tiZANidine (ZANAFLEX) 4 MG tablet Take 4 mg by mouth 2 (two) times daily as needed (migraines). Limit 2 days per week    . topiramate (TOPAMAX) 100 MG tablet Take 200 mg by mouth daily.     . TRULICITY 1.32  MG/0.5ML SOPN Inject 0.75 mg into the skin once a week. Patient takes on Wednesday.    . VOLTAREN 1 % GEL Apply 1 application topically 3 (three) times daily as needed (for pain).     Marland Kitchen doxycycline (VIBRAMYCIN) 50 MG capsule Take 2 capsules (100 mg total) by mouth 2 (two) times daily. (Patient not taking: Reported on 03/29/2016) 120 capsule 0   No current facility-administered medications for this visit.     Functional Status:  In your present state of health, do you have any difficulty performing the following activities: 03/29/2016 02/28/2016  Hearing? N N  Vision? N N  Difficulty concentrating or making decisions? N N  Walking or climbing stairs? N N  Dressing or bathing? N N  Doing errands, shopping? N N  Preparing Food and eating ? - N  Using the Toilet? - N  In  the past six months, have you accidently leaked urine? - N  Do you have problems with loss of bowel control? - N  Managing your Medications? - N  Managing your Finances? - N  Some recent data might be hidden    Fall/Depression Screening: PHQ 2/9 Scores 03/29/2016 02/28/2016 04/20/2014 02/16/2014  PHQ - 2 Score 0 0 0 0   THN CM Care Plan Problem One   Flowsheet Row Most Recent Value  Care Plan Problem One  Knowledge Deficit in self management of diabetes  Role Documenting the Problem One  [place] for Problem One  Active  THN Long Term Goal (31-90 days)  Patient will see a decrease in Hgb A1C within the next 90 days from 10.0  THN Long Term Goal Start Date  03/29/16  Interventions for Problem One Long Term Goal  RN sent patient educational maaterial on A1C. RN discussed with patient what the A1C means abd how the blood sugars affect the A1C  THN CM Short Term Goal #1 (0-30 days)  Patient will be able to tell the signs and symptoms of hypo and hyperglycemia within the next 30 days  THN CM Short Term Goal #1 Start Date  03/29/16  [facility] CM Short Term Goal #1 Met Date  03/29/16  Interventions for Short Term Goal #1  RN sent patient picture chart of hypo and hyper glycemia. RN sent Emmi educational material on low and high blood sugars. RN discussed with patient her signs and symptoms .  THN CM Short Term Goal #2 (0-30 days)  Patient will be able to describe the treatment for hyper and hypo glycemiawithin 30 days  THN CM Short Term Goal #2 Met Date  03/29/16  Interventions for Short Term Goal #2  RN sent educational material on treatment of hypo and hyperglycemia. RN sent patient living well with Diabetes booklet. RN will follow up with discussion and teach back  THN CM Short Term Goal #3 (0-30 days)  Patient will report checking blood sugars and documenting as per physician order within the next 30 days  THN CM Short Term Goal #3 Met Date  03/29/16  Interventions for Short Tern Goal  #3  RN sent patient a calendar book for documeting blood sugars. RN discussed the importance of checking blood sugars as per physician ordered. RN will monitor blood sugar checks and documentation with follow up outreach calls.  THN CM Short Term Goal #4 (0-30 days)  Patient will be able to describe  healthy snack within the next 30 days  THN CM Short Term Goal #4 Start Date  03/29/16  Interventions for Short Term Goal #4  RN sent a list of healthy snacks.RN will follow up discussion and teach back   THN CM Short Term Goal #5 (0-30 days)  Patient will be able to describe foods low in carbohydrates within the next 30 days  THN CM Short Term Goal #5 Start Date  03/29/16  Interventions for Short Term Goal #5  RN sent EMMI educational material Diabetes food and exercise, Counting carbohydrates. RN sent a chart with high and low carbohydrates pictures. RN will follow up with discussion and teach back       Assessment: Patient A1C decreased to 8.2 on 03/25/2016 Patient lost 7 pounds within the past month Patient will benefit from [place] telephonic outreach for education and support for diabetes self management.   Plan:  RN sent educational material on eating healthy snacks RN sent picture chart on high and low carbohydrates RN sent EMMI educational material Diabetes food and exercise RN sent EMMI educational material on counting carbohydrates RN sent educational material on Diabetes understanding carbohydrates, fats and proteins RN will follow up within the month of November  Seanpatrick [facility]ano [place] [facility] [phone]

## 2016-04-02 ENCOUNTER — Other Ambulatory Visit (HOSPITAL_COMMUNITY): Payer: Self-pay

## 2016-04-02 ENCOUNTER — Ambulatory Visit (INDEPENDENT_AMBULATORY_CARE_PROVIDER_SITE_OTHER): Payer: Medicare Other | Admitting: Ophthalmology

## 2016-04-02 DIAGNOSIS — H43813 Vitreous degeneration, bilateral: Secondary | ICD-10-CM

## 2016-04-02 DIAGNOSIS — E113392 Type 2 diabetes mellitus with moderate nonproliferative diabetic retinopathy without macular edema, left eye: Secondary | ICD-10-CM

## 2016-04-02 DIAGNOSIS — H35033 Hypertensive retinopathy, bilateral: Secondary | ICD-10-CM

## 2016-04-02 DIAGNOSIS — E11311 Type 2 diabetes mellitus with unspecified diabetic retinopathy with macular edema: Secondary | ICD-10-CM

## 2016-04-02 DIAGNOSIS — I10 Essential (primary) hypertension: Secondary | ICD-10-CM

## 2016-04-02 DIAGNOSIS — E113311 Type 2 diabetes mellitus with moderate nonproliferative diabetic retinopathy with macular edema, right eye: Secondary | ICD-10-CM

## 2016-04-03 ENCOUNTER — Ambulatory Visit (HOSPITAL_COMMUNITY)
Admission: RE | Admit: 2016-04-03 | Discharge: 2016-04-03 | Disposition: A | Discharge status: Home / Self Care | Payer: Medicare Other | Source: Ambulatory Visit | Attending: Rheumatology | Admitting: Rheumatology

## 2016-04-03 DIAGNOSIS — M069 Rheumatoid arthritis, unspecified: Secondary | ICD-10-CM | POA: Diagnosis not present

## 2016-04-03 MED ORDER — SODIUM CHLORIDE 0.9 % IV SOLN
1000.0000 mg | INTRAVENOUS | Status: DC
  Administered 2016-04-03: 1000 mg via INTRAVENOUS
  Filled 2016-04-03: qty 30

## 2016-04-10 ENCOUNTER — Other Ambulatory Visit: Payer: Medicare Other

## 2016-04-10 ENCOUNTER — Ambulatory Visit: Payer: Medicare Other

## 2016-04-11 ENCOUNTER — Encounter (HOSPITAL_COMMUNITY): Payer: Self-pay | Admitting: Emergency Medicine

## 2016-04-11 ENCOUNTER — Other Ambulatory Visit (HOSPITAL_BASED_OUTPATIENT_CLINIC_OR_DEPARTMENT_OTHER): Payer: Medicare Other

## 2016-04-11 ENCOUNTER — Ambulatory Visit (HOSPITAL_COMMUNITY)
Admission: EM | Admit: 2016-04-11 | Discharge: 2016-04-11 | Disposition: A | Discharge status: Home / Self Care | Payer: Medicare Other | Attending: Emergency Medicine | Admitting: Emergency Medicine

## 2016-04-11 ENCOUNTER — Ambulatory Visit: Payer: Medicare Other

## 2016-04-11 DIAGNOSIS — D638 Anemia in other chronic diseases classified elsewhere: Secondary | ICD-10-CM | POA: Diagnosis not present

## 2016-04-11 DIAGNOSIS — D569 Thalassemia, unspecified: Secondary | ICD-10-CM

## 2016-04-11 DIAGNOSIS — N764 Abscess of vulva: Secondary | ICD-10-CM | POA: Diagnosis not present

## 2016-04-11 DIAGNOSIS — M069 Rheumatoid arthritis, unspecified: Secondary | ICD-10-CM

## 2016-04-11 DIAGNOSIS — D509 Iron deficiency anemia, unspecified: Secondary | ICD-10-CM

## 2016-04-11 LAB — CBC WITH DIFFERENTIAL/PLATELET
BASO%: 0.7 % (ref 0.0–2.0)
Basophils Absolute: 0.1 10*3/uL (ref 0.0–0.1)
EOS%: 1.4 % (ref 0.0–7.0)
Eosinophils Absolute: 0.1 10*3/uL (ref 0.0–0.5)
HCT: 33.5 % — ABNORMAL LOW (ref 34.8–46.6)
HGB: 10 g/dL — ABNORMAL LOW (ref 11.6–15.9)
LYMPH#: 3.8 10*3/uL — AB (ref 0.9–3.3)
LYMPH%: 43.9 % (ref 14.0–49.7)
MCH: 20.3 pg — ABNORMAL LOW (ref 25.1–34.0)
MCHC: 30 g/dL — ABNORMAL LOW (ref 31.5–36.0)
MCV: 67.9 fL — ABNORMAL LOW (ref 79.5–101.0)
MONO#: 0.9 10*3/uL (ref 0.1–0.9)
MONO%: 10.1 % (ref 0.0–14.0)
NEUT%: 43.9 % (ref 38.4–76.8)
NEUTROS ABS: 3.8 10*3/uL (ref 1.5–6.5)
PLATELETS: 252 10*3/uL (ref 145–400)
RBC: 4.93 10*6/uL (ref 3.70–5.45)
RDW: 18.5 % — AB (ref 11.2–14.5)
WBC: 8.7 10*3/uL (ref 3.9–10.3)

## 2016-04-11 MED ORDER — DOXYCYCLINE HYCLATE 100 MG PO CAPS: 100.0000 mg | ORAL_CAPSULE | Freq: Two times a day (BID) | ORAL | 0 refills | Status: DC

## 2016-04-11 NOTE — ED Triage Notes (Signed)
Here for vaginal abscess onset 1 week   Reports it drained bloody substance on Tuesday and she thought she began to menstruate  A&O x4... NAD

## 2016-04-11 NOTE — ED Provider Notes (Signed)
[place]    CSN: [id number] Arrival date & time: 04/11/16  1539     History   Chief Complaint Chief Complaint  Patient presents with  . Abscess    HPI Jessica Howard is a 55 y.o. female.   HPI She is a 55 year old woman here for labial abscess. It is located on the right posterior labia. It started about a week ago. 2 days ago it ruptured and has been draining bloody fluid. She reports mild tenderness now. No fevers. She used to get these fairly regularly, but hasn't had one for a number of years.  Past Medical History:  Diagnosis Date  . Asthma   . Diabetes mellitus    type 2  . Diabetic neuropathy ([place])   . Endometrial polyp   . Fibroadenoma of breast    left  . GERD (gastroesophageal reflux disease)   . Headache    migraines  . Hepatic steatosis   . Hyperlipidemia   . Hypertension   . Impingement syndrome of right shoulder   . Nontraumatic tear of right rotator cuff   . Pneumonia   . Psoriasis   . Rheumatoid arthritis(714.0)    rhematoid and osteoarthritis  . Sleep apnea   . Thyroid disease    had radiation  . Unspecified deficiency anemia    Thalassemia    Patient Active Problem List   Diagnosis Date Noted  . Elevated C-reactive protein (CRP)   . Elevated erythrocyte sedimentation rate   . Leukocytosis   . Hip joint effusion 01/20/2016  . Right hip pain 01/19/2016  . Hip pain 01/19/2016  . Congestive heart failure (CHF) ([place]) 12/01/2015  . Anemia 12/01/2015  . CHF (congestive heart failure) ([place]) 12/01/2015  . Acute diastolic CHF (congestive heart failure) ([place])   . Right rotator cuff tear 05/20/2014  . Nontraumatic tear of right rotator cuff   . Impingement syndrome of right shoulder   . Thalassemia 02/28/2014  . On prednisone therapy 02/28/2014  . Rheumatoid arthritis ([place]) 02/28/2014  . Morbid obesity ([place]) 02/28/2014  . Shortness of breath 02/28/2014  . Renal insufficiency 02/28/2014  . Edema 02/28/2014  . Back pain  02/28/2014  . Essential hypertension 02/28/2014  . Diabetes ([place]) 02/28/2014  . Dyslipidemia 02/28/2014  . Obstructive sleep apnea 02/28/2014  . Acute on chronic diastolic congestive heart failure, NYHA class 2 ([place]) 02/28/2014  . Chest pain 02/27/2014  . Iron deficiency anemia 02/16/2014  . Post-menopausal bleeding 08/28/2012    Past Surgical History:  Procedure Laterality Date  . ANKLE ARTHROSCOPY Right   . CESAREAN SECTION    . COLONOSCOPY    . DILATION AND CURETTAGE OF UTERUS    . EYE SURGERY     surgery on retina  . HAND SURGERY     for arthritis  . left knee surgery nodule removal    . OTHER SURGICAL HISTORY     multiple nodule removal on neck, hands, left knee  . SHOULDER ARTHROSCOPY WITH ROTATOR CUFF REPAIR AND SUBACROMIAL DECOMPRESSION Right 05/20/2014   Procedure: RIGHT SHOULDER ARTHROSCOPY WITH DEBRIDEMENT/DISTAL CLAVICLE EXCISION/ROTATOR CUFF REPAIR AND SUBACROMIAL DECOMPRESSION;  Surgeon: Lorn Junes, MD;  Location: [facility];  Service: Orthopedics;  Laterality: Right;  . TONSILLECTOMY    . TUBAL LIGATION     bilateral    OB History    Gravida Para Term Preterm AB Living   3 2 2  0 1 2   SAB TAB Ectopic Multiple Live Births   1 0 0  0         Home Medications    Prior to Admission medications   Medication Sig Start Date End Date Taking? Authorizing Provider  abatacept (ORENCIA) 250 MG injection Inject 1,000 mg into the vein every 28 (twenty-eight) days. Monthly Infusion   Yes Historical Provider, MD  amitriptyline (ELAVIL) 25 MG tablet Take 25 mg by mouth at bedtime.    Yes Historical Provider, MD  ARTIFICIAL TEAR OP Place 1 drop into both eyes as needed (for dry eyes).   Yes Historical Provider, MD  chlorthalidone (HYGROTON) 25 MG tablet Take 25 mg by mouth daily.   Yes Historical Provider, MD  CRESTOR 20 MG tablet Take 20 mg by mouth daily.  03/14/14  Yes Historical Provider, MD  diltiazem (CARDIZEM CD) 240 MG 24 hr capsule Take 240 mg by mouth daily.   02/03/14  Yes Historical Provider, MD  gabapentin (NEURONTIN) 300 MG capsule Take 600 mg by mouth 3 (three) times daily.    Yes Historical Provider, MD  insulin aspart (NOVOLOG) 100 UNIT/ML injection Inject 55-60 Units into the skin 3 (three) times daily before meals. 55 base Plus sliding scale   Yes Historical Provider, MD  insulin NPH (HUMULIN N,NOVOLIN N) 100 UNIT/ML injection Inject 75 Units into the skin 3 (three) times daily with meals. 75 units with breakfast; lunch 75 units; 75 units at dinner   Yes Historical Provider, MD  irbesartan (AVAPRO) 300 MG tablet Take 300 mg by mouth daily.    Yes Historical Provider, MD  leflunomide (ARAVA) 20 MG tablet Take 20 mg by mouth daily.   Yes Historical Provider, MD  metFORMIN (GLUCOPHAGE-XR) 500 MG 24 hr tablet Take 500 mg by mouth every evening.   Yes Historical Provider, MD  Nebivolol HCl (BYSTOLIC) 20 MG TABS Take 20 mg by mouth daily.    Yes Historical Provider, MD  predniSONE (DELTASONE) 5 MG tablet Take 5 mg by mouth daily.   Yes Historical Provider, MD  tiZANidine (ZANAFLEX) 4 MG tablet Take 4 mg by mouth 2 (two) times daily as needed (migraines). Limit 2 days per week 01/12/14  Yes Historical Provider, MD  topiramate (TOPAMAX) 100 MG tablet Take 200 mg by mouth daily.  03/14/14  Yes Historical Provider, MD  TRULICITY [id number] SOPN Inject 0.75 mg into the skin once a week. Patient takes on Wednesday. 08/29/15  Yes Historical Provider, MD  VOLTAREN 1 % GEL Apply 1 application topically 3 (three) times daily as needed (for pain).  02/03/14  Yes Historical Provider, MD  albuterol (PROVENTIL HFA;VENTOLIN HFA) 108 (90 Base) MCG/ACT inhaler Inhale 2 puffs into the lungs every 6 (six) hours as needed for wheezing or shortness of breath.    Historical Provider, MD  doxycycline (VIBRAMYCIN) 100 MG capsule Take 1 capsule (100 mg total) by mouth 2 (two) times daily. 04/11/16   Melony Overly, MD  fluticasone (FLONASE) 50 MCG/ACT nasal spray Place 1 spray into  both nostrils daily as needed for allergies or rhinitis.    Historical Provider, MD  HYDROcodone-acetaminophen (NORCO/VICODIN) 5-325 MG tablet Take 1 tablet by mouth every 6 (six) hours as needed for severe pain. 01/21/16   Wallis Bamberg, MD  methocarbamol (ROBAXIN) 500 MG tablet Take 500 mg by mouth every 8 (eight) hours as needed for muscle spasms.    Historical Provider, MD  pantoprazole (PROTONIX) 40 MG tablet Take 40 mg by mouth daily.    Historical Provider, MD    Family History Family History  Problem Relation Age of Onset  . Diabetes Mother   . Cancer Mother     breast  . Aneurysm Father     brain  . Diabetes Sister   . Cancer Brother     bone marrow cancer    Social History Social History  Substance Use Topics  . Smoking status: Former Smoker    Packs/day: 0.25    Years: 6.00    Types: Cigarettes    Quit date: 06/09/2013  . Smokeless tobacco: Never Used  . Alcohol use No     Allergies   Bactrim [sulfamethoxazole-trimethoprim]; Cefuroxime axetil; Cephalosporins; Iohexol; Lisinopril; Penicillins; and Sulfa antibiotics   Review of Systems Review of Systems As in history of present illness  Physical Exam Triage Vital Signs ED Triage Vitals  Enc Vitals Group     BP 04/11/16 1629 152/69     Pulse Rate 04/11/16 1629 79     Resp 04/11/16 1629 20     Temp 04/11/16 1629 98.6 F (37 C)     Temp Source 04/11/16 1629 Oral     SpO2 04/11/16 1629 100 %     Weight --      Height --      Head Circumference --      Peak Flow --      Pain Score 04/11/16 1633 1     Pain Loc --      Pain Edu? --      Excl. in [place]? --    No data found.   Updated Vital Signs BP 152/69 (BP Location: Left Arm)   Pulse 79   Temp 98.6 F (37 C) (Oral)   Resp 20   LMP 12/27/2013 Comment: irreggular  SpO2 100%   Visual Acuity Right Eye Distance:   Left Eye Distance:   Bilateral Distance:    Right Eye Near:   Left Eye Near:    Bilateral Near:     Physical Exam    Constitutional: She is oriented to person, place, and time. She appears well-developed and well-nourished. No distress.  Cardiovascular: Normal rate.   Pulmonary/Chest: Effort normal.  Genitourinary:     Neurological: She is alert and oriented to person, place, and time.     UC Treatments / Results  Labs (all labs ordered are listed, but only abnormal results are displayed) Labs Reviewed - No data to display  EKG  EKG Interpretation None       Radiology No results found.  Procedures Procedures (including critical care time)  Medications Ordered in UC Medications - No data to display   Initial Impression / Assessment and Plan / UC Course  I have reviewed the triage vital signs and the nursing notes.  Pertinent labs & imaging results that were available during my care of the patient were reviewed by me and considered in my medical decision making (see chart for details).  Clinical Course    Given lingering induration, will treat with doxycycline. Follow-up as needed.  Final Clinical Impressions(s) / UC Diagnoses   Final diagnoses:  Abscess of right genital labia    New Prescriptions New Prescriptions   DOXYCYCLINE (VIBRAMYCIN) 100 MG CAPSULE    Take 1 capsule (100 mg total) by mouth 2 (two) times daily.     Melony Overly, MD 04/11/16 [phone]

## 2016-04-11 NOTE — Progress Notes (Signed)
Hgb at 10 today, no injection needed. Pt given copy of labs and current schedule, instructed to keep follow up appointments and to call office if issues occur. Verbalized understanding of instructions.

## 2016-04-11 NOTE — Discharge Instructions (Signed)
The abscess has already ruptured. Take doxycycline twice a day for 10 days. You can apply Neosporin or do sitz bath if tere is discomfort. You may see a little more drainage over the next 2 days. Follow-up as needed.

## 2016-04-16 ENCOUNTER — Other Ambulatory Visit: Payer: Self-pay | Admitting: Internal Medicine

## 2016-04-16 DIAGNOSIS — Z1231 Encounter for screening mammogram for malignant neoplasm of breast: Secondary | ICD-10-CM

## 2016-04-30 ENCOUNTER — Other Ambulatory Visit: Payer: Self-pay | Admitting: *Deleted

## 2016-04-30 NOTE — Patient Outreach (Signed)
[facility]) Care Management  04/30/2016  Jessica Howard July 21, 1960 [id number]   RN Health Coach  attempted  #1 Follow up outreach call to patient.  Patient was unavailable. HIPPA compliance voicemail message was left with return callback number.  Plan: RN will call patient again within 14 days.   [place] [facility] Management [phone]

## 2016-05-01 ENCOUNTER — Encounter (HOSPITAL_COMMUNITY): Payer: Medicare Other

## 2016-05-06 ENCOUNTER — Other Ambulatory Visit (HOSPITAL_COMMUNITY): Payer: Self-pay | Admitting: *Deleted

## 2016-05-07 ENCOUNTER — Ambulatory Visit (HOSPITAL_COMMUNITY)
Admission: RE | Admit: 2016-05-07 | Discharge: 2016-05-07 | Disposition: A | Discharge status: Home / Self Care | Payer: Medicare Other | Source: Ambulatory Visit | Attending: Rheumatology | Admitting: Rheumatology

## 2016-05-07 DIAGNOSIS — N289 Disorder of kidney and ureter, unspecified: Secondary | ICD-10-CM | POA: Diagnosis not present

## 2016-05-07 MED ORDER — SODIUM CHLORIDE 0.9 % IV SOLN
1000.0000 mg | INTRAVENOUS | Status: DC
  Administered 2016-05-07: 1000 mg via INTRAVENOUS
  Filled 2016-05-07: qty 10

## 2016-05-07 MED ORDER — SODIUM CHLORIDE 0.9 % IV SOLN
INTRAVENOUS | Status: DC
  Administered 2016-05-07: 12:00:00 via INTRAVENOUS

## 2016-05-08 ENCOUNTER — Telehealth: Payer: Self-pay | Admitting: Oncology

## 2016-05-08 ENCOUNTER — Ambulatory Visit (HOSPITAL_BASED_OUTPATIENT_CLINIC_OR_DEPARTMENT_OTHER): Payer: Medicare Other | Admitting: Oncology

## 2016-05-08 ENCOUNTER — Ambulatory Visit (HOSPITAL_BASED_OUTPATIENT_CLINIC_OR_DEPARTMENT_OTHER): Payer: Medicare Other

## 2016-05-08 ENCOUNTER — Other Ambulatory Visit (HOSPITAL_BASED_OUTPATIENT_CLINIC_OR_DEPARTMENT_OTHER): Payer: Medicare Other

## 2016-05-08 VITALS — BP 170/79 | HR 93 | Temp 98.0°F | Resp 18 | Ht 63.0 in | Wt 289.9 lb

## 2016-05-08 VITALS — BP 155/72

## 2016-05-08 DIAGNOSIS — D509 Iron deficiency anemia, unspecified: Secondary | ICD-10-CM | POA: Diagnosis not present

## 2016-05-08 DIAGNOSIS — D563 Thalassemia minor: Secondary | ICD-10-CM

## 2016-05-08 DIAGNOSIS — D569 Thalassemia, unspecified: Secondary | ICD-10-CM | POA: Diagnosis not present

## 2016-05-08 DIAGNOSIS — M069 Rheumatoid arthritis, unspecified: Secondary | ICD-10-CM

## 2016-05-08 DIAGNOSIS — D638 Anemia in other chronic diseases classified elsewhere: Secondary | ICD-10-CM | POA: Diagnosis not present

## 2016-05-08 DIAGNOSIS — N289 Disorder of kidney and ureter, unspecified: Secondary | ICD-10-CM

## 2016-05-08 LAB — CBC WITH DIFFERENTIAL/PLATELET
BASO%: 0.3 % (ref 0.0–2.0)
BASOS ABS: 0 10*3/uL (ref 0.0–0.1)
EOS%: 1.3 % (ref 0.0–7.0)
Eosinophils Absolute: 0.1 10*3/uL (ref 0.0–0.5)
HEMATOCRIT: 30.7 % — AB (ref 34.8–46.6)
HEMOGLOBIN: 9.4 g/dL — AB (ref 11.6–15.9)
LYMPH#: 3.1 10*3/uL (ref 0.9–3.3)
LYMPH%: 45.8 % (ref 14.0–49.7)
MCH: 20.4 pg — AB (ref 25.1–34.0)
MCHC: 30.6 g/dL — ABNORMAL LOW (ref 31.5–36.0)
MCV: 66.6 fL — AB (ref 79.5–101.0)
MONO#: 0.8 10*3/uL (ref 0.1–0.9)
MONO%: 11.6 % (ref 0.0–14.0)
NEUT#: 2.7 10*3/uL (ref 1.5–6.5)
NEUT%: 41 % (ref 38.4–76.8)
Platelets: 250 10*3/uL (ref 145–400)
RBC: 4.61 10*6/uL (ref 3.70–5.45)
RDW: 17.9 % — ABNORMAL HIGH (ref 11.2–14.5)
WBC: 6.7 10*3/uL (ref 3.9–10.3)
nRBC: 1 % — ABNORMAL HIGH (ref 0–0)

## 2016-05-08 MED ORDER — DARBEPOETIN ALFA 300 MCG/0.6ML IJ SOSY
300.0000 ug | PREFILLED_SYRINGE | Freq: Once | INTRAMUSCULAR | Status: AC
  Administered 2016-05-08: 300 ug via SUBCUTANEOUS
  Filled 2016-05-08: qty 0.6

## 2016-05-08 NOTE — Telephone Encounter (Signed)
Appointments scheduled per 05/08/16 los. A copy of the AVS report and appointment schedule was given to the patient, per 05/08/16 los. °

## 2016-05-08 NOTE — Patient Instructions (Signed)
Darbepoetin Alfa injection What is this medicine? DARBEPOETIN ALFA (dar be POE e tin AL fa) helps your body make more red blood cells. It is used to treat anemia caused by chronic kidney failure and chemotherapy. This medicine may be used for other purposes; ask your health care provider or pharmacist if you have questions. COMMON BRAND NAME(S): Aranesp What should I tell my health care provider before I take this medicine? They need to know if you have any of these conditions: -blood clotting disorders or history of blood clots -cancer patient not on chemotherapy -cystic fibrosis -heart disease, such as angina, heart failure, or a history of a heart attack -hemoglobin level of 12 g/dL or greater -high blood pressure -low levels of folate, iron, or vitamin B12 -seizures -an unusual or allergic reaction to darbepoetin, erythropoietin, albumin, hamster proteins, latex, other medicines, foods, dyes, or preservatives -pregnant or trying to get pregnant -breast-feeding How should I use this medicine? This medicine is for injection into a vein or under the skin. It is usually given by a health care professional in a hospital or clinic setting. If you get this medicine at home, you will be taught how to prepare and give this medicine. Do not shake the solution before you withdraw a dose. Use exactly as directed. Take your medicine at regular intervals. Do not take your medicine more often than directed. It is important that you put your used needles and syringes in a special sharps container. Do not put them in a trash can. If you do not have a sharps container, call your pharmacist or healthcare provider to get one. Talk to your pediatrician regarding the use of this medicine in children. While this medicine may be used in children as young as 1 year for selected conditions, precautions do apply. Overdosage: If you think you have taken too much of this medicine contact a poison control center or  emergency room at once. NOTE: This medicine is only for you. Do not share this medicine with others. What if I miss a dose? If you miss a dose, take it as soon as you can. If it is almost time for your next dose, take only that dose. Do not take double or extra doses. What may interact with this medicine? Do not take this medicine with any of the following medications: -epoetin alfa This list may not describe all possible interactions. Give your health care provider a list of all the medicines, herbs, non-prescription drugs, or dietary supplements you use. Also tell them if you smoke, drink alcohol, or use illegal drugs. Some items may interact with your medicine. What should I watch for while using this medicine? Visit your prescriber or health care professional for regular checks on your progress and for the needed blood tests and blood pressure measurements. It is especially important for the doctor to make sure your hemoglobin level is in the desired range, to limit the risk of potential side effects and to give you the best benefit. Keep all appointments for any recommended tests. Check your blood pressure as directed. Ask your doctor what your blood pressure should be and when you should contact him or her. As your body makes more red blood cells, you may need to take iron, folic acid, or vitamin B supplements. Ask your doctor or health care provider which products are right for you. If you have kidney disease continue dietary restrictions, even though this medication can make you feel better. Talk with your doctor or health   care professional about the foods you eat and the vitamins that you take. What side effects may I notice from receiving this medicine? Side effects that you should report to your doctor or health care professional as soon as possible: -allergic reactions like skin rash, itching or hives, swelling of the face, lips, or tongue -breathing problems -changes in vision -chest  pain -confusion, trouble speaking or understanding -feeling faint or lightheaded, falls -high blood pressure -muscle aches or pains -pain, swelling, warmth in the leg -rapid weight gain -severe headaches -sudden numbness or weakness of the face, arm or leg -trouble walking, dizziness, loss of balance or coordination -seizures (convulsions) -swelling of the ankles, feet, hands -unusually weak or tired Side effects that usually do not require medical attention (report to your doctor or health care professional if they continue or are bothersome): -diarrhea -fever, chills (flu-like symptoms) -headaches -nausea, vomiting -redness, stinging, or swelling at site where injected This list may not describe all possible side effects. Call your doctor for medical advice about side effects. You may report side effects to FDA at 1-800-FDA-1088. Where should I keep my medicine? Keep out of the reach of children. Store in a refrigerator between 2 and 8 degrees C (36 and 46 degrees F). Do not freeze. Do not shake. Throw away any unused portion if using a single-dose vial. Throw away any unused medicine after the expiration date. NOTE: This sheet is a summary. It may not cover all possible information. If you have questions about this medicine, talk to your doctor, pharmacist, or health care provider.  2015, [employer]. (2008-05-10 10:23:57)  

## 2016-05-08 NOTE — Progress Notes (Signed)
Hematology and Oncology Follow Up Visit  Jessica Howard [id number] [birth year] 55 y.o. 05/08/2016 10:53 AM   Principle Diagnosis: This is a 55 year old female with anemia of renal disease. She has also microcytic anemia due to beta thalassemia. She also has element of anemia of chronic disease with history of rheumatoid arthritis.  Current therapy: Aranesp 300 g every 4 weeks to keep her hemoglobin above 10.  Interim History:  Jessica Howard presents today for a follow-up visit. Since her last visit, she reports no changes in her health. She continues to receive Aranesp injections without complications. She feels improvement in her quality of life after she receives them.. She denied any thrombosis or bleeding episodes. Her blood pressure have been reasonably controlled.  She reports her energy and performance status is improved on these injections. She denied any GI bleeding complaints.  She did not report headaches or blurry vision or syncope. She did not report any chest pain or palpitation. She is not reporting nausea, vomiting, abdominal pain. She does not report any hematochezia or bleeding per rectum. Does not report any frequency urgency or hesitancy. She does not report any hematuria hematochezia or melena. Remainder of her review of systems unremarkable.   Medications: I have reviewed the patient's current medications.  Current Outpatient Prescriptions  Medication Sig Dispense Refill  . abatacept (ORENCIA) 250 MG injection Inject 1,000 mg into the vein every 28 (twenty-eight) days. Monthly Infusion    . albuterol (PROVENTIL HFA;VENTOLIN HFA) 108 (90 Base) MCG/ACT inhaler Inhale 2 puffs into the lungs every 6 (six) hours as needed for wheezing or shortness of breath.    Marland Kitchen amitriptyline (ELAVIL) 25 MG tablet Take 25 mg by mouth at bedtime.     . ARTIFICIAL TEAR OP Place 1 drop into both eyes as needed (for dry eyes).    . chlorthalidone (HYGROTON) 25 MG tablet Take 25 mg by mouth  daily.    . CRESTOR 20 MG tablet Take 20 mg by mouth daily.     Marland Kitchen diltiazem (CARDIZEM CD) 240 MG 24 hr capsule Take 240 mg by mouth daily.     Marland Kitchen doxycycline (VIBRAMYCIN) 100 MG capsule Take 1 capsule (100 mg total) by mouth 2 (two) times daily. 20 capsule 0  . fluticasone (FLONASE) 50 MCG/ACT nasal spray Place 1 spray into both nostrils daily as needed for allergies or rhinitis.    Marland Kitchen gabapentin (NEURONTIN) 300 MG capsule Take 600 mg by mouth 3 (three) times daily.     Marland Kitchen HYDROcodone-acetaminophen (NORCO/VICODIN) 5-325 MG tablet Take 1 tablet by mouth every 6 (six) hours as needed for severe pain. 10 tablet 0  . insulin aspart (NOVOLOG) 100 UNIT/ML injection Inject 55-60 Units into the skin 3 (three) times daily before meals. 55 base Plus sliding scale    . insulin NPH (HUMULIN N,NOVOLIN N) 100 UNIT/ML injection Inject 75 Units into the skin 3 (three) times daily with meals. 75 units with breakfast; lunch 75 units; 75 units at dinner    . irbesartan (AVAPRO) 300 MG tablet Take 300 mg by mouth daily.     Marland Kitchen leflunomide (ARAVA) 20 MG tablet Take 20 mg by mouth daily.    . metFORMIN (GLUCOPHAGE-XR) 500 MG 24 hr tablet Take 500 mg by mouth every evening.    . methocarbamol (ROBAXIN) 500 MG tablet Take 500 mg by mouth every 8 (eight) hours as needed for muscle spasms.    . Nebivolol HCl (BYSTOLIC) 20 MG TABS Take 20 mg by mouth daily.     Marland Kitchen  pantoprazole (PROTONIX) 40 MG tablet Take 40 mg by mouth daily.    . predniSONE (DELTASONE) 5 MG tablet Take 5 mg by mouth daily.    Marland Kitchen tiZANidine (ZANAFLEX) 4 MG tablet Take 4 mg by mouth 2 (two) times daily as needed (migraines). Limit 2 days per week    . topiramate (TOPAMAX) 100 MG tablet Take 200 mg by mouth daily.     . TRULICITY A999333 0000000 SOPN Inject 0.75 mg into the skin once a week. Patient takes on Wednesday.    . VOLTAREN 1 % GEL Apply 1 application topically 3 (three) times daily as needed (for pain).      No current facility-administered medications  for this visit.     Allergies:  Allergies  Allergen Reactions  . Bactrim [Sulfamethoxazole-Trimethoprim] Hives  . Cefuroxime Axetil Itching  . Cephalosporins Itching  . Iohexol Itching and Rash     Code: RASH, Desc: HAD ITCHING AND A RASH ABOUT ONE HOUR AFTER RETURNING HOME FROM THE CT, Onset Date: [id number]   . Lisinopril Cough  . Penicillins Hives  . Sulfa Antibiotics Hives    Past Medical History, Surgical history, Social history, and Family History were reviewed and updated.   Blood pressure (!) 170/79, pulse 93, temperature 98 F (36.7 C), temperature source Oral, resp. rate 18, height 5\' 3"  (1.6 m), weight 289 lb 14.4 oz (131.5 kg), last menstrual period 12/27/2013, SpO2 98 %. ECOG: 1 General appearance: Alert, awake woman without distress. Head: Normocephalic, without obvious abnormality. No oral thrush noted. Neck: no adenopathy Lymph nodes: Cervical, supraclavicular, and axillary nodes normal. Heart:regular rate and rhythm, S1, S2 normal, no murmur, click, rub or gallop Lung:chest clear, no wheezing, rales, normal symmetric air entry Abdomin: soft, non-tender, without masses or organomegaly. No rebound or guarding. EXT: Mild edema noted in her lower extremities.    CBC    Component Value Date/Time   WBC 6.7 05/08/2016 0934   WBC 11.9 (H) 01/19/2016 1350   RBC 4.61 05/08/2016 0934   RBC 5.08 01/19/2016 1350   HGB 9.4 (L) 05/08/2016 0934   HCT 30.7 (L) 05/08/2016 0934   PLT 250 05/08/2016 0934   MCV 66.6 (L) 05/08/2016 0934   MCH 20.4 (L) 05/08/2016 0934   MCH 20.1 (L) 01/19/2016 1350   MCHC 30.6 (L) 05/08/2016 0934   MCHC 30.1 01/19/2016 1350   RDW 17.9 (H) 05/08/2016 0934   LYMPHSABS 3.1 05/08/2016 0934   MONOABS 0.8 05/08/2016 0934   EOSABS 0.1 05/08/2016 0934   BASOSABS 0.0 05/08/2016 0934       Impression and Plan:  55 year old female with the following issues:   1. Anemia of renal insufficiency: Her hemoglobin today is 9.4 and we will require  Aranesp. She has tolerated this therapy well and the plan is to continue with the monthly monitoring and she will receive Aranesp to boost her hemoglobin above 10. Risks and benefits of continuing this treatment were reviewed today and she is agreeable to continue.  2. 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 Her iron studies in September 2017 showed normal range.  3. Rheumatoid arthritis: Likely contributing to chronic anemia related to chronic inflammatory process. She is currently on Orencia which have helped her symptoms. In  4. Follow-up: Will be in 3 months to monitor her clinical status.  [id number], MD 11/29/201710:53 AM

## 2016-05-09 DIAGNOSIS — Z83511 Family history of glaucoma: Secondary | ICD-10-CM | POA: Diagnosis not present

## 2016-05-09 DIAGNOSIS — H40023 Open angle with borderline findings, high risk, bilateral: Secondary | ICD-10-CM | POA: Diagnosis not present

## 2016-05-09 DIAGNOSIS — H40053 Ocular hypertension, bilateral: Secondary | ICD-10-CM | POA: Diagnosis not present

## 2016-05-09 DIAGNOSIS — H5340 Unspecified visual field defects: Secondary | ICD-10-CM | POA: Diagnosis not present

## 2016-05-13 ENCOUNTER — Ambulatory Visit
Admission: RE | Admit: 2016-05-13 | Discharge: 2016-05-13 | Disposition: A | Discharge status: Home / Self Care | Payer: Medicare Other | Source: Ambulatory Visit | Attending: Internal Medicine | Admitting: Internal Medicine

## 2016-05-13 DIAGNOSIS — Z1231 Encounter for screening mammogram for malignant neoplasm of breast: Secondary | ICD-10-CM | POA: Diagnosis not present

## 2016-05-14 ENCOUNTER — Ambulatory Visit: Payer: Self-pay | Admitting: *Deleted

## 2016-05-22 ENCOUNTER — Other Ambulatory Visit: Payer: Self-pay | Admitting: Dermatology

## 2016-05-22 DIAGNOSIS — L709 Acne, unspecified: Secondary | ICD-10-CM | POA: Diagnosis not present

## 2016-05-22 DIAGNOSIS — D492 Neoplasm of unspecified behavior of bone, soft tissue, and skin: Secondary | ICD-10-CM | POA: Diagnosis not present

## 2016-05-22 DIAGNOSIS — L986 Other infiltrative disorders of the skin and subcutaneous tissue: Secondary | ICD-10-CM | POA: Diagnosis not present

## 2016-05-22 DIAGNOSIS — L739 Follicular disorder, unspecified: Secondary | ICD-10-CM | POA: Diagnosis not present

## 2016-05-31 DIAGNOSIS — Z4802 Encounter for removal of sutures: Secondary | ICD-10-CM | POA: Diagnosis not present

## 2016-06-05 ENCOUNTER — Other Ambulatory Visit (HOSPITAL_BASED_OUTPATIENT_CLINIC_OR_DEPARTMENT_OTHER): Payer: Medicare Other

## 2016-06-05 ENCOUNTER — Ambulatory Visit: Payer: Medicare Other

## 2016-06-05 DIAGNOSIS — D569 Thalassemia, unspecified: Secondary | ICD-10-CM

## 2016-06-05 DIAGNOSIS — D509 Iron deficiency anemia, unspecified: Secondary | ICD-10-CM

## 2016-06-05 LAB — CBC WITH DIFFERENTIAL/PLATELET
BASO%: 0.8 % (ref 0.0–2.0)
Basophils Absolute: 0.1 10*3/uL (ref 0.0–0.1)
EOS%: 0.8 % (ref 0.0–7.0)
Eosinophils Absolute: 0.1 10*3/uL (ref 0.0–0.5)
HEMATOCRIT: 34 % — AB (ref 34.8–46.6)
HEMOGLOBIN: 10.3 g/dL — AB (ref 11.6–15.9)
LYMPH#: 1.7 10*3/uL (ref 0.9–3.3)
LYMPH%: 19.4 % (ref 14.0–49.7)
MCH: 20.6 pg — ABNORMAL LOW (ref 25.1–34.0)
MCHC: 30.4 g/dL — AB (ref 31.5–36.0)
MCV: 67.7 fL — ABNORMAL LOW (ref 79.5–101.0)
MONO#: 0.7 10*3/uL (ref 0.1–0.9)
MONO%: 8.3 % (ref 0.0–14.0)
NEUT#: 6.2 10*3/uL (ref 1.5–6.5)
NEUT%: 70.7 % (ref 38.4–76.8)
PLATELETS: 250 10*3/uL (ref 145–400)
RBC: 5.02 10*6/uL (ref 3.70–5.45)
RDW: 17.8 % — AB (ref 11.2–14.5)
WBC: 8.8 10*3/uL (ref 3.9–10.3)

## 2016-06-05 LAB — COMPREHENSIVE METABOLIC PANEL
ALT: 14 U/L (ref 0–55)
ANION GAP: 7 meq/L (ref 3–11)
AST: 16 U/L (ref 5–34)
Albumin: 3.4 g/dL — ABNORMAL LOW (ref 3.5–5.0)
Alkaline Phosphatase: 106 U/L (ref 40–150)
BILIRUBIN TOTAL: 0.29 mg/dL (ref 0.20–1.20)
BUN: 19.9 mg/dL (ref 7.0–26.0)
CALCIUM: 8.9 mg/dL (ref 8.4–10.4)
CHLORIDE: 113 meq/L — AB (ref 98–109)
CO2: 20 mEq/L — ABNORMAL LOW (ref 22–29)
CREATININE: 1 mg/dL (ref 0.6–1.1)
EGFR: 78 mL/min/{1.73_m2} — ABNORMAL LOW (ref >90)
Glucose: 348 mg/dl — ABNORMAL HIGH (ref 70–140)
Potassium: 4.3 mEq/L (ref 3.5–5.1)
Sodium: 140 mEq/L (ref 136–145)
Total Protein: 7.1 g/dL (ref 6.4–8.3)

## 2016-06-05 LAB — IRON AND TIBC
%SAT: 31 % (ref 21–57)
Iron: 75 ug/dL (ref 41–142)
TIBC: 240 ug/dL (ref 236–444)
UIBC: 165 ug/dL (ref 120–384)

## 2016-06-05 LAB — FERRITIN: FERRITIN: 306 ng/mL — AB (ref 9–269)

## 2016-06-05 NOTE — Progress Notes (Signed)
Hemoglobin noted at 10. 3 on labs today. No injection needed per protocol order. Pt given copy of labs and current schedule.  Instructed to follow schedule and call office should issues occur. Pt verbalized understanding of instructions.

## 2016-06-06 ENCOUNTER — Encounter (HOSPITAL_COMMUNITY)
Admission: RE | Admit: 2016-06-06 | Discharge: 2016-06-06 | Disposition: A | Discharge status: Home / Self Care | Payer: Medicare Other | Source: Ambulatory Visit | Attending: Rheumatology | Admitting: Rheumatology

## 2016-06-06 DIAGNOSIS — M069 Rheumatoid arthritis, unspecified: Secondary | ICD-10-CM | POA: Insufficient documentation

## 2016-06-06 DIAGNOSIS — D509 Iron deficiency anemia, unspecified: Secondary | ICD-10-CM | POA: Diagnosis not present

## 2016-06-06 DIAGNOSIS — N289 Disorder of kidney and ureter, unspecified: Secondary | ICD-10-CM | POA: Insufficient documentation

## 2016-06-06 DIAGNOSIS — D649 Anemia, unspecified: Secondary | ICD-10-CM | POA: Diagnosis present

## 2016-06-06 MED ORDER — SODIUM CHLORIDE 0.9 % IV SOLN
INTRAVENOUS | Status: DC
  Administered 2016-06-06: 10:00:00 via INTRAVENOUS

## 2016-06-06 MED ORDER — SODIUM CHLORIDE 0.9 % IV SOLN
1000.0000 mg | INTRAVENOUS | Status: DC
  Administered 2016-06-06: 10:00:00 1000 mg via INTRAVENOUS
  Filled 2016-06-06: qty 20

## 2016-06-17 ENCOUNTER — Ambulatory Visit (INDEPENDENT_AMBULATORY_CARE_PROVIDER_SITE_OTHER): Payer: Medicare Other | Admitting: Podiatry

## 2016-06-17 ENCOUNTER — Encounter: Payer: Self-pay | Admitting: Podiatry

## 2016-06-17 ENCOUNTER — Ambulatory Visit (INDEPENDENT_AMBULATORY_CARE_PROVIDER_SITE_OTHER): Payer: Medicare Other

## 2016-06-17 VITALS — BP 175/86 | HR 84 | Resp 16

## 2016-06-17 DIAGNOSIS — E1149 Type 2 diabetes mellitus with other diabetic neurological complication: Secondary | ICD-10-CM | POA: Diagnosis not present

## 2016-06-17 DIAGNOSIS — M79675 Pain in left toe(s): Secondary | ICD-10-CM

## 2016-06-17 DIAGNOSIS — M21619 Bunion of unspecified foot: Secondary | ICD-10-CM

## 2016-06-17 DIAGNOSIS — E1140 Type 2 diabetes mellitus with diabetic neuropathy, unspecified: Secondary | ICD-10-CM | POA: Diagnosis not present

## 2016-06-17 DIAGNOSIS — Q828 Other specified congenital malformations of skin: Secondary | ICD-10-CM | POA: Diagnosis not present

## 2016-06-17 NOTE — Progress Notes (Signed)
Subjective:     Patient ID: Jessica Howard, female   DOB: [birth year], 56 y.o.   MRN: [id number]  HPI patient states that I have a digital deformity of my left foot with lesion formation and structural bunion and also discoloration of several nailbeds   Review of Systems  All other systems reviewed and are negative.      Objective:   Physical Exam Neurovascular status found to be intact muscle strength was adequate range of motion within adequate range. Patient's found to have lesion formation left second digit with pain incurvation of the nailbed with structural bunion deformity    Assessment:     Hammertoe deformity with lesion formation and diabetes of an at wrists nature    Plan:     Patient has moderate neurological loss with digital deformity second digit left and structural changes with keratotic tissue formation. Debrided lesion with no iatrogenic bleeding applied padding and instructed to see back when symptomatic

## 2016-07-03 ENCOUNTER — Ambulatory Visit (HOSPITAL_BASED_OUTPATIENT_CLINIC_OR_DEPARTMENT_OTHER): Payer: Medicare Other

## 2016-07-03 ENCOUNTER — Other Ambulatory Visit (HOSPITAL_BASED_OUTPATIENT_CLINIC_OR_DEPARTMENT_OTHER): Payer: Medicare Other

## 2016-07-03 VITALS — BP 128/66 | HR 76 | Temp 98.6°F | Resp 18

## 2016-07-03 DIAGNOSIS — N289 Disorder of kidney and ureter, unspecified: Secondary | ICD-10-CM

## 2016-07-03 DIAGNOSIS — D638 Anemia in other chronic diseases classified elsewhere: Secondary | ICD-10-CM | POA: Diagnosis not present

## 2016-07-03 DIAGNOSIS — M069 Rheumatoid arthritis, unspecified: Secondary | ICD-10-CM | POA: Diagnosis present

## 2016-07-03 DIAGNOSIS — D509 Iron deficiency anemia, unspecified: Secondary | ICD-10-CM

## 2016-07-03 DIAGNOSIS — D569 Thalassemia, unspecified: Secondary | ICD-10-CM

## 2016-07-03 LAB — CBC WITH DIFFERENTIAL/PLATELET
BASO%: 0.4 % (ref 0.0–2.0)
Basophils Absolute: 0 10*3/uL (ref 0.0–0.1)
EOS%: 1.4 % (ref 0.0–7.0)
Eosinophils Absolute: 0.1 10*3/uL (ref 0.0–0.5)
HCT: 31.6 % — ABNORMAL LOW (ref 34.8–46.6)
HEMOGLOBIN: 9.7 g/dL — AB (ref 11.6–15.9)
LYMPH#: 3.4 10*3/uL — AB (ref 0.9–3.3)
LYMPH%: 34.7 % (ref 14.0–49.7)
MCH: 20.3 pg — AB (ref 25.1–34.0)
MCHC: 30.8 g/dL — ABNORMAL LOW (ref 31.5–36.0)
MCV: 65.9 fL — ABNORMAL LOW (ref 79.5–101.0)
MONO#: 1.1 10*3/uL — ABNORMAL HIGH (ref 0.1–0.9)
MONO%: 11.7 % (ref 0.0–14.0)
NEUT#: 5.1 10*3/uL (ref 1.5–6.5)
NEUT%: 51.8 % (ref 38.4–76.8)
NRBC: 0 % (ref 0–0)
Platelets: 253 10*3/uL (ref 145–400)
RBC: 4.8 10*6/uL (ref 3.70–5.45)
RDW: 17.9 % — AB (ref 11.2–14.5)
WBC: 9.8 10*3/uL (ref 3.9–10.3)

## 2016-07-03 MED ORDER — DARBEPOETIN ALFA 300 MCG/0.6ML IJ SOSY
300.0000 ug | PREFILLED_SYRINGE | Freq: Once | INTRAMUSCULAR | Status: AC
  Administered 2016-07-03: 300 ug via SUBCUTANEOUS
  Filled 2016-07-03: qty 0.6

## 2016-07-03 NOTE — Patient Instructions (Signed)
Darbepoetin Alfa injection What is this medicine? DARBEPOETIN ALFA (dar be POE e tin AL fa) helps your body make more red blood cells. It is used to treat anemia caused by chronic kidney failure and chemotherapy. This medicine may be used for other purposes; ask your health care provider or pharmacist if you have questions. COMMON BRAND NAME(S): Aranesp What should I tell my health care provider before I take this medicine? They need to know if you have any of these conditions: -blood clotting disorders or history of blood clots -cancer patient not on chemotherapy -cystic fibrosis -heart disease, such as angina, heart failure, or a history of a heart attack -hemoglobin level of 12 g/dL or greater -high blood pressure -low levels of folate, iron, or vitamin B12 -seizures -an unusual or allergic reaction to darbepoetin, erythropoietin, albumin, hamster proteins, latex, other medicines, foods, dyes, or preservatives -pregnant or trying to get pregnant -breast-feeding How should I use this medicine? This medicine is for injection into a vein or under the skin. It is usually given by a health care professional in a hospital or clinic setting. If you get this medicine at home, you will be taught how to prepare and give this medicine. Do not shake the solution before you withdraw a dose. Use exactly as directed. Take your medicine at regular intervals. Do not take your medicine more often than directed. It is important that you put your used needles and syringes in a special sharps container. Do not put them in a trash can. If you do not have a sharps container, call your pharmacist or healthcare provider to get one. Talk to your pediatrician regarding the use of this medicine in children. While this medicine may be used in children as young as 1 year for selected conditions, precautions do apply. Overdosage: If you think you have taken too much of this medicine contact a poison control center or  emergency room at once. NOTE: This medicine is only for you. Do not share this medicine with others. What if I miss a dose? If you miss a dose, take it as soon as you can. If it is almost time for your next dose, take only that dose. Do not take double or extra doses. What may interact with this medicine? Do not take this medicine with any of the following medications: -epoetin alfa This list may not describe all possible interactions. Give your health care provider a list of all the medicines, herbs, non-prescription drugs, or dietary supplements you use. Also tell them if you smoke, drink alcohol, or use illegal drugs. Some items may interact with your medicine. What should I watch for while using this medicine? Visit your prescriber or health care professional for regular checks on your progress and for the needed blood tests and blood pressure measurements. It is especially important for the doctor to make sure your hemoglobin level is in the desired range, to limit the risk of potential side effects and to give you the best benefit. Keep all appointments for any recommended tests. Check your blood pressure as directed. Ask your doctor what your blood pressure should be and when you should contact him or her. As your body makes more red blood cells, you may need to take iron, folic acid, or vitamin B supplements. Ask your doctor or health care provider which products are right for you. If you have kidney disease continue dietary restrictions, even though this medication can make you feel better. Talk with your doctor or health   care professional about the foods you eat and the vitamins that you take. What side effects may I notice from receiving this medicine? Side effects that you should report to your doctor or health care professional as soon as possible: -allergic reactions like skin rash, itching or hives, swelling of the face, lips, or tongue -breathing problems -changes in vision -chest  pain -confusion, trouble speaking or understanding -feeling faint or lightheaded, falls -high blood pressure -muscle aches or pains -pain, swelling, warmth in the leg -rapid weight gain -severe headaches -sudden numbness or weakness of the face, arm or leg -trouble walking, dizziness, loss of balance or coordination -seizures (convulsions) -swelling of the ankles, feet, hands -unusually weak or tired Side effects that usually do not require medical attention (report to your doctor or health care professional if they continue or are bothersome): -diarrhea -fever, chills (flu-like symptoms) -headaches -nausea, vomiting -redness, stinging, or swelling at site where injected This list may not describe all possible side effects. Call your doctor for medical advice about side effects. You may report side effects to FDA at 1-800-FDA-1088. Where should I keep my medicine? Keep out of the reach of children. Store in a refrigerator between 2 and 8 degrees C (36 and 46 degrees F). Do not freeze. Do not shake. Throw away any unused portion if using a single-dose vial. Throw away any unused medicine after the expiration date. NOTE: This sheet is a summary. It may not cover all possible information. If you have questions about this medicine, talk to your doctor, pharmacist, or health care provider.  2015, [employer]. (2008-05-10 10:23:57)  

## 2016-07-04 ENCOUNTER — Encounter (HOSPITAL_COMMUNITY)
Admission: RE | Admit: 2016-07-04 | Discharge: 2016-07-04 | Disposition: A | Discharge status: Home / Self Care | Payer: Medicare Other | Source: Ambulatory Visit | Attending: Rheumatology | Admitting: Rheumatology

## 2016-07-04 DIAGNOSIS — M069 Rheumatoid arthritis, unspecified: Secondary | ICD-10-CM | POA: Diagnosis not present

## 2016-07-04 DIAGNOSIS — N289 Disorder of kidney and ureter, unspecified: Secondary | ICD-10-CM | POA: Diagnosis not present

## 2016-07-04 DIAGNOSIS — D649 Anemia, unspecified: Secondary | ICD-10-CM | POA: Diagnosis present

## 2016-07-04 DIAGNOSIS — D509 Iron deficiency anemia, unspecified: Secondary | ICD-10-CM | POA: Insufficient documentation

## 2016-07-04 MED ORDER — SODIUM CHLORIDE 0.9 % IV SOLN
INTRAVENOUS | Status: AC
  Administered 2016-07-04: 10:00:00 via INTRAVENOUS

## 2016-07-04 MED ORDER — SODIUM CHLORIDE 0.9 % IV SOLN
1000.0000 mg | INTRAVENOUS | Status: DC
  Administered 2016-07-04: 10:00:00 1000 mg via INTRAVENOUS
  Filled 2016-07-04: qty 40

## 2016-07-19 DIAGNOSIS — I10 Essential (primary) hypertension: Secondary | ICD-10-CM | POA: Diagnosis not present

## 2016-07-19 DIAGNOSIS — E7800 Pure hypercholesterolemia, unspecified: Secondary | ICD-10-CM | POA: Diagnosis not present

## 2016-07-26 DIAGNOSIS — E1042 Type 1 diabetes mellitus with diabetic polyneuropathy: Secondary | ICD-10-CM | POA: Diagnosis not present

## 2016-07-26 DIAGNOSIS — I5032 Chronic diastolic (congestive) heart failure: Secondary | ICD-10-CM | POA: Diagnosis not present

## 2016-07-26 DIAGNOSIS — E7800 Pure hypercholesterolemia, unspecified: Secondary | ICD-10-CM | POA: Diagnosis not present

## 2016-07-26 DIAGNOSIS — R600 Localized edema: Secondary | ICD-10-CM | POA: Diagnosis not present

## 2016-07-31 ENCOUNTER — Other Ambulatory Visit (HOSPITAL_COMMUNITY): Payer: Self-pay | Admitting: *Deleted

## 2016-08-01 ENCOUNTER — Encounter (HOSPITAL_COMMUNITY)
Admission: RE | Admit: 2016-08-01 | Discharge: 2016-08-01 | Disposition: A | Discharge status: Home / Self Care | Payer: Medicare Other | Source: Ambulatory Visit | Attending: Rheumatology | Admitting: Rheumatology

## 2016-08-01 DIAGNOSIS — N289 Disorder of kidney and ureter, unspecified: Secondary | ICD-10-CM | POA: Insufficient documentation

## 2016-08-01 DIAGNOSIS — D649 Anemia, unspecified: Secondary | ICD-10-CM | POA: Diagnosis present

## 2016-08-01 DIAGNOSIS — D509 Iron deficiency anemia, unspecified: Secondary | ICD-10-CM | POA: Insufficient documentation

## 2016-08-01 DIAGNOSIS — M069 Rheumatoid arthritis, unspecified: Secondary | ICD-10-CM | POA: Diagnosis not present

## 2016-08-01 MED ORDER — SODIUM CHLORIDE 0.9 % IV SOLN
INTRAVENOUS | Status: DC
  Administered 2016-08-01: 10:00:00 via INTRAVENOUS

## 2016-08-01 MED ORDER — SODIUM CHLORIDE 0.9 % IV SOLN
1000.0000 mg | INTRAVENOUS | Status: DC
  Administered 2016-08-01: 1000 mg via INTRAVENOUS
  Filled 2016-08-01: qty 40

## 2016-08-07 ENCOUNTER — Ambulatory Visit: Payer: Medicare Other

## 2016-08-07 ENCOUNTER — Other Ambulatory Visit (HOSPITAL_BASED_OUTPATIENT_CLINIC_OR_DEPARTMENT_OTHER): Payer: Medicare Other

## 2016-08-07 ENCOUNTER — Telehealth: Payer: Self-pay | Admitting: Oncology

## 2016-08-07 ENCOUNTER — Ambulatory Visit (HOSPITAL_BASED_OUTPATIENT_CLINIC_OR_DEPARTMENT_OTHER): Payer: Medicare Other | Admitting: Oncology

## 2016-08-07 VITALS — BP 161/79 | HR 96 | Temp 98.3°F | Resp 18 | Ht 63.0 in | Wt 283.7 lb

## 2016-08-07 DIAGNOSIS — N289 Disorder of kidney and ureter, unspecified: Secondary | ICD-10-CM

## 2016-08-07 DIAGNOSIS — D509 Iron deficiency anemia, unspecified: Secondary | ICD-10-CM | POA: Diagnosis not present

## 2016-08-07 DIAGNOSIS — D638 Anemia in other chronic diseases classified elsewhere: Secondary | ICD-10-CM

## 2016-08-07 DIAGNOSIS — M069 Rheumatoid arthritis, unspecified: Secondary | ICD-10-CM | POA: Diagnosis not present

## 2016-08-07 DIAGNOSIS — D569 Thalassemia, unspecified: Secondary | ICD-10-CM

## 2016-08-07 DIAGNOSIS — D500 Iron deficiency anemia secondary to blood loss (chronic): Secondary | ICD-10-CM

## 2016-08-07 LAB — CBC WITH DIFFERENTIAL/PLATELET
BASO%: 0.3 % (ref 0.0–2.0)
Basophils Absolute: 0 10*3/uL (ref 0.0–0.1)
EOS ABS: 0.1 10*3/uL (ref 0.0–0.5)
EOS%: 1.7 % (ref 0.0–7.0)
HEMATOCRIT: 33 % — AB (ref 34.8–46.6)
HEMOGLOBIN: 10.3 g/dL — AB (ref 11.6–15.9)
LYMPH#: 3 10*3/uL (ref 0.9–3.3)
LYMPH%: 39.1 % (ref 14.0–49.7)
MCH: 20.7 pg — ABNORMAL LOW (ref 25.1–34.0)
MCHC: 31.2 g/dL — ABNORMAL LOW (ref 31.5–36.0)
MCV: 66.4 fL — ABNORMAL LOW (ref 79.5–101.0)
MONO#: 0.9 10*3/uL (ref 0.1–0.9)
MONO%: 11.2 % (ref 0.0–14.0)
NEUT%: 47.7 % (ref 38.4–76.8)
NEUTROS ABS: 3.7 10*3/uL (ref 1.5–6.5)
NRBC: 0 % (ref 0–0)
PLATELETS: 257 10*3/uL (ref 145–400)
RBC: 4.97 10*6/uL (ref 3.70–5.45)
RDW: 18.4 % — AB (ref 11.2–14.5)
WBC: 7.7 10*3/uL (ref 3.9–10.3)

## 2016-08-07 NOTE — Progress Notes (Signed)
Hematology and Oncology Follow Up Visit  Jessica Howard [id number] 1961-05-08 56 y.o. 08/07/2016 10:05 AM   Principle Diagnosis: 56 year old female with anemia of renal disease. She has also microcytic anemia due to beta thalassemia. She also has element of anemia of chronic disease with history of rheumatoid arthritis.  Current therapy: Aranesp 300 g every 4 weeks to keep her hemoglobin above 10.  Interim History:  Jessica Howard presents today for a follow-up visit. Since her last visit, she reports doing reasonably well without complaints. She denied any recent illnesses or hospitalizations. He denied any hematochezia or melena. She continues to receive Aranesp injections without complications. She feels improvement in her quality of life after receiving it. She denied any thrombosis or bleeding episodes. Her blood pressure have been reasonably controlled. She remains active and and attends activities of daily living. She is intentionally trying to lose weight for better health overall.  She did not report headaches or blurry vision or syncope. She did not report any chest pain or palpitation. She is not reporting nausea, vomiting, abdominal pain. She does not report any hematochezia or bleeding per rectum. Does not report any frequency urgency or hesitancy. She does not report any hematuria hematochezia or melena. Remainder of her review of systems unremarkable.   Medications: I have reviewed the patient's current medications.  Current Outpatient Prescriptions  Medication Sig Dispense Refill  . abatacept (ORENCIA) 250 MG injection Inject 1,000 mg into the vein every 28 (twenty-eight) days. Monthly Infusion    . acetaminophen-codeine (TYLENOL #3) 300-30 MG tablet Take 1 tablet by mouth as directed.    Marland Kitchen albuterol (PROVENTIL HFA;VENTOLIN HFA) 108 (90 Base) MCG/ACT inhaler Inhale 2 puffs into the lungs every 6 (six) hours as needed for wheezing or shortness of breath.    Marland Kitchen amitriptyline  (ELAVIL) 25 MG tablet Take 25 mg by mouth at bedtime.     . ARTIFICIAL TEAR OP Place 1 drop into both eyes as needed (for dry eyes).    . chlorthalidone (HYGROTON) 25 MG tablet Take 25 mg by mouth daily.    . CRESTOR 20 MG tablet Take 20 mg by mouth daily.     Marland Kitchen diltiazem (CARDIZEM CD) 240 MG 24 hr capsule Take 240 mg by mouth daily.     . fluticasone (FLONASE) 50 MCG/ACT nasal spray Place 1 spray into both nostrils daily as needed for allergies or rhinitis.    Marland Kitchen gabapentin (NEURONTIN) 300 MG capsule Take 600 mg by mouth 3 (three) times daily.     . insulin aspart (NOVOLOG) 100 UNIT/ML injection Inject 55-60 Units into the skin 3 (three) times daily before meals. 55 base Plus sliding scale    . insulin NPH (HUMULIN N,NOVOLIN N) 100 UNIT/ML injection Inject 75 Units into the skin 3 (three) times daily with meals. 75 units with breakfast; lunch 75 units; 75 units at dinner    . irbesartan (AVAPRO) 300 MG tablet Take 300 mg by mouth daily.     Marland Kitchen leflunomide (ARAVA) 20 MG tablet Take 20 mg by mouth daily.    . metFORMIN (GLUCOPHAGE-XR) 500 MG 24 hr tablet Take 500 mg by mouth every evening.    . Nebivolol HCl (BYSTOLIC) 20 MG TABS Take 20 mg by mouth daily.     . pantoprazole (PROTONIX) 40 MG tablet Take 40 mg by mouth daily.    . predniSONE (DELTASONE) 5 MG tablet Take 5 mg by mouth daily.    Marland Kitchen tiZANidine (ZANAFLEX) 4 MG tablet Take 4  mg by mouth 2 (two) times daily as needed (migraines). Limit 2 days per week    . topiramate (TOPAMAX) 200 MG tablet Take 200 mg by mouth daily.    . TRULICITY A999333 0000000 SOPN Inject 0.75 mg into the skin once a week. Patient takes on Wednesday.    . VOLTAREN 1 % GEL Apply 1 application topically 3 (three) times daily as needed (for pain).      No current facility-administered medications for this visit.     Allergies:  Allergies  Allergen Reactions  . Bactrim [Sulfamethoxazole-Trimethoprim] Hives  . Cefuroxime Axetil Itching  . Cephalosporins Itching  .  Iohexol Itching and Rash     Code: RASH, Desc: HAD ITCHING AND A RASH ABOUT ONE HOUR AFTER RETURNING HOME FROM THE CT, Onset Date: [id number]   . Lisinopril Cough  . Penicillins Hives  . Sulfa Antibiotics Hives    Past Medical History, Surgical history, Social history, and Family History were reviewed and updated.   Blood pressure (!) 161/79, pulse 96, temperature 98.3 F (36.8 C), temperature source Oral, resp. rate 18, height 5\' 3"  (1.6 m), weight 283 lb 11.2 oz (128.7 kg), last menstrual period 12/27/2013, SpO2 96 %. ECOG: 1 General appearance: Well-appearing woman appeared without distress. Head: Normocephalic, without obvious abnormality. No oral ulcers or lesions. Neck: no adenopathy Lymph nodes: Cervical, supraclavicular, and axillary nodes normal. Heart:regular rate and rhythm, S1, S2 normal, no murmur, click, rub or gallop Lung:chest clear, no wheezing, rales, normal symmetric air entry Abdomin: soft, non-tender, without masses or organomegaly. No shifting dullness or ascites. EXT: Mild edema noted in her lower extremities.    CBC    Component Value Date/Time   WBC 7.7 08/07/2016 0938   WBC 11.9 (H) 01/19/2016 1350   RBC 4.97 08/07/2016 0938   RBC 5.08 01/19/2016 1350   HGB 10.3 (L) 08/07/2016 0938   HCT 33.0 (L) 08/07/2016 0938   PLT 257 08/07/2016 0938   MCV 66.4 (L) 08/07/2016 0938   MCH 20.7 (L) 08/07/2016 0938   MCH 20.1 (L) 01/19/2016 1350   MCHC 31.2 (L) 08/07/2016 0938   MCHC 30.1 01/19/2016 1350   RDW 18.4 (H) 08/07/2016 0938   LYMPHSABS 3.0 08/07/2016 0938   MONOABS 0.9 08/07/2016 0938   EOSABS 0.1 08/07/2016 0938   BASOSABS 0.0 08/07/2016 0938       Impression and Plan:  56 year old female with the following issues:   1. Anemia of renal insufficiency: Her hemoglobin today is 10.3 and we will require Aranesp. She has tolerated this therapy well and the plan is to continue with the monthly monitoring and she will receive Aranesp to boost her  hemoglobin above 10.   Risks and benefits of continuing this treatment were reviewed today and she is agreeable to continue. She does have elements of anemia of chronic disease as well as thalassemia. We will also repeat iron studies to ensure adequate iron stores for Aranesp replacement.  2. 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 Her iron studies in December 2018 were normal and will be repeated in the near future.  3. Rheumatoid arthritis: Likely contributing to chronic anemia related to chronic inflammatory process. She is currently on Orencia which have helped her symptoms.   4. Follow-up: Will be in 4 months to monitor her clinical status.  [facility], MD 2/28/201810:05 AM

## 2016-08-07 NOTE — Telephone Encounter (Signed)
Gave patient AVS and calendar for March - June per 08/07/16 los.

## 2016-08-07 NOTE — Progress Notes (Signed)
Hemoglobin noted at 10.3 today. No injection needed per Protocol order.  Pt given copy of current labs and schedule. Instructed to  Follow schedule and call office if issues occur. Pt verbalized understanding of instructions.

## 2016-08-14 DIAGNOSIS — M150 Primary generalized (osteo)arthritis: Secondary | ICD-10-CM | POA: Diagnosis not present

## 2016-08-14 DIAGNOSIS — D649 Anemia, unspecified: Secondary | ICD-10-CM | POA: Diagnosis not present

## 2016-08-14 DIAGNOSIS — M069 Rheumatoid arthritis, unspecified: Secondary | ICD-10-CM | POA: Diagnosis not present

## 2016-08-14 DIAGNOSIS — Z79899 Other long term (current) drug therapy: Secondary | ICD-10-CM | POA: Diagnosis not present

## 2016-08-22 DIAGNOSIS — Z5181 Encounter for therapeutic drug level monitoring: Secondary | ICD-10-CM | POA: Diagnosis not present

## 2016-08-26 ENCOUNTER — Other Ambulatory Visit: Payer: Self-pay | Admitting: *Deleted

## 2016-08-26 NOTE — Patient Outreach (Addendum)
[facility]) Care Management  08/26/2016   Jessica Howard 09-19-1960 [id number]  RN Health Coach telephone call to patient.  Hipaa compliance verified. Per patient she has been dealing a lot with family crisis. Per patient her fasting blood sugar was 54 today. Her blood sugar range has been 54-308. Per patient she had pasta and cake last night and also ate late. Patient has not started any exercise routine. Per patient her weight is 285 pounds.  Per patient she is taking her medications as per order. Patient has agreed to follow up outreach. Per patient stated she didn't have a book for documentation. RN will send another one.  Current Medications:  Current Outpatient Prescriptions  Medication Sig Dispense Refill  . abatacept (ORENCIA) 250 MG injection Inject 1,000 mg into the vein every 28 (twenty-eight) days. Monthly Infusion    . acetaminophen-codeine (TYLENOL #3) 300-30 MG tablet Take 1 tablet by mouth as directed.    Marland Kitchen albuterol (PROVENTIL HFA;VENTOLIN HFA) 108 (90 Base) MCG/ACT inhaler Inhale 2 puffs into the lungs every 6 (six) hours as needed for wheezing or shortness of breath.    Marland Kitchen amitriptyline (ELAVIL) 25 MG tablet Take 25 mg by mouth at bedtime.     . ARTIFICIAL TEAR OP Place 1 drop into both eyes as needed (for dry eyes).    . chlorthalidone (HYGROTON) 25 MG tablet Take 25 mg by mouth daily.    . CRESTOR 20 MG tablet Take 20 mg by mouth daily.     Marland Kitchen diltiazem (CARDIZEM CD) 240 MG 24 hr capsule Take 240 mg by mouth daily.     . fluticasone (FLONASE) 50 MCG/ACT nasal spray Place 1 spray into both nostrils daily as needed for allergies or rhinitis.    Marland Kitchen gabapentin (NEURONTIN) 300 MG capsule Take 600 mg by mouth 3 (three) times daily.     . insulin aspart (NOVOLOG) 100 UNIT/ML injection Inject 55-60 Units into the skin 3 (three) times daily before meals. 55 base Plus sliding scale    . insulin NPH (HUMULIN N,NOVOLIN N) 100 UNIT/ML injection Inject 75 Units into  the skin 3 (three) times daily with meals. 75 units with breakfast; lunch 75 units; 75 units at dinner    . irbesartan (AVAPRO) 300 MG tablet Take 300 mg by mouth daily.     Marland Kitchen leflunomide (ARAVA) 20 MG tablet Take 20 mg by mouth daily.    . metFORMIN (GLUCOPHAGE-XR) 500 MG 24 hr tablet Take 500 mg by mouth every evening.    . Nebivolol HCl (BYSTOLIC) 20 MG TABS Take 20 mg by mouth daily.     . pantoprazole (PROTONIX) 40 MG tablet Take 40 mg by mouth daily.    . predniSONE (DELTASONE) 5 MG tablet Take 5 mg by mouth daily.    Marland Kitchen tiZANidine (ZANAFLEX) 4 MG tablet Take 4 mg by mouth 2 (two) times daily as needed (migraines). Limit 2 days per week    . topiramate (TOPAMAX) 200 MG tablet Take 200 mg by mouth daily.    . TRULICITY 9.54 YO/3.7CH SOPN Inject 0.75 mg into the skin once a week. Patient takes on Wednesday.    . VOLTAREN 1 % GEL Apply 1 application topically 3 (three) times daily as needed (for pain).      No current facility-administered medications for this visit.     Functional Status:  In your present state of health, do you have any difficulty performing the following activities: 08/26/2016 08/01/2016  Hearing? N N  Vision? N N  Difficulty concentrating or making decisions? N N  Walking or climbing stairs? N N  Dressing or bathing? N N  Doing errands, shopping? N -  Preparing Food and eating ? N -  Using the Toilet? N -  In the past six months, have you accidently leaked urine? N -  Do you have problems with loss of bowel control? N -  Managing your Medications? - -  Managing your Finances? - -  Some recent data might be hidden    Fall/Depression Screening: PHQ 2/9 Scores 08/26/2016 03/29/2016 02/28/2016 04/20/2014 02/16/2014  PHQ - 2 Score 0 0 0 0 0   THN CM Care Plan Problem One     Most Recent Value  Care Plan Problem One  Knowledge Deficit in self management of diabetes  Role Documenting the Problem One  [place] for Problem One  Active  THN Long Term  Goal (31-90 days)  Patient will see a decrease in Hgb A1C within the next 90 days from 10.0  THN Long Term Goal Start Date  08/26/16  Interventions for Problem One Long Term Goal  RN sent patient educational material on A1C. RN discussed with patient what the A1C means abd how the blood sugars affect the A1C.RN discussed what the fasting blood sugar should be to make the A1C below 7   THN CM Short Term Goal #1 (0-30 days)  Patient will report checking into water aerobics within the next 30 days  THN CM Short Term Goal #1 Start Date  08/26/16  Interventions for Short Term Goal #1  RN discussed with patient the importance of weight loss. RN discussd classes available for medicaid and medicare recipients at the [facility] CM Short Term Goal #2 (0-30 days)  Pattient will report her fasting blood sugar is below 200 within the next 30 days  THN CM Short Term Goal #2 Start Date  08/26/16  Interventions for Short Term Goal #2  RN discussed the improtance of fasting blood sugar range and 2 hr pc range. RN will send calendar book for documenting. RN will follow up next outreach call  THN CM Short Term Goal #4 (0-30 days)  Patient will be able to describe  healthy snack within the next 30 days  THN CM Short Term Goal #4 Start Date  08/26/16  Interventions for Short Term Goal #4  RN sent a list of healthy snacks. RN will send a list of diabetic foods from fast food chain. RN will follow up discussion and teach back   THN CM Short Term Goal #5 (0-30 days)  Patient will be able to describe foods low in carbohydrates within the next 30 days  Interventions for Short Term Goal #5  RN sent EMMI educational material Diabetes food and exercise, Counting carbohydrates. RN sent a chart with high and low carbohydrates pictures. RN will follow up with discussion and teach back       Assessment:  Patient has not been compliant with diet Patient has not started an exercise program Patient blood sugar range has been  54-308 Patient will benefit from [facility] telephonic outreach for education and support for diabetes self management.  Plan:  RN discussed the effects of high blood sugars on her body RN discussed the fasting blood sugar range <130 to have an A1C <7 RN will look into water aerobics by next follow up outreach RN will send a calendar book for documenting RN will send living  well with diabetes book RN will send  diabetic food choices  from popular food chains RN will follow up within the month of April  [facility] [place] Management [phone]

## 2016-08-28 ENCOUNTER — Other Ambulatory Visit (HOSPITAL_COMMUNITY): Payer: Self-pay | Admitting: *Deleted

## 2016-08-28 ENCOUNTER — Ambulatory Visit (INDEPENDENT_AMBULATORY_CARE_PROVIDER_SITE_OTHER): Payer: Medicare Other | Admitting: Podiatry

## 2016-08-28 DIAGNOSIS — E1149 Type 2 diabetes mellitus with other diabetic neurological complication: Secondary | ICD-10-CM

## 2016-08-28 DIAGNOSIS — E1140 Type 2 diabetes mellitus with diabetic neuropathy, unspecified: Secondary | ICD-10-CM

## 2016-08-28 DIAGNOSIS — Q828 Other specified congenital malformations of skin: Secondary | ICD-10-CM | POA: Diagnosis not present

## 2016-08-28 DIAGNOSIS — M21619 Bunion of unspecified foot: Secondary | ICD-10-CM

## 2016-08-29 ENCOUNTER — Encounter (HOSPITAL_COMMUNITY)
Admission: RE | Admit: 2016-08-29 | Discharge: 2016-08-29 | Disposition: A | Discharge status: Home / Self Care | Payer: Medicare Other | Source: Ambulatory Visit | Attending: Rheumatology | Admitting: Rheumatology

## 2016-08-29 DIAGNOSIS — N289 Disorder of kidney and ureter, unspecified: Secondary | ICD-10-CM | POA: Insufficient documentation

## 2016-08-29 DIAGNOSIS — D649 Anemia, unspecified: Secondary | ICD-10-CM | POA: Diagnosis present

## 2016-08-29 DIAGNOSIS — M069 Rheumatoid arthritis, unspecified: Secondary | ICD-10-CM | POA: Insufficient documentation

## 2016-08-29 DIAGNOSIS — D509 Iron deficiency anemia, unspecified: Secondary | ICD-10-CM | POA: Diagnosis not present

## 2016-08-29 MED ORDER — ABATACEPT 250 MG IV SOLR
1000.0000 mg | INTRAVENOUS | Status: DC
  Administered 2016-08-29: 10:00:00 1000 mg via INTRAVENOUS
  Filled 2016-08-29: qty 40

## 2016-08-29 MED ORDER — SODIUM CHLORIDE 0.9 % IV SOLN
INTRAVENOUS | Status: DC
  Administered 2016-08-29: 10:00:00 via INTRAVENOUS

## 2016-09-02 NOTE — Progress Notes (Signed)
Subjective:     Patient ID: Jessica Howard, female   DOB: 07-13-1960, 56 y.o.   MRN: [id number]  HPI patient presents with painful lesion plantar aspect right that she cannot take care of long-term diabetes with neurological issues and also bunion deformity which at times is painful   Review of Systems  All other systems reviewed and are negative.      Objective:   Physical Exam  Constitutional: She is oriented to person, place, and time.  Cardiovascular: Intact distal pulses.   Musculoskeletal: Normal range of motion.  Neurological: She is oriented to person, place, and time.  Skin: Skin is warm and dry.  Nursing note and vitals reviewed.  neurovascular status mildly diminished with +1 DP and +1 PT bilateral. Patient's noted to have diminished sharp dull and vibratory bilateral and diminished DTR reflexes. Patient has painful lesion plantar aspect right that she cannot take care of herself and history of nail disease and long-term history of diabetes     Assessment:     Poor health individual with nail disease mycosis and other conditions present with porokeratotic cyst-type lesion right that is risky    Plan:     H&P conditions reviewed and explained. Today debridement accomplished with no iatrogenic bleeding and patient will be seen back for reevaluation

## 2016-09-04 ENCOUNTER — Ambulatory Visit (HOSPITAL_BASED_OUTPATIENT_CLINIC_OR_DEPARTMENT_OTHER): Payer: Medicare Other

## 2016-09-04 ENCOUNTER — Other Ambulatory Visit (HOSPITAL_BASED_OUTPATIENT_CLINIC_OR_DEPARTMENT_OTHER): Payer: Medicare Other

## 2016-09-04 VITALS — BP 150/73 | HR 68 | Temp 97.7°F | Resp 18

## 2016-09-04 DIAGNOSIS — D638 Anemia in other chronic diseases classified elsewhere: Secondary | ICD-10-CM | POA: Diagnosis not present

## 2016-09-04 DIAGNOSIS — M069 Rheumatoid arthritis, unspecified: Secondary | ICD-10-CM

## 2016-09-04 DIAGNOSIS — D500 Iron deficiency anemia secondary to blood loss (chronic): Secondary | ICD-10-CM

## 2016-09-04 DIAGNOSIS — D569 Thalassemia, unspecified: Secondary | ICD-10-CM

## 2016-09-04 DIAGNOSIS — D509 Iron deficiency anemia, unspecified: Secondary | ICD-10-CM | POA: Diagnosis present

## 2016-09-04 DIAGNOSIS — N289 Disorder of kidney and ureter, unspecified: Secondary | ICD-10-CM

## 2016-09-04 LAB — COMPREHENSIVE METABOLIC PANEL
ALBUMIN: 3.5 g/dL (ref 3.5–5.0)
ALK PHOS: 127 U/L (ref 40–150)
ALT: 23 U/L (ref 0–55)
ANION GAP: 8 meq/L (ref 3–11)
AST: 21 U/L (ref 5–34)
BILIRUBIN TOTAL: 0.58 mg/dL (ref 0.20–1.20)
BUN: 10.7 mg/dL (ref 7.0–26.0)
CO2: 21 meq/L — AB (ref 22–29)
CREATININE: 0.8 mg/dL (ref 0.6–1.1)
Calcium: 9.4 mg/dL (ref 8.4–10.4)
Chloride: 113 mEq/L — ABNORMAL HIGH (ref 98–109)
EGFR: >90 mL/min/{1.73_m2} (ref >90)
Glucose: 135 mg/dl (ref 70–140)
Potassium: 3.8 mEq/L (ref 3.5–5.1)
Sodium: 143 mEq/L (ref 136–145)
TOTAL PROTEIN: 6.9 g/dL (ref 6.4–8.3)

## 2016-09-04 LAB — CBC WITH DIFFERENTIAL/PLATELET
BASO%: 0.5 % (ref 0.0–2.0)
BASOS ABS: 0 10*3/uL (ref 0.0–0.1)
EOS ABS: 0.2 10*3/uL (ref 0.0–0.5)
EOS%: 2.3 % (ref 0.0–7.0)
HEMATOCRIT: 29.3 % — AB (ref 34.8–46.6)
HEMOGLOBIN: 9.1 g/dL — AB (ref 11.6–15.9)
LYMPH#: 2.6 10*3/uL (ref 0.9–3.3)
LYMPH%: 38.7 % (ref 14.0–49.7)
MCH: 20.5 pg — ABNORMAL LOW (ref 25.1–34.0)
MCHC: 31.1 g/dL — ABNORMAL LOW (ref 31.5–36.0)
MCV: 66 fL — ABNORMAL LOW (ref 79.5–101.0)
MONO#: 0.8 10*3/uL (ref 0.1–0.9)
MONO%: 11.7 % (ref 0.0–14.0)
NEUT%: 46.8 % (ref 38.4–76.8)
NEUTROS ABS: 3.1 10*3/uL (ref 1.5–6.5)
NRBC: 0 % (ref 0–0)
PLATELETS: 241 10*3/uL (ref 145–400)
RBC: 4.44 10*6/uL (ref 3.70–5.45)
RDW: 18.4 % — AB (ref 11.2–14.5)
WBC: 6.6 10*3/uL (ref 3.9–10.3)

## 2016-09-04 LAB — IRON AND TIBC
%SAT: 38 % (ref 21–57)
Iron: 101 ug/dL (ref 41–142)
TIBC: 269 ug/dL (ref 236–444)
UIBC: 167 ug/dL (ref 120–384)

## 2016-09-04 LAB — FERRITIN: Ferritin: 306 ng/ml — ABNORMAL HIGH (ref 9–269)

## 2016-09-04 MED ORDER — DARBEPOETIN ALFA 300 MCG/0.6ML IJ SOSY
300.0000 ug | PREFILLED_SYRINGE | Freq: Once | INTRAMUSCULAR | Status: AC
  Administered 2016-09-04: 300 ug via SUBCUTANEOUS
  Filled 2016-09-04: qty 0.6

## 2016-09-13 ENCOUNTER — Ambulatory Visit (INDEPENDENT_AMBULATORY_CARE_PROVIDER_SITE_OTHER): Payer: Medicare Other

## 2016-09-13 ENCOUNTER — Ambulatory Visit (HOSPITAL_COMMUNITY)
Admission: EM | Admit: 2016-09-13 | Discharge: 2016-09-13 | Disposition: A | Discharge status: Home / Self Care | Payer: Medicare Other | Attending: Internal Medicine | Admitting: Internal Medicine

## 2016-09-13 ENCOUNTER — Encounter: Payer: Self-pay | Admitting: *Deleted

## 2016-09-13 ENCOUNTER — Encounter (HOSPITAL_COMMUNITY): Payer: Self-pay | Admitting: *Deleted

## 2016-09-13 DIAGNOSIS — L98491 Non-pressure chronic ulcer of skin of other sites limited to breakdown of skin: Secondary | ICD-10-CM

## 2016-09-13 DIAGNOSIS — M7989 Other specified soft tissue disorders: Secondary | ICD-10-CM | POA: Diagnosis not present

## 2016-09-13 NOTE — ED Provider Notes (Signed)
CSN: [id number]     Arrival date & time 09/13/16  1240 History   None    Chief Complaint  Patient presents with  . Nail Problem   (Consider location/radiation/quality/duration/timing/severity/associated sxs/prior Treatment) Patient c/o discoloration to right 4th toe.  She states she noticed a black area on the toe and then it has turned white and is scabbed over.    Toe Pain  This is a new problem. The current episode started more than 1 week ago. The problem occurs constantly. The problem has not changed since onset.Nothing aggravates the symptoms. Nothing relieves the symptoms. She has tried nothing for the symptoms.    Past Medical History:  Diagnosis Date  . Asthma   . Diabetes mellitus    type 2  . Diabetic neuropathy ([place])   . Endometrial polyp   . Fibroadenoma of breast    left  . GERD (gastroesophageal reflux disease)   . Headache    migraines  . Hepatic steatosis   . Hyperlipidemia   . Hypertension   . Impingement syndrome of right shoulder   . Nontraumatic tear of right rotator cuff   . Pneumonia   . Psoriasis   . Rheumatoid arthritis(714.0)    rhematoid and osteoarthritis  . Sleep apnea   . Thyroid disease    had radiation  . Unspecified deficiency anemia    Thalassemia   Past Surgical History:  Procedure Laterality Date  . ANKLE ARTHROSCOPY Right   . CESAREAN SECTION    . COLONOSCOPY    . DILATION AND CURETTAGE OF UTERUS    . EYE SURGERY     surgery on retina  . HAND SURGERY     for arthritis  . left knee surgery nodule removal    . OTHER SURGICAL HISTORY     multiple nodule removal on neck, hands, left knee  . SHOULDER ARTHROSCOPY WITH ROTATOR CUFF REPAIR AND SUBACROMIAL DECOMPRESSION Right 05/20/2014   Procedure: RIGHT SHOULDER ARTHROSCOPY WITH DEBRIDEMENT/DISTAL CLAVICLE EXCISION/ROTATOR CUFF REPAIR AND SUBACROMIAL DECOMPRESSION;  Surgeon: Lorn Junes, MD;  Location: [facility];  Service: Orthopedics;  Laterality: Right;  . TONSILLECTOMY    .  TUBAL LIGATION     bilateral   Family History  Problem Relation Age of Onset  . Diabetes Mother   . Cancer Mother     breast  . Aneurysm Father     brain  . Diabetes Sister   . Cancer Brother     bone marrow cancer   Social History  Substance Use Topics  . Smoking status: Former Smoker    Packs/day: 0.25    Years: 6.00    Types: Cigarettes    Quit date: 06/09/2013  . Smokeless tobacco: Never Used  . Alcohol use No   OB History    Gravida Para Term Preterm AB Living   3 2 2  0 1 2   SAB TAB Ectopic Multiple Live Births   1 0 0 0       Review of Systems  Constitutional: Negative.   HENT: Negative.   Eyes: Negative.   Respiratory: Negative.   Cardiovascular: Negative.   Gastrointestinal: Negative.   Endocrine: Negative.   Genitourinary: Negative.   Musculoskeletal: Positive for arthralgias.  Allergic/Immunologic: Negative.   Neurological: Negative.   Hematological: Negative.   Psychiatric/Behavioral: Negative.     Allergies  Bactrim [sulfamethoxazole-trimethoprim]; Cefuroxime axetil; Cephalosporins; Iohexol; Lisinopril; Penicillins; and Sulfa antibiotics  Home Medications   Prior to Admission medications   Medication Sig Start Date  End Date Taking? Authorizing Provider  abatacept (ORENCIA) 250 MG injection Inject 1,000 mg into the vein every 28 (twenty-eight) days. Monthly Infusion    Historical Provider, MD  acetaminophen-codeine (TYLENOL #3) 300-30 MG tablet Take 1 tablet by mouth as directed. 07/26/16   Historical Provider, MD  albuterol (PROVENTIL HFA;VENTOLIN HFA) 108 (90 Base) MCG/ACT inhaler Inhale 2 puffs into the lungs every 6 (six) hours as needed for wheezing or shortness of breath.    Historical Provider, MD  amitriptyline (ELAVIL) 25 MG tablet Take 25 mg by mouth at bedtime.     Historical Provider, MD  ARTIFICIAL TEAR OP Place 1 drop into both eyes as needed (for dry eyes).    Historical Provider, MD  chlorthalidone (HYGROTON) 25 MG tablet Take 25  mg by mouth daily.    Historical Provider, MD  CRESTOR 20 MG tablet Take 20 mg by mouth daily.  03/14/14   Historical Provider, MD  diltiazem (CARDIZEM CD) 240 MG 24 hr capsule Take 240 mg by mouth daily.  02/03/14   Historical Provider, MD  fluticasone (FLONASE) 50 MCG/ACT nasal spray Place 1 spray into both nostrils daily as needed for allergies or rhinitis.    Historical Provider, MD  gabapentin (NEURONTIN) 300 MG capsule Take 600 mg by mouth 3 (three) times daily.     Historical Provider, MD  insulin aspart (NOVOLOG) 100 UNIT/ML injection Inject 55-60 Units into the skin 3 (three) times daily before meals. 55 base Plus sliding scale    Historical Provider, MD  insulin NPH (HUMULIN N,NOVOLIN N) 100 UNIT/ML injection Inject 75 Units into the skin 3 (three) times daily with meals. 75 units with breakfast; lunch 75 units; 75 units at dinner    Historical Provider, MD  irbesartan (AVAPRO) 300 MG tablet Take 300 mg by mouth daily.     Historical Provider, MD  leflunomide (ARAVA) 20 MG tablet Take 20 mg by mouth daily.    Historical Provider, MD  metFORMIN (GLUCOPHAGE-XR) 500 MG 24 hr tablet Take 500 mg by mouth every evening.    Historical Provider, MD  Nebivolol HCl (BYSTOLIC) 20 MG TABS Take 20 mg by mouth daily.     Historical Provider, MD  pantoprazole (PROTONIX) 40 MG tablet Take 40 mg by mouth daily.    Historical Provider, MD  predniSONE (DELTASONE) 5 MG tablet Take 5 mg by mouth daily.    Historical Provider, MD  tiZANidine (ZANAFLEX) 4 MG tablet Take 4 mg by mouth 2 (two) times daily as needed (migraines). Limit 2 days per week 01/12/14   Historical Provider, MD  topiramate (TOPAMAX) 200 MG tablet Take 200 mg by mouth daily. 07/20/16   Historical Provider, MD  TRULICITY 8.75 IE/3.3IR SOPN Inject 0.75 mg into the skin once a week. Patient takes on Wednesday. 08/29/15   Historical Provider, MD  VOLTAREN 1 % GEL Apply 1 application topically 3 (three) times daily as needed (for pain).  02/03/14    Historical Provider, MD   Meds Ordered and Administered this Visit  Medications - No data to display  LMP 12/27/2013 Comment: irreggular No data found.   Physical Exam  Constitutional: She is oriented to person, place, and time. She appears well-developed and well-nourished.  HENT:  Head: Normocephalic.  Right Ear: External ear normal.  Left Ear: External ear normal.  Mouth/Throat: Oropharynx is clear and moist.  Eyes: Conjunctivae and EOM are normal. Pupils are equal, round, and reactive to light.  Neck: Normal range of motion. Neck supple.  Cardiovascular:  Normal rate, regular rhythm and normal heart sounds.   Pulmonary/Chest: Effort normal and breath sounds normal.  Abdominal: Soft. Bowel sounds are normal.  Musculoskeletal:  Right 4th toe with callous and scabbing.  No tenderness.  Neurological: She is alert and oriented to person, place, and time.  Nursing note and vitals reviewed.   Urgent Care Course     Procedures (including critical care time)  Labs Review Labs Reviewed - No data to display  Imaging Review Dg Foot Complete Right  Result Date: 09/13/2016 CLINICAL DATA:  Ingrown toenail.  Pain fourth digit. EXAM: RIGHT FOOT COMPLETE - 3+ VIEW COMPARISON:  05/27/2015. FINDINGS: Diffuse soft swelling. Deformity noted of the distal interphalangeal joint about the right great toe. Callus formation noted. Corticated erosions are present. Corticated bony fragments are noted. These findings most likely represent those of healing of the acute process noted in this region on prior study of 07/27/2014 . Diffuse degenerative change and osteopenia. No acute bony or joint abnormality identified. No acute erosive abnormality. Fourth digit is unremarkable. IMPRESSION: 1. No acute abnormality. Specifically the fourth digit is unremarkable. 2. Deformity noted of the distal phalanx of the right great toe, most likely secondary to old fracture with healing. Diffuse osteopenia and  degenerative change. Diffuse soft tissue swelling . Electronically Signed   By: Marcello Moores  Register   On: 09/13/2016 13:24     Visual Acuity Review  Right Eye Distance:   Left Eye Distance:   Bilateral Distance:    Right Eye Near:   Left Eye Near:    Bilateral Near:         MDM   1. Callous ulcer, limited to breakdown of skin [facility])    Reassured patient and recommend she follow up with [place], FNP 09/13/16 1355

## 2016-09-13 NOTE — ED Triage Notes (Signed)
Patient reports noticed discoloration to toe nail a couple weeks ago, toe turned black, and then today she noticed that her toe is peeling. No pain. No redness. Right foot, 2nd toe.

## 2016-09-13 NOTE — Discharge Instructions (Signed)
Follow up with Podiatrist for callous right 2nd toe

## 2016-09-16 DIAGNOSIS — M150 Primary generalized (osteo)arthritis: Secondary | ICD-10-CM | POA: Diagnosis not present

## 2016-09-16 DIAGNOSIS — Z79899 Other long term (current) drug therapy: Secondary | ICD-10-CM | POA: Diagnosis not present

## 2016-09-16 DIAGNOSIS — D649 Anemia, unspecified: Secondary | ICD-10-CM | POA: Diagnosis not present

## 2016-09-16 DIAGNOSIS — M069 Rheumatoid arthritis, unspecified: Secondary | ICD-10-CM | POA: Diagnosis not present

## 2016-09-23 DIAGNOSIS — L709 Acne, unspecified: Secondary | ICD-10-CM | POA: Diagnosis not present

## 2016-09-23 DIAGNOSIS — E109 Type 1 diabetes mellitus without complications: Secondary | ICD-10-CM | POA: Diagnosis not present

## 2016-09-23 DIAGNOSIS — R809 Proteinuria, unspecified: Secondary | ICD-10-CM | POA: Diagnosis not present

## 2016-09-23 DIAGNOSIS — L739 Follicular disorder, unspecified: Secondary | ICD-10-CM | POA: Diagnosis not present

## 2016-09-23 DIAGNOSIS — I10 Essential (primary) hypertension: Secondary | ICD-10-CM | POA: Diagnosis not present

## 2016-09-23 DIAGNOSIS — E7800 Pure hypercholesterolemia, unspecified: Secondary | ICD-10-CM | POA: Diagnosis not present

## 2016-09-25 DIAGNOSIS — E119 Type 2 diabetes mellitus without complications: Secondary | ICD-10-CM | POA: Diagnosis not present

## 2016-09-25 DIAGNOSIS — I5032 Chronic diastolic (congestive) heart failure: Secondary | ICD-10-CM | POA: Diagnosis not present

## 2016-09-25 DIAGNOSIS — E7800 Pure hypercholesterolemia, unspecified: Secondary | ICD-10-CM | POA: Diagnosis not present

## 2016-09-25 DIAGNOSIS — M545 Low back pain: Secondary | ICD-10-CM | POA: Diagnosis not present

## 2016-09-26 ENCOUNTER — Inpatient Hospital Stay (HOSPITAL_COMMUNITY): Admission: RE | Admit: 2016-09-26 | Payer: Medicare Other | Source: Ambulatory Visit

## 2016-09-26 ENCOUNTER — Other Ambulatory Visit: Payer: Self-pay | Admitting: *Deleted
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
## 2016-10-01 ENCOUNTER — Ambulatory Visit (INDEPENDENT_AMBULATORY_CARE_PROVIDER_SITE_OTHER): Payer: Medicare Other | Admitting: Ophthalmology

## 2016-10-01 DIAGNOSIS — I10 Essential (primary) hypertension: Secondary | ICD-10-CM | POA: Diagnosis not present

## 2016-10-01 DIAGNOSIS — E11319 Type 2 diabetes mellitus with unspecified diabetic retinopathy without macular edema: Secondary | ICD-10-CM | POA: Diagnosis not present

## 2016-10-01 DIAGNOSIS — H35033 Hypertensive retinopathy, bilateral: Secondary | ICD-10-CM

## 2016-10-01 DIAGNOSIS — H43813 Vitreous degeneration, bilateral: Secondary | ICD-10-CM | POA: Diagnosis not present

## 2016-10-01 DIAGNOSIS — E113393 Type 2 diabetes mellitus with moderate nonproliferative diabetic retinopathy without macular edema, bilateral: Secondary | ICD-10-CM

## 2016-10-02 ENCOUNTER — Ambulatory Visit: Payer: Medicare Other

## 2016-10-02 ENCOUNTER — Other Ambulatory Visit (HOSPITAL_BASED_OUTPATIENT_CLINIC_OR_DEPARTMENT_OTHER): Payer: Medicare Other

## 2016-10-02 DIAGNOSIS — D569 Thalassemia, unspecified: Secondary | ICD-10-CM

## 2016-10-02 DIAGNOSIS — M069 Rheumatoid arthritis, unspecified: Secondary | ICD-10-CM

## 2016-10-02 DIAGNOSIS — D509 Iron deficiency anemia, unspecified: Secondary | ICD-10-CM

## 2016-10-02 LAB — CBC WITH DIFFERENTIAL/PLATELET
BASO%: 0.5 % (ref 0.0–2.0)
Basophils Absolute: 0 10*3/uL (ref 0.0–0.1)
EOS ABS: 0.1 10*3/uL (ref 0.0–0.5)
EOS%: 1.4 % (ref 0.0–7.0)
HEMATOCRIT: 33.2 % — AB (ref 34.8–46.6)
HEMOGLOBIN: 10.2 g/dL — AB (ref 11.6–15.9)
LYMPH#: 3.1 10*3/uL (ref 0.9–3.3)
LYMPH%: 37.5 % (ref 14.0–49.7)
MCH: 20.6 pg — ABNORMAL LOW (ref 25.1–34.0)
MCHC: 30.7 g/dL — ABNORMAL LOW (ref 31.5–36.0)
MCV: 67.1 fL — ABNORMAL LOW (ref 79.5–101.0)
MONO#: 1 10*3/uL — ABNORMAL HIGH (ref 0.1–0.9)
MONO%: 12.3 % (ref 0.0–14.0)
NEUT#: 4 10*3/uL (ref 1.5–6.5)
NEUT%: 48.3 % (ref 38.4–76.8)
NRBC: 0 % (ref 0–0)
PLATELETS: 247 10*3/uL (ref 145–400)
RBC: 4.95 10*6/uL (ref 3.70–5.45)
RDW: 17.8 % — ABNORMAL HIGH (ref 11.2–14.5)
WBC: 8.3 10*3/uL (ref 3.9–10.3)

## 2016-10-02 NOTE — Progress Notes (Signed)
Hemoglobin noted at 10.2 today. No injection needed per protocol order. Pt given copy of labs and current schedule.  Instructed to follow schedule and call office should issues occur.

## 2016-10-07 ENCOUNTER — Encounter (HOSPITAL_COMMUNITY)
Admission: RE | Admit: 2016-10-07 | Discharge: 2016-10-07 | Disposition: A | Discharge status: Home / Self Care | Payer: Medicare Other | Source: Ambulatory Visit | Attending: Rheumatology | Admitting: Rheumatology

## 2016-10-07 DIAGNOSIS — D509 Iron deficiency anemia, unspecified: Secondary | ICD-10-CM | POA: Diagnosis not present

## 2016-10-07 DIAGNOSIS — N289 Disorder of kidney and ureter, unspecified: Secondary | ICD-10-CM | POA: Insufficient documentation

## 2016-10-07 DIAGNOSIS — M069 Rheumatoid arthritis, unspecified: Secondary | ICD-10-CM | POA: Insufficient documentation

## 2016-10-07 DIAGNOSIS — D649 Anemia, unspecified: Secondary | ICD-10-CM | POA: Diagnosis present

## 2016-10-07 MED ORDER — SODIUM CHLORIDE 0.9 % IV SOLN
1000.0000 mg | INTRAVENOUS | Status: DC
  Administered 2016-10-07: 1000 mg via INTRAVENOUS
  Filled 2016-10-07: qty 40

## 2016-10-07 MED ORDER — SODIUM CHLORIDE 0.9 % IV SOLN
INTRAVENOUS | Status: DC
  Administered 2016-10-07: 10:00:00 via INTRAVENOUS

## 2016-10-08 DIAGNOSIS — M129 Arthropathy, unspecified: Secondary | ICD-10-CM | POA: Diagnosis not present

## 2016-10-08 DIAGNOSIS — M5441 Lumbago with sciatica, right side: Secondary | ICD-10-CM | POA: Diagnosis not present

## 2016-10-08 DIAGNOSIS — Z794 Long term (current) use of insulin: Secondary | ICD-10-CM | POA: Diagnosis not present

## 2016-10-08 DIAGNOSIS — Z79899 Other long term (current) drug therapy: Secondary | ICD-10-CM | POA: Diagnosis not present

## 2016-10-08 DIAGNOSIS — G8929 Other chronic pain: Secondary | ICD-10-CM | POA: Diagnosis not present

## 2016-10-08 DIAGNOSIS — E119 Type 2 diabetes mellitus without complications: Secondary | ICD-10-CM | POA: Diagnosis not present

## 2016-10-14 ENCOUNTER — Other Ambulatory Visit: Payer: Self-pay | Admitting: *Deleted

## 2016-10-14 NOTE — Patient Outreach (Signed)
[facility]) Care Management  10/14/2016   Jessica Howard 1960-08-21 [id number]  RN Health Coach received return telephone call from patient.  Hipaa compliance verified. Per patient she has come back to [place] from [place]. Pt stated that she went to her Diabetes physician on [phone]. Per patient her A1C is 7.2. This has decreased from 8.6. Patient fasting blood sugar this am is 106.  Per patient her blood sugar range has been from 46-249.  She has 3 episodes within the past month of low blood sugars. RN discussed eating snacks and times that she is eating her dinner..Per patient she went to try to get in to the silver sneakers and was told that her plan will not cover this. Patient is not in any exercise program. Her weight is 286 and she is 5 ft 2 inches Patient has agreed to follow up outreach calls. .   Objective:   Current Medications:  Current Outpatient Prescriptions  Medication Sig Dispense Refill  . abatacept (ORENCIA) 250 MG injection Inject 1,000 mg into the vein every 28 (twenty-eight) days. Monthly Infusion    . acetaminophen-codeine (TYLENOL #3) 300-30 MG tablet Take 1 tablet by mouth as directed.    Marland Kitchen albuterol (PROVENTIL HFA;VENTOLIN HFA) 108 (90 Base) MCG/ACT inhaler Inhale 2 puffs into the lungs every 6 (six) hours as needed for wheezing or shortness of breath.    Marland Kitchen amitriptyline (ELAVIL) 25 MG tablet Take 25 mg by mouth at bedtime.     . ARTIFICIAL TEAR OP Place 1 drop into both eyes as needed (for dry eyes).    . chlorthalidone (HYGROTON) 25 MG tablet Take 25 mg by mouth daily.    . CRESTOR 20 MG tablet Take 20 mg by mouth daily.     Marland Kitchen diltiazem (CARDIZEM CD) 240 MG 24 hr capsule Take 240 mg by mouth daily.     . fluticasone (FLONASE) 50 MCG/ACT nasal spray Place 1 spray into both nostrils daily as needed for allergies or rhinitis.    Marland Kitchen gabapentin (NEURONTIN) 300 MG capsule Take 600 mg by mouth 3 (three) times daily.     . insulin aspart (NOVOLOG)  100 UNIT/ML injection Inject 55-60 Units into the skin 3 (three) times daily before meals. 55 base Plus sliding scale    . insulin NPH (HUMULIN N,NOVOLIN N) 100 UNIT/ML injection Inject 75 Units into the skin 3 (three) times daily with meals. 75 units with breakfast; lunch 75 units; 75 units at dinner    . irbesartan (AVAPRO) 300 MG tablet Take 300 mg by mouth daily.     Marland Kitchen leflunomide (ARAVA) 20 MG tablet Take 20 mg by mouth daily.    . metFORMIN (GLUCOPHAGE-XR) 500 MG 24 hr tablet Take 500 mg by mouth every evening.    . Nebivolol HCl (BYSTOLIC) 20 MG TABS Take 20 mg by mouth daily.     . pantoprazole (PROTONIX) 40 MG tablet Take 40 mg by mouth daily.    . predniSONE (DELTASONE) 5 MG tablet Take 5 mg by mouth daily.    Marland Kitchen tiZANidine (ZANAFLEX) 4 MG tablet Take 4 mg by mouth 2 (two) times daily as needed (migraines). Limit 2 days per week    . topiramate (TOPAMAX) 200 MG tablet Take 200 mg by mouth daily.    . TRULICITY 5.27 PO/2.4MP SOPN Inject 0.75 mg into the skin once a week. Patient takes on Wednesday.    . VOLTAREN 1 % GEL Apply 1 application topically 3 (three) times daily  as needed (for pain).      No current facility-administered medications for this visit.     Functional Status:  In your present state of health, do you have any difficulty performing the following activities: 10/14/2016 08/29/2016  Hearing? N N  Vision? N N  Difficulty concentrating or making decisions? N N  Walking or climbing stairs? N N  Dressing or bathing? N N  Doing errands, shopping? N -  Preparing Food and eating ? N -  Using the Toilet? N -  In the past six months, have you accidently leaked urine? N -  Do you have problems with loss of bowel control? N -  Managing your Medications? N -  Managing your Finances? N -  Housekeeping or managing your Housekeeping? N -  Some recent data might be hidden    Fall/Depression Screening: Fall Risk  10/14/2016 08/26/2016 03/29/2016  Falls in the past year? No No No   Number falls in past yr: - - -   PHQ 2/9 Scores 10/14/2016 08/26/2016 03/29/2016 02/28/2016 04/20/2014 02/16/2014  PHQ - 2 Score 0 0 0 0 0 0   THN CM Care Plan Problem One     Most Recent Value  Care Plan Problem One  Knowledge Deficit in self management of diabetes  Role Documenting the Problem One  [place] for Problem One  Active  THN Long Term Goal (31-90 days)  Patient will see a decrease in Hgb A1C within the next 90 days from 10.0  THN Long Term Goal Start Date  10/14/16  Interventions for Problem One Long Term Goal  RN sent patient educational material on A1C. RN discussed with patient what the A1C means abd how the blood sugars affect the A1C.RN discussed what the fasting blood sugar should be to make the A1C below 7   THN CM Short Term Goal #1 (0-30 days)  Patient will report checking into water aerobics within the next 30 days  THN CM Short Term Goal #1 Start Date  10/14/16  Interventions for Short Term Goal #1  RN discussed with patient the importance of weight loss. RN discussd classes available for medicaid and medicare recipients at the [facility] CM Short Term Goal #2 (0-30 days)  Pattient will report her fasting blood sugar is below 200 within the next 30 days  THN CM Short Term Goal #2 Start Date  10/14/16  Interventions for Short Term Goal #2  RN discussed the improtance of fasting blood sugar range and 2 hr pc range. RN will send calendar book for documenting. RN will follow up next outreach call  THN CM Short Term Goal #4 (0-30 days)  Patient will be able to describe  healthy snack within the next 30 days  THN CM Short Term Goal #4 Start Date  10/14/16  Interventions for Short Term Goal #4  RN sent a list of healthy snacks. RN will send a list of diabetic foods from fast food chain. RN will follow up discussion and teach back   THN CM Short Term Goal #5 (0-30 days)  Patient will be able to describe foods low in carbohydrates within the next 30 days  THN CM Short  Term Goal #5 Start Date  10/14/16  Interventions for Short Term Goal #5  RN sent EMMI educational material Diabetes food and exercise, Counting carbohydrates. RN sent a chart with high and low carbohydrates pictures. RN will follow up with discussion and teach back  Assessment:  A1C is 7.2 down from 8.6 Patient fasting blood sugar is 106 this am Patient has not gotten into a routine exercise program Patient will continue to benefit from [place] telephonic outreach for education and support for diabetes self management.  Plan:  RN Health Coach contacted [place] representative to see if information given was correct and options RN discussed eating habits and appropiate foods RN discussed blood sugar ranges in comparison with A1C RN will follow up outreach within the month of May  Osias Resnick [place] Management [phone]

## 2016-10-16 ENCOUNTER — Telehealth: Payer: Self-pay

## 2016-10-16 NOTE — Telephone Encounter (Signed)
Left a VM for patient in regards to the PREP that [facility] CM has referred her in to.

## 2016-10-17 DIAGNOSIS — L309 Dermatitis, unspecified: Secondary | ICD-10-CM | POA: Diagnosis not present

## 2016-10-18 DIAGNOSIS — M150 Primary generalized (osteo)arthritis: Secondary | ICD-10-CM | POA: Diagnosis not present

## 2016-10-18 DIAGNOSIS — Z79899 Other long term (current) drug therapy: Secondary | ICD-10-CM | POA: Diagnosis not present

## 2016-10-18 DIAGNOSIS — M545 Low back pain: Secondary | ICD-10-CM | POA: Diagnosis not present

## 2016-10-18 DIAGNOSIS — M069 Rheumatoid arthritis, unspecified: Secondary | ICD-10-CM | POA: Diagnosis not present

## 2016-10-21 DIAGNOSIS — H18832 Recurrent erosion of cornea, left eye: Secondary | ICD-10-CM | POA: Diagnosis not present

## 2016-10-22 ENCOUNTER — Encounter: Payer: Self-pay | Admitting: Podiatry

## 2016-10-22 ENCOUNTER — Ambulatory Visit (INDEPENDENT_AMBULATORY_CARE_PROVIDER_SITE_OTHER): Payer: Medicare Other | Admitting: Podiatry

## 2016-10-22 VITALS — BP 143/70 | HR 71

## 2016-10-22 DIAGNOSIS — E1140 Type 2 diabetes mellitus with diabetic neuropathy, unspecified: Secondary | ICD-10-CM

## 2016-10-22 DIAGNOSIS — M21619 Bunion of unspecified foot: Secondary | ICD-10-CM | POA: Diagnosis not present

## 2016-10-22 DIAGNOSIS — M2141 Flat foot [pes planus] (acquired), right foot: Secondary | ICD-10-CM | POA: Diagnosis not present

## 2016-10-22 DIAGNOSIS — M2142 Flat foot [pes planus] (acquired), left foot: Secondary | ICD-10-CM

## 2016-10-22 DIAGNOSIS — E1149 Type 2 diabetes mellitus with other diabetic neurological complication: Secondary | ICD-10-CM | POA: Diagnosis not present

## 2016-10-22 DIAGNOSIS — G8929 Other chronic pain: Secondary | ICD-10-CM | POA: Diagnosis not present

## 2016-10-22 DIAGNOSIS — M5441 Lumbago with sciatica, right side: Secondary | ICD-10-CM | POA: Diagnosis not present

## 2016-10-22 DIAGNOSIS — Z794 Long term (current) use of insulin: Secondary | ICD-10-CM | POA: Diagnosis not present

## 2016-10-22 DIAGNOSIS — E119 Type 2 diabetes mellitus without complications: Secondary | ICD-10-CM | POA: Diagnosis not present

## 2016-10-22 DIAGNOSIS — Z79899 Other long term (current) drug therapy: Secondary | ICD-10-CM | POA: Diagnosis not present

## 2016-10-22 NOTE — Progress Notes (Signed)
This patient presents to the office for an evaluation of her diabetic feet as well as diabetic shoes.  This patient has diabetic neuropathy for which she is taking gabapentin. She also is taking insulin for her diabetes.  Objective;  dorsalis pedis and posterior tibial pulses are weak, equal, regular bilaterally. Her LOPS  was found to be within normal limits.  Examination of her muscular system reveals pes planus bilaterally. She also has DJD of the first MPJ bilaterally  Assessment  Diabetes neuropathy.  DJD 1st MPJ  B/L  Return office visit. This patient's feet were examined and she does qualify for diabetic shoes based on the fact that she has diabetic peripheral neuropathy was pes planus and arthritis of the big toe joint. She was brought over to see where x-rays and to be evaluated for diabetic shoes at this time RTC prn for [facility].   Gardiner Barefoot DPM.

## 2016-10-29 DIAGNOSIS — H1851 Endothelial corneal dystrophy: Secondary | ICD-10-CM | POA: Diagnosis not present

## 2016-10-29 DIAGNOSIS — L309 Dermatitis, unspecified: Secondary | ICD-10-CM | POA: Diagnosis not present

## 2016-10-29 DIAGNOSIS — H1859 Other hereditary corneal dystrophies: Secondary | ICD-10-CM | POA: Diagnosis not present

## 2016-10-30 ENCOUNTER — Other Ambulatory Visit (HOSPITAL_BASED_OUTPATIENT_CLINIC_OR_DEPARTMENT_OTHER): Payer: Medicare Other

## 2016-10-30 ENCOUNTER — Ambulatory Visit (HOSPITAL_BASED_OUTPATIENT_CLINIC_OR_DEPARTMENT_OTHER): Payer: Medicare Other

## 2016-10-30 VITALS — BP 129/81 | HR 83 | Temp 97.0°F | Resp 18

## 2016-10-30 DIAGNOSIS — D638 Anemia in other chronic diseases classified elsewhere: Secondary | ICD-10-CM | POA: Diagnosis not present

## 2016-10-30 DIAGNOSIS — M069 Rheumatoid arthritis, unspecified: Secondary | ICD-10-CM | POA: Diagnosis present

## 2016-10-30 DIAGNOSIS — D509 Iron deficiency anemia, unspecified: Secondary | ICD-10-CM

## 2016-10-30 DIAGNOSIS — D569 Thalassemia, unspecified: Secondary | ICD-10-CM

## 2016-10-30 DIAGNOSIS — N289 Disorder of kidney and ureter, unspecified: Secondary | ICD-10-CM

## 2016-10-30 LAB — CBC WITH DIFFERENTIAL/PLATELET
BASO%: 0.5 % (ref 0.0–2.0)
Basophils Absolute: 0 10*3/uL (ref 0.0–0.1)
EOS%: 2.1 % (ref 0.0–7.0)
Eosinophils Absolute: 0.1 10*3/uL (ref 0.0–0.5)
HCT: 31.9 % — ABNORMAL LOW (ref 34.8–46.6)
HEMOGLOBIN: 9.9 g/dL — AB (ref 11.6–15.9)
LYMPH#: 2.6 10*3/uL (ref 0.9–3.3)
LYMPH%: 44.6 % (ref 14.0–49.7)
MCH: 20.4 pg — AB (ref 25.1–34.0)
MCHC: 30.9 g/dL — ABNORMAL LOW (ref 31.5–36.0)
MCV: 66.2 fL — ABNORMAL LOW (ref 79.5–101.0)
MONO#: 0.8 10*3/uL (ref 0.1–0.9)
MONO%: 14.2 % — AB (ref 0.0–14.0)
NEUT#: 2.2 10*3/uL (ref 1.5–6.5)
NEUT%: 38.6 % (ref 38.4–76.8)
Platelets: 247 10*3/uL (ref 145–400)
RBC: 4.83 10*6/uL (ref 3.70–5.45)
RDW: 17.4 % — AB (ref 11.2–14.5)
WBC: 5.8 10*3/uL (ref 3.9–10.3)

## 2016-10-30 MED ORDER — DARBEPOETIN ALFA 300 MCG/0.6ML IJ SOSY
300.0000 ug | PREFILLED_SYRINGE | Freq: Once | INTRAMUSCULAR | Status: AC
  Administered 2016-10-30: 300 ug via SUBCUTANEOUS
  Filled 2016-10-30: qty 0.6

## 2016-10-30 NOTE — Patient Instructions (Signed)
Darbepoetin Alfa injection What is this medicine? DARBEPOETIN ALFA (dar be POE e tin AL fa) helps your body make more red blood cells. It is used to treat anemia caused by chronic kidney failure and chemotherapy. This medicine may be used for other purposes; ask your health care provider or pharmacist if you have questions. COMMON BRAND NAME(S): Aranesp What should I tell my health care provider before I take this medicine? They need to know if you have any of these conditions: -blood clotting disorders or history of blood clots -cancer patient not on chemotherapy -cystic fibrosis -heart disease, such as angina, heart failure, or a history of a heart attack -hemoglobin level of 12 g/dL or greater -high blood pressure -low levels of folate, iron, or vitamin B12 -seizures -an unusual or allergic reaction to darbepoetin, erythropoietin, albumin, hamster proteins, latex, other medicines, foods, dyes, or preservatives -pregnant or trying to get pregnant -breast-feeding How should I use this medicine? This medicine is for injection into a vein or under the skin. It is usually given by a health care professional in a hospital or clinic setting. If you get this medicine at home, you will be taught how to prepare and give this medicine. Do not shake the solution before you withdraw a dose. Use exactly as directed. Take your medicine at regular intervals. Do not take your medicine more often than directed. It is important that you put your used needles and syringes in a special sharps container. Do not put them in a trash can. If you do not have a sharps container, call your pharmacist or healthcare provider to get one. Talk to your pediatrician regarding the use of this medicine in children. While this medicine may be used in children as young as 1 year for selected conditions, precautions do apply. Overdosage: If you think you have taken too much of this medicine contact a poison control center or  emergency room at once. NOTE: This medicine is only for you. Do not share this medicine with others. What if I miss a dose? If you miss a dose, take it as soon as you can. If it is almost time for your next dose, take only that dose. Do not take double or extra doses. What may interact with this medicine? Do not take this medicine with any of the following medications: -epoetin alfa This list may not describe all possible interactions. Give your health care provider a list of all the medicines, herbs, non-prescription drugs, or dietary supplements you use. Also tell them if you smoke, drink alcohol, or use illegal drugs. Some items may interact with your medicine. What should I watch for while using this medicine? Visit your prescriber or health care professional for regular checks on your progress and for the needed blood tests and blood pressure measurements. It is especially important for the doctor to make sure your hemoglobin level is in the desired range, to limit the risk of potential side effects and to give you the best benefit. Keep all appointments for any recommended tests. Check your blood pressure as directed. Ask your doctor what your blood pressure should be and when you should contact him or her. As your body makes more red blood cells, you may need to take iron, folic acid, or vitamin B supplements. Ask your doctor or health care provider which products are right for you. If you have kidney disease continue dietary restrictions, even though this medication can make you feel better. Talk with your doctor or health   care professional about the foods you eat and the vitamins that you take. What side effects may I notice from receiving this medicine? Side effects that you should report to your doctor or health care professional as soon as possible: -allergic reactions like skin rash, itching or hives, swelling of the face, lips, or tongue -breathing problems -changes in vision -chest  pain -confusion, trouble speaking or understanding -feeling faint or lightheaded, falls -high blood pressure -muscle aches or pains -pain, swelling, warmth in the leg -rapid weight gain -severe headaches -sudden numbness or weakness of the face, arm or leg -trouble walking, dizziness, loss of balance or coordination -seizures (convulsions) -swelling of the ankles, feet, hands -unusually weak or tired Side effects that usually do not require medical attention (report to your doctor or health care professional if they continue or are bothersome): -diarrhea -fever, chills (flu-like symptoms) -headaches -nausea, vomiting -redness, stinging, or swelling at site where injected This list may not describe all possible side effects. Call your doctor for medical advice about side effects. You may report side effects to FDA at 1-800-FDA-1088. Where should I keep my medicine? Keep out of the reach of children. Store in a refrigerator between 2 and 8 degrees C (36 and 46 degrees F). Do not freeze. Do not shake. Throw away any unused portion if using a single-dose vial. Throw away any unused medicine after the expiration date. NOTE: This sheet is a summary. It may not cover all possible information. If you have questions about this medicine, talk to your doctor, pharmacist, or health care provider.  2015, [employer]. (2008-05-10 10:23:57)  

## 2016-11-01 ENCOUNTER — Other Ambulatory Visit (HOSPITAL_COMMUNITY): Payer: Self-pay | Admitting: *Deleted

## 2016-11-05 ENCOUNTER — Encounter (HOSPITAL_COMMUNITY)
Admission: RE | Admit: 2016-11-05 | Discharge: 2016-11-05 | Disposition: A | Discharge status: Home / Self Care | Payer: Medicare Other | Source: Ambulatory Visit | Attending: Rheumatology | Admitting: Rheumatology

## 2016-11-05 DIAGNOSIS — D509 Iron deficiency anemia, unspecified: Secondary | ICD-10-CM | POA: Diagnosis not present

## 2016-11-05 DIAGNOSIS — M069 Rheumatoid arthritis, unspecified: Secondary | ICD-10-CM | POA: Insufficient documentation

## 2016-11-05 DIAGNOSIS — N289 Disorder of kidney and ureter, unspecified: Secondary | ICD-10-CM | POA: Diagnosis not present

## 2016-11-05 DIAGNOSIS — D649 Anemia, unspecified: Secondary | ICD-10-CM | POA: Diagnosis present

## 2016-11-05 MED ORDER — SODIUM CHLORIDE 0.9 % IV SOLN
1000.0000 mg | INTRAVENOUS | Status: DC
  Administered 2016-11-05: 1000 mg via INTRAVENOUS
  Filled 2016-11-05: qty 40

## 2016-11-05 MED ORDER — SODIUM CHLORIDE 0.9 % IV SOLN
INTRAVENOUS | Status: DC
  Administered 2016-11-05: 11:00:00 via INTRAVENOUS

## 2016-11-06 DIAGNOSIS — H1859 Other hereditary corneal dystrophies: Secondary | ICD-10-CM | POA: Diagnosis not present

## 2016-11-06 DIAGNOSIS — H1851 Endothelial corneal dystrophy: Secondary | ICD-10-CM | POA: Diagnosis not present

## 2016-11-14 ENCOUNTER — Other Ambulatory Visit: Payer: Self-pay | Admitting: *Deleted

## 2016-11-14 NOTE — Patient Outreach (Signed)
[facility] [facility]) Care Management  11/14/2016  Jessica Howard Oct 18, 1960 [id number]   RN Health Coach attempted #1  Follow up outreach call to patient.  Patient was unavailable. HIPPA compliance voicemail message was left with return callback number.  Plan: RN will call patient again within 14 days.    [place] Care Management [phone]

## 2016-11-19 ENCOUNTER — Encounter: Payer: Self-pay | Admitting: *Deleted

## 2016-11-19 DIAGNOSIS — H1851 Endothelial corneal dystrophy: Secondary | ICD-10-CM | POA: Diagnosis not present

## 2016-11-19 DIAGNOSIS — H16142 Punctate keratitis, left eye: Secondary | ICD-10-CM | POA: Diagnosis not present

## 2016-11-19 DIAGNOSIS — H1859 Other hereditary corneal dystrophies: Secondary | ICD-10-CM | POA: Diagnosis not present

## 2016-11-19 DIAGNOSIS — L309 Dermatitis, unspecified: Secondary | ICD-10-CM | POA: Diagnosis not present

## 2016-11-20 DIAGNOSIS — Z794 Long term (current) use of insulin: Secondary | ICD-10-CM | POA: Diagnosis not present

## 2016-11-20 DIAGNOSIS — E119 Type 2 diabetes mellitus without complications: Secondary | ICD-10-CM | POA: Diagnosis not present

## 2016-11-20 DIAGNOSIS — G8929 Other chronic pain: Secondary | ICD-10-CM | POA: Diagnosis not present

## 2016-11-20 DIAGNOSIS — Z79899 Other long term (current) drug therapy: Secondary | ICD-10-CM | POA: Diagnosis not present

## 2016-11-20 DIAGNOSIS — M5441 Lumbago with sciatica, right side: Secondary | ICD-10-CM | POA: Diagnosis not present

## 2016-11-25 ENCOUNTER — Telehealth: Payer: Self-pay | Admitting: Podiatry

## 2016-11-25 DIAGNOSIS — M7581 Other shoulder lesions, right shoulder: Secondary | ICD-10-CM | POA: Diagnosis not present

## 2016-11-25 DIAGNOSIS — M7582 Other shoulder lesions, left shoulder: Secondary | ICD-10-CM | POA: Diagnosis not present

## 2016-11-25 DIAGNOSIS — M542 Cervicalgia: Secondary | ICD-10-CM | POA: Diagnosis not present

## 2016-11-25 NOTE — Telephone Encounter (Signed)
Left message for patient to call back to schedule appointment with Liliane Channel to pick-up diabetic shoes.

## 2016-11-28 ENCOUNTER — Ambulatory Visit: Payer: Self-pay | Admitting: *Deleted

## 2016-11-28 ENCOUNTER — Ambulatory Visit (INDEPENDENT_AMBULATORY_CARE_PROVIDER_SITE_OTHER): Payer: Medicare Other | Admitting: Orthotics

## 2016-11-28 DIAGNOSIS — E1140 Type 2 diabetes mellitus with diabetic neuropathy, unspecified: Secondary | ICD-10-CM | POA: Diagnosis not present

## 2016-11-28 DIAGNOSIS — M2141 Flat foot [pes planus] (acquired), right foot: Secondary | ICD-10-CM | POA: Diagnosis not present

## 2016-11-28 DIAGNOSIS — M2142 Flat foot [pes planus] (acquired), left foot: Secondary | ICD-10-CM | POA: Diagnosis not present

## 2016-11-28 DIAGNOSIS — E1149 Type 2 diabetes mellitus with other diabetic neurological complication: Secondary | ICD-10-CM

## 2016-11-28 DIAGNOSIS — M21619 Bunion of unspecified foot: Secondary | ICD-10-CM | POA: Diagnosis not present

## 2016-11-28 NOTE — Progress Notes (Signed)
Patient came in today to pick up diabetic shoes and custom inserts.  Same was well pleased with fit and function.   The foot ortheses offered full contact with plantar surface and contoured the arch well.   The shoes fit well with no heel slippage and areas of pressure concern.   Patient advised to contact us if any problems arise. 

## 2016-11-29 ENCOUNTER — Other Ambulatory Visit (HOSPITAL_COMMUNITY): Payer: Self-pay | Admitting: *Deleted

## 2016-12-02 ENCOUNTER — Ambulatory Visit (HOSPITAL_COMMUNITY)
Admission: RE | Admit: 2016-12-02 | Discharge: 2016-12-02 | Disposition: A | Discharge status: Home / Self Care | Payer: Medicare Other | Source: Ambulatory Visit | Attending: Rheumatology | Admitting: Rheumatology

## 2016-12-02 DIAGNOSIS — M069 Rheumatoid arthritis, unspecified: Secondary | ICD-10-CM | POA: Insufficient documentation

## 2016-12-02 MED ORDER — SODIUM CHLORIDE 0.9 % IV SOLN
INTRAVENOUS | Status: DC
  Administered 2016-12-02: 10:00:00 via INTRAVENOUS

## 2016-12-02 MED ORDER — SODIUM CHLORIDE 0.9 % IV SOLN
1000.0000 mg | INTRAVENOUS | Status: DC
  Filled 2016-12-02: qty 40

## 2016-12-02 MED ORDER — SODIUM CHLORIDE 0.9 % IV SOLN
1000.0000 mg | Freq: Once | INTRAVENOUS | Status: AC
  Administered 2016-12-02: 10:00:00 1000 mg via INTRAVENOUS
  Filled 2016-12-02: qty 40

## 2016-12-04 ENCOUNTER — Other Ambulatory Visit (HOSPITAL_BASED_OUTPATIENT_CLINIC_OR_DEPARTMENT_OTHER): Payer: Medicare Other

## 2016-12-04 ENCOUNTER — Ambulatory Visit (HOSPITAL_BASED_OUTPATIENT_CLINIC_OR_DEPARTMENT_OTHER): Payer: Medicare Other

## 2016-12-04 ENCOUNTER — Telehealth: Payer: Self-pay | Admitting: Oncology

## 2016-12-04 ENCOUNTER — Ambulatory Visit (HOSPITAL_BASED_OUTPATIENT_CLINIC_OR_DEPARTMENT_OTHER): Payer: Medicare Other | Admitting: Oncology

## 2016-12-04 VITALS — BP 153/83 | HR 78 | Temp 98.9°F | Resp 18 | Ht 63.0 in | Wt 267.9 lb

## 2016-12-04 DIAGNOSIS — M069 Rheumatoid arthritis, unspecified: Secondary | ICD-10-CM

## 2016-12-04 DIAGNOSIS — N189 Chronic kidney disease, unspecified: Secondary | ICD-10-CM | POA: Diagnosis not present

## 2016-12-04 DIAGNOSIS — D631 Anemia in chronic kidney disease: Secondary | ICD-10-CM | POA: Diagnosis not present

## 2016-12-04 DIAGNOSIS — N289 Disorder of kidney and ureter, unspecified: Secondary | ICD-10-CM

## 2016-12-04 DIAGNOSIS — D569 Thalassemia, unspecified: Secondary | ICD-10-CM

## 2016-12-04 DIAGNOSIS — D638 Anemia in other chronic diseases classified elsewhere: Secondary | ICD-10-CM

## 2016-12-04 DIAGNOSIS — D509 Iron deficiency anemia, unspecified: Secondary | ICD-10-CM

## 2016-12-04 LAB — CBC WITH DIFFERENTIAL/PLATELET
BASO%: 0.3 % (ref 0.0–2.0)
BASOS ABS: 0 10*3/uL (ref 0.0–0.1)
EOS%: 1.9 % (ref 0.0–7.0)
Eosinophils Absolute: 0.2 10*3/uL (ref 0.0–0.5)
HCT: 34.5 % — ABNORMAL LOW (ref 34.8–46.6)
HEMOGLOBIN: 10.7 g/dL — AB (ref 11.6–15.9)
LYMPH#: 4.9 10*3/uL — AB (ref 0.9–3.3)
LYMPH%: 39.6 % (ref 14.0–49.7)
MCH: 20.4 pg — ABNORMAL LOW (ref 25.1–34.0)
MCHC: 31 g/dL — ABNORMAL LOW (ref 31.5–36.0)
MCV: 65.7 fL — AB (ref 79.5–101.0)
MONO#: 1.1 10*3/uL — ABNORMAL HIGH (ref 0.1–0.9)
MONO%: 8.5 % (ref 0.0–14.0)
NEUT#: 6.2 10*3/uL (ref 1.5–6.5)
NEUT%: 49.7 % (ref 38.4–76.8)
NRBC: 0 % (ref 0–0)
PLATELETS: 325 10*3/uL (ref 145–400)
RBC: 5.25 10*6/uL (ref 3.70–5.45)
RDW: 17.7 % — AB (ref 11.2–14.5)
WBC: 12.4 10*3/uL — ABNORMAL HIGH (ref 3.9–10.3)

## 2016-12-04 MED ORDER — DARBEPOETIN ALFA 300 MCG/0.6ML IJ SOSY
300.0000 ug | PREFILLED_SYRINGE | Freq: Once | INTRAMUSCULAR | Status: AC
  Administered 2016-12-04: 300 ug via SUBCUTANEOUS
  Filled 2016-12-04: qty 0.6

## 2016-12-04 NOTE — Progress Notes (Signed)
Hematology and Oncology Follow Up Visit  Jessica Howard [id number] 1961/02/23 56 y.o. 12/04/2016 10:23 AM   Principle Diagnosis: 56 year old female with anemia of renal disease. She has also microcytic anemia due to beta thalassemia. She also has element of anemia of chronic disease with history of rheumatoid arthritis.  Current therapy: Aranesp 300 g every 4 weeks to keep her hemoglobin above 10.  Interim History:  Jessica Howard presents today for a follow-up visit. Since her last visit, she reports feeling slightly fatigued than usual. She denied any hematochezia or melena. She denied any epistaxis or abdominal distention.. She denied any recent illnesses or hospitalizations. She continues to receive Aranesp injections without complications. She feels improvement in her quality of life after receiving it. She denied any thrombosis or bleeding episodes. She denied any shortness of breath or difficulty breathing. She still able to attend to her activities of daily living at this time.  She did not report headaches or blurry vision or syncope. She did not report any chest pain or palpitation. She is not reporting nausea, vomiting, abdominal pain. She does not report any hematochezia or bleeding per rectum. Does not report any frequency urgency or hesitancy. She does not report any hematuria hematochezia or melena. Remainder of her review of systems unremarkable.   Medications: I have reviewed the patient's current medications.  Current Outpatient Prescriptions  Medication Sig Dispense Refill  . abatacept (ORENCIA) 250 MG injection Inject 1,000 mg into the vein every 28 (twenty-eight) days. Monthly Infusion    . acetaminophen-codeine (TYLENOL #3) 300-30 MG tablet Take 1 tablet by mouth as directed.    Marland Kitchen albuterol (PROVENTIL HFA;VENTOLIN HFA) 108 (90 Base) MCG/ACT inhaler Inhale 2 puffs into the lungs every 6 (six) hours as needed for wheezing or shortness of breath.    Marland Kitchen amitriptyline (ELAVIL)  25 MG tablet Take 25 mg by mouth at bedtime.     . ARTIFICIAL TEAR OP Place 1 drop into both eyes as needed (for dry eyes).    . BYSTOLIC 10 MG tablet     . chlorthalidone (HYGROTON) 25 MG tablet Take 25 mg by mouth daily.    . CRESTOR 20 MG tablet Take 20 mg by mouth daily.     . cyclobenzaprine (FLEXERIL) 10 MG tablet     . diltiazem (CARDIZEM CD) 240 MG 24 hr capsule Take 240 mg by mouth daily.     . fluticasone (FLONASE) 50 MCG/ACT nasal spray Place 1 spray into both nostrils daily as needed for allergies or rhinitis.    Marland Kitchen gabapentin (NEURONTIN) 300 MG capsule Take 600 mg by mouth 3 (three) times daily.     Marland Kitchen HYDROcodone-acetaminophen (NORCO/VICODIN) 5-325 MG tablet Take 1 tablet by mouth every 6 (six) hours as needed for moderate pain.    Marland Kitchen insulin aspart (NOVOLOG) 100 UNIT/ML injection Inject 55-60 Units into the skin 3 (three) times daily before meals. 55 base Plus sliding scale    . insulin NPH (HUMULIN N,NOVOLIN N) 100 UNIT/ML injection Inject 75 Units into the skin 3 (three) times daily with meals. 75 units with breakfast; lunch 75 units; 75 units at dinner    . irbesartan (AVAPRO) 300 MG tablet Take 300 mg by mouth daily.     Marland Kitchen leflunomide (ARAVA) 20 MG tablet Take 20 mg by mouth daily.    . metFORMIN (GLUCOPHAGE-XR) 500 MG 24 hr tablet Take 500 mg by mouth every evening.    . Nebivolol HCl (BYSTOLIC) 20 MG TABS Take 20 mg by  mouth daily.     . pantoprazole (PROTONIX) 40 MG tablet Take 40 mg by mouth daily.    . predniSONE (DELTASONE) 5 MG tablet Take 5 mg by mouth daily.    Marland Kitchen tiZANidine (ZANAFLEX) 4 MG tablet Take 4 mg by mouth 2 (two) times daily as needed (migraines). Limit 2 days per week    . tobramycin (TOBREX) 0.3 % ophthalmic ointment Place 1 application into the left eye 3 (three) times daily.    Marland Kitchen topiramate (TOPAMAX) 200 MG tablet Take 200 mg by mouth daily.    . TRULICITY 8.14 GY/1.8HU SOPN Inject 0.75 mg into the skin once a week. Patient takes on Wednesday.    .  VOLTAREN 1 % GEL Apply 1 application topically 3 (three) times daily as needed (for pain).      No current facility-administered medications for this visit.     Allergies:  Allergies  Allergen Reactions  . Bactrim [Sulfamethoxazole-Trimethoprim] Hives  . Cefuroxime Axetil Itching  . Cephalosporins Itching  . Iohexol Itching and Rash     Code: RASH, Desc: HAD ITCHING AND A RASH ABOUT ONE HOUR AFTER RETURNING HOME FROM THE CT, Onset Date: [id number]   . Lisinopril Cough  . Penicillins Hives  . Sulfa Antibiotics Hives    Past Medical History, Surgical history, Social history, and Family History were reviewed and updated.   Blood pressure (!) 153/83, pulse 78, temperature 98.9 F (37.2 C), temperature source Oral, resp. rate 18, height 5\' 3"  (1.6 m), weight 267 lb 14.4 oz (121.5 kg), last menstrual period 12/27/2013, SpO2 97 %. ECOG: 1 General appearance: Alert, awake woman without distress. Head: Normocephalic, without obvious abnormality. No oral thrush or ulcers. Neck: no adenopathy Lymph nodes: Cervical, supraclavicular, and axillary nodes normal. Heart:regular rate and rhythm, S1, S2 normal, no murmur, click, rub or gallop Lung:chest clear, no wheezing, rales, normal symmetric air entry Abdomin: soft, non-tender, without masses or organomegaly. No rebound or guarding. EXT: Mild edema noted in her lower extremities.    CBC    Component Value Date/Time   WBC 12.4 (H) 12/04/2016 0947   WBC 11.9 (H) 01/19/2016 1350   RBC 5.25 12/04/2016 0947   RBC 5.08 01/19/2016 1350   HGB 10.7 (L) 12/04/2016 0947   HCT 34.5 (L) 12/04/2016 0947   PLT 325 12/04/2016 0947   MCV 65.7 (L) 12/04/2016 0947   MCH 20.4 (L) 12/04/2016 0947   MCH 20.1 (L) 01/19/2016 1350   MCHC 31.0 (L) 12/04/2016 0947   MCHC 30.1 01/19/2016 1350   RDW 17.7 (H) 12/04/2016 0947   LYMPHSABS 4.9 (H) 12/04/2016 0947   MONOABS 1.1 (H) 12/04/2016 0947   EOSABS 0.2 12/04/2016 0947   BASOSABS 0.0 12/04/2016 0947        Impression and Plan:  56 year old female with the following issues:   1. Anemia of renal insufficiency: Her hemoglobin today is 10.3 and we will require Aranesp. She has tolerated this therapy well and the plan is to continue with the monthly monitoring and she will receive Aranesp to boost her hemoglobin above 10.   Her hemoglobin is above 10 today but she will receive the injection given her excessive fatigue and tiredness. We will resume Aranesp on a monthly basis to keep her hemoglobin above 10.  2. 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 Her iron studies in December 2018 were normal and will be repeated in the near future.  3. Rheumatoid arthritis: Likely contributing to  chronic anemia related to chronic inflammatory process. She is currently on Orencia which have helped her symptoms.   4. Follow-up: Will be in 5 months to monitor her clinical status.  [facility], MD 6/27/201810:23 AM

## 2016-12-04 NOTE — Patient Instructions (Signed)
Darbepoetin Alfa injection What is this medicine? DARBEPOETIN ALFA (dar be POE e tin AL fa) helps your body make more red blood cells. It is used to treat anemia caused by chronic kidney failure and chemotherapy. This medicine may be used for other purposes; ask your health care provider or pharmacist if you have questions. COMMON BRAND NAME(S): Aranesp What should I tell my health care provider before I take this medicine? They need to know if you have any of these conditions: -blood clotting disorders or history of blood clots -cancer patient not on chemotherapy -cystic fibrosis -heart disease, such as angina, heart failure, or a history of a heart attack -hemoglobin level of 12 g/dL or greater -high blood pressure -low levels of folate, iron, or vitamin B12 -seizures -an unusual or allergic reaction to darbepoetin, erythropoietin, albumin, hamster proteins, latex, other medicines, foods, dyes, or preservatives -pregnant or trying to get pregnant -breast-feeding How should I use this medicine? This medicine is for injection into a vein or under the skin. It is usually given by a health care professional in a hospital or clinic setting. If you get this medicine at home, you will be taught how to prepare and give this medicine. Do not shake the solution before you withdraw a dose. Use exactly as directed. Take your medicine at regular intervals. Do not take your medicine more often than directed. It is important that you put your used needles and syringes in a special sharps container. Do not put them in a trash can. If you do not have a sharps container, call your pharmacist or healthcare provider to get one. Talk to your pediatrician regarding the use of this medicine in children. While this medicine may be used in children as young as 1 year for selected conditions, precautions do apply. Overdosage: If you think you have taken too much of this medicine contact a poison control center or  emergency room at once. NOTE: This medicine is only for you. Do not share this medicine with others. What if I miss a dose? If you miss a dose, take it as soon as you can. If it is almost time for your next dose, take only that dose. Do not take double or extra doses. What may interact with this medicine? Do not take this medicine with any of the following medications: -epoetin alfa This list may not describe all possible interactions. Give your health care provider a list of all the medicines, herbs, non-prescription drugs, or dietary supplements you use. Also tell them if you smoke, drink alcohol, or use illegal drugs. Some items may interact with your medicine. What should I watch for while using this medicine? Visit your prescriber or health care professional for regular checks on your progress and for the needed blood tests and blood pressure measurements. It is especially important for the doctor to make sure your hemoglobin level is in the desired range, to limit the risk of potential side effects and to give you the best benefit. Keep all appointments for any recommended tests. Check your blood pressure as directed. Ask your doctor what your blood pressure should be and when you should contact him or her. As your body makes more red blood cells, you may need to take iron, folic acid, or vitamin B supplements. Ask your doctor or health care provider which products are right for you. If you have kidney disease continue dietary restrictions, even though this medication can make you feel better. Talk with your doctor or health   care professional about the foods you eat and the vitamins that you take. What side effects may I notice from receiving this medicine? Side effects that you should report to your doctor or health care professional as soon as possible: -allergic reactions like skin rash, itching or hives, swelling of the face, lips, or tongue -breathing problems -changes in vision -chest  pain -confusion, trouble speaking or understanding -feeling faint or lightheaded, falls -high blood pressure -muscle aches or pains -pain, swelling, warmth in the leg -rapid weight gain -severe headaches -sudden numbness or weakness of the face, arm or leg -trouble walking, dizziness, loss of balance or coordination -seizures (convulsions) -swelling of the ankles, feet, hands -unusually weak or tired Side effects that usually do not require medical attention (report to your doctor or health care professional if they continue or are bothersome): -diarrhea -fever, chills (flu-like symptoms) -headaches -nausea, vomiting -redness, stinging, or swelling at site where injected This list may not describe all possible side effects. Call your doctor for medical advice about side effects. You may report side effects to FDA at 1-800-FDA-1088. Where should I keep my medicine? Keep out of the reach of children. Store in a refrigerator between 2 and 8 degrees C (36 and 46 degrees F). Do not freeze. Do not shake. Throw away any unused portion if using a single-dose vial. Throw away any unused medicine after the expiration date. NOTE: This sheet is a summary. It may not cover all possible information. If you have questions about this medicine, talk to your doctor, pharmacist, or health care provider.  2015, [employer]. (2008-05-10 10:23:57)  

## 2016-12-04 NOTE — Telephone Encounter (Signed)
Appointments scheduled throughout 04/2017, per 12/04/16 los. Patient was given a copy of the AVS report and appointment schedule per 12/04/16 los.

## 2016-12-04 NOTE — Progress Notes (Signed)
Injection given today with 10.7 hemoglobin per Dr. Alen Blew

## 2016-12-06 ENCOUNTER — Other Ambulatory Visit: Payer: Self-pay | Admitting: Licensed Clinical Social Worker

## 2016-12-06 ENCOUNTER — Other Ambulatory Visit: Payer: Self-pay | Admitting: *Deleted

## 2016-12-06 ENCOUNTER — Ambulatory Visit: Payer: Self-pay | Admitting: *Deleted

## 2016-12-06 DIAGNOSIS — E0800 Diabetes mellitus due to underlying condition with hyperosmolarity without nonketotic hyperglycemic-hyperosmolar coma (NKHHC): Principal | ICD-10-CM

## 2016-12-06 DIAGNOSIS — Z794 Long term (current) use of insulin: Secondary | ICD-10-CM

## 2016-12-06 NOTE — Patient Outreach (Signed)
[facility]) Care Management  12/06/2016   Jessica Howard 12/28/1960 [id number]  RN Health Coach telephone call to patient.  [employer] compliance verified. Per patient she is awakening at night  Skin wet and clammy. When she checked her bloods sugars she is running from 45 to 50's.  Per patient this is happening 2-3 times a week. Per patient she didn't take her insulin that day then two days later she would run in the 200's to 300's. RN discussed with patient to document all blood sugars and the amount of insulin she has taken. RN explained that this information needs to be given to her physician to look at what adjustments needs to be made. Per patient she is checking her blood sugars 3-4 times a day. Patient stated she is checking fasting and if she has eaten she waits until 2 hrs afterwards to check.  Per patient she has received her diabetic shoes and they fit well. Per patient she has lost weight down to 366 pounds. Patient is wanting to go to [facility] and does not have the financial means. RN [facility] and was told to have the patient fill out the financial aid form on line and send in with the documentation requested. It will be looked at on a financial subsidy basis. RN also referred to Education officer, museum for any additional financial means available.  Per patient she is getting ready to start physical therapy on her shoulder. Per patient she is trying to eat healthier. Patient has agreed to follow up outreach calls.     Current Medications:  Current Outpatient Prescriptions  Medication Sig Dispense Refill  . abatacept (ORENCIA) 250 MG injection Inject 1,000 mg into the vein every 28 (twenty-eight) days. Monthly Infusion    . albuterol (PROVENTIL HFA;VENTOLIN HFA) 108 (90 Base) MCG/ACT inhaler Inhale 2 puffs into the lungs every 6 (six) hours as needed for wheezing or shortness of breath.    Marland Kitchen amitriptyline (ELAVIL) 25 MG tablet Take 25 mg by mouth at bedtime.     . ARTIFICIAL TEAR  OP Place 1 drop into both eyes as needed (for dry eyes).    . BYSTOLIC 10 MG tablet     . chlorthalidone (HYGROTON) 25 MG tablet Take 25 mg by mouth daily.    . CRESTOR 20 MG tablet Take 20 mg by mouth daily.     . cyclobenzaprine (FLEXERIL) 10 MG tablet     . diltiazem (CARDIZEM CD) 240 MG 24 hr capsule Take 240 mg by mouth daily.     . fluticasone (FLONASE) 50 MCG/ACT nasal spray Place 1 spray into both nostrils daily as needed for allergies or rhinitis.    Marland Kitchen gabapentin (NEURONTIN) 300 MG capsule Take 600 mg by mouth 3 (three) times daily.     Marland Kitchen HYDROcodone-acetaminophen (NORCO/VICODIN) 5-325 MG tablet Take 1 tablet by mouth every 6 (six) hours as needed for moderate pain.    Marland Kitchen insulin aspart (NOVOLOG) 100 UNIT/ML injection Inject 55-60 Units into the skin 3 (three) times daily before meals. 55 base Plus sliding scale    . insulin NPH (HUMULIN N,NOVOLIN N) 100 UNIT/ML injection Inject 75 Units into the skin 3 (three) times daily with meals. 75 units with breakfast; lunch 75 units; 75 units at dinner    . irbesartan (AVAPRO) 300 MG tablet Take 300 mg by mouth daily.     Marland Kitchen leflunomide (ARAVA) 20 MG tablet Take 20 mg by mouth daily.    . metFORMIN (GLUCOPHAGE-XR) 500  MG 24 hr tablet Take 500 mg by mouth every evening.    . Nebivolol HCl (BYSTOLIC) 20 MG TABS Take 20 mg by mouth daily.     . pantoprazole (PROTONIX) 40 MG tablet Take 40 mg by mouth daily.    Marland Kitchen tiZANidine (ZANAFLEX) 4 MG tablet Take 4 mg by mouth 2 (two) times daily as needed (migraines). Limit 2 days per week    . tobramycin (TOBREX) 0.3 % ophthalmic ointment Place 1 application into the left eye 3 (three) times daily.    Marland Kitchen topiramate (TOPAMAX) 200 MG tablet Take 200 mg by mouth daily.    . TRULICITY 5.88 FO/2.7XA SOPN Inject 0.75 mg into the skin once a week. Patient takes on Wednesday.    . VOLTAREN 1 % GEL Apply 1 application topically 3 (three) times daily as needed (for pain).      No current facility-administered medications  for this visit.     Functional Status:  In your present state of health, do you have any difficulty performing the following activities: 12/06/2016 12/02/2016  Hearing? N N  Vision? N N  Difficulty concentrating or making decisions? N N  Walking or climbing stairs? Y N  Dressing or bathing? N N  Doing errands, shopping? Y -  Conservation officer, nature and eating ? N -  Using the Toilet? N -  In the past six months, have you accidently leaked urine? N -  Do you have problems with loss of bowel control? N -  Managing your Medications? N -  Managing your Finances? N -  Housekeeping or managing your Housekeeping? Y -  Some recent data might be hidden    Fall/Depression Screening: Fall Risk  12/06/2016 10/14/2016 08/26/2016  Falls in the past year? No No No  Number falls in past yr: - - -  Risk for fall due to : Impaired balance/gait;Impaired mobility - -   PHQ 2/9 Scores 12/06/2016 10/14/2016 08/26/2016 03/29/2016 02/28/2016 04/20/2014 02/16/2014  PHQ - 2 Score 0 0 0 0 0 0 0   THN CM Care Plan Problem One     Most Recent Value  Care Plan Problem One  Knowledge Deficit in self management of diabetes  Role Documenting the Problem One  [place] for Problem One  Active  THN Long Term Goal   Patient will see a decrease in Hgb A1C within the next 90 days from 10.0  THN Long Term Goal Start Date  12/06/16  Interventions for Problem One Long Term Goal  RN sent patient educational material on A1C. RN discussed with patient what the A1C means abd how the blood sugars affect the A1C.RN discussed what the fasting blood sugar should be to make the A1C below 7   THN CM Short Term Goal #1   Patient will report checking into water aerobics within the next 30 days  THN CM Short Term Goal #1 Start Date  12/06/16  Interventions for Short Term Goal #1  RN discussed with patient the importance of weight loss. RN discussd classes available for medicaid and medicare recipients at the [facility]  [facility] CM Short Term Goal  #2   Patient will report her fasting blood sugar is below 200 within the next 30 days  THN CM Short Term Goal #2 Start Date  12/06/16  Interventions for Short Term Goal #2  RN discussed the importance of fasting blood sugar range and 2 hr pc range. RN will send calendar book for documenting. RN will  follow up next outreach call  [facility] CM Short Term Goal #3  Patient will not have any hypoglycemic reactons within the next 30 days  THN CM Short Term Goal #3 Start Date  12/06/16  Interventions for Short Tern Goal #3  RN discussed with patient the signs and symptoms of hypoglycemic reactions. RN reiterated with patient about action plan for reactions/ N discussed close monitoring and documentation with insulin dosages taken to discuss with physician. RN will follow up with additional discussion and teach back  THN CM Short Term Goal #4  Patient will be able to describe  healthy snack within the next 30 days  THN CM Short Term Goal #4 Start Date  12/06/16  Interventions for Short Term Goal #4  RN sent a list of healthy snacks. RN will send a list of diabetic foods from fast food chain. RN will follow up discussion and teach back   THN CM Short Term Goal #5   Patient will be able to describe foods low in carbohydrates within the next 30 days  THN CM Short Term Goal #5 Start Date  12/06/16  Interventions for Short Term Goal #5  RN sent EMMI educational material Diabetes food and exercise, Counting carbohydrates. RN sent a chart with high and low carbohydrates pictures. RN will follow up with discussion and teach back      Assessment:  Patient has received diabetic shoes Patient is having  hypoglycemic reactions with blood sugars in 40-50's Patient is having hyperglycemic reactions Patient needs financial assistance forcost of [employer] membership Patient will continue to  benefit from [employer] telephonic outreach for education and support for diabetes self management.   Plan:  RN discussed hypoglycemia  and action plan RN discussed hyperglycemia and action plan RN discussed close monitoring of blood sugars documenting and reporting to physician RN called [employer] and informed patient of financial application to fill out for assistance RN referred to social worker  RN discussed healthy eating RN will follow up within the month of July  [place] [place] [facility] [phone]

## 2016-12-06 NOTE — Patient Outreach (Signed)
[facility]) Care Management  12/06/2016  SHERRAL DIROCCO 1961/01/26 [id number]  Assessment- CSW received new referral on patient on 12/06/16. Patient is interested in going to the [facility] but is unable to afford a membership. Patient has no financial means to afford membership and is on Medicaid. CSW completed call to [place] and discussed case. Patient would be able to go to the [facility] for free through [facility] RN) but patient states that this location is too far from her residence. Patient is interested in going to a closer [employer]. [place] contacted [employer] and was informed that patient would need to fill out their financial assistance form and turn it in in order to gain their services on a sliding scale fee. CSW also discussed the [facility] with [facility] and location is only 2.7 miles away from patient.   CSW completed initial outreach attempt to patient's residence and patient's daughter answered. Daughter provided HIPPA verifications and stated that patient was currently not home but would contact her back once she returns.  Plan-CSW will await for return call from patient or complete additional outreach within one week.  Eula Fried, BSW, MSW, [email or web address] Phone: [phone] Fax: [phone]

## 2016-12-09 ENCOUNTER — Other Ambulatory Visit: Payer: Self-pay | Admitting: Licensed Clinical Social Worker

## 2016-12-09 NOTE — Patient Outreach (Signed)
[facility]) Care Management  12/09/2016  Jessica Howard January 06, 1961 [id number]  Assessment- CSW completed outreach call to patient and patient answered successfully. Patient provided HIPPA verifications. CSW introduced self, reason for call and of [facility] services. Patient is wanting to go to the [facility] but cannot afford a membership. However, patient can potentially qualify for their financial program which would allow her to a decreased membership based on a sliding scale fee. CSW provided education on this. CSW reviewed entire financial assistance application and answered several of patient's questions. The financial application is 6 pages long and requires patient to provide certain documents. Patient shares that she feels comfortable completing this herself but if she needs assistance she will ask her daughter. CSW will mail financial assistance form to patient's residence for her to complete. After completion, patient will either mail document back to [facility] or go to office and provide document in person. CSW encouraged patient to contact this CSW if she had any additional questions. CSW informed patient that she could consider going to the [facility] for aquatic classes as well. CSW informed patient this is 2.7 miles away from her residence (location is important to patient.) Patient shares that she has a friend that goes to the [facility] and that she could ride with her if needed. CSW completed call to [facility] while on the other line with patient. CSW was informed that patient would need to be 56 years of age in order to attend any day class or activity. However, patient eligible to attend any class or activity after 5 pm. Patient could attend the "[name]" program which is held 2x a week for 6 weeks and cost $60. Patient appreciative of this information. Patient is agreeable to CSW mailing out [place] and Aquatic information to her  residence. CSW also provided education on other socialization opportunities within the community. Patient denies any further social work needs and was very Patent attorney of social work assistance provided today. CSW will not open case at this time.  Plan-CSW will update THN RNCM. CSW will send request to mail out [employer] financial assistance form, [facility] information and Aquatic class information. CSW will not open case at this time.  Eula Fried, BSW, MSW, [email or web address] Phone: [phone] Fax: [phone]

## 2016-12-10 ENCOUNTER — Ambulatory Visit: Payer: Medicare Other | Attending: Orthopedic Surgery

## 2016-12-10 DIAGNOSIS — M069 Rheumatoid arthritis, unspecified: Secondary | ICD-10-CM | POA: Insufficient documentation

## 2016-12-10 DIAGNOSIS — M25512 Pain in left shoulder: Secondary | ICD-10-CM | POA: Insufficient documentation

## 2016-12-10 DIAGNOSIS — R252 Cramp and spasm: Secondary | ICD-10-CM | POA: Insufficient documentation

## 2016-12-10 DIAGNOSIS — M47812 Spondylosis without myelopathy or radiculopathy, cervical region: Secondary | ICD-10-CM

## 2016-12-10 DIAGNOSIS — M4692 Unspecified inflammatory spondylopathy, cervical region: Secondary | ICD-10-CM | POA: Insufficient documentation

## 2016-12-10 DIAGNOSIS — G8929 Other chronic pain: Secondary | ICD-10-CM | POA: Diagnosis not present

## 2016-12-10 DIAGNOSIS — M25511 Pain in right shoulder: Secondary | ICD-10-CM | POA: Diagnosis not present

## 2016-12-10 DIAGNOSIS — R293 Abnormal posture: Secondary | ICD-10-CM | POA: Diagnosis not present

## 2016-12-10 DIAGNOSIS — M6281 Muscle weakness (generalized): Secondary | ICD-10-CM | POA: Insufficient documentation

## 2016-12-10 DIAGNOSIS — M542 Cervicalgia: Secondary | ICD-10-CM

## 2016-12-10 NOTE — Therapy (Signed)
[place], [place], [address] Phone: [phone]   Fax:  [phone]  Physical Therapy Evaluation  Patient Details  Name: Jessica Howard MRN: [id number] Date of Birth: [birth year] Referring Provider: Elsie Saas, MD  Encounter Date: 12/10/2016      PT End of Session - 12/10/16 0948    Visit Number 1   Number of Visits 16   Date for PT Re-Evaluation 01/31/17   Authorization Type MCR   PT Start Time 0953   PT Stop Time 1045   PT Time Calculation (min) 52 min   Activity Tolerance Patient tolerated treatment well   Behavior During Therapy [facility] for tasks assessed/performed      Past Medical History:  Diagnosis Date  . Asthma   . Diabetes mellitus    type 2  . Diabetic neuropathy ([place])   . Endometrial polyp   . Fibroadenoma of breast    left  . GERD (gastroesophageal reflux disease)   . Headache    migraines  . Hepatic steatosis   . Hyperlipidemia   . Hypertension   . Impingement syndrome of right shoulder   . Nontraumatic tear of right rotator cuff   . Pneumonia   . Psoriasis   . Rheumatoid arthritis(714.0)    rhematoid and osteoarthritis  . Sleep apnea   . Thyroid disease    had radiation  . Unspecified deficiency anemia    Thalassemia    Past Surgical History:  Procedure Laterality Date  . ANKLE ARTHROSCOPY Right   . CESAREAN SECTION    . COLONOSCOPY    . DILATION AND CURETTAGE OF UTERUS    . EYE SURGERY     surgery on retina  . HAND SURGERY     for arthritis  . left knee surgery nodule removal    . OTHER SURGICAL HISTORY     multiple nodule removal on neck, hands, left knee  . SHOULDER ARTHROSCOPY WITH ROTATOR CUFF REPAIR AND SUBACROMIAL DECOMPRESSION Right 05/20/2014   Procedure: RIGHT SHOULDER ARTHROSCOPY WITH DEBRIDEMENT/DISTAL CLAVICLE EXCISION/ROTATOR CUFF REPAIR AND SUBACROMIAL DECOMPRESSION;  Surgeon: Lorn Junes, MD;  Location: [place];  Service: Orthopedics;   Laterality: Right;  . TONSILLECTOMY    . TUBAL LIGATION     bilateral    There were no vitals filed for this visit.       Subjective Assessment - 12/10/16 0957    Subjective She reports shoulder and neck pain. PAin started without injury.   No MRI.  She has been trying to wall walk fingers   Pertinent History RT RTC repair 2015, Bialteral wrist and finger surgery for degenerative changes    Limitations Lifting;House hold activities  sleep disturbed , reaching some days,      How long can you sit comfortably? No change   How long can you stand comfortably? NA   How long can you walk comfortably? NA   Diagnostic tests xrays: OA neck    Patient Stated Goals She wants to decreased neck and shoulder pain.    Currently in Pain? Yes   Pain Location Shoulder   Pain Orientation Right;Left   Pain Descriptors / Indicators Aching            [facility] PT Assessment - 12/10/16 0001      Assessment   Medical Diagnosis Cervical and shoulder pain   Referring Provider Elsie Saas, MD   Onset Date/Surgical Date --  3 months ago.    Next MD Visit  01/07/17   Prior Therapy PT post RTC repair     Precautions   Precautions None     Restrictions   Weight Bearing Restrictions No     Balance Screen   Has the patient fallen in the past 6 months No     Prior Function   Level of Independence --  children heavier cleaning and reaching overhead items   Vocation On disability     Cognition   Overall Cognitive Status Within Functional Limits for tasks assessed     Observation/Other Assessments   Focus on Therapeutic Outcomes (FOTO)  49% limited     Posture/Postural Control   Posture Comments forward head and rounded shoulders     ROM / Strength   AROM / PROM / Strength AROM;Strength     AROM   AROM Assessment Site Shoulder;Cervical   Right/Left Shoulder Right;Left   Right Shoulder Extension 31 Degrees   Right Shoulder Flexion 125 Degrees   Right Shoulder ABduction 121 Degrees    Right Shoulder Internal Rotation 50 Degrees   Right Shoulder External Rotation 45 Degrees   Right Shoulder Horizontal ABduction 5 Degrees   Right Shoulder Horizontal  ADduction 93 Degrees   Left Shoulder Extension 35 Degrees   Left Shoulder Flexion 125 Degrees   Left Shoulder ABduction 110 Degrees   Left Shoulder Internal Rotation 60 Degrees   Left Shoulder External Rotation 55 Degrees   Left Shoulder Horizontal ABduction 12 Degrees   Left Shoulder Horizontal ADduction 96 Degrees   Cervical Flexion 20   Cervical Extension 24   Cervical - Right Side Bend 22   Cervical - Left Side Bend 12   Cervical - Right Rotation 40   Cervical - Left Rotation 34     Strength   Overall Strength Comments Shulder strength WFL bilaterally.  when tested below shoulder height but passive motion in significantly better both shoulders      Palpation   Palpation comment Tender anterior shoulder and cervical paraspinals     Ambulation/Gait   Gait Comments WNL            Objective measurements completed on examination: See above findings.          [facility] Adult PT Treatment/Exercise - 12/10/16 0001      Modalities   Modalities Moist Heat     Moist Heat Therapy   Number Minutes Moist Heat 12 Minutes   Moist Heat Location Shoulder;Cervical     Manual Therapy   Manual Therapy Soft tissue mobilization;Passive ROM;Manual Traction                PT Education - 12/10/16 1036    Education provided Yes   Education Details POC , HEP  , dry needling handout, gentle with initial stretching   Person(s) Educated Patient   Methods Explanation;Demonstration;Tactile cues;Verbal cues;Handout   Comprehension Returned demonstration;Verbalized understanding          PT Short Term Goals - 12/10/16 0951      PT SHORT TERM GOAL #1   Title She will be independent with inital HEP   Time 4   Period Weeks   Status New     PT SHORT TERM GOAL #2   Title she will report neck pain decreased  30-40% or more    Time 4   Period Weeks   Status New     PT SHORT TERM GOAL #3   Title She will improve  active overhead reach to full available ROM  Time 4   Period Weeks   Status New     PT SHORT TERM GOAL #4   Title she will improve  cervical rotation to 50 degrees bilaterally    Time 4   Period Weeks   Status New           PT Long Term Goals - 12/10/16 3419      PT LONG TERM GOAL #1   Title she will be independent with all HEP issued   Time 8   Period Weeks   Status New     PT LONG TERM GOAL #2   Title She will improve use of arms to be able to  each into cabinets overhead    Time 8   Period Weeks   Status New     PT LONG TERM GOAL #3   Title She will report pain decreased 60% or more with using arms for slfcare and household tasks    Time 8   Period Weeks   Status New     PT LONG TERM GOAL #4   Title She will be able to demo understanding of good posture   Time 8   Period Weeks   Status New     PT LONG TERM GOAL #5   Title Cervical pain decreased 60% and become intermitttant    Time 8   Period Weeks   Status New                Plan - 12/10/16 0949    Clinical Impression Statement Ms Legaspi presents with compaint of neck and shoulder pain affecting use of arms and limiting  ADL's.    History and Personal Factors relevant to plan of care: RA, RTC repair 2015 bilateral wrist surgery, DM   Clinical Presentation Unstable   Clinical Presentation due to: pain  with weakness    Clinical Decision Making Moderate   Rehab Potential Good   PT Frequency 2x / week   PT Duration 8 weeks   PT Treatment/Interventions Cryotherapy;Electrical Stimulation;Iontophoresis 4mg /ml Dexamethasone;Moist Heat;Ultrasound;Passive range of motion;Patient/family education;Manual techniques;Taping;Dry needling;Therapeutic exercise   PT Next Visit Plan modalities  as needed ,   posture ed, stretching and gentle strength neck and shoulder   PT Home Exercise Plan posture  awareness, gnetle shoulder overhead and ER stretch   Consulted and Agree with Plan of Care Patient      Patient will benefit from skilled therapeutic intervention in order to improve the following deficits and impairments:  Pain, Postural dysfunction, Decreased strength, Decreased activity tolerance, Increased muscle spasms, Impaired UE functional use, Decreased range of motion  Visit Diagnosis: Rheumatoid arthritis, involving unspecified site, unspecified rheumatoid factor presence ([place]) - Plan: PT plan of care cert/re-cert  Cervical arthritis ([place]) - Plan: PT plan of care cert/re-cert  Chronic pain of both shoulders - Plan: PT plan of care cert/re-cert  Muscle weakness (generalized) - Plan: PT plan of care cert/re-cert  Abnormal posture - Plan: PT plan of care cert/re-cert  Cramp and spasm - Plan: PT plan of care cert/re-cert  Cervicalgia - Plan: PT plan of care cert/re-cert      G-Codes - 62/22/97 9892    Functional Assessment Tool Used (Outpatient Only) FOTO 49% limited   Functional Limitation Carrying, moving and handling objects   Carrying, Moving and Handling Objects Current Status (J1941) At least 40 percent but less than 60 percent impaired, limited or restricted   Carrying, Moving and Handling Objects Goal Status (D4081) At least 20  percent but less than 40 percent impaired, limited or restricted       Problem List Patient Active Problem List   Diagnosis Date Noted  . Elevated C-reactive protein (CRP)   . Elevated erythrocyte sedimentation rate   . Leukocytosis   . Hip joint effusion 01/20/2016  . Right hip pain 01/19/2016  . Hip pain 01/19/2016  . Congestive heart failure (CHF) ([place]) 12/01/2015  . Anemia 12/01/2015  . CHF (congestive heart failure) ([place]) 12/01/2015  . Acute diastolic CHF (congestive heart failure) ([place])   . Right rotator cuff tear 05/20/2014  . Nontraumatic tear of right rotator cuff   . Impingement syndrome of right shoulder   .  Thalassemia 02/28/2014  . On prednisone therapy 02/28/2014  . Rheumatoid arthritis ([place]) 02/28/2014  . Morbid obesity ([place]) 02/28/2014  . Shortness of breath 02/28/2014  . Renal insufficiency 02/28/2014  . Edema 02/28/2014  . Back pain 02/28/2014  . Essential hypertension 02/28/2014  . Diabetes ([place]) 02/28/2014  . Dyslipidemia 02/28/2014  . Obstructive sleep apnea 02/28/2014  . Acute on chronic diastolic congestive heart failure, NYHA class 2 ([place]) 02/28/2014  . Chest pain 02/27/2014  . Iron deficiency anemia 02/16/2014  . Post-menopausal bleeding 08/28/2012    Darrel Hoover PT 12/10/2016, 10:49 AM  [facility] [address] [place], [place], [address] Phone: [phone]   Fax:  [phone]  Name: Jessica Howard MRN: [id number] Date of Birth: 08/17/1960

## 2016-12-10 NOTE — Patient Instructions (Signed)
From cabinet issued stretching with wall slides, doorway stretch 3x/day 2-3 reps 30-60 sec. Also scapula retraction 3-4x/day 2-3 reps hold 5 sec, posture of neck with sitting

## 2016-12-12 DIAGNOSIS — L309 Dermatitis, unspecified: Secondary | ICD-10-CM | POA: Diagnosis not present

## 2016-12-16 ENCOUNTER — Ambulatory Visit: Payer: Medicare Other | Admitting: Physical Therapy

## 2016-12-18 ENCOUNTER — Encounter: Payer: Self-pay | Admitting: Physical Therapy

## 2016-12-18 ENCOUNTER — Ambulatory Visit: Payer: Medicare Other | Admitting: Physical Therapy

## 2016-12-18 DIAGNOSIS — M6281 Muscle weakness (generalized): Secondary | ICD-10-CM | POA: Diagnosis not present

## 2016-12-18 DIAGNOSIS — R293 Abnormal posture: Secondary | ICD-10-CM

## 2016-12-18 DIAGNOSIS — G8929 Other chronic pain: Secondary | ICD-10-CM

## 2016-12-18 DIAGNOSIS — M069 Rheumatoid arthritis, unspecified: Secondary | ICD-10-CM

## 2016-12-18 DIAGNOSIS — M25511 Pain in right shoulder: Secondary | ICD-10-CM

## 2016-12-18 DIAGNOSIS — M4692 Unspecified inflammatory spondylopathy, cervical region: Secondary | ICD-10-CM | POA: Diagnosis not present

## 2016-12-18 DIAGNOSIS — M542 Cervicalgia: Secondary | ICD-10-CM

## 2016-12-18 DIAGNOSIS — M47812 Spondylosis without myelopathy or radiculopathy, cervical region: Secondary | ICD-10-CM

## 2016-12-18 DIAGNOSIS — M25512 Pain in left shoulder: Secondary | ICD-10-CM

## 2016-12-18 DIAGNOSIS — R252 Cramp and spasm: Secondary | ICD-10-CM

## 2016-12-18 NOTE — Therapy (Signed)
[place], [place], [address] Phone: [phone]   Fax:  [phone]  Physical Therapy Treatment  Patient Details  Name: Jessica Howard MRN: [id number] Date of Birth: 1960-08-28 Referring Provider: Elsie Saas, MD  Encounter Date: 12/18/2016      PT End of Session - 12/18/16 1810    Visit Number 2   Number of Visits 16   Date for PT Re-Evaluation 01/31/17   PT Start Time 6010  Late to a 30 minute session   PT Stop Time 0818   PT Time Calculation (min) 43 min   Activity Tolerance Patient tolerated treatment well   Behavior During Therapy [facility] for tasks assessed/performed      Past Medical History:  Diagnosis Date  . Asthma   . Diabetes mellitus    type 2  . Diabetic neuropathy ([place])   . Endometrial polyp   . Fibroadenoma of breast    left  . GERD (gastroesophageal reflux disease)   . Headache    migraines  . Hepatic steatosis   . Hyperlipidemia   . Hypertension   . Impingement syndrome of right shoulder   . Nontraumatic tear of right rotator cuff   . Pneumonia   . Psoriasis   . Rheumatoid arthritis(714.0)    rhematoid and osteoarthritis  . Sleep apnea   . Thyroid disease    had radiation  . Unspecified deficiency anemia    Thalassemia    Past Surgical History:  Procedure Laterality Date  . ANKLE ARTHROSCOPY Right   . CESAREAN SECTION    . COLONOSCOPY    . DILATION AND CURETTAGE OF UTERUS    . EYE SURGERY     surgery on retina  . HAND SURGERY     for arthritis  . left knee surgery nodule removal    . OTHER SURGICAL HISTORY     multiple nodule removal on neck, hands, left knee  . SHOULDER ARTHROSCOPY WITH ROTATOR CUFF REPAIR AND SUBACROMIAL DECOMPRESSION Right 05/20/2014   Procedure: RIGHT SHOULDER ARTHROSCOPY WITH DEBRIDEMENT/DISTAL CLAVICLE EXCISION/ROTATOR CUFF REPAIR AND SUBACROMIAL DECOMPRESSION;  Surgeon: Lorn Junes, MD;  Location: [facility];  Service: Orthopedics;   Laterality: Right;  . TONSILLECTOMY    . TUBAL LIGATION     bilateral    There were no vitals filed for this visit.      Subjective Assessment - 12/18/16 0737    Subjective She has been able to do her exercises and working on her exercise.   Currently in Pain? Yes   Pain Score 6    Pain Location Shoulder   Pain Orientation Right;Left   Pain Descriptors / Indicators Aching   Aggravating Factors  reaching lifting   Pain Relieving Factors rest   Effect of Pain on Daily Activities sleeping difficult.  ADL limited                         [facility] Adult PT Treatment/Exercise - 12/18/16 0001      Moist Heat Therapy   Number Minutes Moist Heat 15 Minutes   Moist Heat Location Shoulder;Cervical  sitting     Ultrasound   Ultrasound Location shoulder   Ultrasound Parameters 1.5 watts/cm2, 8 minutes   Ultrasound Goals Pain     Manual Therapy   Manual Therapy Soft tissue mobilization   Manual therapy comments tissue softened,  Gentle myofascial release and stretching  PT Short Term Goals - 12/10/16 0951      PT SHORT TERM GOAL #1   Title She will be independent with inital HEP   Time 4   Period Weeks   Status New     PT SHORT TERM GOAL #2   Title she will report neck pain decreased 30-40% or more    Time 4   Period Weeks   Status New     PT SHORT TERM GOAL #3   Title She will improve  active overhead reach to full available ROM   Time 4   Period Weeks   Status New     PT SHORT TERM GOAL #4   Title she will improve  cervical rotation to 50 degrees bilaterally    Time 4   Period Weeks   Status New           PT Long Term Goals - 12/10/16 3646      PT LONG TERM GOAL #1   Title she will be independent with all HEP issued   Time 8   Period Weeks   Status New     PT LONG TERM GOAL #2   Title She will improve use of arms to be able to  each into cabinets overhead    Time 8   Period Weeks   Status New     PT LONG  TERM GOAL #3   Title She will report pain decreased 60% or more with using arms for slfcare and household tasks    Time 8   Period Weeks   Status New     PT LONG TERM GOAL #4   Title She will be able to demo understanding of good posture   Time 8   Period Weeks   Status New     PT LONG TERM GOAL #5   Title Cervical pain decreased 60% and become intermitttant    Time 8   Period Weeks   Status New               Plan - 12/18/16 1811    Clinical Impression Statement Pain reduced with today's session.  No new objective findings noted,  except patient was able to improve posture with cues and lumbar support.   PT Treatment/Interventions Cryotherapy;Electrical Stimulation;Iontophoresis 4mg /ml Dexamethasone;Moist Heat;Ultrasound;Passive range of motion;Patient/family education;Manual techniques;Taping;Dry needling;Therapeutic exercise   PT Next Visit Plan modalities  as needed ,   posture ed, stretching and gentle strength neck and shoulder   PT Home Exercise Plan posture awareness, gnetle shoulder overhead and ER stretch   Consulted and Agree with Plan of Care Patient      Patient will benefit from skilled therapeutic intervention in order to improve the following deficits and impairments:  Pain, Postural dysfunction, Decreased strength, Decreased activity tolerance, Increased muscle spasms, Impaired UE functional use, Decreased range of motion  Visit Diagnosis: Rheumatoid arthritis, involving unspecified site, unspecified rheumatoid factor presence (HCC)  Cervical arthritis (HCC)  Chronic pain of both shoulders  Muscle weakness (generalized)  Abnormal posture  Cramp and spasm  Cervicalgia     Problem List Patient Active Problem List   Diagnosis Date Noted  . Elevated C-reactive protein (CRP)   . Elevated erythrocyte sedimentation rate   . Leukocytosis   . Hip joint effusion 01/20/2016  . Right hip pain 01/19/2016  . Hip pain 01/19/2016  . Congestive heart  failure (CHF) ([place]) 12/01/2015  . Anemia 12/01/2015  . CHF (congestive heart failure) ([place]) 12/01/2015  .  Acute diastolic CHF (congestive heart failure) ([place])   . Right rotator cuff tear 05/20/2014  . Nontraumatic tear of right rotator cuff   . Impingement syndrome of right shoulder   . Thalassemia 02/28/2014  . On prednisone therapy 02/28/2014  . Rheumatoid arthritis ([place]) 02/28/2014  . Morbid obesity ([place]) 02/28/2014  . Shortness of breath 02/28/2014  . Renal insufficiency 02/28/2014  . Edema 02/28/2014  . Back pain 02/28/2014  . Essential hypertension 02/28/2014  . Diabetes ([place]) 02/28/2014  . Dyslipidemia 02/28/2014  . Obstructive sleep apnea 02/28/2014  . Acute on chronic diastolic congestive heart failure, NYHA class 2 ([place]) 02/28/2014  . Chest pain 02/27/2014  . Iron deficiency anemia 02/16/2014  . Post-menopausal bleeding 08/28/2012    Mykaila Blunck PTA 12/18/2016, 6:16 PM  [facility] [address] [place], [place], [address] Phone: [phone]   Fax:  [phone]  Name: Jessica Howard MRN: [id number] Date of Birth: 1961/04/13

## 2016-12-19 DIAGNOSIS — M79606 Pain in leg, unspecified: Secondary | ICD-10-CM | POA: Diagnosis not present

## 2016-12-19 DIAGNOSIS — E559 Vitamin D deficiency, unspecified: Secondary | ICD-10-CM | POA: Diagnosis not present

## 2016-12-19 DIAGNOSIS — R5383 Other fatigue: Secondary | ICD-10-CM | POA: Diagnosis not present

## 2016-12-20 DIAGNOSIS — I10 Essential (primary) hypertension: Secondary | ICD-10-CM | POA: Diagnosis not present

## 2016-12-20 DIAGNOSIS — Z79899 Other long term (current) drug therapy: Secondary | ICD-10-CM | POA: Diagnosis not present

## 2016-12-20 DIAGNOSIS — G8929 Other chronic pain: Secondary | ICD-10-CM | POA: Diagnosis not present

## 2016-12-20 DIAGNOSIS — K5903 Drug induced constipation: Secondary | ICD-10-CM | POA: Diagnosis not present

## 2016-12-20 DIAGNOSIS — M5441 Lumbago with sciatica, right side: Secondary | ICD-10-CM | POA: Diagnosis not present

## 2016-12-23 ENCOUNTER — Ambulatory Visit: Payer: Medicare Other

## 2016-12-23 DIAGNOSIS — M47812 Spondylosis without myelopathy or radiculopathy, cervical region: Secondary | ICD-10-CM

## 2016-12-23 DIAGNOSIS — M25511 Pain in right shoulder: Secondary | ICD-10-CM

## 2016-12-23 DIAGNOSIS — M542 Cervicalgia: Secondary | ICD-10-CM

## 2016-12-23 DIAGNOSIS — M4692 Unspecified inflammatory spondylopathy, cervical region: Secondary | ICD-10-CM | POA: Diagnosis not present

## 2016-12-23 DIAGNOSIS — M6281 Muscle weakness (generalized): Secondary | ICD-10-CM | POA: Diagnosis not present

## 2016-12-23 DIAGNOSIS — R293 Abnormal posture: Secondary | ICD-10-CM

## 2016-12-23 DIAGNOSIS — M25512 Pain in left shoulder: Secondary | ICD-10-CM | POA: Diagnosis not present

## 2016-12-23 DIAGNOSIS — M069 Rheumatoid arthritis, unspecified: Secondary | ICD-10-CM

## 2016-12-23 DIAGNOSIS — G8929 Other chronic pain: Secondary | ICD-10-CM

## 2016-12-23 DIAGNOSIS — R252 Cramp and spasm: Secondary | ICD-10-CM

## 2016-12-23 NOTE — Therapy (Signed)
[place], [place], [address] Phone: [phone]   Fax:  [phone]  Physical Therapy Treatment  Patient Details  Name: Jessica Howard MRN: [id number] Date of Birth: June 26, 1960 Referring Provider: Elsie Saas, MD  Encounter Date: 12/23/2016      PT End of Session - 12/23/16 0934    Visit Number 3   Number of Visits 16   Date for PT Re-Evaluation 01/31/17   Authorization Type MCR   PT Start Time 0932   PT Stop Time 1015   PT Time Calculation (min) 43 min   Activity Tolerance Patient tolerated treatment well;No increased pain   Behavior During Therapy WFL for tasks assessed/performed      Past Medical History:  Diagnosis Date  . Asthma   . Diabetes mellitus    type 2  . Diabetic neuropathy ([place])   . Endometrial polyp   . Fibroadenoma of breast    left  . GERD (gastroesophageal reflux disease)   . Headache    migraines  . Hepatic steatosis   . Hyperlipidemia   . Hypertension   . Impingement syndrome of right shoulder   . Nontraumatic tear of right rotator cuff   . Pneumonia   . Psoriasis   . Rheumatoid arthritis(714.0)    rhematoid and osteoarthritis  . Sleep apnea   . Thyroid disease    had radiation  . Unspecified deficiency anemia    Thalassemia    Past Surgical History:  Procedure Laterality Date  . ANKLE ARTHROSCOPY Right   . CESAREAN SECTION    . COLONOSCOPY    . DILATION AND CURETTAGE OF UTERUS    . EYE SURGERY     surgery on retina  . HAND SURGERY     for arthritis  . left knee surgery nodule removal    . OTHER SURGICAL HISTORY     multiple nodule removal on neck, hands, left knee  . SHOULDER ARTHROSCOPY WITH ROTATOR CUFF REPAIR AND SUBACROMIAL DECOMPRESSION Right 05/20/2014   Procedure: RIGHT SHOULDER ARTHROSCOPY WITH DEBRIDEMENT/DISTAL CLAVICLE EXCISION/ROTATOR CUFF REPAIR AND SUBACROMIAL DECOMPRESSION;  Surgeon: Lorn Junes, MD;  Location: [facility];  Service:  Orthopedics;  Laterality: Right;  . TONSILLECTOMY    . TUBAL LIGATION     bilateral    There were no vitals filed for this visit.      Subjective Assessment - 12/23/16 0939    Subjective No better.  She reports doing HEP without issue.    Currently in Pain? Yes   Pain Score 5    Pain Location Neck  and RT shoulder   Pain Orientation Right   Pain Descriptors / Indicators Aching   Pain Type Chronic pain   Pain Onset More than a month ago   Pain Frequency Constant   Aggravating Factors  reach , using arm   Pain Relieving Factors rest                         [facility] Adult PT Treatment/Exercise - 12/23/16 0001      Moist Heat Therapy   Number Minutes Moist Heat 12 Minutes   Moist Heat Location Shoulder;Cervical     Ultrasound   Ultrasound Location neck to RT trapa   Ultrasound Parameters 1.6 Wcm2 , 1 MHz      Manual Therapy   Manual therapy comments tissue softened,   myofascial release and stretching, using hands and tool . More tender  upper  medial angle RT scapula.  PROM RT shoulder without  limits or expression of pain.                   PT Short Term Goals - 12/23/16 1004      PT SHORT TERM GOAL #1   Title She will be independent with inital HEP   Status Achieved     PT SHORT TERM GOAL #2   Title she will report neck pain decreased 30-40% or more    Status On-going     PT SHORT TERM GOAL #3   Title She will improve  active overhead reach to full available ROM   Baseline pasive without expresion of pain   Status On-going     PT SHORT TERM GOAL #4   Title she will improve  cervical rotation to 50 degrees bilaterally    Status Unable to assess           PT Long Term Goals - 12/10/16 0952      PT LONG TERM GOAL #1   Title she will be independent with all HEP issued   Time 8   Period Weeks   Status New     PT LONG TERM GOAL #2   Title She will improve use of arms to be able to  each into cabinets overhead    Time 8    Period Weeks   Status New     PT LONG TERM GOAL #3   Title She will report pain decreased 60% or more with using arms for slfcare and household tasks    Time 8   Period Weeks   Status New     PT LONG TERM GOAL #4   Title She will be able to demo understanding of good posture   Time 8   Period Weeks   Status New     PT LONG TERM GOAL #5   Title Cervical pain decreased 60% and become intermitttant    Time 8   Period Weeks   Status New               Plan - 12/23/16 1002    Clinical Impression Statement Decr pain post session but only visit 3 and no overall improvement . Appears less pain with movement of RT shoulder.    PT Treatment/Interventions Cryotherapy;Electrical Stimulation;Iontophoresis 4mg /ml Dexamethasone;Moist Heat;Ultrasound;Passive range of motion;Patient/family education;Manual techniques;Taping;Dry needling;Therapeutic exercise   PT Next Visit Plan modalities  as needed ,   posture ed, stretching and gentle strength neck and shoulder, manual    PT Home Exercise Plan posture awareness, gentle shoulder overhead and ER stretch   Consulted and Agree with Plan of Care Patient      Patient will benefit from skilled therapeutic intervention in order to improve the following deficits and impairments:  Pain, Postural dysfunction, Decreased strength, Decreased activity tolerance, Increased muscle spasms, Impaired UE functional use, Decreased range of motion  Visit Diagnosis: Rheumatoid arthritis, involving unspecified site, unspecified rheumatoid factor presence (HCC)  Cervical arthritis (HCC)  Chronic pain of both shoulders  Muscle weakness (generalized)  Abnormal posture  Cervicalgia  Cramp and spasm     Problem List Patient Active Problem List   Diagnosis Date Noted  . Elevated C-reactive protein (CRP)   . Elevated erythrocyte sedimentation rate   . Leukocytosis   . Hip joint effusion 01/20/2016  . Right hip pain 01/19/2016  . Hip pain  01/19/2016  . Congestive heart failure (CHF) ([place]) 12/01/2015  .  Anemia 12/01/2015  . CHF (congestive heart failure) ([place]) 12/01/2015  . Acute diastolic CHF (congestive heart failure) ([place])   . Right rotator cuff tear 05/20/2014  . Nontraumatic tear of right rotator cuff   . Impingement syndrome of right shoulder   . Thalassemia 02/28/2014  . On prednisone therapy 02/28/2014  . Rheumatoid arthritis ([place]) 02/28/2014  . Morbid obesity ([place]) 02/28/2014  . Shortness of breath 02/28/2014  . Renal insufficiency 02/28/2014  . Edema 02/28/2014  . Back pain 02/28/2014  . Essential hypertension 02/28/2014  . Diabetes ([place]) 02/28/2014  . Dyslipidemia 02/28/2014  . Obstructive sleep apnea 02/28/2014  . Acute on chronic diastolic congestive heart failure, NYHA class 2 ([place]) 02/28/2014  . Chest pain 02/27/2014  . Iron deficiency anemia 02/16/2014  . Post-menopausal bleeding 08/28/2012    Darrel Hoover   PT 12/23/2016, 10:07 AM  [facility] [address] [place], [place], [address] Phone: [phone]   Fax:  [phone]  Name: Jessica Howard MRN: [id number] Date of Birth: 09/07/1960

## 2016-12-25 ENCOUNTER — Ambulatory Visit: Payer: Medicare Other

## 2016-12-25 ENCOUNTER — Encounter: Payer: Self-pay | Admitting: Physical Therapy

## 2016-12-25 ENCOUNTER — Ambulatory Visit: Payer: Medicare Other | Admitting: Physical Therapy

## 2016-12-25 DIAGNOSIS — M47812 Spondylosis without myelopathy or radiculopathy, cervical region: Secondary | ICD-10-CM

## 2016-12-25 DIAGNOSIS — G8929 Other chronic pain: Secondary | ICD-10-CM

## 2016-12-25 DIAGNOSIS — M6281 Muscle weakness (generalized): Secondary | ICD-10-CM | POA: Diagnosis not present

## 2016-12-25 DIAGNOSIS — M069 Rheumatoid arthritis, unspecified: Secondary | ICD-10-CM | POA: Diagnosis not present

## 2016-12-25 DIAGNOSIS — M25511 Pain in right shoulder: Secondary | ICD-10-CM | POA: Diagnosis not present

## 2016-12-25 DIAGNOSIS — R252 Cramp and spasm: Secondary | ICD-10-CM

## 2016-12-25 DIAGNOSIS — M25512 Pain in left shoulder: Secondary | ICD-10-CM | POA: Diagnosis not present

## 2016-12-25 DIAGNOSIS — M542 Cervicalgia: Secondary | ICD-10-CM

## 2016-12-25 DIAGNOSIS — R293 Abnormal posture: Secondary | ICD-10-CM

## 2016-12-25 DIAGNOSIS — M4692 Unspecified inflammatory spondylopathy, cervical region: Secondary | ICD-10-CM | POA: Diagnosis not present

## 2016-12-25 NOTE — Therapy (Signed)
[place], [place], [address] Phone: [phone]   Fax:  [phone]  Physical Therapy Treatment  Patient Details  Name: Jessica Howard MRN: [id number] Date of Birth: 1961-03-13 Referring Provider: Elsie Saas, MD  Encounter Date: 12/25/2016      PT End of Session - 12/25/16 1217    Visit Number 4   Number of Visits 16   Date for PT Re-Evaluation 01/31/17   PT Start Time 1017   PT Stop Time 1110   PT Time Calculation (min) 53 min   Activity Tolerance Patient tolerated treatment well   Behavior During Therapy [facility] for tasks assessed/performed      Past Medical History:  Diagnosis Date  . Asthma   . Diabetes mellitus    type 2  . Diabetic neuropathy ([place])   . Endometrial polyp   . Fibroadenoma of breast    left  . GERD (gastroesophageal reflux disease)   . Headache    migraines  . Hepatic steatosis   . Hyperlipidemia   . Hypertension   . Impingement syndrome of right shoulder   . Nontraumatic tear of right rotator cuff   . Pneumonia   . Psoriasis   . Rheumatoid arthritis(714.0)    rhematoid and osteoarthritis  . Sleep apnea   . Thyroid disease    had radiation  . Unspecified deficiency anemia    Thalassemia    Past Surgical History:  Procedure Laterality Date  . ANKLE ARTHROSCOPY Right   . CESAREAN SECTION    . COLONOSCOPY    . DILATION AND CURETTAGE OF UTERUS    . EYE SURGERY     surgery on retina  . HAND SURGERY     for arthritis  . left knee surgery nodule removal    . OTHER SURGICAL HISTORY     multiple nodule removal on neck, hands, left knee  . SHOULDER ARTHROSCOPY WITH ROTATOR CUFF REPAIR AND SUBACROMIAL DECOMPRESSION Right 05/20/2014   Procedure: RIGHT SHOULDER ARTHROSCOPY WITH DEBRIDEMENT/DISTAL CLAVICLE EXCISION/ROTATOR CUFF REPAIR AND SUBACROMIAL DECOMPRESSION;  Surgeon: Lorn Junes, MD;  Location: [place];  Service: Orthopedics;  Laterality: Right;  .  TONSILLECTOMY    . TUBAL LIGATION     bilateral    There were no vitals filed for this visit.      Subjective Assessment - 12/25/16 1023    Subjective (P)  5/10  She is doing her exercies.  working on posture,   Currently in Pain? (P)  Yes   Pain Score (P)  5    Pain Location (P)  Neck   Pain Orientation (P)  Right;Left  left toothach  4/10,  right 7/10 shoulder    worse with moving, lying down   Pain Descriptors / Indicators (P)  Aching   Pain Type (P)  Chronic pain   Pain Frequency (P)  Constant                         [facility] Adult PT Treatment/Exercise - 12/25/16 0001      Self-Care   Self-Care ADL's;Posture   ADL's Arthritis handbook issued  energy conservation,  adaptations possible   Posture Sitting and bed posture supine, sidelying and prone education/ practice     Neck Exercises: Seated   Neck Retraction 5 reps   Neck Retraction Limitations cues needed   Shoulder Shrugs Limitations reviewed   Shoulder Rolls Limitations reviewed   Other Seated Exercise elbow circles  verball reviewed ans were ER then elbow adduction with hands on back of head.       Shoulder Exercises: Seated   Retraction 5 reps     Ultrasound   Ultrasound Location neck to trap/ shoulder right.  Left shoulder  1.6 watts/cm2,  8 minutes each,  1 Mhz    Ultrasound Parameters 1.6W/cm2   Ultrasound Goals Pain                PT Education - 12/25/16 1216    Education provided Yes   Education Details Self care booklet for arthritis   Person(s) Educated Patient   Methods Explanation;Demonstration;Handout;Verbal cues   Comprehension Verbalized understanding;Returned demonstration;Need further instruction          PT Short Term Goals - 12/23/16 1004      PT SHORT TERM GOAL #1   Title She will be independent with inital HEP   Status Achieved     PT SHORT TERM GOAL #2   Title she will report neck pain decreased 30-40% or more    Status On-going     PT SHORT TERM GOAL  #3   Title She will improve  active overhead reach to full available ROM   Baseline pasive without expresion of pain   Status On-going     PT SHORT TERM GOAL #4   Title she will improve  cervical rotation to 50 degrees bilaterally    Status Unable to assess           PT Long Term Goals - 12/10/16 0952      PT LONG TERM GOAL #1   Title she will be independent with all HEP issued   Time 8   Period Weeks   Status New     PT LONG TERM GOAL #2   Title She will improve use of arms to be able to  each into cabinets overhead    Time 8   Period Weeks   Status New     PT LONG TERM GOAL #3   Title She will report pain decreased 60% or more with using arms for slfcare and household tasks    Time 8   Period Weeks   Status New     PT LONG TERM GOAL #4   Title She will be able to demo understanding of good posture   Time 8   Period Weeks   Status New     PT LONG TERM GOAL #5   Title Cervical pain decreased 60% and become intermitttant    Time 8   Period Weeks   Status New               Plan - 12/25/16 1217    Clinical Impression Statement Patient has been sleeping and watching TV in bed with her head rotated and hyperextended.  With practice today she was able to be positioned prone with pillows and have her upper traps completely relaxed. Arthritis handout may also give her some Ideas for joint protection that will help her pain. [place] toward her posture goals.  She has less pain post session.  "I feel better"  Neck ROM lateral flexion to left WNL.  Right limited about 25%    PT Treatment/Interventions Cryotherapy;Electrical Stimulation;Iontophoresis 4mg /ml Dexamethasone;Moist Heat;Ultrasound;Passive range of motion;Patient/family education;Manual techniques;Taping;Dry needling;Therapeutic exercise   PT Next Visit Plan modalities  as needed ,   posture ed, stretching and gentle strength neck and shoulder, manual answer and Questions about handbook..  consider going  through handout exercises.   PT Home Exercise Plan posture awareness, gentle shoulder overhead and ER stretch   Consulted and Agree with Plan of Care Patient      Patient will benefit from skilled therapeutic intervention in order to improve the following deficits and impairments:  Pain, Postural dysfunction, Decreased strength, Decreased activity tolerance, Increased muscle spasms, Impaired UE functional use, Decreased range of motion  Visit Diagnosis: Rheumatoid arthritis, involving unspecified site, unspecified rheumatoid factor presence (HCC)  Cervical arthritis (HCC)  Chronic pain of both shoulders  Muscle weakness (generalized)  Abnormal posture  Cervicalgia  Cramp and spasm     Problem List Patient Active Problem List   Diagnosis Date Noted  . Elevated C-reactive protein (CRP)   . Elevated erythrocyte sedimentation rate   . Leukocytosis   . Hip joint effusion 01/20/2016  . Right hip pain 01/19/2016  . Hip pain 01/19/2016  . Congestive heart failure (CHF) ([place]) 12/01/2015  . Anemia 12/01/2015  . CHF (congestive heart failure) ([place]) 12/01/2015  . Acute diastolic CHF (congestive heart failure) ([place])   . Right rotator cuff tear 05/20/2014  . Nontraumatic tear of right rotator cuff   . Impingement syndrome of right shoulder   . Thalassemia 02/28/2014  . On prednisone therapy 02/28/2014  . Rheumatoid arthritis ([place]) 02/28/2014  . Morbid obesity ([place]) 02/28/2014  . Shortness of breath 02/28/2014  . Renal insufficiency 02/28/2014  . Edema 02/28/2014  . Back pain 02/28/2014  . Essential hypertension 02/28/2014  . Diabetes ([place]) 02/28/2014  . Dyslipidemia 02/28/2014  . Obstructive sleep apnea 02/28/2014  . Acute on chronic diastolic congestive heart failure, NYHA class 2 ([place]) 02/28/2014  . Chest pain 02/27/2014  . Iron deficiency anemia 02/16/2014  . Post-menopausal bleeding 08/28/2012    HARRIS,KAREN PTA 12/25/2016, 12:24 PM  [facility] [address] [place], [place], [address] Phone: [phone]   Fax:  [phone]  Name: Jessica Howard MRN: [id number] Date of Birth: [birth year]

## 2016-12-25 NOTE — Patient Instructions (Signed)
Issued arthritis booklet from exercise drawer

## 2016-12-30 ENCOUNTER — Ambulatory Visit: Payer: Medicare Other

## 2016-12-30 DIAGNOSIS — M47812 Spondylosis without myelopathy or radiculopathy, cervical region: Secondary | ICD-10-CM

## 2016-12-30 DIAGNOSIS — G8929 Other chronic pain: Secondary | ICD-10-CM

## 2016-12-30 DIAGNOSIS — M25512 Pain in left shoulder: Secondary | ICD-10-CM

## 2016-12-30 DIAGNOSIS — M6281 Muscle weakness (generalized): Secondary | ICD-10-CM | POA: Diagnosis not present

## 2016-12-30 DIAGNOSIS — R252 Cramp and spasm: Secondary | ICD-10-CM

## 2016-12-30 DIAGNOSIS — M25511 Pain in right shoulder: Secondary | ICD-10-CM | POA: Diagnosis not present

## 2016-12-30 DIAGNOSIS — M069 Rheumatoid arthritis, unspecified: Secondary | ICD-10-CM

## 2016-12-30 DIAGNOSIS — M542 Cervicalgia: Secondary | ICD-10-CM

## 2016-12-30 DIAGNOSIS — M4692 Unspecified inflammatory spondylopathy, cervical region: Secondary | ICD-10-CM | POA: Diagnosis not present

## 2016-12-30 DIAGNOSIS — R293 Abnormal posture: Secondary | ICD-10-CM

## 2016-12-30 DIAGNOSIS — L309 Dermatitis, unspecified: Secondary | ICD-10-CM | POA: Diagnosis not present

## 2016-12-30 NOTE — Therapy (Signed)
[place], [place], [address] Phone: [phone]   Fax:  [phone]  Physical Therapy Treatment  Patient Details  Name: Jessica Howard MRN: [id number] Date of Birth: February 23, 1961 Referring Provider: Elsie Saas, MD  Encounter Date: 12/30/2016    Past Medical History:  Diagnosis Date  . Asthma   . Diabetes mellitus    type 2  . Diabetic neuropathy ([place])   . Endometrial polyp   . Fibroadenoma of breast    left  . GERD (gastroesophageal reflux disease)   . Headache    migraines  . Hepatic steatosis   . Hyperlipidemia   . Hypertension   . Impingement syndrome of right shoulder   . Nontraumatic tear of right rotator cuff   . Pneumonia   . Psoriasis   . Rheumatoid arthritis(714.0)    rhematoid and osteoarthritis  . Sleep apnea   . Thyroid disease    had radiation  . Unspecified deficiency anemia    Thalassemia    Past Surgical History:  Procedure Laterality Date  . ANKLE ARTHROSCOPY Right   . CESAREAN SECTION    . COLONOSCOPY    . DILATION AND CURETTAGE OF UTERUS    . EYE SURGERY     surgery on retina  . HAND SURGERY     for arthritis  . left knee surgery nodule removal    . OTHER SURGICAL HISTORY     multiple nodule removal on neck, hands, left knee  . SHOULDER ARTHROSCOPY WITH ROTATOR CUFF REPAIR AND SUBACROMIAL DECOMPRESSION Right 05/20/2014   Procedure: RIGHT SHOULDER ARTHROSCOPY WITH DEBRIDEMENT/DISTAL CLAVICLE EXCISION/ROTATOR CUFF REPAIR AND SUBACROMIAL DECOMPRESSION;  Surgeon: Lorn Junes, MD;  Location: [address];  Service: Orthopedics;  Laterality: Right;  . TONSILLECTOMY    . TUBAL LIGATION     bilateral    There were no vitals filed for this visit.      Subjective Assessment - 12/30/16 0848    Subjective She reports pain 4/10.     Overall pain improved 50% maybe   Currently in Pain? Yes   Pain Score 5    Pain Location Neck  and shoulder   Pain Orientation  Right;Left   Pain Descriptors / Indicators Aching   Pain Type Chronic pain   Pain Onset More than a month ago   Pain Frequency Constant   Aggravating Factors  using Rt arm    Pain Relieving Factors rest, meds sometimes   Multiple Pain Sites No            [facility] PT Assessment - 12/30/16 0001      AROM   Cervical - Right Rotation 60   Cervical - Left Rotation 60                     [facility] Adult PT Treatment/Exercise - 12/30/16 0001      Neck Exercises: Seated   Neck Retraction 5 reps;5 secs     Moist Heat Therapy   Number Minutes Moist Heat 12 Minutes   Moist Heat Location Shoulder;Cervical     Ultrasound   Ultrasound Location neck and trap   Ultrasound Parameters 1.4 Wcm2, 1MH   Ultrasound Goals Pain     Manual Therapy   Manual therapy comments tissue softened,   myofascial release and stretching, using hands and tool . More tender  upper medial angle RT scapula.  PT Short Term Goals - 12/30/16 4818      PT SHORT TERM GOAL #1   Title She will be independent with inital HEP   Status Achieved     PT SHORT TERM GOAL #2   Title she will report neck pain decreased 30-40% or more    Baseline per patient   Status Achieved     PT SHORT TERM GOAL #3   Title She will improve  active overhead reach to full available ROM   Status Achieved     PT SHORT TERM GOAL #4   Title she will improve  cervical rotation to 50 degrees bilaterally    Baseline 60 degres bilaterally   Status Achieved           PT Long Term Goals - 12/30/16 0854      PT LONG TERM GOAL #1   Title she will be independent with all HEP issued   Status On-going     PT LONG TERM GOAL #2   Title She will improve use of arms to be able to  each into cabinets overhead    Status On-going     PT LONG TERM GOAL #3   Title She will report pain decreased 60% or more with using arms for slfcare and household tasks    Status On-going     PT LONG TERM GOAL #4    Title She will be able to demo understanding of good posture   Status On-going     PT LONG TERM GOAL #5   Title Cervical pain decreased 60% and become intermitttant    Status On-going             Patient will benefit from skilled therapeutic intervention in order to improve the following deficits and impairments:     Visit Diagnosis: Rheumatoid arthritis, involving unspecified site, unspecified rheumatoid factor presence (HCC)  Cervical arthritis (HCC)  Chronic pain of both shoulders  Muscle weakness (generalized)  Abnormal posture  Cervicalgia  Cramp and spasm     Problem List Patient Active Problem List   Diagnosis Date Noted  . Elevated C-reactive protein (CRP)   . Elevated erythrocyte sedimentation rate   . Leukocytosis   . Hip joint effusion 01/20/2016  . Right hip pain 01/19/2016  . Hip pain 01/19/2016  . Congestive heart failure (CHF) ([place]) 12/01/2015  . Anemia 12/01/2015  . CHF (congestive heart failure) ([place]) 12/01/2015  . Acute diastolic CHF (congestive heart failure) ([place])   . Right rotator cuff tear 05/20/2014  . Nontraumatic tear of right rotator cuff   . Impingement syndrome of right shoulder   . Thalassemia 02/28/2014  . On prednisone therapy 02/28/2014  . Rheumatoid arthritis ([place]) 02/28/2014  . Morbid obesity ([place]) 02/28/2014  . Shortness of breath 02/28/2014  . Renal insufficiency 02/28/2014  . Edema 02/28/2014  . Back pain 02/28/2014  . Essential hypertension 02/28/2014  . Diabetes ([place]) 02/28/2014  . Dyslipidemia 02/28/2014  . Obstructive sleep apnea 02/28/2014  . Acute on chronic diastolic congestive heart failure, NYHA class 2 ([place]) 02/28/2014  . Chest pain 02/27/2014  . Iron deficiency anemia 02/16/2014  . Post-menopausal bleeding 08/28/2012    Darrel Hoover  PT 12/30/2016, 9:29 AM  [facility] [address] [place], [place], [address] Phone: [phone]   Fax:   [phone]  Name: Jessica Howard MRN: [id number] Date of Birth: 03/13/1961

## 2017-01-01 ENCOUNTER — Encounter (HOSPITAL_COMMUNITY)
Admission: RE | Admit: 2017-01-01 | Discharge: 2017-01-01 | Disposition: A | Discharge status: Home / Self Care | Payer: Medicare Other | Source: Ambulatory Visit | Attending: Rheumatology | Admitting: Rheumatology

## 2017-01-01 DIAGNOSIS — D509 Iron deficiency anemia, unspecified: Secondary | ICD-10-CM | POA: Insufficient documentation

## 2017-01-01 DIAGNOSIS — N289 Disorder of kidney and ureter, unspecified: Secondary | ICD-10-CM | POA: Diagnosis not present

## 2017-01-01 DIAGNOSIS — M069 Rheumatoid arthritis, unspecified: Secondary | ICD-10-CM | POA: Insufficient documentation

## 2017-01-01 DIAGNOSIS — D649 Anemia, unspecified: Secondary | ICD-10-CM | POA: Diagnosis present

## 2017-01-01 MED ORDER — SODIUM CHLORIDE 0.9 % IV SOLN
1000.0000 mg | INTRAVENOUS | Status: DC
  Administered 2017-01-01: 10:00:00 1000 mg via INTRAVENOUS
  Filled 2017-01-01: qty 40

## 2017-01-01 MED ORDER — SODIUM CHLORIDE 0.9 % IV SOLN
INTRAVENOUS | Status: DC
  Administered 2017-01-01: 10:00:00 via INTRAVENOUS

## 2017-01-02 ENCOUNTER — Ambulatory Visit: Payer: Medicare Other

## 2017-01-02 DIAGNOSIS — M4692 Unspecified inflammatory spondylopathy, cervical region: Secondary | ICD-10-CM | POA: Diagnosis not present

## 2017-01-02 DIAGNOSIS — M6281 Muscle weakness (generalized): Secondary | ICD-10-CM | POA: Diagnosis not present

## 2017-01-02 DIAGNOSIS — M47812 Spondylosis without myelopathy or radiculopathy, cervical region: Secondary | ICD-10-CM

## 2017-01-02 DIAGNOSIS — M25512 Pain in left shoulder: Secondary | ICD-10-CM

## 2017-01-02 DIAGNOSIS — G8929 Other chronic pain: Secondary | ICD-10-CM | POA: Diagnosis not present

## 2017-01-02 DIAGNOSIS — R252 Cramp and spasm: Secondary | ICD-10-CM

## 2017-01-02 DIAGNOSIS — M069 Rheumatoid arthritis, unspecified: Secondary | ICD-10-CM

## 2017-01-02 DIAGNOSIS — M25511 Pain in right shoulder: Secondary | ICD-10-CM

## 2017-01-02 DIAGNOSIS — R293 Abnormal posture: Secondary | ICD-10-CM

## 2017-01-02 NOTE — Therapy (Addendum)
[place], [place], [address] Phone: [phone]   Fax:  [phone]  Physical Therapy Treatment/Discharge  Patient Details  Name: Jessica Howard MRN: [id number] Date of Birth: 03-20-1961 Referring Provider: Elsie Saas, MD  Encounter Date: 01/02/2017      PT End of Session - 01/02/17 0928    Visit Number 5   Number of Visits 16   Date for PT Re-Evaluation 01/31/17   Authorization Type MCR   PT Start Time 0930   PT Stop Time 1020   PT Time Calculation (min) 50 min   Activity Tolerance Patient tolerated treatment well   Behavior During Therapy [facility] for tasks assessed/performed      Past Medical History:  Diagnosis Date  . Asthma   . Diabetes mellitus    type 2  . Diabetic neuropathy ([place])   . Endometrial polyp   . Fibroadenoma of breast    left  . GERD (gastroesophageal reflux disease)   . Headache    migraines  . Hepatic steatosis   . Hyperlipidemia   . Hypertension   . Impingement syndrome of right shoulder   . Nontraumatic tear of right rotator cuff   . Pneumonia   . Psoriasis   . Rheumatoid arthritis(714.0)    rhematoid and osteoarthritis  . Sleep apnea   . Thyroid disease    had radiation  . Unspecified deficiency anemia    Thalassemia    Past Surgical History:  Procedure Laterality Date  . ANKLE ARTHROSCOPY Right   . CESAREAN SECTION    . COLONOSCOPY    . DILATION AND CURETTAGE OF UTERUS    . EYE SURGERY     surgery on retina  . HAND SURGERY     for arthritis  . left knee surgery nodule removal    . OTHER SURGICAL HISTORY     multiple nodule removal on neck, hands, left knee  . SHOULDER ARTHROSCOPY WITH ROTATOR CUFF REPAIR AND SUBACROMIAL DECOMPRESSION Right 05/20/2014   Procedure: RIGHT SHOULDER ARTHROSCOPY WITH DEBRIDEMENT/DISTAL CLAVICLE EXCISION/ROTATOR CUFF REPAIR AND SUBACROMIAL DECOMPRESSION;  Surgeon: Lorn Junes, MD;  Location: [place];  Service:  Orthopedics;  Laterality: Right;  . TONSILLECTOMY    . TUBAL LIGATION     bilateral    There were no vitals filed for this visit.      Subjective Assessment - 01/02/17 0932    Subjective She reports 4/10 pain . bilaterally.   Will see MD next week   Currently in Pain? Yes   Pain Score 4    Pain Location Neck   Pain Orientation Right;Left   Pain Descriptors / Indicators Aching   Pain Type Chronic pain   Pain Onset More than a month ago   Pain Frequency Constant   Aggravating Factors  moving neck and arm   Pain Relieving Factors rest , meds                          [facility] Adult PT Treatment/Exercise - 01/02/17 0001      Moist Heat Therapy   Number Minutes Moist Heat 12 Minutes   Moist Heat Location Shoulder;Cervical     Ultrasound   Ultrasound Location neck and traps   Ultrasound Parameters 1.5Wcm2 1MHz 100%   Ultrasound Goals Pain     Manual Therapy   Manual Therapy Taping   Manual therapy comments tissue softened,   myofascial release and stretching, using hands and  tool . More tender  upper medial angle RT scapula.     Kinesiotex Inhibit Muscle     Kinesiotix   Inhibit Muscle  cervical paraspinals and traps /scalenes bilaterally                PT Education - 01/02/17 1008    Education provided Yes   Education Details management of kineseotape with removal in 3 days or if skin irritated. OK for getting tape wet   Person(s) Educated Patient   Methods Explanation   Comprehension Verbalized understanding          PT Short Term Goals - 01/02/17 0936      PT SHORT TERM GOAL #1   Title She will be independent with inital HEP   Status Achieved     PT SHORT TERM GOAL #2   Title she will report neck pain decreased 30-40% or more    Status Achieved     PT SHORT TERM GOAL #3   Title She will improve  active overhead reach to full available ROM   Baseline pasive without expresion of pain   Status Achieved     PT SHORT TERM GOAL #4    Title she will improve  cervical rotation to 50 degrees bilaterally    Baseline 60 degres bilaterally   Status Achieved           PT Long Term Goals - 12/30/16 0854      PT LONG TERM GOAL #1   Title she will be independent with all HEP issued   Status On-going     PT LONG TERM GOAL #2   Title She will improve use of arms to be able to  each into cabinets overhead    Status On-going     PT LONG TERM GOAL #3   Title She will report pain decreased 60% or more with using arms for slfcare and household tasks    Status On-going     PT LONG TERM GOAL #4   Title She will be able to demo understanding of good posture   Status On-going     PT LONG TERM GOAL #5   Title Cervical pain decreased 60% and become intermitttant    Status On-going               Plan - 01/02/17 4818    Clinical Impression Statement No significant chang e from last visit.  Tolerated all session without incr pain and may benefit from taping .   May add some light band exercises next visit   PT Treatment/Interventions Cryotherapy;Electrical Stimulation;Iontophoresis 31m/ml Dexamethasone;Moist Heat;Ultrasound;Passive range of motion;Patient/family education;Manual techniques;Taping;Dry needling;Therapeutic exercise   PT Next Visit Plan modalities  as needed ,   posture ed, stretching and gentle strength neck and shoulder,  add ligth band exercisse if tolerated   PT Home Exercise Plan posture awareness, gentle shoulder overhead and ER stretch   Consulted and Agree with Plan of Care Patient      Patient will benefit from skilled therapeutic intervention in order to improve the following deficits and impairments:  Pain, Postural dysfunction, Decreased strength, Decreased activity tolerance, Increased muscle spasms, Impaired UE functional use, Decreased range of motion  Visit Diagnosis: Rheumatoid arthritis, involving unspecified site, unspecified rheumatoid factor presence (HCC)  Cervical arthritis  (HCC)  Chronic pain of both shoulders  Muscle weakness (generalized)  Abnormal posture  Cramp and spasm     Problem List Patient Active Problem List   Diagnosis Date Noted  .  Elevated C-reactive protein (CRP)   . Elevated erythrocyte sedimentation rate   . Leukocytosis   . Hip joint effusion 01/20/2016  . Right hip pain 01/19/2016  . Hip pain 01/19/2016  . Congestive heart failure (CHF) ([place]) 12/01/2015  . Anemia 12/01/2015  . CHF (congestive heart failure) ([place]) 12/01/2015  . Acute diastolic CHF (congestive heart failure) ([place])   . Right rotator cuff tear 05/20/2014  . Nontraumatic tear of right rotator cuff   . Impingement syndrome of right shoulder   . Thalassemia 02/28/2014  . On prednisone therapy 02/28/2014  . Rheumatoid arthritis ([place]) 02/28/2014  . Morbid obesity ([place]) 02/28/2014  . Shortness of breath 02/28/2014  . Renal insufficiency 02/28/2014  . Edema 02/28/2014  . Back pain 02/28/2014  . Essential hypertension 02/28/2014  . Diabetes ([place]) 02/28/2014  . Dyslipidemia 02/28/2014  . Obstructive sleep apnea 02/28/2014  . Acute on chronic diastolic congestive heart failure, NYHA class 2 ([place]) 02/28/2014  . Chest pain 02/27/2014  . Iron deficiency anemia 02/16/2014  . Post-menopausal bleeding 08/28/2012    Darrel Hoover  PT 01/02/2017, 10:12 AM  [facility] [address] [place], [place], [address] Phone: [phone]   Fax:  [phone]  Name: Jessica Howard MRN: [id number] Date of Birth: Nov 07, 1960  PHYSICAL THERAPY DISCHARGE SUMMARY  Visits from Start of Care: 5  Current functional level related to goals / functional outcomes: Unknown a she did not return after this visit   Remaining deficits: Unknown   Education / Equipment: HEP Plan:                                                    Patient goals were not met. Patient is being discharged due to not returning since the last visit.   ?????   Noralee Stain PT 03/18/17   7:59 AM

## 2017-01-03 ENCOUNTER — Encounter (HOSPITAL_COMMUNITY): Payer: Self-pay

## 2017-01-03 ENCOUNTER — Ambulatory Visit: Payer: Medicare Other

## 2017-01-03 ENCOUNTER — Other Ambulatory Visit: Payer: Medicare Other

## 2017-01-03 ENCOUNTER — Ambulatory Visit (HOSPITAL_COMMUNITY)
Admission: EM | Admit: 2017-01-03 | Discharge: 2017-01-03 | Disposition: A | Discharge status: Home / Self Care | Payer: Medicare Other | Attending: Internal Medicine | Admitting: Internal Medicine

## 2017-01-03 DIAGNOSIS — R0981 Nasal congestion: Secondary | ICD-10-CM

## 2017-01-03 DIAGNOSIS — H6121 Impacted cerumen, right ear: Secondary | ICD-10-CM

## 2017-01-03 DIAGNOSIS — H68101 Unspecified obstruction of Eustachian tube, right ear: Secondary | ICD-10-CM

## 2017-01-03 MED ORDER — PREDNISONE 50 MG PO TABS: 50.0000 mg | ORAL_TABLET | Freq: Every day | ORAL | 0 refills | Status: DC

## 2017-01-03 MED ORDER — NEOMYCIN-POLYMYXIN-HC 3.5-10000-1 OT SUSP: 3.0000 [drp] | OTIC | 0 refills | Status: DC

## 2017-01-03 MED ORDER — TRIAMCINOLONE ACETONIDE 55 MCG/ACT NA AERO: 2.0000 | INHALATION_SPRAY | Freq: Every day | NASAL | 0 refills | Status: DC

## 2017-01-03 NOTE — Discharge Instructions (Addendum)
On exam, the right ear head quite a bit of earwax, and also quite a bit of congestion. Ear was irrigated, with removal of a good amount of wax. May be beneficial to use some sweets oil at bedtime for the next several nights, and flush the ear out with warm water the following morning. We will also try decongesting with a couple doses of prednisone, and a nasal steroid spray. Recheck or follow up with your primary care provider if things are not improving in the next several days, or for new fever >100.5, increasing productive cough/nasal discharge.

## 2017-01-03 NOTE — ED Triage Notes (Signed)
Patient presents to [facility] with hearing loss to rt ear since last night 01/02/2017, pt states as of today she can hear an ocean like sound, pt denies any injury

## 2017-01-03 NOTE — ED Provider Notes (Signed)
[place]    CSN: [id number] Arrival date & time: 01/03/17  1759     History   Chief Complaint Chief Complaint  Patient presents with  . Hearing Loss    rt ear    HPI Jessica Howard is a 56 y.o. female. She presents today with the onset of hearing loss last night in her right ear. Thougnt she had some wax in the ear and put some olive oil in it overnight. Irrigation in the shower this morning did not improve the situation. She is having some hot flashes and sleeps with a fan on her, started this fairly recently. Does not have a lot of difficulty with runny nose, cough, sore throat. Does have ringing in the ear. Subtle little bit of nausea transiently last night. No other symptoms reported.    HPI  Past Medical History:  Diagnosis Date  . Asthma   . Diabetes mellitus    type 2  . Diabetic neuropathy ([place])   . Endometrial polyp   . Fibroadenoma of breast    left  . GERD (gastroesophageal reflux disease)   . Headache    migraines  . Hepatic steatosis   . Hyperlipidemia   . Hypertension   . Impingement syndrome of right shoulder   . Nontraumatic tear of right rotator cuff   . Pneumonia   . Psoriasis   . Rheumatoid arthritis(714.0)    rhematoid and osteoarthritis  . Sleep apnea   . Thyroid disease    had radiation  . Unspecified deficiency anemia    Thalassemia    Patient Active Problem List   Diagnosis Date Noted  . Elevated C-reactive protein (CRP)   . Elevated erythrocyte sedimentation rate   . Leukocytosis   . Hip joint effusion 01/20/2016  . Right hip pain 01/19/2016  . Hip pain 01/19/2016  . Congestive heart failure (CHF) ([place]) 12/01/2015  . Anemia 12/01/2015  . CHF (congestive heart failure) ([place]) 12/01/2015  . Acute diastolic CHF (congestive heart failure) ([place])   . Right rotator cuff tear 05/20/2014  . Nontraumatic tear of right rotator cuff   . Impingement syndrome of right shoulder   . Thalassemia 02/28/2014  . On prednisone  therapy 02/28/2014  . Rheumatoid arthritis ([place]) 02/28/2014  . Morbid obesity ([place]) 02/28/2014  . Shortness of breath 02/28/2014  . Renal insufficiency 02/28/2014  . Edema 02/28/2014  . Back pain 02/28/2014  . Essential hypertension 02/28/2014  . Diabetes ([place]) 02/28/2014  . Dyslipidemia 02/28/2014  . Obstructive sleep apnea 02/28/2014  . Acute on chronic diastolic congestive heart failure, NYHA class 2 ([place]) 02/28/2014  . Chest pain 02/27/2014  . Iron deficiency anemia 02/16/2014  . Post-menopausal bleeding 08/28/2012    Past Surgical History:  Procedure Laterality Date  . ANKLE ARTHROSCOPY Right   . CESAREAN SECTION    . COLONOSCOPY    . DILATION AND CURETTAGE OF UTERUS    . EYE SURGERY     surgery on retina  . HAND SURGERY     for arthritis  . left knee surgery nodule removal    . OTHER SURGICAL HISTORY     multiple nodule removal on neck, hands, left knee  . SHOULDER ARTHROSCOPY WITH ROTATOR CUFF REPAIR AND SUBACROMIAL DECOMPRESSION Right 05/20/2014   Procedure: RIGHT SHOULDER ARTHROSCOPY WITH DEBRIDEMENT/DISTAL CLAVICLE EXCISION/ROTATOR CUFF REPAIR AND SUBACROMIAL DECOMPRESSION;  Surgeon: Lorn Junes, MD;  Location: [facility];  Service: Orthopedics;  Laterality: Right;  . TONSILLECTOMY    . TUBAL LIGATION  bilateral    OB History    Gravida Para Term Preterm AB Living   3 2 2  0 1 2   SAB TAB Ectopic Multiple Live Births   1 0 0 0         Home Medications    Prior to Admission medications   Medication Sig Start Date End Date Taking? Authorizing Provider  abatacept (ORENCIA) 250 MG injection Inject 1,000 mg into the vein every 28 (twenty-eight) days. Monthly Infusion   Yes [provider]  albuterol (PROVENTIL HFA;VENTOLIN HFA) 108 (90 Base) MCG/ACT inhaler Inhale 2 puffs into the lungs every 6 (six) hours as needed for wheezing or shortness of breath.   Yes [provider]  amitriptyline (ELAVIL) 25 MG tablet Take 25 mg by mouth at bedtime.     Yes [provider]  BYSTOLIC 10 MG tablet  4/62/70  Yes [provider]  chlorthalidone (HYGROTON) 25 MG tablet Take 25 mg by mouth daily.   Yes [provider]  CRESTOR 20 MG tablet Take 20 mg by mouth daily.  03/14/14  Yes [provider]  diltiazem (CARDIZEM CD) 240 MG 24 hr capsule Take 240 mg by mouth daily.  02/03/14  Yes [provider]  gabapentin (NEURONTIN) 300 MG capsule Take 600 mg by mouth 3 (three) times daily.    Yes [provider]  HYDROcodone-acetaminophen (NORCO/VICODIN) 5-325 MG tablet Take 1 tablet by mouth every 6 (six) hours as needed for moderate pain.   Yes [provider]  insulin aspart (NOVOLOG) 100 UNIT/ML injection Inject 55-60 Units into the skin 3 (three) times daily before meals. 55 base Plus sliding scale   Yes [provider]  insulin NPH (HUMULIN N,NOVOLIN N) 100 UNIT/ML injection Inject 75 Units into the skin 3 (three) times daily with meals. 75 units with breakfast; lunch 75 units; 75 units at dinner   Yes [provider]  irbesartan (AVAPRO) 300 MG tablet Take 300 mg by mouth daily.    Yes [provider]  leflunomide (ARAVA) 20 MG tablet Take 20 mg by mouth daily.   Yes [provider]  metFORMIN (GLUCOPHAGE-XR) 500 MG 24 hr tablet Take 500 mg by mouth every evening.   Yes [provider]  Nebivolol HCl (BYSTOLIC) 20 MG TABS Take 20 mg by mouth daily.    Yes [provider]  pantoprazole (PROTONIX) 40 MG tablet Take 40 mg by mouth daily.   Yes [provider]  topiramate (TOPAMAX) 200 MG tablet Take 200 mg by mouth daily. 07/20/16  Yes [provider]  TRULICITY 3.50 KX/3.8HW SOPN Inject 0.75 mg into the skin once a week. Patient takes on Wednesday. 08/29/15  Yes [provider]  VOLTAREN 1 % GEL Apply 1 application topically 3 (three) times daily as needed (for pain).  02/03/14  Yes [provider]  ARTIFICIAL  TEAR OP Place 1 drop into both eyes as needed (for dry eyes).    [provider]  cyclobenzaprine (FLEXERIL) 10 MG tablet  11/20/16   [provider]  fluticasone (FLONASE) 50 MCG/ACT nasal spray Place 1 spray into both nostrils daily as needed for allergies or rhinitis.    [provider]  neomycin-polymyxin-hydrocortisone (CORTISPORIN) 3.5-10000-1 OTIC suspension Place 3 drops into the right ear every 4 (four) hours. 01/03/17   Sherlene Shams, MD  predniSONE (DELTASONE) 50 MG tablet Take 1 tablet (50 mg total) by mouth daily. 01/03/17   Sherlene Shams, MD  tiZANidine (ZANAFLEX) 4 MG tablet Take 4 mg by mouth 2 (two) times daily as needed (migraines). Limit 2 days per week 01/12/14   [provider]  tobramycin (TOBREX) 0.3 % ophthalmic ointment Place 1 application into the left eye 3 (three) times daily.    [provider]  triamcinolone (NASACORT) 55 MCG/ACT AERO nasal inhaler Place 2 sprays into the nose daily. 01/03/17   Sherlene Shams, MD    Family History Family History  Problem Relation Age of Onset  . Diabetes Mother   . Cancer Mother        breast  . Aneurysm Father        brain  . Diabetes Sister   . Cancer Brother        bone marrow cancer    Social History Social History  Substance Use Topics  . Smoking status: Former Smoker    Packs/day: 0.25    Years: 6.00    Types: Cigarettes    Quit date: 06/09/2013  . Smokeless tobacco: Never Used  . Alcohol use No     Allergies   Bactrim [sulfamethoxazole-trimethoprim]; Cefuroxime axetil; Cephalosporins; Iohexol; Lisinopril; Penicillins; and Sulfa antibiotics   Review of Systems Review of Systems  All other systems reviewed and are negative.    Physical Exam Triage Vital Signs ED Triage Vitals [01/03/17 1832]  Enc Vitals Group     BP (!) 174/95     Pulse Rate 79     Resp 16     Temp 98.2 F (36.8 C)     Temp Source Oral     SpO2 96 %     Weight      Height       Pain Score      Pain Loc    Updated Vital Signs BP (!) 174/95 (BP Location: Right Arm)   Pulse 79   Temp 98.2 F (36.8 C) (Oral)   Resp 16   LMP 12/27/2013 Comment: irreggular  SpO2 96%   Physical Exam  Constitutional: She is oriented to person, place, and time. No distress.  HENT:  Head: Atraumatic.  Bilateral TMs are opaque, red tinged. Rim of dark wax present in the right ear canal, does not appear to be occlusive. Flakiness in the left ear canal. No pain with manipulation of either outer ear. Moderate nasal congestion on the left, marked nasal congestion on the right, nearly occluded. Throat is slightly injected.  Eyes:  Conjugate gaze observed, no eye redness/discharge  Neck: Neck supple.  Cardiovascular: Normal rate.   Pulmonary/Chest: No respiratory distress.  Abdominal: She exhibits no distension.  Musculoskeletal: Normal range of motion.  Neurological: She is alert and oriented to person, place, and time.  Skin: Skin is warm and dry.  Nursing note and vitals reviewed.    UC Treatments / Results   Procedures Procedures (including critical care time) Irrigation by nurse with moderate amount of wax returned, patient reports improvement   Final Clinical Impressions(s) / UC Diagnoses   Final diagnoses:  Cerumen debris on tympanic membrane of right ear  Sinus congestion  Obstruction of right eustachian tube   On exam, the right ear head quite a bit of earwax, and also quite a bit of congestion. Ear was irrigated, with removal of a good amount of wax. May be beneficial to use some sweets oil at bedtime for the next several nights, and flush the ear out with warm water the following morning. We will also try decongesting with a couple  doses of prednisone, and a nasal steroid spray. Recheck or follow up with your primary care provider if things are not improving in the next several days, or for new fever >100.5, increasing productive cough/nasal discharge.    New  Prescriptions Discharge Medication List as of 01/03/2017  7:36 PM    START taking these medications   Details  neomycin-polymyxin-hydrocortisone (CORTISPORIN) 3.5-10000-1 OTIC suspension Place 3 drops into the right ear every 4 (four) hours., Starting Fri 01/03/2017, Normal    predniSONE (DELTASONE) 50 MG tablet Take 1 tablet (50 mg total) by mouth daily., Starting Fri 01/03/2017, Normal    triamcinolone (NASACORT) 55 MCG/ACT AERO nasal inhaler Place 2 sprays into the nose daily., Starting Fri 01/03/2017, Normal         Sherlene Shams, MD 01/04/17 1013

## 2017-01-06 ENCOUNTER — Ambulatory Visit: Payer: Medicare Other | Admitting: Physical Therapy

## 2017-01-06 DIAGNOSIS — H6691 Otitis media, unspecified, right ear: Secondary | ICD-10-CM | POA: Diagnosis not present

## 2017-01-07 ENCOUNTER — Ambulatory Visit: Payer: Self-pay | Admitting: *Deleted

## 2017-01-07 DIAGNOSIS — R42 Dizziness and giddiness: Secondary | ICD-10-CM | POA: Diagnosis not present

## 2017-01-07 DIAGNOSIS — H9041 Sensorineural hearing loss, unilateral, right ear, with unrestricted hearing on the contralateral side: Secondary | ICD-10-CM | POA: Diagnosis not present

## 2017-01-07 DIAGNOSIS — H9120 Sudden idiopathic hearing loss, unspecified ear: Secondary | ICD-10-CM | POA: Diagnosis not present

## 2017-01-08 ENCOUNTER — Ambulatory Visit: Payer: Medicare Other | Admitting: Physical Therapy

## 2017-01-14 ENCOUNTER — Other Ambulatory Visit: Payer: Self-pay | Admitting: Physician Assistant

## 2017-01-14 DIAGNOSIS — H9120 Sudden idiopathic hearing loss, unspecified ear: Secondary | ICD-10-CM

## 2017-01-17 ENCOUNTER — Ambulatory Visit
Admission: RE | Admit: 2017-01-17 | Discharge: 2017-01-17 | Disposition: A | Discharge status: Home / Self Care | Payer: Medicare Other | Source: Ambulatory Visit | Attending: Physician Assistant | Admitting: Physician Assistant

## 2017-01-17 DIAGNOSIS — R42 Dizziness and giddiness: Secondary | ICD-10-CM | POA: Diagnosis not present

## 2017-01-17 DIAGNOSIS — H9120 Sudden idiopathic hearing loss, unspecified ear: Secondary | ICD-10-CM

## 2017-01-17 MED ORDER — GADOBENATE DIMEGLUMINE 529 MG/ML IV SOLN
20.0000 mL | Freq: Once | INTRAVENOUS | Status: AC | PRN
  Administered 2017-01-17: 20 mL via INTRAVENOUS

## 2017-01-20 DIAGNOSIS — M5441 Lumbago with sciatica, right side: Secondary | ICD-10-CM | POA: Diagnosis not present

## 2017-01-20 DIAGNOSIS — Z79899 Other long term (current) drug therapy: Secondary | ICD-10-CM | POA: Diagnosis not present

## 2017-01-20 DIAGNOSIS — E7800 Pure hypercholesterolemia, unspecified: Secondary | ICD-10-CM | POA: Diagnosis not present

## 2017-01-20 DIAGNOSIS — Z794 Long term (current) use of insulin: Secondary | ICD-10-CM | POA: Diagnosis not present

## 2017-01-20 DIAGNOSIS — G8929 Other chronic pain: Secondary | ICD-10-CM | POA: Diagnosis not present

## 2017-01-20 DIAGNOSIS — I10 Essential (primary) hypertension: Secondary | ICD-10-CM | POA: Diagnosis not present

## 2017-01-20 DIAGNOSIS — E119 Type 2 diabetes mellitus without complications: Secondary | ICD-10-CM | POA: Diagnosis not present

## 2017-01-21 ENCOUNTER — Other Ambulatory Visit: Payer: Medicare Other | Admitting: *Deleted

## 2017-01-21 DIAGNOSIS — H905 Unspecified sensorineural hearing loss: Secondary | ICD-10-CM | POA: Diagnosis not present

## 2017-01-21 DIAGNOSIS — H8301 Labyrinthitis, right ear: Secondary | ICD-10-CM | POA: Diagnosis not present

## 2017-01-21 NOTE — Patient Outreach (Signed)
[name]) Care Management  01/21/2017  Jessica Howard 1961/03/15 [id number]   RN Health Coach attempted #1 follow up outreach call to patient.  Patient was unavailable. HIPPA compliance voicemail message left with return callback number.  Plan: RN will call patient again within 14 days.  [place] Care Management [phone]

## 2017-01-22 ENCOUNTER — Ambulatory Visit: Payer: Medicare Other | Admitting: Podiatry

## 2017-01-29 ENCOUNTER — Encounter (HOSPITAL_COMMUNITY)
Admission: RE | Admit: 2017-01-29 | Discharge: 2017-01-29 | Disposition: A | Discharge status: Home / Self Care | Payer: Medicare Other | Source: Ambulatory Visit | Attending: Rheumatology | Admitting: Rheumatology

## 2017-01-29 DIAGNOSIS — D649 Anemia, unspecified: Secondary | ICD-10-CM | POA: Diagnosis present

## 2017-01-29 DIAGNOSIS — M069 Rheumatoid arthritis, unspecified: Secondary | ICD-10-CM | POA: Diagnosis not present

## 2017-01-29 DIAGNOSIS — D509 Iron deficiency anemia, unspecified: Secondary | ICD-10-CM | POA: Insufficient documentation

## 2017-01-29 DIAGNOSIS — N289 Disorder of kidney and ureter, unspecified: Secondary | ICD-10-CM | POA: Insufficient documentation

## 2017-01-29 MED ORDER — ABATACEPT 250 MG IV SOLR
1000.0000 mg | INTRAVENOUS | Status: DC
  Administered 2017-01-29: 09:00:00 1000 mg via INTRAVENOUS
  Filled 2017-01-29: qty 40

## 2017-01-29 MED ORDER — SODIUM CHLORIDE 0.9 % IV SOLN
INTRAVENOUS | Status: DC
  Administered 2017-01-29: 09:00:00 via INTRAVENOUS

## 2017-01-29 NOTE — Progress Notes (Signed)
Pt stated that at the end of July she lost all hearing in her right ear.  She stated she has seen and ENT, had a MRI of the brain that did not show any lesions, and a hearing test after that showed 100% hearing loss in right ear, and vertigo.  Called and spoke with Levada Dy at Dr Audelia Hives office who stated it was okay to proceed with [place] today but to ask the patient to get them the notes from the ENT.  I told the patient to get them the ENT notes and she verbalized understanding.

## 2017-01-30 ENCOUNTER — Other Ambulatory Visit: Payer: Self-pay | Admitting: *Deleted

## 2017-01-30 NOTE — Patient Outreach (Signed)
[facility]) Care Management  01/30/2017   Jessica Howard December 11, 1960 [id number]    RN Health Coach telephone call to patient.  Hipaa compliance verified. Per patient stated her fasting blood sugar was 118. Patient stated she has been to the ER and ENT since I last outreach call  with her . Per patient she is now deaf 100% in the right rear. She stated it happened suddenly. Per patient she has to go back to look at getting a hearing aid. Patient doesn't know if insurance will cover. She will call and let me know if she needs a referral to Education officer, museum. Per patient she has not started the [facility]. She stated she has received the application but has not filled it out. Patient stated she is trying to eat better.RN discussed the importance of follow up with her appointments and health. Patient reported taking medications as prescribed.  Patient has agreed to follow up outreach calls.   Current Medications:  Current Outpatient Prescriptions  Medication Sig Dispense Refill  . abatacept (ORENCIA) 250 MG injection Inject 1,000 mg into the vein every 28 (twenty-eight) days. Monthly Infusion    . albuterol (PROVENTIL HFA;VENTOLIN HFA) 108 (90 Base) MCG/ACT inhaler Inhale 2 puffs into the lungs every 6 (six) hours as needed for wheezing or shortness of breath.    Marland Kitchen amitriptyline (ELAVIL) 25 MG tablet Take 25 mg by mouth at bedtime.     . ARTIFICIAL TEAR OP Place 1 drop into both eyes as needed (for dry eyes).    . BYSTOLIC 10 MG tablet     . chlorthalidone (HYGROTON) 25 MG tablet Take 25 mg by mouth daily.    . CRESTOR 20 MG tablet Take 20 mg by mouth daily.     . cyclobenzaprine (FLEXERIL) 10 MG tablet     . diltiazem (CARDIZEM CD) 240 MG 24 hr capsule Take 240 mg by mouth daily.     . fluticasone (FLONASE) 50 MCG/ACT nasal spray Place 1 spray into both nostrils daily as needed for allergies or rhinitis.    Marland Kitchen gabapentin (NEURONTIN) 300 MG capsule Take 600 mg by mouth 3 (three) times  daily.     Marland Kitchen HYDROcodone-acetaminophen (NORCO/VICODIN) 5-325 MG tablet Take 1 tablet by mouth every 6 (six) hours as needed for moderate pain.    Marland Kitchen insulin aspart (NOVOLOG) 100 UNIT/ML injection Inject 55-60 Units into the skin 3 (three) times daily before meals. 55 base Plus sliding scale    . insulin NPH (HUMULIN N,NOVOLIN N) 100 UNIT/ML injection Inject 75 Units into the skin 3 (three) times daily with meals. 75 units with breakfast; lunch 75 units; 75 units at dinner    . irbesartan (AVAPRO) 300 MG tablet Take 300 mg by mouth daily.     Marland Kitchen leflunomide (ARAVA) 20 MG tablet Take 20 mg by mouth daily.    . meclizine (ANTIVERT) 25 MG tablet Take 25 mg by mouth 3 (three) times daily as needed for dizziness.    . metFORMIN (GLUCOPHAGE-XR) 500 MG 24 hr tablet Take 500 mg by mouth every evening.    . Nebivolol HCl (BYSTOLIC) 20 MG TABS Take 20 mg by mouth daily.     Marland Kitchen neomycin-polymyxin-hydrocortisone (CORTISPORIN) 3.5-10000-1 OTIC suspension Place 3 drops into the right ear every 4 (four) hours. 10 mL 0  . pantoprazole (PROTONIX) 40 MG tablet Take 40 mg by mouth daily.    . predniSONE (DELTASONE) 50 MG tablet Take 1 tablet (50 mg total) by mouth daily.  3 tablet 0  . tiZANidine (ZANAFLEX) 4 MG tablet Take 4 mg by mouth 2 (two) times daily as needed (migraines). Limit 2 days per week    . tobramycin (TOBREX) 0.3 % ophthalmic ointment Place 1 application into the left eye 3 (three) times daily.    Marland Kitchen topiramate (TOPAMAX) 200 MG tablet Take 200 mg by mouth daily.    Marland Kitchen triamcinolone (NASACORT) 55 MCG/ACT AERO nasal inhaler Place 2 sprays into the nose daily. 1 Inhaler 0  . TRULICITY 1.74 YC/1.4GY SOPN Inject 0.75 mg into the skin once a week. Patient takes on Wednesday.    . VOLTAREN 1 % GEL Apply 1 application topically 3 (three) times daily as needed (for pain).      No current facility-administered medications for this visit.     Functional Status:  In your present state of health, do you have any  difficulty performing the following activities: 01/30/2017 01/29/2017  Hearing? N N  Vision? N N  Difficulty concentrating or making decisions? N N  Walking or climbing stairs? N N  Dressing or bathing? N N  Doing errands, shopping? Y -  Conservation officer, nature and eating ? N -  Using the Toilet? N -  In the past six months, have you accidently leaked urine? N -  Do you have problems with loss of bowel control? N -  Managing your Medications? N -  Managing your Finances? N -  Housekeeping or managing your Housekeeping? Y -  Some recent data might be hidden    Fall/Depression Screening: Fall Risk  01/30/2017 12/06/2016 10/14/2016  Falls in the past year? No No No  Number falls in past yr: - - -  Risk for fall due to : Impaired balance/gait;Impaired mobility Impaired balance/gait;Impaired mobility -   PHQ 2/9 Scores 01/30/2017 12/09/2016 12/06/2016 10/14/2016 08/26/2016 03/29/2016 02/28/2016  PHQ - 2 Score 0 0 0 0 0 0 0   THN CM Care Plan Problem One     Most Recent Value  Care Plan Problem One  Knowledge Deficit in self management of diabetes  Role Documenting the Problem One  [place] for Problem One  Active  THN Long Term Goal   Patient will see a decrease in Hgb A1C within the next 90 days from 10.0  Interventions for Problem One Long Term Goal  RN sent patient educational material on A1C. RN discussed with patient what the A1C means abd how the blood sugars affect the A1C.RN discussed what the fasting blood sugar should be to make the A1C below 7   THN CM Short Term Goal #1   Patient will report checking into water aerobics within the next 30 days  THN CM Short Term Goal #1 Start Date  01/30/17  Interventions for Short Term Goal #1  RN discussed with patient the importance of weight loss. RN discussd classes available for medicaid and medicare recipients at the [facility]  [facility] CM Short Term Goal #2   Patient will report her fasting blood sugar is below 200 within the next 30 days  THN CM  Short Term Goal #2 Met Date  01/30/17  Interventions for Short Term Goal #2  RN discussed the importance of fasting blood sugar range and 2 hr pc range. RN will send calendar book for documenting. RN will follow up next outreach call  [facility] CM Short Term Goal #4  Patient will be able to describe  healthy snack within the next 30 days  THN CM  Short Term Goal #4 Start Date  01/30/17  Interventions for Short Term Goal #4  RN sent a list of healthy snacks. RN will send a list of diabetic foods from fast food chain. RN will follow up discussion and teach back   THN CM Short Term Goal #5   Patient will be able to describe foods low in carbohydrates within the next 30 days  THN CM Short Term Goal #5 Start Date  01/30/17  Interventions for Short Term Goal #5  RN sent EMMI educational material Diabetes food and exercise, Counting carbohydrates. RN sent a chart with high and low carbohydrates pictures. RN will follow up with discussion and teach back       Assessment:  Fasting blood sugar is 118 Loss of hearing in rt ear Has not looked further into the [facility] Patient will continue to benefit from [place] telephonic outreach for education and support for diabetes self management.  Plan:  Patient is going to fill out paper work for the [employer] discussed healthy eating Patient will follow up looking into hearing aid benefits Patient will monitor and check blood sugars closely  [place] Management [phone]  RN will follow up outreach within the [facility] September

## 2017-02-03 ENCOUNTER — Other Ambulatory Visit (HOSPITAL_BASED_OUTPATIENT_CLINIC_OR_DEPARTMENT_OTHER): Payer: Medicare Other

## 2017-02-03 ENCOUNTER — Ambulatory Visit (HOSPITAL_BASED_OUTPATIENT_CLINIC_OR_DEPARTMENT_OTHER): Payer: Medicare Other

## 2017-02-03 VITALS — BP 120/67 | HR 78 | Temp 98.0°F | Resp 20

## 2017-02-03 DIAGNOSIS — N189 Chronic kidney disease, unspecified: Secondary | ICD-10-CM

## 2017-02-03 DIAGNOSIS — D631 Anemia in chronic kidney disease: Secondary | ICD-10-CM

## 2017-02-03 DIAGNOSIS — D509 Iron deficiency anemia, unspecified: Secondary | ICD-10-CM

## 2017-02-03 DIAGNOSIS — N289 Disorder of kidney and ureter, unspecified: Secondary | ICD-10-CM

## 2017-02-03 DIAGNOSIS — D569 Thalassemia, unspecified: Secondary | ICD-10-CM

## 2017-02-03 LAB — CBC WITH DIFFERENTIAL/PLATELET
BASO%: 0.4 % (ref 0.0–2.0)
BASOS ABS: 0 10*3/uL (ref 0.0–0.1)
EOS ABS: 0.2 10*3/uL (ref 0.0–0.5)
EOS%: 2.3 % (ref 0.0–7.0)
HEMATOCRIT: 31.6 % — AB (ref 34.8–46.6)
HGB: 9.8 g/dL — ABNORMAL LOW (ref 11.6–15.9)
LYMPH%: 46.7 % (ref 14.0–49.7)
MCH: 20.2 pg — ABNORMAL LOW (ref 25.1–34.0)
MCHC: 31 g/dL — ABNORMAL LOW (ref 31.5–36.0)
MCV: 65 fL — AB (ref 79.5–101.0)
MONO#: 0.9 10*3/uL (ref 0.1–0.9)
MONO%: 12.3 % (ref 0.0–14.0)
NEUT%: 38.3 % — ABNORMAL LOW (ref 38.4–76.8)
NEUTROS ABS: 2.9 10*3/uL (ref 1.5–6.5)
NRBC: 0 % (ref 0–0)
PLATELETS: 259 10*3/uL (ref 145–400)
RBC: 4.86 10*6/uL (ref 3.70–5.45)
RDW: 18.3 % — AB (ref 11.2–14.5)
WBC: 7.5 10*3/uL (ref 3.9–10.3)
lymph#: 3.5 10*3/uL — ABNORMAL HIGH (ref 0.9–3.3)

## 2017-02-03 MED ORDER — DARBEPOETIN ALFA 300 MCG/0.6ML IJ SOSY
300.0000 ug | PREFILLED_SYRINGE | Freq: Once | INTRAMUSCULAR | Status: AC
  Administered 2017-02-03: 300 ug via SUBCUTANEOUS
  Filled 2017-02-03: qty 0.6

## 2017-02-03 NOTE — Patient Instructions (Signed)
Darbepoetin Alfa injection What is this medicine? DARBEPOETIN ALFA (dar be POE e tin AL fa) helps your body make more red blood cells. It is used to treat anemia caused by chronic kidney failure and chemotherapy. This medicine may be used for other purposes; ask your health care provider or pharmacist if you have questions. COMMON BRAND NAME(S): Aranesp What should I tell my health care provider before I take this medicine? They need to know if you have any of these conditions: -blood clotting disorders or history of blood clots -cancer patient not on chemotherapy -cystic fibrosis -heart disease, such as angina, heart failure, or a history of a heart attack -hemoglobin level of 12 g/dL or greater -high blood pressure -low levels of folate, iron, or vitamin B12 -seizures -an unusual or allergic reaction to darbepoetin, erythropoietin, albumin, hamster proteins, latex, other medicines, foods, dyes, or preservatives -pregnant or trying to get pregnant -breast-feeding How should I use this medicine? This medicine is for injection into a vein or under the skin. It is usually given by a health care professional in a hospital or clinic setting. If you get this medicine at home, you will be taught how to prepare and give this medicine. Do not shake the solution before you withdraw a dose. Use exactly as directed. Take your medicine at regular intervals. Do not take your medicine more often than directed. It is important that you put your used needles and syringes in a special sharps container. Do not put them in a trash can. If you do not have a sharps container, call your pharmacist or healthcare provider to get one. Talk to your pediatrician regarding the use of this medicine in children. While this medicine may be used in children as young as 1 year for selected conditions, precautions do apply. Overdosage: If you think you have taken too much of this medicine contact a poison control center or  emergency room at once. NOTE: This medicine is only for you. Do not share this medicine with others. What if I miss a dose? If you miss a dose, take it as soon as you can. If it is almost time for your next dose, take only that dose. Do not take double or extra doses. What may interact with this medicine? Do not take this medicine with any of the following medications: -epoetin alfa This list may not describe all possible interactions. Give your health care provider a list of all the medicines, herbs, non-prescription drugs, or dietary supplements you use. Also tell them if you smoke, drink alcohol, or use illegal drugs. Some items may interact with your medicine. What should I watch for while using this medicine? Visit your prescriber or health care professional for regular checks on your progress and for the needed blood tests and blood pressure measurements. It is especially important for the doctor to make sure your hemoglobin level is in the desired range, to limit the risk of potential side effects and to give you the best benefit. Keep all appointments for any recommended tests. Check your blood pressure as directed. Ask your doctor what your blood pressure should be and when you should contact him or her. As your body makes more red blood cells, you may need to take iron, folic acid, or vitamin B supplements. Ask your doctor or health care provider which products are right for you. If you have kidney disease continue dietary restrictions, even though this medication can make you feel better. Talk with your doctor or health   care professional about the foods you eat and the vitamins that you take. What side effects may I notice from receiving this medicine? Side effects that you should report to your doctor or health care professional as soon as possible: -allergic reactions like skin rash, itching or hives, swelling of the face, lips, or tongue -breathing problems -changes in vision -chest  pain -confusion, trouble speaking or understanding -feeling faint or lightheaded, falls -high blood pressure -muscle aches or pains -pain, swelling, warmth in the leg -rapid weight gain -severe headaches -sudden numbness or weakness of the face, arm or leg -trouble walking, dizziness, loss of balance or coordination -seizures (convulsions) -swelling of the ankles, feet, hands -unusually weak or tired Side effects that usually do not require medical attention (report to your doctor or health care professional if they continue or are bothersome): -diarrhea -fever, chills (flu-like symptoms) -headaches -nausea, vomiting -redness, stinging, or swelling at site where injected This list may not describe all possible side effects. Call your doctor for medical advice about side effects. You may report side effects to FDA at 1-800-FDA-1088. Where should I keep my medicine? Keep out of the reach of children. Store in a refrigerator between 2 and 8 degrees C (36 and 46 degrees F). Do not freeze. Do not shake. Throw away any unused portion if using a single-dose vial. Throw away any unused medicine after the expiration date. NOTE: This sheet is a summary. It may not cover all possible information. If you have questions about this medicine, talk to your doctor, pharmacist, or health care provider.  2015, [employer]. (2008-05-10 10:23:57)  

## 2017-02-06 DIAGNOSIS — Z79899 Other long term (current) drug therapy: Secondary | ICD-10-CM | POA: Diagnosis not present

## 2017-02-06 DIAGNOSIS — M545 Low back pain: Secondary | ICD-10-CM | POA: Diagnosis not present

## 2017-02-06 DIAGNOSIS — M069 Rheumatoid arthritis, unspecified: Secondary | ICD-10-CM | POA: Diagnosis not present

## 2017-02-06 DIAGNOSIS — M150 Primary generalized (osteo)arthritis: Secondary | ICD-10-CM | POA: Diagnosis not present

## 2017-02-18 DIAGNOSIS — H16142 Punctate keratitis, left eye: Secondary | ICD-10-CM | POA: Diagnosis not present

## 2017-02-18 DIAGNOSIS — H1859 Other hereditary corneal dystrophies: Secondary | ICD-10-CM | POA: Diagnosis not present

## 2017-02-18 DIAGNOSIS — H01009 Unspecified blepharitis unspecified eye, unspecified eyelid: Secondary | ICD-10-CM | POA: Diagnosis not present

## 2017-02-18 DIAGNOSIS — H1851 Endothelial corneal dystrophy: Secondary | ICD-10-CM | POA: Diagnosis not present

## 2017-02-20 DIAGNOSIS — H1789 Other corneal scars and opacities: Secondary | ICD-10-CM | POA: Diagnosis not present

## 2017-02-20 DIAGNOSIS — H1859 Other hereditary corneal dystrophies: Secondary | ICD-10-CM | POA: Diagnosis not present

## 2017-02-20 DIAGNOSIS — M5441 Lumbago with sciatica, right side: Secondary | ICD-10-CM | POA: Diagnosis not present

## 2017-02-20 DIAGNOSIS — Z79899 Other long term (current) drug therapy: Secondary | ICD-10-CM | POA: Diagnosis not present

## 2017-02-20 DIAGNOSIS — G8929 Other chronic pain: Secondary | ICD-10-CM | POA: Diagnosis not present

## 2017-02-20 DIAGNOSIS — E7800 Pure hypercholesterolemia, unspecified: Secondary | ICD-10-CM | POA: Diagnosis not present

## 2017-02-20 DIAGNOSIS — H18832 Recurrent erosion of cornea, left eye: Secondary | ICD-10-CM | POA: Diagnosis not present

## 2017-02-20 DIAGNOSIS — I10 Essential (primary) hypertension: Secondary | ICD-10-CM | POA: Diagnosis not present

## 2017-02-25 ENCOUNTER — Encounter (HOSPITAL_COMMUNITY): Payer: Medicare Other

## 2017-02-26 ENCOUNTER — Encounter (HOSPITAL_COMMUNITY): Payer: Medicare Other

## 2017-02-27 DIAGNOSIS — Z83511 Family history of glaucoma: Secondary | ICD-10-CM | POA: Diagnosis not present

## 2017-02-27 DIAGNOSIS — H40053 Ocular hypertension, bilateral: Secondary | ICD-10-CM | POA: Diagnosis not present

## 2017-02-27 DIAGNOSIS — H40023 Open angle with borderline findings, high risk, bilateral: Secondary | ICD-10-CM | POA: Diagnosis not present

## 2017-02-27 DIAGNOSIS — E113293 Type 2 diabetes mellitus with mild nonproliferative diabetic retinopathy without macular edema, bilateral: Secondary | ICD-10-CM | POA: Diagnosis not present

## 2017-02-27 DIAGNOSIS — H5340 Unspecified visual field defects: Secondary | ICD-10-CM | POA: Diagnosis not present

## 2017-02-27 DIAGNOSIS — H8110 Benign paroxysmal vertigo, unspecified ear: Secondary | ICD-10-CM | POA: Diagnosis not present

## 2017-02-27 DIAGNOSIS — R2681 Unsteadiness on feet: Secondary | ICD-10-CM | POA: Diagnosis not present

## 2017-02-27 DIAGNOSIS — L700 Acne vulgaris: Secondary | ICD-10-CM | POA: Diagnosis not present

## 2017-02-27 DIAGNOSIS — R1031 Right lower quadrant pain: Secondary | ICD-10-CM | POA: Diagnosis not present

## 2017-03-06 ENCOUNTER — Ambulatory Visit (HOSPITAL_BASED_OUTPATIENT_CLINIC_OR_DEPARTMENT_OTHER): Payer: Medicare Other | Admitting: Medical

## 2017-03-06 ENCOUNTER — Ambulatory Visit: Payer: Medicare Other | Admitting: *Deleted

## 2017-03-06 ENCOUNTER — Other Ambulatory Visit (HOSPITAL_BASED_OUTPATIENT_CLINIC_OR_DEPARTMENT_OTHER): Payer: Medicare Other

## 2017-03-06 VITALS — BP 134/62 | HR 79 | Temp 98.7°F | Resp 17 | Ht 63.0 in | Wt 273.7 lb

## 2017-03-06 DIAGNOSIS — D569 Thalassemia, unspecified: Secondary | ICD-10-CM

## 2017-03-06 DIAGNOSIS — D509 Iron deficiency anemia, unspecified: Secondary | ICD-10-CM

## 2017-03-06 DIAGNOSIS — N189 Chronic kidney disease, unspecified: Secondary | ICD-10-CM | POA: Diagnosis present

## 2017-03-06 DIAGNOSIS — D631 Anemia in chronic kidney disease: Secondary | ICD-10-CM | POA: Diagnosis not present

## 2017-03-06 DIAGNOSIS — N289 Disorder of kidney and ureter, unspecified: Secondary | ICD-10-CM

## 2017-03-06 LAB — COMPREHENSIVE METABOLIC PANEL
ALT: 28 U/L (ref 0–55)
AST: 27 U/L (ref 5–34)
Albumin: 3.4 g/dL — ABNORMAL LOW (ref 3.5–5.0)
Alkaline Phosphatase: 144 U/L (ref 40–150)
Anion Gap: 8 mEq/L (ref 3–11)
BUN: 17.1 mg/dL (ref 7.0–26.0)
CHLORIDE: 111 meq/L — AB (ref 98–109)
CO2: 23 mEq/L (ref 22–29)
Calcium: 9.6 mg/dL (ref 8.4–10.4)
Creatinine: 0.8 mg/dL (ref 0.6–1.1)
EGFR: >90 mL/min/{1.73_m2} (ref >90)
GLUCOSE: 129 mg/dL (ref 70–140)
POTASSIUM: 4.1 meq/L (ref 3.5–5.1)
Sodium: 142 mEq/L (ref 136–145)
Total Bilirubin: 0.44 mg/dL (ref 0.20–1.20)
Total Protein: 7.1 g/dL (ref 6.4–8.3)

## 2017-03-06 LAB — CBC WITH DIFFERENTIAL/PLATELET
BASO%: 0.6 % (ref 0.0–2.0)
Basophils Absolute: 0 10*3/uL (ref 0.0–0.1)
EOS ABS: 0.2 10*3/uL (ref 0.0–0.5)
EOS%: 2.6 % (ref 0.0–7.0)
HCT: 32.5 % — ABNORMAL LOW (ref 34.8–46.6)
HEMOGLOBIN: 9.9 g/dL — AB (ref 11.6–15.9)
LYMPH%: 42.9 % (ref 14.0–49.7)
MCH: 20 pg — ABNORMAL LOW (ref 25.1–34.0)
MCHC: 30.3 g/dL — ABNORMAL LOW (ref 31.5–36.0)
MCV: 66.1 fL — AB (ref 79.5–101.0)
MONO#: 1 10*3/uL — AB (ref 0.1–0.9)
MONO%: 13.7 % (ref 0.0–14.0)
NEUT%: 40.2 % (ref 38.4–76.8)
NEUTROS ABS: 2.9 10*3/uL (ref 1.5–6.5)
PLATELETS: 258 10*3/uL (ref 145–400)
RBC: 4.92 10*6/uL (ref 3.70–5.45)
RDW: 18.1 % — AB (ref 11.2–14.5)
WBC: 7.1 10*3/uL (ref 3.9–10.3)
lymph#: 3.1 10*3/uL (ref 0.9–3.3)

## 2017-03-06 MED ORDER — DARBEPOETIN ALFA 300 MCG/0.6ML IJ SOSY
300.0000 ug | PREFILLED_SYRINGE | Freq: Once | INTRAMUSCULAR | Status: AC
  Administered 2017-03-06: 300 ug via SUBCUTANEOUS
  Filled 2017-03-06: qty 0.6

## 2017-03-06 NOTE — Patient Instructions (Signed)

## 2017-03-07 ENCOUNTER — Other Ambulatory Visit: Payer: Self-pay | Admitting: *Deleted

## 2017-03-07 DIAGNOSIS — Z794 Long term (current) use of insulin: Principal | ICD-10-CM

## 2017-03-07 DIAGNOSIS — E0800 Diabetes mellitus due to underlying condition with hyperosmolarity without nonketotic hyperglycemic-hyperosmolar coma (NKHHC): Secondary | ICD-10-CM

## 2017-03-07 NOTE — Patient Outreach (Signed)
Jessica Howard [name]) Care Management  03/07/2017   Jessica Howard 23-Jan-1961 [id number]  RN Health Coach telephone call to patient.  H[employer]aa compliance verified. Per patient her fasting blood sugar was  125. Per patient she has one episode of 78 blood sugar and became shaky. Patient has scheduled follow up appointment with podiatrist on Oct 4th, and eye exam  rescheduled  For Oct ober and Wellness check October 16 th. Patient has a dental cleaning scheduled for November.  Patient went to eye Dr for cornea scratch on tuesday .Patient went for hearing aid fitting. She was told that it is a 825.05 application fee. Per patient she also will need new glasses she has not received any in 3 years. Per patient she can not afford this. Patient stated she has been going to a dermatologist because she has skin eruptions all over her face. Patient stated on her next diabetic exam she is going to ask the physician about the [employer].  Per patient she has been trying to eat much better. She is monitoring her snacks. Per patient her fasting blood sugar is 125 and below. Patient has agreed to outreach calls.  Current Medications:  Current Outpatient Prescriptions  Medication Sig Dispense Refill  . abatacept (ORENCIA) 250 MG injection Inject 1,000 mg into the vein every 28 (twenty-eight) days. Monthly Infusion    . albuterol (PROVENTIL HFA;VENTOLIN HFA) 108 (90 Base) MCG/ACT inhaler Inhale 2 puffs into the lungs every 6 (six) hours as needed for wheezing or shortness of breath.    Marland Kitchen amitriptyline (ELAVIL) 25 MG tablet Take 25 mg by mouth at bedtime.     . ARTIFICIAL TEAR OP Place 1 drop into both eyes as needed (for dry eyes).    . BYSTOLIC 10 MG tablet     . chlorthalidone (HYGROTON) 25 MG tablet Take 25 mg by mouth daily.    . CRESTOR 20 MG tablet Take 20 mg by mouth daily.     . cyclobenzaprine (FLEXERIL) 10 MG tablet     . diltiazem (CARDIZEM CD) 240 MG 24 hr capsule Take 240 mg by mouth  daily.     . fluticasone (FLONASE) 50 MCG/ACT nasal spray Place 1 spray into both nostrils daily as needed for allergies or rhinitis.    Marland Kitchen gabapentin (NEURONTIN) 300 MG capsule Take 600 mg by mouth 3 (three) times daily.     Marland Kitchen HYDROcodone-acetaminophen (NORCO/VICODIN) 5-325 MG tablet Take 1 tablet by mouth every 6 (six) hours as needed for moderate pain.    Marland Kitchen insulin aspart (NOVOLOG) 100 UNIT/ML injection Inject 55-60 Units into the skin 3 (three) times daily before meals. 55 base Plus sliding scale    . insulin NPH (HUMULIN N,NOVOLIN N) 100 UNIT/ML injection Inject 75 Units into the skin 3 (three) times daily with meals. 75 units with breakfast; lunch 75 units; 75 units at dinner    . irbesartan (AVAPRO) 300 MG tablet Take 300 mg by mouth daily.     Marland Kitchen leflunomide (ARAVA) 20 MG tablet Take 20 mg by mouth daily.    . meclizine (ANTIVERT) 25 MG tablet Take 25 mg by mouth 3 (three) times daily as needed for dizziness.    . metFORMIN (GLUCOPHAGE-XR) 500 MG 24 hr tablet Take 500 mg by mouth every evening.    . Nebivolol HCl (BYSTOLIC) 20 MG TABS Take 20 mg by mouth daily.     Marland Kitchen neomycin-polymyxin-hydrocortisone (CORTISPORIN) 3.5-10000-1 OTIC suspension Place 3 drops into the right ear every  4 (four) hours. 10 mL 0  . pantoprazole (PROTONIX) 40 MG tablet Take 40 mg by mouth daily.    . predniSONE (DELTASONE) 50 MG tablet Take 1 tablet (50 mg total) by mouth daily. 3 tablet 0  . tiZANidine (ZANAFLEX) 4 MG tablet Take 4 mg by mouth 2 (two) times daily as needed (migraines). Limit 2 days per week    . tobramycin (TOBREX) 0.3 % ophthalmic ointment Place 1 application into the left eye 3 (three) times daily.    Marland Kitchen topiramate (TOPAMAX) 200 MG tablet Take 200 mg by mouth daily.    Marland Kitchen triamcinolone (NASACORT) 55 MCG/ACT AERO nasal inhaler Place 2 sprays into the nose daily. 1 Inhaler 0  . TRULICITY 9.56 OZ/3.0QM SOPN Inject 0.75 mg into the skin once a week. Patient takes on Wednesday.    . VOLTAREN 1 % GEL Apply  1 application topically 3 (three) times daily as needed (for pain).      No current facility-administered medications for this visit.     Functional Status:  In your present state of health, do you have any difficulty performing the following activities: 03/07/2017 01/30/2017  Hearing? Y N  Comment deaf in rt ear -  Vision? Y N  Comment patient needs new glasses/ recent appointment for scratch on cornea -  Difficulty concentrating or making decisions? N N  Walking or climbing stairs? N N  Dressing or bathing? N N  Doing errands, shopping? Tempie Donning  Preparing Food and eating ? N N  Using the Toilet? N N  In the past six months, have you accidently leaked urine? N N  Do you have problems with loss of bowel control? N N  Managing your Medications? N N  Managing your Finances? N N  Housekeeping or managing your Housekeeping? Y Y  Some recent data might be hidden    Fall/Depression Screening: Fall Risk  03/07/2017 01/30/2017 12/06/2016  Falls in the past year? No No No  Number falls in past yr: - - -  Risk for fall due to : Impaired balance/gait;Impaired mobility Impaired balance/gait;Impaired mobility Impaired balance/gait;Impaired mobility   PHQ 2/9 Scores 03/07/2017 01/30/2017 12/09/2016 12/06/2016 10/14/2016 08/26/2016 03/29/2016  PHQ - 2 Score 0 0 0 0 0 0 0   [facility] CM Care Plan Problem One     Most Recent Value  Care Plan Problem One  Knowledge Deficit in self management of diabetes  Role Documenting the Problem One  [place] for Problem One  Active  THN Long Term Goal   Patient will see a decrease in Hgb A1C within the next 90 days from 10.0  [facility]N Long Term Goal Start Date  03/07/17  Interventions for Problem One Long Term Goal  RN sent patient educational material on A1C. RN discussed with patient what the A1C means abd how the blood sugars affect the A1C.RN discussed what the fasting blood sugar should be to make the A1C below 7   [facility] CM Short Term Goal #1   Patient will report  checking into water aerobics within the next 30 days  [facility] CM Short Term Goal #1 Start Date  03/07/17  Interventions for Short Term Goal #1  RN discussed with patient the importance of weight loss. RN discussd classes available for medicaid and medicare recipients at the [facility]  [facility] CM Short Term Goal #2   Patient will report her fasting blood sugar is below 200 within the next 30 days  [facility]N CM Short  Term Goal #3 Start Date  03/07/17  Interventions for Short Tern Goal #3  RN discussed with patient the signs and symptoms of hypoglycemic reactions. RN reiterated with patient about action plan for reactions/ N discussed close monitoring and documentation with insulin dosages taken to discuss with physician. RN will follow up with additional discussion and teach back  [facility] CM Short Term Goal #4  Patient will be able to describe  healthy snack within the next 30 days  Interventions for Short Term Goal #4  RN sent a list of healthy snacks. RN will send a list of diabetic foods from fast food chain. RN will follow up discussion and teach back   [facility]N CM Short Term Goal #5   Patient will be able to describe foods low in carbohydrates within the next 30 days  THN CM Short Term Goal #5 Start Date  03/07/17  Interventions for Short Term Goal #5  RN sent EMMI educational material Diabetes food and exercise, Counting carbohydrates. RN sent a chart with high and low carbohydrates pictures. RN will follow up with discussion and teach back      Assessment:  Patient fasting blood sugar is 125 Patient had a cornea scratch Patient needs new eye glasses Patient needs a hearing aid Patient is having skin eruptions  Plan:  RN discussed healthy eating RN referred to pharmacy RN referred to social worker RN discussed health maintenance and follow up appointments RN will follow up outreach in the month of October.  [place] Care Management [phone]

## 2017-03-11 ENCOUNTER — Other Ambulatory Visit: Payer: Self-pay | Admitting: Licensed Clinical Social Worker

## 2017-03-11 DIAGNOSIS — M859 Disorder of bone density and structure, unspecified: Secondary | ICD-10-CM | POA: Diagnosis not present

## 2017-03-11 DIAGNOSIS — M069 Rheumatoid arthritis, unspecified: Secondary | ICD-10-CM | POA: Diagnosis not present

## 2017-03-11 DIAGNOSIS — M25561 Pain in right knee: Secondary | ICD-10-CM | POA: Diagnosis not present

## 2017-03-11 DIAGNOSIS — E7800 Pure hypercholesterolemia, unspecified: Secondary | ICD-10-CM | POA: Diagnosis not present

## 2017-03-11 DIAGNOSIS — E559 Vitamin D deficiency, unspecified: Secondary | ICD-10-CM | POA: Diagnosis not present

## 2017-03-11 DIAGNOSIS — M545 Low back pain: Secondary | ICD-10-CM | POA: Diagnosis not present

## 2017-03-11 DIAGNOSIS — R790 Abnormal level of blood mineral: Secondary | ICD-10-CM | POA: Diagnosis not present

## 2017-03-11 DIAGNOSIS — M150 Primary generalized (osteo)arthritis: Secondary | ICD-10-CM | POA: Diagnosis not present

## 2017-03-11 DIAGNOSIS — I5032 Chronic diastolic (congestive) heart failure: Secondary | ICD-10-CM | POA: Diagnosis not present

## 2017-03-11 DIAGNOSIS — R21 Rash and other nonspecific skin eruption: Secondary | ICD-10-CM | POA: Diagnosis not present

## 2017-03-11 DIAGNOSIS — Z23 Encounter for immunization: Secondary | ICD-10-CM | POA: Diagnosis not present

## 2017-03-11 DIAGNOSIS — Z79899 Other long term (current) drug therapy: Secondary | ICD-10-CM | POA: Diagnosis not present

## 2017-03-11 DIAGNOSIS — I10 Essential (primary) hypertension: Secondary | ICD-10-CM | POA: Diagnosis not present

## 2017-03-11 DIAGNOSIS — Z0000 Encounter for general adult medical examination without abnormal findings: Secondary | ICD-10-CM | POA: Diagnosis not present

## 2017-03-11 DIAGNOSIS — M8580 Other specified disorders of bone density and structure, unspecified site: Secondary | ICD-10-CM | POA: Diagnosis not present

## 2017-03-11 DIAGNOSIS — D649 Anemia, unspecified: Secondary | ICD-10-CM | POA: Diagnosis not present

## 2017-03-11 DIAGNOSIS — R7989 Other specified abnormal findings of blood chemistry: Secondary | ICD-10-CM | POA: Diagnosis not present

## 2017-03-11 NOTE — Patient Outreach (Signed)
[facility]) Care Management  03/11/2017  Jessica Howard 1961/03/15 [id number]  Assessment-CSW completed outreach attempt today after receiving referral for financial assistance. CSW unable to reach patient successfully. CSW left a HIPPA compliant voice message encouraging patient to return call once available.  Plan-CSW will await return call or complete an additional outreach if needed.  Eula Fried, BSW, MSW, [email or web address] Phone: [phone] Fax: [phone]

## 2017-03-13 ENCOUNTER — Ambulatory Visit: Payer: Medicare Other | Admitting: Podiatry

## 2017-03-13 ENCOUNTER — Other Ambulatory Visit: Payer: Self-pay | Admitting: Licensed Clinical Social Worker

## 2017-03-13 NOTE — Patient Outreach (Signed)
[facility] Care Management  03/13/2017  Jessica Howard 1961-04-07 [id number]  Assessment- CSW completed second outreach attempt and was able to successfully reach patient. HIPPA verifications were provided. CSW introduced self, reason for call and of [facility] social work services. Patient reports that she recently got an eye exam but cannot afford eyeglasses. CSW educated her on the [employer] which will allow her to get eyeglasses for only $25.00 since she has already got an eye exam. CSW will successfully place patient on wait list for [employer]. Patient is aware that she will not get a call until January of next year due to the wait list. Patient reports that she is need of hearing aides as well and that the application fee is $448.18. She reports that she is interested in gaining financial assistance resources. CSW spent time educating her on resources. Patient is already eligible for [place] because she has Medicaid. CSW informed her that she can provide patient with a $25.00 gift card to assist with gaining food so that she can save up her own money to gain hearing aides. Patient was appreciative of this. Patient denies having any depressive symptoms. She reports having transportation with [place] and [place]. CSW provided education on [facility]. Patient is interested in services. CSW will complete home visit tomorrow and provide patient with gift card and complete SCAT application.  [facility] CM Care Plan Problem One     Most Recent Value  Care Plan Problem One  Need to gain financial assistance and community resource information  Role Documenting the Problem One  Clinical Social Worker  Care Plan for Problem One  Active  [facility]N CM Short Term Goal #1   Patient will successfully apply for SCAT services and will schedule assessment appointment within 30 days  [facility] CM Short Term Goal #1 Start Date  03/13/17  Interventions for Short Term Goal #1   CSW provided education on transportation resources and patient is interested in [facility]. CSW will complete home visit and will complete SCAT application, fax it to SCAT and will monitor entire process  [facility]N CM Short Term Goal #2   Patient will gain financial assistance resources and assistance within 30 days  [facility] CM Short Term Goal #2 Start Date  03/13/17  Interventions for Short Term Goal #2  CSW will complete home visit and provide handout on financial resources. CSW will provide patient with a $25.00 gift card for her to use towards food so that she can use her money towards getting hearing aides and eye glasses      Plan-CSW will send involvement letter to PCP. CSW will complete home visit tomorrow.  Eula Fried, BSW, MSW, [email or web address] Phone: [phone] Fax: [phone]

## 2017-03-14 ENCOUNTER — Other Ambulatory Visit: Payer: Self-pay | Admitting: Licensed Clinical Social Worker

## 2017-03-14 NOTE — Patient Outreach (Signed)
[facility]) Care Management  [facility] Social Work  03/14/2017  Jessica Howard 1960-11-22 [id number]  Encounter Medications:  Outpatient Encounter Prescriptions as of 03/14/2017  Medication Sig Note  . abatacept (ORENCIA) 250 MG injection Inject 1,000 mg into the vein every 28 (twenty-eight) days. Monthly Infusion 10/09/2012: Monthly infusion in [place] at [facility].  Marland Kitchen albuterol (PROVENTIL HFA;VENTOLIN HFA) 108 (90 Base) MCG/ACT inhaler Inhale 2 puffs into the lungs every 6 (six) hours as needed for wheezing or shortness of breath.   Marland Kitchen amitriptyline (ELAVIL) 25 MG tablet Take 25 mg by mouth at bedtime.    . ARTIFICIAL TEAR OP Place 1 drop into both eyes as needed (for dry eyes).   . BYSTOLIC 10 MG tablet    . chlorthalidone (HYGROTON) 25 MG tablet Take 25 mg by mouth daily.   . CRESTOR 20 MG tablet Take 20 mg by mouth daily.    . cyclobenzaprine (FLEXERIL) 10 MG tablet    . diltiazem (CARDIZEM CD) 240 MG 24 hr capsule Take 240 mg by mouth daily.    . fluticasone (FLONASE) 50 MCG/ACT nasal spray Place 1 spray into both nostrils daily as needed for allergies or rhinitis.   Marland Kitchen gabapentin (NEURONTIN) 300 MG capsule Take 600 mg by mouth 3 (three) times daily.    Marland Kitchen HYDROcodone-acetaminophen (NORCO/VICODIN) 5-325 MG tablet Take 1 tablet by mouth every 6 (six) hours as needed for moderate pain.   Marland Kitchen insulin aspart (NOVOLOG) 100 UNIT/ML injection Inject 55-60 Units into the skin 3 (three) times daily before meals. 55 base Plus sliding scale   . insulin NPH (HUMULIN N,NOVOLIN N) 100 UNIT/ML injection Inject 75 Units into the skin 3 (three) times daily with meals. 75 units with breakfast; lunch 75 units; 75 units at dinner   . irbesartan (AVAPRO) 300 MG tablet Take 300 mg by mouth daily.    Marland Kitchen leflunomide (ARAVA) 20 MG tablet Take 20 mg by mouth daily.   . meclizine (ANTIVERT) 25 MG tablet Take 25 mg by mouth 3 (three) times daily as needed for dizziness.   . metFORMIN  (GLUCOPHAGE-XR) 500 MG 24 hr tablet Take 500 mg by mouth every evening.   . Nebivolol HCl (BYSTOLIC) 20 MG TABS Take 20 mg by mouth daily.    Marland Kitchen neomycin-polymyxin-hydrocortisone (CORTISPORIN) 3.5-10000-1 OTIC suspension Place 3 drops into the right ear every 4 (four) hours.   . pantoprazole (PROTONIX) 40 MG tablet Take 40 mg by mouth daily.   . predniSONE (DELTASONE) 50 MG tablet Take 1 tablet (50 mg total) by mouth daily.   Marland Kitchen tiZANidine (ZANAFLEX) 4 MG tablet Take 4 mg by mouth 2 (two) times daily as needed (migraines). Limit 2 days per week   . tobramycin (TOBREX) 0.3 % ophthalmic ointment Place 1 application into the left eye 3 (three) times daily.   Marland Kitchen topiramate (TOPAMAX) 200 MG tablet Take 200 mg by mouth daily.   Marland Kitchen triamcinolone (NASACORT) 55 MCG/ACT AERO nasal inhaler Place 2 sprays into the nose daily.   . TRULICITY 5.85 ID/7.8EU SOPN Inject 0.75 mg into the skin once a week. Patient takes on Wednesday.   . VOLTAREN 1 % GEL Apply 1 application topically 3 (three) times daily as needed (for pain).     No facility-administered encounter medications on file as of 03/14/2017.     Functional Status:  In your present state of health, do you have any difficulty performing the following activities: 03/07/2017 01/30/2017  Hearing? Y N  Comment deaf  in rt ear -  Vision? Y N  Comment patient needs new glasses/ recent appointment for scratch on cornea -  Difficulty concentrating or making decisions? N N  Walking or climbing stairs? N N  Dressing or bathing? N N  Doing errands, shopping? Tempie Donning  Preparing Food and eating ? N N  Using the Toilet? N N  In the past six months, have you accidently leaked urine? N N  Do you have problems with loss of bowel control? N N  Managing your Medications? N N  Managing your Finances? N N  Housekeeping or managing your Housekeeping? Tempie Donning  Some recent data might be hidden    Fall/Depression Screening:  PHQ 2/9 Scores 03/13/2017 03/07/2017 01/30/2017 12/09/2016  12/06/2016 10/14/2016 08/26/2016  PHQ - 2 Score 0 0 0 0 0 0 0    Assessment: CSW completed initial home visit on 03/14/17. Patient is requesting financial and transportation assistance. Patient is on Medicaid and on [address] but her rent is still $400 per month. Patient's daughter is a Equities trader in high school and resides with her. Patient receives $260 per month in child support. Daughter works part-time 2-3 days per week at [employer]. Patient is requesting financial resources. CSW completed review of all financial resources within [facility] but was not able to print documents out for patient as printer was not working. Patient is agreeable to CSW mailing out financial resources to her residence as well as food pantry information. CSW will complete another community resource review once resources have been mailed to patient. Patient was approved for [place] Program. She is unable to afford $250.00 for hearing aides or eyeglasses. CSW has placed patient successfully on the [place] wait list and patient will get a call in January of 2019. Eyeglasses will cost her $25.00. CSW provided patient with a $25.00 gift card for her to use towards food to offset her medical expenses. Patient reports that she has $75.00 saved up for her hearing aide and will work towards saving the $250.00 in order to pay for the application fee and gain hearing aides. Patient appreciative of gift card. Patient is interested in gaining stable transportation. CSW successfully completed SCAT application and will fax it to [place] office on 03/17/17.   Plan: CSW will route encounter to PCP. CSW will fax SCAT application by 76/2/26. CSW will send request to [place] Management Assistant to mail out financial resources, food pantries and where to receive a free meal daily within [facility].   Eula Fried, BSW, MSW, [email or web address] Phone:  [phone] Fax: [phone]

## 2017-03-17 ENCOUNTER — Other Ambulatory Visit: Payer: Self-pay | Admitting: Licensed Clinical Social Worker

## 2017-03-17 NOTE — Patient Outreach (Signed)
[place]) Care Management  03/17/2017  Jessica Howard 09-28-1960 [id number]  Assessment- CSW successfully faxed completed SC[facility] application to [facility] office on 03/17/17.  Plan-CSW will continue to provide social work assistance and will follow up with patient within two weeks to see if assessment appointment was scheduled.  Eula Fried, BSW, MSW, [email or web address] Phone: [phone] Fax: [phone]

## 2017-03-20 ENCOUNTER — Other Ambulatory Visit: Payer: Self-pay | Admitting: Pharmacist

## 2017-03-20 NOTE — Patient Outreach (Signed)
[facility]) Care Management  [place]   03/20/2017  Jessica Howard Mar 12, 1961 [id number]  Subjective:  Patient referred to [facility] Pharmacist by [facility] RN Joaquim Lai for medication review.    Successful phone outreach to patient---HIPAA details verified and patient agreed to review medications.  She reports being able to discussed medications over the phone.    Patient reports she lost hearing in her right ear.  She denies medication affordability concerns.   Her past medical history is significant for:  Hypertension, rheumatoid arthritis, dyslipidemia, thalassemia, diabetes.    Objective:   Current Medications: Current Outpatient Prescriptions  Medication Sig Dispense Refill  . abatacept (ORENCIA) 250 MG injection Inject 1,000 mg into the vein every 28 (twenty-eight) days. Monthly Infusion    . albuterol (PROVENTIL HFA;VENTOLIN HFA) 108 (90 Base) MCG/ACT inhaler Inhale 2 puffs into the lungs every 6 (six) hours as needed for wheezing or shortness of breath.    Marland Kitchen amitriptyline (ELAVIL) 25 MG tablet Take 25 mg by mouth at bedtime.     . ARTIFICIAL TEAR OP Place 1 drop into both eyes as needed (for dry eyes).    . BYSTOLIC 10 MG tablet     . chlorthalidone (HYGROTON) 25 MG tablet Take 25 mg by mouth daily.    . CRESTOR 20 MG tablet Take 20 mg by mouth daily.     . cyclobenzaprine (FLEXERIL) 10 MG tablet     . diltiazem (CARDIZEM CD) 240 MG 24 hr capsule Take 240 mg by mouth daily.     . fluticasone (FLONASE) 50 MCG/ACT nasal spray Place 1 spray into both nostrils daily as needed for allergies or rhinitis.    Marland Kitchen gabapentin (NEURONTIN) 300 MG capsule Take 600 mg by mouth 3 (three) times daily.     Marland Kitchen HYDROcodone-acetaminophen (NORCO/VICODIN) 5-325 MG tablet Take 1 tablet by mouth every 6 (six) hours as needed for moderate pain.    Marland Kitchen insulin aspart (NOVOLOG) 100 UNIT/ML injection Inject 55-60 Units into the skin 3 (three) times daily before meals. 55 base Plus  sliding scale    . insulin NPH (HUMULIN N,NOVOLIN N) 100 UNIT/ML injection Inject 75 Units into the skin 3 (three) times daily with meals. 75 units with breakfast; lunch 75 units; 75 units at dinner    . irbesartan (AVAPRO) 300 MG tablet Take 300 mg by mouth daily.     Marland Kitchen leflunomide (ARAVA) 20 MG tablet Take 20 mg by mouth daily.    . meclizine (ANTIVERT) 25 MG tablet Take 25 mg by mouth 3 (three) times daily as needed for dizziness.    . metFORMIN (GLUCOPHAGE-XR) 500 MG 24 hr tablet Take 500 mg by mouth every evening.    . Nebivolol HCl (BYSTOLIC) 20 MG TABS Take 20 mg by mouth daily.     Marland Kitchen neomycin-polymyxin-hydrocortisone (CORTISPORIN) 3.5-10000-1 OTIC suspension Place 3 drops into the right ear every 4 (four) hours. 10 mL 0  . pantoprazole (PROTONIX) 40 MG tablet Take 40 mg by mouth daily.    . predniSONE (DELTASONE) 50 MG tablet Take 1 tablet (50 mg total) by mouth daily. 3 tablet 0  . tiZANidine (ZANAFLEX) 4 MG tablet Take 4 mg by mouth 2 (two) times daily as needed (migraines). Limit 2 days per week    . tobramycin (TOBREX) 0.3 % ophthalmic ointment Place 1 application into the left eye 3 (three) times daily.    Marland Kitchen topiramate (TOPAMAX) 200 MG tablet Take 200 mg by mouth daily.    Marland Kitchen  triamcinolone (NASACORT) 55 MCG/ACT AERO nasal inhaler Place 2 sprays into the nose daily. 1 Inhaler 0  . TRULICITY 1.60 FU/9.3AT SOPN Inject 0.75 mg into the skin once a week. Patient takes on Wednesday.    . VOLTAREN 1 % GEL Apply 1 application topically 3 (three) times daily as needed (for pain).      No current facility-administered medications for this visit.     Functional Status: In your present state of health, do you have any difficulty performing the following activities: 03/07/2017 01/30/2017  Hearing? Y N  Comment deaf in rt ear -  Vision? Y N  Comment patient needs new glasses/ recent appointment for scratch on cornea -  Difficulty concentrating or making decisions? N N  Walking or climbing  stairs? N N  Dressing or bathing? N N  Doing errands, shopping? Tempie Donning  Preparing Food and eating ? N N  Using the Toilet? N N  In the past six months, have you accidently leaked urine? N N  Do you have problems with loss of bowel control? N N  Managing your Medications? N N  Managing your Finances? N N  Housekeeping or managing your Housekeeping? Y Y  Some recent data might be hidden    Fall/Depression Screening: Fall Risk  03/14/2017 03/07/2017 01/30/2017  Falls in the past year? No No No  Number falls in past yr: - - -  Risk for fall due to : - Impaired balance/gait;Impaired mobility Impaired balance/gait;Impaired mobility   PHQ 2/9 Scores 03/13/2017 03/07/2017 01/30/2017 12/09/2016 12/06/2016 10/14/2016 08/26/2016  PHQ - 2 Score 0 0 0 0 0 0 0    Assessment:  Medication review per patient report and medication list in this chart.   Drugs sorted by system:  Neurologic/Psychologic: -amitriptyline---patient reports not taking every night -gabapentin  -topiramate   Cardiovascular: -chlorthalidone -diltiazem  -irbesartan  -nebivolol---patient reports 10 mg tabs---2 tabs daily  -rosuvastatin   Pulmonary/Allergy: -albuterol  -fluticasone nasal spray   Gastrointestinal: -pantoprazole   Endocrine: -dulaglutide (Trulicity) -insulin aspart (Novolog)  -insulin NPH (Novolin N)  -metformin   Pain: -diclofenac gel  -hydrocodone/acetaminophen   Vitamins/Minerals: -vitamin D 50,000 units---reports she takes on Friday   -magnesium  Miscellaneous: -abatacept---patient reports on hold at this time  -leflunomide  -meclizine  -cyclobenzaprine as needed -tizanidine as needed  Duplications in therapy:  -cyclobenzaprine and tizanidine---increased risk of ADRs---patient reports not using tizanidine "in a while"  Drug interactions:  -abatacept and leflunomide---increased risk of hematologic toxicity---recommend routine monitoring for bone marrow suppression    Other issues noted:   -Patient reports no longer using prednisone, tobramycin eye ointment, triamcinolone nasal spray, or neomycin/polymyxin/hydrocortisone ear drops and these medications were removed from active medication list.    Patient counseling:  Patient asked if any medications can create skin rash.  She was counseled chlorthalidone can increase sun sensitivity.    Plan:  Will route note to PCP with medication review.    Will close pharmacy case as patient denies other pharmacy related needs at this time.    Karrie Meres, PharmD, [place] [phone]

## 2017-03-25 ENCOUNTER — Encounter: Payer: Self-pay | Admitting: Podiatry

## 2017-03-25 ENCOUNTER — Other Ambulatory Visit: Payer: Self-pay | Admitting: Licensed Clinical Social Worker

## 2017-03-25 ENCOUNTER — Ambulatory Visit (INDEPENDENT_AMBULATORY_CARE_PROVIDER_SITE_OTHER): Payer: Medicare Other | Admitting: Podiatry

## 2017-03-25 DIAGNOSIS — B351 Tinea unguium: Secondary | ICD-10-CM | POA: Diagnosis not present

## 2017-03-25 DIAGNOSIS — G609 Hereditary and idiopathic neuropathy, unspecified: Secondary | ICD-10-CM | POA: Diagnosis not present

## 2017-03-25 DIAGNOSIS — M79674 Pain in right toe(s): Secondary | ICD-10-CM

## 2017-03-25 DIAGNOSIS — E119 Type 2 diabetes mellitus without complications: Secondary | ICD-10-CM | POA: Diagnosis not present

## 2017-03-25 DIAGNOSIS — E1149 Type 2 diabetes mellitus with other diabetic neurological complication: Secondary | ICD-10-CM

## 2017-03-25 DIAGNOSIS — M79675 Pain in left toe(s): Secondary | ICD-10-CM

## 2017-03-25 DIAGNOSIS — M2142 Flat foot [pes planus] (acquired), left foot: Secondary | ICD-10-CM

## 2017-03-25 DIAGNOSIS — E7800 Pure hypercholesterolemia, unspecified: Secondary | ICD-10-CM | POA: Diagnosis not present

## 2017-03-25 DIAGNOSIS — E1140 Type 2 diabetes mellitus with diabetic neuropathy, unspecified: Secondary | ICD-10-CM

## 2017-03-25 DIAGNOSIS — I10 Essential (primary) hypertension: Secondary | ICD-10-CM | POA: Diagnosis not present

## 2017-03-25 DIAGNOSIS — M2141 Flat foot [pes planus] (acquired), right foot: Secondary | ICD-10-CM

## 2017-03-25 NOTE — Patient Outreach (Signed)
[place] [facility]) Care Management  03/25/2017  Jessica Howard 22-Sep-1960 [id number]  Assessment- CSW completed call to SCAT office to follow up on application. CSW was unable to reach anyone but left a detailed voice message requesting a return call with updates on patient's application status.  Plan-CSW will await for return call or complete another attempt if needed.  Eula Fried, BSW, MSW, [email or web address] Phone: [phone] Fax: [phone]

## 2017-03-25 NOTE — Progress Notes (Addendum)
Complaint:  Visit Type: Patient returns to my office for continued preventative foot care services. Complaint: Patient states" my nails have grown long and thick and become painful to walk and wear shoes" Patient has been diagnosed with DM with neuropathy.  Patient likes her diabetic shoes.. The patient presents for preventative foot care services. No changes to ROS  Podiatric Exam: Vascular: dorsalis pedis  pulses are palpable bilateral. Posterior tibial pulses are non palpable due to swelling feet  B/L. Capillary return is immediate. Temperature gradient is WNL. Skin turgor WNL  Sensorium: Normal Semmes Weinstein monofilament test. Normal tactile sensation bilaterally. Nail Exam: Pt has thick disfigured discolored nails with subungual debris noted bilateral entire nail hallux through fifth toenails Ulcer Exam: There is no evidence of ulcer or pre-ulcerative changes or infection. Orthopedic Exam: Muscle tone and strength are WNL. No limitations in general ROM. No crepitus or effusions noted. Foot type and digits show no abnormalities. Bony prominences are unremarkable. Pes planus. Skin: No Porokeratosis. No infection or ulcers.  Painful skin lesion fifth toe left foot.  Diagnosis:  Onychomycosis, , Pain in right toe, pain in left toes  Treatment & Plan Procedures and Treatment: Consent by patient was obtained for treatment procedures.   Debridement of mycotic and hypertrophic toenails, 1 through 5 bilateral and clearing of subungual debris. No ulceration, no infection noted.  Return Visit-Office Procedure: Patient instructed to return to the office for a follow up visit 3 months for continued evaluation and treatment.    Gardiner Barefoot DPM

## 2017-03-26 ENCOUNTER — Other Ambulatory Visit: Payer: Self-pay | Admitting: Licensed Clinical Social Worker
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
## 2017-03-27 DIAGNOSIS — Z83511 Family history of glaucoma: Secondary | ICD-10-CM | POA: Diagnosis not present

## 2017-03-27 DIAGNOSIS — H40053 Ocular hypertension, bilateral: Secondary | ICD-10-CM | POA: Diagnosis not present

## 2017-03-27 DIAGNOSIS — H40023 Open angle with borderline findings, high risk, bilateral: Secondary | ICD-10-CM | POA: Diagnosis not present

## 2017-04-01 DIAGNOSIS — L709 Acne, unspecified: Secondary | ICD-10-CM | POA: Diagnosis not present

## 2017-04-01 DIAGNOSIS — L739 Follicular disorder, unspecified: Secondary | ICD-10-CM | POA: Diagnosis not present

## 2017-04-03 ENCOUNTER — Other Ambulatory Visit: Payer: Self-pay | Admitting: Licensed Clinical Social Worker

## 2017-04-03 NOTE — Patient Outreach (Signed)
[place] [facility]) Care Management  04/03/2017  Jessica Howard 1960-10-17 [id number]  Assessment- CSW completed outreach call to patient to remind her of her upcoming SCAT assessment. Patient answered and was appreciative of update. She reports suffering from knee pain today but that it is "managable." She reports that she went to her dermatologist on 04/01/17 and that they have decided to prescribe her accutane. Patient reports that the dermatologist is having to confirm some of her medical information before she can take medication. CSW encouraged patient to consider getting moisturizer for dry skin and informed her that they have products at [employer] for $1 as patient will have some reactions to the accutane medication. Patient is agreeable to look into this.  Plan-CSW will follow up within two weeks.  Eula Fried, BSW, MSW, [email or web address] Phone: [phone] Fax: [phone]

## 2017-04-04 ENCOUNTER — Other Ambulatory Visit (HOSPITAL_BASED_OUTPATIENT_CLINIC_OR_DEPARTMENT_OTHER): Payer: Medicare Other

## 2017-04-04 ENCOUNTER — Ambulatory Visit: Payer: Medicare Other

## 2017-04-04 DIAGNOSIS — N189 Chronic kidney disease, unspecified: Secondary | ICD-10-CM | POA: Diagnosis present

## 2017-04-04 DIAGNOSIS — G4733 Obstructive sleep apnea (adult) (pediatric): Secondary | ICD-10-CM | POA: Diagnosis not present

## 2017-04-04 DIAGNOSIS — M069 Rheumatoid arthritis, unspecified: Secondary | ICD-10-CM | POA: Diagnosis not present

## 2017-04-04 DIAGNOSIS — I5032 Chronic diastolic (congestive) heart failure: Secondary | ICD-10-CM | POA: Diagnosis not present

## 2017-04-04 DIAGNOSIS — R600 Localized edema: Secondary | ICD-10-CM | POA: Diagnosis not present

## 2017-04-04 DIAGNOSIS — D500 Iron deficiency anemia secondary to blood loss (chronic): Secondary | ICD-10-CM | POA: Diagnosis not present

## 2017-04-04 DIAGNOSIS — G609 Hereditary and idiopathic neuropathy, unspecified: Secondary | ICD-10-CM | POA: Diagnosis not present

## 2017-04-04 DIAGNOSIS — E7800 Pure hypercholesterolemia, unspecified: Secondary | ICD-10-CM | POA: Diagnosis not present

## 2017-04-04 DIAGNOSIS — M8580 Other specified disorders of bone density and structure, unspecified site: Secondary | ICD-10-CM | POA: Diagnosis not present

## 2017-04-04 DIAGNOSIS — D569 Thalassemia, unspecified: Secondary | ICD-10-CM

## 2017-04-04 DIAGNOSIS — E10319 Type 1 diabetes mellitus with unspecified diabetic retinopathy without macular edema: Secondary | ICD-10-CM | POA: Diagnosis not present

## 2017-04-04 DIAGNOSIS — D563 Thalassemia minor: Secondary | ICD-10-CM | POA: Diagnosis not present

## 2017-04-04 DIAGNOSIS — D631 Anemia in chronic kidney disease: Secondary | ICD-10-CM | POA: Diagnosis not present

## 2017-04-04 DIAGNOSIS — E1042 Type 1 diabetes mellitus with diabetic polyneuropathy: Secondary | ICD-10-CM | POA: Diagnosis not present

## 2017-04-04 DIAGNOSIS — D509 Iron deficiency anemia, unspecified: Secondary | ICD-10-CM

## 2017-04-04 DIAGNOSIS — I10 Essential (primary) hypertension: Secondary | ICD-10-CM | POA: Diagnosis not present

## 2017-04-04 LAB — CBC WITH DIFFERENTIAL/PLATELET
BASO%: 0.3 % (ref 0.0–2.0)
Basophils Absolute: 0 10*3/uL (ref 0.0–0.1)
EOS ABS: 0.1 10*3/uL (ref 0.0–0.5)
EOS%: 1.8 % (ref 0.0–7.0)
HCT: 33.1 % — ABNORMAL LOW (ref 34.8–46.6)
HGB: 10.3 g/dL — ABNORMAL LOW (ref 11.6–15.9)
LYMPH%: 42.2 % (ref 14.0–49.7)
MCH: 20.4 pg — ABNORMAL LOW (ref 25.1–34.0)
MCHC: 31.1 g/dL — AB (ref 31.5–36.0)
MCV: 65.7 fL — ABNORMAL LOW (ref 79.5–101.0)
MONO#: 0.9 10*3/uL (ref 0.1–0.9)
MONO%: 13.9 % (ref 0.0–14.0)
NEUT%: 41.8 % (ref 38.4–76.8)
NEUTROS ABS: 2.8 10*3/uL (ref 1.5–6.5)
PLATELETS: 253 10*3/uL (ref 145–400)
RBC: 5.04 10*6/uL (ref 3.70–5.45)
RDW: 17.3 % — AB (ref 11.2–14.5)
WBC: 6.6 10*3/uL (ref 3.9–10.3)
lymph#: 2.8 10*3/uL (ref 0.9–3.3)
nRBC: 0 % (ref 0–0)

## 2017-04-04 LAB — IRON AND TIBC
%SAT: 33 % (ref 21–57)
Iron: 87 ug/dL (ref 41–142)
TIBC: 263 ug/dL (ref 236–444)
UIBC: 175 ug/dL (ref 120–384)

## 2017-04-04 LAB — FERRITIN: FERRITIN: 464 ng/mL — AB (ref 9–269)

## 2017-04-04 NOTE — Progress Notes (Signed)
Hemoglobin noted at 10.3, no injection needed  Per protocol. Pt given copy of labs and current schedule.

## 2017-04-07 ENCOUNTER — Ambulatory Visit: Payer: Self-pay | Admitting: *Deleted

## 2017-04-08 ENCOUNTER — Ambulatory Visit (INDEPENDENT_AMBULATORY_CARE_PROVIDER_SITE_OTHER): Payer: Medicare Other | Admitting: Ophthalmology

## 2017-04-09 ENCOUNTER — Encounter (INDEPENDENT_AMBULATORY_CARE_PROVIDER_SITE_OTHER): Payer: Medicare Other | Admitting: Ophthalmology

## 2017-04-09 DIAGNOSIS — H43813 Vitreous degeneration, bilateral: Secondary | ICD-10-CM

## 2017-04-09 DIAGNOSIS — H35033 Hypertensive retinopathy, bilateral: Secondary | ICD-10-CM

## 2017-04-09 DIAGNOSIS — E113393 Type 2 diabetes mellitus with moderate nonproliferative diabetic retinopathy without macular edema, bilateral: Secondary | ICD-10-CM

## 2017-04-09 DIAGNOSIS — I10 Essential (primary) hypertension: Secondary | ICD-10-CM | POA: Diagnosis not present

## 2017-04-09 DIAGNOSIS — E11319 Type 2 diabetes mellitus with unspecified diabetic retinopathy without macular edema: Secondary | ICD-10-CM | POA: Diagnosis not present

## 2017-04-10 ENCOUNTER — Other Ambulatory Visit: Payer: Self-pay | Admitting: Licensed Clinical Social Worker

## 2017-04-10 NOTE — Patient Outreach (Signed)
[place] [facility]) Care Management  04/10/2017  Jessica Howard 1961-04-04 [id number]  Assessment- CSW completed call to patient and she answered. She reports that she completed her in person interview with SCAT and was approved for services. She can now use SCAT and understands how to arrange transportation rides. All social work goals have been met as patient has successfully gained Building survey[employer], financial assistance through [employer] and also now has stable transportation. Patient is agreeable to social work discharge.  Plan-CSW will update [place] and PCP.  Eula Fried, BSW, MSW, [email or web address] Phone: [phone] Fax: [phone]

## 2017-04-15 DIAGNOSIS — H40053 Ocular hypertension, bilateral: Secondary | ICD-10-CM | POA: Diagnosis not present

## 2017-04-15 DIAGNOSIS — H40023 Open angle with borderline findings, high risk, bilateral: Secondary | ICD-10-CM | POA: Diagnosis not present

## 2017-04-15 DIAGNOSIS — Z83511 Family history of glaucoma: Secondary | ICD-10-CM | POA: Diagnosis not present

## 2017-04-22 ENCOUNTER — Encounter (HOSPITAL_COMMUNITY)
Admission: RE | Admit: 2017-04-22 | Discharge: 2017-04-22 | Disposition: A | Discharge status: Home / Self Care | Payer: Medicare Other | Source: Ambulatory Visit | Attending: Rheumatology | Admitting: Rheumatology

## 2017-04-22 DIAGNOSIS — M069 Rheumatoid arthritis, unspecified: Secondary | ICD-10-CM | POA: Diagnosis not present

## 2017-04-22 MED ORDER — SODIUM CHLORIDE 0.9 % IV SOLN
1000.0000 mg | INTRAVENOUS | Status: DC
  Administered 2017-04-22: 09:00:00 1000 mg via INTRAVENOUS
  Filled 2017-04-22: qty 40

## 2017-04-22 MED ORDER — SODIUM CHLORIDE 0.9 % IV SOLN
INTRAVENOUS | Status: DC
  Administered 2017-04-22: 09:00:00 via INTRAVENOUS

## 2017-04-23 ENCOUNTER — Other Ambulatory Visit: Payer: Medicare Other

## 2017-04-23 ENCOUNTER — Ambulatory Visit: Payer: Medicare Other

## 2017-04-24 DIAGNOSIS — L709 Acne, unspecified: Secondary | ICD-10-CM | POA: Diagnosis not present

## 2017-05-02 ENCOUNTER — Other Ambulatory Visit: Payer: Medicare Other

## 2017-05-02 ENCOUNTER — Ambulatory Visit: Payer: Medicare Other

## 2017-05-02 ENCOUNTER — Ambulatory Visit: Payer: Medicare Other | Admitting: Oncology

## 2017-05-06 ENCOUNTER — Other Ambulatory Visit: Payer: Self-pay | Admitting: Internal Medicine

## 2017-05-06 DIAGNOSIS — Z139 Encounter for screening, unspecified: Secondary | ICD-10-CM

## 2017-05-07 ENCOUNTER — Other Ambulatory Visit: Payer: Self-pay | Admitting: *Deleted

## 2017-05-07 NOTE — Patient Outreach (Signed)
[place] [facility]) Care Management  05/07/2017   Jessica Howard 16-Jun-1960 [id number]  Subjective:  RN Health Coach telephone call to patient.  [employer] compliance verified. Per patient her fasting blood sugar today was 125. Patient A1C is 7.3 as of 03/11/2017 Patient has not had any recent falls. Patient is follow up with maintenance care. Patient had a full eye exam in September and had a diabetic retinopathy exam in October. Patient has not started water aerobics. Per patient she wants to go to the [place] recreation center that is close to her home but they do not accept clients until age 44. Patient stated that she will try to go in Feb. Meanwhile patient has seen a chair exercise program on television. Patient stated she does it once in a while. RN discussed with patient about a routine at least 3 times a week. Patient has gotten her flu shot for the year. Patient has had her hearing tested.  Patient has agreed to follow up outreach calls.   Current Medications:  Current Outpatient Medications  Medication Sig Dispense Refill  . abatacept (ORENCIA) 250 MG injection Inject 1,000 mg into the vein every 28 (twenty-eight) days. Monthly Infusion    . albuterol (PROVENTIL HFA;VENTOLIN HFA) 108 (90 Base) MCG/ACT inhaler Inhale 2 puffs into the lungs every 6 (six) hours as needed for wheezing or shortness of breath.    Marland Kitchen amitriptyline (ELAVIL) 25 MG tablet Take 25 mg by mouth at bedtime.     . ARTIFICIAL TEAR OP Place 1 drop into both eyes as needed (for dry eyes).    . BYSTOLIC 10 MG tablet Take 20 mg by mouth daily.     . chlorthalidone (HYGROTON) 25 MG tablet Take 25 mg by mouth daily.    . CRESTOR 20 MG tablet Take 20 mg by mouth daily.     . cyclobenzaprine (FLEXERIL) 10 MG tablet Take 10 mg by mouth 3 (three) times daily as needed.     . diltiazem (CARDIZEM CD) 240 MG 24 hr capsule Take 240 mg by mouth daily.     . fluticasone (FLONASE) 50 MCG/ACT nasal spray Place 1 spray into  both nostrils daily as needed for allergies or rhinitis.    Marland Kitchen gabapentin (NEURONTIN) 300 MG capsule Take 600 mg by mouth 3 (three) times daily.     Marland Kitchen HYDROcodone-acetaminophen (NORCO/VICODIN) 5-325 MG tablet Take 1 tablet by mouth every 6 (six) hours as needed for moderate pain.    Marland Kitchen insulin aspart (NOVOLOG) 100 UNIT/ML injection Inject 55-60 Units into the skin 3 (three) times daily before meals. 55 base Plus sliding scale    . insulin NPH (HUMULIN N,NOVOLIN N) 100 UNIT/ML injection Inject 75 Units into the skin 3 (three) times daily with meals. 75 units with breakfast; lunch 75 units; 75 units at dinner    . irbesartan (AVAPRO) 300 MG tablet Take 300 mg by mouth daily.     Marland Kitchen leflunomide (ARAVA) 20 MG tablet Take 20 mg by mouth daily.    . magnesium oxide (MAG-OX) 400 MG tablet Take 400 mg by mouth daily.    . meclizine (ANTIVERT) 25 MG tablet Take 25 mg by mouth 3 (three) times daily as needed for dizziness.    . metFORMIN (GLUCOPHAGE-XR) 500 MG 24 hr tablet Take 500 mg by mouth every evening.    . pantoprazole (PROTONIX) 40 MG tablet Take 40 mg by mouth daily.    Marland Kitchen tiZANidine (ZANAFLEX) 4 MG tablet Take 4 mg by mouth  2 (two) times daily as needed (migraines). Limit 2 days per week    . topiramate (TOPAMAX) 200 MG tablet Take 200 mg by mouth daily.    . TRULICITY 2.29 NL/8.9QJ SOPN Inject 0.75 mg into the skin once a week. Patient takes on Wednesday.    . Vitamin D, Ergocalciferol, (DRISDOL) 50000 units CAPS capsule Take 50,000 Units by mouth every 7 (seven) days.    . VOLTAREN 1 % GEL Apply 1 application topically 3 (three) times daily as needed (for pain).      No current facility-administered medications for this visit.     Functional Status:  In your present state of health, do you have any difficulty performing the following activities: 05/07/2017 03/07/2017  Hearing? Y Y  Comment - deaf in rt ear  Vision? N Y  Comment - patient needs new glasses/ recent appointment for scratch on  cornea  Difficulty concentrating or making decisions? N N  Walking or climbing stairs? N N  Dressing or bathing? N N  Doing errands, shopping? Tempie Donning  Preparing Food and eating ? N N  Using the Toilet? N N  In the past six months, have you accidently leaked urine? N N  Do you have problems with loss of bowel control? N N  Managing your Medications? N N  Managing your Finances? N N  Housekeeping or managing your Housekeeping? Y Y  Some recent data might be hidden    Fall/Depression Screening: Fall Risk  05/07/2017 03/14/2017 03/07/2017  Falls in the past year? No No No  Number falls in past yr: - - -  Risk for fall due to : Impaired balance/gait;Impaired mobility - Impaired balance/gait;Impaired mobility   PHQ 2/9 Scores 05/07/2017 03/13/2017 03/07/2017 01/30/2017 12/09/2016 12/06/2016 10/14/2016  PHQ - 2 Score 0 0 0 0 0 0 0   THN CM Care Plan Problem One     Most Recent Value  Care Plan Problem One  Knowledge Deficit in self management of diabetes  Role Documenting the Problem One  Health Coach  [place] Term Goal   Patient will see a decrease in Hgb A1C within the next 90 days from 10.0  THN Long Term Goal Start Date  05/07/17  Interventions for Problem One Long Term Goal  RN sent patient educational material on A1C. RN discussed with patient what the A1C means abd how the blood sugars affect the A1C.RN discussed what the fasting blood sugar should be to make the A1C below 7   THN CM Short Term Goal #1   Patient will report checking into water aerobics within the next 30 days  THN CM Short Term Goal #1 Start Date  05/07/17  Interventions for Short Term Goal #1  RN discussed with patient the importance of weight lossand exercise. Patient checked on medicaid and medicare recipients classses at the [facility]. Patient wants to go to [place] [facility] but has to wait until age 37 in February. RN will follow up with further discussion   THN CM Short Term Goal #2   Patient will report that she has  developed a routine chair exercise program with 30 days  THN CM Short Term Goal #2 Start Date  05/07/17  Interventions for Short Term Goal #2  RN discussed with patient about a need for a routine exercise program.Patient has found a chair exercise program on tv. RN will follow up with patient for adherence and discussion.    THN CM Short Term Goal #3  Patient  will not have any hypoglycemic reactons within the next 30 days  Interventions for Short Tern Goal #3  RN discussed with patient the signs and symptoms of hypoglycemic reactions. RN reiterated with patient about action plan for reactions/ N discussed close monitoring and documentation with insulin dosages taken to discuss with physician. RN will follow up with additional discussion and teach back  THN CM Short Term Goal #4  Patient will be able to describe  healthy snack within the next 30 days  THN CM Short Term Goal #4 Start Date  05/07/17  Interventions for Short Term Goal #4  Patient has a list of list of healthy snacks and  a list of diabetic foods from fast food chain. RN reiterates mainiting her diet. RN follows up with support and discussion   THN CM Short Term Goal #5   Patient will be able to describe foods low in carbohydrates and fats within the next 30 days  THN CM Short Term Goal #5 Start Date  05/07/17  Interventions for Short Term Goal #5  Patient has received the [facility] educational material Diabetes food and  counting carbohydrates. RN discussed decrease fats by baking, decreasing butter and reading labels. RN sent educational material on triglycerides and about eating food lower in triglycerides. RN sent educational material about what a lipid means, RN sent educational material on Heart disease prevention. RN will follow up with discussion and teach back.rt with high and low carbohydrates pictures. RN will follow up with discussion and teach back      Assessment:  Fasting blood sugar today was 125  A1C is 7.3 Triglycerides are  228.0 Patient has not started water aerobics Patient has not started a routine exercise program Patient will continue to  benefit from [place] telephonic outreach for education and support for diabetes self management.  Plan:  RN sent educational material on High Triglyceride reduction eating plan RN sent educational material on Understanding Lipid profile tests RN sent educational information on Diabetes and exercise RN sent educational material on Heart Disease prevention RN sent 2019 Calendar book for document blood sugars and weight RN will follow up within the month of December  Undrea Shipes [place] Management [phone]

## 2017-05-15 ENCOUNTER — Encounter: Payer: Self-pay | Admitting: *Deleted

## 2017-05-15 ENCOUNTER — Other Ambulatory Visit: Payer: Self-pay | Admitting: Oncology

## 2017-05-15 DIAGNOSIS — D509 Iron deficiency anemia, unspecified: Secondary | ICD-10-CM

## 2017-05-16 ENCOUNTER — Ambulatory Visit (HOSPITAL_BASED_OUTPATIENT_CLINIC_OR_DEPARTMENT_OTHER): Payer: Medicare Other | Admitting: Oncology

## 2017-05-16 ENCOUNTER — Telehealth: Payer: Self-pay | Admitting: Oncology

## 2017-05-16 ENCOUNTER — Ambulatory Visit (HOSPITAL_BASED_OUTPATIENT_CLINIC_OR_DEPARTMENT_OTHER): Payer: Medicare Other

## 2017-05-16 ENCOUNTER — Other Ambulatory Visit (HOSPITAL_COMMUNITY): Payer: Self-pay | Admitting: *Deleted

## 2017-05-16 ENCOUNTER — Other Ambulatory Visit (HOSPITAL_BASED_OUTPATIENT_CLINIC_OR_DEPARTMENT_OTHER): Payer: Medicare Other

## 2017-05-16 VITALS — BP 165/87 | HR 83

## 2017-05-16 VITALS — BP 186/77 | HR 88 | Temp 98.4°F | Resp 18 | Ht 63.0 in | Wt 266.5 lb

## 2017-05-16 DIAGNOSIS — D631 Anemia in chronic kidney disease: Secondary | ICD-10-CM | POA: Diagnosis not present

## 2017-05-16 DIAGNOSIS — M069 Rheumatoid arthritis, unspecified: Secondary | ICD-10-CM

## 2017-05-16 DIAGNOSIS — D508 Other iron deficiency anemias: Secondary | ICD-10-CM

## 2017-05-16 DIAGNOSIS — N189 Chronic kidney disease, unspecified: Secondary | ICD-10-CM

## 2017-05-16 DIAGNOSIS — D509 Iron deficiency anemia, unspecified: Secondary | ICD-10-CM

## 2017-05-16 DIAGNOSIS — N289 Disorder of kidney and ureter, unspecified: Secondary | ICD-10-CM

## 2017-05-16 LAB — CBC WITH DIFFERENTIAL/PLATELET
BASO%: 0.4 % (ref 0.0–2.0)
Basophils Absolute: 0 10*3/uL (ref 0.0–0.1)
EOS%: 1.9 % (ref 0.0–7.0)
Eosinophils Absolute: 0.1 10*3/uL (ref 0.0–0.5)
HCT: 30.4 % — ABNORMAL LOW (ref 34.8–46.6)
HGB: 9.4 g/dL — ABNORMAL LOW (ref 11.6–15.9)
LYMPH%: 40.6 % (ref 14.0–49.7)
MCH: 19.9 pg — ABNORMAL LOW (ref 25.1–34.0)
MCHC: 30.9 g/dL — ABNORMAL LOW (ref 31.5–36.0)
MCV: 64.4 fL — ABNORMAL LOW (ref 79.5–101.0)
MONO#: 0.8 10*3/uL (ref 0.1–0.9)
MONO%: 10.7 % (ref 0.0–14.0)
NEUT%: 46.4 % (ref 38.4–76.8)
NEUTROS ABS: 3.5 10*3/uL (ref 1.5–6.5)
Platelets: 266 10*3/uL (ref 145–400)
RBC: 4.72 10*6/uL (ref 3.70–5.45)
RDW: 17.4 % — ABNORMAL HIGH (ref 11.2–14.5)
WBC: 7.6 10*3/uL (ref 3.9–10.3)
lymph#: 3.1 10*3/uL (ref 0.9–3.3)
nRBC: 0 % (ref 0–0)

## 2017-05-16 MED ORDER — DARBEPOETIN ALFA 300 MCG/0.6ML IJ SOSY
300.0000 ug | PREFILLED_SYRINGE | Freq: Once | INTRAMUSCULAR | Status: AC
  Administered 2017-05-16: 300 ug via SUBCUTANEOUS
  Filled 2017-05-16: qty 0.6

## 2017-05-16 NOTE — Progress Notes (Signed)
Hematology and Oncology Follow Up Visit  Jessica Howard [id number] 02-12-1961 56 y.o. 05/16/2017 10:28 AM   Principle Diagnosis: 56 year old female with anemia of renal disease. She has also microcytic anemia due to beta thalassemia. She also has element of anemia of chronic disease with history of rheumatoid arthritis.  Current therapy: Aranesp 300 g every 4 weeks to keep her hemoglobin above 10.  Interim History:  Jessica Howard presents today for a follow-up visit. Since her last visit, she reports no changes in her health.  Overall has been doing reasonably well although she is dealing with acne and planning to start treatment for that in the near future. She denied any hematochezia or melena. She denied any epistaxis or abdominal distention. She denied any recent illnesses or hospitalizations.   She continues to receive Aranesp injections without complications. She denied any thrombosis or bleeding episodes. She denied any shortness of breath or difficulty breathing.  He does report improvement in her performance status associated with it.  She did not report headaches or blurry vision or syncope. She did not report any chest pain or palpitation. She is not reporting nausea, vomiting, abdominal pain. She does not report any hematochezia or bleeding per rectum. Does not report any frequency urgency or hesitancy. She does not report any hematuria hematochezia or melena. Remainder of her review of systems unremarkable.   Medications: I have reviewed the patient's current medications.  Current Outpatient Medications  Medication Sig Dispense Refill  . abatacept (ORENCIA) 250 MG injection Inject 1,000 mg into the vein every 28 (twenty-eight) days. Monthly Infusion    . albuterol (PROVENTIL HFA;VENTOLIN HFA) 108 (90 Base) MCG/ACT inhaler Inhale 2 puffs into the lungs every 6 (six) hours as needed for wheezing or shortness of breath.    Marland Kitchen amitriptyline (ELAVIL) 25 MG tablet Take 25 mg by mouth  at bedtime.     . ARTIFICIAL TEAR OP Place 1 drop into both eyes as needed (for dry eyes).    . BYSTOLIC 10 MG tablet Take 20 mg by mouth daily.     . chlorthalidone (HYGROTON) 25 MG tablet Take 25 mg by mouth daily.    . CRESTOR 20 MG tablet Take 20 mg by mouth daily.     . cyclobenzaprine (FLEXERIL) 10 MG tablet Take 10 mg by mouth 3 (three) times daily as needed.     . diltiazem (CARDIZEM CD) 240 MG 24 hr capsule Take 240 mg by mouth daily.     . fluticasone (FLONASE) 50 MCG/ACT nasal spray Place 1 spray into both nostrils daily as needed for allergies or rhinitis.    Marland Kitchen gabapentin (NEURONTIN) 300 MG capsule Take 600 mg by mouth 3 (three) times daily.     Marland Kitchen HYDROcodone-acetaminophen (NORCO/VICODIN) 5-325 MG tablet Take 1 tablet by mouth every 6 (six) hours as needed for moderate pain.    Marland Kitchen insulin aspart (NOVOLOG) 100 UNIT/ML injection Inject 55-60 Units into the skin 3 (three) times daily before meals. 55 base Plus sliding scale    . insulin NPH (HUMULIN N,NOVOLIN N) 100 UNIT/ML injection Inject 75 Units into the skin 3 (three) times daily with meals. 75 units with breakfast; lunch 75 units; 75 units at dinner    . irbesartan (AVAPRO) 300 MG tablet Take 300 mg by mouth daily.     Marland Kitchen leflunomide (ARAVA) 20 MG tablet Take 20 mg by mouth daily.    . magnesium oxide (MAG-OX) 400 MG tablet Take 400 mg by mouth daily.    Marland Kitchen  meclizine (ANTIVERT) 25 MG tablet Take 25 mg by mouth 3 (three) times daily as needed for dizziness.    . metFORMIN (GLUCOPHAGE-XR) 500 MG 24 hr tablet Take 500 mg by mouth every evening.    . pantoprazole (PROTONIX) 40 MG tablet Take 40 mg by mouth daily.    Marland Kitchen tiZANidine (ZANAFLEX) 4 MG tablet Take 4 mg by mouth 2 (two) times daily as needed (migraines). Limit 2 days per week    . topiramate (TOPAMAX) 200 MG tablet Take 200 mg by mouth daily.    . TRULICITY 1.19 ER/7.4YC SOPN Inject 0.75 mg into the skin once a week. Patient takes on Wednesday.    . Vitamin D, Ergocalciferol,  (DRISDOL) 50000 units CAPS capsule Take 50,000 Units by mouth every 7 (seven) days.    . VOLTAREN 1 % GEL Apply 1 application topically 3 (three) times daily as needed (for pain).      No current facility-administered medications for this visit.     Allergies:  Allergies  Allergen Reactions  . Bactrim [Sulfamethoxazole-Trimethoprim] Hives  . Cefuroxime Axetil Itching  . Cephalosporins Itching  . Iohexol Itching and Rash     Code: RASH, Desc: HAD ITCHING AND A RASH ABOUT ONE HOUR AFTER RETURNING HOME FROM THE CT, Onset Date: [id number]   . Lisinopril Cough  . Penicillins Hives  . Sulfa Antibiotics Hives    Past Medical History, Surgical history, Social history, and Family History were reviewed and updated.   Blood pressure (!) 186/77, pulse 88, temperature 98.4 F (36.9 C), temperature source Oral, resp. rate 18, height 5\' 3"  (1.6 m), weight 266 lb 8 oz (120.9 kg), last menstrual period 12/27/2013, SpO2 98 %. ECOG: 1 General appearance: Well-appearing woman without distress. Head: Normocephalic, without obvious abnormality. No oral ulcers or lesions. Neck: no adenopathy or masses. Lymph nodes: Cervical, supraclavicular, and axillary nodes normal. Heart:regular rate and rhythm, S1, S2 normal, no murmur, click, rub or gallop Lung:chest clear, no wheezing, rales, normal symmetric air entry Abdomin: soft, non-tender, without masses or organomegaly. No shifting dullness or ascites. EXT: Mild edema noted in her lower extremities.    CBC    Component Value Date/Time   WBC 7.6 05/16/2017 0937   WBC 11.9 (H) 01/19/2016 1350   RBC 4.72 05/16/2017 0937   RBC 5.08 01/19/2016 1350   HGB 9.4 (L) 05/16/2017 0937   HCT 30.4 (L) 05/16/2017 0937   PLT 266 05/16/2017 0937   MCV 64.4 (L) 05/16/2017 0937   MCH 19.9 (L) 05/16/2017 0937   MCH 20.1 (L) 01/19/2016 1350   MCHC 30.9 (L) 05/16/2017 0937   MCHC 30.1 01/19/2016 1350   RDW 17.4 (H) 05/16/2017 0937   LYMPHSABS 3.1 05/16/2017 0937    MONOABS 0.8 05/16/2017 0937   EOSABS 0.1 05/16/2017 0937   BASOSABS 0.0 05/16/2017 0937       Impression and Plan:  56 year old female with the following issues:   1. Anemia of renal insufficiency: Her hemoglobin today is 10.3 and we will require Aranesp. She has tolerated this therapy well and the plan is to continue with the monthly monitoring and she will receive Aranesp to boost her hemoglobin above 10.   Her hemoglobin is 9.4 today and she will receive Aranesp at this time.  2. 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 Her iron studies in April 04, 2017 were within normal range.  3. Rheumatoid arthritis: Likely contributing to chronic anemia related to chronic inflammatory process.  She is currently on Orencia which have helped her symptoms.   4. Follow-up: Will be in 5 months to monitor her clinical status.  Zola Button, MD 12/7/201810:28 AM

## 2017-05-16 NOTE — Telephone Encounter (Signed)
Scheduled appt per 12/7 los - Gave patient AVS and calender per los.  

## 2017-05-20 ENCOUNTER — Inpatient Hospital Stay (HOSPITAL_COMMUNITY): Admission: RE | Admit: 2017-05-20 | Payer: Medicare Other | Source: Ambulatory Visit

## 2017-05-27 DIAGNOSIS — L709 Acne, unspecified: Secondary | ICD-10-CM | POA: Diagnosis not present

## 2017-05-27 DIAGNOSIS — Z79899 Other long term (current) drug therapy: Secondary | ICD-10-CM | POA: Diagnosis not present

## 2017-05-27 DIAGNOSIS — R208 Other disturbances of skin sensation: Secondary | ICD-10-CM | POA: Diagnosis not present

## 2017-05-27 DIAGNOSIS — Z5181 Encounter for therapeutic drug level monitoring: Secondary | ICD-10-CM | POA: Diagnosis not present

## 2017-05-27 DIAGNOSIS — L810 Postinflammatory hyperpigmentation: Secondary | ICD-10-CM | POA: Diagnosis not present

## 2017-05-28 ENCOUNTER — Other Ambulatory Visit (HOSPITAL_COMMUNITY): Payer: Self-pay | Admitting: *Deleted

## 2017-05-29 ENCOUNTER — Ambulatory Visit (HOSPITAL_COMMUNITY)
Admission: RE | Admit: 2017-05-29 | Discharge: 2017-05-29 | Disposition: A | Discharge status: Home / Self Care | Payer: Medicare Other | Source: Ambulatory Visit | Attending: Rheumatology | Admitting: Rheumatology

## 2017-05-29 DIAGNOSIS — M069 Rheumatoid arthritis, unspecified: Secondary | ICD-10-CM | POA: Diagnosis not present

## 2017-05-29 MED ORDER — SODIUM CHLORIDE 0.9 % IV SOLN
1000.0000 mg | INTRAVENOUS | Status: DC
  Administered 2017-05-29: 10:00:00 1000 mg via INTRAVENOUS
  Filled 2017-05-29: qty 40

## 2017-05-29 MED ORDER — SODIUM CHLORIDE 0.9 % IV SOLN
INTRAVENOUS | Status: DC
  Administered 2017-05-29: 10:00:00 via INTRAVENOUS

## 2017-06-05 ENCOUNTER — Other Ambulatory Visit: Payer: Self-pay | Admitting: *Deleted

## 2017-06-05 NOTE — Patient Outreach (Signed)
[facility]) Care Management  06/05/2017   Jessica Howard [birth year] [id number]  Subjective: RN Health Coach telephone call to patient.  Hipaa compliance verified. Per patient she is doing pretty good. Patient is on vacation. Patient fasting blood sugar is 118. Per patient she is trying to eat healthy. Patient had for breakfast a cheese omelet, 1 piece of toast and a piece of [place] sausage. Patient stated she is doing portion control. Patient stated she is doing some chair exercises. She is working on getting a routine. Patient is planning to go to [employer] recreation center in February once she turns 57 to be able to get into the senior program.  Patient has agreed to follow up outreach calls.    Current Medications:  Current Outpatient Medications  Medication Sig Dispense Refill  . abatacept (ORENCIA) 250 MG injection Inject 1,000 mg into the vein every 28 (twenty-eight) days. Monthly Infusion    . albuterol (PROVENTIL HFA;VENTOLIN HFA) 108 (90 Base) MCG/ACT inhaler Inhale 2 puffs into the lungs every 6 (six) hours as needed for wheezing or shortness of breath.    Marland Kitchen amitriptyline (ELAVIL) 25 MG tablet Take 25 mg by mouth at bedtime.     . ARTIFICIAL TEAR OP Place 1 drop into both eyes as needed (for dry eyes).    . BYSTOLIC 10 MG tablet Take 20 mg by mouth daily.     . chlorthalidone (HYGROTON) 25 MG tablet Take 25 mg by mouth daily.    . CRESTOR 20 MG tablet Take 20 mg by mouth daily.     . cyclobenzaprine (FLEXERIL) 10 MG tablet Take 10 mg by mouth 3 (three) times daily as needed.     . diltiazem (CARDIZEM CD) 240 MG 24 hr capsule Take 240 mg by mouth daily.     . fluticasone (FLONASE) 50 MCG/ACT nasal spray Place 1 spray into both nostrils daily as needed for allergies or rhinitis.    Marland Kitchen gabapentin (NEURONTIN) 300 MG capsule Take 600 mg by mouth 3 (three) times daily.     Marland Kitchen HYDROcodone-acetaminophen (NORCO/VICODIN) 5-325 MG tablet Take 1 tablet by mouth every  6 (six) hours as needed for moderate pain.    Marland Kitchen insulin aspart (NOVOLOG) 100 UNIT/ML injection Inject 55-60 Units into the skin 3 (three) times daily before meals. 55 base Plus sliding scale    . insulin NPH (HUMULIN N,NOVOLIN N) 100 UNIT/ML injection Inject 75 Units into the skin 3 (three) times daily with meals. 75 units with breakfast; lunch 75 units; 75 units at dinner    . irbesartan (AVAPRO) 300 MG tablet Take 300 mg by mouth daily.     . ISOtretinoin (ACCUTANE) 40 MG capsule Take 40 mg by mouth daily.    Marland Kitchen leflunomide (ARAVA) 20 MG tablet Take 20 mg by mouth daily.    . magnesium oxide (MAG-OX) 400 MG tablet Take 400 mg by mouth daily.    . meclizine (ANTIVERT) 25 MG tablet Take 25 mg by mouth 3 (three) times daily as needed for dizziness.    . metFORMIN (GLUCOPHAGE-XR) 500 MG 24 hr tablet Take 500 mg by mouth every evening.    . pantoprazole (PROTONIX) 40 MG tablet Take 40 mg by mouth daily.    Marland Kitchen tiZANidine (ZANAFLEX) 4 MG tablet Take 4 mg by mouth 2 (two) times daily as needed (migraines). Limit 2 days per week    . topiramate (TOPAMAX) 200 MG tablet Take 200 mg by mouth daily.    Marland Kitchen  TRULICITY 3.50 KX/3.8HW SOPN Inject 0.75 mg into the skin once a week. Patient takes on Wednesday.    . Vitamin D, Ergocalciferol, (DRISDOL) 50000 units CAPS capsule Take 50,000 Units by mouth every 7 (seven) days.    . VOLTAREN 1 % GEL Apply 1 application topically 3 (three) times daily as needed (for pain).      No current facility-administered medications for this visit.     Functional Status:  In your present state of health, do you have any difficulty performing the following activities: 06/05/2017 05/07/2017  Hearing? Y Y  Comment - -  Vision? N N  Comment - -  Difficulty concentrating or making decisions? N N  Walking or climbing stairs? N N  Dressing or bathing? N N  Doing errands, shopping? Tempie Donning  Preparing Food and eating ? N N  Using the Toilet? N N  In the past six months, have you  accidently leaked urine? N N  Do you have problems with loss of bowel control? N N  Managing your Medications? N N  Managing your Finances? N N  Housekeeping or managing your Housekeeping? Y Y  Some recent data might be hidden    Fall/Depression Screening: Fall Risk  06/05/2017 05/07/2017 03/14/2017  Falls in the past year? No No No  Number falls in past yr: - - -  Risk for fall due to : Impaired balance/gait;Impaired mobility Impaired balance/gait;Impaired mobility -   PHQ 2/9 Scores 06/05/2017 05/07/2017 03/13/2017 03/07/2017 01/30/2017 12/09/2016 12/06/2016  PHQ - 2 Score 0 0 0 0 0 0 0    Assessment: Fasting blood sugar 118 Patient is making  healthier food choices Patient is starting to do chair exercises Patient will continue to benefit from [employer] telephonic outreach for education and support for diabetes self management.      Plan:  RN sent educational material on serving sizes Rn discussed portion control RN discussed healthy eating RN discussed routine exercises RN will follow within the month of January Patient has follow up podiatrist appointment [phone] Patient has follow up PCP appointment in January Patient has mammogram set for [id number]  [place] [facility] [phone]

## 2017-06-09 ENCOUNTER — Ambulatory Visit: Payer: Medicare Other

## 2017-06-18 DIAGNOSIS — M545 Low back pain: Secondary | ICD-10-CM | POA: Diagnosis not present

## 2017-06-18 DIAGNOSIS — R21 Rash and other nonspecific skin eruption: Secondary | ICD-10-CM | POA: Diagnosis not present

## 2017-06-18 DIAGNOSIS — M069 Rheumatoid arthritis, unspecified: Secondary | ICD-10-CM | POA: Diagnosis not present

## 2017-06-18 DIAGNOSIS — Z79899 Other long term (current) drug therapy: Secondary | ICD-10-CM | POA: Diagnosis not present

## 2017-06-18 DIAGNOSIS — M25561 Pain in right knee: Secondary | ICD-10-CM | POA: Diagnosis not present

## 2017-06-18 DIAGNOSIS — E669 Obesity, unspecified: Secondary | ICD-10-CM | POA: Diagnosis not present

## 2017-06-18 DIAGNOSIS — M150 Primary generalized (osteo)arthritis: Secondary | ICD-10-CM | POA: Diagnosis not present

## 2017-06-18 DIAGNOSIS — D649 Anemia, unspecified: Secondary | ICD-10-CM | POA: Diagnosis not present

## 2017-06-19 ENCOUNTER — Inpatient Hospital Stay: Payer: Medicare Other | Attending: Oncology

## 2017-06-19 ENCOUNTER — Inpatient Hospital Stay: Payer: Medicare Other

## 2017-06-19 VITALS — BP 143/97 | HR 74 | Temp 98.2°F | Resp 17

## 2017-06-19 DIAGNOSIS — N189 Chronic kidney disease, unspecified: Secondary | ICD-10-CM | POA: Insufficient documentation

## 2017-06-19 DIAGNOSIS — N289 Disorder of kidney and ureter, unspecified: Secondary | ICD-10-CM

## 2017-06-19 DIAGNOSIS — D631 Anemia in chronic kidney disease: Secondary | ICD-10-CM | POA: Diagnosis not present

## 2017-06-19 DIAGNOSIS — D509 Iron deficiency anemia, unspecified: Secondary | ICD-10-CM

## 2017-06-19 LAB — CBC WITH DIFFERENTIAL/PLATELET
Basophils Absolute: 0.1 10*3/uL (ref 0.0–0.1)
Basophils Relative: 1 %
EOS ABS: 0.3 10*3/uL (ref 0.0–0.5)
EOS PCT: 3 %
HCT: 31.8 % — ABNORMAL LOW (ref 34.8–46.6)
Hemoglobin: 9.7 g/dL — ABNORMAL LOW (ref 11.6–15.9)
LYMPHS ABS: 3.3 10*3/uL (ref 0.9–3.3)
Lymphocytes Relative: 36 %
MCH: 20.5 pg — AB (ref 25.1–34.0)
MCHC: 30.5 g/dL — AB (ref 31.5–36.0)
MCV: 67.2 fL — ABNORMAL LOW (ref 79.5–101.0)
MONOS PCT: 12 %
Monocytes Absolute: 1.1 10*3/uL — ABNORMAL HIGH (ref 0.1–0.9)
Neutro Abs: 4.3 10*3/uL (ref 1.5–6.5)
Neutrophils Relative %: 48 %
PLATELETS: 313 10*3/uL (ref 145–400)
RBC: 4.73 MIL/uL (ref 3.70–5.45)
RDW: 18.7 % — AB (ref 11.2–16.1)
WBC: 8.9 10*3/uL (ref 3.9–10.3)

## 2017-06-19 MED ORDER — DARBEPOETIN ALFA 300 MCG/0.6ML IJ SOSY
300.0000 ug | PREFILLED_SYRINGE | Freq: Once | INTRAMUSCULAR | Status: AC
  Administered 2017-06-19: 300 ug via SUBCUTANEOUS

## 2017-06-19 NOTE — Patient Instructions (Signed)
Darbepoetin Alfa injection What is this medicine? DARBEPOETIN ALFA (dar be POE e tin AL fa) helps your body make more red blood cells. It is used to treat anemia caused by chronic kidney failure and chemotherapy. This medicine may be used for other purposes; ask your health care provider or pharmacist if you have questions. COMMON BRAND NAME(S): Aranesp What should I tell my health care provider before I take this medicine? They need to know if you have any of these conditions: -blood clotting disorders or history of blood clots -cancer patient not on chemotherapy -cystic fibrosis -heart disease, such as angina, heart failure, or a history of a heart attack -hemoglobin level of 12 g/dL or greater -high blood pressure -low levels of folate, iron, or vitamin B12 -seizures -an unusual or allergic reaction to darbepoetin, erythropoietin, albumin, hamster proteins, latex, other medicines, foods, dyes, or preservatives -pregnant or trying to get pregnant -breast-feeding How should I use this medicine? This medicine is for injection into a vein or under the skin. It is usually given by a health care professional in a hospital or clinic setting. If you get this medicine at home, you will be taught how to prepare and give this medicine. Do not shake the solution before you withdraw a dose. Use exactly as directed. Take your medicine at regular intervals. Do not take your medicine more often than directed. It is important that you put your used needles and syringes in a special sharps container. Do not put them in a trash can. If you do not have a sharps container, call your pharmacist or healthcare provider to get one. Talk to your pediatrician regarding the use of this medicine in children. While this medicine may be used in children as young as 1 year for selected conditions, precautions do apply. Overdosage: If you think you have taken too much of this medicine contact a poison control center or  emergency room at once. NOTE: This medicine is only for you. Do not share this medicine with others. What if I miss a dose? If you miss a dose, take it as soon as you can. If it is almost time for your next dose, take only that dose. Do not take double or extra doses. What may interact with this medicine? Do not take this medicine with any of the following medications: -epoetin alfa This list may not describe all possible interactions. Give your health care provider a list of all the medicines, herbs, non-prescription drugs, or dietary supplements you use. Also tell them if you smoke, drink alcohol, or use illegal drugs. Some items may interact with your medicine. What should I watch for while using this medicine? Visit your prescriber or health care professional for regular checks on your progress and for the needed blood tests and blood pressure measurements. It is especially important for the doctor to make sure your hemoglobin level is in the desired range, to limit the risk of potential side effects and to give you the best benefit. Keep all appointments for any recommended tests. Check your blood pressure as directed. Ask your doctor what your blood pressure should be and when you should contact him or her. As your body makes more red blood cells, you may need to take iron, folic acid, or vitamin B supplements. Ask your doctor or health care provider which products are right for you. If you have kidney disease continue dietary restrictions, even though this medication can make you feel better. Talk with your doctor or health   care professional about the foods you eat and the vitamins that you take. What side effects may I notice from receiving this medicine? Side effects that you should report to your doctor or health care professional as soon as possible: -allergic reactions like skin rash, itching or hives, swelling of the face, lips, or tongue -breathing problems -changes in vision -chest  pain -confusion, trouble speaking or understanding -feeling faint or lightheaded, falls -high blood pressure -muscle aches or pains -pain, swelling, warmth in the leg -rapid weight gain -severe headaches -sudden numbness or weakness of the face, arm or leg -trouble walking, dizziness, loss of balance or coordination -seizures (convulsions) -swelling of the ankles, feet, hands -unusually weak or tired Side effects that usually do not require medical attention (report to your doctor or health care professional if they continue or are bothersome): -diarrhea -fever, chills (flu-like symptoms) -headaches -nausea, vomiting -redness, stinging, or swelling at site where injected This list may not describe all possible side effects. Call your doctor for medical advice about side effects. You may report side effects to FDA at 1-800-FDA-1088. Where should I keep my medicine? Keep out of the reach of children. Store in a refrigerator between 2 and 8 degrees C (36 and 46 degrees F). Do not freeze. Do not shake. Throw away any unused portion if using a single-dose vial. Throw away any unused medicine after the expiration date. NOTE: This sheet is a summary. It may not cover all possible information. If you have questions about this medicine, talk to your doctor, pharmacist, or health care provider.  2015, [employer]. (2008-05-10 10:23:57)  

## 2017-06-26 ENCOUNTER — Ambulatory Visit (HOSPITAL_COMMUNITY)
Admission: RE | Admit: 2017-06-26 | Discharge: 2017-06-26 | Disposition: A | Discharge status: Home / Self Care | Payer: Medicare Other | Source: Ambulatory Visit | Attending: Rheumatology | Admitting: Rheumatology

## 2017-06-26 DIAGNOSIS — M069 Rheumatoid arthritis, unspecified: Secondary | ICD-10-CM | POA: Diagnosis not present

## 2017-06-26 MED ORDER — SODIUM CHLORIDE 0.9 % IV SOLN
INTRAVENOUS | Status: DC
  Administered 2017-06-26: 11:00:00 via INTRAVENOUS

## 2017-06-26 MED ORDER — SODIUM CHLORIDE 0.9 % IV SOLN
1000.0000 mg | INTRAVENOUS | Status: DC
  Administered 2017-06-26: 1000 mg via INTRAVENOUS
  Filled 2017-06-26: qty 40

## 2017-06-27 ENCOUNTER — Ambulatory Visit: Payer: Medicare Other | Admitting: Podiatry

## 2017-07-01 ENCOUNTER — Ambulatory Visit
Admission: RE | Admit: 2017-07-01 | Discharge: 2017-07-01 | Disposition: A | Discharge status: Home / Self Care | Payer: Medicare Other | Source: Ambulatory Visit | Attending: Internal Medicine | Admitting: Internal Medicine

## 2017-07-01 DIAGNOSIS — Z139 Encounter for screening, unspecified: Secondary | ICD-10-CM

## 2017-07-01 DIAGNOSIS — Z1231 Encounter for screening mammogram for malignant neoplasm of breast: Secondary | ICD-10-CM | POA: Diagnosis not present

## 2017-07-02 ENCOUNTER — Ambulatory Visit: Payer: Medicare Other | Admitting: Podiatry

## 2017-07-07 ENCOUNTER — Ambulatory Visit: Payer: Self-pay | Admitting: *Deleted

## 2017-07-07 DIAGNOSIS — Z5181 Encounter for therapeutic drug level monitoring: Secondary | ICD-10-CM | POA: Diagnosis not present

## 2017-07-07 DIAGNOSIS — Z79899 Other long term (current) drug therapy: Secondary | ICD-10-CM | POA: Diagnosis not present

## 2017-07-07 DIAGNOSIS — L700 Acne vulgaris: Secondary | ICD-10-CM | POA: Diagnosis not present

## 2017-07-22 ENCOUNTER — Inpatient Hospital Stay: Payer: Medicare Other | Attending: Oncology

## 2017-07-22 ENCOUNTER — Inpatient Hospital Stay: Payer: Medicare Other

## 2017-07-22 DIAGNOSIS — N189 Chronic kidney disease, unspecified: Secondary | ICD-10-CM | POA: Diagnosis not present

## 2017-07-22 DIAGNOSIS — D631 Anemia in chronic kidney disease: Secondary | ICD-10-CM | POA: Insufficient documentation

## 2017-07-22 DIAGNOSIS — D509 Iron deficiency anemia, unspecified: Secondary | ICD-10-CM

## 2017-07-22 LAB — CBC WITH DIFFERENTIAL/PLATELET
BASOS ABS: 0 10*3/uL (ref 0.0–0.1)
BASOS PCT: 1 %
Eosinophils Absolute: 0.2 10*3/uL (ref 0.0–0.5)
Eosinophils Relative: 3 %
HEMATOCRIT: 33.5 % — AB (ref 34.8–46.6)
HEMOGLOBIN: 10.3 g/dL — AB (ref 11.6–15.9)
LYMPHS PCT: 43 %
Lymphs Abs: 2.8 10*3/uL (ref 0.9–3.3)
MCH: 20.5 pg — ABNORMAL LOW (ref 25.1–34.0)
MCHC: 30.7 g/dL — ABNORMAL LOW (ref 31.5–36.0)
MCV: 66.6 fL — ABNORMAL LOW (ref 79.5–101.0)
Monocytes Absolute: 0.8 10*3/uL (ref 0.1–0.9)
Monocytes Relative: 13 %
NEUTROS ABS: 2.6 10*3/uL (ref 1.5–6.5)
NEUTROS PCT: 40 %
NRBC: 0 /100{WBCs}
Platelets: 300 10*3/uL (ref 145–400)
RBC: 5.03 MIL/uL (ref 3.70–5.45)
RDW: 18.1 % — ABNORMAL HIGH (ref 11.2–14.5)
WBC: 6.5 10*3/uL (ref 3.9–10.3)

## 2017-07-22 NOTE — Progress Notes (Signed)
Hemoglobin noted  At 10.3 today. No injection needed at this time per order. Pt given current copy of labs and schedule. Instructed to follow schedule and call our office should issues occur.

## 2017-07-24 ENCOUNTER — Other Ambulatory Visit: Payer: Self-pay | Admitting: *Deleted

## 2017-07-24 NOTE — Patient Outreach (Signed)
[facility]) Care Management  07/24/2017   Jessica Howard 05/21/1961 [id number]  RN Health Coach telephone call to patient.  Hipaa compliance verified Per patient her fasting blood is 118. Patient has not had recent falls. Per patient her weight is 268 pounds today. This has increased from 263 in November.  Per patient her birthdays is on 07/25/2015 and she would be able to go to facilities at [facility] for senior citizens. Per patient she is having pain and swelling in her hands and knee. Patient wears compession gloves on hand. Patient  agreed to further  follow up outreach calls.  Current Medications:  Current Outpatient Medications  Medication Sig Dispense Refill  . abatacept (ORENCIA) 250 MG injection Inject 1,000 mg into the vein every 28 (twenty-eight) days. Monthly Infusion    . albuterol (PROVENTIL HFA;VENTOLIN HFA) 108 (90 Base) MCG/ACT inhaler Inhale 2 puffs into the lungs every 6 (six) hours as needed for wheezing or shortness of breath.    Marland Kitchen amitriptyline (ELAVIL) 25 MG tablet Take 25 mg by mouth at bedtime.     . ARTIFICIAL TEAR OP Place 1 drop into both eyes as needed (for dry eyes).    . BYSTOLIC 10 MG tablet Take 20 mg by mouth daily.     . chlorthalidone (HYGROTON) 25 MG tablet Take 25 mg by mouth daily.    . CRESTOR 20 MG tablet Take 20 mg by mouth daily.     . cyclobenzaprine (FLEXERIL) 10 MG tablet Take 10 mg by mouth 3 (three) times daily as needed.     . diltiazem (CARDIZEM CD) 240 MG 24 hr capsule Take 240 mg by mouth daily.     . fluticasone (FLONASE) 50 MCG/ACT nasal spray Place 1 spray into both nostrils daily as needed for allergies or rhinitis.    Marland Kitchen gabapentin (NEURONTIN) 300 MG capsule Take 600 mg by mouth 3 (three) times daily.     Marland Kitchen HYDROcodone-acetaminophen (NORCO/VICODIN) 5-325 MG tablet Take 1 tablet by mouth every 6 (six) hours as needed for moderate pain.    Marland Kitchen insulin aspart (NOVOLOG) 100 UNIT/ML injection Inject 55-60 Units into  the skin 3 (three) times daily before meals. 55 base Plus sliding scale    . insulin NPH (HUMULIN N,NOVOLIN N) 100 UNIT/ML injection Inject 75 Units into the skin 3 (three) times daily with meals. 75 units with breakfast; lunch 75 units; 75 units at dinner    . irbesartan (AVAPRO) 300 MG tablet Take 300 mg by mouth daily.     . ISOtretinoin (ACCUTANE) 40 MG capsule Take 40 mg by mouth daily.    Marland Kitchen leflunomide (ARAVA) 20 MG tablet Take 20 mg by mouth daily.    . magnesium oxide (MAG-OX) 400 MG tablet Take 400 mg by mouth daily.    . meclizine (ANTIVERT) 25 MG tablet Take 25 mg by mouth 3 (three) times daily as needed for dizziness.    . metFORMIN (GLUCOPHAGE-XR) 500 MG 24 hr tablet Take 500 mg by mouth every evening.    . pantoprazole (PROTONIX) 40 MG tablet Take 40 mg by mouth daily.    Marland Kitchen tiZANidine (ZANAFLEX) 4 MG tablet Take 4 mg by mouth 2 (two) times daily as needed (migraines). Limit 2 days per week    . topiramate (TOPAMAX) 200 MG tablet Take 200 mg by mouth daily.    . TRULICITY 6.65 LD/3.5TS SOPN Inject 0.75 mg into the skin once a week. Patient takes on Wednesday.    Marland Kitchen  Vitamin D, Ergocalciferol, (DRISDOL) 50000 units CAPS capsule Take 50,000 Units by mouth every 7 (seven) days.    . VOLTAREN 1 % GEL Apply 1 application topically 3 (three) times daily as needed (for pain).      No current facility-administered medications for this visit.     Functional Status:  In your present state of health, do you have any difficulty performing the following activities: 07/24/2017 06/05/2017  Hearing? Y Y  Comment - -  Vision? N N  Comment - -  Difficulty concentrating or making decisions? N N  Walking or climbing stairs? N N  Dressing or bathing? - N  Doing errands, shopping? Tempie Donning  Preparing Food and eating ? N N  Using the Toilet? N N  In the past six months, have you accidently leaked urine? N N  Do you have problems with loss of bowel control? N N  Managing your Medications? N N  Managing  your Finances? N N  Housekeeping or managing your Housekeeping? Y Y  Some recent data might be hidden    Fall/Depression Screening: Fall Risk  07/24/2017 06/05/2017 05/07/2017  Falls in the past year? No No No  Number falls in past yr: - - -  Risk for fall due to : Impaired balance/gait;Impaired mobility Impaired balance/gait;Impaired mobility Impaired balance/gait;Impaired mobility   PHQ 2/9 Scores 07/24/2017 06/05/2017 05/07/2017 03/13/2017 03/07/2017 01/30/2017 12/09/2016  PHQ - 2 Score 0 0 0 0 0 0 0   THN CM Care Plan Problem One     Most Recent Value  Care Plan Problem One  Knowledge Deficit in self management of diabetes  Role Documenting the Problem One  [place] for Problem One  Active  THN Long Term Goal   Patient will report a decrease in weight  from 268 pounds within the next 90 days  THN Long Term Goal Start Date  07/24/17  Interventions for Problem One Long Term Goal  RN discussed with patient about portion contol, and exercises. RN reiterated eating healthy and made patient aware that her weight has increased since last outreach. RN will encourage and support patient in weight loss process. RN will follow up outreach with further discussion and teach back  THN CM Short Term Goal #1   Patient will begin a routine exercise program and adhere within the next 30 days  Interventions for Short Term Goal #1  RN reiterated the importance of weight loss and exercise.  Patient  will call  [employer] center and [employer] . Patient goal is to start classes by next outreach call. RN will follow up for compliance  THN CM Short Term Goal #2   Patient will report that she has developed a routine chair exercise program with 30 days  THN CM Short Term Goal #4  Patient will be able to describe  healthy snack within the next 30 days  THN CM Short Term Goal #4 Met Date  07/24/17  Interventions for Short Term Goal #4  Patient received a list of healthy snacks and  diabetic foods from  fast food chain. RN reiterates maintaining her diet. RN discussed portion control. RN follows up with support and discussion .  THN CM Short Term Goal #5   Patient will be able to describe foods low in carbohydrates and fats within the next 30 days  Interventions for Short Term Goal #5  Patient received the educational material Diabetes food, counting carbohydrates and portion control. RN discussed adhering  to her diet and adding exercise to help increase the metabolism. RN discussed ways to decrease fats by baking, decreasing butter and reading labels. Patient received educational material on triglycerides and about eating food lower in triglycerides. RN will follow up with discussion and teach back.      Assessment: Patient weight increased from 263 to 268 pounds Patient is not exercising Fasting blood sugar is 118 No recent falls A1C 7.3 next follow up is next month Patient will  Continue to benefit from [place] telephonic outreach for education and support for diabetes self management.    Plan:  Patient rescheduled Podiatrist on  08/01/2017 Patient will call [facility] senior center and [facility] an start a routine exercise program before next out reach Patient will monitor weight Patient will order elastic compression hose to wear during exercising Patient will monitor her food portions for portion control RN will follow up outreach within them the of March  [facility] [phone]

## 2017-07-29 DIAGNOSIS — L700 Acne vulgaris: Secondary | ICD-10-CM | POA: Diagnosis not present

## 2017-07-29 DIAGNOSIS — I10 Essential (primary) hypertension: Secondary | ICD-10-CM | POA: Diagnosis not present

## 2017-07-29 DIAGNOSIS — L709 Acne, unspecified: Secondary | ICD-10-CM | POA: Diagnosis not present

## 2017-07-29 DIAGNOSIS — E7800 Pure hypercholesterolemia, unspecified: Secondary | ICD-10-CM | POA: Diagnosis not present

## 2017-07-29 DIAGNOSIS — I5032 Chronic diastolic (congestive) heart failure: Secondary | ICD-10-CM | POA: Diagnosis not present

## 2017-07-29 DIAGNOSIS — E10319 Type 1 diabetes mellitus with unspecified diabetic retinopathy without macular edema: Secondary | ICD-10-CM | POA: Diagnosis not present

## 2017-07-30 DIAGNOSIS — L723 Sebaceous cyst: Secondary | ICD-10-CM | POA: Diagnosis not present

## 2017-07-30 DIAGNOSIS — L709 Acne, unspecified: Secondary | ICD-10-CM | POA: Diagnosis not present

## 2017-07-31 ENCOUNTER — Ambulatory Visit (HOSPITAL_COMMUNITY)
Admission: RE | Admit: 2017-07-31 | Discharge: 2017-07-31 | Disposition: A | Discharge status: Home / Self Care | Payer: Medicare Other | Source: Ambulatory Visit | Attending: Rheumatology | Admitting: Rheumatology

## 2017-07-31 DIAGNOSIS — M069 Rheumatoid arthritis, unspecified: Secondary | ICD-10-CM | POA: Diagnosis not present

## 2017-07-31 MED ORDER — SODIUM CHLORIDE 0.9 % IV SOLN
INTRAVENOUS | Status: DC
  Administered 2017-07-31: 10:00:00 via INTRAVENOUS

## 2017-07-31 MED ORDER — SODIUM CHLORIDE 0.9 % IV SOLN
1000.0000 mg | INTRAVENOUS | Status: DC
  Administered 2017-07-31: 1000 mg via INTRAVENOUS
  Filled 2017-07-31: qty 40

## 2017-08-01 ENCOUNTER — Ambulatory Visit (INDEPENDENT_AMBULATORY_CARE_PROVIDER_SITE_OTHER): Payer: Medicare Other | Admitting: Podiatry

## 2017-08-01 ENCOUNTER — Ambulatory Visit (INDEPENDENT_AMBULATORY_CARE_PROVIDER_SITE_OTHER): Payer: Medicare Other

## 2017-08-01 ENCOUNTER — Encounter: Payer: Self-pay | Admitting: Podiatry

## 2017-08-01 DIAGNOSIS — M21619 Bunion of unspecified foot: Secondary | ICD-10-CM | POA: Diagnosis not present

## 2017-08-01 DIAGNOSIS — B351 Tinea unguium: Secondary | ICD-10-CM | POA: Diagnosis not present

## 2017-08-01 DIAGNOSIS — M2141 Flat foot [pes planus] (acquired), right foot: Secondary | ICD-10-CM

## 2017-08-01 DIAGNOSIS — M79675 Pain in left toe(s): Secondary | ICD-10-CM | POA: Diagnosis not present

## 2017-08-01 DIAGNOSIS — M2142 Flat foot [pes planus] (acquired), left foot: Secondary | ICD-10-CM

## 2017-08-01 DIAGNOSIS — M79674 Pain in right toe(s): Secondary | ICD-10-CM

## 2017-08-01 MED ORDER — MELOXICAM 7.5 MG PO TABS: 7.5000 mg | ORAL_TABLET | Freq: Every day | ORAL | 0 refills | Status: DC

## 2017-08-01 NOTE — Progress Notes (Signed)
This patient presents to my office for continued preventative foot care services.  She says the nails are thick and long and painful and she walks.  . She also discusses having them surgically removed permanently.  This patient is diabetic with neuropathy.  She also is concerned about having burning-like pain noted in the left rear foot.  She says she has pain after she walks and severe swelling at the end of the day.  She points to an area on the inside of her rear foot as the site of pain.  She also has swelling noted at this area on the left not the right foot.  She has provided no self treatment nor sought any professional help for this condition.  She presents the office today for an evaluation and treatment of her nails and her burning left heel.   General Appearance  Alert, conversant and in no acute stress.  Vascular  Dorsalis pedis  pulses are palpable  bilaterally.  Posterior tibial pulses are non palpable due to swelling. Capillary return is within normal limits  bilaterally. Temperature is within normal limits  Bilaterally.  Neurologic  Senn-Weinstein monofilament wire test within normal limits  bilaterally. Muscle power within normal limits bilaterally.  Nails Thick disfigured discolored nails with subungual debris bilaterally from second  to fifth toes bilaterally. No evidence of bacterial infection or drainage bilaterally.  Orthopedic  No limitations of motion of motion feet bilaterally.  No crepitus or effusions noted.  Severe pes planus foot type  B/L.  Pain and swelling at the insertion PTT left foot. Palpable pain noted in the left heel upon palpation of the medial tuberosity.  Skin  normotropic skin with no porokeratosis noted bilaterally.  No signs of infections or ulcers noted.  Pinch callus hallux left foot.   Onychomycosis  B/L  PTTD left greater than right.  Plantar fascitis left foot.    Pes planus.   ROV.  X-rays were taken revealing a cystic lesions noted at the  navicular and the head of the first metatarsal left foot.  Elongated first metatarsal noted with elevation of the metatarsal head.   In an attempt to control her burning symptoms. She was dispensed a compression sock as well as a plantar fascial brace  on her left foot.   Patient was also prescribed Mobic 7.5 with instructions to take 1 daily.  RTC 3-4 weeks.   Gardiner Barefoot DPM

## 2017-08-05 DIAGNOSIS — I5032 Chronic diastolic (congestive) heart failure: Secondary | ICD-10-CM | POA: Diagnosis not present

## 2017-08-05 DIAGNOSIS — R270 Ataxia, unspecified: Secondary | ICD-10-CM | POA: Diagnosis not present

## 2017-08-05 DIAGNOSIS — E7800 Pure hypercholesterolemia, unspecified: Secondary | ICD-10-CM | POA: Diagnosis not present

## 2017-08-05 DIAGNOSIS — R42 Dizziness and giddiness: Secondary | ICD-10-CM | POA: Diagnosis not present

## 2017-08-05 DIAGNOSIS — L709 Acne, unspecified: Secondary | ICD-10-CM | POA: Diagnosis not present

## 2017-08-05 DIAGNOSIS — R110 Nausea: Secondary | ICD-10-CM | POA: Diagnosis not present

## 2017-08-06 ENCOUNTER — Other Ambulatory Visit: Payer: Self-pay | Admitting: Internal Medicine

## 2017-08-08 ENCOUNTER — Other Ambulatory Visit: Payer: Self-pay | Admitting: Internal Medicine

## 2017-08-08 DIAGNOSIS — R110 Nausea: Secondary | ICD-10-CM

## 2017-08-08 DIAGNOSIS — R270 Ataxia, unspecified: Secondary | ICD-10-CM

## 2017-08-08 DIAGNOSIS — R42 Dizziness and giddiness: Secondary | ICD-10-CM

## 2017-08-08 DIAGNOSIS — I5032 Chronic diastolic (congestive) heart failure: Secondary | ICD-10-CM

## 2017-08-13 DIAGNOSIS — Z5181 Encounter for therapeutic drug level monitoring: Secondary | ICD-10-CM | POA: Diagnosis not present

## 2017-08-13 DIAGNOSIS — L709 Acne, unspecified: Secondary | ICD-10-CM | POA: Diagnosis not present

## 2017-08-16 IMAGING — MR MR LUMBAR SPINE W/O CM
4 of 5 series · 25 of 48 positions shown · non-contrast
Comparison: MRI lumbar spine 02/24/2009

CLINICAL DATA: Posterior low back pain extending the anterior left
lower extremity and hip for 1 year. Left lower extremity numbness
and weakness.

EXAM:
MRI LUMBAR SPINE WITHOUT CONTRAST
TECHNIQUE: Multiplanar, multisequence MR imaging of the lumbar spine was
performed. No intravenous contrast was administered.

[Series 3: T2 · sagittal · 4.0mm · 0.55mm/px · 7 of 14 slices shown (1 of 2)]
[im 1/14]
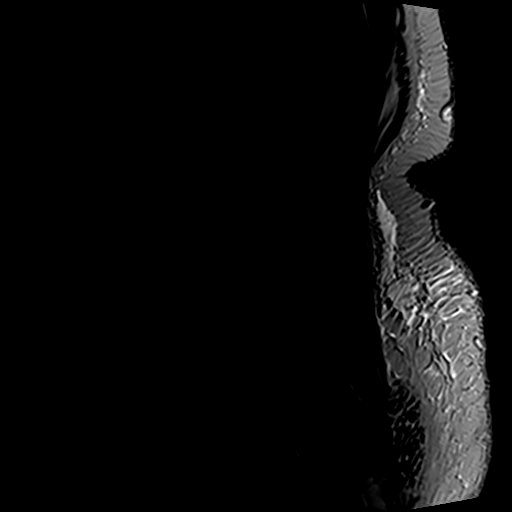
[im 3/14]
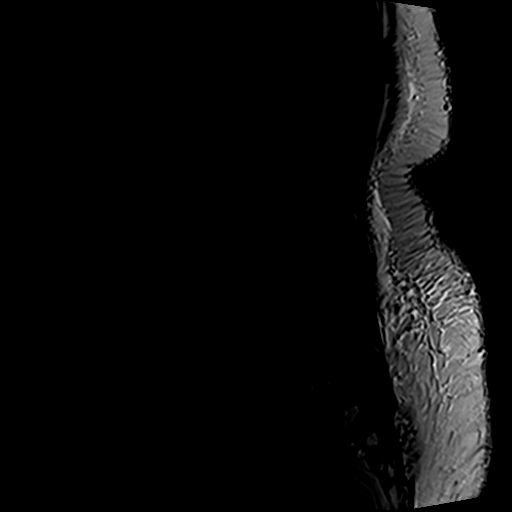
[im 5/14]
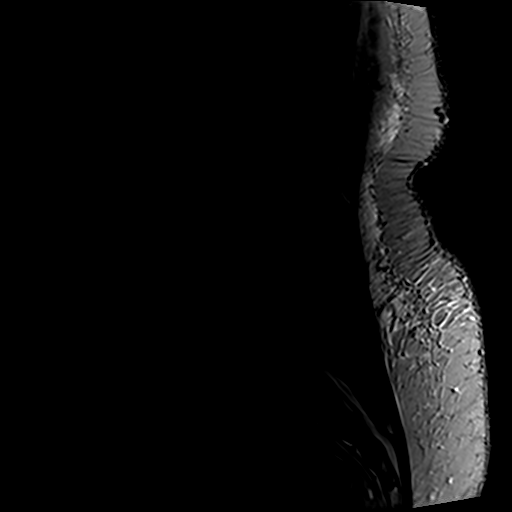
[im 7/14]
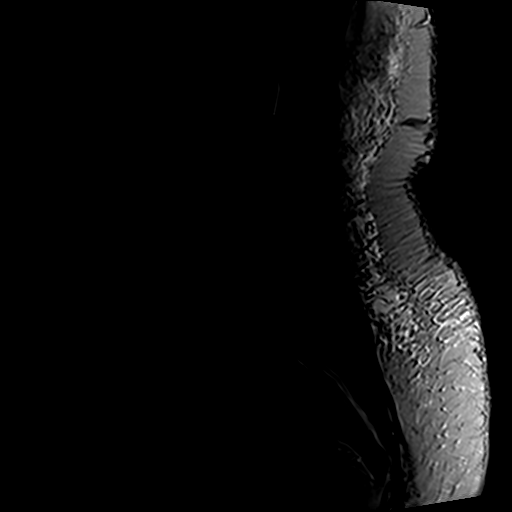
[im 9/14]
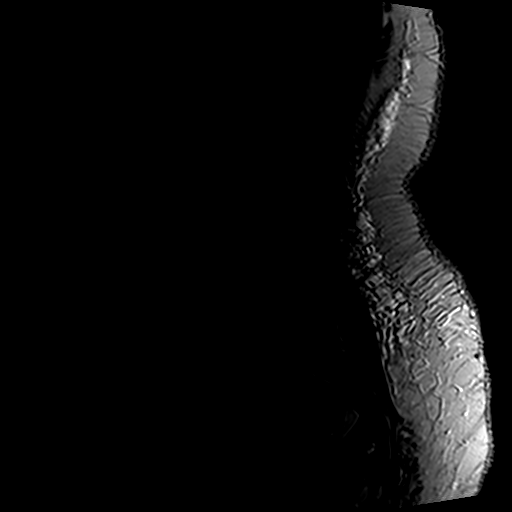
[im 11/14]
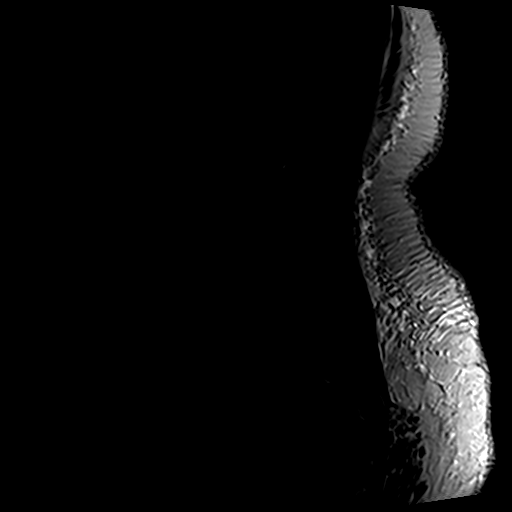
[im 14/14]
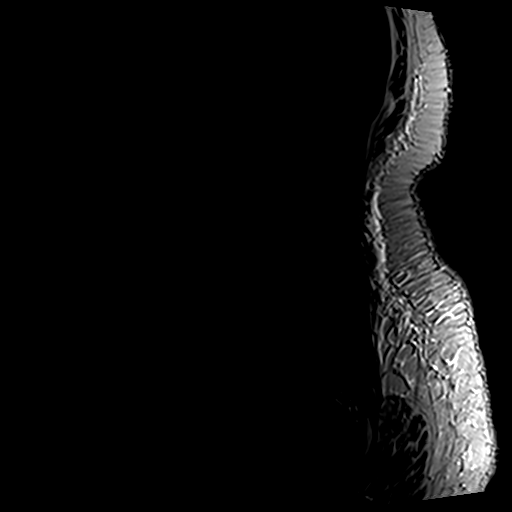

[Series 4: T1 · sagittal · 4.0mm · 0.55mm/px · 7 of 14 slices shown (1 of 2)]
[im 1/14]
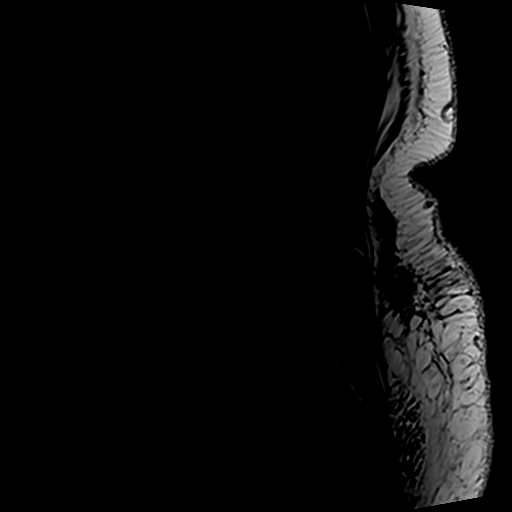
[im 3/14]
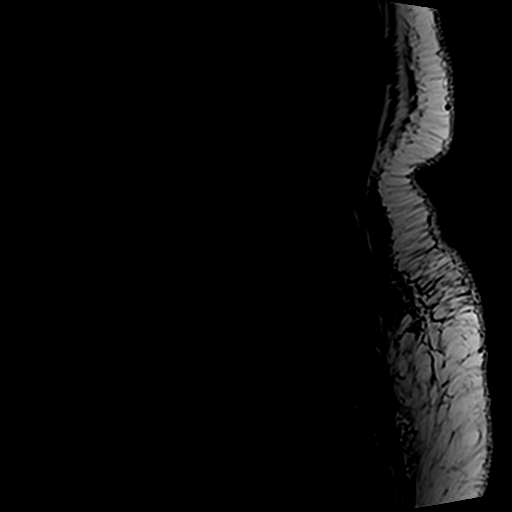
[im 5/14]
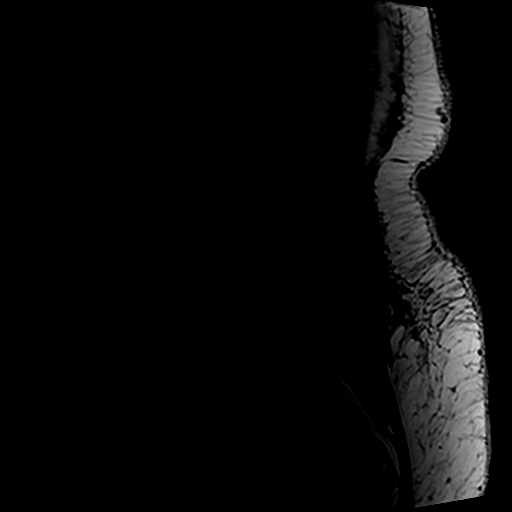
[im 7/14]
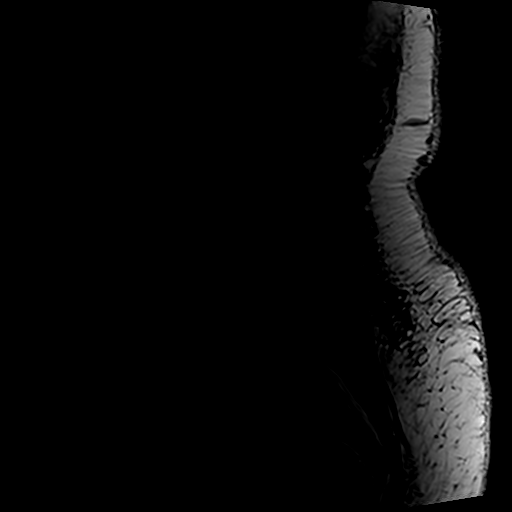
[im 9/14]
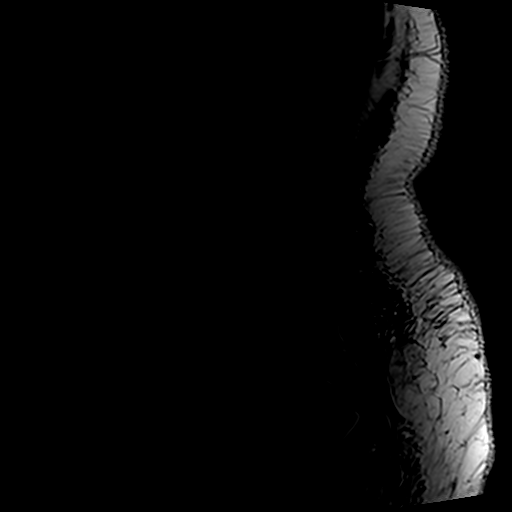
[im 11/14]
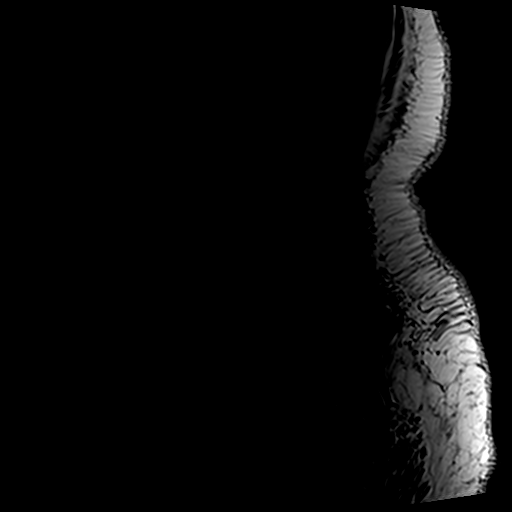
[im 14/14]
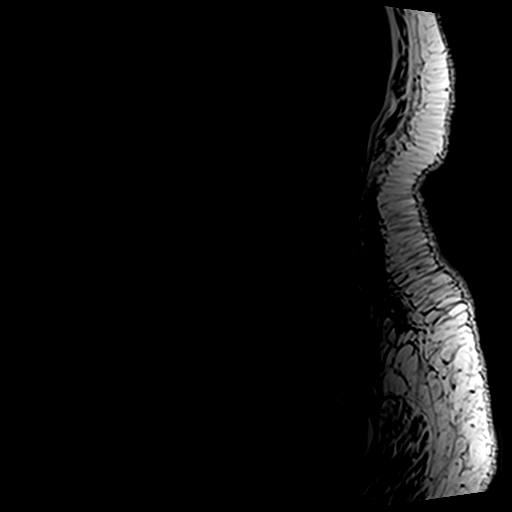

[Series 6: T2 · axial · 4.0mm · 0.70mm/px · z∈[-88,+85]mm · 8 of 31 slices shown (2 of 2)]
[im 1/31]
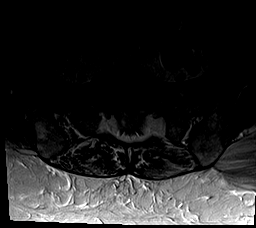
[im 5/31]
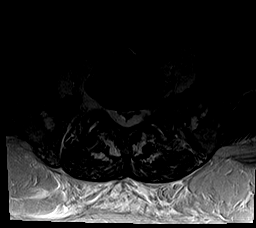
[im 10/31]
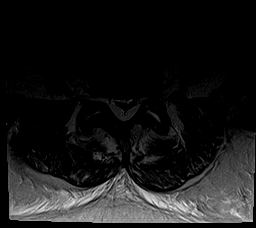
[im 14/31]
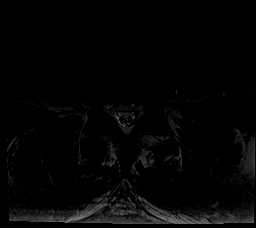
[im 17/31]
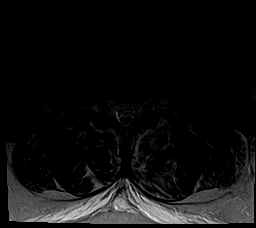
[im 21/31]
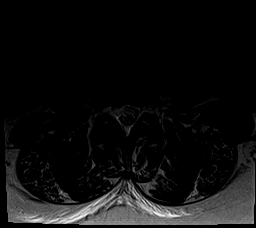
[im 26/31]
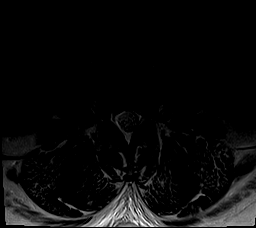
[im 31/31]
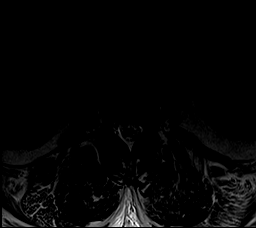

[Series 7: T1 · axial · 4.0mm · 0.35mm/px · z∈[-68,+60]mm · 3 of 31 slices shown (2 of 2)]
[im 5/31]
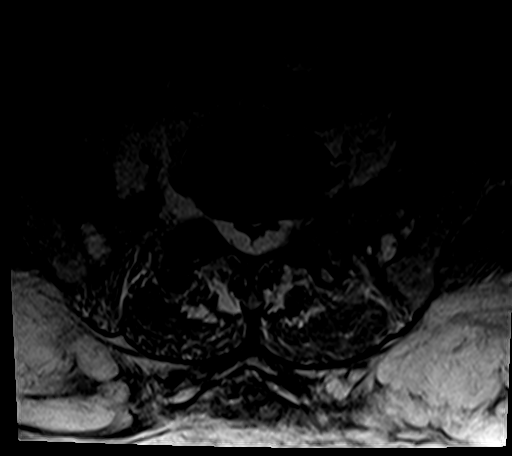
[im 17/31]
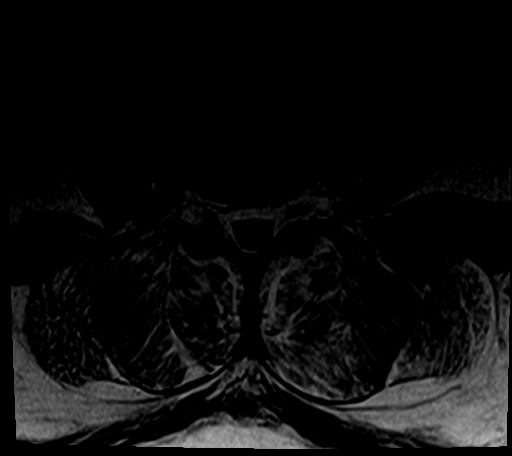
[im 26/31]
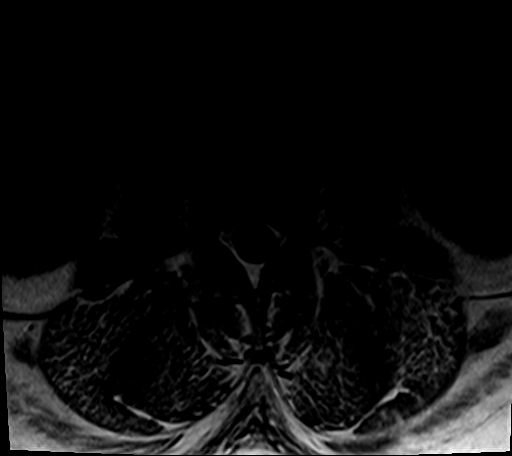

[25 of 48 positions shown; findings below may reference images not displayed]

FINDINGS: Normal signal is present a conus medullaris which terminates at
L1-2, within normal limits. Minimal endplate marrow changes
demonstrates slight progression at L5-S1. Marrow signal, vertebral
body heights, and alignment are otherwise normal.

Limited imaging of the abdomen is otherwise unremarkable.

The disc levels at L3-4 above are normal.

L4-5: Progressive moderate facet hypertrophy is again noted, left
greater than right. There is no significant stenosis or change.

L5-S1: Progressive facet hypertrophy is noted bilaterally. Mild disc
bulging is present. There is no significant stenosis or change.

There is continued progression of moderate epidural lipomatosis with
significant central canal stenosis at L4-5 and below.
IMPRESSION: 1. Continued progression of epidural lipomatosis resulting and
moderate to severe central canal stenosis at L4-5 and below.
2. Progressive facet hypertrophy at L4-5 and L5-S1 without
significant focal stenosis at either of these levels.

## 2017-08-19 ENCOUNTER — Inpatient Hospital Stay: Payer: Medicare Other | Attending: Oncology

## 2017-08-19 ENCOUNTER — Other Ambulatory Visit: Payer: Medicare Other

## 2017-08-19 ENCOUNTER — Ambulatory Visit: Payer: Medicare Other

## 2017-08-19 ENCOUNTER — Inpatient Hospital Stay: Payer: Medicare Other

## 2017-08-19 VITALS — BP 165/88 | HR 93 | Temp 98.3°F | Resp 16

## 2017-08-19 DIAGNOSIS — D509 Iron deficiency anemia, unspecified: Secondary | ICD-10-CM

## 2017-08-19 DIAGNOSIS — D631 Anemia in chronic kidney disease: Secondary | ICD-10-CM | POA: Diagnosis not present

## 2017-08-19 DIAGNOSIS — N289 Disorder of kidney and ureter, unspecified: Secondary | ICD-10-CM

## 2017-08-19 DIAGNOSIS — D508 Other iron deficiency anemias: Secondary | ICD-10-CM

## 2017-08-19 DIAGNOSIS — D563 Thalassemia minor: Secondary | ICD-10-CM

## 2017-08-19 DIAGNOSIS — N189 Chronic kidney disease, unspecified: Secondary | ICD-10-CM | POA: Insufficient documentation

## 2017-08-19 LAB — IRON AND TIBC
IRON: 125 ug/dL (ref 41–142)
Saturation Ratios: 47 % (ref 21–57)
TIBC: 268 ug/dL (ref 236–444)
UIBC: 143 ug/dL

## 2017-08-19 LAB — CBC WITH DIFFERENTIAL/PLATELET
Basophils Absolute: 0.1 10*3/uL (ref 0.0–0.1)
Basophils Relative: 1 %
EOS PCT: 3 %
Eosinophils Absolute: 0.2 10*3/uL (ref 0.0–0.5)
HEMATOCRIT: 32.5 % — AB (ref 34.8–46.6)
Hemoglobin: 10.1 g/dL — ABNORMAL LOW (ref 11.6–15.9)
LYMPHS ABS: 3 10*3/uL (ref 0.9–3.3)
LYMPHS PCT: 42 %
MCH: 20 pg — AB (ref 25.1–34.0)
MCHC: 30.9 g/dL — AB (ref 31.5–36.0)
MCV: 64.7 fL — ABNORMAL LOW (ref 79.5–101.0)
MONOS PCT: 13 %
Monocytes Absolute: 0.9 10*3/uL (ref 0.1–0.9)
NEUTROS ABS: 2.8 10*3/uL (ref 1.5–6.5)
Neutrophils Relative %: 41 %
PLATELETS: 325 10*3/uL (ref 145–400)
RBC: 5.03 MIL/uL (ref 3.70–5.45)
RDW: 17.5 % — AB (ref 11.2–14.5)
WBC: 7 10*3/uL (ref 3.9–10.3)

## 2017-08-19 LAB — FERRITIN: FERRITIN: 417 ng/mL — AB (ref 9–269)

## 2017-08-19 MED ORDER — DARBEPOETIN ALFA 300 MCG/0.6ML IJ SOSY
PREFILLED_SYRINGE | INTRAMUSCULAR | Status: AC
Start: 2017-08-19 — End: 2017-08-19
  Filled 2017-08-19: qty 0.6

## 2017-08-19 MED ORDER — DARBEPOETIN ALFA 300 MCG/0.6ML IJ SOSY
300.0000 ug | PREFILLED_SYRINGE | Freq: Once | INTRAMUSCULAR | Status: AC
  Administered 2017-08-19: 300 ug via SUBCUTANEOUS

## 2017-08-19 NOTE — Progress Notes (Signed)
Please give patient aranesp today with Hgb of 10.1.    VO given and read back from Dr. Alen Blew. Reviewed VS with Dr. Alen Blew.  B/P elevated at 176/90  And after 12 minutes 165/88.  Pulse at 93.   Patient states she took all her medications today.   VO given and read back from Dr. Alen Blew to proceed with Aranesp today.

## 2017-08-19 NOTE — Patient Instructions (Signed)

## 2017-08-21 ENCOUNTER — Ambulatory Visit: Payer: Self-pay | Admitting: *Deleted

## 2017-08-22 ENCOUNTER — Other Ambulatory Visit: Payer: Self-pay | Admitting: *Deleted

## 2017-08-22 NOTE — Patient Outreach (Signed)
[facility]) Care Management  08/22/2017   IRIEL NASON 07-31-1960 [id number]  RN Health Coach telephone call to patient.  [employer] compliance verified. Per patient she is having flu-like symptoms. Patient has not followed up with physician. She stated she is to see them next week. Patient stated she is drinking plenty of fluids and have looked at the sick day information that was given to her. Patient stated her fasting blood sugars are running around  97-119. Patient states she checks her blood sugars 3-4 times a day. Per patient she would like to have a free style Elenor Legato but the cost was to expensive. Per patient she has not checked with the [facility] center or the [facility] for an exercise program. Patient has agreed to follow up outreach.  Current Medications:  Current Outpatient Medications  Medication Sig Dispense Refill  . abatacept (ORENCIA) 250 MG injection Inject 1,000 mg into the vein every 28 (twenty-eight) days. Monthly Infusion    . albuterol (PROVENTIL HFA;VENTOLIN HFA) 108 (90 Base) MCG/ACT inhaler Inhale 2 puffs into the lungs every 6 (six) hours as needed for wheezing or shortness of breath.    Marland Kitchen amitriptyline (ELAVIL) 25 MG tablet Take 25 mg by mouth at bedtime.     . ARTIFICIAL TEAR OP Place 1 drop into both eyes as needed (for dry eyes).    . BYSTOLIC 10 MG tablet Take 20 mg by mouth daily.     . chlorthalidone (HYGROTON) 25 MG tablet Take 25 mg by mouth daily.    . CRESTOR 20 MG tablet Take 20 mg by mouth daily.     . cyclobenzaprine (FLEXERIL) 10 MG tablet Take 10 mg by mouth 3 (three) times daily as needed.     . diltiazem (CARDIZEM CD) 240 MG 24 hr capsule Take 240 mg by mouth daily.     . fluticasone (FLONASE) 50 MCG/ACT nasal spray Place 1 spray into both nostrils daily as needed for allergies or rhinitis.    Marland Kitchen gabapentin (NEURONTIN) 300 MG capsule Take 600 mg by mouth 3 (three) times daily.     Marland Kitchen HYDROcodone-acetaminophen (NORCO/VICODIN) 5-325 MG tablet  Take 1 tablet by mouth every 6 (six) hours as needed for moderate pain.    Marland Kitchen insulin aspart (NOVOLOG) 100 UNIT/ML injection Inject 55-60 Units into the skin 3 (three) times daily before meals. 55 base Plus sliding scale    . insulin NPH (HUMULIN N,NOVOLIN N) 100 UNIT/ML injection Inject 75 Units into the skin 3 (three) times daily with meals. 75 units with breakfast; lunch 75 units; 75 units at dinner    . irbesartan (AVAPRO) 300 MG tablet Take 300 mg by mouth daily.     . ISOtretinoin (ACCUTANE) 40 MG capsule Take 40 mg by mouth daily.    Marland Kitchen leflunomide (ARAVA) 20 MG tablet Take 20 mg by mouth daily.    . magnesium oxide (MAG-OX) 400 MG tablet Take 400 mg by mouth daily.    . meclizine (ANTIVERT) 25 MG tablet Take 25 mg by mouth 3 (three) times daily as needed for dizziness.    . meloxicam (MOBIC) 7.5 MG tablet Take 1 tablet (7.5 mg total) by mouth daily. 30 tablet 0  . metFORMIN (GLUCOPHAGE-XR) 500 MG 24 hr tablet Take 500 mg by mouth every evening.    . pantoprazole (PROTONIX) 40 MG tablet Take 40 mg by mouth daily.    Marland Kitchen tiZANidine (ZANAFLEX) 4 MG tablet Take 4 mg by mouth 2 (two) times daily as needed (  migraines). Limit 2 days per week    . topiramate (TOPAMAX) 200 MG tablet Take 200 mg by mouth daily.    . TRULICITY 7.62 GB/1.5VV SOPN Inject 0.75 mg into the skin once a week. Patient takes on Wednesday.    . Vitamin D, Ergocalciferol, (DRISDOL) 50000 units CAPS capsule Take 50,000 Units by mouth every 7 (seven) days.    . VOLTAREN 1 % GEL Apply 1 application topically 3 (three) times daily as needed (for pain).      No current facility-administered medications for this visit.     Functional Status:  In your present state of health, do you have any difficulty performing the following activities: 08/22/2017 07/24/2017  Hearing? Y Y  Comment - -  Vision? N N  Comment - -  Difficulty concentrating or making decisions? N N  Walking or climbing stairs? N N  Dressing or bathing? N -  Doing  errands, shopping? Y Y  Preparing Food and eating ? - N  Using the Toilet? N N  In the past six months, have you accidently leaked urine? N N  Do you have problems with loss of bowel control? - N  Managing your Medications? N N  Managing your Finances? N N  Housekeeping or managing your Housekeeping? Y Y  Some recent data might be hidden    Fall/Depression Screening: Fall Risk  08/22/2017 07/24/2017 06/05/2017  Falls in the past year? - No No  Number falls in past yr: - - -  Risk for fall due to : Impaired balance/gait Impaired balance/gait;Impaired mobility Impaired balance/gait;Impaired mobility   PHQ 2/9 Scores 08/22/2017 07/24/2017 06/05/2017 05/07/2017 03/13/2017 03/07/2017 01/30/2017  PHQ - 2 Score 0 0 0 0 0 0 0   THN CM Care Plan Problem One     Most Recent Value  Care Plan Problem One  Knowledge Deficit in self management of diabetes  Role Documenting the Problem One  [place] for Problem One  Active  THN Long Term Goal   Patient will report a decrease in weight  from 268 pounds within the next 90 days  Interventions for Problem One Long Term Goal  RN reiterated with patient about portion contol, and exercises. RN reiterated eating healthy and made patient aware that her weight has increased since last outreach. RN will encourage and support patient in weight loss process. RN will follow up outreach with further discussion and teach back  THN CM Short Term Goal #1   Patient will begin a routine exercise program and adhere within the next 30 days  THN CM Short Term Goal #1 Start Date  08/22/17  Interventions for Short Term Goal #1  RN reiterated the importance of weight loss and exercise.  Per Patient she will look into the [place] [facility]  and [facility]. RN sent patient a March claendar information on the [facility] . Patient goal is to start classes by next outreach call. RN will follow up for compliance  THN CM Short Term Goal #2   Patient will report that she has  developed a routine chair exercise program with 30 days  THN CM Short Term Goal #2 Start Date  08/22/17  Interventions for Short Term Goal #2  RN discussed with patient about a need for a routine exercise program. Patient has found a chair exercise program on tv. RN will send a picture chart of chair exercises. RN will follow up with patient for adherence and discussion.  Assessment:  Per patient she is having flu like symptoms Patient has an appointment next week Patient has not followed up with the routine exercise program Patient will continue to benefit from [place] telephonic outreach for education and support for diabetes self management.  Plan:  RN sent sheet on chair exercises RN sent March copy of [facility] news on exercise program RN discussed routine  exercise plan  RN referred to pharmacy on [employer] RN discussed sick days for diabetics RN will follow up within the month of April  Wendee Hata [place] Care Management [phone]

## 2017-08-26 ENCOUNTER — Encounter: Payer: Self-pay | Admitting: Podiatry

## 2017-08-26 ENCOUNTER — Ambulatory Visit (INDEPENDENT_AMBULATORY_CARE_PROVIDER_SITE_OTHER): Payer: Medicare Other | Admitting: Podiatry

## 2017-08-26 DIAGNOSIS — M2141 Flat foot [pes planus] (acquired), right foot: Secondary | ICD-10-CM | POA: Diagnosis not present

## 2017-08-26 DIAGNOSIS — E1140 Type 2 diabetes mellitus with diabetic neuropathy, unspecified: Secondary | ICD-10-CM | POA: Diagnosis not present

## 2017-08-26 DIAGNOSIS — M2142 Flat foot [pes planus] (acquired), left foot: Secondary | ICD-10-CM

## 2017-08-26 DIAGNOSIS — E1149 Type 2 diabetes mellitus with other diabetic neurological complication: Secondary | ICD-10-CM | POA: Diagnosis not present

## 2017-08-26 DIAGNOSIS — M76829 Posterior tibial tendinitis, unspecified leg: Secondary | ICD-10-CM

## 2017-08-26 NOTE — Progress Notes (Signed)
This patient presents the office for continued evaluation of her painful left foot.  She was diagnosed as having a posterior tibial tendon dysfunction on the left foot and  plantar fasciitis due to pes planus.  She was treated with a compression stocking to be worn on her foot and a prescription of Mobic was called into the pharmacy for this patient.  She says that her foot has improved but she still is experiencing pain and discomfort.  She does admit that the burning that she was experiencing in her left foot has improved and resolved.  She says that her pain level is 6 out of 10, which is improved from her last visit.  She presents the office today for continued evaluation and treatment of this painful left foot   General Appearance  Alert, conversant and in no acute stress.  Vascular  Dorsalis pedis and posterior tibial  pulses are palpable  bilaterally.  Capillary return is within normal limits  bilaterally. Temperature is within normal limits  bilaterally.  Neurologic  Senn-Weinstein monofilament wire test within normal limits  bilaterally. Muscle power within normal limits bilaterally.  Nails Thick disfigured discolored nails with subungual debris  from hallux to fifth toes bilaterally. No evidence of bacterial infection or drainage bilaterally.  Orthopedic  No limitations of motion of motion feet .  No crepitus or effusions noted.  Severe pes planus noted bilaterally. Pain in the swelling at the insertion of the posterior tibial tendon has resolved.    Skin  normotropic skin with no porokeratosis noted bilaterally.  No signs of infections or ulcers noted.    PTTD left  Plantar fascitis left  Pes planus   ROV  Discussed her painful foot and the improvement that has occurred.  During her initial exam. There was an acute flare up in her left foot. This has resolved and now she has chronic pain in her left foot do to her pes  Planus.  I told her she would benefit from an evaluation by the  pedorthist  in this office.  She will make an appointment with Liliane Channel.   Gardiner Barefoot DPM

## 2017-08-27 ENCOUNTER — Encounter (HOSPITAL_COMMUNITY): Payer: Self-pay | Admitting: Emergency Medicine

## 2017-08-27 ENCOUNTER — Other Ambulatory Visit (HOSPITAL_COMMUNITY): Payer: Self-pay

## 2017-08-27 ENCOUNTER — Emergency Department (HOSPITAL_COMMUNITY)
Admission: EM | Admit: 2017-08-27 | Discharge: 2017-08-27 | Disposition: A | Discharge status: Home / Self Care | Payer: Medicare Other | Attending: Emergency Medicine | Admitting: Emergency Medicine

## 2017-08-27 DIAGNOSIS — I110 Hypertensive heart disease with heart failure: Secondary | ICD-10-CM | POA: Insufficient documentation

## 2017-08-27 DIAGNOSIS — Z79899 Other long term (current) drug therapy: Secondary | ICD-10-CM | POA: Insufficient documentation

## 2017-08-27 DIAGNOSIS — J45909 Unspecified asthma, uncomplicated: Secondary | ICD-10-CM | POA: Insufficient documentation

## 2017-08-27 DIAGNOSIS — E1140 Type 2 diabetes mellitus with diabetic neuropathy, unspecified: Secondary | ICD-10-CM | POA: Diagnosis not present

## 2017-08-27 DIAGNOSIS — Z87891 Personal history of nicotine dependence: Secondary | ICD-10-CM | POA: Insufficient documentation

## 2017-08-27 DIAGNOSIS — Z794 Long term (current) use of insulin: Secondary | ICD-10-CM | POA: Insufficient documentation

## 2017-08-27 DIAGNOSIS — I5032 Chronic diastolic (congestive) heart failure: Secondary | ICD-10-CM | POA: Insufficient documentation

## 2017-08-27 DIAGNOSIS — M5441 Lumbago with sciatica, right side: Secondary | ICD-10-CM | POA: Diagnosis not present

## 2017-08-27 DIAGNOSIS — M545 Low back pain: Secondary | ICD-10-CM | POA: Diagnosis present

## 2017-08-27 MED ORDER — OXYCODONE-ACETAMINOPHEN 5-325 MG PO TABS
2.0000 | ORAL_TABLET | Freq: Once | ORAL | Status: AC
  Administered 2017-08-27: 2 via ORAL
  Filled 2017-08-27: qty 2

## 2017-08-27 MED ORDER — HYDROMORPHONE HCL 1 MG/ML IJ SOLN
1.0000 mg | Freq: Once | INTRAMUSCULAR | Status: AC
  Administered 2017-08-27: 1 mg via INTRAMUSCULAR
  Filled 2017-08-27: qty 1

## 2017-08-27 MED ORDER — PREDNISONE 10 MG (21) PO TBPK: ORAL_TABLET | ORAL | 0 refills | Status: DC

## 2017-08-27 MED ORDER — PREDNISONE 20 MG PO TABS
60.0000 mg | ORAL_TABLET | Freq: Once | ORAL | Status: AC
  Administered 2017-08-27: 60 mg via ORAL
  Filled 2017-08-27: qty 3

## 2017-08-27 MED ORDER — LIDOCAINE 5 % EX PTCH: 1.0000 | MEDICATED_PATCH | CUTANEOUS | 0 refills | Status: DC

## 2017-08-27 MED ORDER — NAPROXEN 500 MG PO TABS: 500.0000 mg | ORAL_TABLET | Freq: Two times a day (BID) | ORAL | 0 refills | Status: AC

## 2017-08-27 MED ORDER — NAPROXEN 500 MG PO TABS: 500.0000 mg | ORAL_TABLET | Freq: Two times a day (BID) | ORAL | 0 refills | Status: DC

## 2017-08-27 MED ORDER — LIDOCAINE 5 % EX PTCH: 1.0000 | MEDICATED_PATCH | CUTANEOUS | 0 refills | Status: AC

## 2017-08-27 NOTE — Discharge Instructions (Signed)
You were seen in the emergency department today for back pain- your history and physical exam make [place] suspicious that you have sciatica of the right side.   We have given you multiple medications today to treat your symptoms:  - Naproxen is a nonsteroidal anti-inflammatory medication that will help with pain and swelling. Be sure to take this medication as prescribed with food, 1 pill every 12 hours,  It should be taken with food, as it can cause stomach upset, and more seriously, stomach bleeding. Do not take other nonsteroidal anti-inflammatory medications with this such as Advil, Motrin, Mobic/meloxicam, or Aleve.  - Lidoderm patches- these are topical patches you place over areas of discomfort that you may apply daily. If these are overly expensive ask the pharmacist about the over the counter version as these are similar.   You may continue to take your prescribed hydrocodone and Flexeril for the pain- do not drive or operate heavy machinery with these medications.   I have prescribed new medications for you today. It is important that when you pick the prescription up you discuss the potential interactions of this medication with other medications you are taking, including over the counter medications, with the pharmacists.   This new medication has potential side effects. Be sure to contact your primary care provider or return to the emergency department if you are experiencing new symptoms that you are unable to tolerate after starting the medication. You need to receive medical evaluation immediately if you start to experience blistering of the skin, rash, swelling, or difficulty breathing as these signs could indicate a more serious medication side effect.    The application of heat can help soothe the pain.  Maintaining your daily activities, including walking, is encourged, as it will help you get better faster than just staying in bed.  Your pain should get better over the next week.  You  will need to follow up with  Your primary healthcare provider for reassessment within the next 1 week. if you do not have a primary care provider one is provided in your discharge instructions. However if you develop severe or worsening pain, low back pain with fever, numbness, weakness, loss of bowel or bladder control, or inability to walk or urinate, you should return to the ER immediately.  Please follow up with your doctor this week for a recheck if still having symptoms.  Your vital signs today were: BP (!) 152/73 (BP Location: Left Arm)    Pulse 77    Temp 99.2 F (37.3 C) (Oral)    Resp 18    Ht 5\' 3"  (1.6 m)    Wt 117.9 kg (260 lb)    LMP 12/27/2013 Comment: irreggular   SpO2 98%    BMI 46.06 kg/m  If your blood pressure (BP) was elevated above 135/85 this visit, please have this repeated by your doctor within one month. ---------------

## 2017-08-27 NOTE — ED Triage Notes (Addendum)
Pt reports back pain ongoing since Sunday, reports hydrocodone and muscle relaxers not effective. Reports increased difficulty with ambulation, states pain makes it worse. States no incontinence, constipation.

## 2017-08-27 NOTE — ED Provider Notes (Signed)
[facility] EMERGENCY DEPARTMENT Provider Note   CSN: [id number] Arrival date & time: 08/27/17  0636     History   Chief Complaint Chief Complaint  Patient presents with  . Back Pain    HPI Jessica Howard is a 57 y.o. female with a hx of asthma, DM with neuropathy, HTN, and rheumatoid arthritis who presents to the ED with complaint of back pain for the past 4 days. Patient describes the pain as being to the R lower back radiating down the RLE. States pain is constant, rates it a 10/10 in severity, it is worse with twisting/turning motions as well as ambulating, no specific alleviating factors. Has tried hydrocodone and flexeril at home for pain. States the RLE feels somewhat weak more so secondarily to pain. Denies numbness, tingling, incontinence to bowel/bladder, fever, chills, IV drug use, or hx of cancer. Blood glucose levels have been running 100-150.   HPI  Past Medical History:  Diagnosis Date  . Asthma   . Diabetes mellitus    type 2  . Diabetic neuropathy ([place])   . Endometrial polyp   . Fibroadenoma of breast    left  . GERD (gastroesophageal reflux disease)   . Headache    migraines  . Hepatic steatosis   . Hyperlipidemia   . Hypertension   . Impingement syndrome of right shoulder   . Nontraumatic tear of right rotator cuff   . Pneumonia   . Psoriasis   . Rheumatoid arthritis(714.0)    rhematoid and osteoarthritis  . Sleep apnea   . Thyroid disease    had radiation  . Unspecified deficiency anemia    Thalassemia    Patient Active Problem List   Diagnosis Date Noted  . Elevated C-reactive protein (CRP)   . Elevated erythrocyte sedimentation rate   . Leukocytosis   . Hip joint effusion 01/20/2016  . Right hip pain 01/19/2016  . Hip pain 01/19/2016  . Congestive heart failure (CHF) ([place]) 12/01/2015  . Anemia 12/01/2015  . CHF (congestive heart failure) ([place]) 12/01/2015  . Acute diastolic CHF (congestive heart failure) ([place])   .  Right rotator cuff tear 05/20/2014  . Nontraumatic tear of right rotator cuff   . Impingement syndrome of right shoulder   . Thalassemia 02/28/2014  . On prednisone therapy 02/28/2014  . Rheumatoid arthritis ([place]) 02/28/2014  . Morbid obesity ([place]) 02/28/2014  . Shortness of breath 02/28/2014  . Renal insufficiency 02/28/2014  . Edema 02/28/2014  . Back pain 02/28/2014  . Essential hypertension 02/28/2014  . Diabetes ([place]) 02/28/2014  . Dyslipidemia 02/28/2014  . Obstructive sleep apnea 02/28/2014  . Acute on chronic diastolic congestive heart failure, NYHA class 2 ([place]) 02/28/2014  . Chest pain 02/27/2014  . Iron deficiency anemia 02/16/2014  . Post-menopausal bleeding 08/28/2012    Past Surgical History:  Procedure Laterality Date  . ANKLE ARTHROSCOPY Right   . CESAREAN SECTION    . COLONOSCOPY    . DILATION AND CURETTAGE OF UTERUS    . EYE SURGERY     surgery on retina  . HAND SURGERY     for arthritis  . left knee surgery nodule removal    . OTHER SURGICAL HISTORY     multiple nodule removal on neck, hands, left knee  . SHOULDER ARTHROSCOPY WITH ROTATOR CUFF REPAIR AND SUBACROMIAL DECOMPRESSION Right 05/20/2014   Procedure: RIGHT SHOULDER ARTHROSCOPY WITH DEBRIDEMENT/DISTAL CLAVICLE EXCISION/ROTATOR CUFF REPAIR AND SUBACROMIAL DECOMPRESSION;  Surgeon: Lorn Junes, MD;  Location: [facility];  Service: Orthopedics;  Laterality: Right;  . TONSILLECTOMY    . TUBAL LIGATION     bilateral    OB History    Gravida Para Term Preterm AB Living   3 2 2  0 1 2   SAB TAB Ectopic Multiple Live Births   1 0 0 0         Home Medications    Prior to Admission medications   Medication Sig Start Date End Date Taking? Authorizing Provider  abatacept (ORENCIA) 250 MG injection Inject 1,000 mg into the vein every 28 (twenty-eight) days. Monthly Infusion    [provider]  albuterol (PROVENTIL HFA;VENTOLIN HFA) 108 (90 Base) MCG/ACT inhaler Inhale 2 puffs into the lungs  every 6 (six) hours as needed for wheezing or shortness of breath.    [provider]  amitriptyline (ELAVIL) 25 MG tablet Take 25 mg by mouth at bedtime.     [provider]  ARTIFICIAL TEAR OP Place 1 drop into both eyes as needed (for dry eyes).    [provider]  BYSTOLIC 10 MG tablet Take 20 mg by mouth daily.  10/07/16   [provider]  chlorthalidone (HYGROTON) 25 MG tablet Take 25 mg by mouth daily.    [provider]  CRESTOR 20 MG tablet Take 20 mg by mouth daily.  03/14/14   [provider]  cyclobenzaprine (FLEXERIL) 10 MG tablet Take 10 mg by mouth 3 (three) times daily as needed.  11/20/16   [provider]  diltiazem (CARDIZEM CD) 240 MG 24 hr capsule Take 240 mg by mouth daily.  02/03/14   [provider]  fluticasone (FLONASE) 50 MCG/ACT nasal spray Place 1 spray into both nostrils daily as needed for allergies or rhinitis.    [provider]  gabapentin (NEURONTIN) 300 MG capsule Take 600 mg by mouth 3 (three) times daily.     [provider]  HYDROcodone-acetaminophen (NORCO/VICODIN) 5-325 MG tablet Take 1 tablet by mouth every 6 (six) hours as needed for moderate pain.    [provider]  insulin aspart (NOVOLOG) 100 UNIT/ML injection Inject 55-60 Units into the skin 3 (three) times daily before meals. 55 base Plus sliding scale    [provider]  insulin NPH (HUMULIN N,NOVOLIN N) 100 UNIT/ML injection Inject 75 Units into the skin 3 (three) times daily with meals. 75 units with breakfast; lunch 75 units; 75 units at dinner    [provider]  irbesartan (AVAPRO) 300 MG tablet Take 300 mg by mouth daily.     [provider]  ISOtretinoin (ACCUTANE) 40 MG capsule Take 40 mg by mouth daily.    [provider]  leflunomide (ARAVA) 20 MG tablet Take 20 mg by mouth daily.    [provider]  magnesium oxide (MAG-OX) 400 MG tablet Take 400 mg by  mouth daily.    [provider]  meclizine (ANTIVERT) 25 MG tablet Take 25 mg by mouth 3 (three) times daily as needed for dizziness.    [provider]  meloxicam (MOBIC) 7.5 MG tablet Take 1 tablet (7.5 mg total) by mouth daily. 08/01/17   Gardiner Barefoot, DPM  metFORMIN (GLUCOPHAGE-XR) 500 MG 24 hr tablet Take 500 mg by mouth every evening.    [provider]  pantoprazole (PROTONIX) 40 MG tablet Take 40 mg by mouth daily.    [provider]  tiZANidine (ZANAFLEX) 4 MG tablet Take 4 mg by mouth 2 (two)  times daily as needed (migraines). Limit 2 days per week 01/12/14   [provider]  topiramate (TOPAMAX) 200 MG tablet Take 200 mg by mouth daily. 07/20/16   [provider]  TRULICITY 2.37 SE/8.3TD SOPN Inject 0.75 mg into the skin once a week. Patient takes on Wednesday. 08/29/15   [provider]  Vitamin D, Ergocalciferol, (DRISDOL) 50000 units CAPS capsule Take 50,000 Units by mouth every 7 (seven) days.    [provider]  VOLTAREN 1 % GEL Apply 1 application topically 3 (three) times daily as needed (for pain).  02/03/14   [provider]    Family History Family History  Problem Relation Age of Onset  . Diabetes Mother   . Cancer Mother        breast  . Aneurysm Father        brain  . Diabetes Sister   . Cancer Brother        bone marrow cancer    Social History Social History   Tobacco Use  . Smoking status: Former Smoker    Packs/day: 0.25    Years: 6.00    Pack years: 1.50    Types: Cigarettes    Last attempt to quit: 06/09/2013    Years since quitting: 4.2  . Smokeless tobacco: Never Used  Substance Use Topics  . Alcohol use: No  . Drug use: No     Allergies   Bactrim [sulfamethoxazole-trimethoprim]; Cefuroxime axetil; Cephalosporins; Iohexol; Lisinopril; Penicillins; and Sulfa antibiotics   Review of Systems Review of Systems  Constitutional: Negative for chills and fever.    Gastrointestinal: Negative for abdominal pain, nausea and vomiting.  Genitourinary: Negative for dysuria.  Musculoskeletal: Positive for back pain and gait problem (painful but able).  Neurological: Positive for weakness (RLE secondary to pain). Negative for numbness.       Negative for incontinence to bowel/bladder or saddle anesthesia   Physical Exam Updated Vital Signs BP (!) 152/73 (BP Location: Left Arm)   Pulse 77   Temp 99.2 F (37.3 C) (Oral)   Resp 18   Ht 5\' 3"  (1.6 m)   Wt 117.9 kg (260 lb)   LMP 12/27/2013 Comment: irreggular  SpO2 98%   BMI 46.06 kg/m   Physical Exam  Constitutional: She appears well-developed and well-nourished. She appears distressed (mild secondary to pain).  HENT:  Head: Normocephalic and atraumatic.  Eyes: Conjunctivae are normal. Right eye exhibits no discharge. Left eye exhibits no discharge.  Cardiovascular: Normal rate and regular rhythm.  Pulmonary/Chest: Effort normal and breath sounds normal. No respiratory distress.  Musculoskeletal:  Back: No obvious deformity, appreciable swelling, ecchymosis, erythema, or overlying warmth. There is mild diffuse lumbar midline tenderness- non focal. More prominent tenderness to palpation to R lumbar paraspinal muscles extending to gluteal region.   Neurological: She is alert.  Clear speech. Sensation grossly intact to bilateral LEs. 5/5 strength with plantar/dorsi flexion bilaterally. Patellar DTRs 2+ and symmetric. Gait antalgic but intact. Pain reproducible with RLE straight leg raise.   Psychiatric: She has a normal mood and affect. Her behavior is normal. Thought content normal.  Nursing note and vitals reviewed.   ED Treatments / Results  Labs (all labs ordered are listed, but only abnormal results are displayed) Labs Reviewed - No data to display  EKG  EKG Interpretation None       Radiology No results found.  Procedures Procedures (including critical care time)  Medications  Ordered in ED Medications  oxyCODONE-acetaminophen (PERCOCET/ROXICET) 5-325  MG per tablet 2 tablet (2 tablets Oral Given 08/27/17 0942)  HYDROmorphone (DILAUDID) injection 1 mg (1 mg Intramuscular Given 08/27/17 1033)  predniSONE (DELTASONE) tablet 60 mg (60 mg Oral Given 08/27/17 1033)    Initial Impression / Assessment and Plan / ED Course  I have reviewed the triage vital signs and the nursing notes.  Pertinent labs & imaging results that were available during my care of the patient were reviewed by me and considered in my medical decision making (see chart for details).    Patient presents with complaint of back pain.  Patient is nontoxic appearing, vitals are WNL with exception of somewhat elevated BP- no indication of HTN emergency- discussed with patient the need for recheck by PCP. Patient has no neurological deficits or point vertebral tenderness.  Patient can walk but states is painful.  No loss of bowel or bladder control. No saddle anesthesia.  No concern for cauda equina.  No fever, night sweats, weight loss, h/o cancer, or IVDU.  Her presentation appears consistent with sciatica. Given pain medication and 1 dose of prednisone in the ED. Will DC home with Naproxen and Lidoderm patches. Discussed continuation of her prescribed Flexeril and Hydrocodone- discussed driving/heavy machinery operation precautions with these medications. Prednisone prescription avoided given hx of DM.  I discussed treatment plan, need for PCP follow-up, and return precautions with the patient. Provided opportunity for questions, patient confirmed understanding and is in agreement with plan.   Findings and plan of care discussed with supervising physician Dr. Gilford Raid who evaluated the patient and is in agreement.   Final Clinical Impressions(s) / ED Diagnoses   Final diagnoses:  Acute right-sided low back pain with right-sided sciatica    ED Discharge Orders        Ordered    predniSONE (STERAPRED UNI-PAK  21 TAB) 10 MG (21) TBPK tablet     08/27/17 1014    naproxen (NAPROSYN) 500 MG tablet  2 times daily     08/27/17 1014    lidocaine (LIDODERM) 5 %  Every 24 hours     08/27/17 1014       Camrie Stock, [place] R, PA-C 08/27/17 1117    Isla Pence, MD 08/27/17 1120

## 2017-08-27 NOTE — ED Notes (Signed)
Unable to urinate at this time.  

## 2017-08-28 ENCOUNTER — Encounter (HOSPITAL_COMMUNITY): Payer: Medicare Other

## 2017-08-28 ENCOUNTER — Other Ambulatory Visit: Payer: Medicare Other

## 2017-08-29 ENCOUNTER — Telehealth: Payer: Self-pay | Admitting: Pharmacist

## 2017-08-29 DIAGNOSIS — M5441 Lumbago with sciatica, right side: Secondary | ICD-10-CM | POA: Diagnosis not present

## 2017-08-29 NOTE — Patient Outreach (Signed)
[facility]) Care Management  08/29/2017  BREKYN HUNTOON Oct 09, 1960 [id number]  57 y.o. year old female referred to [facility] for Medication Assistance (Pharmacy telephone outreach #1) Original consult reason = affordability of Freestyle Libre Continuous Glucose Monitor  Was unable to reach patient via telephone today and have left HIPAA compliant voicemail asking patient to return my call (unsuccessful outreach #1).  Called patient's pharmacy ([facility]) they state that there is no prescription for Freestyle Libre Continuous Glucose Monitor on file. No other pharmacies listed in the chart.   Plan: Will followup within 2 business days via telephone   Carlean Jews, [place].D., BCPS PGY2 Ambulatory Care Pharmacy Resident Phone: [phone]

## 2017-09-02 ENCOUNTER — Ambulatory Visit: Payer: Self-pay | Admitting: Pharmacist

## 2017-09-02 ENCOUNTER — Other Ambulatory Visit: Payer: Medicare Other | Admitting: Orthotics

## 2017-09-02 ENCOUNTER — Telehealth: Payer: Self-pay | Admitting: Pharmacist

## 2017-09-02 DIAGNOSIS — D649 Anemia, unspecified: Secondary | ICD-10-CM | POA: Diagnosis not present

## 2017-09-02 DIAGNOSIS — G609 Hereditary and idiopathic neuropathy, unspecified: Secondary | ICD-10-CM | POA: Diagnosis not present

## 2017-09-02 DIAGNOSIS — N390 Urinary tract infection, site not specified: Secondary | ICD-10-CM | POA: Diagnosis not present

## 2017-09-02 DIAGNOSIS — M549 Dorsalgia, unspecified: Secondary | ICD-10-CM | POA: Diagnosis not present

## 2017-09-02 DIAGNOSIS — M5440 Lumbago with sciatica, unspecified side: Secondary | ICD-10-CM | POA: Diagnosis not present

## 2017-09-02 DIAGNOSIS — M8580 Other specified disorders of bone density and structure, unspecified site: Secondary | ICD-10-CM | POA: Diagnosis not present

## 2017-09-02 DIAGNOSIS — M150 Primary generalized (osteo)arthritis: Secondary | ICD-10-CM | POA: Diagnosis not present

## 2017-09-02 DIAGNOSIS — Z79899 Other long term (current) drug therapy: Secondary | ICD-10-CM | POA: Diagnosis not present

## 2017-09-02 DIAGNOSIS — M069 Rheumatoid arthritis, unspecified: Secondary | ICD-10-CM | POA: Diagnosis not present

## 2017-09-02 DIAGNOSIS — M5136 Other intervertebral disc degeneration, lumbar region: Secondary | ICD-10-CM | POA: Diagnosis not present

## 2017-09-02 NOTE — Patient Outreach (Signed)
[facility]) Care Management  09/02/2017  AOKI WEDEMEYER [birth year] [id number]   57 y.o. year old female referred to [facility] for Medication Assistance (Pharmacy outreach #2)  Original consult reason = affordability of Freestyle Libre Continuous Glucose Monitor  Was unable to reach patient via telephone today and have left HIPAA compliant voicemail asking patient to return my call (unsuccessful outreach #2).  Plan: Will followup within 10 business days via telephone Will send unsuccessful outreach attempt letter to patient   Carlean Jews, Pharm.D., BCPS PGY2 Ambulatory Care Pharmacy Resident Phone: [phone]

## 2017-09-04 ENCOUNTER — Encounter (HOSPITAL_COMMUNITY): Payer: Self-pay | Admitting: Emergency Medicine

## 2017-09-04 ENCOUNTER — Other Ambulatory Visit: Payer: Self-pay

## 2017-09-04 ENCOUNTER — Emergency Department (HOSPITAL_COMMUNITY)
Admission: EM | Admit: 2017-09-04 | Discharge: 2017-09-04 | Disposition: A | Discharge status: Home / Self Care | Payer: Medicare Other | Source: Home / Self Care | Attending: Physician Assistant | Admitting: Physician Assistant

## 2017-09-04 DIAGNOSIS — I5030 Unspecified diastolic (congestive) heart failure: Secondary | ICD-10-CM

## 2017-09-04 DIAGNOSIS — G8929 Other chronic pain: Secondary | ICD-10-CM | POA: Insufficient documentation

## 2017-09-04 DIAGNOSIS — I10 Essential (primary) hypertension: Secondary | ICD-10-CM | POA: Diagnosis not present

## 2017-09-04 DIAGNOSIS — M545 Low back pain: Secondary | ICD-10-CM | POA: Diagnosis not present

## 2017-09-04 DIAGNOSIS — M069 Rheumatoid arthritis, unspecified: Secondary | ICD-10-CM

## 2017-09-04 DIAGNOSIS — R4182 Altered mental status, unspecified: Secondary | ICD-10-CM | POA: Diagnosis not present

## 2017-09-04 DIAGNOSIS — Z794 Long term (current) use of insulin: Secondary | ICD-10-CM | POA: Insufficient documentation

## 2017-09-04 DIAGNOSIS — R4020 Unspecified coma: Secondary | ICD-10-CM | POA: Diagnosis not present

## 2017-09-04 DIAGNOSIS — I330 Acute and subacute infective endocarditis: Secondary | ICD-10-CM | POA: Diagnosis not present

## 2017-09-04 DIAGNOSIS — Z87891 Personal history of nicotine dependence: Secondary | ICD-10-CM

## 2017-09-04 DIAGNOSIS — N179 Acute kidney failure, unspecified: Secondary | ICD-10-CM | POA: Diagnosis not present

## 2017-09-04 DIAGNOSIS — J45909 Unspecified asthma, uncomplicated: Secondary | ICD-10-CM | POA: Insufficient documentation

## 2017-09-04 DIAGNOSIS — I110 Hypertensive heart disease with heart failure: Secondary | ICD-10-CM

## 2017-09-04 DIAGNOSIS — R6521 Severe sepsis with septic shock: Secondary | ICD-10-CM | POA: Diagnosis not present

## 2017-09-04 DIAGNOSIS — D649 Anemia, unspecified: Secondary | ICD-10-CM | POA: Diagnosis not present

## 2017-09-04 DIAGNOSIS — I5043 Acute on chronic combined systolic (congestive) and diastolic (congestive) heart failure: Secondary | ICD-10-CM | POA: Diagnosis not present

## 2017-09-04 DIAGNOSIS — A4101 Sepsis due to Methicillin susceptible Staphylococcus aureus: Secondary | ICD-10-CM | POA: Diagnosis not present

## 2017-09-04 DIAGNOSIS — M25532 Pain in left wrist: Secondary | ICD-10-CM | POA: Diagnosis not present

## 2017-09-04 DIAGNOSIS — Z79899 Other long term (current) drug therapy: Secondary | ICD-10-CM | POA: Insufficient documentation

## 2017-09-04 DIAGNOSIS — G9340 Encephalopathy, unspecified: Secondary | ICD-10-CM | POA: Diagnosis not present

## 2017-09-04 DIAGNOSIS — K7200 Acute and subacute hepatic failure without coma: Secondary | ICD-10-CM | POA: Diagnosis not present

## 2017-09-04 DIAGNOSIS — M79674 Pain in right toe(s): Secondary | ICD-10-CM | POA: Diagnosis not present

## 2017-09-04 DIAGNOSIS — M79642 Pain in left hand: Secondary | ICD-10-CM | POA: Diagnosis not present

## 2017-09-04 DIAGNOSIS — E119 Type 2 diabetes mellitus without complications: Secondary | ICD-10-CM | POA: Insufficient documentation

## 2017-09-04 DIAGNOSIS — N170 Acute kidney failure with tubular necrosis: Secondary | ICD-10-CM | POA: Diagnosis not present

## 2017-09-04 DIAGNOSIS — M79671 Pain in right foot: Secondary | ICD-10-CM | POA: Diagnosis not present

## 2017-09-04 MED ORDER — HYDROMORPHONE HCL 1 MG/ML IJ SOLN
1.0000 mg | Freq: Once | INTRAMUSCULAR | Status: AC
  Administered 2017-09-04: 1 mg via INTRAMUSCULAR
  Filled 2017-09-04: qty 1

## 2017-09-04 MED ORDER — METHOCARBAMOL 500 MG PO TABS: 1000.0000 mg | ORAL_TABLET | Freq: Four times a day (QID) | ORAL | 0 refills | Status: AC | PRN

## 2017-09-04 MED ORDER — ONDANSETRON 4 MG PO TBDP
4.0000 mg | ORAL_TABLET | Freq: Once | ORAL | Status: AC
  Administered 2017-09-04: 4 mg via ORAL
  Filled 2017-09-04: qty 1

## 2017-09-04 NOTE — ED Triage Notes (Signed)
Seen recently in ED for lower back pain radiating to buttocks. States continue to have pain and developed left hand and right toe swelling. States unable to sleep due to pain and unable to make it to the bathroom due to difficulty with ambulating and at times urinates on self.

## 2017-09-04 NOTE — ED Provider Notes (Signed)
   Patient placed in Quick Look pathway, seen and evaluated   Chief Complaint: joint pain  HPI:   Jessica Howard is a 57 y.o. female who presents to the ED with joint pain evaluated in the ED on 3/20 for same and treated with steroids, NSAIDs and topical pain cream. Patient followed up with PCP 3 days later and was given oxycodone and flexeril. Patient continued to have joint swelling and pain so she returns here today for recheck.    ROS: MS: joint pain  Physical Exam:  BP 105/81 (BP Location: Right Arm)   Pulse 84   Temp 98.1 F (36.7 C) (Oral)   Resp 18   Ht 5\' 6"  (1.676 m)   Wt 117.9 kg (260 lb)   LMP 12/27/2013 Comment: irreggular  BMI 41.97 kg/m    Gen: No distress  Neuro: Awake and Alert  Skin: Warm and dry, joint swelling left wrist  MS: joint swelling and tenderness  Focused Exam:   Initiation of care has begun. The patient has been counseled on the process, plan, and necessity for staying for the completion/evaluation, and the remainder of the medical screening examination    Ashley Murrain, NP 09/04/17 1338    Macarthur Critchley, MD 08/21/2017 2009

## 2017-09-04 NOTE — ED Provider Notes (Signed)
[facility] EMERGENCY DEPARTMENT Provider Note   CSN: [id number] Arrival date & time: 09/04/17  1305     History   Chief Complaint Chief Complaint  Patient presents with  . Muscle Pain  . Joint Swelling  . Back Pain   HPI  Blood pressure 99/80, pulse 88, temperature 98.1 F (36.7 C), temperature source Oral, resp. rate 16, height 5\' 6"  (1.676 m), weight 117.9 kg (260 lb), last menstrual period 12/27/2013, SpO2 96 %.  Jessica Howard is a 57 y.o. female medical history significant for rheumatoid arthritis complaining of diffuse pain and joint swelling, typically right toe and left hand.  She has low back pain radiating to the buttocks with no incontinence, numbness or weakness.  Worsening over the course of the last several weeks.  She is taking oxycodone, amitriptyline, Flexeril, Neurontin.  States that the pain is severe, exacerbated by movement, position.  She is pending referral to orthopedist.  She called her rheumatologist this morning but the office took an hour to call her back so she came to the ED.  She denies fever, chills, chest pain, abdominal pain  Past Medical History:  Diagnosis Date  . Asthma   . Diabetes mellitus    type 2  . Diabetic neuropathy ([place])   . Endometrial polyp   . Fibroadenoma of breast    left  . GERD (gastroesophageal reflux disease)   . Headache    migraines  . Hepatic steatosis   . Hyperlipidemia   . Hypertension   . Impingement syndrome of right shoulder   . Nontraumatic tear of right rotator cuff   . Pneumonia   . Psoriasis   . Rheumatoid arthritis(714.0)    rhematoid and osteoarthritis  . Sleep apnea   . Thyroid disease    had radiation  . Unspecified deficiency anemia    Thalassemia    Patient Active Problem List   Diagnosis Date Noted  . Elevated C-reactive protein (CRP)   . Elevated erythrocyte sedimentation rate   . Leukocytosis   . Hip joint effusion 01/20/2016  . Right hip pain 01/19/2016  . Hip  pain 01/19/2016  . Congestive heart failure (CHF) ([place]) 12/01/2015  . Anemia 12/01/2015  . CHF (congestive heart failure) ([place]) 12/01/2015  . Acute diastolic CHF (congestive heart failure) ([place])   . Right rotator cuff tear 05/20/2014  . Nontraumatic tear of right rotator cuff   . Impingement syndrome of right shoulder   . Thalassemia 02/28/2014  . On prednisone therapy 02/28/2014  . Rheumatoid arthritis ([place]) 02/28/2014  . Morbid obesity ([place]) 02/28/2014  . Shortness of breath 02/28/2014  . Renal insufficiency 02/28/2014  . Edema 02/28/2014  . Back pain 02/28/2014  . Essential hypertension 02/28/2014  . Diabetes ([place]) 02/28/2014  . Dyslipidemia 02/28/2014  . Obstructive sleep apnea 02/28/2014  . Acute on chronic diastolic congestive heart failure, NYHA class 2 ([place]) 02/28/2014  . Chest pain 02/27/2014  . Iron deficiency anemia 02/16/2014  . Post-menopausal bleeding 08/28/2012    Past Surgical History:  Procedure Laterality Date  . ANKLE ARTHROSCOPY Right   . CESAREAN SECTION    . COLONOSCOPY    . DILATION AND CURETTAGE OF UTERUS    . EYE SURGERY     surgery on retina  . HAND SURGERY     for arthritis  . left knee surgery nodule removal    . OTHER SURGICAL HISTORY     multiple nodule removal on neck, hands, left knee  . SHOULDER  ARTHROSCOPY WITH ROTATOR CUFF REPAIR AND SUBACROMIAL DECOMPRESSION Right 05/20/2014   Procedure: RIGHT SHOULDER ARTHROSCOPY WITH DEBRIDEMENT/DISTAL CLAVICLE EXCISION/ROTATOR CUFF REPAIR AND SUBACROMIAL DECOMPRESSION;  Surgeon: Lorn Junes, MD;  Location: [facility];  Service: Orthopedics;  Laterality: Right;  . TONSILLECTOMY    . TUBAL LIGATION     bilateral     OB History    Gravida  3   Para  2   Term  2   Preterm  0   AB  1   Living  2     SAB  1   TAB  0   Ectopic  0   Multiple  0   Live Births               Home Medications    Prior to Admission medications   Medication Sig Start Date End Date Taking?  Authorizing Provider  abatacept (ORENCIA) 250 MG injection Inject 1,000 mg into the vein every 28 (twenty-eight) days. Monthly Infusion    [provider]  albuterol (PROVENTIL HFA;VENTOLIN HFA) 108 (90 Base) MCG/ACT inhaler Inhale 2 puffs into the lungs every 6 (six) hours as needed for wheezing or shortness of breath.    [provider]  amitriptyline (ELAVIL) 25 MG tablet Take 25 mg by mouth at bedtime.     [provider]  ARTIFICIAL TEAR OP Place 1 drop into both eyes as needed (for dry eyes).    [provider]  BYSTOLIC 10 MG tablet Take 20 mg by mouth daily.  10/07/16   [provider]  chlorthalidone (HYGROTON) 25 MG tablet Take 25 mg by mouth daily.    [provider]  CRESTOR 20 MG tablet Take 20 mg by mouth daily.  03/14/14   [provider]  cyclobenzaprine (FLEXERIL) 10 MG tablet Take 10 mg by mouth 3 (three) times daily as needed.  11/20/16   [provider]  diltiazem (CARDIZEM CD) 240 MG 24 hr capsule Take 240 mg by mouth daily.  02/03/14   [provider]  fluticasone (FLONASE) 50 MCG/ACT nasal spray Place 1 spray into both nostrils daily as needed for allergies or rhinitis.    [provider]  gabapentin (NEURONTIN) 300 MG capsule Take 600 mg by mouth 3 (three) times daily.     [provider]  HYDROcodone-acetaminophen (NORCO/VICODIN) 5-325 MG tablet Take 1 tablet by mouth every 6 (six) hours as needed for moderate pain.    [provider]  insulin aspart (NOVOLOG) 100 UNIT/ML injection Inject 55-60 Units into the skin 3 (three) times daily before meals. 55 base Plus sliding scale    [provider]  insulin NPH (HUMULIN N,NOVOLIN N) 100 UNIT/ML injection Inject 75 Units into the skin 3 (three) times daily with meals. 75 units with breakfast; lunch 75 units; 75 units at dinner    [provider]  irbesartan (AVAPRO) 300 MG tablet Take 300 mg by mouth daily.      [provider]  ISOtretinoin (ACCUTANE) 40 MG capsule Take 40 mg by mouth daily.    [provider]  leflunomide (ARAVA) 20 MG tablet Take 20 mg by mouth daily.    [provider]  lidocaine (LIDODERM) 5 % Place 1 patch onto the skin daily. Remove & Discard patch within 12 hours or as directed by MD 08/27/17   Petrucelli, Aldona Bar R, PA-C  magnesium oxide (MAG-OX) 400 MG tablet Take 400 mg by mouth daily.    [provider]  meclizine (ANTIVERT) 25 MG tablet Take 25 mg by mouth 3 (three) times daily as needed for dizziness.    [provider]  metFORMIN (GLUCOPHAGE-XR) 500 MG 24 hr tablet Take 500 mg by mouth every evening.    [provider]  methocarbamol (ROBAXIN) 500 MG tablet Take 2 tablets (1,000 mg total) by mouth 4 (four) times daily as needed (Pain). 09/04/17   Zoriah Pulice, Elmyra Ricks, PA-C  naproxen (NAPROSYN) 500 MG tablet Take 1 tablet (500 mg total) by mouth 2 (two) times daily. 08/27/17   Petrucelli, Samantha R, PA-C  pantoprazole (PROTONIX) 40 MG tablet Take 40 mg by mouth daily.    [provider]  tiZANidine (ZANAFLEX) 4 MG tablet Take 4 mg by mouth 2 (two) times daily as needed (migraines). Limit 2 days per week 01/12/14   [provider]  topiramate (TOPAMAX) 200 MG tablet Take 200 mg by mouth daily. 07/20/16   [provider]  TRULICITY 5.28 UX/3.2GM SOPN Inject 0.75 mg into the skin once a week. Patient takes on Wednesday. 08/29/15   [provider]  Vitamin D, Ergocalciferol, (DRISDOL) 50000 units CAPS capsule Take 50,000 Units by mouth every 7 (seven) days.    [provider]  VOLTAREN 1 % GEL Apply 1 application topically 3 (three) times daily as needed (for pain).  02/03/14   [provider]    Family History Family History  Problem Relation Age of Onset  . Diabetes Mother   . Cancer Mother        breast  . Aneurysm Father        brain  . Diabetes Sister   . Cancer Brother         bone marrow cancer    Social History Social History   Tobacco Use  . Smoking status: Former Smoker    Packs/day: 0.25    Years: 6.00    Pack years: 1.50    Types: Cigarettes    Last attempt to quit: 06/09/2013    Years since quitting: 4.2  . Smokeless tobacco: Never Used  Substance Use Topics  . Alcohol use: No  . Drug use: No     Allergies   Bactrim [sulfamethoxazole-trimethoprim]; Cefuroxime axetil; Cephalosporins; Iohexol; Lisinopril; Penicillins; and Sulfa antibiotics   Review of Systems Review of Systems  A complete review of systems was obtained and all systems are negative except as noted in the HPI and PMH.   Physical Exam Updated Vital Signs BP 99/80   Pulse 88   Temp 98.1 F (36.7 C) (Oral)   Resp 16   Ht 5\' 6"  (1.676 m)   Wt 117.9 kg (260 lb)   LMP 12/27/2013 Comment: irreggular  SpO2 96%   BMI 41.97 kg/m   Physical Exam  Constitutional: She is oriented to person, place, and time. She appears well-developed and well-nourished. No distress.  HENT:  Head: Normocephalic and atraumatic.  Mouth/Throat: Oropharynx is clear and moist.  Eyes: Pupils are equal, round, and reactive to light. Conjunctivae and EOM are normal.  Neck: Normal range of motion.  Cardiovascular: Normal rate, regular rhythm and intact distal pulses.  Pulmonary/Chest: Effort normal and breath sounds normal.  Abdominal: Soft. There is no tenderness.  Musculoskeletal: Normal range of motion.  Edema to right great toe with no significant warmth diffusely tender to palpation, prior fracture to the toe several months ago per patient.  Neurological: She is alert and oriented to person, place, and time.  No point tenderness to percussion of lumbar  spinal processes.  No TTP or paraspinal muscular spasm. Strength is 5 out of 5 to bilateral lower extremities at hip and knee; extensor hallucis longus 5 out of 5. Ankle strength 5 out of 5, no clonus, neurovascularly intact. No saddle  anaesthesia. Patellar reflexes are 2+ bilaterally.     Skin: She is not diaphoretic.  Psychiatric: She has a normal mood and affect.  Nursing note and vitals reviewed.    ED Treatments / Results  Labs (all labs ordered are listed, but only abnormal results are displayed) Labs Reviewed - No data to display  EKG None  Radiology No results found.  Procedures Procedures (including critical care time)  Medications Ordered in ED Medications  ondansetron (ZOFRAN-ODT) disintegrating tablet 4 mg (4 mg Oral Given 09/04/17 1331)  HYDROmorphone (DILAUDID) injection 1 mg (1 mg Intramuscular Given 09/04/17 1653)     Initial Impression / Assessment and Plan / ED Course  I have reviewed the triage vital signs and the nursing notes.  Pertinent labs & imaging results that were available during my care of the patient were reviewed by me and considered in my medical decision making (see chart for details).     Vitals:   09/04/17 1320 09/04/17 1326 09/04/17 1635 09/04/17 1645  BP: 105/81  101/63 99/80  Pulse: 84  88   Resp: 18  16   Temp: 98.1 F (36.7 C)     TempSrc: Oral     SpO2:   96%   Weight:  117.9 kg (260 lb)    Height:  5\' 6"  (1.676 m)      Medications  ondansetron (ZOFRAN-ODT) disintegrating tablet 4 mg (4 mg Oral Given 09/04/17 1331)  HYDROmorphone (DILAUDID) injection 1 mg (1 mg Intramuscular Given 09/04/17 1653)    Jessica Howard is 57 y.o. female presenting with multiple pain complaints.  Has history of rheumatoid arthritis.  She does have some joint swelling.  This is not appear to be septic joint she is got some low back pain.  States that pain is exacerbated by movement.  She has a nonfocal neurologic exam, states that sometimes she dribbles on herself because she cannot get to the bathroom quick enough because it hurts to move.  There is no true incontinence.  Patient is being aggressively treated at home.  I am happy to give this patient a shot of pain control in  the ED Oleh Genin change her Flexeril to Robaxin she will need to follow closely with PCP, rheumatologist and consider possible pain management evaluation.  Evaluation does not show pathology that would require ongoing emergent intervention or inpatient treatment. Pt is hemodynamically stable and mentating appropriately. Discussed findings and plan with patient/guardian, who agrees with care plan. All questions answered. Return precautions discussed and outpatient follow up given.      Final Clinical Impressions(s) / ED Diagnoses   Final diagnoses:  Other chronic pain    ED Discharge Orders        Ordered    methocarbamol (ROBAXIN) 500 MG tablet  4 times daily PRN     09/04/17 1649       Josemaria Brining, Charna Elizabeth 09/04/17 2031    Macarthur Critchley, MD 08/12/2017 2010

## 2017-09-04 NOTE — ED Notes (Signed)
Pt verbalized understanding of all d/c instructions, prescriptions, and f/u information. VSS. All belongings with patient at this time.  

## 2017-09-04 NOTE — ED Notes (Signed)
Provider at bedside. Stated will be vertical 3.

## 2017-09-04 NOTE — Discharge Instructions (Addendum)
Please follow with your primary care doctor in the next 2 days for a check-up. They must obtain records for further management.  ° °Do not hesitate to return to the Emergency Department for any new, worsening or concerning symptoms.  ° °

## 2017-09-05 ENCOUNTER — Inpatient Hospital Stay (HOSPITAL_COMMUNITY): Payer: Medicare Other

## 2017-09-05 ENCOUNTER — Other Ambulatory Visit: Payer: Self-pay

## 2017-09-05 ENCOUNTER — Inpatient Hospital Stay (HOSPITAL_COMMUNITY)
Admission: EM | Admit: 2017-09-05 | Discharge: 2017-10-08 | DRG: 853 | Disposition: E | Payer: Medicare Other | Attending: Internal Medicine | Admitting: Internal Medicine

## 2017-09-05 ENCOUNTER — Emergency Department (HOSPITAL_COMMUNITY): Payer: Medicare Other

## 2017-09-05 ENCOUNTER — Encounter (HOSPITAL_COMMUNITY): Payer: Self-pay | Admitting: Emergency Medicine

## 2017-09-05 DIAGNOSIS — Z882 Allergy status to sulfonamides status: Secondary | ICD-10-CM | POA: Diagnosis not present

## 2017-09-05 DIAGNOSIS — E119 Type 2 diabetes mellitus without complications: Secondary | ICD-10-CM

## 2017-09-05 DIAGNOSIS — E1152 Type 2 diabetes mellitus with diabetic peripheral angiopathy with gangrene: Secondary | ICD-10-CM | POA: Diagnosis present

## 2017-09-05 DIAGNOSIS — M00031 Staphylococcal arthritis, right wrist: Secondary | ICD-10-CM | POA: Diagnosis not present

## 2017-09-05 DIAGNOSIS — Z881 Allergy status to other antibiotic agents status: Secondary | ICD-10-CM

## 2017-09-05 DIAGNOSIS — L02212 Cutaneous abscess of back [any part, except buttock]: Secondary | ICD-10-CM | POA: Diagnosis not present

## 2017-09-05 DIAGNOSIS — E871 Hypo-osmolality and hyponatremia: Secondary | ICD-10-CM | POA: Diagnosis present

## 2017-09-05 DIAGNOSIS — M71122 Other infective bursitis, left elbow: Secondary | ICD-10-CM

## 2017-09-05 DIAGNOSIS — A419 Sepsis, unspecified organism: Secondary | ICD-10-CM | POA: Diagnosis not present

## 2017-09-05 DIAGNOSIS — D242 Benign neoplasm of left breast: Secondary | ICD-10-CM | POA: Diagnosis present

## 2017-09-05 DIAGNOSIS — G9340 Encephalopathy, unspecified: Secondary | ICD-10-CM | POA: Diagnosis not present

## 2017-09-05 DIAGNOSIS — M79642 Pain in left hand: Secondary | ICD-10-CM | POA: Diagnosis not present

## 2017-09-05 DIAGNOSIS — M869 Osteomyelitis, unspecified: Secondary | ICD-10-CM | POA: Diagnosis present

## 2017-09-05 DIAGNOSIS — J9600 Acute respiratory failure, unspecified whether with hypoxia or hypercapnia: Secondary | ICD-10-CM

## 2017-09-05 DIAGNOSIS — M109 Gout, unspecified: Secondary | ICD-10-CM | POA: Diagnosis present

## 2017-09-05 DIAGNOSIS — M7989 Other specified soft tissue disorders: Secondary | ICD-10-CM | POA: Diagnosis not present

## 2017-09-05 DIAGNOSIS — R4182 Altered mental status, unspecified: Secondary | ICD-10-CM | POA: Diagnosis not present

## 2017-09-05 DIAGNOSIS — E872 Acidosis: Secondary | ICD-10-CM | POA: Diagnosis present

## 2017-09-05 DIAGNOSIS — M79A12 Nontraumatic compartment syndrome of left upper extremity: Secondary | ICD-10-CM | POA: Diagnosis not present

## 2017-09-05 DIAGNOSIS — N390 Urinary tract infection, site not specified: Secondary | ICD-10-CM | POA: Diagnosis present

## 2017-09-05 DIAGNOSIS — L02415 Cutaneous abscess of right lower limb: Secondary | ICD-10-CM | POA: Diagnosis not present

## 2017-09-05 DIAGNOSIS — M5127 Other intervertebral disc displacement, lumbosacral region: Secondary | ICD-10-CM | POA: Diagnosis not present

## 2017-09-05 DIAGNOSIS — L089 Local infection of the skin and subcutaneous tissue, unspecified: Secondary | ICD-10-CM | POA: Diagnosis not present

## 2017-09-05 DIAGNOSIS — L02414 Cutaneous abscess of left upper limb: Secondary | ICD-10-CM | POA: Diagnosis not present

## 2017-09-05 DIAGNOSIS — E1122 Type 2 diabetes mellitus with diabetic chronic kidney disease: Secondary | ICD-10-CM | POA: Diagnosis present

## 2017-09-05 DIAGNOSIS — Z6841 Body Mass Index (BMI) 40.0 and over, adult: Secondary | ICD-10-CM | POA: Diagnosis not present

## 2017-09-05 DIAGNOSIS — I5043 Acute on chronic combined systolic (congestive) and diastolic (congestive) heart failure: Secondary | ICD-10-CM | POA: Diagnosis present

## 2017-09-05 DIAGNOSIS — R600 Localized edema: Secondary | ICD-10-CM | POA: Diagnosis not present

## 2017-09-05 DIAGNOSIS — M069 Rheumatoid arthritis, unspecified: Secondary | ICD-10-CM | POA: Diagnosis not present

## 2017-09-05 DIAGNOSIS — M549 Dorsalgia, unspecified: Secondary | ICD-10-CM | POA: Diagnosis present

## 2017-09-05 DIAGNOSIS — D509 Iron deficiency anemia, unspecified: Secondary | ICD-10-CM | POA: Diagnosis not present

## 2017-09-05 DIAGNOSIS — D561 Beta thalassemia: Secondary | ICD-10-CM | POA: Diagnosis not present

## 2017-09-05 DIAGNOSIS — M009 Pyogenic arthritis, unspecified: Secondary | ICD-10-CM | POA: Diagnosis present

## 2017-09-05 DIAGNOSIS — I330 Acute and subacute infective endocarditis: Secondary | ICD-10-CM | POA: Diagnosis not present

## 2017-09-05 DIAGNOSIS — I483 Typical atrial flutter: Secondary | ICD-10-CM | POA: Diagnosis not present

## 2017-09-05 DIAGNOSIS — IMO0001 Reserved for inherently not codable concepts without codable children: Secondary | ICD-10-CM

## 2017-09-05 DIAGNOSIS — I059 Rheumatic mitral valve disease, unspecified: Secondary | ICD-10-CM | POA: Diagnosis not present

## 2017-09-05 DIAGNOSIS — G4733 Obstructive sleep apnea (adult) (pediatric): Secondary | ICD-10-CM | POA: Diagnosis present

## 2017-09-05 DIAGNOSIS — I071 Rheumatic tricuspid insufficiency: Secondary | ICD-10-CM | POA: Diagnosis present

## 2017-09-05 DIAGNOSIS — Z515 Encounter for palliative care: Secondary | ICD-10-CM | POA: Diagnosis not present

## 2017-09-05 DIAGNOSIS — Z888 Allergy status to other drugs, medicaments and biological substances status: Secondary | ICD-10-CM

## 2017-09-05 DIAGNOSIS — I10 Essential (primary) hypertension: Secondary | ICD-10-CM | POA: Diagnosis present

## 2017-09-05 DIAGNOSIS — N19 Unspecified kidney failure: Secondary | ICD-10-CM

## 2017-09-05 DIAGNOSIS — Z4682 Encounter for fitting and adjustment of non-vascular catheter: Secondary | ICD-10-CM | POA: Diagnosis not present

## 2017-09-05 DIAGNOSIS — J811 Chronic pulmonary edema: Secondary | ICD-10-CM | POA: Diagnosis not present

## 2017-09-05 DIAGNOSIS — E1169 Type 2 diabetes mellitus with other specified complication: Secondary | ICD-10-CM | POA: Diagnosis present

## 2017-09-05 DIAGNOSIS — M71072 Abscess of bursa, left ankle and foot: Secondary | ICD-10-CM | POA: Diagnosis not present

## 2017-09-05 DIAGNOSIS — M65132 Other infective (teno)synovitis, left wrist: Secondary | ICD-10-CM | POA: Diagnosis not present

## 2017-09-05 DIAGNOSIS — R4020 Unspecified coma: Secondary | ICD-10-CM | POA: Diagnosis not present

## 2017-09-05 DIAGNOSIS — Z794 Long term (current) use of insulin: Secondary | ICD-10-CM

## 2017-09-05 DIAGNOSIS — I38 Endocarditis, valve unspecified: Secondary | ICD-10-CM | POA: Diagnosis not present

## 2017-09-05 DIAGNOSIS — Z89212 Acquired absence of left upper limb below elbow: Secondary | ICD-10-CM | POA: Diagnosis not present

## 2017-09-05 DIAGNOSIS — M25532 Pain in left wrist: Secondary | ICD-10-CM

## 2017-09-05 DIAGNOSIS — M71071 Abscess of bursa, right ankle and foot: Secondary | ICD-10-CM | POA: Diagnosis not present

## 2017-09-05 DIAGNOSIS — M79676 Pain in unspecified toe(s): Secondary | ICD-10-CM | POA: Diagnosis not present

## 2017-09-05 DIAGNOSIS — M1990 Unspecified osteoarthritis, unspecified site: Secondary | ICD-10-CM | POA: Diagnosis present

## 2017-09-05 DIAGNOSIS — I959 Hypotension, unspecified: Secondary | ICD-10-CM | POA: Diagnosis not present

## 2017-09-05 DIAGNOSIS — Z9911 Dependence on respirator [ventilator] status: Secondary | ICD-10-CM | POA: Diagnosis not present

## 2017-09-05 DIAGNOSIS — Z880 Allergy status to penicillin: Secondary | ICD-10-CM

## 2017-09-05 DIAGNOSIS — G061 Intraspinal abscess and granuloma: Secondary | ICD-10-CM | POA: Diagnosis not present

## 2017-09-05 DIAGNOSIS — Z89511 Acquired absence of right leg below knee: Secondary | ICD-10-CM

## 2017-09-05 DIAGNOSIS — L03114 Cellulitis of left upper limb: Secondary | ICD-10-CM | POA: Diagnosis present

## 2017-09-05 DIAGNOSIS — M71021 Abscess of bursa, right elbow: Secondary | ICD-10-CM | POA: Diagnosis not present

## 2017-09-05 DIAGNOSIS — R740 Nonspecific elevation of levels of transaminase and lactic acid dehydrogenase [LDH]: Secondary | ICD-10-CM

## 2017-09-05 DIAGNOSIS — D62 Acute posthemorrhagic anemia: Secondary | ICD-10-CM | POA: Diagnosis present

## 2017-09-05 DIAGNOSIS — K7200 Acute and subacute hepatic failure without coma: Secondary | ICD-10-CM | POA: Diagnosis not present

## 2017-09-05 DIAGNOSIS — Z7189 Other specified counseling: Secondary | ICD-10-CM | POA: Diagnosis not present

## 2017-09-05 DIAGNOSIS — B9561 Methicillin susceptible Staphylococcus aureus infection as the cause of diseases classified elsewhere: Secondary | ICD-10-CM | POA: Diagnosis not present

## 2017-09-05 DIAGNOSIS — R410 Disorientation, unspecified: Secondary | ICD-10-CM | POA: Diagnosis not present

## 2017-09-05 DIAGNOSIS — E1140 Type 2 diabetes mellitus with diabetic neuropathy, unspecified: Secondary | ICD-10-CM | POA: Diagnosis present

## 2017-09-05 DIAGNOSIS — Z807 Family history of other malignant neoplasms of lymphoid, hematopoietic and related tissues: Secondary | ICD-10-CM

## 2017-09-05 DIAGNOSIS — I110 Hypertensive heart disease with heart failure: Secondary | ICD-10-CM | POA: Diagnosis not present

## 2017-09-05 DIAGNOSIS — M868X3 Other osteomyelitis, forearm: Secondary | ICD-10-CM | POA: Diagnosis not present

## 2017-09-05 DIAGNOSIS — A4101 Sepsis due to Methicillin susceptible Staphylococcus aureus: Principal | ICD-10-CM | POA: Diagnosis present

## 2017-09-05 DIAGNOSIS — D84821 Immunodeficiency due to drugs: Secondary | ICD-10-CM

## 2017-09-05 DIAGNOSIS — N179 Acute kidney failure, unspecified: Secondary | ICD-10-CM | POA: Diagnosis not present

## 2017-09-05 DIAGNOSIS — J90 Pleural effusion, not elsewhere classified: Secondary | ICD-10-CM | POA: Diagnosis not present

## 2017-09-05 DIAGNOSIS — E785 Hyperlipidemia, unspecified: Secondary | ICD-10-CM | POA: Diagnosis present

## 2017-09-05 DIAGNOSIS — H9122 Sudden idiopathic hearing loss, left ear: Secondary | ICD-10-CM | POA: Diagnosis present

## 2017-09-05 DIAGNOSIS — R52 Pain, unspecified: Secondary | ICD-10-CM | POA: Diagnosis not present

## 2017-09-05 DIAGNOSIS — I742 Embolism and thrombosis of arteries of the upper extremities: Secondary | ICD-10-CM | POA: Diagnosis not present

## 2017-09-05 DIAGNOSIS — M7022 Olecranon bursitis, left elbow: Secondary | ICD-10-CM | POA: Diagnosis not present

## 2017-09-05 DIAGNOSIS — D649 Anemia, unspecified: Secondary | ICD-10-CM | POA: Diagnosis not present

## 2017-09-05 DIAGNOSIS — Z79899 Other long term (current) drug therapy: Secondary | ICD-10-CM | POA: Diagnosis not present

## 2017-09-05 DIAGNOSIS — I272 Pulmonary hypertension, unspecified: Secondary | ICD-10-CM | POA: Diagnosis present

## 2017-09-05 DIAGNOSIS — R6521 Severe sepsis with septic shock: Secondary | ICD-10-CM | POA: Diagnosis present

## 2017-09-05 DIAGNOSIS — H9123 Sudden idiopathic hearing loss, bilateral: Secondary | ICD-10-CM | POA: Diagnosis not present

## 2017-09-05 DIAGNOSIS — L814 Other melanin hyperpigmentation: Secondary | ICD-10-CM | POA: Diagnosis not present

## 2017-09-05 DIAGNOSIS — M545 Low back pain: Secondary | ICD-10-CM | POA: Diagnosis not present

## 2017-09-05 DIAGNOSIS — R7881 Bacteremia: Secondary | ICD-10-CM | POA: Diagnosis not present

## 2017-09-05 DIAGNOSIS — I4892 Unspecified atrial flutter: Secondary | ICD-10-CM | POA: Diagnosis not present

## 2017-09-05 DIAGNOSIS — S648X2A Injury of other nerves at wrist and hand level of left arm, initial encounter: Secondary | ICD-10-CM | POA: Diagnosis not present

## 2017-09-05 DIAGNOSIS — Z5181 Encounter for therapeutic drug level monitoring: Secondary | ICD-10-CM | POA: Diagnosis not present

## 2017-09-05 DIAGNOSIS — M86032 Acute hematogenous osteomyelitis, left radius and ulna: Secondary | ICD-10-CM | POA: Diagnosis not present

## 2017-09-05 DIAGNOSIS — Z978 Presence of other specified devices: Secondary | ICD-10-CM

## 2017-09-05 DIAGNOSIS — M00032 Staphylococcal arthritis, left wrist: Secondary | ICD-10-CM | POA: Diagnosis not present

## 2017-09-05 DIAGNOSIS — Z87891 Personal history of nicotine dependence: Secondary | ICD-10-CM | POA: Diagnosis not present

## 2017-09-05 DIAGNOSIS — K5790 Diverticulosis of intestine, part unspecified, without perforation or abscess without bleeding: Secondary | ICD-10-CM | POA: Diagnosis not present

## 2017-09-05 DIAGNOSIS — L409 Psoriasis, unspecified: Secondary | ICD-10-CM | POA: Diagnosis present

## 2017-09-05 DIAGNOSIS — Z452 Encounter for adjustment and management of vascular access device: Secondary | ICD-10-CM | POA: Diagnosis not present

## 2017-09-05 DIAGNOSIS — B999 Unspecified infectious disease: Secondary | ICD-10-CM

## 2017-09-05 DIAGNOSIS — N170 Acute kidney failure with tubular necrosis: Secondary | ICD-10-CM | POA: Diagnosis not present

## 2017-09-05 DIAGNOSIS — L0291 Cutaneous abscess, unspecified: Secondary | ICD-10-CM | POA: Diagnosis not present

## 2017-09-05 DIAGNOSIS — I129 Hypertensive chronic kidney disease with stage 1 through stage 4 chronic kidney disease, or unspecified chronic kidney disease: Secondary | ICD-10-CM | POA: Diagnosis present

## 2017-09-05 DIAGNOSIS — G894 Chronic pain syndrome: Secondary | ICD-10-CM | POA: Diagnosis present

## 2017-09-05 DIAGNOSIS — R944 Abnormal results of kidney function studies: Secondary | ICD-10-CM | POA: Diagnosis not present

## 2017-09-05 DIAGNOSIS — D569 Thalassemia, unspecified: Secondary | ICD-10-CM | POA: Diagnosis present

## 2017-09-05 DIAGNOSIS — Z66 Do not resuscitate: Secondary | ICD-10-CM | POA: Diagnosis not present

## 2017-09-05 DIAGNOSIS — M79671 Pain in right foot: Secondary | ICD-10-CM | POA: Diagnosis not present

## 2017-09-05 DIAGNOSIS — I96 Gangrene, not elsewhere classified: Secondary | ICD-10-CM | POA: Diagnosis not present

## 2017-09-05 DIAGNOSIS — M7021 Olecranon bursitis, right elbow: Secondary | ICD-10-CM | POA: Diagnosis not present

## 2017-09-05 DIAGNOSIS — Z803 Family history of malignant neoplasm of breast: Secondary | ICD-10-CM

## 2017-09-05 DIAGNOSIS — E860 Dehydration: Secondary | ICD-10-CM | POA: Diagnosis present

## 2017-09-05 DIAGNOSIS — D631 Anemia in chronic kidney disease: Secondary | ICD-10-CM | POA: Diagnosis present

## 2017-09-05 DIAGNOSIS — R319 Hematuria, unspecified: Secondary | ICD-10-CM | POA: Diagnosis not present

## 2017-09-05 DIAGNOSIS — R6520 Severe sepsis without septic shock: Secondary | ICD-10-CM | POA: Diagnosis not present

## 2017-09-05 DIAGNOSIS — B9689 Other specified bacterial agents as the cause of diseases classified elsewhere: Secondary | ICD-10-CM | POA: Diagnosis not present

## 2017-09-05 DIAGNOSIS — I5033 Acute on chronic diastolic (congestive) heart failure: Secondary | ICD-10-CM | POA: Diagnosis not present

## 2017-09-05 DIAGNOSIS — R609 Edema, unspecified: Secondary | ICD-10-CM | POA: Diagnosis not present

## 2017-09-05 DIAGNOSIS — R440 Auditory hallucinations: Secondary | ICD-10-CM | POA: Diagnosis not present

## 2017-09-05 DIAGNOSIS — L608 Other nail disorders: Secondary | ICD-10-CM | POA: Diagnosis not present

## 2017-09-05 DIAGNOSIS — M47817 Spondylosis without myelopathy or radiculopathy, lumbosacral region: Secondary | ICD-10-CM | POA: Diagnosis not present

## 2017-09-05 DIAGNOSIS — Z95828 Presence of other vascular implants and grafts: Secondary | ICD-10-CM | POA: Diagnosis not present

## 2017-09-05 DIAGNOSIS — S61001A Unspecified open wound of right thumb without damage to nail, initial encounter: Secondary | ICD-10-CM | POA: Diagnosis not present

## 2017-09-05 DIAGNOSIS — M86142 Other acute osteomyelitis, left hand: Secondary | ICD-10-CM | POA: Diagnosis not present

## 2017-09-05 DIAGNOSIS — R7401 Elevation of levels of liver transaminase levels: Secondary | ICD-10-CM

## 2017-09-05 DIAGNOSIS — Z7951 Long term (current) use of inhaled steroids: Secondary | ICD-10-CM

## 2017-09-05 DIAGNOSIS — M71572 Other bursitis, not elsewhere classified, left ankle and foot: Secondary | ICD-10-CM | POA: Diagnosis not present

## 2017-09-05 DIAGNOSIS — H9192 Unspecified hearing loss, left ear: Secondary | ICD-10-CM | POA: Diagnosis present

## 2017-09-05 DIAGNOSIS — H9191 Unspecified hearing loss, right ear: Secondary | ICD-10-CM | POA: Diagnosis present

## 2017-09-05 DIAGNOSIS — M79672 Pain in left foot: Secondary | ICD-10-CM | POA: Diagnosis not present

## 2017-09-05 DIAGNOSIS — L02413 Cutaneous abscess of right upper limb: Secondary | ICD-10-CM | POA: Diagnosis not present

## 2017-09-05 DIAGNOSIS — M79603 Pain in arm, unspecified: Secondary | ICD-10-CM | POA: Diagnosis not present

## 2017-09-05 DIAGNOSIS — E875 Hyperkalemia: Secondary | ICD-10-CM | POA: Diagnosis present

## 2017-09-05 DIAGNOSIS — I480 Paroxysmal atrial fibrillation: Secondary | ICD-10-CM | POA: Diagnosis not present

## 2017-09-05 DIAGNOSIS — J9690 Respiratory failure, unspecified, unspecified whether with hypoxia or hypercapnia: Secondary | ICD-10-CM | POA: Diagnosis not present

## 2017-09-05 DIAGNOSIS — Z833 Family history of diabetes mellitus: Secondary | ICD-10-CM

## 2017-09-05 DIAGNOSIS — R0989 Other specified symptoms and signs involving the circulatory and respiratory systems: Secondary | ICD-10-CM | POA: Diagnosis not present

## 2017-09-05 DIAGNOSIS — R0602 Shortness of breath: Secondary | ICD-10-CM | POA: Diagnosis not present

## 2017-09-05 DIAGNOSIS — R238 Other skin changes: Secondary | ICD-10-CM | POA: Diagnosis not present

## 2017-09-05 DIAGNOSIS — G5622 Lesion of ulnar nerve, left upper limb: Secondary | ICD-10-CM | POA: Diagnosis not present

## 2017-09-05 DIAGNOSIS — R7989 Other specified abnormal findings of blood chemistry: Secondary | ICD-10-CM | POA: Diagnosis not present

## 2017-09-05 DIAGNOSIS — L02611 Cutaneous abscess of right foot: Secondary | ICD-10-CM | POA: Diagnosis present

## 2017-09-05 DIAGNOSIS — I361 Nonrheumatic tricuspid (valve) insufficiency: Secondary | ICD-10-CM | POA: Diagnosis not present

## 2017-09-05 DIAGNOSIS — K219 Gastro-esophageal reflux disease without esophagitis: Secondary | ICD-10-CM | POA: Diagnosis present

## 2017-09-05 DIAGNOSIS — R748 Abnormal levels of other serum enzymes: Secondary | ICD-10-CM | POA: Diagnosis present

## 2017-09-05 DIAGNOSIS — J45909 Unspecified asthma, uncomplicated: Secondary | ICD-10-CM | POA: Diagnosis present

## 2017-09-05 DIAGNOSIS — M25539 Pain in unspecified wrist: Secondary | ICD-10-CM | POA: Diagnosis not present

## 2017-09-05 DIAGNOSIS — N189 Chronic kidney disease, unspecified: Secondary | ICD-10-CM | POA: Diagnosis present

## 2017-09-05 DIAGNOSIS — M71121 Other infective bursitis, right elbow: Secondary | ICD-10-CM

## 2017-09-05 DIAGNOSIS — L0889 Other specified local infections of the skin and subcutaneous tissue: Secondary | ICD-10-CM | POA: Diagnosis not present

## 2017-09-05 DIAGNOSIS — M4656 Other infective spondylopathies, lumbar region: Secondary | ICD-10-CM | POA: Diagnosis not present

## 2017-09-05 DIAGNOSIS — K760 Fatty (change of) liver, not elsewhere classified: Secondary | ICD-10-CM | POA: Diagnosis present

## 2017-09-05 DIAGNOSIS — S88111A Complete traumatic amputation at level between knee and ankle, right lower leg, initial encounter: Secondary | ICD-10-CM | POA: Diagnosis not present

## 2017-09-05 DIAGNOSIS — R1915 Other abnormal bowel sounds: Secondary | ICD-10-CM | POA: Diagnosis not present

## 2017-09-05 DIAGNOSIS — L02416 Cutaneous abscess of left lower limb: Secondary | ICD-10-CM | POA: Diagnosis not present

## 2017-09-05 DIAGNOSIS — E861 Hypovolemia: Secondary | ICD-10-CM | POA: Diagnosis present

## 2017-09-05 DIAGNOSIS — R451 Restlessness and agitation: Secondary | ICD-10-CM | POA: Diagnosis not present

## 2017-09-05 DIAGNOSIS — R918 Other nonspecific abnormal finding of lung field: Secondary | ICD-10-CM | POA: Diagnosis not present

## 2017-09-05 DIAGNOSIS — M25432 Effusion, left wrist: Secondary | ICD-10-CM | POA: Diagnosis not present

## 2017-09-05 DIAGNOSIS — M47819 Spondylosis without myelopathy or radiculopathy, site unspecified: Secondary | ICD-10-CM

## 2017-09-05 HISTORY — DX: Other long term (current) drug therapy: Z79.899

## 2017-09-05 HISTORY — DX: Long term (current) use of insulin: Z79.4

## 2017-09-05 HISTORY — DX: Type 2 diabetes mellitus without complications: E11.9

## 2017-09-05 HISTORY — DX: Beta thalassemia: D56.1

## 2017-09-05 HISTORY — DX: Unspecified hearing loss, right ear: H91.91

## 2017-09-05 HISTORY — DX: Immunodeficiency due to drugs: D84.821

## 2017-09-05 LAB — FERRITIN: Ferritin: 4025 ng/mL — ABNORMAL HIGH (ref 11–307)

## 2017-09-05 LAB — COMPREHENSIVE METABOLIC PANEL
ALBUMIN: 2 g/dL — AB (ref 3.5–5.0)
ALK PHOS: 354 U/L — AB (ref 38–126)
ALT: 24 U/L (ref 14–54)
ALT: 34 U/L (ref 14–54)
AST: 53 U/L — ABNORMAL HIGH (ref 15–41)
AST: 87 U/L — ABNORMAL HIGH (ref 15–41)
Albumin: 2.1 g/dL — ABNORMAL LOW (ref 3.5–5.0)
Alkaline Phosphatase: 294 U/L — ABNORMAL HIGH (ref 38–126)
Anion gap: 17 — ABNORMAL HIGH (ref 5–15)
Anion gap: 16 — ABNORMAL HIGH (ref 5–15)
BILIRUBIN TOTAL: 4.1 mg/dL — AB (ref 0.3–1.2)
BILIRUBIN TOTAL: 6.2 mg/dL — AB (ref 0.3–1.2)
BUN: 69 mg/dL — ABNORMAL HIGH (ref 6–20)
BUN: 69 mg/dL — ABNORMAL HIGH (ref 6–20)
CALCIUM: 7 mg/dL — AB (ref 8.9–10.3)
CHLORIDE: 97 mmol/L — AB (ref 101–111)
CO2: 18 mmol/L — ABNORMAL LOW (ref 22–32)
CO2: 15 mmol/L — ABNORMAL LOW (ref 22–32)
CREATININE: 3.91 mg/dL — AB (ref 0.44–1.00)
Calcium: 7.6 mg/dL — ABNORMAL LOW (ref 8.9–10.3)
Chloride: 104 mmol/L (ref 101–111)
Creatinine, Ser: 3.36 mg/dL — ABNORMAL HIGH (ref 0.44–1.00)
GFR calc Af Amer: 17 mL/min — ABNORMAL LOW (ref >60)
GFR, EST AFRICAN AMERICAN: 14 mL/min — AB (ref >60)
GFR, EST NON AFRICAN AMERICAN: 14 mL/min — AB (ref >60)
GFR, EST NON AFRICAN AMERICAN: 12 mL/min — AB (ref >60)
GLUCOSE: 337 mg/dL — AB (ref 65–99)
Glucose, Bld: 275 mg/dL — ABNORMAL HIGH (ref 65–99)
POTASSIUM: 5.1 mmol/L (ref 3.5–5.1)
Potassium: 6 mmol/L — ABNORMAL HIGH (ref 3.5–5.1)
Sodium: 132 mmol/L — ABNORMAL LOW (ref 135–145)
Sodium: 135 mmol/L (ref 135–145)
TOTAL PROTEIN: 6.5 g/dL (ref 6.5–8.1)
TOTAL PROTEIN: 6.8 g/dL (ref 6.5–8.1)

## 2017-09-05 LAB — RAPID URINE DRUG SCREEN, HOSP PERFORMED
Amphetamines: NOT DETECTED
BARBITURATES: NOT DETECTED
BENZODIAZEPINES: NOT DETECTED
COCAINE: NOT DETECTED
OPIATES: POSITIVE — AB
Tetrahydrocannabinol: NOT DETECTED

## 2017-09-05 LAB — PROCALCITONIN: PROCALCITONIN: 11.11 ng/mL

## 2017-09-05 LAB — I-STAT VENOUS BLOOD GAS, ED
Acid-base deficit: 5 mmol/L — ABNORMAL HIGH (ref 0.0–2.0)
Bicarbonate: 20.3 mmol/L (ref 20.0–28.0)
O2 SAT: 67 %
PCO2 VEN: 35.5 mmHg — AB (ref 44.0–60.0)
TCO2: 21 mmol/L — AB (ref 22–32)
pH, Ven: 7.366 (ref 7.250–7.430)
pO2, Ven: 35 mmHg (ref 32.0–45.0)

## 2017-09-05 LAB — URINALYSIS, ROUTINE W REFLEX MICROSCOPIC
GLUCOSE, UA: 50 mg/dL — AB
KETONES UR: NEGATIVE mg/dL
Nitrite: NEGATIVE
PROTEIN: 30 mg/dL — AB
Specific Gravity, Urine: 1.023 (ref 1.005–1.030)
pH: 5 (ref 5.0–8.0)

## 2017-09-05 LAB — HEPATIC FUNCTION PANEL
ALT: 32 U/L (ref 14–54)
AST: 81 U/L — ABNORMAL HIGH (ref 15–41)
Albumin: 1.8 g/dL — ABNORMAL LOW (ref 3.5–5.0)
Alkaline Phosphatase: 335 U/L — ABNORMAL HIGH (ref 38–126)
BILIRUBIN DIRECT: 3.5 mg/dL — AB (ref 0.1–0.5)
BILIRUBIN INDIRECT: 1.9 mg/dL — AB (ref 0.3–0.9)
Total Bilirubin: 5.4 mg/dL — ABNORMAL HIGH (ref 0.3–1.2)
Total Protein: 6.4 g/dL — ABNORMAL LOW (ref 6.5–8.1)

## 2017-09-05 LAB — LIPASE, BLOOD: LIPASE: 28 U/L (ref 11–51)

## 2017-09-05 LAB — CBC WITH DIFFERENTIAL/PLATELET
BLASTS: 0 %
Band Neutrophils: 10 %
Basophils Absolute: 0 10*3/uL (ref 0.0–0.1)
Basophils Relative: 0 %
Eosinophils Absolute: 0 10*3/uL (ref 0.0–0.7)
Eosinophils Relative: 0 %
HEMATOCRIT: 23.3 % — AB (ref 36.0–46.0)
Hemoglobin: 7.6 g/dL — ABNORMAL LOW (ref 12.0–15.0)
LYMPHS ABS: 2 10*3/uL (ref 0.7–4.0)
Lymphocytes Relative: 8 %
MCH: 19.9 pg — AB (ref 26.0–34.0)
MCHC: 32.6 g/dL (ref 30.0–36.0)
MCV: 61 fL — AB (ref 78.0–100.0)
MONOS PCT: 4 %
Metamyelocytes Relative: 0 %
Monocytes Absolute: 1 10*3/uL (ref 0.1–1.0)
Myelocytes: 0 %
Neutro Abs: 22.1 10*3/uL — ABNORMAL HIGH (ref 1.7–7.7)
Neutrophils Relative %: 78 %
Platelets: 423 10*3/uL — ABNORMAL HIGH (ref 150–400)
Promyelocytes Absolute: 0 %
RBC: 3.82 MIL/uL — AB (ref 3.87–5.11)
RDW: 18.1 % — AB (ref 11.5–15.5)
WBC: 25.1 10*3/uL — AB (ref 4.0–10.5)
nRBC: 0 /100 WBC

## 2017-09-05 LAB — AMMONIA: AMMONIA: 38 umol/L — AB (ref 9–35)

## 2017-09-05 LAB — CBG MONITORING, ED
GLUCOSE-CAPILLARY: 277 mg/dL — AB (ref 65–99)
Glucose-Capillary: 313 mg/dL — ABNORMAL HIGH (ref 65–99)
Glucose-Capillary: 285 mg/dL — ABNORMAL HIGH (ref 65–99)

## 2017-09-05 LAB — BASIC METABOLIC PANEL
ANION GAP: 15 (ref 5–15)
BUN: 69 mg/dL — ABNORMAL HIGH (ref 6–20)
CHLORIDE: 103 mmol/L (ref 101–111)
CO2: 18 mmol/L — AB (ref 22–32)
Calcium: 7 mg/dL — ABNORMAL LOW (ref 8.9–10.3)
Creatinine, Ser: 3.75 mg/dL — ABNORMAL HIGH (ref 0.44–1.00)
GFR calc non Af Amer: 13 mL/min — ABNORMAL LOW (ref >60)
GFR, EST AFRICAN AMERICAN: 15 mL/min — AB (ref >60)
Glucose, Bld: 298 mg/dL — ABNORMAL HIGH (ref 65–99)
POTASSIUM: 5.6 mmol/L — AB (ref 3.5–5.1)
Sodium: 136 mmol/L (ref 135–145)

## 2017-09-05 LAB — PHOSPHORUS: PHOSPHORUS: 5.9 mg/dL — AB (ref 2.5–4.6)

## 2017-09-05 LAB — PREPARE RBC (CROSSMATCH)

## 2017-09-05 LAB — LACTATE DEHYDROGENASE: LDH: 382 U/L — AB (ref 98–192)

## 2017-09-05 LAB — LACTIC ACID, PLASMA
LACTIC ACID, VENOUS: 2.1 mmol/L — AB (ref 0.5–1.9)
Lactic Acid, Venous: 1.9 mmol/L (ref 0.5–1.9)

## 2017-09-05 LAB — SAVE SMEAR

## 2017-09-05 LAB — VITAMIN B12: VITAMIN B 12: 429 pg/mL (ref 180–914)

## 2017-09-05 LAB — I-STAT CG4 LACTIC ACID, ED
LACTIC ACID, VENOUS: 2.59 mmol/L — AB (ref 0.5–1.9)
Lactic Acid, Venous: 2.58 mmol/L (ref 0.5–1.9)

## 2017-09-05 LAB — RETICULOCYTES
RBC.: 3.8 MIL/uL — ABNORMAL LOW (ref 3.87–5.11)
RETIC CT PCT: 0.7 % (ref 0.4–3.1)
Retic Count, Absolute: 26.6 10*3/uL (ref 19.0–186.0)

## 2017-09-05 LAB — PROTIME-INR
INR: 1.45
PROTHROMBIN TIME: 17.5 s — AB (ref 11.4–15.2)

## 2017-09-05 LAB — IRON AND TIBC
Iron: 33 ug/dL (ref 28–170)
SATURATION RATIOS: 24 % (ref 10.4–31.8)
TIBC: 136 ug/dL — ABNORMAL LOW (ref 250–450)
UIBC: 103 ug/dL

## 2017-09-05 LAB — SEDIMENTATION RATE: SED RATE: 122 mm/h — AB (ref 0–22)

## 2017-09-05 LAB — APTT: APTT: 36 s (ref 24–36)

## 2017-09-05 LAB — POC OCCULT BLOOD, ED: FECAL OCCULT BLD: NEGATIVE

## 2017-09-05 LAB — TSH: TSH: 0.822 u[IU]/mL (ref 0.350–4.500)

## 2017-09-05 LAB — URIC ACID: Uric Acid, Serum: 10.3 mg/dL — ABNORMAL HIGH (ref 2.3–6.6)

## 2017-09-05 LAB — MAGNESIUM: Magnesium: 2.3 mg/dL (ref 1.7–2.4)

## 2017-09-05 LAB — FOLATE: Folate: 6 ng/mL (ref >5.9)

## 2017-09-05 LAB — CK: CK TOTAL: 55 U/L (ref 38–234)

## 2017-09-05 LAB — TECHNOLOGIST SMEAR REVIEW

## 2017-09-05 MED ORDER — ONDANSETRON HCL 4 MG PO TABS: 4.0000 mg | ORAL_TABLET | Freq: Four times a day (QID) | ORAL | Status: DC | PRN

## 2017-09-05 MED ORDER — SODIUM CHLORIDE 0.9 % IV SOLN
10.0000 mL/h | Freq: Once | INTRAVENOUS | Status: AC
  Administered 2017-09-05: 10 mL/h via INTRAVENOUS

## 2017-09-05 MED ORDER — SODIUM CHLORIDE 0.9 % IV BOLUS (SEPSIS)
1000.0000 mL | Freq: Once | INTRAVENOUS | Status: AC
  Administered 2017-09-05: 1000 mL via INTRAVENOUS

## 2017-09-05 MED ORDER — AZTREONAM 2 G IJ SOLR
2.0000 g | Freq: Three times a day (TID) | INTRAMUSCULAR | Status: DC
  Administered 2017-09-05 – 2017-09-06 (×3): 2 g via INTRAVENOUS
  Filled 2017-09-05 (×3): qty 2

## 2017-09-05 MED ORDER — SODIUM CHLORIDE 0.9 % IV SOLN
Freq: Once | INTRAVENOUS | Status: AC
  Administered 2017-09-05: 16:00:00 via INTRAVENOUS

## 2017-09-05 MED ORDER — MORPHINE SULFATE (PF) 4 MG/ML IV SOLN
1.0000 mg | INTRAVENOUS | Status: DC | PRN
  Administered 2017-09-07: 2 mg via INTRAVENOUS
  Administered 2017-09-08: 1 mg via INTRAVENOUS
  Administered 2017-09-08 – 2017-09-09 (×3): 2 mg via INTRAVENOUS
  Filled 2017-09-05 (×5): qty 1

## 2017-09-05 MED ORDER — ACETAMINOPHEN 325 MG PO TABS
650.0000 mg | ORAL_TABLET | Freq: Four times a day (QID) | ORAL | Status: DC | PRN
  Administered 2017-09-06 – 2017-09-20 (×6): 650 mg via ORAL
  Filled 2017-09-05 (×8): qty 2

## 2017-09-05 MED ORDER — LACTATED RINGERS IV BOLUS
1000.0000 mL | Freq: Once | INTRAVENOUS | Status: AC
  Administered 2017-09-05: 1000 mL via INTRAVENOUS

## 2017-09-05 MED ORDER — SODIUM CHLORIDE 0.9 % IV BOLUS (SEPSIS)
500.0000 mL | Freq: Once | INTRAVENOUS | Status: AC
  Administered 2017-09-05: 500 mL via INTRAVENOUS

## 2017-09-05 MED ORDER — METRONIDAZOLE IN NACL 5-0.79 MG/ML-% IV SOLN
500.0000 mg | Freq: Three times a day (TID) | INTRAVENOUS | Status: DC
  Administered 2017-09-05: 500 mg via INTRAVENOUS
  Filled 2017-09-05: qty 100

## 2017-09-05 MED ORDER — ONDANSETRON HCL 4 MG/2ML IJ SOLN: 4.0000 mg | Freq: Four times a day (QID) | INTRAMUSCULAR | Status: DC | PRN

## 2017-09-05 MED ORDER — ACETAMINOPHEN 650 MG RE SUPP
650.0000 mg | Freq: Four times a day (QID) | RECTAL | Status: DC | PRN
  Filled 2017-09-05: qty 1

## 2017-09-05 MED ORDER — INSULIN ASPART 100 UNIT/ML ~~LOC~~ SOLN
0.0000 [IU] | SUBCUTANEOUS | Status: DC
  Administered 2017-09-05 (×2): 8 [IU] via SUBCUTANEOUS
  Administered 2017-09-06 (×2): 5 [IU] via SUBCUTANEOUS
  Administered 2017-09-06: 2 [IU] via SUBCUTANEOUS
  Filled 2017-09-05 (×4): qty 1

## 2017-09-05 MED ORDER — CARBAMIDE PEROXIDE 6.5 % OT SOLN
10.0000 [drp] | Freq: Once | OTIC | Status: AC
  Administered 2017-09-06: 10 [drp] via OTIC
  Filled 2017-09-05: qty 15

## 2017-09-05 MED ORDER — HYDROMORPHONE HCL 1 MG/ML IJ SOLN
1.0000 mg | Freq: Once | INTRAMUSCULAR | Status: AC
  Administered 2017-09-05: 1 mg via INTRAVENOUS
  Filled 2017-09-05: qty 1

## 2017-09-05 MED ORDER — VANCOMYCIN HCL 10 G IV SOLR
2000.0000 mg | Freq: Once | INTRAVENOUS | Status: AC
  Administered 2017-09-05: 2000 mg via INTRAVENOUS
  Filled 2017-09-05: qty 2000

## 2017-09-05 MED ORDER — VANCOMYCIN HCL 10 G IV SOLR: 1500.0000 mg | INTRAVENOUS | Status: DC

## 2017-09-05 MED ORDER — ENOXAPARIN SODIUM 30 MG/0.3ML ~~LOC~~ SOLN
30.0000 mg | SUBCUTANEOUS | Status: DC
  Administered 2017-09-05 – 2017-09-09 (×4): 30 mg via SUBCUTANEOUS
  Filled 2017-09-05 (×5): qty 0.3

## 2017-09-05 MED ORDER — HYDROGEN PEROXIDE 3 % EX SOLN
Freq: Once | CUTANEOUS | Status: DC
  Filled 2017-09-05: qty 473

## 2017-09-05 MED ORDER — SODIUM CHLORIDE 0.9 % IV SOLN
INTRAVENOUS | Status: DC
  Administered 2017-09-05 – 2017-09-06 (×3): via INTRAVENOUS

## 2017-09-05 MED ORDER — VANCOMYCIN HCL IN DEXTROSE 1-5 GM/200ML-% IV SOLN: 1000.0000 mg | Freq: Once | INTRAVENOUS | Status: DC

## 2017-09-05 MED ORDER — SODIUM CHLORIDE 0.9% FLUSH
3.0000 mL | Freq: Two times a day (BID) | INTRAVENOUS | Status: DC
  Administered 2017-09-06 – 2017-09-22 (×24): 3 mL via INTRAVENOUS

## 2017-09-05 MED ORDER — PIPERACILLIN-TAZOBACTAM 3.375 G IVPB 30 MIN: 3.3750 g | Freq: Once | INTRAVENOUS | Status: DC

## 2017-09-05 MED ORDER — METRONIDAZOLE IN NACL 5-0.79 MG/ML-% IV SOLN
500.0000 mg | Freq: Once | INTRAVENOUS | Status: AC
  Administered 2017-09-05: 500 mg via INTRAVENOUS
  Filled 2017-09-05: qty 100

## 2017-09-05 MED ORDER — SODIUM CHLORIDE 0.9 % IV SOLN
1.0000 g | Freq: Once | INTRAVENOUS | Status: AC
  Administered 2017-09-05: 1 g via INTRAVENOUS
  Filled 2017-09-05: qty 10

## 2017-09-05 NOTE — Progress Notes (Signed)
Pharmacy Antibiotic Note  Jessica Howard is a 57 y.o. female admitted on 08/26/2017 with sepsis.  Pharmacy has been consulted for vancomycin dosing. Patient is having severe pain in toe/foot. EDP concerned for possible osteomyelitis. sCr 3.3 up from 0.8 in Sept 2018. One time doses of Aztreonam and Flagyl ordered.  Plan: Vancomycin 2000mg  once then every 1500mg  q48 hours.  Goal trough 15-20 mcg/mL. F/U renal function, clinical progression and LOT     Temp (24hrs), Avg:98.1 F (36.7 C), Min:98 F (36.7 C), Max:98.1 F (36.7 C)  Recent Labs  Lab 08/31/2017 0632 08/22/2017 0806  WBC 25.1*  --   CREATININE 3.36*  --   LATICACIDVEN  --  2.58*    Estimated Creatinine Clearance: 24.7 mL/min (A) (by C-G formula based on SCr of 3.36 mg/dL (H)).    Allergies  Allergen Reactions  . Bactrim [Sulfamethoxazole-Trimethoprim] Hives  . Cefuroxime Axetil Itching  . Cephalosporins Itching  . Iohexol Itching and Rash     Code: RASH, Desc: HAD ITCHING AND A RASH ABOUT ONE HOUR AFTER RETURNING HOME FROM THE CT, Onset Date: [id number]   . Lisinopril Cough  . Penicillins Hives  . Sulfa Antibiotics Hives    Thank you for allowing pharmacy to be a part of this patient's care.  Jodean Lima Ramesha Poster 08/24/2017 8:58 AM

## 2017-09-05 NOTE — ED Triage Notes (Signed)
Patient arrived with EMS from home reports chronic low back pain , bilateral arms pain and generalized body aches for several months , pain increases with movement/changing positions .

## 2017-09-05 NOTE — ED Provider Notes (Signed)
MSE was initiated and I personally evaluated the patient and placed orders (if any) at  6:45 AM on September 05, 2017.  The patient appears stable so that the remainder of the MSE may be completed by another provider.  Patient placed in Quick Look pathway, seen and evaluated   Chief Complaint: body aches, weakness, dehydration.  HPI:   57 y/o with complex pain hx. and several medical problems comes in with chief complaint of weakness, reduced p.o. Intake, leg swelling.  ROS: Confusion  Physical Exam:   Gen: No distress  Neuro: Awake, somnolent  Skin: Warm    Focused Exam: Patient is moving all 4 extremities. On exam she has lower extremity swelling.   Initiation of care has begun. The patient has been counseled on the process, plan, and necessity for staying for the completion/evaluation, and the remainder of the medical screening examination    Varney Biles, MD 09/09/17 2332

## 2017-09-05 NOTE — Consult Note (Signed)
Name: Jessica Howard MRN: [id number] DOB: 1960-10-12    ADMISSION DATE:  08/21/2017 CONSULTATION DATE:  08/16/2017  REFERRING MD :  Johnney Killian  CHIEF COMPLAINT:  Chronic pain, weakness   HISTORY OF PRESENT ILLNESS:  Jessica Howard is a 57 y.o. female with a PMH as outlined below.  She presented to [facility] ED 3/29 after she was too weak to walk to bathroom.  Daughter states that she attempted to help pt to bathroom but pt was so weak that she couldn't walk and gave out.  She therefore called EMS.  She was seen in ED 1 day prior for acute on chronic pain with finger tingling.  She was given a shot of pain meds and discharged home with instructions to follow up with PCP and rheumatologist.  Daughter states she went home and had continued pain and generalized weakness.  Over the past several days, she has also had decreased PO intake. Denies any fevers/chills/sweats, headache, chest pain, dyspnea, N/V/D.  Has had worsening of chronic pain to back and BUE's and BLE's.  In ED, she was found to have several metabolic derangements including Na 132, Cl 97, glucose 337, SCr 3.36, AG 17, lactate 2.5, Tbili 4.1.  She received a dose of dilaudid and then became apneic and encephalopathic.  This resolved spontaneously.  Prior to resolution, PCCM was asked to see in consultation.  Of note, pt lost hearing in right ear around June 2018.  Daughter tells me that she saw a doctor (unsure if this was ENT, audiologist, or PCP) and was told that her hearing was "gone".  While in ED yesterday 3/28, she began to have left sided hearing loss while in the waiting room.  Daughter states that pt said her hearing was getting "muffled".  By the time she got back to ED room, hearing was completely gone in left ear.  This has persisted and on today's exam, she still can not hear out of either ear.  Daughter is having to communicate via lip reading / talk. Daughter states pt has not had any new medications or used any new  non-prescription meds.   PAST MEDICAL HISTORY :   has a past medical history of Asthma, Diabetes mellitus, Diabetic neuropathy ([place]), Endometrial polyp, Fibroadenoma of breast, GERD (gastroesophageal reflux disease), Headache, Hepatic steatosis, Hyperlipidemia, Hypertension, Impingement syndrome of right shoulder, Nontraumatic tear of right rotator cuff, Pneumonia, Psoriasis, Rheumatoid arthritis(714.0), Sleep apnea, Thyroid disease, and Unspecified deficiency anemia.  has a past surgical history that includes Tubal ligation; Hand surgery; Tonsillectomy; left knee surgery nodule removal; Other surgical history; Dilation and curettage of uterus; Eye surgery; Cesarean section; Ankle arthroscopy (Right); Colonoscopy; and Shoulder arthroscopy with rotator cuff repair and subacromial decompression (Right, 05/20/2014). Prior to Admission medications   Medication Sig Start Date End Date Taking? Authorizing Provider  abatacept (ORENCIA) 250 MG injection Inject 1,000 mg into the vein every 28 (twenty-eight) days. Monthly Infusion   Yes [provider]  albuterol (PROVENTIL HFA;VENTOLIN HFA) 108 (90 Base) MCG/ACT inhaler Inhale 2 puffs into the lungs every 6 (six) hours as needed for wheezing or shortness of breath.   Yes [provider]  amitriptyline (ELAVIL) 25 MG tablet Take 25 mg by mouth at bedtime.    Yes [provider]  ARTIFICIAL TEAR OP Place 1 drop into both eyes as needed (for dry eyes).   Yes [provider]  BYSTOLIC 10 MG tablet Take 20 mg by mouth daily.  10/07/16  Yes [provider]  chlorthalidone (HYGROTON) 25 MG tablet Take 25 mg by mouth daily.   Yes [provider]  CRESTOR 20 MG tablet Take 20 mg by mouth daily.  03/14/14  Yes [provider]  diltiazem (CARDIZEM CD) 240 MG 24 hr capsule Take 240 mg by mouth daily.  02/03/14  Yes [provider]  fluticasone (FLONASE) 50 MCG/ACT nasal spray Place 1 spray into both  nostrils daily as needed for allergies or rhinitis.   Yes [provider]  gabapentin (NEURONTIN) 300 MG capsule Take 600 mg by mouth 3 (three) times daily.    Yes [provider]  HYDROcodone-acetaminophen (NORCO/VICODIN) 5-325 MG tablet Take 1 tablet by mouth every 6 (six) hours as needed for moderate pain.   Yes [provider]  insulin aspart (NOVOLOG) 100 UNIT/ML injection Inject 50 Units into the skin 3 (three) times daily before meals. 55 base Plus sliding scale    Yes [provider]  insulin NPH (HUMULIN N,NOVOLIN N) 100 UNIT/ML injection Inject 75 Units into the skin 3 (three) times daily with meals. 75 units with breakfast; lunch 75 units; 75 units at dinner   Yes [provider]  irbesartan (AVAPRO) 300 MG tablet Take 300 mg by mouth daily.    Yes [provider]  ISOtretinoin (ACCUTANE) 40 MG capsule Take 40 mg by mouth daily.   Yes [provider]  leflunomide (ARAVA) 20 MG tablet Take 20 mg by mouth daily.   Yes [provider]  magnesium oxide (MAG-OX) 400 MG tablet Take 400 mg by mouth daily.   Yes [provider]  meclizine (ANTIVERT) 25 MG tablet Take 25 mg by mouth 3 (three) times daily as needed for dizziness.   Yes [provider]  metFORMIN (GLUCOPHAGE-XR) 500 MG 24 hr tablet Take 500 mg by mouth every evening.   Yes [provider]  pantoprazole (PROTONIX) 40 MG tablet Take 40 mg by mouth daily.   Yes [provider]  spironolactone (ALDACTONE) 25 MG tablet Take 25 mg by mouth daily. 08/27/17  Yes [provider]  topiramate (TOPAMAX) 100 MG tablet Take 100 mg by mouth at bedtime.  07/20/16  Yes [provider]  TRULICITY 9.37 JI/9.6VE SOPN Inject 0.75 mg into the skin once a week. Patient takes on Wednesday. 08/29/15  Yes [provider]  Vitamin D, Ergocalciferol, (DRISDOL) 50000 units CAPS capsule Take 50,000 Units by mouth every 7 (seven) days.    Yes [provider]  VOLTAREN 1 % GEL Apply 1 application topically 3 (three) times daily as needed (for pain).  02/03/14  Yes [provider]  lidocaine (LIDODERM) 5 % Place 1 patch onto the skin daily. Remove & Discard patch within 12 hours or as directed by MD Patient not taking: Reported on 08/18/2017 08/27/17   Petrucelli, Glynda Jaeger, PA-C  methocarbamol (ROBAXIN) 500 MG tablet Take 2 tablets (1,000 mg total) by mouth 4 (four) times daily as needed (Pain). Patient not taking: Reported on 08/16/2017 09/04/17   Pisciotta, Elmyra Ricks, PA-C  naproxen (NAPROSYN) 500 MG tablet Take 1 tablet (500 mg total) by mouth 2 (two) times daily. Patient not taking: Reported on 08/26/2017 08/27/17   Petrucelli, Glynda Jaeger, PA-C   Allergies  Allergen Reactions  . Bactrim [Sulfamethoxazole-Trimethoprim] Hives  . Cefuroxime Axetil Itching  . Cephalosporins Itching  . Iohexol Itching and Rash     Code: RASH, Desc: HAD ITCHING AND A RASH ABOUT ONE HOUR AFTER RETURNING HOME FROM THE CT, Onset Date:  [id number]   . Lisinopril Cough  . Penicillins Hives  . Sulfa Antibiotics Hives    FAMILY HISTORY:  family history includes Aneurysm in her father; Cancer in her brother and mother; Diabetes in her mother and sister. SOCIAL HISTORY:  reports that she quit smoking about 4 years ago. Her smoking use included cigarettes. She has a 1.50 pack-year smoking history. She has never used smokeless tobacco. She reports that she does not drink alcohol or use drugs.  REVIEW OF SYSTEMS:  All negative; except for those that are bolded, which indicate positives - obtained with help of daughter using lip reading.  Constitutional: weight loss, weight gain, night sweats, fevers, chills, fatigue, weakness.  HEENT: headaches, sore throat, sneezing, nasal congestion, post nasal drip, difficulty swallowing, tooth/dental problems, visual complaints, visual changes, ear aches. Neuro: difficulty with speech, weakness, numbness,  ataxia, finger tingling on bilateral hands. CV:  chest pain, orthopnea, PND, swelling in lower extremities, dizziness, palpitations, syncope.  Resp: cough, hemoptysis, dyspnea, wheezing. GI: heartburn, indigestion, abdominal pain, nausea, vomiting, diarrhea, constipation, change in bowel habits, loss of appetite, hematemesis, melena, hematochezia.  GU: dysuria, change in color of urine, urgency or frequency, flank pain, hematuria. MSK: joint pain or swelling, decreased range of motion, chronic pain, right toe pain. Psych: change in mood or affect, depression, anxiety, suicidal ideations, homicidal ideations. Skin: rash, itching, bruising.   SUBJECTIVE:  Pt awake sitting on ED stretcher giving pharmacy med rec with assistance of daughter using lip reading. Vitals stable.  VITAL SIGNS: Temp:  [97.8 F (36.6 C)-99 F (37.2 C)] 97.8 F (36.6 C) (03/29 1210) Pulse Rate:  [31-108] 78 (03/29 1210) Resp:  [16-25] 18 (03/29 1210) BP: (90-133)/(55-108) 92/61 (03/29 1210) SpO2:  [95 %-100 %] 100 % (03/29 1210) Weight:  [117.9 kg (260 lb)] 117.9 kg (260 lb) (03/29 0900)  PHYSICAL EXAMINATION: General: Adult female, resting in bed, in NAD. Neuro: A&O x 3, non-focal.  Has complete hearing loss. HEENT: [place]/AT. EOMI, sclerae anicteric.  Has moderate cerumen in both ear canals. Cardiovascular: RRR, no M/R/G.  Lungs: Respirations even and unlabored.  CTA bilaterally, No W/R/R. Abdomen: Obese, BS x 4, soft, mild tenderness throughout. Musculoskeletal: No gross deformities, 1+ non pitting edema.  Right great toe erythematous and edematous with mild tenderness. Skin: Intact, warm, no rashes.    Recent Labs  Lab 09/06/2017 0632  NA 132*  K 5.1  CL 97*  CO2 18*  BUN 69*  CREATININE 3.36*  GLUCOSE 337*   Recent Labs  Lab 08/29/2017 0632  HGB 7.6*  HCT 23.3*  WBC 25.1*  PLT 423*   Dg Wrist Complete Left  Result Date: 08/08/2017 CLINICAL DATA:  Chronic low back pain.  Body aches. EXAM: LEFT  WRIST - COMPLETE 3+ VIEW COMPARISON:  No recent. FINDINGS: Diffuse severe degenerative change left wrist. Carpal cysts noted most likely degenerative. Probable postsurgical changes of the trapezium. No evidence of acute fracture or dislocation. IMPRESSION: Diffuse severe degenerative change left wrist. Probable postsurgical changes of the trapezium. No acute abnormality identified. Electronically Signed   By: Marcello Moores  Register   On: 09/02/2017 10:54   Ct Head Wo Contrast  Result Date: 08/22/2017 CLINICAL DATA:  Altered level of consciousness. EXAM: CT HEAD WITHOUT CONTRAST TECHNIQUE: Contiguous axial images were obtained from the base of the skull through the vertex without intravenous contrast. COMPARISON:  01/17/2017 FINDINGS: Brain: No acute intracranial abnormality. Specifically, no hemorrhage, hydrocephalus, mass lesion, acute infarction, or significant intracranial injury. Vascular: No hyperdense vessel or unexpected  calcification. Skull: No acute calvarial abnormality. Sinuses/Orbits: Visualized paranasal sinuses and mastoids clear. Orbital soft tissues unremarkable. Other: None IMPRESSION: Normal study. Electronically Signed   By: Rolm Baptise M.D.   On: 08/13/2017 10:32   Dg Chest Port 1 View  Result Date: 08/15/2017 CLINICAL DATA:  Chronic low back pain, BILATERAL arm pain, generalized body aches for several months, increased pain with movement and change in position, history of type II diabetes mellitus, asthma, hypertension, rheumatoid arthritis, former smoker EXAM: PORTABLE CHEST 1 VIEW COMPARISON:  Portable exam 0859 hours compared to 11/30/2015 FINDINGS: Enlargement of cardiac silhouette. Mediastinal contours and pulmonary vascularity normal. Lungs grossly clear. No definite infiltrate, pleural effusion or pneumothorax. IMPRESSION: Enlargement of cardiac silhouette. No acute abnormalities. Electronically Signed   By: Lavonia Dana M.D.   On: 08/23/2017 09:34   Dg Foot Complete Right  Result  Date: 08/22/2017 CLINICAL DATA:  Right foot pain. Rule out osteomyelitis of great toe. EXAM: RIGHT FOOT COMPLETE - 3+ VIEW COMPARISON:  09/13/2016 FINDINGS: No bony destructive changes noted to suggest osteomyelitis. Degenerative changes at the great toe IP joint. Mild soft tissue swelling along the dorsum of the foot. IMPRESSION: No visible radiographic changes of osteomyelitis. No acute bony abnormality. Electronically Signed   By: Rolm Baptise M.D.   On: 09/03/2017 10:51    STUDIES:  CT head 3/29 > negative. R foot XR 3/29 > no changes to suggest osteo. L wrist XR 3/29 > diffuse severe degenerative changes. Renal US 3/29 >   SIGNIFICANT EVENTS  3/29 > admit.  ASSESSMENT / PLAN:  Hyponatremia - presumed hypovolemic in setting decreased PO intake. AKI - as above.  She is making urine, last occurrence evening of 3/28 (daughter describes it as "dark").  Suspect that this will correct with hydration. AGMA - lactate + renal failure. Plan: Continue aggressive IVF hydration. Assess renal US for completeness. Repeat BMP now and in AM.  Hyperbilirubinemia - unclear etiology thus far.  ? Hemolytic process given concomitant drop in Hgb (10 on 08/19/17 and 7.6 today on admit). Plan: Trend LFT's. Assess LDH, haptoglobin, peripheral smear, anemia panel.  Anemia - has hx of thalassemia. Plan: Transfuse for Hgb < 7. As above (Assess LDH, haptoglobin, peripheral smear, anemia panel). Hematology consulted by EDP. Follow CBC.  Possible occult sepsis - unclear etiology and no symptoms / history to support, but given degree of leukocytosis, agree with empiric coverage while awaiting further workup. Plan: Continue empiric abx and follow cultures. Check PCT. Repeat lactate.  Hx DM. Plan: SSI. Hold preadmission metformin, novolog, humulin.  Sudden hearing loss - unclear etiology. Plan: Recommend ENT consult.  OK for SDU admission under [place]. Await hematology recs and recommend ENT  eval.   Montey Hora, PA - C [facility] Pager: [phone]  or [phone] 08/20/2017, 12:11 PM

## 2017-09-05 NOTE — ED Notes (Signed)
Patient transported to Ultrasound 

## 2017-09-05 NOTE — ED Notes (Signed)
Pt moaning states her toe and back hurt. Per family pt recently lost her hearing. Pt also has soreness to left arm.

## 2017-09-05 NOTE — ED Notes (Addendum)
Dr. Johnney Killian aware of lactic acid and venous blood gas

## 2017-09-05 NOTE — H&P (Addendum)
History and Physical    Jessica Howard AST:[id number] DOB: 21-Mar-1961 DOA: 08/12/2017  **Will admit patient based on the expectation that the patient will need hospitalization/ hospital care that crosses at least 2 midnights  PCP: Merrilee Seashore, MD   Attending physician: Aggie Moats  Patient coming from/Resides with: Private residence  Chief Complaint: Back pain, confusion, poor intake, no urination  HPI: Jessica Howard is a 57 y.o. female with medical history significant for rheumatoid arthritis on immunosuppressive agents, chronic pain syndrome, hypertension, dyslipidemia, diabetes on insulin and metformin, acute unexplained hearing loss June 2018 to right ear, anemia secondary to beta thalassemia, and diabetic neuropathy.  The patient has been having ongoing/uncontrolled nonspecified back pain for about 2 weeks.  Since onset she has seen her PCP twice as well as been to the ER twice with no definitive explanation found.  Plan of care was analgesics with narcotics and other medications as well as planned referral to neurologist for further evaluation.  Patient was last seen in the ER on 3/28 with unexplained finger tingling.  Her pain was treated.  Her Flexeril that she had previously been prescribed was changed to Robaxin with discharge recommendation to follow-up with PCP, rheumatologist and consideration of pain management evaluation.  At the time she had no significant hemodynamic or lab abnormalities.  She was noted to have some nonspecified joint swelling.    She returned back to the ER this morning with complaints of shaking, confusion, decreased to near absent urination with dark urine noted, significant left wrist and hand swelling w/ pain as well as significant pain in the right great toe with dusky discoloration.  Has had poor oral intake over the past 3-4 days.  She was found to have marked lab abnormalities with acute kidney injury BUN 69 and creatinine 3.36 with an associated  anion gap acidosis, elevated alkaline phosphatase, elevated AST, and elevated total bilirubin up to 4.1.  She had a significant leukocytosis of 25,100 with neutrophils 78% and absolute neutrophils 22.1% with only low-grade fever noting patient is also on immunosuppressive medications of Arava and Orencia.  Hemoglobin has also dropped from 7.6 from a baseline of around 9.4-9.7.  Of note patient requires regular Aranesp injections because of her underlying beta thalassemic anemia.  Her lactic acid was also elevated at 2.58 and 2.59.  Given the above findings concerns were of 1) sepsis of uncertain etiology and 2) possible hemolysis in the setting of elevated total bilirubin and new acute anemia.  EDP has ordered empiric antibiotics, 30 cc/KG fluid challenge and 2 units of packed red blood cells.  Also peripheral smear has been ordered. PCCM consulted and felt the patient safe for an appropriate for stepdown level unit additional orders placed including renal ultrasound, LDH and haptoglobin to rule out possible hemolysis.  Addition to the above findings patient also developed acute hearing loss involving the left ear beginning yesterday while in the waiting room.  Patient had a similar presentation in June where she lost hearing in the right ear.  She apparently has followed up with some sort of provider who stated that the hearing was permanently "gone".  Patient will be admitted at stepdown unit for a presumptive diagnosis of sepsis of uncertain etiology.  ED Course:  Vital Signs: BP 92/61   Pulse 78   Temp 97.8 F (36.6 C) (Oral)   Resp 18   Ht 5' 2"  (1.575 m)   Wt 117.9 kg (260 lb)   LMP 12/27/2013 Comment: irreggular  SpO2 100%   BMI 47.55 kg/m  Chest x-ray: Neg CT head without contrast: Neg DG right foot: No radiographic changes of osteomyelitis and no acute bony abnormality DG left wrist: Diffuse severe degenerative changes of the left wrist, probable postsurgical changes of the trapezium  (linear scar noted over wrist on clinical exam) otherwise no acute abnormality identified Lab data: Sodium 132, potassium 5.1, chloride 97, CO2 18, glucose 337, BUN 69, creatinine 3.36, calcium 7.6-corrected calcium 9.2, anion gap 17, phosphorus 5.9, magnesium 2.3, alkaline phosphatase 294, albumin 2.0, AST 53, total bilirubin 4.1, CK 55 white count 25,100 with neutrophils 78% and absolute neutrophils 22.1%, hemoglobin 7.6, hematocrit 23.3, MCV 61 Medications and treatments: Normal saline bolus times 4500 cc, vancomycin 2 g IV x1, aztreonam 2 g IV x1, Flagyl 500 mg IV x1  Review of Systems:  In addition to the HPI above,  No Fever No Headache, changes with Vision, new focal weakness, dysarthria or word finding difficulty, gait disturbance or imbalance, tremors or seizure activity No problems swallowing food or Liquids, indigestion/reflux, choking or coughing while eating, abdominal pain with or after eating No Chest pain, Cough or Shortness of Breath, palpitations, orthopnea or DOE No Abdominal pain reported by patient including no postprandial discomfort or indigestion, N/V, melena,hematochezia, dark tarry stools, constipation No dysuria, malodorous urine, hematuria or flank pain No new skin rashes, lesions, masses or bruises No recent unintentional weight gain or loss No polyuria, polydypsia or polyphagia   Past Medical History:  Diagnosis Date  . Asthma   . Beta thalassemia w/chronic anemia    Thalassemia  . Diabetes mellitus type 2, insulin dependent (HCC)    type 2  . Diabetic neuropathy ([place])   . Endometrial polyp   . Fibroadenoma of breast    left  . GERD (gastroesophageal reflux disease)   . Headache    migraines  . Hearing loss, right   . Hepatic steatosis   . Hyperlipidemia   . Hypertension   . Immunocompromised state due to drug therapy   . Impingement syndrome of right shoulder   . Nontraumatic tear of right rotator cuff   . Pneumonia   . Psoriasis   . Rheumatoid  arthritis(714.0)    rhematoid and osteoarthritis  . Sleep apnea   . Thyroid disease    had radiation    Past Surgical History:  Procedure Laterality Date  . ANKLE ARTHROSCOPY Right   . CESAREAN SECTION    . COLONOSCOPY    . DILATION AND CURETTAGE OF UTERUS    . EYE SURGERY     surgery on retina  . HAND SURGERY     for arthritis  . left knee surgery nodule removal    . OTHER SURGICAL HISTORY     multiple nodule removal on neck, hands, left knee  . SHOULDER ARTHROSCOPY WITH ROTATOR CUFF REPAIR AND SUBACROMIAL DECOMPRESSION Right 05/20/2014   Procedure: RIGHT SHOULDER ARTHROSCOPY WITH DEBRIDEMENT/DISTAL CLAVICLE EXCISION/ROTATOR CUFF REPAIR AND SUBACROMIAL DECOMPRESSION;  Surgeon: Lorn Junes, MD;  Location: [facility];  Service: Orthopedics;  Laterality: Right;  . TONSILLECTOMY    . TUBAL LIGATION     bilateral    Social History   Socioeconomic History  . Marital status: Divorced    Spouse name: Not on file  . Number of children: Not on file  . Years of education: Not on file  . Highest education level: Not on file  Occupational History  . Not on file  Social Needs  . Emergency planning/management officer  strain: Not on file  . Food insecurity:    Worry: Not on file    Inability: Not on file  . Transportation needs:    Medical: Not on file    Non-medical: Not on file  Tobacco Use  . Smoking status: Former Smoker    Packs/day: 0.25    Years: 6.00    Pack years: 1.50    Types: Cigarettes    Last attempt to quit: 06/09/2013    Years since quitting: 4.2  . Smokeless tobacco: Never Used  Substance and Sexual Activity  . Alcohol use: No  . Drug use: No  . Sexual activity: Yes    Birth control/protection: Surgical  Lifestyle  . Physical activity:    Days per week: Not on file    Minutes per session: Not on file  . Stress: Not on file  Relationships  . Social connections:    Talks on phone: Not on file    Gets together: Not on file    Attends religious service: Not on file     Active member of club or organization: Not on file    Attends meetings of clubs or organizations: Not on file    Relationship status: Not on file  . Intimate partner violence:    Fear of current or ex partner: Not on file    Emotionally abused: Not on file    Physically abused: Not on file    Forced sexual activity: Not on file  Other Topics Concern  . Not on file  Social History Narrative  . Not on file    Mobility: Utilizes a cane Work history: Not obtained   Allergies  Allergen Reactions  . Bactrim [Sulfamethoxazole-Trimethoprim] Hives  . Cefuroxime Axetil Itching  . Cephalosporins Itching  . Iohexol Itching and Rash     Code: RASH, Desc: HAD ITCHING AND A RASH ABOUT ONE HOUR AFTER RETURNING HOME FROM THE CT, Onset Date: [id number]   . Lisinopril Cough  . Penicillins Hives  . Sulfa Antibiotics Hives    Family History  Problem Relation Age of Onset  . Diabetes Mother   . Cancer Mother        breast  . Aneurysm Father        brain  . Diabetes Sister   . Cancer Brother        bone marrow cancer     Prior to Admission medications   Medication Sig Start Date End Date Taking? Authorizing Provider  abatacept (ORENCIA) 250 MG injection Inject 1,000 mg into the vein every 28 (twenty-eight) days. Monthly Infusion   Yes [provider]  albuterol (PROVENTIL HFA;VENTOLIN HFA) 108 (90 Base) MCG/ACT inhaler Inhale 2 puffs into the lungs every 6 (six) hours as needed for wheezing or shortness of breath.   Yes [provider]  amitriptyline (ELAVIL) 25 MG tablet Take 25 mg by mouth at bedtime.    Yes [provider]  ARTIFICIAL TEAR OP Place 1 drop into both eyes as needed (for dry eyes).   Yes [provider]  BYSTOLIC 10 MG tablet Take 20 mg by mouth daily.  10/07/16  Yes [provider]  chlorthalidone (HYGROTON) 25 MG tablet Take 25 mg by mouth daily.   Yes [provider]  CRESTOR 20 MG tablet Take 20 mg by mouth daily.   03/14/14  Yes [provider]  diltiazem (CARDIZEM CD) 240 MG 24 hr capsule Take 240 mg by mouth daily.  02/03/14  Yes [provider]  fluticasone (FLONASE) 50 MCG/ACT nasal spray Place 1 spray into both nostrils daily as needed for allergies or rhinitis.   Yes [provider]  gabapentin (NEURONTIN) 300 MG capsule Take 600 mg by mouth 3 (three) times daily.    Yes [provider]  HYDROcodone-acetaminophen (NORCO/VICODIN) 5-325 MG tablet Take 1 tablet by mouth every 6 (six) hours as needed for moderate pain.   Yes [provider]  insulin aspart (NOVOLOG) 100 UNIT/ML injection Inject 50 Units into the skin 3 (three) times daily before meals. 55 base Plus sliding scale    Yes [provider]  insulin NPH (HUMULIN N,NOVOLIN N) 100 UNIT/ML injection Inject 75 Units into the skin 3 (three) times daily with meals. 75 units with breakfast; lunch 75 units; 75 units at dinner   Yes [provider]  irbesartan (AVAPRO) 300 MG tablet Take 300 mg by mouth daily.    Yes [provider]  ISOtretinoin (ACCUTANE) 40 MG capsule Take 40 mg by mouth daily.   Yes [provider]  leflunomide (ARAVA) 20 MG tablet Take 20 mg by mouth daily.   Yes [provider]  magnesium oxide (MAG-OX) 400 MG tablet Take 400 mg by mouth daily.   Yes [provider]  meclizine (ANTIVERT) 25 MG tablet Take 25 mg by mouth 3 (three) times daily as needed for dizziness.   Yes [provider]  metFORMIN (GLUCOPHAGE-XR) 500 MG 24 hr tablet Take 500 mg by mouth every evening.   Yes [provider]  pantoprazole (PROTONIX) 40 MG tablet Take 40 mg by mouth daily.   Yes [provider]  spironolactone (ALDACTONE) 25 MG tablet Take 25 mg by mouth daily. 08/27/17  Yes [provider]  topiramate (TOPAMAX) 100 MG tablet Take 100 mg by mouth at bedtime.  07/20/16  Yes [provider]  TRULICITY 2.11 HE/1.7EY  SOPN Inject 0.75 mg into the skin once a week. Patient takes on Wednesday. 08/29/15  Yes [provider]  Vitamin D, Ergocalciferol, (DRISDOL) 50000 units CAPS capsule Take 50,000 Units by mouth every 7 (seven) days.   Yes [provider]  VOLTAREN 1 % GEL Apply 1 application topically 3 (three) times daily as needed (for pain).  02/03/14  Yes [provider]  lidocaine (LIDODERM) 5 % Place 1 patch onto the skin daily. Remove & Discard patch within 12 hours or as directed by MD Patient not taking: Reported on 08/09/2017 08/27/17   Petrucelli, Glynda Jaeger, PA-C  methocarbamol (ROBAXIN) 500 MG tablet Take 2 tablets (1,000 mg total) by mouth 4 (four) times daily as needed (Pain). Patient not taking: Reported on 08/08/2017 09/04/17   Pisciotta, Elmyra Ricks, PA-C  naproxen (NAPROSYN) 500 MG tablet Take 1 tablet (500 mg total) by mouth 2 (two) times daily. Patient not taking: Reported on 08/25/2017 08/27/17   Amaryllis Dyke, PA-C    Physical Exam: Vitals:   08/25/2017 1000 08/08/2017 1153 08/15/2017 1200 08/27/2017 1210  BP: (!) 115/95 90/72 91/64  92/61  Pulse: 80 79 76 78  Resp: 16 (!) 25 (!) 21 18  Temp:  98 F (36.7 C)  97.8 F (36.6 C)  TempSrc:  Oral  Oral  SpO2: 96% 100% 100% 100%  Weight:      Height:          Constitutional: Sleepy but awakens easily, appears acutely ill Eyes: PERRL, lids and conjunctivae normal-bilateral scleral icterus ENMT: Mucous membranes are extremely dry. Posterior pharynx clear of any exudate  or lesions. Poor dentition.  Marked dried cerumen bilateral ear canals. Neck: normal, supple, no masses, no thyromegaly Respiratory: clear to auscultation bilaterally, no wheezing, no crackles. Normal respiratory effort. No accessory muscle use.  Cardiovascular: Regular rate and rhythm, no murmurs / rubs / gallops. No extremity edema.  Radial pulses easily palpated bilaterally, 1+ pedal pulses on left-unable to palpate pedal pulse on right. No carotid  bruits.  Abdomen: Focal right upper quadrant tenderness guarding but no rebound, no masses palpated. No hepatosplenomegaly. Bowel sounds positive.  Musculoskeletal: no clubbing / cyanosis. No joint deformity upper and lower extremities. Good ROM, no contractures. Normal muscle tone.  Somewhat tender right upper back just lateral to rib cage.  Left wrist and hand swollen and slightly warm to touch and exquisitely tender to touch with minimal palpation.  Right great toe dusky in appearance with slight redness at the proximal base of the toe, adjacent next to toes also somewhat dusky in appearance.  Right great toe also exquisitely tender to touch with minimal palpation Skin: no rashes, lesions, ulcers. No induration Neurologic: CN 2-12 grossly intact except for acute hearing loss. Sensation intact, DTR normal. Strength 4/5 x all 4 extremities.  Psychiatric: Alert and oriented x 3-communication primarily through written notes to patient. Normal mood.  Smiles appropriately.        Labs on Admission: I have personally reviewed following labs and imaging studies  CBC: Recent Labs  Lab 08/21/2017 0632  WBC 25.1*  NEUTROABS 22.1*  HGB 7.6*  HCT 23.3*  MCV 61.0*  PLT 480*   Basic Metabolic Panel: Recent Labs  Lab 08/28/2017 0632  NA 132*  K 5.1  CL 97*  CO2 18*  GLUCOSE 337*  BUN 69*  CREATININE 3.36*  CALCIUM 7.6*  MG 2.3  PHOS 5.9*   GFR: Estimated Creatinine Clearance: 23.1 mL/min (A) (by C-G formula based on SCr of 3.36 mg/dL (H)). Liver Function Tests: Recent Labs  Lab 08/31/2017 0632  AST 53*  ALT 24  ALKPHOS 294*  BILITOT 4.1*  PROT 6.5  ALBUMIN 2.0*   Recent Labs  Lab 08/17/2017 0815  LIPASE 28   Recent Labs  Lab 08/14/2017 0956  AMMONIA 38*   Coagulation Profile: No results for input(s): INR, PROTIME in the last 168 hours. Cardiac Enzymes: Recent Labs  Lab 08/24/2017 0632  CKTOTAL 55   BNP (last 3 results) No results for input(s): PROBNP in the last 8760  hours. HbA1C: No results for input(s): HGBA1C in the last 72 hours. CBG: Recent Labs  Lab 09/04/2017 1203  GLUCAP 313*   Lipid Profile: No results for input(s): CHOL, HDL, LDLCALC, TRIG, CHOLHDL, LDLDIRECT in the last 72 hours. Thyroid Function Tests: No results for input(s): TSH, T4TOTAL, FREET4, T3FREE, THYROIDAB in the last 72 hours. Anemia Panel: No results for input(s): VITAMINB12, FOLATE, FERRITIN, TIBC, IRON, RETICCTPCT in the last 72 hours. Urine analysis:    Component Value Date/Time   COLORURINE YELLOW 01/19/2016 1545   APPEARANCEUR HAZY (A) 01/19/2016 1545   LABSPEC 1.030 01/19/2016 1545   PHURINE 5.5 01/19/2016 1545   GLUCOSEU >1000 (A) 01/19/2016 1545   HGBUR NEGATIVE 01/19/2016 1545   BILIRUBINUR NEGATIVE 01/19/2016 1545   KETONESUR NEGATIVE 01/19/2016 1545   PROTEINUR 30 (A) 01/19/2016 1545   UROBILINOGEN 0.2 02/28/2014 1351   NITRITE NEGATIVE 01/19/2016 1545   LEUKOCYTESUR NEGATIVE 01/19/2016 1545   Sepsis Labs: @LABRCNTIP (procalcitonin:4,lacticidven:4) )No results found for this or any previous visit (from the past 240 hour(s)).   Radiological Exams on  Admission: Dg Wrist Complete Left  Result Date: 09/07/2017 CLINICAL DATA:  Chronic low back pain.  Body aches. EXAM: LEFT WRIST - COMPLETE 3+ VIEW COMPARISON:  No recent. FINDINGS: Diffuse severe degenerative change left wrist. Carpal cysts noted most likely degenerative. Probable postsurgical changes of the trapezium. No evidence of acute fracture or dislocation. IMPRESSION: Diffuse severe degenerative change left wrist. Probable postsurgical changes of the trapezium. No acute abnormality identified. Electronically Signed   By: Marcello Moores  Register   On: 08/27/2017 10:54   Ct Head Wo Contrast  Result Date: 08/10/2017 CLINICAL DATA:  Altered level of consciousness. EXAM: CT HEAD WITHOUT CONTRAST TECHNIQUE: Contiguous axial images were obtained from the base of the skull through the vertex without intravenous  contrast. COMPARISON:  01/17/2017 FINDINGS: Brain: No acute intracranial abnormality. Specifically, no hemorrhage, hydrocephalus, mass lesion, acute infarction, or significant intracranial injury. Vascular: No hyperdense vessel or unexpected calcification. Skull: No acute calvarial abnormality. Sinuses/Orbits: Visualized paranasal sinuses and mastoids clear. Orbital soft tissues unremarkable. Other: None IMPRESSION: Normal study. Electronically Signed   By: Rolm Baptise M.D.   On: 08/20/2017 10:32   Dg Chest Port 1 View  Result Date: 08/17/2017 CLINICAL DATA:  Chronic low back pain, BILATERAL arm pain, generalized body aches for several months, increased pain with movement and change in position, history of type II diabetes mellitus, asthma, hypertension, rheumatoid arthritis, former smoker EXAM: PORTABLE CHEST 1 VIEW COMPARISON:  Portable exam 0859 hours compared to 11/30/2015 FINDINGS: Enlargement of cardiac silhouette. Mediastinal contours and pulmonary vascularity normal. Lungs grossly clear. No definite infiltrate, pleural effusion or pneumothorax. IMPRESSION: Enlargement of cardiac silhouette. No acute abnormalities. Electronically Signed   By: Lavonia Dana M.D.   On: 08/14/2017 09:34   Dg Foot Complete Right  Result Date: 08/30/2017 CLINICAL DATA:  Right foot pain. Rule out osteomyelitis of great toe. EXAM: RIGHT FOOT COMPLETE - 3+ VIEW COMPARISON:  09/13/2016 FINDINGS: No bony destructive changes noted to suggest osteomyelitis. Degenerative changes at the great toe IP joint. Mild soft tissue swelling along the dorsum of the foot. IMPRESSION: No visible radiographic changes of osteomyelitis. No acute bony abnormality. Electronically Signed   By: Rolm Baptise M.D.   On: 08/24/2017 10:51    EKG: (Independently reviewed) sinus rhythm with ventricular rate 83 bpm, QTC 492 ms, normal R wave rotation, no acute ischemic changes.  Assessment/Plan Principal Problem:   Sepsis / Immunosuppression due to  drug therapy -Presents with back pain, altered mentation, significant leukocytosis, acute kidney injury, and multiple potential sources of infection (left wrist, right great toe, right upper quadrant pain and transaminitis,?? urine since urinalysis not yet obtained) in a patient on chronic immunosuppression with Arava and [place] with continuing broad-spectrum antibiotics with aztreonam, Flagyl and vancomycin -Follow-up on blood cultures; urinalysis and urine culture once obtained **UA c/w UTI -Obtain procalcitonin (11.11), ESR -Continue to cycle lactic acid -Patient is not hypotensive but blood pressures are suboptimal noting her baseline SBP typically in the 150s and current SBP 90s so will continue maintenance fluid at 125/hr -Strict I's/O -Hold home immunosuppressive agents  Active Problems:   Acute kidney injury associated anion gap acidosis/acute hyperkalemia -Baseline renal function completely normal with a creatinine of 0.8 -Current creatinine 3.36 -Patient with poor oral intake prior to arrival and clearly dehydrated on exam so prerenal etiology considered in conjunction with prehospital administration of the following diuretics and/or nephrotoxic medications: Chlorthalidone, Avapro, metformin, and Aldactone -Follow labs **Rpt labs show K+ 5.6 s will cont to hydrate and give  Ca Gluc 1 amp  -Continue to hydrate and keep SBP >/= 90 (avoid hypotension) -Insert Foley catheter for accurate intake and output -Renal ultrasound to rule out obstructive causes/hydronephrosis **No hydronephrosis or obstructive process    Transaminitis -Patient presents with back pain ongoing for about 2 weeks, found to have elevated total bilirubin, mild elevation in AST, moderate elevation in alkaline phosphatase and on clinical exam was found to have right upper quadrant tenderness all concerning for possible smoldering cholecystitis -EDP also concerned over possible hemolysis-evaluation below under  anemia -Abdominal US with focus on right upper quadrant **No gallstones/cholecystitis -Hepatic function panel for fractionated bilirubin **direct bilirubin 3.5, indirect bilirubin 1.9, total bilirubin 5.4 -NPO -Follow labs -Low-dose morphine IV for pain given suboptimal blood pressure readings-patient had been taking narcotic-based pain medication prior to admission and could have been masking symptoms-unable to use NSAIDs secondary to acute kidney injury    Acute hearing loss of left ear  -Similar episode last June and has apparent permanent hearing loss right ear -No new medications -She will need to follow-up with audiologist after discharge -Certainly bilateral cerumen impaction likely contributing to hearing loss but hearing needs to be retested once ear canals cleansed. -May need to have assistance of pharmacy to go through all of patient's multiple medications to ensure hearing loss not related to home medications    Beta thalassemia/Symptomatic anemia -Baseline hemoglobin around 9.4-9.7 with current hemoglobin 7.6 -Followed by hematology as an outpatient and receives periodic Aranesp -Given elevated total bilirubin concerns have been postulated over possible hemolytic process noting platelets are normal-check reticulocyte count, agree with LDH (382) and haptoglobin and peripheral smear (no schistocytes)-would need to consult hematology if hemolysis confirmed **indirect bilirubin lower than direct bilirubin which is not consistent with hemolysis -Reticulocyte count is normal -EDP has ordered 2 units RBCs with first unit infusing now -Follow-up CBC after transfusion and again in a.m. -Anemia panel on 08/19/17: Iron 125, UIBC 143, TIBC 268, sat ratio 47, ferritin 417-PCCM is reordered anemia panel this admission    Rheumatoid arthritis -On chronic immunosuppressive agents as described above -Family states that patient's current swelling in left wrist and great toe were not typical of her  usual exacerbation of rheumatoid arthritis -Check ESR    Acute pain of left wrist -Does not appear to be typical of rheumatoid arthritis flares as described -?  Septic joint -Consider CT and/or MR of left wrist to better clarify specially if blood cultures are positive -Uric acid elevated at 10.3- ? Gout- unable to tx w/ NSAIDs 2/2 AKI- consider steroids if sepsis physiology improves over next 24 hrs    Great toe pain, right -Toe swollen and dusky and appears consistent with possible embolic event given concerns over sepsis could be related to septic emboli -Bilateral lower extremity arterial duplex to better clarify -Neurovascular checks q 2 hrs- may need to doppler pulse right foot    Diabetes mellitus, insulin dependent (IDDM), uncontrolled  -Hold preadmission NPH insulin and metformin -CBGs every 4 hours with SSI    Hypertension -Current blood pressure suboptimal so preadmission Bystolic, Avapro, fourth Eylea and Aldactone on hold -Echocardiogram June 2017 with mild LVH, normal LV systolic function, moderate dilatation of left atrium, mild to moderate tricuspid regurgitation    Hyperlipidemia -Hold preadmission statin in context of acute transaminitis -CK normal    **Additional lab, imaging and/or diagnostic evaluation at discretion of supervising physician  DVT prophylaxis: Lovenox dose adjusted for low GFR Code Status: Full Family Communication: Multiple family members  at bedside; also spoke with son in [place] via Facetime Disposition Plan: Home Consults called: Call placed by EDP to hematology    Lazer Wollard L. ANP-BC [facility] Pager [phone]   If 7PM-7AM, please contact night-coverage [email or web address] Password [email or web address]  08/20/2017, 1:30 PM

## 2017-09-05 NOTE — ED Notes (Signed)
Dr. Johnney Killian notified pt became apneic, dropped sats to 60s with staring episode after dilaudid administered. Pt repositioned, placed on 2L [place]. Sats returned to 100. Will continue to monitor.

## 2017-09-05 NOTE — ED Notes (Signed)
Pt returned from [facility]

## 2017-09-05 NOTE — ED Provider Notes (Signed)
[facility]ERGENCY DEPARTMENT Provider Note   CSN: [id number] Arrival date & time: 08/25/2017  0408     History   Chief Complaint Chief Complaint  Patient presents with  . Back Pain    Chronic Pain     HPI Jessica Howard is a 57 y.o. female.  HPI The patient presents with her sister and daughter.  She has become very sick over the past 2 days.  Patient had been seen in the emergency department 2 times in the past 2 weeks for ongoing lower back pain.  She does have chronic pain from rheumatoid arthritis.  Much of that pain had been in the lower back and sciatic in nature.  Over the past several days however she has developed severe pain in her left arm.  That encompasses her wrist and forearm.  Is become swollen and red.  Exquisitely painful to move.  She is also developed pain in her right toe.  Her family members have noted that the right great toe has gotten discolored and looks like it might have an infection.  Over the past 2 days she started eating and drinking much less.  As of last night she became confused and very weak.  They have not documented a fever.  She has not been vomiting.  No diarrhea.  They report she has been very sick and not really able to take her medications for a couple of days. Past Medical History:  Diagnosis Date  . Asthma   . Diabetes mellitus    type 2  . Diabetic neuropathy ([place])   . Endometrial polyp   . Fibroadenoma of breast    left  . GERD (gastroesophageal reflux disease)   . Headache    migraines  . Hepatic steatosis   . Hyperlipidemia   . Hypertension   . Impingement syndrome of right shoulder   . Nontraumatic tear of right rotator cuff   . Pneumonia   . Psoriasis   . Rheumatoid arthritis(714.0)    rhematoid and osteoarthritis  . Sleep apnea   . Thyroid disease    had radiation  . Unspecified deficiency anemia    Thalassemia    Patient Active Problem List   Diagnosis Date Noted  . Sepsis ([place]) 08/27/2017    . Acute kidney injury ([place]) 08/27/2017  . Immunosuppression due to drug therapy 08/08/2017  . Rheumatoid arthritis(714.0) 08/29/2017  . Acute pain of left wrist 08/21/2017  . Great toe pain, right 09/06/2017  . Hypertension 08/17/2017  . Hyperlipidemia 08/13/2017  . Acute hearing loss of right ear 08/11/2017  . Diabetes mellitus, insulin dependent (IDDM), controlled ([place]) 08/15/2017  . Beta thalassemia ([place]) 08/23/2017  . Symptomatic anemia 09/04/2017  . Elevated C-reactive protein (CRP)   . Elevated erythrocyte sedimentation rate   . Leukocytosis   . Hip joint effusion 01/20/2016  . Right hip pain 01/19/2016  . Hip pain 01/19/2016  . Congestive heart failure (CHF) ([place]) 12/01/2015  . Anemia 12/01/2015  . CHF (congestive heart failure) ([place]) 12/01/2015  . Acute diastolic CHF (congestive heart failure) ([place])   . Right rotator cuff tear 05/20/2014  . Nontraumatic tear of right rotator cuff   . Impingement syndrome of right shoulder   . Thalassemia 02/28/2014  . On prednisone therapy 02/28/2014  . Rheumatoid arthritis ([place]) 02/28/2014  . Morbid obesity ([place]) 02/28/2014  . Shortness of breath 02/28/2014  . Renal insufficiency 02/28/2014  . Edema 02/28/2014  . Back pain 02/28/2014  . Essential hypertension  02/28/2014  . Diabetes ([place]) 02/28/2014  . Dyslipidemia 02/28/2014  . Obstructive sleep apnea 02/28/2014  . Acute on chronic diastolic congestive heart failure, NYHA class 2 ([place]) 02/28/2014  . Chest pain 02/27/2014  . Iron deficiency anemia 02/16/2014  . Post-menopausal bleeding 08/28/2012    Past Surgical History:  Procedure Laterality Date  . ANKLE ARTHROSCOPY Right   . CESAREAN SECTION    . COLONOSCOPY    . DILATION AND CURETTAGE OF UTERUS    . EYE SURGERY     surgery on retina  . HAND SURGERY     for arthritis  . left knee surgery nodule removal    . OTHER SURGICAL HISTORY     multiple nodule removal on neck, hands, left knee  . SHOULDER ARTHROSCOPY WITH  ROTATOR CUFF REPAIR AND SUBACROMIAL DECOMPRESSION Right 05/20/2014   Procedure: RIGHT SHOULDER ARTHROSCOPY WITH DEBRIDEMENT/DISTAL CLAVICLE EXCISION/ROTATOR CUFF REPAIR AND SUBACROMIAL DECOMPRESSION;  Surgeon: Lorn Junes, MD;  Location: [facility];  Service: Orthopedics;  Laterality: Right;  . TONSILLECTOMY    . TUBAL LIGATION     bilateral     OB History    Gravida  3   Para  2   Term  2   Preterm  0   AB  1   Living  2     SAB  1   TAB  0   Ectopic  0   Multiple  0   Live Births               Home Medications    Prior to Admission medications   Medication Sig Start Date End Date Taking? Authorizing Provider  abatacept (ORENCIA) 250 MG injection Inject 1,000 mg into the vein every 28 (twenty-eight) days. Monthly Infusion   Yes [provider]  albuterol (PROVENTIL HFA;VENTOLIN HFA) 108 (90 Base) MCG/ACT inhaler Inhale 2 puffs into the lungs every 6 (six) hours as needed for wheezing or shortness of breath.   Yes [provider]  amitriptyline (ELAVIL) 25 MG tablet Take 25 mg by mouth at bedtime.    Yes [provider]  ARTIFICIAL TEAR OP Place 1 drop into both eyes as needed (for dry eyes).   Yes [provider]  BYSTOLIC 10 MG tablet Take 20 mg by mouth daily.  10/07/16  Yes [provider]  chlorthalidone (HYGROTON) 25 MG tablet Take 25 mg by mouth daily.   Yes [provider]  CRESTOR 20 MG tablet Take 20 mg by mouth daily.  03/14/14  Yes [provider]  diltiazem (CARDIZEM CD) 240 MG 24 hr capsule Take 240 mg by mouth daily.  02/03/14  Yes [provider]  fluticasone (FLONASE) 50 MCG/ACT nasal spray Place 1 spray into both nostrils daily as needed for allergies or rhinitis.   Yes [provider]  gabapentin (NEURONTIN) 300 MG capsule Take 600 mg by mouth 3 (three) times daily.    Yes [provider]  HYDROcodone-acetaminophen (NORCO/VICODIN) 5-325 MG tablet Take 1 tablet by  mouth every 6 (six) hours as needed for moderate pain.   Yes [provider]  insulin aspart (NOVOLOG) 100 UNIT/ML injection Inject 50 Units into the skin 3 (three) times daily before meals. 55 base Plus sliding scale    Yes [provider]  insulin NPH (HUMULIN N,NOVOLIN N) 100 UNIT/ML injection Inject 75 Units into the skin 3 (three) times daily with meals. 75 units with breakfast; lunch 75 units; 75 units at dinner  Yes [provider]  irbesartan (AVAPRO) 300 MG tablet Take 300 mg by mouth daily.    Yes [provider]  ISOtretinoin (ACCUTANE) 40 MG capsule Take 40 mg by mouth daily.   Yes [provider]  leflunomide (ARAVA) 20 MG tablet Take 20 mg by mouth daily.   Yes [provider]  magnesium oxide (MAG-OX) 400 MG tablet Take 400 mg by mouth daily.   Yes [provider]  meclizine (ANTIVERT) 25 MG tablet Take 25 mg by mouth 3 (three) times daily as needed for dizziness.   Yes [provider]  metFORMIN (GLUCOPHAGE-XR) 500 MG 24 hr tablet Take 500 mg by mouth every evening.   Yes [provider]  pantoprazole (PROTONIX) 40 MG tablet Take 40 mg by mouth daily.   Yes [provider]  spironolactone (ALDACTONE) 25 MG tablet Take 25 mg by mouth daily. 08/27/17  Yes [provider]  topiramate (TOPAMAX) 100 MG tablet Take 100 mg by mouth at bedtime.  07/20/16  Yes [provider]  TRULICITY 7.56 EP/3.2RJ SOPN Inject 0.75 mg into the skin once a week. Patient takes on Wednesday. 08/29/15  Yes [provider]  Vitamin D, Ergocalciferol, (DRISDOL) 50000 units CAPS capsule Take 50,000 Units by mouth every 7 (seven) days.   Yes [provider]  VOLTAREN 1 % GEL Apply 1 application topically 3 (three) times daily as needed (for pain).  02/03/14  Yes [provider]  lidocaine (LIDODERM) 5 % Place 1 patch onto the skin daily. Remove & Discard patch within 12 hours or as  directed by MD Patient not taking: Reported on 08/22/2017 08/27/17   Petrucelli, Glynda Jaeger, PA-C  methocarbamol (ROBAXIN) 500 MG tablet Take 2 tablets (1,000 mg total) by mouth 4 (four) times daily as needed (Pain). Patient not taking: Reported on 08/29/2017 09/04/17   Pisciotta, Elmyra Ricks, PA-C  naproxen (NAPROSYN) 500 MG tablet Take 1 tablet (500 mg total) by mouth 2 (two) times daily. Patient not taking: Reported on 08/29/2017 08/27/17   Petrucelli, Glynda Jaeger, PA-C    Family History Family History  Problem Relation Age of Onset  . Diabetes Mother   . Cancer Mother        breast  . Aneurysm Father        brain  . Diabetes Sister   . Cancer Brother        bone marrow cancer    Social History Social History   Tobacco Use  . Smoking status: Former Smoker    Packs/day: 0.25    Years: 6.00    Pack years: 1.50    Types: Cigarettes    Last attempt to quit: 06/09/2013    Years since quitting: 4.2  . Smokeless tobacco: Never Used  Substance Use Topics  . Alcohol use: No  . Drug use: No     Allergies   Bactrim [sulfamethoxazole-trimethoprim]; Cefuroxime axetil; Cephalosporins; Iohexol; Lisinopril; Penicillins; and Sulfa antibiotics   Review of Systems Review of Systems 10 Systems reviewed and are negative for acute change except as noted in the HPI. Level 5 caveat review of systems difficult because patient is acutely ill.  Family members have assisted in review of systems.  Physical Exam Updated Vital Signs BP 92/61   Pulse 78   Temp 97.8 F (36.6 C) (Oral)   Resp 18   Ht 5\' 2"  (1.575 m)   Wt 117.9 kg (260 lb)   LMP 12/27/2013 Comment: irreggular  SpO2 100%   BMI  47.55 kg/m   Physical Exam  Constitutional:  Patient is ill in appearance.  She is moaning.  She is slightly diaphoretic.  Obese.  No acute respiratory distress.  HENT:  Head: Normocephalic and atraumatic.  Mucous membranes are dry.  Airway is protected.  Patient is moaning and verbalizing.  Eyes: Pupils  are equal, round, and reactive to light. Left eye exhibits no discharge. Scleral icterus is present.  Pupils are about 3 mm and reactive.  Extraocular motions are conjugate.  Patient does have scleral icterus.  Neck: Neck supple.  No frank lymphadenopathy.  No meningismus.  Cardiovascular:  Mild tachycardia.  Heart sounds are distant.  No gross rub murmur gallop.  Pulmonary/Chest:  Patient does not have apparent respiratory distress.  She is moaning at times.  Lungs are grossly clear.  She however has difficulty with respiratory effort at request for deep inspiration.  Abdominal:  Abdomen is soft.  No gross distention.  No guarding.  No maceration or Candida in skin folds.  Musculoskeletal:  Patient has a slightly swollen and faintly erythematous appearing effusion at the left wrist.  This seems to be exquisitely tender.  The hand is not generally swollen.  She appears to have a tophi at the left elbow but this is not warm or fluctuant.  Patient expresses a lot of pain with palpation and movement of the forearm and wrist.  Patient also has a wound of the right great toe.  Skin appears somewhat gray and toe is swollen.no Streaking up the foot.  See attached image.  Neurological:  Patient's level of alertness seems to wax and wane.  At times she is moaning and seems to have difficulty focusing.  Sometimes she answers questions appropriately.  She does move all 4 extremities without evident focal deficit or flaccid paralysis.  Skin:  Skin is warm.  She is periodically somewhat diaphoretic.             ED Treatments / Results  Labs (all labs ordered are listed, but only abnormal results are displayed) Labs Reviewed  COMPREHENSIVE METABOLIC PANEL - Abnormal; Notable for the following components:      Result Value   Sodium 132 (*)    Chloride 97 (*)    CO2 18 (*)    Glucose, Bld 337 (*)    BUN 69 (*)    Creatinine, Ser 3.36 (*)    Calcium 7.6 (*)    Albumin 2.0 (*)    AST 53 (*)     Alkaline Phosphatase 294 (*)    Total Bilirubin 4.1 (*)    GFR calc non Af Amer 14 (*)    GFR calc Af Amer 17 (*)    Anion gap 17 (*)    All other components within normal limits  CBC WITH DIFFERENTIAL/PLATELET - Abnormal; Notable for the following components:   WBC 25.1 (*)    RBC 3.82 (*)    Hemoglobin 7.6 (*)    HCT 23.3 (*)    MCV 61.0 (*)    MCH 19.9 (*)    RDW 18.1 (*)    Platelets 423 (*)    Neutro Abs 22.1 (*)    All other components within normal limits  PHOSPHORUS - Abnormal; Notable for the following components:   Phosphorus 5.9 (*)    All other components within normal limits  AMMONIA - Abnormal; Notable for the following components:   Ammonia 38 (*)    All other components within normal limits  CBG MONITORING, ED -  Abnormal; Notable for the following components:   Glucose-Capillary 313 (*)    All other components within normal limits  I-STAT VENOUS BLOOD GAS, ED - Abnormal; Notable for the following components:   pCO2, Ven 35.5 (*)    TCO2 21 (*)    Acid-base deficit 5.0 (*)    All other components within normal limits  I-STAT CG4 LACTIC ACID, ED - Abnormal; Notable for the following components:   Lactic Acid, Venous 2.58 (*)    All other components within normal limits  I-STAT CG4 LACTIC ACID, ED - Abnormal; Notable for the following components:   Lactic Acid, Venous 2.59 (*)    All other components within normal limits  CULTURE, BLOOD (ROUTINE X 2)  CULTURE, BLOOD (ROUTINE X 2)  URINE CULTURE  CK  MAGNESIUM  LIPASE, BLOOD  URINALYSIS, ROUTINE W REFLEX MICROSCOPIC  RAPID URINE DRUG SCREEN, HOSP PERFORMED  URIC ACID  TSH  SEDIMENTATION RATE  PROCALCITONIN  APTT  BASIC METABOLIC PANEL  LACTIC ACID, PLASMA  LACTIC ACID, PLASMA  LACTATE DEHYDROGENASE  HAPTOGLOBIN  RETICULOCYTES  HEPATIC FUNCTION PANEL  TECHNOLOGIST SMEAR REVIEW  SAVE SMEAR  VITAMIN B12  FOLATE  IRON AND TIBC  FERRITIN  I-STAT CG4 LACTIC ACID, ED  POC OCCULT BLOOD, ED  PREPARE  RBC (CROSSMATCH)  TYPE AND SCREEN    EKG EKG Interpretation  Date/Time:  Friday September 05 2017 09:47:01 EDT Ventricular Rate:  83 PR Interval:    QRS Duration: 89 QT Interval:  418 QTC Calculation: 492 R Axis:   23 Text Interpretation:  Sinus rhythm Low voltage, precordial leads Borderline prolonged QT interval normal. no change from previous Confirmed by Charlesetta Shanks [phone]) on 08/24/2017 10:30:12 AM   Radiology Dg Wrist Complete Left  Result Date: 08/23/2017 CLINICAL DATA:  Chronic low back pain.  Body aches. EXAM: LEFT WRIST - COMPLETE 3+ VIEW COMPARISON:  No recent. FINDINGS: Diffuse severe degenerative change left wrist. Carpal cysts noted most likely degenerative. Probable postsurgical changes of the trapezium. No evidence of acute fracture or dislocation. IMPRESSION: Diffuse severe degenerative change left wrist. Probable postsurgical changes of the trapezium. No acute abnormality identified. Electronically Signed   By: Marcello Moores  Register   On: 08/19/2017 10:54   Ct Head Wo Contrast  Result Date: 09/04/2017 CLINICAL DATA:  Altered level of consciousness. EXAM: CT HEAD WITHOUT CONTRAST TECHNIQUE: Contiguous axial images were obtained from the base of the skull through the vertex without intravenous contrast. COMPARISON:  01/17/2017 FINDINGS: Brain: No acute intracranial abnormality. Specifically, no hemorrhage, hydrocephalus, mass lesion, acute infarction, or significant intracranial injury. Vascular: No hyperdense vessel or unexpected calcification. Skull: No acute calvarial abnormality. Sinuses/Orbits: Visualized paranasal sinuses and mastoids clear. Orbital soft tissues unremarkable. Other: None IMPRESSION: Normal study. Electronically Signed   By: Rolm Baptise M.D.   On: 09/01/2017 10:32   Dg Chest Port 1 View  Result Date: 08/18/2017 CLINICAL DATA:  Chronic low back pain, BILATERAL arm pain, generalized body aches for several months, increased pain with movement and change in  position, history of type II diabetes mellitus, asthma, hypertension, rheumatoid arthritis, former smoker EXAM: PORTABLE CHEST 1 VIEW COMPARISON:  Portable exam 0859 hours compared to 11/30/2015 FINDINGS: Enlargement of cardiac silhouette. Mediastinal contours and pulmonary vascularity normal. Lungs grossly clear. No definite infiltrate, pleural effusion or pneumothorax. IMPRESSION: Enlargement of cardiac silhouette. No acute abnormalities. Electronically Signed   By: Lavonia Dana M.D.   On: 08/15/2017 09:34   Dg Foot Complete Right  Result Date: 08/12/2017 CLINICAL  DATA:  Right foot pain. Rule out osteomyelitis of great toe. EXAM: RIGHT FOOT COMPLETE - 3+ VIEW COMPARISON:  09/13/2016 FINDINGS: No bony destructive changes noted to suggest osteomyelitis. Degenerative changes at the great toe IP joint. Mild soft tissue swelling along the dorsum of the foot. IMPRESSION: No visible radiographic changes of osteomyelitis. No acute bony abnormality. Electronically Signed   By: Rolm Baptise M.D.   On: 08/17/2017 10:51    Procedures Procedures (including critical care time) CRITICAL CARE Performed by: Si Gaul   Total critical care time:60 minutes  Critical care time was exclusive of separately billable procedures and treating other patients.  Critical care was necessary to treat or prevent imminent or life-threatening deterioration.  Critical care was time spent personally by me on the following activities: development of treatment plan with patient and/or surrogate as well as nursing, discussions with consultants, evaluation of patient's response to treatment, examination of patient, obtaining history from patient or surrogate, ordering and performing treatments and interventions, ordering and review of laboratory studies, ordering and review of radiographic studies, pulse oximetry and re-evaluation of patient's condition. Medications Ordered in ED Medications  aztreonam (AZACTAM) 2 g in sodium  chloride 0.9 % 100 mL IVPB (0 g Intravenous Stopped 08/08/2017 1000)  vancomycin (VANCOCIN) 1,500 mg in sodium chloride 0.9 % 500 mL IVPB (has no administration in time range)  0.9 %  sodium chloride infusion ( Intravenous New Bag/Given 09/01/2017 1208)  insulin aspart (novoLOG) injection 0-15 Units (has no administration in time range)  lactated ringers bolus 1,000 mL (0 mLs Intravenous Stopped 08/16/2017 0900)  sodium chloride 0.9 % bolus 1,000 mL (0 mLs Intravenous Stopped 08/17/2017 1152)    And  sodium chloride 0.9 % bolus 1,000 mL (0 mLs Intravenous Stopped 09/04/2017 1152)    And  sodium chloride 0.9 % bolus 1,000 mL (0 mLs Intravenous Stopped 08/13/2017 1222)    And  sodium chloride 0.9 % bolus 500 mL (0 mLs Intravenous Stopped 08/18/2017 1244)  0.9 %  sodium chloride infusion (10 mL/hr Intravenous New Bag/Given 08/15/2017 1158)  HYDROmorphone (DILAUDID) injection 1 mg (1 mg Intravenous Given 08/22/2017 0853)  vancomycin (VANCOCIN) 2,000 mg in sodium chloride 0.9 % 500 mL IVPB (2,000 mg Intravenous New Bag/Given 08/25/2017 0936)  metroNIDAZOLE (FLAGYL) IVPB 500 mg (0 mg Intravenous Stopped 08/14/2017 1035)     Initial Impression / Assessment and Plan / ED Course  I have reviewed the triage vital signs and the nursing notes.  Pertinent labs & imaging results that were available during my care of the patient were reviewed by me and considered in my medical decision making (see chart for details).    Patient had an episode of appearance of seizure and hypoxia.  I was called to the room to assess.  This seemed to wax and wane.  Patient would stare and become somewhat apneic.  She would then rouse and seemed startled.  Her oxygen saturation would immediately recover.  She was observed to this.  And this seemed to resolve as the patient became resuscitated with fluids and oxygen.  Reportedly this started after she was given Dilaudid for pain control.  Possibly this was apnea associated with narcotics however patient would  likely be fairly narcotic tolerant and has not had issues previously.  I would not say this appeared typical for oversedation with narcotics and has more of a seizure or encephalopathic appearance.  At this time however no other specific etiology identified.  11:19 reviewed with Dr. Lamonte Sakai.  Intensivist  will see her consult in the emergency department.  After consultation, no further suggestions at this time on management of acute anemia with increased total bilirubin and concern for possible hemolytic anemia.  Advice is to continue with rehydration for acute renal failure and suspected sepsis.  Commended admission to hospitalist service.  Consultation with the hospitalist for admission.  Consults were placed for hematology to review possibility of hemolytic anemia.  At this time I have not had return contact to assist in management.  Currently do not suspect TPP based on patient not being thrombocytopenic. Final Clinical Impressions(s) / ED Diagnoses   Final diagnoses:  Encephalopathy  Acute renal failure, unspecified acute renal failure type ([place])  Hyperbilirubinemia  Acute anemia   Patient presents with complex medical problems as outlined above.  She does have chronic pain with known rheumatoid arthritis on multiple medications.  Pain has been becoming worse over the past 2 weeks with 2 visits to the emergency department.  She became acutely and severely ill starting about 2 days ago by family report.  She has multiple derangements of her labs.  She has acute renal failure, acute elevation in total bili without elevation in AST ALT, acute anemia without apparent blood loss, significant leukocytosis and site for potential infection.  Management is started for sepsis and dehydration.  Consultations have made to critical care and hospitalist for admission.  Consultations were requested for hematology but to this point have not been completed.  During the course of resuscitation, patient's general  clinical appearance is improving. ED Discharge Orders    None       Charlesetta Shanks, MD 08/20/2017 1309

## 2017-09-06 ENCOUNTER — Other Ambulatory Visit: Payer: Self-pay | Admitting: Hematology

## 2017-09-06 ENCOUNTER — Encounter (HOSPITAL_COMMUNITY): Payer: Self-pay

## 2017-09-06 DIAGNOSIS — E119 Type 2 diabetes mellitus without complications: Secondary | ICD-10-CM

## 2017-09-06 DIAGNOSIS — Z888 Allergy status to other drugs, medicaments and biological substances status: Secondary | ICD-10-CM

## 2017-09-06 DIAGNOSIS — R6520 Severe sepsis without septic shock: Secondary | ICD-10-CM

## 2017-09-06 DIAGNOSIS — A4101 Sepsis due to Methicillin susceptible Staphylococcus aureus: Principal | ICD-10-CM

## 2017-09-06 DIAGNOSIS — M069 Rheumatoid arthritis, unspecified: Secondary | ICD-10-CM

## 2017-09-06 DIAGNOSIS — M545 Low back pain: Secondary | ICD-10-CM

## 2017-09-06 DIAGNOSIS — N179 Acute kidney failure, unspecified: Secondary | ICD-10-CM

## 2017-09-06 DIAGNOSIS — Z87891 Personal history of nicotine dependence: Secondary | ICD-10-CM

## 2017-09-06 DIAGNOSIS — Z880 Allergy status to penicillin: Secondary | ICD-10-CM

## 2017-09-06 DIAGNOSIS — Z882 Allergy status to sulfonamides status: Secondary | ICD-10-CM

## 2017-09-06 DIAGNOSIS — K7200 Acute and subacute hepatic failure without coma: Secondary | ICD-10-CM

## 2017-09-06 DIAGNOSIS — M7989 Other specified soft tissue disorders: Secondary | ICD-10-CM

## 2017-09-06 DIAGNOSIS — R748 Abnormal levels of other serum enzymes: Secondary | ICD-10-CM | POA: Diagnosis present

## 2017-09-06 DIAGNOSIS — Z794 Long term (current) use of insulin: Secondary | ICD-10-CM

## 2017-09-06 DIAGNOSIS — Z883 Allergy status to other anti-infective agents status: Secondary | ICD-10-CM

## 2017-09-06 DIAGNOSIS — R7881 Bacteremia: Secondary | ICD-10-CM

## 2017-09-06 DIAGNOSIS — Z881 Allergy status to other antibiotic agents status: Secondary | ICD-10-CM

## 2017-09-06 DIAGNOSIS — H9123 Sudden idiopathic hearing loss, bilateral: Secondary | ICD-10-CM

## 2017-09-06 DIAGNOSIS — D509 Iron deficiency anemia, unspecified: Secondary | ICD-10-CM

## 2017-09-06 LAB — TYPE AND SCREEN
ABO/RH(D): A POS
Antibody Screen: NEGATIVE
DONOR AG TYPE: NEGATIVE
Donor AG Type: NEGATIVE
PT AG TYPE: NEGATIVE
Unit division: 0
Unit division: 0

## 2017-09-06 LAB — MRSA PCR SCREENING: MRSA by PCR: NEGATIVE

## 2017-09-06 LAB — HIV ANTIBODY (ROUTINE TESTING W REFLEX): HIV Screen 4th Generation wRfx: NONREACTIVE

## 2017-09-06 LAB — BLOOD CULTURE ID PANEL (REFLEXED)
Acinetobacter baumannii: NOT DETECTED
CANDIDA GLABRATA: NOT DETECTED
CANDIDA KRUSEI: NOT DETECTED
Candida albicans: NOT DETECTED
Candida parapsilosis: NOT DETECTED
Candida tropicalis: NOT DETECTED
ENTEROBACTER CLOACAE COMPLEX: NOT DETECTED
Enterobacteriaceae species: NOT DETECTED
Enterococcus species: NOT DETECTED
Escherichia coli: NOT DETECTED
Haemophilus influenzae: NOT DETECTED
KLEBSIELLA PNEUMONIAE: NOT DETECTED
Klebsiella oxytoca: NOT DETECTED
Listeria monocytogenes: NOT DETECTED
Methicillin resistance: NOT DETECTED
NEISSERIA MENINGITIDIS: NOT DETECTED
PROTEUS SPECIES: NOT DETECTED
Pseudomonas aeruginosa: NOT DETECTED
STAPHYLOCOCCUS SPECIES: DETECTED — AB
STREPTOCOCCUS SPECIES: NOT DETECTED
Serratia marcescens: NOT DETECTED
Staphylococcus aureus (BCID): DETECTED — AB
Streptococcus agalactiae: NOT DETECTED
Streptococcus pneumoniae: NOT DETECTED
Streptococcus pyogenes: NOT DETECTED

## 2017-09-06 LAB — GLUCOSE, CAPILLARY
GLUCOSE-CAPILLARY: 164 mg/dL — AB (ref 65–99)
GLUCOSE-CAPILLARY: 160 mg/dL — AB (ref 65–99)
GLUCOSE-CAPILLARY: 169 mg/dL — AB (ref 65–99)
GLUCOSE-CAPILLARY: 129 mg/dL — AB (ref 65–99)
Glucose-Capillary: 145 mg/dL — ABNORMAL HIGH (ref 65–99)

## 2017-09-06 LAB — CBC
HCT: 27.5 % — ABNORMAL LOW (ref 36.0–46.0)
Hemoglobin: 8.9 g/dL — ABNORMAL LOW (ref 12.0–15.0)
MCH: 21 pg — AB (ref 26.0–34.0)
MCHC: 32.4 g/dL (ref 30.0–36.0)
MCV: 65 fL — AB (ref 78.0–100.0)
PLATELETS: 439 10*3/uL — AB (ref 150–400)
RBC: 4.23 MIL/uL (ref 3.87–5.11)
RDW: 21.4 % — AB (ref 11.5–15.5)
WBC: 24 10*3/uL — AB (ref 4.0–10.5)

## 2017-09-06 LAB — COMPREHENSIVE METABOLIC PANEL
ALT: 32 U/L (ref 14–54)
AST: 89 U/L — AB (ref 15–41)
Albumin: 2 g/dL — ABNORMAL LOW (ref 3.5–5.0)
Alkaline Phosphatase: 343 U/L — ABNORMAL HIGH (ref 38–126)
Anion gap: 16 — ABNORMAL HIGH (ref 5–15)
BUN: 74 mg/dL — ABNORMAL HIGH (ref 6–20)
CHLORIDE: 105 mmol/L (ref 101–111)
CO2: 16 mmol/L — AB (ref 22–32)
Calcium: 7.1 mg/dL — ABNORMAL LOW (ref 8.9–10.3)
Creatinine, Ser: 4.34 mg/dL — ABNORMAL HIGH (ref 0.44–1.00)
GFR, EST AFRICAN AMERICAN: 12 mL/min — AB (ref >60)
GFR, EST NON AFRICAN AMERICAN: 11 mL/min — AB (ref >60)
Glucose, Bld: 190 mg/dL — ABNORMAL HIGH (ref 65–99)
POTASSIUM: 5.6 mmol/L — AB (ref 3.5–5.1)
Sodium: 137 mmol/L (ref 135–145)
Total Bilirubin: 5.9 mg/dL — ABNORMAL HIGH (ref 0.3–1.2)
Total Protein: 6.8 g/dL (ref 6.5–8.1)

## 2017-09-06 LAB — BPAM RBC
Blood Product Expiration Date: 201904042359
Blood Product Expiration Date: 201904042359
ISSUE DATE / TIME: 201903291132
ISSUE DATE / TIME: 201903291517
UNIT TYPE AND RH: 6200
Unit Type and Rh: 6200

## 2017-09-06 LAB — DIRECT ANTIGLOBULIN TEST (NOT AT ARMC)
DAT, IgG: NEGATIVE
DAT, complement: NEGATIVE

## 2017-09-06 LAB — CBG MONITORING, ED
GLUCOSE-CAPILLARY: 213 mg/dL — AB (ref 65–99)
GLUCOSE-CAPILLARY: 238 mg/dL — AB (ref 65–99)

## 2017-09-06 LAB — AMMONIA: AMMONIA: 101 umol/L — AB (ref 9–35)

## 2017-09-06 LAB — HAPTOGLOBIN: Haptoglobin: 410 mg/dL — ABNORMAL HIGH (ref 34–200)

## 2017-09-06 LAB — PROCALCITONIN: Procalcitonin: 18.4 ng/mL

## 2017-09-06 LAB — CK: CK TOTAL: 233 U/L (ref 38–234)

## 2017-09-06 MED ORDER — INSULIN ASPART 100 UNIT/ML ~~LOC~~ SOLN
0.0000 [IU] | Freq: Three times a day (TID) | SUBCUTANEOUS | Status: DC
  Administered 2017-09-06 – 2017-09-09 (×5): 2 [IU] via SUBCUTANEOUS
  Administered 2017-09-10: 5 [IU] via SUBCUTANEOUS
  Administered 2017-09-10 – 2017-09-11 (×3): 3 [IU] via SUBCUTANEOUS

## 2017-09-06 MED ORDER — ORAL CARE MOUTH RINSE
15.0000 mL | Freq: Two times a day (BID) | OROMUCOSAL | Status: DC
  Administered 2017-09-06 – 2017-09-11 (×11): 15 mL via OROMUCOSAL

## 2017-09-06 MED ORDER — SODIUM POLYSTYRENE SULFONATE 15 GM/60ML PO SUSP: 15.0000 g | Freq: Once | ORAL | Status: DC

## 2017-09-06 MED ORDER — DEXTROSE 5 % IV SOLN
0.5000 g | Freq: Three times a day (TID) | INTRAVENOUS | Status: DC
  Administered 2017-09-06: 0.5 g via INTRAVENOUS
  Filled 2017-09-06: qty 0.5

## 2017-09-06 MED ORDER — SODIUM CHLORIDE 0.9 % IV SOLN
8.0000 mg/kg | INTRAVENOUS | Status: DC
  Administered 2017-09-06: 984 mg via INTRAVENOUS
  Filled 2017-09-06: qty 19.68

## 2017-09-06 MED ORDER — SODIUM POLYSTYRENE SULFONATE 15 GM/60ML PO SUSP
30.0000 g | Freq: Once | ORAL | Status: AC
  Administered 2017-09-06: 30 g via ORAL
  Filled 2017-09-06: qty 120

## 2017-09-06 NOTE — ED Notes (Signed)
Pulses still present in L foot, found with doppler. Remain dusky, cap refill approx 4-5 seconds

## 2017-09-06 NOTE — Progress Notes (Addendum)
PROGRESS NOTE    Jessica Howard  UMP:[id number] DOB: Dec 08, 1960 DOA: 09/02/2017 PCP: Merrilee Seashore, MD    Brief Narrative: 57 y.o. female with a Past Medical History significant for rheumatoid arthritis, hypertension, diabetes who presents with back pain and confusion.  Presenting with sepsis and bacteremia with MSSA Staphylococcus aureus.   Assessment & Plan:   Principal Problem:   Bacteremia due to methicillin susceptible Staphylococcus aureus (MSSA) - Discussed with pharmacy and plan will be to continue vancomycin.  UTI - We'll await urine culture to continue aztreonam until results available. Patient has many allergies to antibiotics including penicillins and cephalosporins    Symptomatic anemia - Most likely secondary to infectious etiology given bacteremia. Patient does not have labs consistent with hemolysis. Hematologist has assisted and I would like to thank her for her help. - Hemoglobin 8.9 should hemoglobin drops below 8 we'll plan on transfusing - reassess cbc next am.  Hyperkalemia - Pt is s/p kayexalate 3/30  Active Problems:   Thalassemia   Rheumatoid arthritis ([place]) - hold immunomodulating medications while patient has active infection    Morbid obesity ([place])   Essential hypertension   Diabetes ([place]) - place on SSI - continue diabetic diet    Dyslipidemia   Sepsis ([place])   Acute kidney injury ([place] ) - Most likely related to sepsis - continue to monitor serum creatinine - should improve with improvement in blood pressures, resolution of sepsis, and improved oral intake.    Immunosuppression due to drug therapy   Rheumatoid arthritis(714.0)   Acute pain of left wrist/Great toe pain, right - question whether source of infection. Continue antibiotic therapy    Hypertension - Patient has been having soft blood pressures secondary to infectious etiology. Currently not on antihypertensive regimen    Hyperlipidemia   Acute hearing loss of left  ear   Beta thalassemia ([facility])    elevated liver enzymes - Most likely secondary to sepsis. Will reassess CMP tomorrow    Hyperbilirubinemia   DVT prophylaxis: Lovenox Code Status: Full Family Communication: Discussed with patient and family at bedside Disposition Plan: Pending improvement in condition   Consultants:   Discussed with oncology   Procedures: None   Antimicrobials: vancomycin and aztreonam per my discussion with pharmacy   Subjective:  The patient has no new complaints currently. She would like to have ice. At the time I saw her patient was nothing by mouth.  Objective: Vitals:   09/06/17 1100 09/06/17 1151 09/06/17 1205 09/06/17 1300  BP: (!) 107/52  (!) 93/54 104/64  Pulse: 84  83 83  Resp: 17  (!) 24 (!) 22  Temp:  98.1 F (36.7 C)    TempSrc:  Oral    SpO2: 100%  98% 97%  Weight:      Height:        Intake/Output Summary (Last 24 hours) at 09/06/2017 1312 Last data filed at 09/06/2017 1300 Gross per 24 hour  Intake 4030 ml  Output 55 ml  Net 3975 ml   Filed Weights   08/15/2017 0900 09/06/17 0500  Weight: 117.9 kg (260 lb) 123 kg (271 lb 2.7 oz)    Examination:  General exam: Appears calm and comfortable, in no acute distress  Respiratory system: Clear to auscultation. Respiratory effort normal. Equal chest rise Cardiovascular system: S1 & S2 heard, RRR. No JVD, murmurs, rubs Gastrointestinal system: Abdomen is nondistended, soft and nontender. No organomegaly or masses felt. Normal bowel sounds heard. Central nervous system: Alert and oriented. No focal  neurological deficits. Extremities: Warm, positive pulses Skin: No rashes, lesions or ulcers, on limited exam Psychiatry:  Mood & affect appropriate.     Data Reviewed: I have personally reviewed following labs and imaging studies  CBC: Recent Labs  Lab 08/28/2017 0632 09/06/17 0310  WBC 25.1* 24.0*  NEUTROABS 22.1*  --   HGB 7.6* 8.9*  HCT 23.3* 27.5*  MCV 61.0* 65.0*  PLT  423* 027*   Basic Metabolic Panel: Recent Labs  Lab 08/15/2017 0632 09/07/2017 1531 08/20/2017 2001 09/06/17 0310  NA 132* 136 135 137  K 5.1 5.6* 6.0* 5.6*  CL 97* 103 104 105  CO2 18* 18* 15* 16*  GLUCOSE 337* 298* 275* 190*  BUN 69* 69* 69* 74*  CREATININE 3.36* 3.75* 3.91* 4.34*  CALCIUM 7.6* 7.0* 7.0* 7.1*  MG 2.3  --   --   --   PHOS 5.9*  --   --   --    GFR: Estimated Creatinine Clearance: 18.3 mL/min (A) (by C-G formula based on SCr of 4.34 mg/dL (H)). Liver Function Tests: Recent Labs  Lab 09/04/2017 0632 08/24/2017 1531 08/25/2017 2001 09/06/17 0310  AST 53* 81* 87* 89*  ALT 24 32 34 32  ALKPHOS 294* 335* 354* 343*  BILITOT 4.1* 5.4* 6.2* 5.9*  PROT 6.5 6.4* 6.8 6.8  ALBUMIN 2.0* 1.8* 2.1* 2.0*   Recent Labs  Lab 08/27/2017 0815  LIPASE 28   Recent Labs  Lab 08/21/2017 0956 09/06/17 0310  AMMONIA 38* 101*   Coagulation Profile: Recent Labs  Lab 08/30/2017 1531  INR 1.45   Cardiac Enzymes: Recent Labs  Lab 08/30/2017 0632  CKTOTAL 55   BNP (last 3 results) No results for input(s): PROBNP in the last 8760 hours. HbA1C: No results for input(s): HGBA1C in the last 72 hours. CBG: Recent Labs  Lab 09/06/17 0026 09/06/17 0338 09/06/17 0523 09/06/17 0722 09/06/17 1150  GLUCAP 238* 213* 164* 145* 160*   Lipid Profile: No results for input(s): CHOL, HDL, LDLCALC, TRIG, CHOLHDL, LDLDIRECT in the last 72 hours. Thyroid Function Tests: Recent Labs    08/27/2017 1531  TSH 0.822   Anemia Panel: Recent Labs    08/29/2017 1531  VITAMINB12 429  FOLATE 6.0  FERRITIN 4,025*  TIBC 136*  IRON 33  RETICCTPCT 0.7   Sepsis Labs: Recent Labs  Lab 08/22/2017 0806 08/12/2017 1006 08/28/2017 1526 08/15/2017 1531 09/01/2017 2001 09/06/17 0310  PROCALCITON  --   --   --  11.11  --  18.40  LATICACIDVEN 2.58* 2.59* 2.1*  --  1.9  --     Recent Results (from the past 240 hour(s))  Blood Culture (routine x 2)     Status: None (Preliminary result)   Collection Time:  08/14/2017  9:50 AM  Result Value Ref Range Status   Specimen Description BLOOD RIGHT HAND  Final   Special Requests   Final    BOTTLES DRAWN AEROBIC AND ANAEROBIC Blood Culture adequate volume   Culture  Setup Time   Final    GRAM POSITIVE COCCI IN BOTH AEROBIC AND ANAEROBIC BOTTLES CRITICAL RESULT CALLED TO, READ BACK BY AND VERIFIED WITH: G ABBOTT 09/06/17 0006 JDW    Culture   Final    GRAM POSITIVE COCCI CULTURE REINCUBATED FOR BETTER GROWTH Performed at [facility], [place] [address] [place], [place] [address]    Report Status PENDING  Incomplete  Blood Culture (routine x 2)     Status: None (Preliminary result)   Collection  Time: 08/22/2017  1:54 PM  Result Value Ref Range Status   Specimen Description BLOOD RIGHT FOREARM  Final   Special Requests   Final    BOTTLES DRAWN AEROBIC AND ANAEROBIC Blood Culture adequate volume   Culture  Setup Time   Final    GRAM POSITIVE COCCI IN BOTH AEROBIC AND ANAEROBIC BOTTLES CRITICAL RESULT CALLED TO, READ BACK BY AND VERIFIED WITH: G ABBOTT PHARMD 09/06/17 0006 JDW    Culture   Final    GRAM POSITIVE COCCI CULTURE REINCUBATED FOR BETTER GROWTH Performed at [facility], [place] [address] [place], [place] [address]    Report Status PENDING  Incomplete  Blood Culture ID Panel (Reflexed)     Status: Abnormal   Collection Time: 08/15/2017  1:54 PM  Result Value Ref Range Status   Enterococcus species NOT DETECTED NOT DETECTED Final   Listeria monocytogenes NOT DETECTED NOT DETECTED Final   Staphylococcus species DETECTED (A) NOT DETECTED Final    Comment: CRITICAL RESULT CALLED TO, READ BACK BY AND VERIFIED WITH: G ABBOTT PHARMD 09/06/17 0006 JDW    Staphylococcus aureus DETECTED (A) NOT DETECTED Final    Comment: Methicillin (oxacillin) susceptible Staphylococcus aureus (MSSA). Preferred therapy is anti staphylococcal beta lactam antibiotic (Cefazolin or Nafcillin), unless clinically contraindicated. CRITICAL RESULT CALLED TO,  READ BACK BY AND VERIFIED WITH: G ABBOTT PHARMD 09/06/17 0006 JDW    Methicillin resistance NOT DETECTED NOT DETECTED Final   Streptococcus species NOT DETECTED NOT DETECTED Final   Streptococcus agalactiae NOT DETECTED NOT DETECTED Final   Streptococcus pneumoniae NOT DETECTED NOT DETECTED Final   Streptococcus pyogenes NOT DETECTED NOT DETECTED Final   Acinetobacter baumannii NOT DETECTED NOT DETECTED Final   Enterobacteriaceae species NOT DETECTED NOT DETECTED Final   Enterobacter cloacae complex NOT DETECTED NOT DETECTED Final   Escherichia coli NOT DETECTED NOT DETECTED Final   Klebsiella oxytoca NOT DETECTED NOT DETECTED Final   Klebsiella pneumoniae NOT DETECTED NOT DETECTED Final   Proteus species NOT DETECTED NOT DETECTED Final   Serratia marcescens NOT DETECTED NOT DETECTED Final   Haemophilus influenzae NOT DETECTED NOT DETECTED Final   Neisseria meningitidis NOT DETECTED NOT DETECTED Final   Pseudomonas aeruginosa NOT DETECTED NOT DETECTED Final   Candida albicans NOT DETECTED NOT DETECTED Final   Candida glabrata NOT DETECTED NOT DETECTED Final   Candida krusei NOT DETECTED NOT DETECTED Final   Candida parapsilosis NOT DETECTED NOT DETECTED Final   Candida tropicalis NOT DETECTED NOT DETECTED Final    Comment: Performed at [facility], [place] [address] [place], [place] [address]  Urine culture     Status: Abnormal (Preliminary result)   Collection Time: 08/30/2017  3:56 PM  Result Value Ref Range Status   Specimen Description URINE, RANDOM  Final   Special Requests NONE  Final   Culture (A)  Final    50,000 COLONIES/mL STAPHYLOCOCCUS AUREUS SUSCEPTIBILITIES TO FOLLOW Performed at [facility], [place] [address] [place], [place] [address]    Report Status PENDING  Incomplete  MRSA PCR Screening     Status: None   Collection Time: 09/06/17  5:20 AM  Result Value Ref Range Status   MRSA by PCR NEGATIVE NEGATIVE Final    Comment:        The GeneXpert MRSA  Assay (FDA approved for NASAL specimens only), is one component of a comprehensive MRSA colonization surveillance program. It is not intended to diagnose MRSA infection nor to guide or  monitor treatment for MRSA infections. Performed at [facility], [place] [address], [place], [place] [address]      Radiology Studies: Dg Wrist Complete Left  Result Date: 09/03/2017 CLINICAL DATA:  Chronic low back pain.  Body aches. EXAM: LEFT WRIST - COMPLETE 3+ VIEW COMPARISON:  No recent. FINDINGS: Diffuse severe degenerative change left wrist. Carpal cysts noted most likely degenerative. Probable postsurgical changes of the trapezium. No evidence of acute fracture or dislocation. IMPRESSION: Diffuse severe degenerative change left wrist. Probable postsurgical changes of the trapezium. No acute abnormality identified. Electronically Signed   By: Marcello Moores  Register   On: 08/15/2017 10:54   Ct Head Wo Contrast  Result Date: 08/08/2017 CLINICAL DATA:  Altered level of consciousness. EXAM: CT HEAD WITHOUT CONTRAST TECHNIQUE: Contiguous axial images were obtained from the base of the skull through the vertex without intravenous contrast. COMPARISON:  01/17/2017 FINDINGS: Brain: No acute intracranial abnormality. Specifically, no hemorrhage, hydrocephalus, mass lesion, acute infarction, or significant intracranial injury. Vascular: No hyperdense vessel or unexpected calcification. Skull: No acute calvarial abnormality. Sinuses/Orbits: Visualized paranasal sinuses and mastoids clear. Orbital soft tissues unremarkable. Other: None IMPRESSION: Normal study. Electronically Signed   By: Rolm Baptise M.D.   On: 08/19/2017 10:32   US Abdomen Complete  Result Date: 08/29/2017 CLINICAL DATA:  Elevated liver enzymes EXAM: ABDOMEN ULTRASOUND COMPLETE COMPARISON:  None. FINDINGS: Gallbladder: No gallstones or wall thickening visualized. No pericholecystic fluid. No sonographic Murphy sign noted by sonographer. Common bile  duct: Diameter: 2 mm. No intrahepatic, common hepatic, or common bile duct dilatation. Liver: No focal lesion identified. Within normal limits in parenchymal echogenicity. Portal vein is patent on color Doppler imaging with normal direction of blood flow towards the liver. IVC: No abnormality visualized. Pancreas: No pancreatic mass or inflammatory focus. Spleen: Size and appearance within normal limits. Right Kidney: Length: 13.5 cm. Echogenicity within normal limits. No mass or hydronephrosis visualized. Left Kidney: Length: 12.1 cm. Echogenicity within normal limits. No mass or hydronephrosis visualized. Abdominal aorta: No aneurysm visualized. There is aortic atherosclerosis. Other findings: No demonstrable ascites. IMPRESSION: Aortic atherosclerosis.  Study otherwise unremarkable. Aortic Atherosclerosis (ICD10-I70.0). Electronically Signed   By: Lowella Grip III M.D.   On: 08/16/2017 15:06   US Pelvis Limited (transabdominal Only)  Result Date: 08/09/2017 CLINICAL DATA:  Acute kidney injury EXAM: LIMITED ULTRASOUND OF PELVIS TECHNIQUE: Limited transabdominal ultrasound examination of the pelvis was performed. COMPARISON:  Ultrasound 11/20/2010 FINDINGS: Limited sonogram of the pelvis. Foley catheter in the pelvis. The urinary bladder is empty. IMPRESSION: Foley catheter in the pelvis.  Urinary bladder is empty. Electronically Signed   By: Donavan Foil M.D.   On: 08/12/2017 15:08   Dg Chest Port 1 View  Result Date: 08/10/2017 CLINICAL DATA:  Chronic low back pain, BILATERAL arm pain, generalized body aches for several months, increased pain with movement and change in position, history of type II diabetes mellitus, asthma, hypertension, rheumatoid arthritis, former smoker EXAM: PORTABLE CHEST 1 VIEW COMPARISON:  Portable exam 0859 hours compared to 11/30/2015 FINDINGS: Enlargement of cardiac silhouette. Mediastinal contours and pulmonary vascularity normal. Lungs grossly clear. No definite  infiltrate, pleural effusion or pneumothorax. IMPRESSION: Enlargement of cardiac silhouette. No acute abnormalities. Electronically Signed   By: Lavonia Dana M.D.   On: 08/19/2017 09:34   Dg Foot Complete Right  Result Date: 08/31/2017 CLINICAL DATA:  Right foot pain. Rule out osteomyelitis of great toe. EXAM: RIGHT FOOT COMPLETE - 3+ VIEW COMPARISON:  09/13/2016 FINDINGS: No bony destructive  changes noted to suggest osteomyelitis. Degenerative changes at the great toe IP joint. Mild soft tissue swelling along the dorsum of the foot. IMPRESSION: No visible radiographic changes of osteomyelitis. No acute bony abnormality. Electronically Signed   By: Rolm Baptise M.D.   On: 08/26/2017 10:51   Scheduled Meds: . enoxaparin (LOVENOX) injection  30 mg Subcutaneous Q24H  . insulin aspart  0-15 Units Subcutaneous TID WC  . mouth rinse  15 mL Mouth Rinse BID  . sodium chloride flush  3 mL Intravenous Q12H   Continuous Infusions: . sodium chloride 125 mL/hr at 09/06/17 0354     LOS: 1 day    Time spent:  35 minutes  Velvet Bathe, MD [facility] Pager [phone]  If 7PM-7AM, please contact night-coverage [email or web address] Password [email or web address] 09/06/2017, 1:12 PM

## 2017-09-06 NOTE — Consult Note (Signed)
[facility]    Date of Admission:  08/17/2017           Day 1 vancomycin       Reason for Consult: Automatic consultation for staph aureus bacteremia     Assessment: She has MSSA bacteremia with sepsis causing acute kidney and liver injury.  I strongly suspect that she has lumbar infection and may have infection of her left wrist and right great toe.  She has a history of beta-lactam allergy including cephalosporins.  Given her renal insufficiency I will change vancomycin to daptomycin.  I would obtain an MRI of her lumbar spine within the next few days.  I also recommend orthopedic evaluation.  I am not sure how to explain her hearing loss.  Plan: 1. Change vancomycin to daptomycin 2. Repeat blood cultures in a.m. 3. Transthoracic echocardiogram 4. Lumbar MRI in the next 2-3 days 5. Recommend orthopedic evaluation  Principal Problem:   Bacteremia due to methicillin susceptible Staphylococcus aureus (MSSA) Active Problems:   Acute back pain   Sepsis ([place])   Acute kidney injury ([place])   Acute pain of left wrist   Great toe pain, right   Acute hearing loss of left ear   Elevated liver enzymes   Hyperbilirubinemia   Thalassemia   Rheumatoid arthritis ([place])   Morbid obesity (HCC)   Essential hypertension   Diabetes ([place])   Dyslipidemia   Immunosuppression due to drug therapy   Rheumatoid arthritis(714.0)   Hypertension   Hyperlipidemia   Diabetes mellitus, insulin dependent (IDDM), controlled (HCC)   Beta thalassemia (HCC)   Symptomatic anemia   Scheduled Meds: . enoxaparin (LOVENOX) injection  30 mg Subcutaneous Q24H  . insulin aspart  0-15 Units Subcutaneous TID WC  . mouth rinse  15 mL Mouth Rinse BID  . sodium chloride flush  3 mL Intravenous Q12H   Continuous Infusions: . sodium chloride 125 mL/hr at 09/06/17 0354   PRN Meds:.acetaminophen **OR** acetaminophen, morphine injection, ondansetron **OR** ondansetron (ZOFRAN)  IV  HPI: Jessica Howard is a 57 y.o. female with diabetes and rheumatoid arthritis who had sudden onset of severe low back pain about 2 weeks ago.  She was seen in the ED on 2 occasions and also by her primary care provider and treated with Flexeril.  Her pain was constant, progressive and severe.  She became confused and fell leading to admission yesterday.  She is also had painful swelling of her left wrist and right great toe.  She also had sudden loss of hearing in her left ear.  She developed sudden sensorineural hearing loss in her right ear last July.  Upon admission she was afebrile but had a white cell count of 25,100.  She has acute kidney injury, acute on chronic microcytic anemia, elevated liver enzymes and hyperbilirubinemia.  Chest x-ray showed only cardiomegaly.  Abdominal ultrasound was unremarkable.  There were no acute abnormalities noted on his CT scan.  Both admission blood cultures are growing MSSA.   Review of Systems: Review of Systems  Unable to perform ROS: Mental acuity    Past Medical History:  Diagnosis Date  . Asthma   . Beta thalassemia w/chronic anemia    Thalassemia  . Diabetes mellitus type 2, insulin dependent (HCC)    type 2  . Diabetic neuropathy ([place])   . Endometrial polyp   . Fibroadenoma of breast    left  . GERD (gastroesophageal reflux  disease)   . Headache    migraines  . Hearing loss, right   . Hepatic steatosis   . Hyperlipidemia   . Hypertension   . Immunocompromised state due to drug therapy   . Impingement syndrome of right shoulder   . Nontraumatic tear of right rotator cuff   . Pneumonia   . Psoriasis   . Rheumatoid arthritis(714.0)    rhematoid and osteoarthritis  . Sleep apnea   . Thyroid disease    had radiation    Social History   Tobacco Use  . Smoking status: Former Smoker    Packs/day: 0.25    Years: 6.00    Pack years: 1.50    Types: Cigarettes    Last attempt to quit: 06/09/2013    Years since quitting: 4.2    . Smokeless tobacco: Never Used  Substance Use Topics  . Alcohol use: No  . Drug use: No    Family History  Problem Relation Age of Onset  . Diabetes Mother   . Cancer Mother        breast  . Aneurysm Father        brain  . Diabetes Sister   . Cancer Brother        bone marrow cancer   Allergies  Allergen Reactions  . Bactrim [Sulfamethoxazole-Trimethoprim] Hives  . Cefuroxime Axetil Itching  . Cephalosporins Itching  . Iohexol Itching and Rash     Code: RASH, Desc: HAD ITCHING AND A RASH ABOUT ONE HOUR AFTER RETURNING HOME FROM THE CT, Onset Date: [id number]   . Lisinopril Cough  . Penicillins Hives  . Sulfa Antibiotics Hives    OBJECTIVE: Blood pressure 104/64, pulse 83, temperature 98.1 F (36.7 C), temperature source Oral, resp. rate (!) 22, height 5\' 2"  (1.575 m), weight 271 lb 2.7 oz (123 kg), last menstrual period 12/27/2013, SpO2 97 %.  Physical Exam  Constitutional:  She is alert but confused.  Her sister is at the bedside.  HENT:  Mouth/Throat: No oropharyngeal exudate.  She still has some intact hearing.  Eyes: Conjunctivae are normal.  Cardiovascular: Normal rate and regular rhythm.  No murmur heard. Pulmonary/Chest: Effort normal. She has no wheezes. She has no rales.  Abdominal: Soft. She exhibits no distension and no mass. There is no tenderness.  Musculoskeletal: She exhibits edema and tenderness.  Her right great toe is swollen and boggy.  The nail is missing.  She has diffuse pain and swelling of her left wrist and hand.  There is no erythema but it is slightly warm to touch.  Neurological: She is alert.  Skin: No rash noted.    Lab Results Lab Results  Component Value Date   WBC 24.0 (H) 09/06/2017   HGB 8.9 (L) 09/06/2017   HCT 27.5 (L) 09/06/2017   MCV 65.0 (L) 09/06/2017   PLT 439 (H) 09/06/2017    Lab Results  Component Value Date   CREATININE 4.34 (H) 09/06/2017   BUN 74 (H) 09/06/2017   NA 137 09/06/2017   K 5.6 (H) 09/06/2017    CL 105 09/06/2017   CO2 16 (L) 09/06/2017    Lab Results  Component Value Date   ALT 32 09/06/2017   AST 89 (H) 09/06/2017   ALKPHOS 343 (H) 09/06/2017   BILITOT 5.9 (H) 09/06/2017     Microbiology: Recent Results (from the past 240 hour(s))  Blood Culture (routine x 2)     Status: None (Preliminary result)   Collection Time: 08/13/2017  9:50 AM  Result Value Ref Range Status   Specimen Description BLOOD RIGHT HAND  Final   Special Requests   Final    BOTTLES DRAWN AEROBIC AND ANAEROBIC Blood Culture adequate volume   Culture  Setup Time   Final    GRAM POSITIVE COCCI IN BOTH AEROBIC AND ANAEROBIC BOTTLES CRITICAL RESULT CALLED TO, READ BACK BY AND VERIFIED WITH: G ABBOTT 09/06/17 0006 JDW    Culture   Final    GRAM POSITIVE COCCI CULTURE REINCUBATED FOR BETTER GROWTH Performed at [facility], [place] [address], [place], [place] [address]    Report Status PENDING  Incomplete  Blood Culture (routine x 2)     Status: None (Preliminary result)   Collection Time: 08/18/2017  1:54 PM  Result Value Ref Range Status   Specimen Description BLOOD RIGHT FOREARM  Final   Special Requests   Final    BOTTLES DRAWN AEROBIC AND ANAEROBIC Blood Culture adequate volume   Culture  Setup Time   Final    GRAM POSITIVE COCCI IN BOTH AEROBIC AND ANAEROBIC BOTTLES CRITICAL RESULT CALLED TO, READ BACK BY AND VERIFIED WITH: G ABBOTT PHARMD 09/06/17 0006 JDW    Culture   Final    GRAM POSITIVE COCCI CULTURE REINCUBATED FOR BETTER GROWTH Performed at [facility], [place] [address] [place], [place] [address]    Report Status PENDING  Incomplete  Blood Culture ID Panel (Reflexed)     Status: Abnormal   Collection Time: 08/15/2017  1:54 PM  Result Value Ref Range Status   Enterococcus species NOT DETECTED NOT DETECTED Final   Listeria monocytogenes NOT DETECTED NOT DETECTED Final   Staphylococcus species DETECTED (A) NOT DETECTED Final    Comment: CRITICAL RESULT CALLED TO, READ BACK BY  AND VERIFIED WITH: G ABBOTT PHARMD 09/06/17 0006 JDW    Staphylococcus aureus DETECTED (A) NOT DETECTED Final    Comment: Methicillin (oxacillin) susceptible Staphylococcus aureus (MSSA). Preferred therapy is anti staphylococcal beta lactam antibiotic (Cefazolin or Nafcillin), unless clinically contraindicated. CRITICAL RESULT CALLED TO, READ BACK BY AND VERIFIED WITH: G ABBOTT PHARMD 09/06/17 0006 JDW    Methicillin resistance NOT DETECTED NOT DETECTED Final   Streptococcus species NOT DETECTED NOT DETECTED Final   Streptococcus agalactiae NOT DETECTED NOT DETECTED Final   Streptococcus pneumoniae NOT DETECTED NOT DETECTED Final   Streptococcus pyogenes NOT DETECTED NOT DETECTED Final   Acinetobacter baumannii NOT DETECTED NOT DETECTED Final   Enterobacteriaceae species NOT DETECTED NOT DETECTED Final   Enterobacter cloacae complex NOT DETECTED NOT DETECTED Final   Escherichia coli NOT DETECTED NOT DETECTED Final   Klebsiella oxytoca NOT DETECTED NOT DETECTED Final   Klebsiella pneumoniae NOT DETECTED NOT DETECTED Final   Proteus species NOT DETECTED NOT DETECTED Final   Serratia marcescens NOT DETECTED NOT DETECTED Final   Haemophilus influenzae NOT DETECTED NOT DETECTED Final   Neisseria meningitidis NOT DETECTED NOT DETECTED Final   Pseudomonas aeruginosa NOT DETECTED NOT DETECTED Final   Candida albicans NOT DETECTED NOT DETECTED Final   Candida glabrata NOT DETECTED NOT DETECTED Final   Candida krusei NOT DETECTED NOT DETECTED Final   Candida parapsilosis NOT DETECTED NOT DETECTED Final   Candida tropicalis NOT DETECTED NOT DETECTED Final    Comment: Performed at [facility], [place] [address] [place], [place] [address]  Urine culture     Status: Abnormal (Preliminary result)   Collection Time: 08/08/2017  3:56 PM  Result Value Ref Range Status  Specimen Description URINE, RANDOM  Final   Special Requests NONE  Final   Culture (A)  Final    50,000 COLONIES/mL  STAPHYLOCOCCUS AUREUS SUSCEPTIBILITIES TO FOLLOW Performed at [facility], [address], [place], [place] [address]    Report Status PENDING  Incomplete  MRSA PCR Screening     Status: None   Collection Time: 09/06/17  5:20 AM  Result Value Ref Range Status   MRSA by PCR NEGATIVE NEGATIVE Final    Comment:        The GeneXpert MRSA Assay (FDA approved for NASAL specimens only), is one component of a comprehensive MRSA colonization surveillance program. It is not intended to diagnose MRSA infection nor to guide or monitor treatment for MRSA infections. Performed at [facility], [place] [address] [place], [place] [address]     Michel Bickers, [facility] [phone] pager   [phone] cell 09/06/2017, 2:02 PM

## 2017-09-06 NOTE — ED Notes (Signed)
[id number]  

## 2017-09-06 NOTE — Progress Notes (Signed)
Pharmacy Antibiotic Note  Jessica Howard is a 57 y.o. female admitted on 08/25/2017 with bacteremia.  Pharmacy consulted to switch vancomycin to daptomycin due to AKI. ID suspects lumbar infection as well as infection in left wrist and great toe. TTE ordered.   Plan: Daptomycin 8 mg/kg Q 48 hours Will collect baseline CK and monitor weekly CK after   Height: 5\' 2"  (157.5 cm) Weight: 271 lb 2.7 oz (123 kg) IBW/kg (Calculated) : 50.1  Temp (24hrs), Avg:98.6 F (37 C), Min:97.9 F (36.6 C), Max:99.8 F (37.7 C)  Recent Labs  Lab 08/09/2017 0632 08/27/2017 0806 08/30/2017 1006 09/06/2017 1526 09/04/2017 1531 09/06/2017 2001 09/06/17 0310  WBC 25.1*  --   --   --   --   --  24.0*  CREATININE 3.36*  --   --   --  3.75* 3.91* 4.34*  LATICACIDVEN  --  2.58* 2.59* 2.1*  --  1.9  --     Estimated Creatinine Clearance: 18.3 mL/min (A) (by C-G formula based on SCr of 4.34 mg/dL (H)).    Allergies  Allergen Reactions  . Bactrim [Sulfamethoxazole-Trimethoprim] Hives  . Cefuroxime Axetil Itching  . Cephalosporins Itching  . Iohexol Itching and Rash     Code: RASH, Desc: HAD ITCHING AND A RASH ABOUT ONE HOUR AFTER RETURNING HOME FROM THE CT, Onset Date: [id number]   . Lisinopril Cough  . Penicillins Hives  . Sulfa Antibiotics Hives    Thank you for allowing pharmacy to be a part of this patient's care.  Albertina Parr, PharmD., BCPS Clinical Pharmacist Clinical phone for 09/06/17 until 3:30pm: [phone] If after 3:30pm, please call main pharmacy at: [phone]

## 2017-09-06 NOTE — ED Notes (Signed)
Attempted report 

## 2017-09-06 NOTE — ED Notes (Signed)
CBG 213 

## 2017-09-06 NOTE — Progress Notes (Signed)
PHARMACY - PHYSICIAN COMMUNICATION CRITICAL VALUE ALERT - BLOOD CULTURE IDENTIFICATION (BCID)  Jessica Howard is an 57 y.o. female who presented to [facility] on 08/15/2017 with a chief complaint of sepsis  Assessment:  4/4 blood cultures growing MSSA  Name of physician (or Provider) Contacted: X Blount  Current antibiotics: Vancomycin Aztreonam and Flagyl  Changes to prescribed antibiotics recommended:   Due to penicillin and cephalosporin allergies, recommend narrowing to vancomycin alone.  Will need to look into specific allergy history to see if possible to use cephalosporins  No results found for this or any previous visit.  Jessica Howard 09/06/2017  12:20 AM

## 2017-09-06 NOTE — Progress Notes (Signed)
Hematology brief note   I have reviewed her chart and lab.  Patient is a 57 year old female, who has been seeing my partner Dr. Alen Blew for anemia of chronic disease (CKD and RA), beta thalassemia on Aranesp 300 mcg every 4 weeks, last injection on, August 19, 2017.  She was admitted yesterday for sepsis, AKI, abnormal liver function, and worsening anemia.  No lab evidence of hemolysis (low ret, elevated haptoglobin), his anemia is likely secondary to sepsis.  I recommend blood transfusion, if she does not respond well to blood transfusion, additional dose of Aranesp can be considered (last injection on 3/12). She will f/u with Dr. Alen Blew after discharge.  I have spoken with Dr. Wendee Beavers, who initially requested hem consult but cancelled after reviewed her chart. Call us back if needed.  Truitt Merle  09/06/2017

## 2017-09-06 NOTE — Progress Notes (Signed)
[facility] Progress Note Patient Name: Jessica Howard DOB: August 06, 1960 MRN: [id number]   Date of Service  09/06/2017  HPI/Events of Note  hyperkalemia  eICU Interventions  kayexalate     Intervention Category Minor Interventions: Electrolytes abnormality - evaluation and management  Sharia Reeve 09/06/2017, 5:50 AM

## 2017-09-07 ENCOUNTER — Inpatient Hospital Stay (HOSPITAL_COMMUNITY): Payer: Medicare Other

## 2017-09-07 DIAGNOSIS — I361 Nonrheumatic tricuspid (valve) insufficiency: Secondary | ICD-10-CM

## 2017-09-07 DIAGNOSIS — R609 Edema, unspecified: Secondary | ICD-10-CM

## 2017-09-07 DIAGNOSIS — R7881 Bacteremia: Secondary | ICD-10-CM

## 2017-09-07 DIAGNOSIS — B9561 Methicillin susceptible Staphylococcus aureus infection as the cause of diseases classified elsewhere: Secondary | ICD-10-CM

## 2017-09-07 DIAGNOSIS — R410 Disorientation, unspecified: Secondary | ICD-10-CM

## 2017-09-07 DIAGNOSIS — I959 Hypotension, unspecified: Secondary | ICD-10-CM

## 2017-09-07 LAB — GLUCOSE, CAPILLARY
Glucose-Capillary: 111 mg/dL — ABNORMAL HIGH (ref 65–99)
Glucose-Capillary: 105 mg/dL — ABNORMAL HIGH (ref 65–99)
Glucose-Capillary: 91 mg/dL (ref 65–99)
Glucose-Capillary: 80 mg/dL (ref 65–99)

## 2017-09-07 LAB — PROCALCITONIN: Procalcitonin: 20.69 ng/mL

## 2017-09-07 LAB — CBC
HCT: 25.2 % — ABNORMAL LOW (ref 36.0–46.0)
Hemoglobin: 8.3 g/dL — ABNORMAL LOW (ref 12.0–15.0)
MCH: 21.6 pg — AB (ref 26.0–34.0)
MCHC: 32.9 g/dL (ref 30.0–36.0)
MCV: 65.6 fL — ABNORMAL LOW (ref 78.0–100.0)
PLATELETS: 356 10*3/uL (ref 150–400)
RBC: 3.84 MIL/uL — AB (ref 3.87–5.11)
RDW: 22.1 % — ABNORMAL HIGH (ref 11.5–15.5)
WBC: 23.8 10*3/uL — AB (ref 4.0–10.5)

## 2017-09-07 LAB — COMPREHENSIVE METABOLIC PANEL
ALBUMIN: 1.7 g/dL — AB (ref 3.5–5.0)
ALK PHOS: 382 U/L — AB (ref 38–126)
ALT: 34 U/L (ref 14–54)
AST: 114 U/L — ABNORMAL HIGH (ref 15–41)
Anion gap: 16 — ABNORMAL HIGH (ref 5–15)
BILIRUBIN TOTAL: 5.4 mg/dL — AB (ref 0.3–1.2)
BUN: 83 mg/dL — ABNORMAL HIGH (ref 6–20)
CO2: 15 mmol/L — ABNORMAL LOW (ref 22–32)
Calcium: 6.5 mg/dL — ABNORMAL LOW (ref 8.9–10.3)
Chloride: 108 mmol/L (ref 101–111)
Creatinine, Ser: 5.92 mg/dL — ABNORMAL HIGH (ref 0.44–1.00)
GFR calc Af Amer: 8 mL/min — ABNORMAL LOW (ref >60)
GFR calc non Af Amer: 7 mL/min — ABNORMAL LOW (ref >60)
GLUCOSE: 139 mg/dL — AB (ref 65–99)
POTASSIUM: 5 mmol/L (ref 3.5–5.1)
Sodium: 139 mmol/L (ref 135–145)
TOTAL PROTEIN: 6.2 g/dL — AB (ref 6.5–8.1)

## 2017-09-07 LAB — URINE CULTURE: Culture: 50000 — AB

## 2017-09-07 LAB — SODIUM, URINE, RANDOM: SODIUM UR: 79 mmol/L

## 2017-09-07 LAB — RENAL FUNCTION PANEL
Albumin: 1.6 g/dL — ABNORMAL LOW (ref 3.5–5.0)
Anion gap: 14 (ref 5–15)
BUN: 84 mg/dL — AB (ref 6–20)
CALCIUM: 6.3 mg/dL — AB (ref 8.9–10.3)
CHLORIDE: 106 mmol/L (ref 101–111)
CO2: 16 mmol/L — AB (ref 22–32)
CREATININE: 6.65 mg/dL — AB (ref 0.44–1.00)
GFR calc non Af Amer: 6 mL/min — ABNORMAL LOW (ref >60)
GFR, EST AFRICAN AMERICAN: 7 mL/min — AB (ref >60)
Glucose, Bld: 91 mg/dL (ref 65–99)
Phosphorus: 8.1 mg/dL — ABNORMAL HIGH (ref 2.5–4.6)
Potassium: 5.2 mmol/L — ABNORMAL HIGH (ref 3.5–5.1)
SODIUM: 136 mmol/L (ref 135–145)

## 2017-09-07 LAB — ECHOCARDIOGRAM COMPLETE
Height: 62 in
WEIGHTICAEL: 4395.09 [oz_av]

## 2017-09-07 LAB — CREATININE, URINE, RANDOM: Creatinine, Urine: 131.9 mg/dL

## 2017-09-07 MED ORDER — PRISMASOL BGK 0/2.5 32-2.5 MEQ/L IV SOLN
INTRAVENOUS | Status: DC
  Administered 2017-09-07 – 2017-09-10 (×19): via INTRAVENOUS_CENTRAL
  Filled 2017-09-07 (×29): qty 5000

## 2017-09-07 MED ORDER — DAPTOMYCIN 500 MG IV SOLR
1000.0000 mg | INTRAVENOUS | Status: DC
  Administered 2017-09-08 – 2017-09-14 (×4): 1000 mg via INTRAVENOUS
  Filled 2017-09-07 (×5): qty 20

## 2017-09-07 MED ORDER — HEPARIN SODIUM (PORCINE) 1000 UNIT/ML DIALYSIS
1000.0000 [IU] | INTRAMUSCULAR | Status: DC | PRN
  Administered 2017-09-07: 2800 [IU] via INTRAVENOUS_CENTRAL
  Administered 2017-09-09: 2000 [IU] via INTRAVENOUS_CENTRAL
  Filled 2017-09-07: qty 6
  Filled 2017-09-07: qty 3
  Filled 2017-09-07 (×2): qty 6

## 2017-09-07 MED ORDER — SODIUM CHLORIDE 0.9 % IV BOLUS
1000.0000 mL | Freq: Once | INTRAVENOUS | Status: DC
Start: 2017-09-07 — End: 2017-09-07

## 2017-09-07 MED ORDER — PRISMASOL BGK 4/2.5 32-4-2.5 MEQ/L IV SOLN
INTRAVENOUS | Status: DC
  Administered 2017-09-07 – 2017-09-10 (×5): via INTRAVENOUS_CENTRAL
  Filled 2017-09-07 (×8): qty 5000

## 2017-09-07 MED ORDER — PRISMASOL BGK 4/2.5 32-4-2.5 MEQ/L IV SOLN
INTRAVENOUS | Status: DC
  Administered 2017-09-07 – 2017-09-11 (×9): via INTRAVENOUS_CENTRAL
  Filled 2017-09-07 (×14): qty 5000

## 2017-09-07 NOTE — Progress Notes (Signed)
SLP Cancellation Note  Patient Details Name: Jessica Howard MRN: [id number] DOB: [birth year]   Cancelled treatment:       Reason Eval/Treat Not Completed: Patient at procedure or test/unavailable. Echo in progress. Will reattempt next date.  [name], [place], [place] Speech-Language Pathologist [phone]   [name] 09/07/2017, 2:35 PM

## 2017-09-07 NOTE — Consult Note (Addendum)
Reason for Consult:Renal failure Referring Physician: Dr. Velvet Bathe  Chief Complaint: Back pain and confusion  Assessment/Plan: 1. Acute kidney injury secondary to ATN, over 10L net positive with minimal UOP bordering on anuria. ATN secondary to sepsis from MSSA.  - Initiate CRRT as she won't tolerate iHD. - Renal dose meds for CRRT  - Daptomycin will get partially removed and she could tolerate '6mg'$ /kg qdaily which should not lead to significant accumulation. - No UF for now; she will still likely need to start pressors once CRRT is initiated.  Seen on CVVHD '@4pm'$  Pre/post replacement 4k Dialysate 2K '@1500'$  -> will switch to 4K in the AM RIJ access   No heparin for now No UF.  2. MSSA sepsis - see above. - Echocardiogram may be helpful to determine duration of treatment and possible source. 3. UTI - Staph aureus. 4. RA 5. Thalassemia 6. Anemia   HPI: Jessica Howard is an 57 y.o. female with RA on immunosuppressives, chronic pain treated with narcotics, hypertension, DM presenting with worsening back pain for 2 weeks. She had pain in the right GT and left hand as well and presented to the ED with confusion, very dark urine, discoloration of the rt GT, anorexia and was found to be in AKI with an initial ED BUN/Cr of 69/3.36 which has steadily worsened. She has gotten 10L of isotonic fluid and yet her renal function has continued to worsen w/ hypotension noted and poor uop. Her lactate and procalcitonin were both elevated. Her Cr which was 0.8 03/06/2017 increased to 3.36 on 3/29 and has steadily increased to 5.92 with a marked decrease in UOP. Her sister was bedside and they deny any hematuria, rashes, NSAID use or prior episodes of AKI or being on HD.   ROS Pertinent items are noted in HPI.  Chemistry and CBC: Creatinine  Date/Time Value Ref Range Status  03/06/2017 09:29 AM 0.8 0.6 - 1.1 mg/dL Final  09/04/2016 09:30 AM 0.8 0.6 - 1.1 mg/dL Final  06/05/2016 09:41 AM 1.0 0.6 -  1.1 mg/dL Final  02/14/2016 09:20 AM 0.9 0.6 - 1.1 mg/dL Final  08/08/2015 10:26 AM 1.1 0.6 - 1.1 mg/dL Final  04/20/2014 08:52 AM 1.6 (H) 0.6 - 1.1 mg/dL Final  02/13/2012 08:50 AM 0.9 0.6 - 1.1 mg/dL Final   Creatinine, Ser  Date/Time Value Ref Range Status  09/06/2017 11:49 PM 5.92 (H) 0.44 - 1.00 mg/dL Final  09/06/2017 03:10 AM 4.34 (H) 0.44 - 1.00 mg/dL Final  08/15/2017 08:01 PM 3.91 (H) 0.44 - 1.00 mg/dL Final  08/14/2017 03:31 PM 3.75 (H) 0.44 - 1.00 mg/dL Final  09/06/2017 06:32 AM 3.36 (H) 0.44 - 1.00 mg/dL Final  01/19/2016 01:50 PM 0.94 0.44 - 1.00 mg/dL Final  12/02/2015 04:50 AM 0.98 0.44 - 1.00 mg/dL Final  12/01/2015 02:04 AM 0.77 0.44 - 1.00 mg/dL Final  05/21/2014 03:29 AM 0.99 0.50 - 1.10 mg/dL Final  05/20/2014 09:28 PM 0.99 0.50 - 1.10 mg/dL Final  05/20/2014 07:50 AM 1.01 0.50 - 1.10 mg/dL Final  03/01/2014 04:00 AM 1.14 (H) 0.50 - 1.10 mg/dL Final  02/28/2014 11:02 AM 1.09 0.50 - 1.10 mg/dL Final  02/28/2014 12:56 AM 1.17 (H) 0.50 - 1.10 mg/dL Final  02/27/2014 08:01 PM 1.29 (H) 0.50 - 1.10 mg/dL Final  10/19/2012 01:45 PM 0.65 0.50 - 1.10 mg/dL Final  01/09/2010 09:11 AM 0.67 0.40 - 1.20 mg/dL Final  08/26/2009 11:22 PM 0.4 0.4 - 1.2 mg/dL Final  07/12/2009 12:35 PM 0.54 0.40 - 1.20  mg/dL Final  01/01/2008 09:05 AM 0.51  Final  01/01/2007 07:05 AM 0.57  Final  12/31/2006 06:00 AM 0.60  Final  12/30/2006 05:13 PM 0.52  Final  12/30/2006 01:01 PM 0.6  Final   Recent Labs  Lab 08/22/2017 0632 08/23/2017 1531 08/30/2017 2001 09/06/17 0310 09/06/17 2349  NA 132* 136 135 137 139  K 5.1 5.6* 6.0* 5.6* 5.0  CL 97* 103 104 105 108  CO2 18* 18* 15* 16* 15*  GLUCOSE 337* 298* 275* 190* 139*  BUN 69* 69* 69* 74* 83*  CREATININE 3.36* 3.75* 3.91* 4.34* 5.92*  CALCIUM 7.6* 7.0* 7.0* 7.1* 6.5*  PHOS 5.9*  --   --   --   --    Recent Labs  Lab 09/06/2017 0632 09/06/17 0310 09/06/17 2349  WBC 25.1* 24.0* 23.8*  NEUTROABS 22.1*  --   --   HGB 7.6* 8.9* 8.3*   HCT 23.3* 27.5* 25.2*  MCV 61.0* 65.0* 65.6*  PLT 423* 439* 356   Liver Function Tests: Recent Labs  Lab 08/13/2017 2001 09/06/17 0310 09/06/17 2349  AST 87* 89* 114*  ALT 34 32 34  ALKPHOS 354* 343* 382*  BILITOT 6.2* 5.9* 5.4*  PROT 6.8 6.8 6.2*  ALBUMIN 2.1* 2.0* 1.7*   Recent Labs  Lab 08/09/2017 0815  LIPASE 28   Recent Labs  Lab 09/01/2017 0956 09/06/17 0310  AMMONIA 38* 101*   Cardiac Enzymes: Recent Labs  Lab 09/07/2017 0632 09/06/17 1030  CKTOTAL 55 233   Iron Studies:  Recent Labs    08/20/2017 1531  IRON 33  TIBC 136*  FERRITIN 4,025*   PT/INR: '@LABRCNTIP'$ (inr:5)  Xrays/Other Studies: ) Results for orders placed or performed during the hospital encounter of 08/29/2017 (from the past 48 hour(s))  Blood Culture (routine x 2)     Status: Abnormal (Preliminary result)   Collection Time: 09/01/2017  1:54 PM  Result Value Ref Range   Specimen Description BLOOD RIGHT FOREARM    Special Requests      BOTTLES DRAWN AEROBIC AND ANAEROBIC Blood Culture adequate volume   Culture  Setup Time      GRAM POSITIVE COCCI IN BOTH AEROBIC AND ANAEROBIC BOTTLES CRITICAL RESULT CALLED TO, READ BACK BY AND VERIFIED WITH: G ABBOTT PHARMD 09/06/17 0006 JDW    Culture (A)     STAPHYLOCOCCUS AUREUS SUSCEPTIBILITIES TO FOLLOW Performed at [facility], [address], [place], [place] [address]    Report Status PENDING   Blood Culture ID Panel (Reflexed)     Status: Abnormal   Collection Time: 08/13/2017  1:54 PM  Result Value Ref Range   Enterococcus species NOT DETECTED NOT DETECTED   Listeria monocytogenes NOT DETECTED NOT DETECTED   Staphylococcus species DETECTED (A) NOT DETECTED    Comment: CRITICAL RESULT CALLED TO, READ BACK BY AND VERIFIED WITH: G ABBOTT PHARMD 09/06/17 0006 JDW    Staphylococcus aureus DETECTED (A) NOT DETECTED    Comment: Methicillin (oxacillin) susceptible Staphylococcus aureus (MSSA). Preferred therapy is anti staphylococcal beta lactam  antibiotic (Cefazolin or Nafcillin), unless clinically contraindicated. CRITICAL RESULT CALLED TO, READ BACK BY AND VERIFIED WITH: G ABBOTT PHARMD 09/06/17 0006 JDW    Methicillin resistance NOT DETECTED NOT DETECTED   Streptococcus species NOT DETECTED NOT DETECTED   Streptococcus agalactiae NOT DETECTED NOT DETECTED   Streptococcus pneumoniae NOT DETECTED NOT DETECTED   Streptococcus pyogenes NOT DETECTED NOT DETECTED   Acinetobacter baumannii NOT DETECTED NOT DETECTED   Enterobacteriaceae species NOT DETECTED  NOT DETECTED   Enterobacter cloacae complex NOT DETECTED NOT DETECTED   Escherichia coli NOT DETECTED NOT DETECTED   Klebsiella oxytoca NOT DETECTED NOT DETECTED   Klebsiella pneumoniae NOT DETECTED NOT DETECTED   Proteus species NOT DETECTED NOT DETECTED   Serratia marcescens NOT DETECTED NOT DETECTED   Haemophilus influenzae NOT DETECTED NOT DETECTED   Neisseria meningitidis NOT DETECTED NOT DETECTED   Pseudomonas aeruginosa NOT DETECTED NOT DETECTED   Candida albicans NOT DETECTED NOT DETECTED   Candida glabrata NOT DETECTED NOT DETECTED   Candida krusei NOT DETECTED NOT DETECTED   Candida parapsilosis NOT DETECTED NOT DETECTED   Candida tropicalis NOT DETECTED NOT DETECTED    Comment: Performed at [facility], [place] [address] [place], [place] [address]  Lactic acid, plasma     Status: Abnormal   Collection Time: 08/17/2017  3:26 PM  Result Value Ref Range   Lactic Acid, Venous 2.1 (HH) 0.5 - 1.9 mmol/L    Comment: CRITICAL RESULT CALLED TO, READ BACK BY AND VERIFIED WITH: R.Diebold,RN 08/28/2017 1658 DAVISB Performed at [facility], [place] [address], [place], [place] [address]   CBG monitoring, ED     Status: Abnormal   Collection Time: 09/07/2017  3:28 PM  Result Value Ref Range   Glucose-Capillary 285 (H) 65 - 99 mg/dL   Comment 1 Notify RN    Comment 2 Document in Chart   Uric acid     Status: Abnormal   Collection Time: 09/04/2017  3:31 PM  Result Value Ref  Range   Uric Acid, Serum 10.3 (H) 2.3 - 6.6 mg/dL    Comment: Performed at [facility] [address] [place], [place] [address]  TSH     Status: None   Collection Time: 09/07/2017  3:31 PM  Result Value Ref Range   TSH 0.822 0.350 - 4.500 uIU/mL    Comment: Performed by a 3rd Generation assay with a functional sensitivity of <=0.01 uIU/mL. Performed at [facility], [place] [address] [place], [place] [address]   Sedimentation rate     Status: Abnormal   Collection Time: 08/25/2017  3:31 PM  Result Value Ref Range   Sed Rate 122 (H) 0 - 22 mm/hr    Comment: Performed at [facility] [address], [place], [place] [address]  Procalcitonin     Status: None   Collection Time: 08/16/2017  3:31 PM  Result Value Ref Range   Procalcitonin 11.11 ng/mL    Comment:        Interpretation: PCT >= 10 ng/mL: Important systemic inflammatory response, almost exclusively due to severe bacterial sepsis or septic shock. (NOTE)       Sepsis PCT Algorithm           Lower Respiratory Tract                                      Infection PCT Algorithm    ----------------------------     ----------------------------         PCT < 0.25 ng/mL                PCT < 0.10 ng/mL         Strongly encourage             Strongly discourage   discontinuation of antibiotics    initiation of antibiotics    ----------------------------     -----------------------------  PCT 0.25 - 0.50 ng/mL            PCT 0.10 - 0.25 ng/mL               OR       >80% decrease in PCT            Discourage initiation of                                            antibiotics      Encourage discontinuation           of antibiotics    ----------------------------     -----------------------------         PCT >= 0.50 ng/mL              PCT 0.26 - 0.50 ng/mL                AND       <80% decrease in PCT             Encourage initiation of                                             antibiotics       Encourage  continuation           of antibiotics    ----------------------------     -----------------------------        PCT >= 0.50 ng/mL                  PCT > 0.50 ng/mL               AND         increase in PCT                  Strongly encourage                                      initiation of antibiotics    Strongly encourage escalation           of antibiotics                                     -----------------------------                                           PCT <= 0.25 ng/mL                                                 OR                                        > 80% decrease in PCT  Discontinue / Do not initiate                                             antibiotics Performed at [facility], [place] [address], [place], [place] [address]   APTT     Status: None   Collection Time: 08/12/2017  3:31 PM  Result Value Ref Range   aPTT 36 24 - 36 seconds    Comment: Performed at [facility] [address] [place], [place] [address]  Basic metabolic panel     Status: Abnormal   Collection Time: 09/01/2017  3:31 PM  Result Value Ref Range   Sodium 136 135 - 145 mmol/L   Potassium 5.6 (H) 3.5 - 5.1 mmol/L   Chloride 103 101 - 111 mmol/L   CO2 18 (L) 22 - 32 mmol/L   Glucose, Bld 298 (H) 65 - 99 mg/dL   BUN 69 (H) 6 - 20 mg/dL   Creatinine, Ser 3.75 (H) 0.44 - 1.00 mg/dL   Calcium 7.0 (L) 8.9 - 10.3 mg/dL   GFR calc non Af Amer 13 (L) >60 mL/min   GFR calc Af Amer 15 (L) >60 mL/min    Comment: (NOTE) The eGFR has been calculated using the CKD EPI equation. This calculation has not been validated in all clinical situations. eGFR's persistently <60 mL/min signify possible Chronic Kidney Disease.    Anion gap 15 5 - 15    Comment: Performed at [facility] [address], [place], [place] [address]  Lactate dehydrogenase     Status: Abnormal   Collection Time: 08/27/2017  3:31 PM  Result Value Ref Range   LDH 382 (H) 98 -  192 U/L    Comment: Performed at [facility], [place] [address] [place], [place] [address]  Haptoglobin     Status: Abnormal   Collection Time: 08/19/2017  3:31 PM  Result Value Ref Range   Haptoglobin 410 (H) 34 - 200 mg/dL    Comment: (NOTE) Performed At: [facility] [place], [place] [id number] Rush Farmer MD [id number] Performed at [facility], [place] [address], [place], [place] [address]   Reticulocytes     Status: Abnormal   Collection Time: 08/27/2017  3:31 PM  Result Value Ref Range   Retic Ct Pct 0.7 0.4 - 3.1 %   RBC. 3.80 (L) 3.87 - 5.11 MIL/uL   Retic Count, Absolute 26.6 19.0 - 186.0 K/uL    Comment: Performed at [facility] [address], [place], [place] [address]  Hepatic function panel     Status: Abnormal   Collection Time: 08/28/2017  3:31 PM  Result Value Ref Range   Total Protein 6.4 (L) 6.5 - 8.1 g/dL   Albumin 1.8 (L) 3.5 - 5.0 g/dL   AST 81 (H) 15 - 41 U/L   ALT 32 14 - 54 U/L   Alkaline Phosphatase 335 (H) 38 - 126 U/L   Total Bilirubin 5.4 (H) 0.3 - 1.2 mg/dL   Bilirubin, Direct 3.5 (H) 0.1 - 0.5 mg/dL   Indirect Bilirubin 1.9 (H) 0.3 - 0.9 mg/dL    Comment: Performed at [facility] [address], [place], [place] [address]  Technologist smear review     Status: None   Collection Time: 08/12/2017  3:31 PM  Result Value Ref Range   Tech Review RARE NRBCs     Comment: TARGET CELLS ELLIPTOCYTES POLYCHROMASIA PRESENT MILD LEFT SHIFT (1-5% METAS, OCC MYELO, OCC BANDS) Performed at [facility] [address], [place], [place] [address]   Save smear     Status: None   Collection Time: 08/27/2017  3:31 PM  Result Value Ref Range   Smear Review SMEAR STAINED AND AVAILABLE FOR REVIEW     Comment: Performed at [facility], [place] [address] [place], [place] [address]  Vitamin B12     Status: None   Collection Time: 08/30/2017  3:31 PM  Result Value Ref Range   Vitamin B-12 429 180 - 914 pg/mL     Comment: (NOTE) This assay is not validated for testing neonatal or myeloproliferative syndrome specimens for Vitamin B12 levels. Performed at [facility], [place] [address], [place], [place] [address]   Folate     Status: None   Collection Time: 08/26/2017  3:31 PM  Result Value Ref Range   Folate 6.0 >5.9 ng/mL    Comment: Performed at [facility], [place] [address] [place], [place] [address]  Iron and TIBC     Status: Abnormal   Collection Time: 09/03/2017  3:31 PM  Result Value Ref Range   Iron 33 28 - 170 ug/dL   TIBC 136 (L) 250 - 450 ug/dL   Saturation Ratios 24 10.4 - 31.8 %   UIBC 103 ug/dL    Comment: Performed at [facility], [place] [address], [place], [place] [address]  Ferritin     Status: Abnormal   Collection Time: 09/07/2017  3:31 PM  Result Value Ref Range   Ferritin 4,025 (H) 11 - 307 ng/mL    Comment: Performed at [facility], [place] [address] [place], [place] [address]  HIV antibody (Routine Testing)     Status: None   Collection Time: 08/08/2017  3:31 PM  Result Value Ref Range   HIV Screen 4th Generation wRfx Non Reactive Non Reactive    Comment: (NOTE) Performed At: [facility] [place], [place] [id number] Rush Farmer MD [id number] Performed at [facility], [place] [address], [place], [place] [address]   Protime-INR     Status: Abnormal   Collection Time: 08/23/2017  3:31 PM  Result Value Ref Range   Prothrombin Time 17.5 (H) 11.4 - 15.2 seconds   INR 1.45     Comment: Performed at [facility] [address], [place], [place] [address]  Urinalysis, Routine w reflex microscopic     Status: Abnormal   Collection Time: 08/15/2017  3:56 PM  Result Value Ref Range   Color, Urine AMBER (A) YELLOW    Comment: BIOCHEMICALS MAY BE AFFECTED BY COLOR   APPearance CLOUDY (A) CLEAR   Specific Gravity, Urine 1.023 1.005 - 1.030   pH 5.0 5.0 - 8.0   Glucose, UA 50 (A) NEGATIVE mg/dL   Hgb urine dipstick SMALL  (A) NEGATIVE   Bilirubin Urine MODERATE (A) NEGATIVE   Ketones, ur NEGATIVE NEGATIVE mg/dL   Protein, ur 30 (A) NEGATIVE mg/dL   Nitrite NEGATIVE NEGATIVE   Leukocytes, UA SMALL (A) NEGATIVE   RBC / HPF 6-30 0 - 5 RBC/hpf   WBC, UA TOO NUMEROUS TO COUNT 0 - 5 WBC/hpf   Bacteria, UA MANY (A) NONE SEEN   Squamous Epithelial / LPF 0-5 (A) NONE SEEN  WBC Clumps PRESENT    Amorphous Crystal PRESENT    Non Squamous Epithelial 0-5 (A) NONE SEEN    Comment: Performed at [facility], [place] [address], [place], [place] [address]  Urine culture     Status: Abnormal   Collection Time: 08/18/2017  3:56 PM  Result Value Ref Range   Specimen Description URINE, RANDOM    Special Requests      NONE Performed at [facility], [place] [address], [place], [place] [address]    Culture 50,000 COLONIES/mL STAPHYLOCOCCUS AUREUS (A)    Report Status 09/07/2017 FINAL    Organism ID, Bacteria STAPHYLOCOCCUS AUREUS (A)       Susceptibility   Staphylococcus aureus - MIC*    CIPROFLOXACIN <=0.5 SENSITIVE Sensitive     GENTAMICIN <=0.5 SENSITIVE Sensitive     NITROFURANTOIN 32 SENSITIVE Sensitive     OXACILLIN 0.5 SENSITIVE Sensitive     TETRACYCLINE <=1 SENSITIVE Sensitive     VANCOMYCIN 1 SENSITIVE Sensitive     TRIMETH/SULFA <=10 SENSITIVE Sensitive     CLINDAMYCIN <=0.25 SENSITIVE Sensitive     RIFAMPIN <=0.5 SENSITIVE Sensitive     Inducible Clindamycin NEGATIVE Sensitive     * 50,000 COLONIES/mL STAPHYLOCOCCUS AUREUS  Urine rapid drug screen (hosp performed)     Status: Abnormal   Collection Time: 08/19/2017  3:56 PM  Result Value Ref Range   Opiates POSITIVE (A) NONE DETECTED   Cocaine NONE DETECTED NONE DETECTED   Benzodiazepines NONE DETECTED NONE DETECTED   Amphetamines NONE DETECTED NONE DETECTED   Tetrahydrocannabinol NONE DETECTED NONE DETECTED   Barbiturates NONE DETECTED NONE DETECTED    Comment: (NOTE) DRUG SCREEN FOR MEDICAL PURPOSES ONLY.  IF CONFIRMATION IS NEEDED FOR ANY  PURPOSE, NOTIFY LAB WITHIN 5 DAYS. LOWEST DETECTABLE LIMITS FOR URINE DRUG SCREEN Drug Class                     Cutoff (ng/mL) Amphetamine and metabolites    1000 Barbiturate and metabolites    200 Benzodiazepine                 062 Tricyclics and metabolites     300 Opiates and metabolites        300 Cocaine and metabolites        300 THC                            50 Performed at [facility], [place] [address] [place], [place] [address]   Lactic acid, plasma     Status: None   Collection Time: 08/31/2017  8:01 PM  Result Value Ref Range   Lactic Acid, Venous 1.9 0.5 - 1.9 mmol/L    Comment: Performed at [facility] [address] [place], [place] [address]  Comprehensive metabolic panel     Status: Abnormal   Collection Time: 08/21/2017  8:01 PM  Result Value Ref Range   Sodium 135 135 - 145 mmol/L   Potassium 6.0 (H) 3.5 - 5.1 mmol/L   Chloride 104 101 - 111 mmol/L   CO2 15 (L) 22 - 32 mmol/L   Glucose, Bld 275 (H) 65 - 99 mg/dL   BUN 69 (H) 6 - 20 mg/dL   Creatinine, Ser 3.91 (H) 0.44 - 1.00 mg/dL   Calcium 7.0 (L) 8.9 - 10.3 mg/dL   Total Protein 6.8 6.5 - 8.1 g/dL   Albumin  2.1 (L) 3.5 - 5.0 g/dL   AST 87 (H) 15 - 41 U/L   ALT 34 14 - 54 U/L   Alkaline Phosphatase 354 (H) 38 - 126 U/L   Total Bilirubin 6.2 (H) 0.3 - 1.2 mg/dL   GFR calc non Af Amer 12 (L) >60 mL/min   GFR calc Af Amer 14 (L) >60 mL/min    Comment: (NOTE) The eGFR has been calculated using the CKD EPI equation. This calculation has not been validated in all clinical situations. eGFR's persistently <60 mL/min signify possible Chronic Kidney Disease.    Anion gap 16 (H) 5 - 15    Comment: Performed at [facility], [place] [address] [place], [place] [address]  CBG monitoring, ED     Status: Abnormal   Collection Time: 09/06/2017  8:07 PM  Result Value Ref Range   Glucose-Capillary 277 (H) 65 - 99 mg/dL   Comment 1 Notify RN    Comment 2 Document in Chart   CBG monitoring, ED      Status: Abnormal   Collection Time: 09/06/17 12:26 AM  Result Value Ref Range   Glucose-Capillary 238 (H) 65 - 99 mg/dL  Procalcitonin     Status: None   Collection Time: 09/06/17  3:10 AM  Result Value Ref Range   Procalcitonin 18.40 ng/mL    Comment:        Interpretation: PCT >= 10 ng/mL: Important systemic inflammatory response, almost exclusively due to severe bacterial sepsis or septic shock. (NOTE)       Sepsis PCT Algorithm           Lower Respiratory Tract                                      Infection PCT Algorithm    ----------------------------     ----------------------------         PCT < 0.25 ng/mL                PCT < 0.10 ng/mL         Strongly encourage             Strongly discourage   discontinuation of antibiotics    initiation of antibiotics    ----------------------------     -----------------------------       PCT 0.25 - 0.50 ng/mL            PCT 0.10 - 0.25 ng/mL               OR       >80% decrease in PCT            Discourage initiation of                                            antibiotics      Encourage discontinuation           of antibiotics    ----------------------------     -----------------------------         PCT >= 0.50 ng/mL              PCT 0.26 - 0.50 ng/mL                AND       <80% decrease in  PCT             Encourage initiation of                                             antibiotics       Encourage continuation           of antibiotics    ----------------------------     -----------------------------        PCT >= 0.50 ng/mL                  PCT > 0.50 ng/mL               AND         increase in PCT                  Strongly encourage                                      initiation of antibiotics    Strongly encourage escalation           of antibiotics                                     -----------------------------                                           PCT <= 0.25 ng/mL                                                  OR                                        > 80% decrease in PCT                                     Discontinue / Do not initiate                                             antibiotics Performed at [facility], [place] [address], [place], [place] [address]   CBC     Status: Abnormal   Collection Time: 09/06/17  3:10 AM  Result Value Ref Range   WBC 24.0 (H) 4.0 - 10.5 K/uL   RBC 4.23 3.87 - 5.11 MIL/uL   Hemoglobin 8.9 (L) 12.0 - 15.0 g/dL   HCT 27.5 (L) 36.0 - 46.0 %   MCV 65.0 (L) 78.0 - 100.0 fL   MCH 21.0 (L) 26.0 - 34.0 pg   MCHC 32.4 30.0 - 36.0 g/dL   RDW 21.4 (H) 11.5 - 15.5 %   Platelets 439 (H) 150 -  400 K/uL    Comment: Performed at [facility], [place] [address] [place], [place] [address]  Comprehensive metabolic panel     Status: Abnormal   Collection Time: 09/06/17  3:10 AM  Result Value Ref Range   Sodium 137 135 - 145 mmol/L   Potassium 5.6 (H) 3.5 - 5.1 mmol/L   Chloride 105 101 - 111 mmol/L   CO2 16 (L) 22 - 32 mmol/L   Glucose, Bld 190 (H) 65 - 99 mg/dL   BUN 74 (H) 6 - 20 mg/dL   Creatinine, Ser 4.34 (H) 0.44 - 1.00 mg/dL   Calcium 7.1 (L) 8.9 - 10.3 mg/dL   Total Protein 6.8 6.5 - 8.1 g/dL   Albumin 2.0 (L) 3.5 - 5.0 g/dL   AST 89 (H) 15 - 41 U/L   ALT 32 14 - 54 U/L   Alkaline Phosphatase 343 (H) 38 - 126 U/L   Total Bilirubin 5.9 (H) 0.3 - 1.2 mg/dL   GFR calc non Af Amer 11 (L) >60 mL/min   GFR calc Af Amer 12 (L) >60 mL/min    Comment: (NOTE) The eGFR has been calculated using the CKD EPI equation. This calculation has not been validated in all clinical situations. eGFR's persistently <60 mL/min signify possible Chronic Kidney Disease.    Anion gap 16 (H) 5 - 15    Comment: Performed at [facility], [place] [address] [place], [place] [address]  Ammonia     Status: Abnormal   Collection Time: 09/06/17  3:10 AM  Result Value Ref Range   Ammonia 101 (H) 9 - 35 umol/L    Comment: Performed at [facility], [place]  [address] [place], [place] [address]  CBG monitoring, ED     Status: Abnormal   Collection Time: 09/06/17  3:38 AM  Result Value Ref Range   Glucose-Capillary 213 (H) 65 - 99 mg/dL  MRSA PCR Screening     Status: None   Collection Time: 09/06/17  5:20 AM  Result Value Ref Range   MRSA by PCR NEGATIVE NEGATIVE    Comment:        The GeneXpert MRSA Assay (FDA approved for NASAL specimens only), is one component of a comprehensive MRSA colonization surveillance program. It is not intended to diagnose MRSA infection nor to guide or monitor treatment for MRSA infections. Performed at [facility], [place] [address], [place], [place] [address]   Glucose, capillary     Status: Abnormal   Collection Time: 09/06/17  5:23 AM  Result Value Ref Range   Glucose-Capillary 164 (H) 65 - 99 mg/dL   Comment 1 Capillary Specimen    Comment 2 Notify RN   Glucose, capillary     Status: Abnormal   Collection Time: 09/06/17  7:22 AM  Result Value Ref Range   Glucose-Capillary 145 (H) 65 - 99 mg/dL   Comment 1 Venous Specimen    Comment 2 Notify RN   Direct antiglobulin test (not at [facility])     Status: None   Collection Time: 09/06/17 10:23 AM  Result Value Ref Range   DAT, complement NEG    DAT, IgG      NEG Performed at [facility] [address] [place],  [address]   CK     Status: None   Collection Time: 09/06/17 10:30 AM  Result Value Ref Range   Total CK 233 38 - 234 U/L  Comment: Performed at [facility], [place] [address], [place], [place] [address]  Glucose, capillary     Status: Abnormal   Collection Time: 09/06/17 11:50 AM  Result Value Ref Range   Glucose-Capillary 160 (H) 65 - 99 mg/dL   Comment 1 Capillary Specimen    Comment 2 Notify RN   Glucose, capillary     Status: Abnormal   Collection Time: 09/06/17  3:36 PM  Result Value Ref Range   Glucose-Capillary 169 (H) 65 - 99 mg/dL   Comment 1 Capillary Specimen    Comment 2 Notify RN   Glucose, capillary      Status: Abnormal   Collection Time: 09/06/17  7:35 PM  Result Value Ref Range   Glucose-Capillary 129 (H) 65 - 99 mg/dL   Comment 1 Notify RN   CBC     Status: Abnormal   Collection Time: 09/06/17 11:49 PM  Result Value Ref Range   WBC 23.8 (H) 4.0 - 10.5 K/uL   RBC 3.84 (L) 3.87 - 5.11 MIL/uL   Hemoglobin 8.3 (L) 12.0 - 15.0 g/dL   HCT 25.2 (L) 36.0 - 46.0 %   MCV 65.6 (L) 78.0 - 100.0 fL   MCH 21.6 (L) 26.0 - 34.0 pg   MCHC 32.9 30.0 - 36.0 g/dL   RDW 22.1 (H) 11.5 - 15.5 %   Platelets 356 150 - 400 K/uL    Comment: Performed at [facility], [place], [place], [place] [address]  Procalcitonin     Status: None   Collection Time: 09/06/17 11:49 PM  Result Value Ref Range   Procalcitonin 20.69 ng/mL    Comment:        Interpretation: PCT >= 10 ng/mL: Important systemic inflammatory response, almost exclusively due to severe bacterial sepsis or septic shock. (NOTE)       Sepsis PCT Algorithm           Lower Respiratory Tract                                      Infection PCT Algorithm    ----------------------------     ----------------------------         PCT < 0.25 ng/mL                PCT < 0.10 ng/mL         Strongly encourage             Strongly discourage   discontinuation of antibiotics    initiation of antibiotics    ----------------------------     -----------------------------       PCT 0.25 - 0.50 ng/mL            PCT 0.10 - 0.25 ng/mL               OR       >80% decrease in PCT            Discourage initiation of                                            antibiotics      Encourage discontinuation           of antibiotics    ----------------------------     -----------------------------  PCT >= 0.50 ng/mL              PCT 0.26 - 0.50 ng/mL                AND       <80% decrease in PCT             Encourage initiation of                                             antibiotics       Encourage continuation           of antibiotics     ----------------------------     -----------------------------        PCT >= 0.50 ng/mL                  PCT > 0.50 ng/mL               AND         increase in PCT                  Strongly encourage                                      initiation of antibiotics    Strongly encourage escalation           of antibiotics                                     -----------------------------                                           PCT <= 0.25 ng/mL                                                 OR                                        > 80% decrease in PCT                                     Discontinue / Do not initiate                                             antibiotics Performed at [facility], [address], [place], [place] [address]   Comprehensive metabolic panel     Status: Abnormal   Collection Time: 09/06/17 11:49 PM  Result Value Ref Range   Sodium 139 135 - 145 mmol/L   Potassium 5.0 3.5 - 5.1 mmol/L   Chloride 108 101 - 111 mmol/L   CO2 15 (L)  22 - 32 mmol/L   Glucose, Bld 139 (H) 65 - 99 mg/dL   BUN 83 (H) 6 - 20 mg/dL   Creatinine, Ser 5.92 (H) 0.44 - 1.00 mg/dL   Calcium 6.5 (L) 8.9 - 10.3 mg/dL   Total Protein 6.2 (L) 6.5 - 8.1 g/dL   Albumin 1.7 (L) 3.5 - 5.0 g/dL   AST 114 (H) 15 - 41 U/L   ALT 34 14 - 54 U/L   Alkaline Phosphatase 382 (H) 38 - 126 U/L   Total Bilirubin 5.4 (H) 0.3 - 1.2 mg/dL   GFR calc non Af Amer 7 (L) >60 mL/min   GFR calc Af Amer 8 (L) >60 mL/min    Comment: (NOTE) The eGFR has been calculated using the CKD EPI equation. This calculation has not been validated in all clinical situations. eGFR's persistently <60 mL/min signify possible Chronic Kidney Disease.    Anion gap 16 (H) 5 - 15    Comment: Performed at [facility], [place] [address] [place], [place] [address]  Glucose, capillary     Status: Abnormal   Collection Time: 09/07/17  7:27 AM  Result Value Ref Range   Glucose-Capillary 111 (H) 65 - 99 mg/dL   Comment  1 Capillary Specimen    Comment 2 Notify RN   Glucose, capillary     Status: Abnormal   Collection Time: 09/07/17 11:34 AM  Result Value Ref Range   Glucose-Capillary 105 (H) 65 - 99 mg/dL   Comment 1 Capillary Specimen    Comment 2 Notify RN    US Abdomen Complete  Result Date: 09/01/2017 CLINICAL DATA:  Elevated liver enzymes EXAM: ABDOMEN ULTRASOUND COMPLETE COMPARISON:  None. FINDINGS: Gallbladder: No gallstones or wall thickening visualized. No pericholecystic fluid. No sonographic Murphy sign noted by sonographer. Common bile duct: Diameter: 2 mm. No intrahepatic, common hepatic, or common bile duct dilatation. Liver: No focal lesion identified. Within normal limits in parenchymal echogenicity. Portal vein is patent on color Doppler imaging with normal direction of blood flow towards the liver. IVC: No abnormality visualized. Pancreas: No pancreatic mass or inflammatory focus. Spleen: Size and appearance within normal limits. Right Kidney: Length: 13.5 cm. Echogenicity within normal limits. No mass or hydronephrosis visualized. Left Kidney: Length: 12.1 cm. Echogenicity within normal limits. No mass or hydronephrosis visualized. Abdominal aorta: No aneurysm visualized. There is aortic atherosclerosis. Other findings: No demonstrable ascites. IMPRESSION: Aortic atherosclerosis.  Study otherwise unremarkable. Aortic Atherosclerosis (ICD10-I70.0). Electronically Signed   By: Lowella Grip III M.D.   On: 08/27/2017 15:06   US Pelvis Limited (transabdominal Only)  Result Date: 08/15/2017 CLINICAL DATA:  Acute kidney injury EXAM: LIMITED ULTRASOUND OF PELVIS TECHNIQUE: Limited transabdominal ultrasound examination of the pelvis was performed. COMPARISON:  Ultrasound 11/20/2010 FINDINGS: Limited sonogram of the pelvis. Foley catheter in the pelvis. The urinary bladder is empty. IMPRESSION: Foley catheter in the pelvis.  Urinary bladder is empty. Electronically Signed   By: Donavan Foil M.D.   On:  08/29/2017 15:08    PMH:   Past Medical History:  Diagnosis Date  . Asthma   . Beta thalassemia w/chronic anemia    Thalassemia  . Diabetes mellitus type 2, insulin dependent (HCC)    type 2  . Diabetic neuropathy ([place])   . Endometrial polyp   . Fibroadenoma of breast    left  . GERD (gastroesophageal reflux disease)   . Headache    migraines  . Hearing loss, right   . Hepatic steatosis   .  Hyperlipidemia   . Hypertension   . Immunocompromised state due to drug therapy   . Impingement syndrome of right shoulder   . Nontraumatic tear of right rotator cuff   . Pneumonia   . Psoriasis   . Rheumatoid arthritis(714.0)    rhematoid and osteoarthritis  . Sleep apnea   . Thyroid disease    had radiation    PSH:   Past Surgical History:  Procedure Laterality Date  . ANKLE ARTHROSCOPY Right   . CESAREAN SECTION    . COLONOSCOPY    . DILATION AND CURETTAGE OF UTERUS    . EYE SURGERY     surgery on retina  . HAND SURGERY     for arthritis  . left knee surgery nodule removal    . OTHER SURGICAL HISTORY     multiple nodule removal on neck, hands, left knee  . SHOULDER ARTHROSCOPY WITH ROTATOR CUFF REPAIR AND SUBACROMIAL DECOMPRESSION Right 05/20/2014   Procedure: RIGHT SHOULDER ARTHROSCOPY WITH DEBRIDEMENT/DISTAL CLAVICLE EXCISION/ROTATOR CUFF REPAIR AND SUBACROMIAL DECOMPRESSION;  Surgeon: Lorn Junes, MD;  Location: [facility];  Service: Orthopedics;  Laterality: Right;  . TONSILLECTOMY    . TUBAL LIGATION     bilateral    Allergies:  Allergies  Allergen Reactions  . Bactrim [Sulfamethoxazole-Trimethoprim] Hives  . Cefuroxime Axetil Itching  . Cephalosporins Itching  . Iohexol Itching and Rash     Code: RASH, Desc: HAD ITCHING AND A RASH ABOUT ONE HOUR AFTER RETURNING HOME FROM THE CT, Onset Date: [id number]   . Lisinopril Cough  . Penicillins Hives  . Sulfa Antibiotics Hives    Medications:   Prior to Admission medications   Medication Sig Start Date End Date  Taking? Authorizing Provider  abatacept (ORENCIA) 250 MG injection Inject 1,000 mg into the vein every 28 (twenty-eight) days. Monthly Infusion   Yes [provider]  albuterol (PROVENTIL HFA;VENTOLIN HFA) 108 (90 Base) MCG/ACT inhaler Inhale 2 puffs into the lungs every 6 (six) hours as needed for wheezing or shortness of breath.   Yes [provider]  amitriptyline (ELAVIL) 25 MG tablet Take 25 mg by mouth at bedtime.    Yes [provider]  ARTIFICIAL TEAR OP Place 1 drop into both eyes as needed (for dry eyes).   Yes [provider]  BYSTOLIC 10 MG tablet Take 20 mg by mouth daily.  10/07/16  Yes [provider]  chlorthalidone (HYGROTON) 25 MG tablet Take 25 mg by mouth daily.   Yes [provider]  CRESTOR 20 MG tablet Take 20 mg by mouth daily.  03/14/14  Yes [provider]  diltiazem (CARDIZEM CD) 240 MG 24 hr capsule Take 240 mg by mouth daily.  02/03/14  Yes [provider]  fluticasone (FLONASE) 50 MCG/ACT nasal spray Place 1 spray into both nostrils daily as needed for allergies or rhinitis.   Yes [provider]  gabapentin (NEURONTIN) 300 MG capsule Take 600 mg by mouth 3 (three) times daily.    Yes [provider]  HYDROcodone-acetaminophen (NORCO/VICODIN) 5-325 MG tablet Take 1 tablet by mouth every 6 (six) hours as needed for moderate pain.   Yes [provider]  insulin aspart (NOVOLOG) 100 UNIT/ML injection Inject 50 Units into the skin 3 (three) times daily before meals. 55 base Plus sliding scale    Yes [provider]  insulin NPH (HUMULIN N,NOVOLIN N) 100 UNIT/ML injection Inject 75 Units into the skin 3 (three) times daily with meals. 75 units  with breakfast; lunch 75 units; 75 units at dinner   Yes [provider]  irbesartan (AVAPRO) 300 MG tablet Take 300 mg by mouth daily.    Yes [provider]  ISOtretinoin (ACCUTANE) 40 MG capsule Take 40 mg by  mouth daily.   Yes [provider]  leflunomide (ARAVA) 20 MG tablet Take 20 mg by mouth daily.   Yes [provider]  magnesium oxide (MAG-OX) 400 MG tablet Take 400 mg by mouth daily.   Yes [provider]  meclizine (ANTIVERT) 25 MG tablet Take 25 mg by mouth 3 (three) times daily as needed for dizziness.   Yes [provider]  metFORMIN (GLUCOPHAGE-XR) 500 MG 24 hr tablet Take 500 mg by mouth every evening.   Yes [provider]  pantoprazole (PROTONIX) 40 MG tablet Take 40 mg by mouth daily.   Yes [provider]  spironolactone (ALDACTONE) 25 MG tablet Take 25 mg by mouth daily. 08/27/17  Yes [provider]  topiramate (TOPAMAX) 100 MG tablet Take 100 mg by mouth at bedtime.  07/20/16  Yes [provider]  TRULICITY 4.58 PF/2.9WK SOPN Inject 0.75 mg into the skin once a week. Patient takes on Wednesday. 08/29/15  Yes [provider]  Vitamin D, Ergocalciferol, (DRISDOL) 50000 units CAPS capsule Take 50,000 Units by mouth every 7 (seven) days.   Yes [provider]  VOLTAREN 1 % GEL Apply 1 application topically 3 (three) times daily as needed (for pain).  02/03/14  Yes [provider]  lidocaine (LIDODERM) 5 % Place 1 patch onto the skin daily. Remove & Discard patch within 12 hours or as directed by MD Patient not taking: Reported on 09/04/2017 08/27/17   Petrucelli, Glynda Jaeger, PA-C  methocarbamol (ROBAXIN) 500 MG tablet Take 2 tablets (1,000 mg total) by mouth 4 (four) times daily as needed (Pain). Patient not taking: Reported on 08/22/2017 09/04/17   Pisciotta, Elmyra Ricks, PA-C  naproxen (NAPROSYN) 500 MG tablet Take 1 tablet (500 mg total) by mouth 2 (two) times daily. Patient not taking: Reported on 08/21/2017 08/27/17   Petrucelli, Glynda Jaeger, PA-C    Discontinued Meds:   Medications Discontinued During This Encounter  Medication Reason  . vancomycin (VANCOCIN) IVPB 1000 mg/200 mL premix   .  piperacillin-tazobactam (ZOSYN) IVPB 3.375 g Change in therapy  . tiZANidine (ZANAFLEX) 4 MG tablet Patient Preference  . cyclobenzaprine (FLEXERIL) 10 MG tablet Patient Preference  . hydrogen peroxide 3 % external solution   . aztreonam (AZACTAM) 2 g in sodium chloride 0.9 % 100 mL IVPB   . metroNIDAZOLE (FLAGYL) IVPB 500 mg   . vancomycin (VANCOCIN) 1,500 mg in sodium chloride 0.9 % 500 mL IVPB   . insulin aspart (novoLOG) injection 0-15 Units   . aztreonam (AZACTAM) 0.5 g in dextrose 5 % 50 mL IVPB   . sodium polystyrene (KAYEXALATE) 15 GM/60ML suspension 15 g   . DAPTOmycin (CUBICIN) 984 mg in sodium chloride 0.9 % IVPB   . 0.9 %  sodium chloride infusion   . sodium chloride 0.9 % bolus 1,000 mL     Social History:  reports that she quit smoking about 4 years ago. Her smoking use included cigarettes. She has a 1.50 pack-year smoking history. She has never used smokeless tobacco. She reports that she does not drink alcohol or use drugs.  Family History:   Family History  Problem Relation Age of Onset  . Diabetes Mother   . Cancer Mother  breast  . Aneurysm Father        brain  . Diabetes Sister   . Cancer Brother        bone marrow cancer    Blood pressure (!) 96/52, pulse 76, temperature (!) 97.4 F (36.3 C), temperature source Oral, resp. rate 20, height '5\' 2"'$  (1.575 m), weight 124.6 kg (274 lb 11.1 oz), last menstrual period 12/27/2013, SpO2 98 %. General appearance: cooperative and appears stated age Head: Normocephalic, without obvious abnormality, atraumatic Eyes: negative Neck: no adenopathy, no carotid bruit, supple, symmetrical, trachea midline and thyroid not enlarged, symmetric, no tenderness/mass/nodules Back: tender in the lumbar spine Resp: poor air movement Chest wall: no tenderness Cardio: regular rate and rhythm, S1, S2 normal, no murmur, click, rub or gallop GI: soft, non-tender; bowel sounds normal; no masses,  no organomegaly Extremities: edema  trace Pulses: 2+ and symmetric Skin: Skin color, texture, turgor normal. No rashes or lesions Lymph nodes: Cervical, supraclavicular, and axillary nodes normal.       Dwana Melena, MD 09/07/2017, 12:10 PM

## 2017-09-07 NOTE — Progress Notes (Signed)
Patient ID: Jessica Howard, female   DOB: February 09, 1961, 57 y.o.   MRN: [id number]          [facility] for Infectious Disease  Date of Admission:  08/12/2017           Day 2 daptomycin ASSESSMENT: She remains critically ill with MSSA bacteremia, hypotension, acute renal failure and hepatopathy.  I am concerned about metastatic seeding of her joints and lumbar spine.  I will continue daptomycin for now.  Repeat blood cultures are negative at 24 hours.  TTE is pending.  She will need a lumbar MRI at some point.  PLAN: 1. Continue daptomycin 2. Consider orthopedic evaluation 3. Await results of repeat blood cultures and TTE  Principal Problem:   Bacteremia due to methicillin susceptible Staphylococcus aureus (MSSA) Active Problems:   Acute back pain   Sepsis ([place])   Acute kidney injury ([place])   Acute pain of left wrist   Great toe pain, right   Acute hearing loss of left ear   Elevated liver enzymes   Hyperbilirubinemia   Thalassemia   Rheumatoid arthritis ([place])   Morbid obesity (HCC)   Essential hypertension   Diabetes ([place])   Dyslipidemia   Immunosuppression due to drug therapy   Rheumatoid arthritis(714.0)   Hypertension   Hyperlipidemia   Diabetes mellitus, insulin dependent (IDDM), controlled (HCC)   Beta thalassemia (HCC)   Symptomatic anemia   Scheduled Meds: . enoxaparin (LOVENOX) injection  30 mg Subcutaneous Q24H  . insulin aspart  0-15 Units Subcutaneous TID WC  . mouth rinse  15 mL Mouth Rinse BID  . sodium chloride flush  3 mL Intravenous Q12H   Continuous Infusions: . [START ON 09/08/2017] DAPTOmycin (CUBICIN)  IV    . dialysate (PRISMASATE)    . dialysis replacement fluid (prismasate)    . dialysis replacement fluid (prismasate)     PRN Meds:.acetaminophen **OR** acetaminophen, heparin, morphine injection, ondansetron **OR** ondansetron (ZOFRAN) IV  Review of Systems: Review of Systems  Unable to perform ROS: Mental acuity    Allergies    Allergen Reactions  . Bactrim [Sulfamethoxazole-Trimethoprim] Hives  . Cefuroxime Axetil Itching  . Cephalosporins Itching  . Iohexol Itching and Rash     Code: RASH, Desc: HAD ITCHING AND A RASH ABOUT ONE HOUR AFTER RETURNING HOME FROM THE CT, Onset Date: [id number]   . Lisinopril Cough  . Penicillins Hives  . Sulfa Antibiotics Hives    OBJECTIVE: Vitals:   09/07/17 1030 09/07/17 1132 09/07/17 1140 09/07/17 1230  BP: (!) 96/55  (!) 96/52 (!) 92/50  Pulse: 77  76 77  Resp: (!) 21  20 (!) 23  Temp:  (!) 97.4 F (36.3 C)    TempSrc:  Oral    SpO2: 98%  98% 100%  Weight:      Height:       Body mass index is 50.24 kg/m.  Physical Exam  Constitutional:  She is more lethargic today.  She remains confused.  Her sister is at the bedside.  Cardiovascular: Normal rate and regular rhythm.  No murmur heard. Pulmonary/Chest: Effort normal. She has no wheezes. She has no rales.  Abdominal: Soft. She exhibits no distension. There is no tenderness.  Musculoskeletal: She exhibits edema and tenderness.  The diffuse swelling of her left wrist and hand and right great toe are unchanged.  Skin: No rash noted.    Lab Results Lab Results  Component Value Date   WBC 23.8 (H) 09/06/2017   HGB 8.3 (  L) 09/06/2017   HCT 25.2 (L) 09/06/2017   MCV 65.6 (L) 09/06/2017   PLT 356 09/06/2017    Lab Results  Component Value Date   CREATININE 5.92 (H) 09/06/2017   BUN 83 (H) 09/06/2017   NA 139 09/06/2017   K 5.0 09/06/2017   CL 108 09/06/2017   CO2 15 (L) 09/06/2017    Lab Results  Component Value Date   ALT 34 09/06/2017   AST 114 (H) 09/06/2017   ALKPHOS 382 (H) 09/06/2017   BILITOT 5.4 (H) 09/06/2017     Microbiology: Recent Results (from the past 240 hour(s))  Blood Culture (routine x 2)     Status: Abnormal (Preliminary result)   Collection Time: 09/01/2017  9:50 AM  Result Value Ref Range Status   Specimen Description BLOOD RIGHT HAND  Final   Special Requests   Final     BOTTLES DRAWN AEROBIC AND ANAEROBIC Blood Culture adequate volume   Culture  Setup Time   Final    GRAM POSITIVE COCCI IN BOTH AEROBIC AND ANAEROBIC BOTTLES CRITICAL RESULT CALLED TO, READ BACK BY AND VERIFIED WITH: G ABBOTT 09/06/17 0006 JDW Performed at [facility], [address], [place], [place] [address]    Culture STAPHYLOCOCCUS AUREUS (A)  Final   Report Status PENDING  Incomplete  Blood Culture (routine x 2)     Status: Abnormal (Preliminary result)   Collection Time: 08/30/2017  1:54 PM  Result Value Ref Range Status   Specimen Description BLOOD RIGHT FOREARM  Final   Special Requests   Final    BOTTLES DRAWN AEROBIC AND ANAEROBIC Blood Culture adequate volume   Culture  Setup Time   Final    GRAM POSITIVE COCCI IN BOTH AEROBIC AND ANAEROBIC BOTTLES CRITICAL RESULT CALLED TO, READ BACK BY AND VERIFIED WITH: G ABBOTT PHARMD 09/06/17 0006 JDW    Culture (A)  Final    STAPHYLOCOCCUS AUREUS SUSCEPTIBILITIES TO FOLLOW Performed at [facility], [place] [address] [place], [place] [address]    Report Status PENDING  Incomplete  Blood Culture ID Panel (Reflexed)     Status: Abnormal   Collection Time: 08/12/2017  1:54 PM  Result Value Ref Range Status   Enterococcus species NOT DETECTED NOT DETECTED Final   Listeria monocytogenes NOT DETECTED NOT DETECTED Final   Staphylococcus species DETECTED (A) NOT DETECTED Final    Comment: CRITICAL RESULT CALLED TO, READ BACK BY AND VERIFIED WITH: G ABBOTT PHARMD 09/06/17 0006 JDW    Staphylococcus aureus DETECTED (A) NOT DETECTED Final    Comment: Methicillin (oxacillin) susceptible Staphylococcus aureus (MSSA). Preferred therapy is anti staphylococcal beta lactam antibiotic (Cefazolin or Nafcillin), unless clinically contraindicated. CRITICAL RESULT CALLED TO, READ BACK BY AND VERIFIED WITH: G ABBOTT PHARMD 09/06/17 0006 JDW    Methicillin resistance NOT DETECTED NOT DETECTED Final   Streptococcus species NOT DETECTED NOT  DETECTED Final   Streptococcus agalactiae NOT DETECTED NOT DETECTED Final   Streptococcus pneumoniae NOT DETECTED NOT DETECTED Final   Streptococcus pyogenes NOT DETECTED NOT DETECTED Final   Acinetobacter baumannii NOT DETECTED NOT DETECTED Final   Enterobacteriaceae species NOT DETECTED NOT DETECTED Final   Enterobacter cloacae complex NOT DETECTED NOT DETECTED Final   Escherichia coli NOT DETECTED NOT DETECTED Final   Klebsiella oxytoca NOT DETECTED NOT DETECTED Final   Klebsiella pneumoniae NOT DETECTED NOT DETECTED Final   Proteus species NOT DETECTED NOT DETECTED Final   Serratia marcescens NOT DETECTED NOT DETECTED Final  Haemophilus influenzae NOT DETECTED NOT DETECTED Final   Neisseria meningitidis NOT DETECTED NOT DETECTED Final   Pseudomonas aeruginosa NOT DETECTED NOT DETECTED Final   Candida albicans NOT DETECTED NOT DETECTED Final   Candida glabrata NOT DETECTED NOT DETECTED Final   Candida krusei NOT DETECTED NOT DETECTED Final   Candida parapsilosis NOT DETECTED NOT DETECTED Final   Candida tropicalis NOT DETECTED NOT DETECTED Final    Comment: Performed at [facility], [place] [address], [place], [place] [address]  Urine culture     Status: Abnormal   Collection Time: 08/08/2017  3:56 PM  Result Value Ref Range Status   Specimen Description URINE, RANDOM  Final   Special Requests   Final    NONE Performed at [facility], [place] [address] [place], [place] [address]    Culture 50,000 COLONIES/mL STAPHYLOCOCCUS AUREUS (A)  Final   Report Status 09/07/2017 FINAL  Final   Organism ID, Bacteria STAPHYLOCOCCUS AUREUS (A)  Final      Susceptibility   Staphylococcus aureus - MIC*    CIPROFLOXACIN <=0.5 SENSITIVE Sensitive     GENTAMICIN <=0.5 SENSITIVE Sensitive     NITROFURANTOIN 32 SENSITIVE Sensitive     OXACILLIN 0.5 SENSITIVE Sensitive     TETRACYCLINE <=1 SENSITIVE Sensitive     VANCOMYCIN 1 SENSITIVE Sensitive     TRIMETH/SULFA <=10 SENSITIVE Sensitive      CLINDAMYCIN <=0.25 SENSITIVE Sensitive     RIFAMPIN <=0.5 SENSITIVE Sensitive     Inducible Clindamycin NEGATIVE Sensitive     * 50,000 COLONIES/mL STAPHYLOCOCCUS AUREUS  MRSA PCR Screening     Status: None   Collection Time: 09/06/17  5:20 AM  Result Value Ref Range Status   MRSA by PCR NEGATIVE NEGATIVE Final    Comment:        The GeneXpert MRSA Assay (FDA approved for NASAL specimens only), is one component of a comprehensive MRSA colonization surveillance program. It is not intended to diagnose MRSA infection nor to guide or monitor treatment for MRSA infections. Performed at [facility], [place] [address], [place], [place] [address]     Michel Bickers, [place] [facility] [phone] pager   [phone] cell 09/07/2017, 1:15 PM

## 2017-09-07 NOTE — Progress Notes (Signed)
  Echocardiogram 2D Echocardiogram has been performed.  Jessica Howard F 09/07/2017, 3:00 PM

## 2017-09-07 NOTE — Progress Notes (Signed)
I came to do the echo but a sterile procedure was in progress.

## 2017-09-07 NOTE — Procedures (Signed)
Temporary dialysis catheter placement  I was consulted solely for the placement of a temporary dialysis catheter.  Informed consent was obtained and a timeout was performed.  I attempted sterilely to cannulate the left subclavian but was unable to do so therefore I turned my attention to the right internal jugular vein.  The skin was extensively prepped with chlorhexidine and widely draped in sterile garb was donned.  The internal jugular vein was identified under ultrasound and easily cannulated.  A wire was gently passed and the skin sharply incised prior to serial dilation.  A 17 French Trialysis catheter was then placed over the wire.  Unfortunately she had numerous skin lesions that I did not want the sutures or the end of the catheter to be abutting, as they look to be infected. Therefore I advanced the catheter all the way to 19 cm from a right IJ approach.  There was excellent flow from both ports.  Chest x-ray shows no pneumothorax and a well-placed catheter that may have its tip just in the right atrium.

## 2017-09-07 NOTE — Progress Notes (Addendum)
PROGRESS NOTE    Jessica Howard  KXF:[id number] DOB: 09-Oct-1960 DOA: 08/09/2017 PCP: Merrilee Seashore, MD    Brief Narrative: 57 y.o. female with a Past Medical History significant for rheumatoid arthritis, hypertension, diabetes who presents with back pain and confusion.  Presenting with sepsis and bacteremia with MSSA Staphylococcus aureus. ID consulted and currently suspecting infection of her left wrist and right great toe as well as lumbar infection. Recommending MRI of her lumbar spine within the next few days   Assessment & Plan:   Principal Problem:  Severe Sepsis/Bacteremia due to methicillin susceptible Staphylococcus aureus (MSSA) - ID consulted and appreciate their recommendations - vancomycin changed to daptomycin. Plan is for Lumbar MRI in the next 2-3 days. TTE and ortho evaluation.  UTI - Urine cx growing staph aureus.  - ID specialist managing antibiotic regimen.     Symptomatic anemia - Most likely secondary to infectious etiology given bacteremia. Patient does not have labs consistent with hemolysis. Hematologist has assisted and I would like to thank her for her help. - Hemoglobin 8.9 should hemoglobin drops below 8 we'll plan on transfusing - reassess cbc next am.  Hyperkalemia - Pt is s/p kayexalate with resolution. Most likely 2ary to worsening renal function  Active Problems:   Thalassemia   Rheumatoid arthritis ([place]) - hold immunomodulating medications while patient has active infection    Morbid obesity ([place])   Essential hypertension   Diabetes ([place]) - place on SSI - continue diabetic diet    Dyslipidemia   Acute kidney injury ([place]) - Most likely related to sepsis - continue to monitor serum creatinine - should improve with improvement in blood pressures, resolution of sepsis, and improved oral intake.  Acute Renal injury/failure - suspect secondary to severe sepsis.  - Pt has low urine output. Will consult nephrology for further  evaluation and recommendations - Pt is starting to get more edematous, hold fluids as she is eating and not urinating much worried about patient getting fluid overloaded. Addendum: ordered urine sodium and urine creatinine    Immunosuppression due to drug therapy   Rheumatoid arthritis(714.0)   Acute pain of left wrist/Great toe pain, right - suspecting as source of infection. Continue antibiotic therapy outlined by ID    Hypertension - Patient has been having soft blood pressures secondary due to infectious etiology. Currently not on antihypertensive regimen    Hyperlipidemia   Acute hearing loss of left ear   Beta thalassemia (HCC)    elevated liver enzymes - Most likely secondary to sepsis. Will reassess CMP tomorrow    Hyperbilirubinemia   DVT prophylaxis: Lovenox Code Status: Full Family Communication: Discussed with patient and family at bedside Disposition Plan: continue monitoring in stepdown unit.    Consultants:   Discussed with oncology   Procedures: None   Antimicrobials: vancomycin and aztreonam per my discussion with pharmacy   Subjective: The patient is less confused per my discussion with nursing. Has had little to no urine output. Has been having trouble with current diet.   Objective: Vitals:   09/07/17 0700 09/07/17 0725 09/07/17 0800 09/07/17 0900  BP: (!) 93/50  (!) 109/55 (!) 103/50  Pulse: 79  79 78  Resp: 18  (!) 21 (!) 28  Temp:  98 F (36.7 C)    TempSrc:  Oral    SpO2: 99%  98% 98%  Weight:      Height:        Intake/Output Summary (Last 24 hours) at 09/07/2017 1009 Last data  filed at 09/07/2017 0900 Gross per 24 hour  Intake 3344.68 ml  Output 30 ml  Net 3314.68 ml   Filed Weights   08/22/2017 0900 09/06/17 0500 09/07/17 0500  Weight: 117.9 kg (260 lb) 123 kg (271 lb 2.7 oz) 124.6 kg (274 lb 11.1 oz)    Examination:  General exam: Awake and alert, in nad. Respiratory system: equal chest rise, no wheezes Cardiovascular  system: S1 & S2 heard, RRR. No JVD, murmurs, rubs Gastrointestinal system: Abdomen is nondistended, soft and nontender. No organomegaly or masses felt. Central nervous system: Alert and awake. No facial asymmetry. Moves extremities equally. Extremities: Warm, positive pulses Skin: No rashes, lesions or ulcers, on limited exam. Has weeping from right toe. Left wrist swelling Psychiatry:  Mood & affect appropriate.     Data Reviewed: I have personally reviewed following labs and imaging studies  CBC: Recent Labs  Lab 09/02/2017 0632 09/06/17 0310 09/06/17 2349  WBC 25.1* 24.0* 23.8*  NEUTROABS 22.1*  --   --   HGB 7.6* 8.9* 8.3*  HCT 23.3* 27.5* 25.2*  MCV 61.0* 65.0* 65.6*  PLT 423* 439* 195   Basic Metabolic Panel: Recent Labs  Lab 09/02/2017 0632 09/01/2017 1531 08/15/2017 2001 09/06/17 0310 09/06/17 2349  NA 132* 136 135 137 139  K 5.1 5.6* 6.0* 5.6* 5.0  CL 97* 103 104 105 108  CO2 18* 18* 15* 16* 15*  GLUCOSE 337* 298* 275* 190* 139*  BUN 69* 69* 69* 74* 83*  CREATININE 3.36* 3.75* 3.91* 4.34* 5.92*  CALCIUM 7.6* 7.0* 7.0* 7.1* 6.5*  MG 2.3  --   --   --   --   PHOS 5.9*  --   --   --   --    GFR: Estimated Creatinine Clearance: 13.5 mL/min (A) (by C-G formula based on SCr of 5.92 mg/dL (H)). Liver Function Tests: Recent Labs  Lab 08/19/2017 0632 08/19/2017 1531 09/06/2017 2001 09/06/17 0310 09/06/17 2349  AST 53* 81* 87* 89* 114*  ALT 24 32 34 32 34  ALKPHOS 294* 335* 354* 343* 382*  BILITOT 4.1* 5.4* 6.2* 5.9* 5.4*  PROT 6.5 6.4* 6.8 6.8 6.2*  ALBUMIN 2.0* 1.8* 2.1* 2.0* 1.7*   Recent Labs  Lab 09/04/2017 0815  LIPASE 28   Recent Labs  Lab 08/22/2017 0956 09/06/17 0310  AMMONIA 38* 101*   Coagulation Profile: Recent Labs  Lab 08/18/2017 1531  INR 1.45   Cardiac Enzymes: Recent Labs  Lab 08/17/2017 0632 09/06/17 1030  CKTOTAL 55 233   BNP (last 3 results) No results for input(s): PROBNP in the last 8760 hours. HbA1C: No results for input(s):  HGBA1C in the last 72 hours. CBG: Recent Labs  Lab 09/06/17 0722 09/06/17 1150 09/06/17 1536 09/06/17 1935 09/07/17 0727  GLUCAP 145* 160* 169* 129* 111*   Lipid Profile: No results for input(s): CHOL, HDL, LDLCALC, TRIG, CHOLHDL, LDLDIRECT in the last 72 hours. Thyroid Function Tests: Recent Labs    09/04/2017 1531  TSH 0.822   Anemia Panel: Recent Labs    08/22/2017 1531  VITAMINB12 429  FOLATE 6.0  FERRITIN 4,025*  TIBC 136*  IRON 33  RETICCTPCT 0.7   Sepsis Labs: Recent Labs  Lab 08/11/2017 0806 09/01/2017 1006 08/18/2017 1526 08/20/2017 1531 08/21/2017 2001 09/06/17 0310 09/06/17 2349  PROCALCITON  --   --   --  11.11  --  18.40 20.69  LATICACIDVEN 2.58* 2.59* 2.1*  --  1.9  --   --     Recent  Results (from the past 240 hour(s))  Blood Culture (routine x 2)     Status: Abnormal (Preliminary result)   Collection Time: 09/04/2017  9:50 AM  Result Value Ref Range Status   Specimen Description BLOOD RIGHT HAND  Final   Special Requests   Final    BOTTLES DRAWN AEROBIC AND ANAEROBIC Blood Culture adequate volume   Culture  Setup Time   Final    GRAM POSITIVE COCCI IN BOTH AEROBIC AND ANAEROBIC BOTTLES CRITICAL RESULT CALLED TO, READ BACK BY AND VERIFIED WITH: G ABBOTT 09/06/17 0006 JDW Performed at [facility], [address] [place], [place] [address]    Culture STAPHYLOCOCCUS AUREUS (A)  Final   Report Status PENDING  Incomplete  Blood Culture (routine x 2)     Status: Abnormal (Preliminary result)   Collection Time: 08/20/2017  1:54 PM  Result Value Ref Range Status   Specimen Description BLOOD RIGHT FOREARM  Final   Special Requests   Final    BOTTLES DRAWN AEROBIC AND ANAEROBIC Blood Culture adequate volume   Culture  Setup Time   Final    GRAM POSITIVE COCCI IN BOTH AEROBIC AND ANAEROBIC BOTTLES CRITICAL RESULT CALLED TO, READ BACK BY AND VERIFIED WITH: G ABBOTT PHARMD 09/06/17 0006 JDW    Culture (A)  Final    STAPHYLOCOCCUS AUREUS SUSCEPTIBILITIES  TO FOLLOW Performed at [facility], [place] [address], [place], [place] [address]    Report Status PENDING  Incomplete  Blood Culture ID Panel (Reflexed)     Status: Abnormal   Collection Time: 08/19/2017  1:54 PM  Result Value Ref Range Status   Enterococcus species NOT DETECTED NOT DETECTED Final   Listeria monocytogenes NOT DETECTED NOT DETECTED Final   Staphylococcus species DETECTED (A) NOT DETECTED Final    Comment: CRITICAL RESULT CALLED TO, READ BACK BY AND VERIFIED WITH: G ABBOTT PHARMD 09/06/17 0006 JDW    Staphylococcus aureus DETECTED (A) NOT DETECTED Final    Comment: Methicillin (oxacillin) susceptible Staphylococcus aureus (MSSA). Preferred therapy is anti staphylococcal beta lactam antibiotic (Cefazolin or Nafcillin), unless clinically contraindicated. CRITICAL RESULT CALLED TO, READ BACK BY AND VERIFIED WITH: G ABBOTT PHARMD 09/06/17 0006 JDW    Methicillin resistance NOT DETECTED NOT DETECTED Final   Streptococcus species NOT DETECTED NOT DETECTED Final   Streptococcus agalactiae NOT DETECTED NOT DETECTED Final   Streptococcus pneumoniae NOT DETECTED NOT DETECTED Final   Streptococcus pyogenes NOT DETECTED NOT DETECTED Final   Acinetobacter baumannii NOT DETECTED NOT DETECTED Final   Enterobacteriaceae species NOT DETECTED NOT DETECTED Final   Enterobacter cloacae complex NOT DETECTED NOT DETECTED Final   Escherichia coli NOT DETECTED NOT DETECTED Final   Klebsiella oxytoca NOT DETECTED NOT DETECTED Final   Klebsiella pneumoniae NOT DETECTED NOT DETECTED Final   Proteus species NOT DETECTED NOT DETECTED Final   Serratia marcescens NOT DETECTED NOT DETECTED Final   Haemophilus influenzae NOT DETECTED NOT DETECTED Final   Neisseria meningitidis NOT DETECTED NOT DETECTED Final   Pseudomonas aeruginosa NOT DETECTED NOT DETECTED Final   Candida albicans NOT DETECTED NOT DETECTED Final   Candida glabrata NOT DETECTED NOT DETECTED Final   Candida krusei NOT DETECTED NOT  DETECTED Final   Candida parapsilosis NOT DETECTED NOT DETECTED Final   Candida tropicalis NOT DETECTED NOT DETECTED Final    Comment: Performed at [facility], [place] [address], [place], [place] [address]  Urine culture     Status: Abnormal  Collection Time: 08/16/2017  3:56 PM  Result Value Ref Range Status   Specimen Description URINE, RANDOM  Final   Special Requests   Final    NONE Performed at [facility], [place] [address], [place], [place] [address]    Culture 50,000 COLONIES/mL STAPHYLOCOCCUS AUREUS (A)  Final   Report Status 09/07/2017 FINAL  Final   Organism ID, Bacteria STAPHYLOCOCCUS AUREUS (A)  Final      Susceptibility   Staphylococcus aureus - MIC*    CIPROFLOXACIN <=0.5 SENSITIVE Sensitive     GENTAMICIN <=0.5 SENSITIVE Sensitive     NITROFURANTOIN 32 SENSITIVE Sensitive     OXACILLIN 0.5 SENSITIVE Sensitive     TETRACYCLINE <=1 SENSITIVE Sensitive     VANCOMYCIN 1 SENSITIVE Sensitive     TRIMETH/SULFA <=10 SENSITIVE Sensitive     CLINDAMYCIN <=0.25 SENSITIVE Sensitive     RIFAMPIN <=0.5 SENSITIVE Sensitive     Inducible Clindamycin NEGATIVE Sensitive     * 50,000 COLONIES/mL STAPHYLOCOCCUS AUREUS  MRSA PCR Screening     Status: None   Collection Time: 09/06/17  5:20 AM  Result Value Ref Range Status   MRSA by PCR NEGATIVE NEGATIVE Final    Comment:        The GeneXpert MRSA Assay (FDA approved for NASAL specimens only), is one component of a comprehensive MRSA colonization surveillance program. It is not intended to diagnose MRSA infection nor to guide or monitor treatment for MRSA infections. Performed at [facility], [place] [address] [place], [place] [address]      Radiology Studies: Dg Wrist Complete Left  Result Date: 08/20/2017 CLINICAL DATA:  Chronic low back pain.  Body aches. EXAM: LEFT WRIST - COMPLETE 3+ VIEW COMPARISON:  No recent. FINDINGS: Diffuse severe degenerative change left wrist. Carpal cysts noted most likely  degenerative. Probable postsurgical changes of the trapezium. No evidence of acute fracture or dislocation. IMPRESSION: Diffuse severe degenerative change left wrist. Probable postsurgical changes of the trapezium. No acute abnormality identified. Electronically Signed   By: Marcello Moores  Register   On: 08/26/2017 10:54   Ct Head Wo Contrast  Result Date: 08/18/2017 CLINICAL DATA:  Altered level of consciousness. EXAM: CT HEAD WITHOUT CONTRAST TECHNIQUE: Contiguous axial images were obtained from the base of the skull through the vertex without intravenous contrast. COMPARISON:  01/17/2017 FINDINGS: Brain: No acute intracranial abnormality. Specifically, no hemorrhage, hydrocephalus, mass lesion, acute infarction, or significant intracranial injury. Vascular: No hyperdense vessel or unexpected calcification. Skull: No acute calvarial abnormality. Sinuses/Orbits: Visualized paranasal sinuses and mastoids clear. Orbital soft tissues unremarkable. Other: None IMPRESSION: Normal study. Electronically Signed   By: Rolm Baptise M.D.   On: 08/15/2017 10:32   US Abdomen Complete  Result Date: 08/11/2017 CLINICAL DATA:  Elevated liver enzymes EXAM: ABDOMEN ULTRASOUND COMPLETE COMPARISON:  None. FINDINGS: Gallbladder: No gallstones or wall thickening visualized. No pericholecystic fluid. No sonographic Murphy sign noted by sonographer. Common bile duct: Diameter: 2 mm. No intrahepatic, common hepatic, or common bile duct dilatation. Liver: No focal lesion identified. Within normal limits in parenchymal echogenicity. Portal vein is patent on color Doppler imaging with normal direction of blood flow towards the liver. IVC: No abnormality visualized. Pancreas: No pancreatic mass or inflammatory focus. Spleen: Size and appearance within normal limits. Right Kidney: Length: 13.5 cm. Echogenicity within normal limits. No mass or hydronephrosis visualized. Left Kidney: Length: 12.1 cm. Echogenicity within normal limits. No mass or  hydronephrosis visualized. Abdominal aorta: No aneurysm visualized. There is aortic  atherosclerosis. Other findings: No demonstrable ascites. IMPRESSION: Aortic atherosclerosis.  Study otherwise unremarkable. Aortic Atherosclerosis (ICD10-I70.0). Electronically Signed   By: Lowella Grip III M.D.   On: 08/19/2017 15:06   US Pelvis Limited (transabdominal Only)  Result Date: 08/18/2017 CLINICAL DATA:  Acute kidney injury EXAM: LIMITED ULTRASOUND OF PELVIS TECHNIQUE: Limited transabdominal ultrasound examination of the pelvis was performed. COMPARISON:  Ultrasound 11/20/2010 FINDINGS: Limited sonogram of the pelvis. Foley catheter in the pelvis. The urinary bladder is empty. IMPRESSION: Foley catheter in the pelvis.  Urinary bladder is empty. Electronically Signed   By: Donavan Foil M.D.   On: 08/25/2017 15:08   Dg Foot Complete Right  Result Date: 08/27/2017 CLINICAL DATA:  Right foot pain. Rule out osteomyelitis of great toe. EXAM: RIGHT FOOT COMPLETE - 3+ VIEW COMPARISON:  09/13/2016 FINDINGS: No bony destructive changes noted to suggest osteomyelitis. Degenerative changes at the great toe IP joint. Mild soft tissue swelling along the dorsum of the foot. IMPRESSION: No visible radiographic changes of osteomyelitis. No acute bony abnormality. Electronically Signed   By: Rolm Baptise M.D.   On: 08/30/2017 10:51   Scheduled Meds: . enoxaparin (LOVENOX) injection  30 mg Subcutaneous Q24H  . insulin aspart  0-15 Units Subcutaneous TID WC  . mouth rinse  15 mL Mouth Rinse BID  . sodium chloride flush  3 mL Intravenous Q12H   Continuous Infusions: . sodium chloride 125 mL/hr at 09/07/17 0400  . DAPTOmycin (CUBICIN)  IV Stopped (09/06/17 1536)  . sodium chloride       LOS: 2 days    Time spent:  35 minutes  Velvet Bathe, MD [facility] Pager [phone]  If 7PM-7AM, please contact night-coverage [email or web address] Password [email or web address] 09/07/2017, 10:09 AM

## 2017-09-08 ENCOUNTER — Inpatient Hospital Stay (HOSPITAL_COMMUNITY): Payer: Medicare Other

## 2017-09-08 DIAGNOSIS — M25539 Pain in unspecified wrist: Secondary | ICD-10-CM

## 2017-09-08 DIAGNOSIS — M79676 Pain in unspecified toe(s): Secondary | ICD-10-CM

## 2017-09-08 DIAGNOSIS — R6521 Severe sepsis with septic shock: Secondary | ICD-10-CM

## 2017-09-08 DIAGNOSIS — L608 Other nail disorders: Secondary | ICD-10-CM

## 2017-09-08 DIAGNOSIS — L814 Other melanin hyperpigmentation: Secondary | ICD-10-CM

## 2017-09-08 LAB — RENAL FUNCTION PANEL
ANION GAP: 16 — AB (ref 5–15)
Albumin: 1.6 g/dL — ABNORMAL LOW (ref 3.5–5.0)
BUN: 55 mg/dL — ABNORMAL HIGH (ref 6–20)
CHLORIDE: 102 mmol/L (ref 101–111)
CO2: 20 mmol/L — AB (ref 22–32)
Calcium: 7 mg/dL — ABNORMAL LOW (ref 8.9–10.3)
Creatinine, Ser: 4.28 mg/dL — ABNORMAL HIGH (ref 0.44–1.00)
GFR, EST AFRICAN AMERICAN: 12 mL/min — AB (ref >60)
GFR, EST NON AFRICAN AMERICAN: 11 mL/min — AB (ref >60)
Glucose, Bld: 75 mg/dL (ref 65–99)
Phosphorus: 5.6 mg/dL — ABNORMAL HIGH (ref 2.5–4.6)
Potassium: 4.2 mmol/L (ref 3.5–5.1)
SODIUM: 138 mmol/L (ref 135–145)

## 2017-09-08 LAB — CBC WITH DIFFERENTIAL/PLATELET
BASOS ABS: 0 10*3/uL (ref 0.0–0.1)
Basophils Relative: 0 %
Eosinophils Absolute: 0 10*3/uL (ref 0.0–0.7)
Eosinophils Relative: 0 %
HCT: 25.7 % — ABNORMAL LOW (ref 36.0–46.0)
Hemoglobin: 8.1 g/dL — ABNORMAL LOW (ref 12.0–15.0)
LYMPHS ABS: 1.2 10*3/uL (ref 0.7–4.0)
Lymphocytes Relative: 5 %
MCH: 21 pg — ABNORMAL LOW (ref 26.0–34.0)
MCHC: 31.5 g/dL (ref 30.0–36.0)
MCV: 66.6 fL — ABNORMAL LOW (ref 78.0–100.0)
MONO ABS: 1.2 10*3/uL — AB (ref 0.1–1.0)
Monocytes Relative: 5 %
NEUTROS PCT: 90 %
Neutro Abs: 21.7 10*3/uL — ABNORMAL HIGH (ref 1.7–7.7)
PLATELETS: 344 10*3/uL (ref 150–400)
RBC: 3.86 MIL/uL — AB (ref 3.87–5.11)
RDW: 22.8 % — AB (ref 11.5–15.5)
Smear Review: ADEQUATE
WBC: 24.1 10*3/uL — AB (ref 4.0–10.5)

## 2017-09-08 LAB — MAGNESIUM: MAGNESIUM: 2.4 mg/dL (ref 1.7–2.4)

## 2017-09-08 LAB — CULTURE, BLOOD (ROUTINE X 2)
SPECIAL REQUESTS: ADEQUATE
Special Requests: ADEQUATE

## 2017-09-08 LAB — GLUCOSE, CAPILLARY
GLUCOSE-CAPILLARY: 102 mg/dL — AB (ref 65–99)
GLUCOSE-CAPILLARY: 121 mg/dL — AB (ref 65–99)
GLUCOSE-CAPILLARY: 71 mg/dL (ref 65–99)
GLUCOSE-CAPILLARY: 78 mg/dL (ref 65–99)
Glucose-Capillary: 69 mg/dL (ref 65–99)
Glucose-Capillary: 81 mg/dL (ref 65–99)
Glucose-Capillary: 83 mg/dL (ref 65–99)
Glucose-Capillary: 123 mg/dL — ABNORMAL HIGH (ref 65–99)

## 2017-09-08 MED ORDER — CHLORHEXIDINE GLUCONATE CLOTH 2 % EX PADS
6.0000 | MEDICATED_PAD | Freq: Every day | CUTANEOUS | Status: DC
  Administered 2017-09-08 – 2017-09-22 (×14): 6 via TOPICAL

## 2017-09-08 MED ORDER — AMIODARONE HCL IN DEXTROSE 360-4.14 MG/200ML-% IV SOLN
30.0000 mg/h | INTRAVENOUS | Status: DC
  Administered 2017-09-09 – 2017-09-15 (×13): 30 mg/h via INTRAVENOUS
  Filled 2017-09-08 (×27): qty 200

## 2017-09-08 MED ORDER — DEXTROSE 50 % IV SOLN
25.0000 mL | Freq: Once | INTRAVENOUS | Status: AC
  Administered 2017-09-08: 25 mL via INTRAVENOUS
  Filled 2017-09-08: qty 50

## 2017-09-08 MED ORDER — SODIUM CHLORIDE 0.9 % IV SOLN
INTRAVENOUS | Status: DC
Start: 2017-09-08 — End: 2017-09-23
  Administered 2017-09-08 – 2017-09-20 (×11): via INTRAVENOUS

## 2017-09-08 MED ORDER — AMIODARONE IV BOLUS ONLY 150 MG/100ML
150.0000 mg | Freq: Once | INTRAVENOUS | Status: AC
  Administered 2017-09-08: 75 mg via INTRAVENOUS
  Filled 2017-09-08: qty 100

## 2017-09-08 MED ORDER — DEXTROSE 50 % IV SOLN
INTRAVENOUS | Status: AC
  Filled 2017-09-08: qty 50

## 2017-09-08 MED ORDER — DEXTROSE-NACL 5-0.9 % IV SOLN
INTRAVENOUS | Status: DC
  Administered 2017-09-08 – 2017-09-10 (×3): via INTRAVENOUS

## 2017-09-08 MED ORDER — AMIODARONE HCL IN DEXTROSE 360-4.14 MG/200ML-% IV SOLN
INTRAVENOUS | Status: AC
  Filled 2017-09-08: qty 200

## 2017-09-08 MED ORDER — AMIODARONE HCL IN DEXTROSE 360-4.14 MG/200ML-% IV SOLN
60.0000 mg/h | INTRAVENOUS | Status: AC
  Administered 2017-09-08: 60 mg/h via INTRAVENOUS
  Filled 2017-09-08: qty 200

## 2017-09-08 NOTE — Evaluation (Signed)
Clinical/Bedside Swallow Evaluation Patient Details  Name: Jessica Howard MRN: [id number] Date of Birth: 1961-05-24  Today's Date: 09/08/2017 Time: SLP Start Time (ACUTE ONLY): 1308 SLP Stop Time (ACUTE ONLY): 0946 SLP Time Calculation (min) (ACUTE ONLY): 23 min  Past Medical History:  Past Medical History:  Diagnosis Date  . Asthma   . Beta thalassemia w/chronic anemia    Thalassemia  . Diabetes mellitus type 2, insulin dependent (HCC)    type 2  . Diabetic neuropathy ([place])   . Endometrial polyp   . Fibroadenoma of breast    left  . GERD (gastroesophageal reflux disease)   . Headache    migraines  . Hearing loss, right   . Hepatic steatosis   . Hyperlipidemia   . Hypertension   . Immunocompromised state due to drug therapy   . Impingement syndrome of right shoulder   . Nontraumatic tear of right rotator cuff   . Pneumonia   . Psoriasis   . Rheumatoid arthritis(714.0)    rhematoid and osteoarthritis  . Sleep apnea   . Thyroid disease    had radiation   Past Surgical History:  Past Surgical History:  Procedure Laterality Date  . ANKLE ARTHROSCOPY Right   . CESAREAN SECTION    . COLONOSCOPY    . DILATION AND CURETTAGE OF UTERUS    . EYE SURGERY     surgery on retina  . HAND SURGERY     for arthritis  . left knee surgery nodule removal    . OTHER SURGICAL HISTORY     multiple nodule removal on neck, hands, left knee  . SHOULDER ARTHROSCOPY WITH ROTATOR CUFF REPAIR AND SUBACROMIAL DECOMPRESSION Right 05/20/2014   Procedure: RIGHT SHOULDER ARTHROSCOPY WITH DEBRIDEMENT/DISTAL CLAVICLE EXCISION/ROTATOR CUFF REPAIR AND SUBACROMIAL DECOMPRESSION;  Surgeon: Lorn Junes, MD;  Location: [facility];  Service: Orthopedics;  Laterality: Right;  . TONSILLECTOMY    . TUBAL LIGATION     bilateral   HPI:  Pt presents with MSSA bacteremia, hypotension, acute renal failure and hepatopathy. PMHx includes PNA, GERD, Rheumatoid Arthritis, and asthma. Currently on CRRT. CXR  (3/31)was negative for pneumothorax with no concern for consolidation. Pt currently NPO following witnessed coughing with dinner tray. CT Head (3/29) revealed no acute intracranial abnormality.   Assessment / Plan / Recommendation Clinical Impression   Pt presents with no observed signs concerning for aspiration with PO trials; however, pt's family and nursing report coughing with meals yesterday. Of note, pt had persistent grunting following swallows with thin liquids, but family reports that is consistent with pt's baseline behaviors. Min oral deficits observed wiith solid trials (delayed transit, increased mastication time) likely secondary to pt's complaint of odynophagia. Pt was able to independently clear her oral cavity despite delay. Family and pt were educated on need for aspiration precautions to ensure safety with oral intake and solid consistency options. Recommend pt initiate Dys 2 and thin liquid diet with aspiration precautions (ensuring that pt is alert/awake, sitting upright, slow/controlled intake) with full supervision. Pt is likely to advance solid consistency with improved alertness and comfort with swallowing.       SLP Visit Diagnosis: Dysphagia, unspecified (R13.10)    Aspiration Risk  Mild aspiration risk    Diet Recommendation Dysphagia 2 (Fine chop);Thin liquid   Liquid Administration via: Cup;Straw Medication Administration: Whole meds with liquid Supervision: Patient able to self feed;Full supervision/cueing for compensatory strategies Compensations: Minimize environmental distractions;Slow rate;Small sips/bites Postural Changes: Seated upright at 90 degrees;Remain upright for at  least 30 minutes after po intake    Other  Recommendations Oral Care Recommendations: Oral care BID   Follow up Recommendations None      Frequency and Duration min 2x/week  2 weeks       Prognosis Prognosis for Safe Diet Advancement: Good      Swallow Study   General HPI: Pt  presents with MSSA bacteremia, hypotension, acute renal failure and hepatopathy. PMHx includes PNA, GERD, Rheumatoid Arthritis, and asthma. Currently on CRRT. CXR (3/31)was negative for pneumothorax with no concern for consolidation. Pt currently NPO following witnessed coughing with dinner tray. CT Head (3/29) revealed no acute intracranial abnormality. Type of Study: Bedside Swallow Evaluation Previous Swallow Assessment: none in chart Diet Prior to this Study: NPO Temperature Spikes Noted: No Respiratory Status: Nasal cannula History of Recent Intubation: No Behavior/Cognition: Alert;Cooperative;Pleasant mood Oral Cavity Assessment: Within Functional Limits Oral Care Completed by SLP: Yes Oral Cavity - Dentition: Adequate natural dentition Vision: Functional for self-feeding Self-Feeding Abilities: Able to feed self;Needs assist Patient Positioning: Upright in bed Baseline Vocal Quality: Normal Volitional Cough: Weak    Oral/Motor/Sensory Function Overall Oral Motor/Sensory Function: Within functional limits   Ice Chips Ice chips: Within functional limits Presentation: Spoon   Thin Liquid Thin Liquid: Impaired Presentation: Cup;Straw;Self Fed Oral Phase Functional Implications: Oral holding    Nectar Thick Nectar Thick Liquid: Not tested   Honey Thick Honey Thick Liquid: Not tested   Puree Puree: Impaired Presentation: Spoon Oral Phase Functional Implications: Right anterior spillage;Prolonged oral transit   Solid   GO   [place] Aaryn Sermon SLP Student Clinician  Solid: Impaired Presentation: Self Fed Oral Phase Impairments: Other (comment)(small bites, increased manipulation time) Oral Phase Functional Implications: Prolonged oral transit        [place] Dyann Goodspeed 09/08/2017,10:02 AM

## 2017-09-08 NOTE — Progress Notes (Signed)
S: Feeling better and tolerating CVVHD but c/o wrist, back and right great toe pain O:BP 133/74   Pulse 79   Temp (!) 97.4 F (36.3 C) (Oral)   Resp 13   Ht 5\' 2"  (1.575 m)   Wt 123 kg (271 lb 2.7 oz)   LMP 12/27/2013 Comment: irreggular  SpO2 96%   BMI 49.60 kg/m   Intake/Output Summary (Last 24 hours) at 09/08/2017 1155 Last data filed at 09/08/2017 1100 Gross per 24 hour  Intake 355 ml  Output 271 ml  Net 84 ml   Intake/Output: I/O last 3 completed shifts: In: 2548.8 [I.V.:2528.8; Other:20] Out: 190 [Urine:35; Other:155]  Intake/Output this shift:  Total I/O In: 152 [I.V.:142; Other:10] Out: 91 [Urine:8; Other:83] Weight change: -1.6 kg (-3 lb 8.4 oz) Gen: NAD CVS: RRR, no rub or murmer appreciated Resp: CTA Abd: benign Ext: right great toe swollen and tender, osler's node on right pointer finger.   Recent Labs  Lab 08/19/2017 0632 08/31/2017 1531 09/04/2017 2001 09/06/17 0310 09/06/17 2349 09/07/17 1649 09/08/17 0543  NA 132* 136 135 137 139 136 138  K 5.1 5.6* 6.0* 5.6* 5.0 5.2* 4.2  CL 97* 103 104 105 108 106 102  CO2 18* 18* 15* 16* 15* 16* 20*  GLUCOSE 337* 298* 275* 190* 139* 91 75  BUN 69* 69* 69* 74* 83* 84* 55*  CREATININE 3.36* 3.75* 3.91* 4.34* 5.92* 6.65* 4.28*  ALBUMIN 2.0* 1.8* 2.1* 2.0* 1.7* 1.6* 1.6*  CALCIUM 7.6* 7.0* 7.0* 7.1* 6.5* 6.3* 7.0*  PHOS 5.9*  --   --   --   --  8.1* 5.6*  AST 53* 81* 87* 89* 114*  --   --   ALT 24 32 34 32 34  --   --    Liver Function Tests: Recent Labs  Lab 09/04/2017 2001 09/06/17 0310 09/06/17 2349 09/07/17 1649 09/08/17 0543  AST 87* 89* 114*  --   --   ALT 34 32 34  --   --   ALKPHOS 354* 343* 382*  --   --   BILITOT 6.2* 5.9* 5.4*  --   --   PROT 6.8 6.8 6.2*  --   --   ALBUMIN 2.1* 2.0* 1.7* 1.6* 1.6*   Recent Labs  Lab 08/17/2017 0815  LIPASE 28   Recent Labs  Lab 09/04/2017 0956 09/06/17 0310  AMMONIA 38* 101*   CBC: Recent Labs  Lab 08/10/2017 0632 09/06/17 0310 09/06/17 2349  WBC 25.1*  24.0* 23.8*  NEUTROABS 22.1*  --   --   HGB 7.6* 8.9* 8.3*  HCT 23.3* 27.5* 25.2*  MCV 61.0* 65.0* 65.6*  PLT 423* 439* 356   Cardiac Enzymes: Recent Labs  Lab 08/11/2017 0632 09/06/17 1030  CKTOTAL 55 233   CBG: Recent Labs  Lab 09/08/17 0004 09/08/17 0427 09/08/17 0720 09/08/17 0801 09/08/17 1118  GLUCAP 78 71 69 102* 81    Iron Studies:  Recent Labs    09/02/2017 1531  IRON 33  TIBC 136*  FERRITIN 4,025*   Studies/Results: Dg Chest Port 1 View  Result Date: 09/07/2017 CLINICAL DATA:  Central line placement. EXAM: PORTABLE CHEST 1 VIEW COMPARISON:  08/19/2017 and prior radiographs FINDINGS: This is a mildly low volume film. A RIGHT IJ central venous catheter is noted with tip overlying the UPPER RIGHT atrium. There is no evidence of pneumothorax. Cardiomegaly and pulmonary vascular congestion again noted. IMPRESSION: RIGHT IJ central venous catheter with tip overlying the UPPER RIGHT atrium, in  this low volume film. No pneumothorax. Cardiomegaly and pulmonary vascular congestion. Electronically Signed   By: Margarette Canada M.D.   On: 09/07/2017 14:34   . Chlorhexidine Gluconate Cloth  6 each Topical Daily  . enoxaparin (LOVENOX) injection  30 mg Subcutaneous Q24H  . insulin aspart  0-15 Units Subcutaneous TID WC  . mouth rinse  15 mL Mouth Rinse BID  . sodium chloride flush  3 mL Intravenous Q12H    BMET    Component Value Date/Time   NA 138 09/08/2017 0543   NA 142 03/06/2017 0929   K 4.2 09/08/2017 0543   K 4.1 03/06/2017 0929   CL 102 09/08/2017 0543   CL 101 02/13/2012 0850   CO2 20 (L) 09/08/2017 0543   CO2 23 03/06/2017 0929   GLUCOSE 75 09/08/2017 0543   GLUCOSE 129 03/06/2017 0929   GLUCOSE 529 (H) 02/13/2012 0850   BUN 55 (H) 09/08/2017 0543   BUN 17.1 03/06/2017 0929   CREATININE 4.28 (H) 09/08/2017 0543   CREATININE 0.8 03/06/2017 0929   CALCIUM 7.0 (L) 09/08/2017 0543   CALCIUM 9.6 03/06/2017 0929   GFRNONAA 11 (L) 09/08/2017 0543   GFRAA 12 (L)  09/08/2017 0543   CBC    Component Value Date/Time   WBC 23.8 (H) 09/06/2017 2349   RBC 3.84 (L) 09/06/2017 2349   HGB 8.3 (L) 09/06/2017 2349   HGB 9.4 (L) 05/16/2017 0937   HCT 25.2 (L) 09/06/2017 2349   HCT 30.4 (L) 05/16/2017 0937   PLT 356 09/06/2017 2349   PLT 266 05/16/2017 0937   MCV 65.6 (L) 09/06/2017 2349   MCV 64.4 (L) 05/16/2017 0937   MCH 21.6 (L) 09/06/2017 2349   MCHC 32.9 09/06/2017 2349   RDW 22.1 (H) 09/06/2017 2349   RDW 17.4 (H) 05/16/2017 0937   LYMPHSABS 2.0 09/06/2017 0632   LYMPHSABS 3.1 05/16/2017 0937   MONOABS 1.0 08/27/2017 0632   MONOABS 0.8 05/16/2017 0937   EOSABS 0.0 08/20/2017 0632   EOSABS 0.1 05/16/2017 0937   BASOSABS 0.0 08/25/2017 0632   BASOSABS 0.0 05/16/2017 0937   Brief HPI:  SHERRA KIMMONS is an 57 y.o. female with RA on immunosuppressives, chronic pain treated with narcotics, hypertension, DM presenting with worsening back pain for 2 weeks. She had pain in the right GT and left hand as well and presented to the ED with confusion, very dark urine, discoloration of the rt GT, anorexia and was found to be in AKI with an initial ED BUN/Cr of 69/3.36 which has steadily worsened. She has gotten 10L of isotonic fluid and yet her renal function has continued to worsen w/ hypotension noted and poor uop. Her lactate and procalcitonin were both elevated. Her Cr which was 0.8 03/06/2017 increased to 3.36 on 3/29 and has steadily increased to 5.92 with a marked decrease in UOP.  She was started on CVVHD on 09/07/17.    Assessment/Plan:  1. ARF, oliguric- presumably due to ischemic ATN in setting of sepsis and hypotension, however pt with evidence of disseminated MSSA septicemia with osler's node and splinter hemorrhages.  Started on CVVHD 09/07/17 and tolerating it well.  No urine output.  Continue to follow 2. MSSA septicemia- concerning for metastatic seeding of joints/spine.  ID following and TTE ordered and was negative for vegetation and normal EF  and valvular function. On Daptomycin 3. Abnormal LFT's- ?shock liver or direct involvement of infection.  Liver US negative.  Will need to recheck liver tests in am.  4. Anemia of acute illness- follow and transfuse prn 5. Vascular access- RIJ tdc placed 09/07/17 6. Rheumatoid arthritis 7. Thalassemia 8. Hypoalbuminemia- due to sepsis 9. Disposition- hopefully will remain stable and can transition to IHD if no improvement in UOP.  Donetta Potts, MD [employer] [phone]

## 2017-09-08 NOTE — Progress Notes (Signed)
Dr Mardelle Matte- Ortho paged regarding order for Fluoro guided aspiration of L wrist that was ordered today. Was told by day nurse to hold Lovenox for procedure tomorrow however, no orders or mention of this found. Instructed by Dr Mardelle Matte to hold Lovenox for now and try to follow up with IR regarding time of procedure and when to give Lovenox. Also informed Md that pt was on CRRT and questioned if she would be taken off to go to IR tomorrow. He was unsure at this time. Will hold Lovenox for now, and continue to try and get a hold of IR for further instructions.

## 2017-09-08 NOTE — Progress Notes (Signed)
Hypoglycemic Event  CBG: 69  Treatment: D50 IV 25 mL  Symptoms: None  Follow-up CBG: Time: 0801 CBG Result:102  Possible Reasons for Event: Inadequate meal intake  Comments/MD notified: Dr. Wendee Beavers notified. Orders for Dextrose 5% with 0.9% normal saline ordered at 18mL per hour     Blair Heys

## 2017-09-08 NOTE — Progress Notes (Signed)
Paged that patient is unable to tolerate BP cuff to obtain ABIs due to severe pain. We will attempt to control the patient's pain better and reorder when patient can tolerate it. Will need ABIs to assess vasculature with right foot wound.

## 2017-09-08 NOTE — Consult Note (Signed)
PULMONARY / CRITICAL CARE MEDICINE   Name: Jessica Howard MRN: [id number] DOB: [birth year]    ADMISSION DATE:  08/22/2017 CONSULTATION DATE:  09/08/2017  REFERRING MD:  Velvet Bathe MD  CHIEF COMPLAINT: Back pain   HISTORY OF PRESENT ILLNESS:   Jessica Howard is a 57 y.o female with RA on immunosuppressive therapy, DM on insulin therapy, and beta thalassemia who presented to the ED on 3/29 via EMS with 2 weeks of progressive myalgias and arthralgias. Per patient's sister, her symptoms started approximately 2 weeks ago with lumbar back pain. She was evaluated in the ED and told she had sciatica and treated symptomatically. The pain worsened and spread to her left wrist and right great toe, she then went to her PCP who gave her additional pain medication. Her myalgias and arthralgias continued to worsen and became more diffuse requiring her to use a wheelchair to move around. This prompted her to be evaluated by the ED and her PCP a few more times. The most recent time, she was seen in the ED on 3/28 and again treated symptomatically.   On the day of presentation and had fallen while trying to use the restroom and family was unable to help her up. EMS was called. Per EMS run sheet the patient was found to be hemodynamically stable lying on the floor in extreme pain. She denies fevers, dysuria, abdominal pain, diarrhea, new rash, recent travel prior to admission. Endorses myalgias, dark urine, arthralgias.   On admission her labs were remarkable for an elevated creatinine (0.9 -> 3.36), HAGMA, elevated alk phos, elevated bilirubin, lactic acidosis, leukocytosis (with left shift), and acute drop in her Hgb. UA illustrated hematuria, proteinuria, and bacteruria. Imaging including CXR, CT head, right foot, left wrist, and abdominal ultrasound.    PAST MEDICAL HISTORY :  She  has a past medical history of Asthma, Beta thalassemia w/chronic anemia, Diabetes mellitus type 2, insulin dependent ([place]),  Diabetic neuropathy ([place]), Endometrial polyp, Fibroadenoma of breast, GERD (gastroesophageal reflux disease), Headache, Hearing loss, right, Hepatic steatosis, Hyperlipidemia, Hypertension, Immunocompromised state due to drug therapy, Impingement syndrome of right shoulder, Nontraumatic tear of right rotator cuff, Pneumonia, Psoriasis, Rheumatoid arthritis(714.0), Sleep apnea, and Thyroid disease.  PAST SURGICAL HISTORY: She  has a past surgical history that includes Tubal ligation; Hand surgery; Tonsillectomy; left knee surgery nodule removal; Other surgical history; Dilation and curettage of uterus; Eye surgery; Cesarean section; Ankle arthroscopy (Right); Colonoscopy; and Shoulder arthroscopy with rotator cuff repair and subacromial decompression (Right, 05/20/2014).  Allergies  Allergen Reactions  . Bactrim [Sulfamethoxazole-Trimethoprim] Hives  . Cefuroxime Axetil Itching  . Cephalosporins Itching  . Iohexol Itching and Rash     Code: RASH, Desc: HAD ITCHING AND A RASH ABOUT ONE HOUR AFTER RETURNING HOME FROM THE CT, Onset Date: [id number]   . Lisinopril Cough  . Penicillins Hives  . Sulfa Antibiotics Hives   No current facility-administered medications on file prior to encounter.    Current Outpatient Medications on File Prior to Encounter  Medication Sig  . abatacept (ORENCIA) 250 MG injection Inject 1,000 mg into the vein every 28 (twenty-eight) days. Monthly Infusion  . albuterol (PROVENTIL HFA;VENTOLIN HFA) 108 (90 Base) MCG/ACT inhaler Inhale 2 puffs into the lungs every 6 (six) hours as needed for wheezing or shortness of breath.  Marland Kitchen amitriptyline (ELAVIL) 25 MG tablet Take 25 mg by mouth at bedtime.   . ARTIFICIAL TEAR OP Place 1 drop into both eyes as needed (for dry eyes).  . BYSTOLIC 10  MG tablet Take 20 mg by mouth daily.   . chlorthalidone (HYGROTON) 25 MG tablet Take 25 mg by mouth daily.  . CRESTOR 20 MG tablet Take 20 mg by mouth daily.   Marland Kitchen diltiazem (CARDIZEM CD) 240 MG  24 hr capsule Take 240 mg by mouth daily.   . fluticasone (FLONASE) 50 MCG/ACT nasal spray Place 1 spray into both nostrils daily as needed for allergies or rhinitis.  Marland Kitchen gabapentin (NEURONTIN) 300 MG capsule Take 600 mg by mouth 3 (three) times daily.   Marland Kitchen HYDROcodone-acetaminophen (NORCO/VICODIN) 5-325 MG tablet Take 1 tablet by mouth every 6 (six) hours as needed for moderate pain.  Marland Kitchen insulin aspart (NOVOLOG) 100 UNIT/ML injection Inject 50 Units into the skin 3 (three) times daily before meals. 55 base Plus sliding scale   . insulin NPH (HUMULIN N,NOVOLIN N) 100 UNIT/ML injection Inject 75 Units into the skin 3 (three) times daily with meals. 75 units with breakfast; lunch 75 units; 75 units at dinner  . irbesartan (AVAPRO) 300 MG tablet Take 300 mg by mouth daily.   . ISOtretinoin (ACCUTANE) 40 MG capsule Take 40 mg by mouth daily.  Marland Kitchen leflunomide (ARAVA) 20 MG tablet Take 20 mg by mouth daily.  . magnesium oxide (MAG-OX) 400 MG tablet Take 400 mg by mouth daily.  . meclizine (ANTIVERT) 25 MG tablet Take 25 mg by mouth 3 (three) times daily as needed for dizziness.  . metFORMIN (GLUCOPHAGE-XR) 500 MG 24 hr tablet Take 500 mg by mouth every evening.  . pantoprazole (PROTONIX) 40 MG tablet Take 40 mg by mouth daily.  Marland Kitchen spironolactone (ALDACTONE) 25 MG tablet Take 25 mg by mouth daily.  Marland Kitchen topiramate (TOPAMAX) 100 MG tablet Take 100 mg by mouth at bedtime.   . TRULICITY 8.36 OQ/9.4TM SOPN Inject 0.75 mg into the skin once a week. Patient takes on Wednesday.  . Vitamin D, Ergocalciferol, (DRISDOL) 50000 units CAPS capsule Take 50,000 Units by mouth every 7 (seven) days.  . VOLTAREN 1 % GEL Apply 1 application topically 3 (three) times daily as needed (for pain).   Marland Kitchen lidocaine (LIDODERM) 5 % Place 1 patch onto the skin daily. Remove & Discard patch within 12 hours or as directed by MD (Patient not taking: Reported on 08/08/2017)  . methocarbamol (ROBAXIN) 500 MG tablet Take 2 tablets (1,000 mg total)  by mouth 4 (four) times daily as needed (Pain). (Patient not taking: Reported on 08/25/2017)  . naproxen (NAPROSYN) 500 MG tablet Take 1 tablet (500 mg total) by mouth 2 (two) times daily. (Patient not taking: Reported on 08/30/2017)   FAMILY HISTORY:  Her indicated that her mother is alive. She indicated that her father is deceased. She indicated that the status of her sister is unknown. She indicated that the status of her brother is unknown.  SOCIAL HISTORY: She  reports that she quit smoking about 4 years ago. Her smoking use included cigarettes. She has a 1.50 pack-year smoking history. She has never used smokeless tobacco. She reports that she does not drink alcohol or use drugs.  REVIEW OF SYSTEMS:   12 point ROS performed. All negative aside from those mentioned in the HPI.  VITAL SIGNS: BP 133/74   Pulse 79   Temp (!) 97.4 F (36.3 C) (Oral)   Resp 13   Ht _0  (1.575 m)   Wt 271 lb 2.7 oz (123 kg)   LMP 12/27/2013 Comment: irreggular  SpO2 96%   BMI 49.60 kg/m   INTAKE /  OUTPUT: I/O last 3 completed shifts: In: 2548.8 [I.V.:2528.8; Other:20] Out: 190 [Urine:35; Other:155]  PHYSICAL EXAMINATION: General: Obese female resting comfortably, but arouses in pain with any type of touch  Neuro: Alert and oriented x 3, motor exam limited by the patient's pain  HEENT: Normocephalic, atraumatic, moist mucus membranes Cardiovascular: RRR, no murmurs, no rubs Lungs: Good air movement with no wheezing or crackles  Abdomen: Active bowel sounds, soft, no tenderness to palpation  Musculoskeletal: Diffuse tenderness to palpation of the left wrist and rigth great toe, 1+ pitting edema of the LE bilaterally  Skin: Warm and dry   LABS: BMET Recent Labs  Lab 09/06/17 2349 09/07/17 1649 09/08/17 0543  NA 139 136 138  K 5.0 5.2* 4.2  CL 108 106 102  CO2 15* 16* 20*  BUN 83* 84* 55*  CREATININE 5.92* 6.65* 4.28*  GLUCOSE 139* 91 75   Electrolytes Recent Labs  Lab  08/18/2017 0632  09/06/17 2349 09/07/17 1649 09/08/17 0543  CALCIUM 7.6*   < > 6.5* 6.3* 7.0*  MG 2.3  --   --   --  2.4  PHOS 5.9*  --   --  8.1* 5.6*   < > = values in this interval not displayed.   CBC Recent Labs  Lab 08/10/2017 0632 09/06/17 0310 09/06/17 2349  WBC 25.1* 24.0* 23.8*  HGB 7.6* 8.9* 8.3*  HCT 23.3* 27.5* 25.2*  PLT 423* 439* 356   Coag's Recent Labs  Lab 09/04/2017 1531  APTT 36  INR 1.45  Sepsis Markers Recent Labs  Lab 09/04/2017 1006 08/19/2017 1526 08/15/2017 1531 08/17/2017 2001 09/06/17 0310 09/06/17 2349  LATICACIDVEN 2.59* 2.1*  --  1.9  --   --   PROCALCITON  --   --  11.11  --  18.40 20.69   ABG No results for input(s): PHART, PCO2ART, PO2ART in the last 168 hours.  Liver Enzymes Recent Labs  Lab 09/03/2017 2001 09/06/17 0310 09/06/17 2349 09/07/17 1649 09/08/17 0543  AST 87* 89* 114*  --   --   ALT 34 32 34  --   --   ALKPHOS 354* 343* 382*  --   --   BILITOT 6.2* 5.9* 5.4*  --   --   ALBUMIN 2.1* 2.0* 1.7* 1.6* 1.6*   Cardiac Enzymes No results for input(s): TROPONINI, PROBNP in the last 168 hours.  Glucose Recent Labs  Lab 09/07/17 2012 09/08/17 0004 09/08/17 0427 09/08/17 0720 09/08/17 0801 09/08/17 1118  GLUCAP 80 78 71 69 102* 81   Imaging Dg Chest Port 1 View  Result Date: 09/07/2017 CLINICAL DATA:  Central line placement. EXAM: PORTABLE CHEST 1 VIEW COMPARISON:  08/20/2017 and prior radiographs FINDINGS: This is a mildly low volume film. A RIGHT IJ central venous catheter is noted with tip overlying the UPPER RIGHT atrium. There is no evidence of pneumothorax. Cardiomegaly and pulmonary vascular congestion again noted. IMPRESSION: RIGHT IJ central venous catheter with tip overlying the UPPER RIGHT atrium, in this low volume film. No pneumothorax. Cardiomegaly and pulmonary vascular congestion. Electronically Signed   By: Margarette Canada M.D.   On: 09/07/2017 14:34   STUDIES:  Transthoracic Echocardiogram  - Left ventricle:  The cavity size was normal. There was moderate   concentric hypertrophy. Systolic function was vigorous. The   estimated ejection fraction was in the range of 65% to 70%. Wall   motion was normal; there were no regional wall motion   abnormalities. - Left atrium: The atrium was mildly  dilated. - Tricuspid valve: There was moderate regurgitation. - Pulmonary arteries: Systolic pressure was moderately increased.   PA peak pressure: 51 mm Hg (S).  CULTURES: - 4/4 blood cultures on 3/29 for MSSA - Urine cultures on 3/29 growing MSSA - Blood cultures on 3/30 pending   ANTIBIOTICS: - Vancomycin 3/29 -> discontinued on 3/30  - Aztreonam 3/29 -> discontinued on 3/30  - Flagyl 3/29 -> discontinued on 3/30  - Daptomycin 3/30 -> current   SIGNIFICANT EVENTS: MAP improved over the interval.  Started on CRRT without fluid removal at this point   LINES/TUBES: Bilateral peripheral IVs  Right IJ tunneled HD catheter in place  ASSESSMENT / PLAN:  57 y.o female with RA on immunosuppressive therapy (Levunomide and Abatacept) who presented to the ED on 3/29 with sepsis and multiorgan failure secondary to MSSA Bacteremia/Endocaridits. She was subsequently started on CRRT on 3/31.   INFECTIOUS A: Sepsis secondary to MSSA Bacteremia/Endocarditis  Patient with 4/4 BC positive for MSSA on 3/29  Pathological finding consistent with endocarditis in spite on normal TTE  P:   Infectious disease onboard  Continuing daptomycin  Will need either TEE and/or MRI lumbar spine.   CARDIOVASCULAR A: Hypotension  Patient is hypertensive at baseline on bystolic, irbesartan, and spironolactone. Currently on CRRT.  P:  Continue to hold home BP medications.  Maintain MAP >65   RENAL A: AKI secondary to MSSA bacteremia, blood clots in catheter  Creatine elevated from baseline of 0.9 to 3.36  UA with hematuria, proteinuria, and bacteruria. Cultures growing Staph aureus. Also had HAGMA  Hypotensions    P:   Patient remains oliguric borderline anuric. Continue to monitor urine output and preform bladder irrigations. Nephrology onboard. Maintaining MAP on CRRT  PULMONARY A: Obstructive Sleep Apnea  On CPAP at home   P:   CPAP per RT  HEMATOLOGIC A:  Beta Thalassemia  Acute on chronic anemia secondary to sepsis Hgb dropped from baseline of ~10 to 7.6 on admission  LDH elevated but haptoglobin elevated and reticulocyte WNL therefore, unlikely to be 2/2 hemolytic anemia.   P:  Has been evaluated by hematology, no further recommendations.  Transfusion goals: Transfuse 1 units pRBCs if <7 or <8 if patient becomes hemodynamically unstable and/or has active bleeding   ENDOCRINE A: Insulin Depended Type 2 DM On Humolin 75 units TID at home   P:   CBG goal 140-180  Continue SSI-moderate and if CBGs stay above goal will need to add long acting insulin.  FAMILY  - Updates: Discussed treatment plan and coordination of care with the patient and her sisters at bedside. All voice understanding of the plan.  - Inter-disciplinary family meet or Palliative Care meeting due by: Day 7  Pulmonary and [place] Pager: [phone]  09/08/2017, 11:40 AM

## 2017-09-08 NOTE — Progress Notes (Signed)
Patient is a 57 y/o presenting with severe sepsis 2ary to bacteremia with MSSA staph with organ injury currently requiring CRRT. Discussed with nursing and critical care team. Patient will be transitioned to ICU status and critical care doctor will resume all medical management at this point.  With improvement in condition please call us back for resumption of medical care at [place], [place]

## 2017-09-08 NOTE — Progress Notes (Signed)
Dr. Wendee Beavers paged regarding patients CBG result of 69. Ordered to start D5NS@30 . RN will also follow hypoglycemia standing orders of D50 IV 58mL. Dr. Wendee Beavers asked to change patient to ICU status due to CRRT requirements and recommended a consult to CCM.

## 2017-09-08 NOTE — Consult Note (Addendum)
Reason for Consult:Joint pain Referring Physician: P Mannam  Jaeson Molstad NOTE:  Patient seen and examined and agree with below.  The most significant findings is that her right great toe has a superficial soft tissue tissue fluid collection that appears to have drained, she has good motion at the MTP joint as well as the IP joint of the great toe without significant fluctuance or drainage right now.  Apparently that fluid is already been sent off for culture and sensitivity.  Her right wrist has well-healed surgical wounds, on both the radial and ulnar side, she says she has had surgery with Dr. Burney Gauze although is not clear exactly what she had done.  The wrist has a small amount of swelling, but has reasonably good motion at least 20 degrees of dorsiflexion and 20 degrees of plantar flexion without significant pain.  She has some diffuse subcutaneous swelling.  Left wrist is more significant, and has dorsal cellulitis and soft tissue swelling around the dorsum of the hand as well as the dorsal of the radiocarpal joint as well.  The left radial aspect of the wrist has well-healed surgical wounds, no evidence for dehiscence, the ulnar side does not have a scar.  She does have a painful arc of motion at the radiocarpal joint from about 5 degrees of dorsiflexion to 15 degrees of palmar flexion.   Impression MSSA septicemia with a dorsal great toe abscess, left hand cellulitis, right mild hand cellulitis.  Plan: Continue IV antibiotics, monitor the clinical resolution of the infection in the wrists and the great toe, I do not think she needs surgical intervention at this point.  It would be nice if interventional radiology could get an aspiration of the left wrist, did determine if there is any more significant sepsis there, otherwise we can monitor clinically.  If she fails to respond we may consider CAT scan to evaluate for soft tissue abscess, but clinically I am not feeling a fluid collection that could be  drained.  Will follow.   JEFFERY NOTE:  JAELYNNE Howard is an 57 y.o. female with DM, RA, beta thalassemia. HPI: [name] was admitted over the weekend and found to have a MSSA bacteremia. She had had 2 weeks of progressive myalgias and arthralgias that began as low back pain and then progressed to her left wrist and right great toe. She continued to weaken and fell at home which prompted her return to the ED and admission. Her echo was negative for endo. She denies any prior hx/o joint pain/swelling like this. She denies hx/o gout.  Past Medical History:  Diagnosis Date  . Asthma   . Beta thalassemia w/chronic anemia    Thalassemia  . Diabetes mellitus type 2, insulin dependent (HCC)    type 2  . Diabetic neuropathy ([place])   . Endometrial polyp   . Fibroadenoma of breast    left  . GERD (gastroesophageal reflux disease)   . Headache    migraines  . Hearing loss, right   . Hepatic steatosis   . Hyperlipidemia   . Hypertension   . Immunocompromised state due to drug therapy   . Impingement syndrome of right shoulder   . Nontraumatic tear of right rotator cuff   . Pneumonia   . Psoriasis   . Rheumatoid arthritis(714.0)    rhematoid and osteoarthritis  . Sleep apnea   . Thyroid disease    had radiation    Past Surgical History:  Procedure Laterality Date  . ANKLE ARTHROSCOPY Right   .  CESAREAN SECTION    . COLONOSCOPY    . DILATION AND CURETTAGE OF UTERUS    . EYE SURGERY     surgery on retina  . HAND SURGERY     for arthritis  . left knee surgery nodule removal    . OTHER SURGICAL HISTORY     multiple nodule removal on neck, hands, left knee  . SHOULDER ARTHROSCOPY WITH ROTATOR CUFF REPAIR AND SUBACROMIAL DECOMPRESSION Right 05/20/2014   Procedure: RIGHT SHOULDER ARTHROSCOPY WITH DEBRIDEMENT/DISTAL CLAVICLE EXCISION/ROTATOR CUFF REPAIR AND SUBACROMIAL DECOMPRESSION;  Surgeon: Lorn Junes, MD;  Location: [facility];  Service: Orthopedics;  Laterality: Right;  .  TONSILLECTOMY    . TUBAL LIGATION     bilateral    Family History  Problem Relation Age of Onset  . Diabetes Mother   . Cancer Mother        breast  . Aneurysm Father        brain  . Diabetes Sister   . Cancer Brother        bone marrow cancer    Social History:  reports that she quit smoking about 4 years ago. Her smoking use included cigarettes. She has a 1.50 pack-year smoking history. She has never used smokeless tobacco. She reports that she does not drink alcohol or use drugs.  Allergies:  Allergies  Allergen Reactions  . Cefuroxime Axetil Other (See Comments)    Blistering wounds per family  . Cephalosporins Other (See Comments)    Blistering wounds as an adult per family  . Bactrim [Sulfamethoxazole-Trimethoprim] Hives  . Iohexol Itching and Rash     Code: RASH, Desc: HAD ITCHING AND A RASH ABOUT ONE HOUR AFTER RETURNING HOME FROM THE CT, Onset Date: [id number]   . Lisinopril Cough  . Penicillins Hives  . Sulfa Antibiotics Hives    Medications: I have reviewed the patient's current medications.  Results for orders placed or performed during the hospital encounter of 08/28/2017 (from the past 48 hour(s))  Glucose, capillary     Status: Abnormal   Collection Time: 09/06/17  3:36 PM  Result Value Ref Range   Glucose-Capillary 169 (H) 65 - 99 mg/dL   Comment 1 Capillary Specimen    Comment 2 Notify RN   Glucose, capillary     Status: Abnormal   Collection Time: 09/06/17  7:35 PM  Result Value Ref Range   Glucose-Capillary 129 (H) 65 - 99 mg/dL   Comment 1 Notify RN   CBC     Status: Abnormal   Collection Time: 09/06/17 11:49 PM  Result Value Ref Range   WBC 23.8 (H) 4.0 - 10.5 K/uL   RBC 3.84 (L) 3.87 - 5.11 MIL/uL   Hemoglobin 8.3 (L) 12.0 - 15.0 g/dL   HCT 25.2 (L) 36.0 - 46.0 %   MCV 65.6 (L) 78.0 - 100.0 fL   MCH 21.6 (L) 26.0 - 34.0 pg   MCHC 32.9 30.0 - 36.0 g/dL   RDW 22.1 (H) 11.5 - 15.5 %   Platelets 356 150 - 400 K/uL    Comment: Performed at  [facility], [address], [place], [place] [address]  Procalcitonin     Status: None   Collection Time: 09/06/17 11:49 PM  Result Value Ref Range   Procalcitonin 20.69 ng/mL    Comment:        Interpretation: PCT >= 10 ng/mL: Important systemic inflammatory response, almost exclusively due to severe bacterial sepsis or septic shock. (NOTE)  Sepsis PCT Algorithm           Lower Respiratory Tract                                      Infection PCT Algorithm    ----------------------------     ----------------------------         PCT < 0.25 ng/mL                PCT < 0.10 ng/mL         Strongly encourage             Strongly discourage   discontinuation of antibiotics    initiation of antibiotics    ----------------------------     -----------------------------       PCT 0.25 - 0.50 ng/mL            PCT 0.10 - 0.25 ng/mL               OR       >80% decrease in PCT            Discourage initiation of                                            antibiotics      Encourage discontinuation           of antibiotics    ----------------------------     -----------------------------         PCT >= 0.50 ng/mL              PCT 0.26 - 0.50 ng/mL                AND       <80% decrease in PCT             Encourage initiation of                                             antibiotics       Encourage continuation           of antibiotics    ----------------------------     -----------------------------        PCT >= 0.50 ng/mL                  PCT > 0.50 ng/mL               AND         increase in PCT                  Strongly encourage                                      initiation of antibiotics    Strongly encourage escalation           of antibiotics                                     -----------------------------  PCT <= 0.25 ng/mL                                                 OR                                        > 80%  decrease in PCT                                     Discontinue / Do not initiate                                             antibiotics Performed at [facility], [place] [address] [place], [place] [address]   Comprehensive metabolic panel     Status: Abnormal   Collection Time: 09/06/17 11:49 PM  Result Value Ref Range   Sodium 139 135 - 145 mmol/L   Potassium 5.0 3.5 - 5.1 mmol/L   Chloride 108 101 - 111 mmol/L   CO2 15 (L) 22 - 32 mmol/L   Glucose, Bld 139 (H) 65 - 99 mg/dL   BUN 83 (H) 6 - 20 mg/dL   Creatinine, Ser 5.92 (H) 0.44 - 1.00 mg/dL   Calcium 6.5 (L) 8.9 - 10.3 mg/dL   Total Protein 6.2 (L) 6.5 - 8.1 g/dL   Albumin 1.7 (L) 3.5 - 5.0 g/dL   AST 114 (H) 15 - 41 U/L   ALT 34 14 - 54 U/L   Alkaline Phosphatase 382 (H) 38 - 126 U/L   Total Bilirubin 5.4 (H) 0.3 - 1.2 mg/dL   GFR calc non Af Amer 7 (L) >60 mL/min   GFR calc Af Amer 8 (L) >60 mL/min    Comment: (NOTE) The eGFR has been calculated using the CKD EPI equation. This calculation has not been validated in all clinical situations. eGFR's persistently <60 mL/min signify possible Chronic Kidney Disease.    Anion gap 16 (H) 5 - 15    Comment: Performed at [facility], [place] [address], [place], [place] [address]  Glucose, capillary     Status: Abnormal   Collection Time: 09/07/17  7:27 AM  Result Value Ref Range   Glucose-Capillary 111 (H) 65 - 99 mg/dL   Comment 1 Capillary Specimen    Comment 2 Notify RN   Glucose, capillary     Status: Abnormal   Collection Time: 09/07/17 11:34 AM  Result Value Ref Range   Glucose-Capillary 105 (H) 65 - 99 mg/dL   Comment 1 Capillary Specimen    Comment 2 Notify RN   Sodium, urine, random     Status: None   Collection Time: 09/07/17 12:02 PM  Result Value Ref Range   Sodium, Ur 79 mmol/L    Comment: Performed at [facility] [address], [place], [place] [address]  Creatinine, urine, random     Status: None   Collection Time: 09/07/17 12:02 PM   Result Value Ref Range   Creatinine, Urine 131.90 mg/dL    Comment: Performed at [facility], [place] [address], [place], [place] [address]  Glucose, capillary     Status: None   Collection Time: 09/07/17  4:19 PM  Result Value Ref Range   Glucose-Capillary 91 65 - 99 mg/dL   Comment 1 Capillary Specimen    Comment 2 Notify RN   Renal function panel (daily at 1600)     Status: Abnormal   Collection Time: 09/07/17  4:49 PM  Result Value Ref Range   Sodium 136 135 - 145 mmol/L   Potassium 5.2 (H) 3.5 - 5.1 mmol/L   Chloride 106 101 - 111 mmol/L   CO2 16 (L) 22 - 32 mmol/L   Glucose, Bld 91 65 - 99 mg/dL   BUN 84 (H) 6 - 20 mg/dL   Creatinine, Ser 6.65 (H) 0.44 - 1.00 mg/dL   Calcium 6.3 (LL) 8.9 - 10.3 mg/dL    Comment: CRITICAL RESULT CALLED TO, READ BACK BY AND VERIFIED WITH: CRITE T, RN 1748 09/07/2017 THOMPSON V    Phosphorus 8.1 (H) 2.5 - 4.6 mg/dL   Albumin 1.6 (L) 3.5 - 5.0 g/dL   GFR calc non Af Amer 6 (L) >60 mL/min   GFR calc Af Amer 7 (L) >60 mL/min    Comment: (NOTE) The eGFR has been calculated using the CKD EPI equation. This calculation has not been validated in all clinical situations. eGFR's persistently <60 mL/min signify possible Chronic Kidney Disease.    Anion gap 14 5 - 15    Comment: Performed at [facility] [address], [place], [place] [address]  Glucose, capillary     Status: None   Collection Time: 09/07/17  8:12 PM  Result Value Ref Range   Glucose-Capillary 80 65 - 99 mg/dL   Comment 1 Notify RN   Glucose, capillary     Status: None   Collection Time: 09/08/17 12:04 AM  Result Value Ref Range   Glucose-Capillary 78 65 - 99 mg/dL   Comment 1 Notify RN   Glucose, capillary     Status: None   Collection Time: 09/08/17  4:27 AM  Result Value Ref Range   Glucose-Capillary 71 65 - 99 mg/dL   Comment 1 Notify RN   Renal function panel (daily at 0500)     Status: Abnormal   Collection Time: 09/08/17  5:43 AM  Result Value Ref  Range   Sodium 138 135 - 145 mmol/L   Potassium 4.2 3.5 - 5.1 mmol/L   Chloride 102 101 - 111 mmol/L   CO2 20 (L) 22 - 32 mmol/L   Glucose, Bld 75 65 - 99 mg/dL   BUN 55 (H) 6 - 20 mg/dL   Creatinine, Ser 4.28 (H) 0.44 - 1.00 mg/dL    Comment: DELTA CHECK NOTED   Calcium 7.0 (L) 8.9 - 10.3 mg/dL   Phosphorus 5.6 (H) 2.5 - 4.6 mg/dL   Albumin 1.6 (L) 3.5 - 5.0 g/dL   GFR calc non Af Amer 11 (L) >60 mL/min   GFR calc Af Amer 12 (L) >60 mL/min    Comment: (NOTE) The eGFR has been calculated using the CKD EPI equation. This calculation has not been validated in all clinical situations. eGFR's persistently <60 mL/min signify possible Chronic Kidney Disease.    Anion gap 16 (H) 5 - 15    Comment: Performed at [facility], [place] [address], [place], [place] [address]  Magnesium     Status: None   Collection Time: 09/08/17  5:43  AM  Result Value Ref Range   Magnesium 2.4 1.7 - 2.4 mg/dL    Comment: Performed at [facility], [place] [address], [place], [place] [address]  Glucose, capillary     Status: None   Collection Time: 09/08/17  7:20 AM  Result Value Ref Range   Glucose-Capillary 69 65 - 99 mg/dL   Comment 1 Capillary Specimen    Comment 2 Notify RN   Glucose, capillary     Status: Abnormal   Collection Time: 09/08/17  8:01 AM  Result Value Ref Range   Glucose-Capillary 102 (H) 65 - 99 mg/dL   Comment 1 Capillary Specimen   Glucose, capillary     Status: None   Collection Time: 09/08/17 11:18 AM  Result Value Ref Range   Glucose-Capillary 81 65 - 99 mg/dL   Comment 1 Capillary Specimen   CBC with Differential/Platelet     Status: Abnormal (Preliminary result)   Collection Time: 09/08/17  1:25 PM  Result Value Ref Range   WBC 24.1 (H) 4.0 - 10.5 K/uL   RBC 3.86 (L) 3.87 - 5.11 MIL/uL   Hemoglobin 8.1 (L) 12.0 - 15.0 g/dL   HCT 25.7 (L) 36.0 - 46.0 %   MCV 66.6 (L) 78.0 - 100.0 fL   MCH 21.0 (L) 26.0 - 34.0 pg   MCHC 31.5 30.0 - 36.0 g/dL   RDW 22.8 (H) 11.5 -  15.5 %   Platelets 344 150 - 400 K/uL    Comment: Performed at [facility], [place]. [address], [place], [place] [address]   Neutrophils Relative % PENDING %   Neutro Abs PENDING 1.7 - 7.7 K/uL   Band Neutrophils PENDING %   Lymphocytes Relative PENDING %   Lymphs Abs PENDING 0.7 - 4.0 K/uL   Monocytes Relative PENDING %   Monocytes Absolute PENDING 0.1 - 1.0 K/uL   Eosinophils Relative PENDING %   Eosinophils Absolute PENDING 0.0 - 0.7 K/uL   Basophils Relative PENDING %   Basophils Absolute PENDING 0.0 - 0.1 K/uL   WBC Morphology PENDING    RBC Morphology PENDING    Smear Review PENDING    nRBC PENDING 0 /100 WBC   Metamyelocytes Relative PENDING %   Myelocytes PENDING %   Promyelocytes Absolute PENDING %   Blasts PENDING %    Dg Chest Port 1 View  Result Date: 09/07/2017 CLINICAL DATA:  Central line placement. EXAM: PORTABLE CHEST 1 VIEW COMPARISON:  08/08/2017 and prior radiographs FINDINGS: This is a mildly low volume film. A RIGHT IJ central venous catheter is noted with tip overlying the UPPER RIGHT atrium. There is no evidence of pneumothorax. Cardiomegaly and pulmonary vascular congestion again noted. IMPRESSION: RIGHT IJ central venous catheter with tip overlying the UPPER RIGHT atrium, in this low volume film. No pneumothorax. Cardiomegaly and pulmonary vascular congestion. Electronically Signed   By: Margarette Canada M.D.   On: 09/07/2017 14:34    Review of Systems  Constitutional: Negative for weight loss.  HENT: Negative for ear discharge, ear pain, hearing loss and tinnitus.   Eyes: Negative for blurred vision, double vision, photophobia and pain.  Respiratory: Negative for cough, sputum production and shortness of breath.   Cardiovascular: Negative for chest pain.  Gastrointestinal: Negative for abdominal pain, nausea and vomiting.  Genitourinary: Negative for dysuria, flank pain, frequency and urgency.  Musculoskeletal: Positive for joint pain (Left wrist, right  great toe) and myalgias. Negative for back pain, falls and neck pain.  Neurological: Negative for dizziness, tingling, sensory change, focal weakness, loss of consciousness and headaches.  Endo/Heme/Allergies: Does not bruise/bleed easily.  Psychiatric/Behavioral: Negative for depression, memory loss and substance abuse. The patient is not nervous/anxious.    Blood pressure (!) 147/84, pulse (!) 57, temperature (!) 97.4 F (36.3 C), temperature source Oral, resp. rate (!) 23, height _0  (1.575 m), weight 123 kg (271 lb 2.7 oz), last menstrual period 12/27/2013, SpO2 100 %. Physical Exam  Constitutional: She appears well-developed and well-nourished. No distress.  HENT:  Head: Normocephalic and atraumatic.  Eyes: Conjunctivae are normal. Right eye exhibits no discharge. Left eye exhibits no discharge. No scleral icterus.  Neck: Normal range of motion.  Cardiovascular: Normal rate and regular rhythm.  Respiratory: Effort normal. No respiratory distress.  Musculoskeletal:  Right shoulder, elbow, wrist, digits- no skin wounds, nontender, no instability, no blocks to motion  Sens  Ax/R/M/U intact  Mot   Ax/ R/ PIN/ M/ AIN/ U intact  Rad 0  UEx shoulder, elbow, wrist, digits- no skin wounds, mod-severe TTP, mod pain with A/PROM, edema, mild erythema dorsally on ulnar aspect, no instability, no blocks to motion  Sens  Ax/R/M/U intact  Mot   Ax/ R/ PIN/ M/ AIN/ U intact  Rad 0  RLE No traumatic wounds, ecchymosis, or rash  Mild TTP great toe, fluctuant area dorsally prox to nail, able to express purulent fluid from prox edge, edema  No knee or ankle effusion  Knee stable to varus/ valgus and anterior/posterior stress  Sens DPN, SPN, TN intact  Motor EHL, ext, flex, evers 5/5  DP 1+, PT 1+, No significant edema  LLE No traumatic wounds, ecchymosis, or rash  Nontender  No knee or ankle effusion  Knee stable to varus/ valgus and anterior/posterior stress  Sens DPN, SPN, TN intact  Motor  EHL, ext, flex, evers 5/5  DP 1+, PT 1+, No significant edema  Neurological: She is alert.  Skin: Skin is warm and dry. She is not diaphoretic.  Psychiatric: She has a normal mood and affect. Her behavior is normal.    Assessment/Plan: Joint pain -- Differential includes gout and septic arthritis. Would like to be able to get MRI of wrist but CRRT precludes that. Will see if IR will aspirate so we can get fluid analysis. Will send culture of purulence in toe. Dr. Mardelle Matte to assess later this afternoon.    Lisette Abu, PA-C Orthopedic Surgery [phone] 09/08/2017, 2:50 PM

## 2017-09-08 NOTE — Progress Notes (Signed)
Patient converted into new onset atrial fibrillation. EKG was done to confirm showing afib with rvr. Upon reviewing patient's chart it does not appear that the patient has a history of atrial fib. She is hemodynamically stable. Her echo from 09/07/17 did not show any valvular abnormalities. The patient is already on anticoagulation. Will start IV amiodarone bolus and monitor.   Lars Mage, MD Internal Medicine PGY1 Pager:[phone] 09/08/2017, 3:42 PM

## 2017-09-08 NOTE — Progress Notes (Signed)
Subjective: Pt c/o wrist pain, toe pain, painful lesion on finger   Antibiotics:  Anti-infectives (From admission, onward)   Start     Dose/Rate Route Frequency Ordered Stop   09/08/17 1600  DAPTOmycin (CUBICIN) 1,000 mg in sodium chloride 0.9 % IVPB     1,000 mg 240 mL/hr over 30 Minutes Intravenous Every 48 hours 09/07/17 1019     09/07/17 1000  vancomycin (VANCOCIN) 1,500 mg in sodium chloride 0.9 % 500 mL IVPB  Status:  Discontinued     1,500 mg 250 mL/hr over 120 Minutes Intravenous Every 48 hours 08/23/2017 0906 09/06/17 0935   09/06/17 1600  DAPTOmycin (CUBICIN) 984 mg in sodium chloride 0.9 % IVPB  Status:  Discontinued     8 mg/kg  123 kg 239.4 mL/hr over 30 Minutes Intravenous Every 48 hours 09/06/17 1436 09/07/17 1019   09/06/17 1000  aztreonam (AZACTAM) 0.5 g in dextrose 5 % 50 mL IVPB  Status:  Discontinued     0.5 g 100 mL/hr over 30 Minutes Intravenous Every 8 hours 09/06/17 0943 09/06/17 1223   08/16/2017 2000  metroNIDAZOLE (FLAGYL) IVPB 500 mg  Status:  Discontinued     500 mg 100 mL/hr over 60 Minutes Intravenous Every 8 hours 08/12/2017 1343 09/06/17 0028   08/12/2017 0900  aztreonam (AZACTAM) 2 g in sodium chloride 0.9 % 100 mL IVPB  Status:  Discontinued     2 g 200 mL/hr over 30 Minutes Intravenous Every 8 hours 08/17/2017 0853 09/06/17 0028   08/17/2017 0900  metroNIDAZOLE (FLAGYL) IVPB 500 mg     500 mg 100 mL/hr over 60 Minutes Intravenous  Once 08/28/2017 0853 08/22/2017 1035   08/15/2017 0830  piperacillin-tazobactam (ZOSYN) IVPB 3.375 g  Status:  Discontinued     3.375 g 100 mL/hr over 30 Minutes Intravenous  Once 08/08/2017 0816 08/18/2017 0836   08/22/2017 0830  vancomycin (VANCOCIN) IVPB 1000 mg/200 mL premix  Status:  Discontinued     1,000 mg 200 mL/hr over 60 Minutes Intravenous  Once 08/26/2017 0816 08/16/2017 0822   08/15/2017 0830  vancomycin (VANCOCIN) 2,000 mg in sodium chloride 0.9 % 500 mL IVPB     2,000 mg 250 mL/hr over 120 Minutes Intravenous  Once  09/04/2017 0822 08/19/2017 1342      Medications: Scheduled Meds: . Chlorhexidine Gluconate Cloth  6 each Topical Daily  . enoxaparin (LOVENOX) injection  30 mg Subcutaneous Q24H  . insulin aspart  0-15 Units Subcutaneous TID WC  . mouth rinse  15 mL Mouth Rinse BID  . sodium chloride flush  3 mL Intravenous Q12H   Continuous Infusions: . DAPTOmycin (CUBICIN)  IV    . dextrose 5 % and 0.9% NaCl 30 mL/hr at 09/08/17 0746  . dialysate (PRISMASATE) 1,500 mL/hr at 09/08/17 0853  . dialysis replacement fluid (prismasate) 500 mL/hr at 09/08/17 0158  . dialysis replacement fluid (prismasate) 300 mL/hr at 09/08/17 0849   PRN Meds:.acetaminophen **OR** acetaminophen, heparin, morphine injection, ondansetron **OR** ondansetron (ZOFRAN) IV    Objective: Weight change: -3 lb 8.4 oz (-1.6 kg)  Intake/Output Summary (Last 24 hours) at 09/08/2017 0942 Last data filed at 09/08/2017 0900 Gross per 24 hour  Intake 627.83 ml  Output 204 ml  Net 423.83 ml   Blood pressure 131/69, pulse 79, temperature 98.2 F (36.8 C), temperature source Oral, resp. rate (!) 22, height 5\' 2"  (1.575 m), weight 271 lb 2.7 oz (123 kg), last menstrual period 12/27/2013, SpO2 99 %.  Temp:  [94.5 F (34.7 C)-98.3 F (36.8 C)] 98.2 F (36.8 C) (04/01 0725) Pulse Rate:  [68-81] 79 (04/01 0900) Resp:  [11-31] 22 (04/01 0900) BP: (74-131)/(48-80) 131/69 (04/01 0900) SpO2:  [88 %-100 %] 99 % (04/01 0900) Weight:  [271 lb 2.7 oz (123 kg)] 271 lb 2.7 oz (123 kg) (04/01 0500)  Physical Exam: General: Alert and awake,  Getting CVVHD folllowing commands recognizes family members HEENT: anicteric sclera, pupils reactive to light and accommodation, EOMI CVS regular rate, normal r,  no murmur rubs or gallops Chest: clear to auscultation bilaterally, no wheezing, rales or rhonchi Abdomen: soft nontender, nondistended, normal bowel sounds, Extremities/skin:  Skin possible Osler's node 09/08/17    Splinter hemmorhages  09/08/17      Hyperpigmented lesions: ? Janeway's chronicity uncertain     Toe swollen and tender 09/08/17:       Neuro: nonfocal  CBC:    BMET Recent Labs    09/07/17 1649 09/08/17 0543  NA 136 138  K 5.2* 4.2  CL 106 102  CO2 16* 20*  GLUCOSE 91 75  BUN 84* 55*  CREATININE 6.65* 4.28*  CALCIUM 6.3* 7.0*     Liver Panel  Recent Labs    09/01/2017 1531  09/06/17 0310 09/06/17 2349 09/07/17 1649 09/08/17 0543  PROT 6.4*   < > 6.8 6.2*  --   --   ALBUMIN 1.8*   < > 2.0* 1.7* 1.6* 1.6*  AST 81*   < > 89* 114*  --   --   ALT 32   < > 32 34  --   --   ALKPHOS 335*   < > 343* 382*  --   --   BILITOT 5.4*   < > 5.9* 5.4*  --   --   BILIDIR 3.5*  --   --   --   --   --   IBILI 1.9*  --   --   --   --   --    < > = values in this interval not displayed.       Sedimentation Rate Recent Labs    09/06/2017 1531  ESRSEDRATE 122*   C-Reactive Protein No results for input(s): CRP in the last 72 hours.  Micro Results: Recent Results (from the past 720 hour(s))  Blood Culture (routine x 2)     Status: Abnormal (Preliminary result)   Collection Time: 08/27/2017  9:50 AM  Result Value Ref Range Status   Specimen Description BLOOD RIGHT HAND  Final   Special Requests   Final    BOTTLES DRAWN AEROBIC AND ANAEROBIC Blood Culture adequate volume   Culture  Setup Time   Final    GRAM POSITIVE COCCI IN BOTH AEROBIC AND ANAEROBIC BOTTLES CRITICAL RESULT CALLED TO, READ BACK BY AND VERIFIED WITH: G ABBOTT 09/06/17 0006 JDW Performed at [facility], [address], [place], [place] [address]    Culture STAPHYLOCOCCUS AUREUS (A)  Final   Report Status PENDING  Incomplete  Blood Culture (routine x 2)     Status: Abnormal   Collection Time: 08/11/2017  1:54 PM  Result Value Ref Range Status   Specimen Description BLOOD RIGHT FOREARM  Final   Special Requests   Final    BOTTLES DRAWN AEROBIC AND ANAEROBIC Blood Culture adequate volume   Culture  Setup Time    Final    GRAM POSITIVE COCCI IN BOTH AEROBIC AND ANAEROBIC BOTTLES CRITICAL RESULT CALLED TO, READ  BACK BY AND VERIFIED WITH: Denton Brick [facility] 09/06/17 0006 JDW Performed at [facility], [place] [address] [place], [place] [address]    Culture STAPHYLOCOCCUS AUREUS (A)  Final   Report Status 09/08/2017 FINAL  Final   Organism ID, Bacteria STAPHYLOCOCCUS AUREUS  Final      Susceptibility   Staphylococcus aureus - MIC*    CIPROFLOXACIN <=0.5 SENSITIVE Sensitive     ERYTHROMYCIN <=0.25 SENSITIVE Sensitive     GENTAMICIN <=0.5 SENSITIVE Sensitive     OXACILLIN 0.5 SENSITIVE Sensitive     TETRACYCLINE <=1 SENSITIVE Sensitive     VANCOMYCIN 1 SENSITIVE Sensitive     TRIMETH/SULFA <=10 SENSITIVE Sensitive     CLINDAMYCIN <=0.25 SENSITIVE Sensitive     RIFAMPIN <=0.5 SENSITIVE Sensitive     Inducible Clindamycin NEGATIVE Sensitive     * STAPHYLOCOCCUS AUREUS  Blood Culture ID Panel (Reflexed)     Status: Abnormal   Collection Time: 09/04/2017  1:54 PM  Result Value Ref Range Status   Enterococcus species NOT DETECTED NOT DETECTED Final   Listeria monocytogenes NOT DETECTED NOT DETECTED Final   Staphylococcus species DETECTED (A) NOT DETECTED Final    Comment: CRITICAL RESULT CALLED TO, READ BACK BY AND VERIFIED WITH: G ABBOTT PHARMD 09/06/17 0006 JDW    Staphylococcus aureus DETECTED (A) NOT DETECTED Final    Comment: Methicillin (oxacillin) susceptible Staphylococcus aureus (MSSA). Preferred therapy is anti staphylococcal beta lactam antibiotic (Cefazolin or Nafcillin), unless clinically contraindicated. CRITICAL RESULT CALLED TO, READ BACK BY AND VERIFIED WITH: G ABBOTT PHARMD 09/06/17 0006 JDW    Methicillin resistance NOT DETECTED NOT DETECTED Final   Streptococcus species NOT DETECTED NOT DETECTED Final   Streptococcus agalactiae NOT DETECTED NOT DETECTED Final   Streptococcus pneumoniae NOT DETECTED NOT DETECTED Final   Streptococcus pyogenes NOT DETECTED NOT DETECTED Final    Acinetobacter baumannii NOT DETECTED NOT DETECTED Final   Enterobacteriaceae species NOT DETECTED NOT DETECTED Final   Enterobacter cloacae complex NOT DETECTED NOT DETECTED Final   Escherichia coli NOT DETECTED NOT DETECTED Final   Klebsiella oxytoca NOT DETECTED NOT DETECTED Final   Klebsiella pneumoniae NOT DETECTED NOT DETECTED Final   Proteus species NOT DETECTED NOT DETECTED Final   Serratia marcescens NOT DETECTED NOT DETECTED Final   Haemophilus influenzae NOT DETECTED NOT DETECTED Final   Neisseria meningitidis NOT DETECTED NOT DETECTED Final   Pseudomonas aeruginosa NOT DETECTED NOT DETECTED Final   Candida albicans NOT DETECTED NOT DETECTED Final   Candida glabrata NOT DETECTED NOT DETECTED Final   Candida krusei NOT DETECTED NOT DETECTED Final   Candida parapsilosis NOT DETECTED NOT DETECTED Final   Candida tropicalis NOT DETECTED NOT DETECTED Final    Comment: Performed at [facility], [place] [address] [place], [place] [address]  Urine culture     Status: Abnormal   Collection Time: 09/07/2017  3:56 PM  Result Value Ref Range Status   Specimen Description URINE, RANDOM  Final   Special Requests   Final    NONE Performed at [facility], [place] [address] [place], [place] [address]    Culture 50,000 COLONIES/mL STAPHYLOCOCCUS AUREUS (A)  Final   Report Status 09/07/2017 FINAL  Final   Organism ID, Bacteria STAPHYLOCOCCUS AUREUS (A)  Final      Susceptibility   Staphylococcus aureus - MIC*    CIPROFLOXACIN <=0.5 SENSITIVE Sensitive     GENTAMICIN <=0.5 SENSITIVE Sensitive     NITROFURANTOIN 32 SENSITIVE Sensitive  OXACILLIN 0.5 SENSITIVE Sensitive     TETRACYCLINE <=1 SENSITIVE Sensitive     VANCOMYCIN 1 SENSITIVE Sensitive     TRIMETH/SULFA <=10 SENSITIVE Sensitive     CLINDAMYCIN <=0.25 SENSITIVE Sensitive     RIFAMPIN <=0.5 SENSITIVE Sensitive     Inducible Clindamycin NEGATIVE Sensitive     * 50,000 COLONIES/mL STAPHYLOCOCCUS AUREUS  MRSA PCR  Screening     Status: None   Collection Time: 09/06/17  5:20 AM  Result Value Ref Range Status   MRSA by PCR NEGATIVE NEGATIVE Final    Comment:        The GeneXpert MRSA Assay (FDA approved for NASAL specimens only), is one component of a comprehensive MRSA colonization surveillance program. It is not intended to diagnose MRSA infection nor to guide or monitor treatment for MRSA infections. Performed at [facility], [place] [address], [place], [place] [address]     Studies/Results: Dg Chest Port 1 View  Result Date: 09/07/2017 CLINICAL DATA:  Central line placement. EXAM: PORTABLE CHEST 1 VIEW COMPARISON:  08/19/2017 and prior radiographs FINDINGS: This is a mildly low volume film. A RIGHT IJ central venous catheter is noted with tip overlying the UPPER RIGHT atrium. There is no evidence of pneumothorax. Cardiomegaly and pulmonary vascular congestion again noted. IMPRESSION: RIGHT IJ central venous catheter with tip overlying the UPPER RIGHT atrium, in this low volume film. No pneumothorax. Cardiomegaly and pulmonary vascular congestion. Electronically Signed   By: Margarette Canada M.D.   On: 09/07/2017 14:34      Assessment/Plan:  INTERVAL HISTORY: HD line placed and on CVVHD   Principal Problem:   Bacteremia due to methicillin susceptible Staphylococcus aureus (MSSA) Active Problems:   Thalassemia   Rheumatoid arthritis ([place])   Morbid obesity ([place])   Acute back pain   Essential hypertension   Diabetes ([place])   Dyslipidemia   Sepsis ([place])   Acute kidney injury ([place])   Immunosuppression due to drug therapy   Rheumatoid arthritis(714.0)   Acute pain of left wrist   Great toe pain, right   Hypertension   Hyperlipidemia   Acute hearing loss of left ear   Diabetes mellitus, insulin dependent (IDDM), controlled (HCC)   Beta thalassemia (HCC)   Symptomatic anemia   Elevated liver enzymes   Hyperbilirubinemia    ELLIANA BAL is a 57 y.o. female with  RA   Admitted with MSSA B with septic shock, ARF, shock liver with evidence of endocarditis and "metastatic infection: with likely left septic wrist, infected right toe, and possible Lumbar spine infection  #1 MSSAB with shock metastatic disease:       [name] [facility]icrobial Management Team Staphylococcus aureus bacteremia   Staphylococcus aureus bacteremia (SAB) is associated with a high rate of complications and mortality.  Specific aspects of clinical management are critical to optimizing the outcome of patients with SAB.  Therefore, the [facility] Antimicrobial Management Team [facility]) has initiated an intervention aimed at improving the management of SAB at [facility].  To do so, Infectious Diseases physicians are providing an evidence-based consult for the management of all patients with SAB.     Yes No Comments  Perform follow-up blood cultures (even if the patient is afebrile) to ensure clearance of bacteremia [x]  []  Hopefully 09/07/17 cultures will clear otherwise she will later on need an HD line "holiday"  Remove vascular catheter and obtain follow-up blood cultures after the removal of the catheter []  []  See above  Perform echocardiography to  evaluate for endocarditis (transthoracic ECHO is 40-50% sensitive, TEE is > 90% sensitive) []  []  Please keep in mind, that neither test can definitively EXCLUDE endocarditis, and that should clinical suspicion remain high for endocarditis the patient should then still be treated with an "endocarditis" duration of therapy = 6 weeks  She is going to get treated for 6-8 weeks but we will want a TEE along the way as well if possible  Consult electrophysiologist to evaluate implanted cardiac device (pacemaker, ICD) []  []    Ensure source control []  []  Have all abscesses been drained effectively? Have deep seeded infections (septic joints or osteomyelitis) had appropriate surgical debridement?  NO  Investigate for "metastatic" sites of infection []  []   Does the patient have ANY symptom or physical exam finding that would suggest a deeper infection (back or neck pain that may be suggestive of vertebral osteomyelitis or epidural abscess, muscle pain that could be a symptom of pyomyositis)?  Keep in mind that for deep seeded infections MRI imaging with contrast is preferred rather than other often insensitive tests such as plain x-rays, especially early in a patient's presentation.  She needs orthopedic consultation and MRI of left wrist WO gad, right toe and foot WO gad and L spine   Change antibiotic therapy to daptomycin []  []  Beta-lactam antibiotics are preferred for MSSA due to higher cure rates.   If on Vancomycin, goal trough should be 15 - 20 mcg/mL  Estimated duration of IV antibiotic therapy:  8 weeks []  []  Consult case management for probably prolonged outpatient IV antibiotic therapy   I spent greater than 35  minutes with the patient including greater than 50% of time in face to face counsel of the patient and daughter re workup for her metastatic MSSAB and in coordination of her care with primary team.    LOS: 3 days   Alcide Evener 09/08/2017, 9:42 AM

## 2017-09-08 DEATH — deceased

## 2017-09-09 ENCOUNTER — Inpatient Hospital Stay (HOSPITAL_COMMUNITY): Payer: Medicare Other

## 2017-09-09 DIAGNOSIS — M25532 Pain in left wrist: Secondary | ICD-10-CM

## 2017-09-09 DIAGNOSIS — L0889 Other specified local infections of the skin and subcutaneous tissue: Secondary | ICD-10-CM

## 2017-09-09 DIAGNOSIS — I38 Endocarditis, valve unspecified: Secondary | ICD-10-CM

## 2017-09-09 LAB — CBC
HEMATOCRIT: 27.2 % — AB (ref 36.0–46.0)
Hemoglobin: 8.6 g/dL — ABNORMAL LOW (ref 12.0–15.0)
MCH: 20.8 pg — ABNORMAL LOW (ref 26.0–34.0)
MCHC: 31.6 g/dL (ref 30.0–36.0)
MCV: 65.9 fL — ABNORMAL LOW (ref 78.0–100.0)
PLATELETS: 347 10*3/uL (ref 150–400)
RBC: 4.13 MIL/uL (ref 3.87–5.11)
RDW: 23 % — ABNORMAL HIGH (ref 11.5–15.5)
WBC: 23.6 10*3/uL — AB (ref 4.0–10.5)

## 2017-09-09 LAB — HEPATIC FUNCTION PANEL
ALBUMIN: 1.5 g/dL — AB (ref 3.5–5.0)
ALT: 30 U/L (ref 14–54)
ALT: 30 U/L (ref 14–54)
AST: 102 U/L — ABNORMAL HIGH (ref 15–41)
AST: 98 U/L — ABNORMAL HIGH (ref 15–41)
Albumin: 1.5 g/dL — ABNORMAL LOW (ref 3.5–5.0)
Alkaline Phosphatase: 526 U/L — ABNORMAL HIGH (ref 38–126)
Alkaline Phosphatase: 541 U/L — ABNORMAL HIGH (ref 38–126)
BILIRUBIN DIRECT: 3.7 mg/dL — AB (ref 0.1–0.5)
BILIRUBIN INDIRECT: 1.7 mg/dL — AB (ref 0.3–0.9)
BILIRUBIN INDIRECT: 1.7 mg/dL — AB (ref 0.3–0.9)
BILIRUBIN TOTAL: 5.4 mg/dL — AB (ref 0.3–1.2)
Bilirubin, Direct: 3.7 mg/dL — ABNORMAL HIGH (ref 0.1–0.5)
TOTAL PROTEIN: 6.9 g/dL (ref 6.5–8.1)
Total Bilirubin: 5.4 mg/dL — ABNORMAL HIGH (ref 0.3–1.2)
Total Protein: 6.4 g/dL — ABNORMAL LOW (ref 6.5–8.1)

## 2017-09-09 LAB — GLUCOSE, CAPILLARY
GLUCOSE-CAPILLARY: 135 mg/dL — AB (ref 65–99)
GLUCOSE-CAPILLARY: 145 mg/dL — AB (ref 65–99)
Glucose-Capillary: 127 mg/dL — ABNORMAL HIGH (ref 65–99)
Glucose-Capillary: 149 mg/dL — ABNORMAL HIGH (ref 65–99)
Glucose-Capillary: 125 mg/dL — ABNORMAL HIGH (ref 65–99)
Glucose-Capillary: 136 mg/dL — ABNORMAL HIGH (ref 65–99)

## 2017-09-09 LAB — POCT I-STAT 3, VENOUS BLOOD GAS (G3P V)
ACID-BASE DEFICIT: 1 mmol/L (ref 0.0–2.0)
BICARBONATE: 22.6 mmol/L (ref 20.0–28.0)
O2 SAT: 80 %
TCO2: 24 mmol/L (ref 22–32)
pCO2, Ven: 33.8 mmHg — ABNORMAL LOW (ref 44.0–60.0)
pH, Ven: 7.432 — ABNORMAL HIGH (ref 7.250–7.430)
pO2, Ven: 42 mmHg (ref 32.0–45.0)

## 2017-09-09 LAB — RENAL FUNCTION PANEL
ALBUMIN: 1.5 g/dL — AB (ref 3.5–5.0)
ANION GAP: 15 (ref 5–15)
ANION GAP: 14 (ref 5–15)
Albumin: 1.4 g/dL — ABNORMAL LOW (ref 3.5–5.0)
BUN: 32 mg/dL — ABNORMAL HIGH (ref 6–20)
BUN: 31 mg/dL — ABNORMAL HIGH (ref 6–20)
CALCIUM: 8.1 mg/dL — AB (ref 8.9–10.3)
CO2: 22 mmol/L (ref 22–32)
CO2: 22 mmol/L (ref 22–32)
Calcium: 7.9 mg/dL — ABNORMAL LOW (ref 8.9–10.3)
Chloride: 99 mmol/L — ABNORMAL LOW (ref 101–111)
Chloride: 100 mmol/L — ABNORMAL LOW (ref 101–111)
Creatinine, Ser: 2.55 mg/dL — ABNORMAL HIGH (ref 0.44–1.00)
Creatinine, Ser: 2.47 mg/dL — ABNORMAL HIGH (ref 0.44–1.00)
GFR calc non Af Amer: 20 mL/min — ABNORMAL LOW (ref >60)
GFR calc non Af Amer: 21 mL/min — ABNORMAL LOW (ref >60)
GFR, EST AFRICAN AMERICAN: 23 mL/min — AB (ref >60)
GFR, EST AFRICAN AMERICAN: 24 mL/min — AB (ref >60)
Glucose, Bld: 150 mg/dL — ABNORMAL HIGH (ref 65–99)
Glucose, Bld: 144 mg/dL — ABNORMAL HIGH (ref 65–99)
PHOSPHORUS: 4.7 mg/dL — AB (ref 2.5–4.6)
Phosphorus: 4.3 mg/dL (ref 2.5–4.6)
Potassium: 4 mmol/L (ref 3.5–5.1)
Potassium: 3.7 mmol/L (ref 3.5–5.1)
SODIUM: 136 mmol/L (ref 135–145)
Sodium: 136 mmol/L (ref 135–145)

## 2017-09-09 LAB — AMMONIA: AMMONIA: 44 umol/L — AB (ref 9–35)

## 2017-09-09 LAB — CULTURE, BLOOD (ROUTINE X 2): SPECIAL REQUESTS: ADEQUATE

## 2017-09-09 LAB — LACTIC ACID, PLASMA
LACTIC ACID, VENOUS: 1.5 mmol/L (ref 0.5–1.9)
Lactic Acid, Venous: 1.2 mmol/L (ref 0.5–1.9)

## 2017-09-09 LAB — MAGNESIUM: Magnesium: 2.5 mg/dL — ABNORMAL HIGH (ref 1.7–2.4)

## 2017-09-09 LAB — PROTIME-INR
INR: 1.69
PROTHROMBIN TIME: 19.8 s — AB (ref 11.4–15.2)

## 2017-09-09 MED ORDER — FENTANYL CITRATE (PF) 100 MCG/2ML IJ SOLN
12.5000 ug | INTRAMUSCULAR | Status: DC | PRN
  Administered 2017-09-09 – 2017-09-11 (×4): 12.5 ug via INTRAVENOUS
  Filled 2017-09-09 (×4): qty 2

## 2017-09-09 MED ORDER — IOPAMIDOL (ISOVUE-M 200) INJECTION 41%
1.0000 mL | Freq: Once | INTRAMUSCULAR | Status: AC
  Administered 2017-09-09: 1 mL via INTRA_ARTICULAR

## 2017-09-09 MED ORDER — MIDAZOLAM HCL 2 MG/2ML IJ SOLN
2.0000 mg | Freq: Once | INTRAMUSCULAR | Status: AC
  Administered 2017-09-09: 2 mg via INTRAVENOUS

## 2017-09-09 MED ORDER — METOPROLOL TARTRATE 5 MG/5ML IV SOLN
5.0000 mg | Freq: Four times a day (QID) | INTRAVENOUS | Status: DC
  Administered 2017-09-09 – 2017-09-10 (×4): 5 mg via INTRAVENOUS
  Filled 2017-09-09 (×5): qty 5

## 2017-09-09 MED ORDER — MIDAZOLAM HCL 2 MG/2ML IJ SOLN
INTRAMUSCULAR | Status: AC
  Filled 2017-09-09: qty 2

## 2017-09-09 MED ORDER — WHITE PETROLATUM EX OINT
TOPICAL_OINTMENT | CUTANEOUS | Status: AC
  Administered 2017-09-09: 0.2
  Filled 2017-09-09: qty 28.35

## 2017-09-09 MED ORDER — FENTANYL CITRATE (PF) 100 MCG/2ML IJ SOLN
INTRAMUSCULAR | Status: AC
  Filled 2017-09-09: qty 2

## 2017-09-09 MED ORDER — LIDOCAINE HCL (PF) 1 % IJ SOLN
5.0000 mL | Freq: Once | INTRAMUSCULAR | Status: AC
  Administered 2017-09-09: 2 mL via INTRADERMAL

## 2017-09-09 NOTE — Progress Notes (Signed)
SLP Cancellation Note  Patient Details Name: LEKIA NIER MRN: [id number] DOB: 03-24-1961   Cancelled treatment:       Reason Eval/Treat Not Completed: Medical issues which prohibited therapy - change of mentation today per RN. Not very alert, not verbalizing. RN reports she has been holding POs this morning. Will defer SLP f/u for now - would continue to hold POs until more alert given increased risk for aspiration.   Germain Osgood 09/09/2017, 9:35 AM  Germain Osgood, M.A. CCC-SLP [phone]

## 2017-09-09 NOTE — Progress Notes (Signed)
PULMONARY  / CRITICAL CARE MEDICINE  Name: Jessica Howard MRN: [id number] DOB: 03-23-1961    LOS: 65  REFERRING MD :  Velvet Bathe MD  CHIEF COMPLAINT:  Back Pain   BRIEF PATIENT DESCRIPTION: Patient is a 57 y.o female with RA on immunosuppressive therapy (Levunomide and Abatacept) and DM who presented to the ED with 2 weeks of progressive myalgias and arthralgias. Initial work-up was significant for AKI, HAGMA, elevated liver enzymes, lactic acidosis, leukocytosis, and acute on chronic anemia. Blood cultures subsequently grew MSSA (4/4). Initial TTE was negative for features of endocarditis; however, patient has pathologic PE findings including splinter hemorrhages and Olser nodes. Her renal function worsened over the course of her hospitalization and she is requiring CRRT. The patient also converted to atrial fibrillation and was placed on amiodarone. Pursuing MRI of the lumbar spine and TEE once the patient is stable and off CRRT.   LINES / TUBES: Bilateral peripheral IVs  Right IJ tunneled HD catheter  Urinary catheter   CULTURES: - 4/4 blood cultures on 3/29 for MSSA - Urine cultures on 3/29 growing MSSA - Blood cultures on 3/30 with no growth at 24 hours - Right toe superficial wound culture with gram positive cocci   ANTIBIOTICS: - Vancomycin 3/29 -> discontinued on 3/30  - Aztreonam 3/29 -> discontinued on 3/30  - Flagyl 3/29 -> discontinued on 3/30  - Daptomycin 3/30 -> current   INTERVAL HISTORY:  Patient feeling okay this AM. She is very tired and a little nauseous. Does not have an appetite this AM. Was cold overnight so she was placed on a bear hugger. Nursing reports changes in the patient's mental status. Initially able to converse now seems more lethargic and slow to answer questions.   Continuing on CRRT with removal of approximately 50cc per hour. Nephrology following.   Orthopedic surgery evaluated the patient and have discussed the case with IR. They are going  to attempt to aspirate the left wrist.   A-fib with continued tachycardia on Amiodarone 30 mg /hr.  VITAL SIGNS: Temp:  [96.1 F (35.6 C)-97.9 F (36.6 C)] 97.9 F (36.6 C) (04/02 0730) Pulse Rate:  [57-139] 126 (04/02 0700) Resp:  [7-26] 7 (04/02 0700) BP: (90-152)/(49-120) 117/63 (04/02 0700) SpO2:  [84 %-100 %] 99 % (04/02 0700) Weight:  [277 lb 12.5 oz (126 kg)] 277 lb 12.5 oz (126 kg) (04/02 0449)  INTAKE / OUTPUT: Intake/Output      04/01 0701 - 04/02 0700 04/02 0701 - 04/03 0700   I.V. (mL/kg) 1314.3 (10.4)    Other 30    IV Piggyback 120    Total Intake(mL/kg) 1464.3 (11.6)    Urine (mL/kg/hr) 60 (0)    Other 1475    Total Output 1535    Net -70.7          PHYSICAL EXAMINATION:  General: Obese female resting comfortably Neuro: Arousable but slow to respond to questions. HEENT: Normocephalic, atraumatic, moist mucus membranes Cardiovascular: RRR, no murmurs, no rubs Lungs: Good air movement with no wheezing or crackles  Abdomen: Active bowel sounds, soft, no tenderness to palpation  Musculoskeletal: Diffuse tenderness to palpation of the left wrist and rigth great toe, no LE edema  Skin: Warm and dry   LABS: Cbc Recent Labs  Lab 09/06/17 2349 09/08/17 1325 09/09/17 0432  WBC 23.8* 24.1* 23.6*  HGB 8.3* 8.1* 8.6*  HCT 25.2* 25.7* 27.2*  PLT 356 344 347   Chemistry Recent Labs  Lab 08/31/2017 782 211 8110  09/07/17 1649 09/08/17 0543 09/09/17 0432  NA 132*   < > 136 138 136  K 5.1   < > 5.2* 4.2 4.0  CL 97*   < > 106 102 99*  CO2 18*   < > 16* 20* 22  BUN 69*   < > 84* 55* 32*  CREATININE 3.36*   < > 6.65* 4.28* 2.55*  CALCIUM 7.6*   < > 6.3* 7.0* 8.1*  MG 2.3  --   --  2.4 2.5*  PHOS 5.9*  --  8.1* 5.6* 4.7*  GLUCOSE 337*   < > 91 75 150*   < > = values in this interval not displayed.   Liver fxn Recent Labs  Lab 09/06/17 0310 09/06/17 2349 09/07/17 1649 09/08/17 0543 09/09/17 0432  AST 89* 114*  --   --  102*  ALT 32 34  --   --  30  ALKPHOS  343* 382*  --   --  526*  BILITOT 5.9* 5.4*  --   --  5.4*  PROT 6.8 6.2*  --   --  6.9  ALBUMIN 2.0* 1.7* 1.6* 1.6* 1.5*  1.5*   coags Recent Labs  Lab 08/22/2017 1531  APTT 36  INR 1.45   Sepsis markers Recent Labs  Lab 08/13/2017 1006 09/06/2017 1526 08/14/2017 1531 08/23/2017 2001 09/06/17 0310 09/06/17 2349  LATICACIDVEN 2.59* 2.1*  --  1.9  --   --   PROCALCITON  --   --  11.11  --  18.40 20.69   Cardiac markers Recent Labs  Lab 08/12/2017 0632 09/06/17 1030  CKTOTAL 55 233   BNP No results for input(s): PROBNP in the last 168 hours.   ABG Recent Labs  Lab 08/26/2017 0805  HCO3 20.3  TCO2 21*   CBG trend Recent Labs  Lab 09/08/17 1518 09/08/17 1928 09/08/17 2323 09/09/17 0256 09/09/17 0727  GLUCAP 83 123* 121* 127* 149*   ECG: Tachycardia with irregularly irregular rhythm. Normal axis. Narrow QRS. No discernable P waves noted. Consistent with atrial fibrillation.   DIAGNOSES: Principal Problem:   Bacteremia due to methicillin susceptible Staphylococcus aureus (MSSA) Active Problems:   Thalassemia   Rheumatoid arthritis ([place])   Morbid obesity ([place])   Acute back pain   Essential hypertension   Diabetes ([place])   Dyslipidemia   Sepsis ([place])   Acute kidney injury ([place])   Immunosuppression due to drug therapy   Rheumatoid arthritis(714.0)   Acute pain of left wrist   Great toe pain, right   Hypertension   Hyperlipidemia   Acute hearing loss of left ear   Diabetes mellitus, insulin dependent (IDDM), controlled (HCC)   Beta thalassemia (HCC)   Symptomatic anemia   Elevated liver enzymes   Hyperbilirubinemia   ASSESSMENT / PLAN: 57 y.o female with RA on immunosuppressive therapy (Levunomide and Abatacept) who presented to the ED on 3/29 with sepsis and multiorgan failure secondary to MSSA Bacteremia/Endocaridits. She was subsequently started on CRRT on 3/31.   A: Sepsis secondary to MSSA Bacteremia/Endocarditis  Patient with 4/4 BC positive for MSSA  on 3/29 but repeat cultures on 3/30 with NGTD Pathological finding consistent with endocarditis in spite on normal TTE. [place] right great toe abscess, left hand cellulitis, and right hand cellulitis  P:   Infectious disease onboard  Continuing daptomycin, start date 3/30 if cultures remain negative Will need either TEE and/or MRI lumbar spine.  Orthopedic surgery has evaluated the patient and are working with IR to aspirate  the left wrist.   CARDIOVASCULAR A:  Hypotension Patient is hypertensive at baseline on bystolic, irbesartan, and spironolactone. Currently on CRRT.  Atrial Fibrillation 2/2 sepsis  TTE without atrial enlargement or dilation  Started on amiodarone due to hypotension   P:  Currently still in atrial fibrillation, will increase from 30 mg/hr to 70m/hr.  Continue to hold home BP medications.  Maintain MAP >65   RENAL A: AKI secondary to MSSA bacteremia, blood clots in catheter  Creatine elevated from baseline of 0.9 to 3.36 on admission  UA with hematuria, proteinuria, and bacteruria. Cultures growing Staph aureus.  P:   Patient remains oliguric borderline anuric. Continue to monitor urine output and preform bladder irrigations. Nephrology onboard. Maintaining MAP on CRRT  PULMONARY A: Obstructive Sleep Apnea  On CPAP at home   P:   CPAP per RT  GASTROINTESTINAL A: Elevated LFTs ? Shock Liver  Presented with elevated LFTs, Alk phos, and T. Bili  Abdominal ultrasound unremarkable for acute etiology  Repeat LFTs remain elevated   P:   Maintain MAP >65  HEMATOLOGIC A:  Beta Thalassemia  Acute on chronic anemia secondary to sepsis Hgb dropped from baseline of ~10 to 7.6 on admission  LDH elevated but haptoglobin elevated and reticulocyte WNL therefore, unlikely to be 2/2 hemolytic anemia.   P:  Has been evaluated by hematology, no further recommendations.  Transfusion goals: Transfuse 1 units pRBCs if <7 or <8 if patient becomes  hemodynamically unstable and/or has active bleeding   ENDOCRINE A: Insulin Dependent Type 2 DM On Humolin 75 units TID at home   P:   CBG goal 140-180  Continue SSI-moderate   NEUROLOGICAL A: Altered Mental Status  Multiple contributing factors including A-fib, sepsis, opiates  P:  Will continue treatment for MSSA bacteremia as outlined above  Increase amiodarone to 45 mg/hr  Given the patient renal impairment will switch from morphine for pain control to fentanyl.   Pulmonary and CSombrilloPager: [phone] 09/09/2017, 7:53 AM

## 2017-09-09 NOTE — Progress Notes (Addendum)
S:Pt has become more confused overnight.  Also has A fib with RVR and now on amio drip. O:BP 118/79   Pulse (!) 128   Temp 97.9 F (36.6 C) (Oral)   Resp (!) 26   Ht 5\' 2"  (1.575 m)   Wt 126 kg (277 lb 12.5 oz)   LMP 12/27/2013 Comment: irreggular  SpO2 91%   BMI 50.81 kg/m   Intake/Output Summary (Last 24 hours) at 09/09/2017 1056 Last data filed at 09/09/2017 1000 Gross per 24 hour  Intake 1512.42 ml  Output 1636 ml  Net -123.58 ml   Intake/Output: I/O last 3 completed shifts: In: 1607.3 [I.V.:1437.3; Other:50; IV Piggyback:120] Out: 5809 [Urine:85; Other:1600]  Intake/Output this shift:  Total I/O In: 170.1 [I.V.:170.1] Out: 154 [Urine:20; Other:134] Weight change: 3 kg (6 lb 9.8 oz) Gen: toxic appearing CVS: tachy Resp: occ rhonchi Abd: +BS, soft Ext: 1+ edema  Recent Labs  Lab 09/01/2017 0632 08/23/2017 1531 08/27/2017 2001 09/06/17 0310 09/06/17 2349 09/07/17 1649 09/08/17 0543 09/09/17 0432  NA 132* 136 135 137 139 136 138 136  K 5.1 5.6* 6.0* 5.6* 5.0 5.2* 4.2 4.0  CL 97* 103 104 105 108 106 102 99*  CO2 18* 18* 15* 16* 15* 16* 20* 22  GLUCOSE 337* 298* 275* 190* 139* 91 75 150*  BUN 69* 69* 69* 74* 83* 84* 55* 32*  CREATININE 3.36* 3.75* 3.91* 4.34* 5.92* 6.65* 4.28* 2.55*  ALBUMIN 2.0* 1.8* 2.1* 2.0* 1.7* 1.6* 1.6* 1.5*  1.5*  CALCIUM 7.6* 7.0* 7.0* 7.1* 6.5* 6.3* 7.0* 8.1*  PHOS 5.9*  --   --   --   --  8.1* 5.6* 4.7*  AST 53* 81* 87* 89* 114*  --   --  102*  ALT 24 32 34 32 34  --   --  30   Liver Function Tests: Recent Labs  Lab 09/06/17 0310 09/06/17 2349 09/07/17 1649 09/08/17 0543 09/09/17 0432  AST 89* 114*  --   --  102*  ALT 32 34  --   --  30  ALKPHOS 343* 382*  --   --  526*  BILITOT 5.9* 5.4*  --   --  5.4*  PROT 6.8 6.2*  --   --  6.9  ALBUMIN 2.0* 1.7* 1.6* 1.6* 1.5*  1.5*   Recent Labs  Lab 09/04/2017 0815  LIPASE 28   Recent Labs  Lab 09/04/2017 0956 09/06/17 0310  AMMONIA 38* 101*   CBC: Recent Labs  Lab  08/19/2017 0632 09/06/17 0310 09/06/17 2349 09/08/17 1325 09/09/17 0432  WBC 25.1* 24.0* 23.8* 24.1* 23.6*  NEUTROABS 22.1*  --   --  21.7*  --   HGB 7.6* 8.9* 8.3* 8.1* 8.6*  HCT 23.3* 27.5* 25.2* 25.7* 27.2*  MCV 61.0* 65.0* 65.6* 66.6* 65.9*  PLT 423* 439* 356 344 347   Cardiac Enzymes: Recent Labs  Lab 08/10/2017 0632 09/06/17 1030  CKTOTAL 55 233   CBG: Recent Labs  Lab 09/08/17 1518 09/08/17 1928 09/08/17 2323 09/09/17 0256 09/09/17 0727  GLUCAP 83 123* 121* 127* 149*    Iron Studies: No results for input(s): IRON, TIBC, TRANSFERRIN, FERRITIN in the last 72 hours. Studies/Results: Dg Chest Port 1 View  Result Date: 09/07/2017 CLINICAL DATA:  Central line placement. EXAM: PORTABLE CHEST 1 VIEW COMPARISON:  08/23/2017 and prior radiographs FINDINGS: This is a mildly low volume film. A RIGHT IJ central venous catheter is noted with tip overlying the UPPER RIGHT atrium. There is no evidence of  pneumothorax. Cardiomegaly and pulmonary vascular congestion again noted. IMPRESSION: RIGHT IJ central venous catheter with tip overlying the UPPER RIGHT atrium, in this low volume film. No pneumothorax. Cardiomegaly and pulmonary vascular congestion. Electronically Signed   By: Margarette Canada M.D.   On: 09/07/2017 14:34   . Chlorhexidine Gluconate Cloth  6 each Topical Daily  . enoxaparin (LOVENOX) injection  30 mg Subcutaneous Q24H  . insulin aspart  0-15 Units Subcutaneous TID WC  . mouth rinse  15 mL Mouth Rinse BID  . metoprolol tartrate  5 mg Intravenous Q6H  . sodium chloride flush  3 mL Intravenous Q12H    BMET    Component Value Date/Time   NA 136 09/09/2017 0432   NA 142 03/06/2017 0929   K 4.0 09/09/2017 0432   K 4.1 03/06/2017 0929   CL 99 (L) 09/09/2017 0432   CL 101 02/13/2012 0850   CO2 22 09/09/2017 0432   CO2 23 03/06/2017 0929   GLUCOSE 150 (H) 09/09/2017 0432   GLUCOSE 129 03/06/2017 0929   GLUCOSE 529 (H) 02/13/2012 0850   BUN 32 (H) 09/09/2017 0432    BUN 17.1 03/06/2017 0929   CREATININE 2.55 (H) 09/09/2017 0432   CREATININE 0.8 03/06/2017 0929   CALCIUM 8.1 (L) 09/09/2017 0432   CALCIUM 9.6 03/06/2017 0929   GFRNONAA 20 (L) 09/09/2017 0432   GFRAA 23 (L) 09/09/2017 0432   CBC    Component Value Date/Time   WBC 23.6 (H) 09/09/2017 0432   RBC 4.13 09/09/2017 0432   HGB 8.6 (L) 09/09/2017 0432   HGB 9.4 (L) 05/16/2017 0937   HCT 27.2 (L) 09/09/2017 0432   HCT 30.4 (L) 05/16/2017 0937   PLT 347 09/09/2017 0432   PLT 266 05/16/2017 0937   MCV 65.9 (L) 09/09/2017 0432   MCV 64.4 (L) 05/16/2017 0937   MCH 20.8 (L) 09/09/2017 0432   MCHC 31.6 09/09/2017 0432   RDW 23.0 (H) 09/09/2017 0432   RDW 17.4 (H) 05/16/2017 0937   LYMPHSABS 1.2 09/08/2017 1325   LYMPHSABS 3.1 05/16/2017 0937   MONOABS 1.2 (H) 09/08/2017 1325   MONOABS 0.8 05/16/2017 0937   EOSABS 0.0 09/08/2017 1325   EOSABS 0.1 05/16/2017 0937   BASOSABS 0.0 09/08/2017 1325   BASOSABS 0.0 05/16/2017 0937    Brief HPI:  COSIMA PRENTISS an 57 y.o.femalewith RA on immunosuppressives, chronic pain treated with narcotics, hypertension, DM presenting with worsening back pain for 2 weeks. She had pain in the right GT and left hand as well and presented to the ED with confusion, very dark urine, discoloration of the rt GT, anorexia and was found to be in AKI with an initial ED BUN/Cr of 69/3.36 which has steadily worsened. She has gotten 10L of isotonic fluid and yet her renal function has continued to worsen w/ hypotension noted and poor uop. Her lactate and procalcitonin were both elevated. Her Cr which was 0.8 03/06/2017 increased to 3.36 on 3/29 and has steadily increased to 5.92 with a marked decrease in UOP.  She was started on CVVHD on 09/07/17.    Assessment/Plan:  1. ARF, oliguric- presumably due to ischemic ATN in setting of sepsis and hypotension, however pt with evidence of disseminated MSSA septicemia with osler's node and splinter hemorrhages.  Started on  CVVHD 09/07/17 and tolerating it well.  minimal urine output and appear bloody.  Consider CT of abdomen/pelvis w/o contrast to r/o renal infarcts.  Continue with CVVHD for now given tachycardia and decline in  clinical condition. 1. nsg staff asked about changing dialysate fluids as she is on Ok/2.5Ca with added 1mEq KCl per liter but was ordered 4K/2.5 Ca.  Not sure why she is on this special mixture, will discuss with pharmacy. 2. MSSA septicemia- concerning for metastatic seeding of joints/spine.  ID following and TTE ordered and was negative for vegetation and normal EF and valvular function. On Daptomycin per ID.  Other workup of infected toe underway and ortho following. 3. Abnormal LFT's- ?shock liver or direct involvement of infection.  Liver US negative.  Will need to recheck liver tests in am. 4. Anemia of acute illness- follow and transfuse prn 5. Vascular access- RIJ tdc placed 09/07/17 6. Rheumatoid arthritis 7. Left wrist pain- was for IR for aspiration for cellcount/diff and crystal analysis. 8. Thalassemia 9. Hypoalbuminemia- due to sepsis 10. Disposition- hopefully will remain stable and can transition to IHD if no improvement in UOP.  Donetta Potts, MD [employer] [phone]

## 2017-09-09 NOTE — Progress Notes (Signed)
Patient seems to be doing more poorly.  She interacts on a very limited basis, is now mostly nonverbal.  On exam, her right great toe has desquamated the skin, there may be some fluctuance in the underlying soft tissue as well.  The right wrist is still not bad, although the left wrist has a fairly significant amount of soft tissue swelling with cellulitis, I am not sure if I feel a fluid collection or not.  Impression: Systemic sepsis with multi organ shutdown, appears to be seeding her extremities with bacteria as evidenced by discoloration in her nailbeds, as well as swelling and cellulitis in the soft tissues.  Plan this is a difficult life-threatening condition.  I am not sure that surgical intervention would even be tolerated, she has not even been able to get off of the dialysis as her blood pressures have decreased, it would also be nice to have a definitive imaging study whether or not there is abscess formation in the left wrist or the right great toe.  Having said that, I think these are the effect of her problem, and not the cause, she certainly may have a more central systemic source.  I would defer to the internal medicine, intensive care, and infectious disease physicians regarding the next best course of action, but I am available to help surgically if needed.  Johnny Bridge, MD

## 2017-09-09 NOTE — Progress Notes (Signed)
Pharmacy Antibiotic Note  Jessica Howard is a 57 y.o. female admitted on 08/12/2017 with MSSA bacteremia.  Pharmacy has been consulted for daptomycin dosing. Pt previously on vancomycin but transitioned to dapto in setting of AKI and CVVHD, avoiding transition to Ancef given history of cephalosporin/PCN allergy.   Repeat blood cultures 1 of 2 with MSSA. Pt will need line holiday and MRI as able. Per ID, pt likely has lumbar and L wrist involvement - planning aspiration per IR this evening.  Plan: -Continue daptomycin 8mg /kg q48h -Follow cultures, imaging, renal funx, ID recs -CK q72h  Height: 5\' 2"  (157.5 cm) Weight: 277 lb 12.5 oz (126 kg) IBW/kg (Calculated) : 50.1  Temp (24hrs), Avg:96.9 F (36.1 C), Min:96.1 F (35.6 C), Max:97.9 F (36.6 C)  Recent Labs  Lab 08/24/2017 0632 08/13/2017 0806 08/17/2017 1006 09/06/2017 1526  08/18/2017 2001 09/06/17 0310 09/06/17 2349 09/07/17 1649 09/08/17 0543 09/08/17 1325 09/09/17 0432  WBC 25.1*  --   --   --   --   --  24.0* 23.8*  --   --  24.1* 23.6*  CREATININE 3.36*  --   --   --    < > 3.91* 4.34* 5.92* 6.65* 4.28*  --  2.55*  LATICACIDVEN  --  2.58* 2.59* 2.1*  --  1.9  --   --   --   --   --   --    < > = values in this interval not displayed.    Estimated Creatinine Clearance: 31.7 mL/min (A) (by C-G formula based on SCr of 2.55 mg/dL (H)).    Allergies  Allergen Reactions  . Cefuroxime Axetil Other (See Comments)    Blistering wounds per family  . Cephalosporins Other (See Comments)    Blistering wounds as an adult per family  . Bactrim [Sulfamethoxazole-Trimethoprim] Hives  . Iohexol Itching and Rash     Code: RASH, Desc: HAD ITCHING AND A RASH ABOUT ONE HOUR AFTER RETURNING HOME FROM THE CT, Onset Date: [id number]   . Lisinopril Cough  . Penicillins Hives  . Sulfa Antibiotics Hives    Antimicrobials this admission: 3/29 Vanc>>3/30 3/30 Aztreonam x 1  3/30 Dapto >>   Dose adjustments this  admission: none  Microbiology results: 3/31 BCx: 1o2 S. aueus 3/29 BCx 4/4: MSSA  3/29 UCx 50k MSSA   Thank you for allowing pharmacy to be a part of this patient's care.  Einar Grad 09/09/2017 9:42 AM

## 2017-09-09 NOTE — Progress Notes (Signed)
Subjective: Pt confused today   Antibiotics:  Anti-infectives (From admission, onward)   Start     Dose/Rate Route Frequency Ordered Stop   09/08/17 1600  DAPTOmycin (CUBICIN) 1,000 mg in sodium chloride 0.9 % IVPB     1,000 mg 240 mL/hr over 30 Minutes Intravenous Every 48 hours 09/07/17 1019     09/07/17 1000  vancomycin (VANCOCIN) 1,500 mg in sodium chloride 0.9 % 500 mL IVPB  Status:  Discontinued     1,500 mg 250 mL/hr over 120 Minutes Intravenous Every 48 hours 08/15/2017 0906 09/06/17 0935   09/06/17 1600  DAPTOmycin (CUBICIN) 984 mg in sodium chloride 0.9 % IVPB  Status:  Discontinued     8 mg/kg  123 kg 239.4 mL/hr over 30 Minutes Intravenous Every 48 hours 09/06/17 1436 09/07/17 1019   09/06/17 1000  aztreonam (AZACTAM) 0.5 g in dextrose 5 % 50 mL IVPB  Status:  Discontinued     0.5 g 100 mL/hr over 30 Minutes Intravenous Every 8 hours 09/06/17 0943 09/06/17 1223   08/12/2017 2000  metroNIDAZOLE (FLAGYL) IVPB 500 mg  Status:  Discontinued     500 mg 100 mL/hr over 60 Minutes Intravenous Every 8 hours 08/30/2017 1343 09/06/17 0028   08/28/2017 0900  aztreonam (AZACTAM) 2 g in sodium chloride 0.9 % 100 mL IVPB  Status:  Discontinued     2 g 200 mL/hr over 30 Minutes Intravenous Every 8 hours 08/16/2017 0853 09/06/17 0028   08/26/2017 0900  metroNIDAZOLE (FLAGYL) IVPB 500 mg     500 mg 100 mL/hr over 60 Minutes Intravenous  Once 08/17/2017 0853 08/31/2017 1035   08/17/2017 0830  piperacillin-tazobactam (ZOSYN) IVPB 3.375 g  Status:  Discontinued     3.375 g 100 mL/hr over 30 Minutes Intravenous  Once 08/22/2017 0816 08/17/2017 0836   09/04/2017 0830  vancomycin (VANCOCIN) IVPB 1000 mg/200 mL premix  Status:  Discontinued     1,000 mg 200 mL/hr over 60 Minutes Intravenous  Once 08/28/2017 0816 08/11/2017 0822   08/15/2017 0830  vancomycin (VANCOCIN) 2,000 mg in sodium chloride 0.9 % 500 mL IVPB     2,000 mg 250 mL/hr over 120 Minutes Intravenous  Once 08/16/2017 0822 08/14/2017 1342       Medications: Scheduled Meds: . Chlorhexidine Gluconate Cloth  6 each Topical Daily  . enoxaparin (LOVENOX) injection  30 mg Subcutaneous Q24H  . insulin aspart  0-15 Units Subcutaneous TID WC  . mouth rinse  15 mL Mouth Rinse BID  . metoprolol tartrate  5 mg Intravenous Q6H  . sodium chloride flush  3 mL Intravenous Q12H   Continuous Infusions: . sodium chloride 10 mL/hr at 09/09/17 0911  . amiodarone 30 mg/hr (09/09/17 0016)  . DAPTOmycin (CUBICIN)  IV Stopped (09/08/17 1723)  . dextrose 5 % and 0.9% NaCl 30 mL/hr at 09/08/17 0746  . dialysate (PRISMASATE) 1,500 mL/hr at 09/09/17 0845  . dialysis replacement fluid (prismasate) 500 mL/hr at 09/09/17 0845  . dialysis replacement fluid (prismasate) 300 mL/hr at 09/09/17 0154   PRN Meds:.acetaminophen **OR** acetaminophen, heparin, morphine injection, ondansetron **OR** ondansetron (ZOFRAN) IV    Objective: Weight change: 6 lb 9.8 oz (3 kg)  Intake/Output Summary (Last 24 hours) at 09/09/2017 1047 Last data filed at 09/09/2017 1000 Gross per 24 hour  Intake 1512.42 ml  Output 1636 ml  Net -123.58 ml   Blood pressure 118/79, pulse (!) 128, temperature 97.9 F (36.6 C), temperature source Oral, resp. rate Marland Kitchen)  26, height 5\' 2"  (1.575 m), weight 277 lb 12.5 oz (126 kg), last menstrual period 12/27/2013, SpO2 91 %. Temp:  [96.1 F (35.6 C)-97.9 F (36.6 C)] 97.9 F (36.6 C) (04/02 0730) Pulse Rate:  [57-139] 128 (04/02 1000) Resp:  [7-28] 26 (04/02 1000) BP: (90-152)/(49-120) 118/79 (04/02 1000) SpO2:  [84 %-100 %] 91 % (04/02 1000) Weight:  [277 lb 12.5 oz (126 kg)] 277 lb 12.5 oz (126 kg) (04/02 0449)  Physical Exam: General: arousable but confused Getting CVVHD folllowing commands recognizes family members HEENT: anicteric sclera, pupils reactive to light and accommodation, EOMI CVS regular rate, normal r,  no murmur rubs or gallops Chest: clear to auscultation bilaterally, no wheezing, rales or rhonchi Abdomen: soft  nontender, nondistended, normal bowel sounds, Extremities/skin:  Skin possible Osler's node 09/08/17. Not changed todays exam 09/09/17    Splinter hemmorhages 09/08/17      Hyperpigmented lesions: ? Janeway's chronicity uncertain     Toe swollen and tender 09/08/17:       Neuro: nonfocal  CBC:    BMET Recent Labs    09/08/17 0543 09/09/17 0432  NA 138 136  K 4.2 4.0  CL 102 99*  CO2 20* 22  GLUCOSE 75 150*  BUN 55* 32*  CREATININE 4.28* 2.55*  CALCIUM 7.0* 8.1*     Liver Panel  Recent Labs    09/06/17 2349  09/08/17 0543 09/09/17 0432  PROT 6.2*  --   --  6.9  ALBUMIN 1.7*   < > 1.6* 1.5*  1.5*  AST 114*  --   --  102*  ALT 34  --   --  30  ALKPHOS 382*  --   --  526*  BILITOT 5.4*  --   --  5.4*  BILIDIR  --   --   --  3.7*  IBILI  --   --   --  1.7*   < > = values in this interval not displayed.       Sedimentation Rate No results for input(s): ESRSEDRATE in the last 72 hours. C-Reactive Protein No results for input(s): CRP in the last 72 hours.  Micro Results: Recent Results (from the past 720 hour(s))  Blood Culture (routine x 2)     Status: Abnormal   Collection Time: 08/20/2017  9:50 AM  Result Value Ref Range Status   Specimen Description BLOOD RIGHT HAND  Final   Special Requests   Final    BOTTLES DRAWN AEROBIC AND ANAEROBIC Blood Culture adequate volume   Culture  Setup Time   Final    GRAM POSITIVE COCCI IN BOTH AEROBIC AND ANAEROBIC BOTTLES CRITICAL RESULT CALLED TO, READ BACK BY AND VERIFIED WITH: G ABBOTT 09/06/17 0006 JDW    Culture (A)  Final    STAPHYLOCOCCUS AUREUS SUSCEPTIBILITIES PERFORMED ON PREVIOUS CULTURE WITHIN THE LAST 5 DAYS. Performed at [facility], [place] [address] [place], [place] [address]    Report Status 09/08/2017 FINAL  Final  Blood Culture (routine x 2)     Status: Abnormal   Collection Time: 08/26/2017  1:54 PM  Result Value Ref Range Status   Specimen Description BLOOD RIGHT FOREARM  Final     Special Requests   Final    BOTTLES DRAWN AEROBIC AND ANAEROBIC Blood Culture adequate volume   Culture  Setup Time   Final    GRAM POSITIVE COCCI IN BOTH AEROBIC AND ANAEROBIC BOTTLES CRITICAL RESULT CALLED TO, READ BACK BY AND VERIFIED WITH:  [facility] 09/06/17 0006 JDW Performed at [facility] [address] [place], [place] [address]    Culture STAPHYLOCOCCUS AUREUS (A)  Final   Report Status 09/08/2017 FINAL  Final   Organism ID, Bacteria STAPHYLOCOCCUS AUREUS  Final      Susceptibility   Staphylococcus aureus - MIC*    CIPROFLOXACIN <=0.5 SENSITIVE Sensitive     ERYTHROMYCIN <=0.25 SENSITIVE Sensitive     GENTAMICIN <=0.5 SENSITIVE Sensitive     OXACILLIN 0.5 SENSITIVE Sensitive     TETRACYCLINE <=1 SENSITIVE Sensitive     VANCOMYCIN 1 SENSITIVE Sensitive     TRIMETH/SULFA <=10 SENSITIVE Sensitive     CLINDAMYCIN <=0.25 SENSITIVE Sensitive     RIFAMPIN <=0.5 SENSITIVE Sensitive     Inducible Clindamycin NEGATIVE Sensitive     * STAPHYLOCOCCUS AUREUS  Blood Culture ID Panel (Reflexed)     Status: Abnormal   Collection Time: 08/29/2017  1:54 PM  Result Value Ref Range Status   Enterococcus species NOT DETECTED NOT DETECTED Final   Listeria monocytogenes NOT DETECTED NOT DETECTED Final   Staphylococcus species DETECTED (A) NOT DETECTED Final    Comment: CRITICAL RESULT CALLED TO, READ BACK BY AND VERIFIED WITH: G ABBOTT PHARMD 09/06/17 0006 JDW    Staphylococcus aureus DETECTED (A) NOT DETECTED Final    Comment: Methicillin (oxacillin) susceptible Staphylococcus aureus (MSSA). Preferred therapy is anti staphylococcal beta lactam antibiotic (Cefazolin or Nafcillin), unless clinically contraindicated. CRITICAL RESULT CALLED TO, READ BACK BY AND VERIFIED WITH: G ABBOTT PHARMD 09/06/17 0006 JDW    Methicillin resistance NOT DETECTED NOT DETECTED Final   Streptococcus species NOT DETECTED NOT DETECTED Final   Streptococcus agalactiae NOT DETECTED NOT DETECTED Final    Streptococcus pneumoniae NOT DETECTED NOT DETECTED Final   Streptococcus pyogenes NOT DETECTED NOT DETECTED Final   Acinetobacter baumannii NOT DETECTED NOT DETECTED Final   Enterobacteriaceae species NOT DETECTED NOT DETECTED Final   Enterobacter cloacae complex NOT DETECTED NOT DETECTED Final   Escherichia coli NOT DETECTED NOT DETECTED Final   Klebsiella oxytoca NOT DETECTED NOT DETECTED Final   Klebsiella pneumoniae NOT DETECTED NOT DETECTED Final   Proteus species NOT DETECTED NOT DETECTED Final   Serratia marcescens NOT DETECTED NOT DETECTED Final   Haemophilus influenzae NOT DETECTED NOT DETECTED Final   Neisseria meningitidis NOT DETECTED NOT DETECTED Final   Pseudomonas aeruginosa NOT DETECTED NOT DETECTED Final   Candida albicans NOT DETECTED NOT DETECTED Final   Candida glabrata NOT DETECTED NOT DETECTED Final   Candida krusei NOT DETECTED NOT DETECTED Final   Candida parapsilosis NOT DETECTED NOT DETECTED Final   Candida tropicalis NOT DETECTED NOT DETECTED Final    Comment: Performed at [facility], [place] [address], [place], [place] [address]  Urine culture     Status: Abnormal   Collection Time: 08/24/2017  3:56 PM  Result Value Ref Range Status   Specimen Description URINE, RANDOM  Final   Special Requests   Final    NONE Performed at [facility], [place] [address] [place], [place] [address]    Culture 50,000 COLONIES/mL STAPHYLOCOCCUS AUREUS (A)  Final   Report Status 09/07/2017 FINAL  Final   Organism ID, Bacteria STAPHYLOCOCCUS AUREUS (A)  Final      Susceptibility   Staphylococcus aureus - MIC*    CIPROFLOXACIN <=0.5 SENSITIVE Sensitive     GENTAMICIN <=0.5 SENSITIVE Sensitive     NITROFURANTOIN 32 SENSITIVE Sensitive     OXACILLIN 0.5  SENSITIVE Sensitive     TETRACYCLINE <=1 SENSITIVE Sensitive     VANCOMYCIN 1 SENSITIVE Sensitive     TRIMETH/SULFA <=10 SENSITIVE Sensitive     CLINDAMYCIN <=0.25 SENSITIVE Sensitive     RIFAMPIN <=0.5 SENSITIVE  Sensitive     Inducible Clindamycin NEGATIVE Sensitive     * 50,000 COLONIES/mL STAPHYLOCOCCUS AUREUS  MRSA PCR Screening     Status: None   Collection Time: 09/06/17  5:20 AM  Result Value Ref Range Status   MRSA by PCR NEGATIVE NEGATIVE Final    Comment:        The GeneXpert MRSA Assay (FDA approved for NASAL specimens only), is one component of a comprehensive MRSA colonization surveillance program. It is not intended to diagnose MRSA infection nor to guide or monitor treatment for MRSA infections. Performed at [facility], [place] [address], [place], [place] [address]   Culture, blood (routine x 2)     Status: Abnormal   Collection Time: 09/07/17  7:09 AM  Result Value Ref Range Status   Specimen Description BLOOD RIGHT HAND  Final   Special Requests AEROBIC BOTTLE ONLY Blood Culture adequate volume  Final   Culture  Setup Time   Final    GRAM POSITIVE COCCI AEROBIC BOTTLE ONLY CRITICAL VALUE NOTED.  VALUE IS CONSISTENT WITH PREVIOUSLY REPORTED AND CALLED VALUE.    Culture (A)  Final    STAPHYLOCOCCUS AUREUS SUSCEPTIBILITIES PERFORMED ON PREVIOUS CULTURE WITHIN THE LAST 5 DAYS. Performed at [facility], [place] [address] [place], [place] [address]    Report Status 09/09/2017 FINAL  Final  Culture, blood (routine x 2)     Status: None (Preliminary result)   Collection Time: 09/07/17  7:17 AM  Result Value Ref Range Status   Specimen Description BLOOD LEFT ANTECUBITAL  Final   Special Requests   Final    IN BOTH AEROBIC AND ANAEROBIC BOTTLES Blood Culture results may not be optimal due to an excessive volume of blood received in culture bottles   Culture   Final    NO GROWTH 2 DAYS Performed at [facility], [place] [address], [place], [place] [address]    Report Status PENDING  Incomplete  Aerobic Culture (superficial specimen)     Status: None (Preliminary result)   Collection Time: 09/08/17  3:10 PM  Result Value Ref Range Status   Specimen  Description WOUND RIGHT TOE  Final   Special Requests   Final    NONE Performed at [facility], [place] [address] [place], [place] [address]    Gram Stain   Final    RARE WBC PRESENT, PREDOMINANTLY MONONUCLEAR FEW GRAM POSITIVE COCCI ABUNDANT SQUAMOUS EPITHELIAL CELLS PRESENT    Culture PENDING  Incomplete   Report Status PENDING  Incomplete    Studies/Results: Dg Chest Port 1 View  Result Date: 09/07/2017 CLINICAL DATA:  Central line placement. EXAM: PORTABLE CHEST 1 VIEW COMPARISON:  08/15/2017 and prior radiographs FINDINGS: This is a mildly low volume film. A RIGHT IJ central venous catheter is noted with tip overlying the UPPER RIGHT atrium. There is no evidence of pneumothorax. Cardiomegaly and pulmonary vascular congestion again noted. IMPRESSION: RIGHT IJ central venous catheter with tip overlying the UPPER RIGHT atrium, in this low volume film. No pneumothorax. Cardiomegaly and pulmonary vascular congestion. Electronically Signed   By: Margarette Canada M.D.   On: 09/07/2017 14:34      Assessment/Plan:  INTERVAL HISTORY:   Pt seen  by Dr. 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 female with  RA  Admitted with MSSA B with  septic shock, ARF, shock liver with evidence of endocarditis and "metastatic infection: with likely left septic wrist, infected right toe, and possible Lumbar spine infection  #1 MSSAB with shock metastatic disease:       [facility]icrobial Management Team Staphylococcus aureus bacteremia   Staphylococcus aureus bacteremia (SAB) is associated with a high rate of complications and mortality.  Specific aspects of clinical management are critical to optimizing the outcome of patients with SAB.  Therefore, the [facility] Antimicrobial Management Team [facility]) has initiated an intervention aimed at improving the management of SAB at [facility].  To do so, Infectious Diseases physicians are providing an evidence-based consult for the management of all patients with SAB.     Yes No Comments  Perform follow-up blood cultures (even if the patient is afebrile) to ensure clearance of bacteremia [x]  []  09/07/17 cxs still positive, she will need repeat blood cultures AFTER HD line holiday  Remove vascular catheter and obtain follow-up blood cultures after the removal of the catheter []  []  HD line holiday when possible  Perform echocardiography to evaluate for endocarditis (transthoracic ECHO is 40-50% sensitive, TEE is > 90% sensitive) []  []  Please keep in mind, that neither test can definitively EXCLUDE endocarditis, and that should clinical suspicion remain high for endocarditis the patient should then still be treated with an "endocarditis" duration of therapy = 6 weeks  She is going to get treated for 6-8 weeks but we will want a TEE along the way as well if possible  Consult electrophysiologist to evaluate implanted cardiac device (pacemaker, ICD) []  []    Ensure source control []  []  Have all abscesses been drained effectively? Have deep seeded infections (septic joints or osteomyelitis) had appropriate surgical debridement?  I am worried that she needs more interventions specifically at wrist and  possibly foot  Investigate for "metastatic" sites of infection []  []  Does the patient have ANY symptom or physical exam finding that would suggest a deeper infection (back or neck pain that may be suggestive of vertebral osteomyelitis or epidural abscess, muscle pain that could be a symptom of pyomyositis)?  Keep in mind that for deep seeded infections MRI imaging with contrast is preferred rather than other often insensitive tests such as plain x-rays, especially early in a patient's presentation.  Greatly appreciate orthopedic consultation agree with IR aspirate of wrist, also would get MRI of left wrist WO gad, right toe and foot WO gad and L spine   Change antibiotic therapy to daptomycin []  []  Beta-lactam antibiotics are preferred for MSSA due to higher cure rates.  If on Vancomycin, goal trough should be 15 - 20 mcg/mL  Estimated duration of IV antibiotic therapy:  8 weeks []  []  Consult case management for probably prolonged outpatient IV antibiotic therapy   #2 Left wrist pain:  IF IR gets fluid for analysis HIGHEST PRIORITY is for   #1 cell count and differential with crystals #2 Culture  Typically cultures not NEARLY as sensitive esp w pt ON ABX for diagnosing septic joint. The WBC in joint more helpful here  #3 Toe infection: Dr. Mardelle Matte believes foot infection confined to small toe soft tissue. When stable I would still get an MRI so we dont miss something here though  #4 LBP: needs MRI L spine when able  #5 Neuro: confusion: more likely due to multifactorial problems but could have septic emboli to CNS. She does not have focal deficits at this time.  I spent greater than 35  minutes with the patient including greater than 50% of time in face to face counsel of the patient  And her family member re her MSSAB with shock and likely endocarditis her confusion and in coordination of her care with primary team.   LOS: 4 days   Alcide Evener 09/09/2017, 10:47 AM

## 2017-09-09 NOTE — Progress Notes (Signed)
Placed pt on CPAP auto titrate tolerating well with 4 lpm bled in.

## 2017-09-09 NOTE — Progress Notes (Signed)
HR afib in 130's. MD aware, pt on amio and in no distress. No new orders at this time, RN will continue to monitor.

## 2017-09-10 ENCOUNTER — Inpatient Hospital Stay (HOSPITAL_COMMUNITY): Payer: Medicare Other

## 2017-09-10 DIAGNOSIS — R238 Other skin changes: Secondary | ICD-10-CM

## 2017-09-10 DIAGNOSIS — R440 Auditory hallucinations: Secondary | ICD-10-CM

## 2017-09-10 DIAGNOSIS — L089 Local infection of the skin and subcutaneous tissue, unspecified: Secondary | ICD-10-CM

## 2017-09-10 DIAGNOSIS — A419 Sepsis, unspecified organism: Secondary | ICD-10-CM

## 2017-09-10 DIAGNOSIS — I4892 Unspecified atrial flutter: Secondary | ICD-10-CM

## 2017-09-10 DIAGNOSIS — R4182 Altered mental status, unspecified: Secondary | ICD-10-CM

## 2017-09-10 LAB — RENAL FUNCTION PANEL
ALBUMIN: 1.4 g/dL — AB (ref 3.5–5.0)
ANION GAP: 15 (ref 5–15)
ANION GAP: 12 (ref 5–15)
Albumin: 1.5 g/dL — ABNORMAL LOW (ref 3.5–5.0)
BUN: 24 mg/dL — ABNORMAL HIGH (ref 6–20)
BUN: 25 mg/dL — ABNORMAL HIGH (ref 6–20)
CALCIUM: 8.1 mg/dL — AB (ref 8.9–10.3)
CO2: 22 mmol/L (ref 22–32)
CO2: 22 mmol/L (ref 22–32)
CREATININE: 2.32 mg/dL — AB (ref 0.44–1.00)
Calcium: 8.4 mg/dL — ABNORMAL LOW (ref 8.9–10.3)
Chloride: 98 mmol/L — ABNORMAL LOW (ref 101–111)
Chloride: 101 mmol/L (ref 101–111)
Creatinine, Ser: 2.31 mg/dL — ABNORMAL HIGH (ref 0.44–1.00)
GFR calc Af Amer: 26 mL/min — ABNORMAL LOW (ref >60)
GFR calc non Af Amer: 23 mL/min — ABNORMAL LOW (ref >60)
GFR calc non Af Amer: 23 mL/min — ABNORMAL LOW (ref >60)
GFR, EST AFRICAN AMERICAN: 26 mL/min — AB (ref >60)
GLUCOSE: 215 mg/dL — AB (ref 65–99)
Glucose, Bld: 187 mg/dL — ABNORMAL HIGH (ref 65–99)
PHOSPHORUS: 4.1 mg/dL (ref 2.5–4.6)
PHOSPHORUS: 4.3 mg/dL (ref 2.5–4.6)
POTASSIUM: 3.9 mmol/L (ref 3.5–5.1)
Potassium: 3.5 mmol/L (ref 3.5–5.1)
SODIUM: 135 mmol/L (ref 135–145)
Sodium: 135 mmol/L (ref 135–145)

## 2017-09-10 LAB — AEROBIC CULTURE W GRAM STAIN (SUPERFICIAL SPECIMEN)

## 2017-09-10 LAB — GLUCOSE, CAPILLARY
Glucose-Capillary: 155 mg/dL — ABNORMAL HIGH (ref 65–99)
Glucose-Capillary: 164 mg/dL — ABNORMAL HIGH (ref 65–99)
Glucose-Capillary: 173 mg/dL — ABNORMAL HIGH (ref 65–99)
Glucose-Capillary: 177 mg/dL — ABNORMAL HIGH (ref 65–99)
Glucose-Capillary: 182 mg/dL — ABNORMAL HIGH (ref 65–99)
Glucose-Capillary: 204 mg/dL — ABNORMAL HIGH (ref 65–99)

## 2017-09-10 LAB — CBC
HCT: 28 % — ABNORMAL LOW (ref 36.0–46.0)
Hemoglobin: 9.2 g/dL — ABNORMAL LOW (ref 12.0–15.0)
MCH: 21.5 pg — ABNORMAL LOW (ref 26.0–34.0)
MCHC: 32.9 g/dL (ref 30.0–36.0)
MCV: 65.4 fL — ABNORMAL LOW (ref 78.0–100.0)
PLATELETS: 352 10*3/uL (ref 150–400)
RBC: 4.28 MIL/uL (ref 3.87–5.11)
RDW: 23.4 % — AB (ref 11.5–15.5)
WBC: 25.3 10*3/uL — AB (ref 4.0–10.5)

## 2017-09-10 LAB — HEPARIN LEVEL (UNFRACTIONATED): Heparin Unfractionated: 0.19 IU/mL — ABNORMAL LOW (ref 0.30–0.70)

## 2017-09-10 LAB — AEROBIC CULTURE  (SUPERFICIAL SPECIMEN)

## 2017-09-10 LAB — CK: Total CK: 131 U/L (ref 38–234)

## 2017-09-10 LAB — MAGNESIUM: Magnesium: 2.7 mg/dL — ABNORMAL HIGH (ref 1.7–2.4)

## 2017-09-10 MED ORDER — LORAZEPAM 2 MG/ML IJ SOLN
INTRAMUSCULAR | Status: AC
  Filled 2017-09-10: qty 1

## 2017-09-10 MED ORDER — LORAZEPAM 2 MG/ML IJ SOLN
1.0000 mg | Freq: Once | INTRAMUSCULAR | Status: AC
  Administered 2017-09-10: 1 mg via INTRAVENOUS

## 2017-09-10 MED ORDER — METOPROLOL TARTRATE 5 MG/5ML IV SOLN
10.0000 mg | Freq: Four times a day (QID) | INTRAVENOUS | Status: DC
  Administered 2017-09-10 – 2017-09-11 (×6): 10 mg via INTRAVENOUS
  Filled 2017-09-10 (×8): qty 10

## 2017-09-10 MED ORDER — PRISMASOL BGK 4/2.5 32-4-2.5 MEQ/L IV SOLN
INTRAVENOUS | Status: DC
  Administered 2017-09-10 – 2017-09-11 (×5): via INTRAVENOUS_CENTRAL
  Filled 2017-09-10 (×14): qty 5000

## 2017-09-10 MED ORDER — PRISMASOL BGK 0/2.5 32-2.5 MEQ/L IV SOLN
INTRAVENOUS | Status: DC
  Filled 2017-09-10 (×3): qty 5000

## 2017-09-10 MED ORDER — LACTULOSE 10 GM/15ML PO SOLN
10.0000 g | Freq: Every day | ORAL | Status: DC
  Administered 2017-09-10 – 2017-09-22 (×9): 10 g via ORAL
  Filled 2017-09-10 (×13): qty 15

## 2017-09-10 MED ORDER — HEPARIN (PORCINE) IN NACL 100-0.45 UNIT/ML-% IJ SOLN
1450.0000 [IU]/h | INTRAMUSCULAR | Status: AC
  Administered 2017-09-10: 1200 [IU]/h via INTRAVENOUS
  Filled 2017-09-10: qty 250

## 2017-09-10 MED ORDER — HALOPERIDOL LACTATE 5 MG/ML IJ SOLN
5.0000 mg | Freq: Once | INTRAMUSCULAR | Status: AC
  Administered 2017-09-10: 5 mg via INTRAVENOUS
  Filled 2017-09-10: qty 1

## 2017-09-10 NOTE — Progress Notes (Signed)
ANTICOAGULATION CONSULT NOTE - Follow Up Consult  Pharmacy Consult for Heparin Indication: atrial fibrillation  Allergies  Allergen Reactions  . Cefuroxime Axetil Other (See Comments)    Blistering wounds per family  . Cephalosporins Other (See Comments)    Blistering wounds as an adult per family  . Bactrim [Sulfamethoxazole-Trimethoprim] Hives  . Iohexol Itching and Rash     Code: RASH, Desc: HAD ITCHING AND A RASH ABOUT ONE HOUR AFTER RETURNING HOME FROM THE CT, Onset Date: [id number]   . Lisinopril Cough  . Penicillins Hives  . Sulfa Antibiotics Hives    Patient Measurements: Height: 5\' 2"  (157.5 cm) Weight: 278 lb 7.1 oz (126.3 kg) IBW/kg (Calculated) : 50.1 Heparin Dosing Weight: 81 kg  Vital Signs: Temp: 98.4 F (36.9 C) (04/03 1932) Temp Source: Oral (04/03 1932) BP: 124/104 (04/03 2000) Pulse Rate: 130 (04/03 2000)  Labs: Recent Labs    09/08/17 1325 09/09/17 0432 09/09/17 1004 09/09/17 1600 09/10/17 0434 09/10/17 0436 09/10/17 1806 09/10/17 1841  HGB 8.1* 8.6*  --   --  9.2*  --   --   --   HCT 25.7* 27.2*  --   --  28.0*  --   --   --   PLT 344 347  --   --  352  --   --   --   LABPROT  --   --  19.8*  --   --   --   --   --   INR  --   --  1.69  --   --   --   --   --   HEPARINUNFRC  --   --   --   --   --   --   --  0.19*  CREATININE  --  2.55*  --  2.47*  --  2.31* 2.32*  --   CKTOTAL  --   --   --   --  131  --   --   --     Estimated Creatinine Clearance: 34.9 mL/min (A) (by C-G formula based on SCr of 2.32 mg/dL (H)).   Assessment: 45 yoF found to have disseminated MSSA bacteremia now with rapid AFib to start on IV heparin. Of note, pt received enoxaparin SQ at 2050 last night. CBC improving and per RN hematuria has resolved and pt is not receiving heparin through CRRT circuit.  Heparin level is low at 0.19 on 1200 units/hr.  No bleeding or issues reported.   Goal of Therapy:  Heparin level 0.3-0.7 units/ml Monitor platelets by  anticoagulation protocol: Yes   Plan:  Increase heparin to 1450 units/hr.  Stop heparin at midnight per prior orders for dental surgery in AM.  Follow-up anticoagulation plans post-operatively tomorrow.  Sloan Leiter, PharmD, BCPS, BCCCP Clinical Pharmacist Clinical phone 09/10/2017 until 11PM [phone] After hours, please call [phone] 09/10/2017,8:31 PM

## 2017-09-10 NOTE — Progress Notes (Signed)
  Speech Language Pathology Treatment: Dysphagia  Patient Details Name: Jessica Howard MRN: [id number] DOB: [birth year] Today's Date: 09/10/2017 Time: 9373-4287 SLP Time Calculation (min) (ACUTE ONLY): 10 min  Assessment / Plan / Recommendation Clinical Impression  Pt is alert today although confused and not recalling events from previous days well. Her sister shared that she ate small amounts of food off her meal trays so far, describing mostly pureed foods as well (applesauce, ice cream). Pt consumed soft solids and thin liquids with Min cues from SLP for lingual sweep and liquid wash for clearance of mild, generalized residue. She does still have grunting post-swallow with thin liquids, but this more forceful exhale suggests adequate coordination of breath/swallow and also is clear in quality. Per initial evaluation, family reports this to be baseline behavior. Recommend continuing Dys 2 diet and thin liquids as previously recommended.   HPI HPI: Pt presents with MSSA bacteremia, hypotension, acute renal failure and hepatopathy. PMHx includes PNA, GERD, Rheumatoid Arthritis, and asthma. Currently on CRRT. CXR (3/31)was negative for pneumothorax with no concern for consolidation. Pt currently NPO following witnessed coughing with dinner tray. CT Head (3/29) revealed no acute intracranial abnormality.      SLP Plan  Continue with current plan of care       Recommendations  Diet recommendations: Dysphagia 2 (fine chop);Thin liquid Liquids provided via: Cup;Straw Medication Administration: Whole meds with liquid Supervision: Staff to assist with self feeding;Full supervision/cueing for compensatory strategies Compensations: Minimize environmental distractions;Slow rate;Small sips/bites Postural Changes and/or Swallow Maneuvers: Seated upright 90 degrees                Oral Care Recommendations: Oral care BID Follow up Recommendations: None SLP Visit Diagnosis: Dysphagia, oral phase  (R13.11) Plan: Continue with current plan of care       GO                Germain Osgood 09/10/2017, 9:20 AM  Germain Osgood, M.A. CCC-SLP [phone]

## 2017-09-10 NOTE — Progress Notes (Addendum)
Discussed case with the critical care physician, Dr. Vaughan Browner.  She has grown out gram-positive cocci from her toe and her left wrist, and may have involvement of the right wrist as well.  She would benefit from surgical irrigation and debridement of the great toe, as well as the left wrist, and we will plan for surgery tomorrow, it sounds like medically she may be optimized to be able to withstand surgery at that point.  She may also undergo trans esophageal echocardiogram, as well as the additional MRI studies once she is intubated, we will try and coordinate everything, but surgery is likely to be tomorrow in the late afternoon.  Will discuss further with the patient and the family.  Johnny Bridge, MD

## 2017-09-10 NOTE — Progress Notes (Signed)
Patient returned from MRI spine and foot done but unable to complete the wrist due to restless ness

## 2017-09-10 NOTE — Progress Notes (Addendum)
Patient continues to be restless in MRI scan despite 12.5 of Fentanyl Dr. Vaughan Browner informed ativan ordered.

## 2017-09-10 NOTE — Progress Notes (Signed)
S: more awake today and able to speak O:BP (!) 154/129   Pulse (!) 137   Temp 97.6 F (36.4 C) (Oral)   Resp (!) 24   Ht 5\' 2"  (1.575 m)   Wt 126.3 kg (278 lb 7.1 oz)   LMP 12/27/2013 Comment: irreggular  SpO2 99%   BMI 50.93 kg/m   Intake/Output Summary (Last 24 hours) at 09/10/2017 1101 Last data filed at 09/10/2017 1000 Gross per 24 hour  Intake 1230.4 ml  Output 1080 ml  Net 150.4 ml   Intake/Output: I/O last 3 completed shifts: In: 2036.9 [I.V.:1966.9; Other:70] Out: 2001 [Urine:107; XTKWI:0973]  Intake/Output this shift:  Total I/O In: 200.1 [P.O.:60; I.V.:140.1] Out: 115 [Urine:5; Other:110] Weight change: 0.3 kg (10.6 oz) Gen: obese AAF in bed, appears fatigued  ZHG:DJMEQ at 138 Resp:cta Abd: benign Ext: 1+ edema  Recent Labs  Lab 08/13/2017 0632 08/26/2017 1531 08/24/2017 2001 09/06/17 0310 09/06/17 2349 09/07/17 1649 09/08/17 0543 09/09/17 0432 09/09/17 1004 09/09/17 1600 09/10/17 0436  NA 132* 136 135 137 139 136 138 136  --  136 135  K 5.1 5.6* 6.0* 5.6* 5.0 5.2* 4.2 4.0  --  3.7 3.9  CL 97* 103 104 105 108 106 102 99*  --  100* 98*  CO2 18* 18* 15* 16* 15* 16* 20* 22  --  22 22  GLUCOSE 337* 298* 275* 190* 139* 91 75 150*  --  144* 187*  BUN 69* 69* 69* 74* 83* 84* 55* 32*  --  31* 24*  CREATININE 3.36* 3.75* 3.91* 4.34* 5.92* 6.65* 4.28* 2.55*  --  2.47* 2.31*  ALBUMIN 2.0* 1.8* 2.1* 2.0* 1.7* 1.6* 1.6* 1.5*  1.5* 1.5* 1.4* 1.5*  CALCIUM 7.6* 7.0* 7.0* 7.1* 6.5* 6.3* 7.0* 8.1*  --  7.9* 8.4*  PHOS 5.9*  --   --   --   --  8.1* 5.6* 4.7*  --  4.3 4.1  AST 53* 81* 87* 89* 114*  --   --  102* 98*  --   --   ALT 24 32 34 32 34  --   --  30 30  --   --    Liver Function Tests: Recent Labs  Lab 09/06/17 2349  09/09/17 0432 09/09/17 1004 09/09/17 1600 09/10/17 0436  AST 114*  --  102* 98*  --   --   ALT 34  --  30 30  --   --   ALKPHOS 382*  --  526* 541*  --   --   BILITOT 5.4*  --  5.4* 5.4*  --   --   PROT 6.2*  --  6.9 6.4*  --   --    ALBUMIN 1.7*   < > 1.5*  1.5* 1.5* 1.4* 1.5*   < > = values in this interval not displayed.   Recent Labs  Lab 08/19/2017 0815  LIPASE 28   Recent Labs  Lab 08/31/2017 0956 09/06/17 0310 09/09/17 1004  AMMONIA 38* 101* 44*   CBC: Recent Labs  Lab 08/12/2017 0632 09/06/17 0310 09/06/17 2349 09/08/17 1325 09/09/17 0432 09/10/17 0434  WBC 25.1* 24.0* 23.8* 24.1* 23.6* 25.3*  NEUTROABS 22.1*  --   --  21.7*  --   --   HGB 7.6* 8.9* 8.3* 8.1* 8.6* 9.2*  HCT 23.3* 27.5* 25.2* 25.7* 27.2* 28.0*  MCV 61.0* 65.0* 65.6* 66.6* 65.9* 65.4*  PLT 423* 439* 356 344 347 352   Cardiac Enzymes: Recent Labs  Lab 08/15/2017 [phone]  09/06/17 1030 09/10/17 0434  CKTOTAL 55 233 131   CBG: Recent Labs  Lab 09/09/17 1523 09/09/17 2027 09/09/17 2324 09/10/17 0334 09/10/17 0725  GLUCAP 135* 136* 145* 182* 164*    Iron Studies: No results for input(s): IRON, TIBC, TRANSFERRIN, FERRITIN in the last 72 hours. Studies/Results: Ct Abdomen Pelvis Wo Contrast  Result Date: 09/09/2017 CLINICAL DATA:  Bacteremia EXAM: CT ABDOMEN AND PELVIS WITHOUT CONTRAST TECHNIQUE: Multidetector CT imaging of the abdomen and pelvis was performed following the standard protocol without IV contrast. COMPARISON:  None. FINDINGS: Motion degraded images. Lower chest: Small right pleural effusion. Hepatobiliary: Liver is grossly unremarkable. Gallbladder is grossly unremarkable. No intrahepatic or extrahepatic ductal dilatation. Pancreas: Within normal limits. Spleen: Within normal limits. Adrenals/Urinary Tract: Adrenal glands within normal limits. Kidneys are within normal limits. No renal calculi or hydronephrosis. Bladder is decompressed by an indwelling Foley catheter. Stomach/Bowel: Stomach is within normal limits. No evidence of bowel obstruction. Normal appendix (series 9/image 55). Mild sigmoid diverticulosis, without evidence of diverticulitis. Vascular/Lymphatic: No evidence of abdominal aortic aneurysm.  Atherosclerotic calcifications of the abdominal aorta and branch vessels. No suspicious abdominopelvic lymphadenopathy. Reproductive: Uterus is within normal limits. Bilateral ovaries are within normal limits. Other: No abdominopelvic ascites. Musculoskeletal: Degenerative changes of the lower thoracic spine. IMPRESSION: No CT findings to account for the patient's bacteremia. Small right pleural effusion. Mild sigmoid diverticulosis, without evidence of diverticulitis. Bladder decompressed by indwelling Foley catheter. Electronically Signed   By: Julian Hy M.D.   On: 09/09/2017 15:26   Ct Head Wo Contrast  Result Date: 09/09/2017 CLINICAL DATA:  57 year old female with a history of altered mental status EXAM: CT HEAD WITHOUT CONTRAST TECHNIQUE: Contiguous axial images were obtained from the base of the skull through the vertex without intravenous contrast. COMPARISON:  None. FINDINGS: Brain: No acute intracranial hemorrhage. No midline shift or mass effect. Unremarkable appearance of the ventricles. Gray-white differentiation maintained. Vascular: Calcifications of the anterior circulation Skull: No acute displaced fracture. Sinuses/Orbits: Trace disease within the ethmoid air cells. Maxillary sinuses, frontal sinuses, and sphenoid sinuses patent Other: None IMPRESSION: CT negative for acute intracranial abnormality. Electronically Signed   By: Corrie Mckusick D.O.   On: 09/09/2017 15:23   Dg Chest Port 1 View  Result Date: 09/09/2017 CLINICAL DATA:  Altered mental status EXAM: PORTABLE CHEST 1 VIEW COMPARISON:  09/07/2017 FINDINGS: Right jugular dialysis catheter in the right atrium unchanged. Cardiac enlargement with vascular congestion unchanged. Negative for edema. Small right effusion. Mild right lower lobe airspace disease with mild progression, and may be atelectasis or infiltrate. IMPRESSION: Pulmonary vascular congestion unchanged Mild progression of right lower lobe airspace disease  Electronically Signed   By: Franchot Gallo M.D.   On: 09/09/2017 11:35   Dg Fluoro Guided Needle Plc Aspiration/injection Loc  Result Date: 09/09/2017 CLINICAL DATA:  Staph aureus bacteremia. Left wrist soft tissue swelling EXAM: Left WRIST  INJECTION UNDER FLUOROSCOPY FLUOROSCOPY TIME:  Fluoroscopy Time:  0 minutes 48 seconds Radiation Exposure Index (if provided by the fluoroscopic device): Number of Acquired Spot Images: 0 PROCEDURE: Diffuse soft tissue swelling around the wrist. Overlying skin prepped with Betadine, draped in the usual sterile fashion, and infiltrated locally with buffered Lidocaine. 21 gauge spinal needle advanced into the radiocarpal joint without difficulty. 0.5 mL of normal appearing joint fluid was aspirated and sent to the laboratory. Additional repositioning of the needle did not yield any further fluid. 1 mL of Isovue contrast injected into the needle confirming intra-articular location. IMPRESSION: Successful left  wrist arthrocentesis. No significant effusion. 0.5 mL of normal appearing joint fluid sent to the laboratory for analysis. Electronically Signed   By: Franchot Gallo M.D.   On: 09/09/2017 15:08   . Chlorhexidine Gluconate Cloth  6 each Topical Daily  . insulin aspart  0-15 Units Subcutaneous TID WC  . lactulose  10 g Oral Daily  . mouth rinse  15 mL Mouth Rinse BID  . metoprolol tartrate  10 mg Intravenous Q6H  . sodium chloride flush  3 mL Intravenous Q12H    BMET    Component Value Date/Time   NA 135 09/10/2017 0436   NA 142 03/06/2017 0929   K 3.9 09/10/2017 0436   K 4.1 03/06/2017 0929   CL 98 (L) 09/10/2017 0436   CL 101 02/13/2012 0850   CO2 22 09/10/2017 0436   CO2 23 03/06/2017 0929   GLUCOSE 187 (H) 09/10/2017 0436   GLUCOSE 129 03/06/2017 0929   GLUCOSE 529 (H) 02/13/2012 0850   BUN 24 (H) 09/10/2017 0436   BUN 17.1 03/06/2017 0929   CREATININE 2.31 (H) 09/10/2017 0436   CREATININE 0.8 03/06/2017 0929   CALCIUM 8.4 (L) 09/10/2017 0436    CALCIUM 9.6 03/06/2017 0929   GFRNONAA 23 (L) 09/10/2017 0436   GFRAA 26 (L) 09/10/2017 0436   CBC    Component Value Date/Time   WBC 25.3 (H) 09/10/2017 0434   RBC 4.28 09/10/2017 0434   HGB 9.2 (L) 09/10/2017 0434   HGB 9.4 (L) 05/16/2017 0937   HCT 28.0 (L) 09/10/2017 0434   HCT 30.4 (L) 05/16/2017 0937   PLT 352 09/10/2017 0434   PLT 266 05/16/2017 0937   MCV 65.4 (L) 09/10/2017 0434   MCV 64.4 (L) 05/16/2017 0937   MCH 21.5 (L) 09/10/2017 0434   MCHC 32.9 09/10/2017 0434   RDW 23.4 (H) 09/10/2017 0434   RDW 17.4 (H) 05/16/2017 0937   LYMPHSABS 1.2 09/08/2017 1325   LYMPHSABS 3.1 05/16/2017 0937   MONOABS 1.2 (H) 09/08/2017 1325   MONOABS 0.8 05/16/2017 0937   EOSABS 0.0 09/08/2017 1325   EOSABS 0.1 05/16/2017 0937   BASOSABS 0.0 09/08/2017 1325   BASOSABS 0.0 05/16/2017 0937    Brief FBP:ZWCHENI E Kennedyis an 57 y.o.femalewith RA on immunosuppressives, chronic pain treated with narcotics, hypertension, DM presenting with worsening back pain for 2 weeks. She had pain in the right GT and left hand as well and presented to the ED with confusion, very dark urine, discoloration of the rt GT, anorexia and was found to be in AKI with an initial ED BUN/Cr of 69/3.36 which has steadily worsened. She has gotten 10L of isotonic fluid and yet her renal function has continued to worsen w/ hypotension noted and poor uop. Her lactate and procalcitonin were both elevated. Her Cr which was 0.8 03/06/2017 increased to 3.36 on 3/29 and has steadily increased to 5.92 with a marked decrease in UOP.She was started on CVVHD on 09/07/17.  Assessment/Plan:  1. ARF, oliguric- presumably due to ischemic ATN in setting of sepsis and hypotension, however pt with evidence of disseminated MSSA septicemia with osler's node and splinter hemorrhages. Started on CVVHD 09/07/17 and tolerating it well. minimal urine output and appear bloody.  Consider CT of abdomen/pelvis w/o contrast to r/o renal  infarcts. Continue with CVVHD for now given tachycardia and decline in clinical condition. 1. Will change dialysate fluid to 4K/2.5 Ca.   2. Will try to UF 80ml/hr as BP tolerates 3. Need to have better  rate control prior to transitioning to intermittent HD but hopefully in the next 24 hours. 2. 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 ID following and TTE ordered and was negative for vegetation and normal EF and valvular function. On Daptomycin per ID.  Other workup of infected toe underway and ortho following.  Will need TEE to r/o perivalvular abscess. 3. Tachycardia/A fib with RVR despite amiodarone drip.  Agree with Cardiology consult. 4. Abnormal LFT's- ?shock liver or direct involvement of infection. Liver US negative.  5. Anemia of acute illness- follow and transfuse prn 6. Vascular access- RIJ tdc placed 09/07/17 7. Rheumatoid arthritis 8. Left wrist pain- was for IR for aspiration for cellcount/diff and crystal analysis. 9. Thalassemia 10. Hypoalbuminemia- due to sepsis 11. Disposition- hopefully will remain stable and can transition to IHD if no improvement in UOP.   Jessica Potts, MD [employer] [phone]

## 2017-09-10 NOTE — Progress Notes (Signed)
Patient out of the unit and no family available.  Patent need to be consented for Irrigation and debridement for the right great toe and left wrist.  She may also need the right wrist done but this will need to be reevaluated in the AM by Dr Mardelle Matte

## 2017-09-10 NOTE — Progress Notes (Signed)
Subjective: Pt having auditory hallucinations.   Antibiotics:  Anti-infectives (From admission, onward)   Start     Dose/Rate Route Frequency Ordered Stop   09/08/17 1600  DAPTOmycin (CUBICIN) 1,000 mg in sodium chloride 0.9 % IVPB     1,000 mg 240 mL/hr over 30 Minutes Intravenous Every 48 hours 09/07/17 1019     09/07/17 1000  vancomycin (VANCOCIN) 1,500 mg in sodium chloride 0.9 % 500 mL IVPB  Status:  Discontinued     1,500 mg 250 mL/hr over 120 Minutes Intravenous Every 48 hours 09/06/2017 0906 09/06/17 0935   09/06/17 1600  DAPTOmycin (CUBICIN) 984 mg in sodium chloride 0.9 % IVPB  Status:  Discontinued     8 mg/kg  123 kg 239.4 mL/hr over 30 Minutes Intravenous Every 48 hours 09/06/17 1436 09/07/17 1019   09/06/17 1000  aztreonam (AZACTAM) 0.5 g in dextrose 5 % 50 mL IVPB  Status:  Discontinued     0.5 g 100 mL/hr over 30 Minutes Intravenous Every 8 hours 09/06/17 0943 09/06/17 1223   08/27/2017 2000  metroNIDAZOLE (FLAGYL) IVPB 500 mg  Status:  Discontinued     500 mg 100 mL/hr over 60 Minutes Intravenous Every 8 hours 09/06/2017 1343 09/06/17 0028   08/09/2017 0900  aztreonam (AZACTAM) 2 g in sodium chloride 0.9 % 100 mL IVPB  Status:  Discontinued     2 g 200 mL/hr over 30 Minutes Intravenous Every 8 hours 09/04/2017 0853 09/06/17 0028   08/30/2017 0900  metroNIDAZOLE (FLAGYL) IVPB 500 mg     500 mg 100 mL/hr over 60 Minutes Intravenous  Once 08/16/2017 0853 08/09/2017 1035   08/14/2017 0830  piperacillin-tazobactam (ZOSYN) IVPB 3.375 g  Status:  Discontinued     3.375 g 100 mL/hr over 30 Minutes Intravenous  Once 08/20/2017 0816 08/25/2017 0836   09/02/2017 0830  vancomycin (VANCOCIN) IVPB 1000 mg/200 mL premix  Status:  Discontinued     1,000 mg 200 mL/hr over 60 Minutes Intravenous  Once 08/17/2017 0816 08/27/2017 0822   09/07/2017 0830  vancomycin (VANCOCIN) 2,000 mg in sodium chloride 0.9 % 500 mL IVPB     2,000 mg 250 mL/hr over 120 Minutes Intravenous  Once 09/02/2017 1610  08/28/2017 1342      Medications: Scheduled Meds: . Chlorhexidine Gluconate Cloth  6 each Topical Daily  . insulin aspart  0-15 Units Subcutaneous TID WC  . lactulose  10 g Oral Daily  . mouth rinse  15 mL Mouth Rinse BID  . metoprolol tartrate  10 mg Intravenous Q6H  . sodium chloride flush  3 mL Intravenous Q12H   Continuous Infusions: . sodium chloride Stopped (09/09/17 1800)  . amiodarone 30 mg/hr (09/10/17 0900)  . DAPTOmycin (CUBICIN)  IV Stopped (09/08/17 1723)  . dextrose 5 % and 0.9% NaCl 30 mL/hr at 09/09/17 2141  . dialysate (PRISMASATE) 1,500 mL/hr at 09/10/17 0729  . dialysis replacement fluid (prismasate) 500 mL/hr at 09/10/17 0747  . dialysis replacement fluid (prismasate) 300 mL/hr at 09/09/17 2148   PRN Meds:.acetaminophen **OR** acetaminophen, fentaNYL (SUBLIMAZE) injection, ondansetron **OR** ondansetron (ZOFRAN) IV    Objective: Weight change: 10.6 oz (0.3 kg)  Intake/Output Summary (Last 24 hours) at 09/10/2017 1028 Last data filed at 09/10/2017 1000 Gross per 24 hour  Intake 1240.4 ml  Output 1147 ml  Net 93.4 ml   Blood pressure 130/89, pulse (!) 136, temperature 97.6 F (36.4 C), temperature source Oral, resp. rate (!) 28, height 5\' 2"  (  1.575 m), weight 278 lb 7.1 oz (126.3 kg), last menstrual period 12/27/2013, SpO2 100 %. Temp:  [97 F (36.1 C)-98.5 F (36.9 C)] 97.6 F (36.4 C) (04/03 0727) Pulse Rate:  [127-144] 136 (04/03 0900) Resp:  [15-29] 28 (04/03 0900) BP: (117-162)/(63-114) 130/89 (04/03 0900) SpO2:  [93 %-100 %] 100 % (04/03 0900) Weight:  [278 lb 7.1 oz (126.3 kg)] 278 lb 7.1 oz (126.3 kg) (04/03 0600)  Physical Exam: General: arousable but confused Getting CVVHD folllowing commands recognizes family and objects HEENT: anicteric sclera, pupils reactive to light and accommodation, EOMI CVS regular rate, normal r,  no murmur rubs or gallops Chest: clear to auscultation bilaterally, no wheezing, rales or rhonchi Abdomen: soft  nontender, nondistended, normal bowel sounds, Extremities/skin:  Skin possible Osler's node 09/08/17. Not changed todays exam 09/09/17    Splinter hemmorhages 09/08/17      Hyperpigmented lesions: ? Janeway's chronicity uncertain     Toe swollen and tender 09/08/17:       Neuro: nonfocal  CBC:    BMET Recent Labs    09/09/17 1600 09/10/17 0436  NA 136 135  K 3.7 3.9  CL 100* 98*  CO2 22 22  GLUCOSE 144* 187*  BUN 31* 24*  CREATININE 2.47* 2.31*  CALCIUM 7.9* 8.4*     Liver Panel  Recent Labs    09/09/17 0432 09/09/17 1004 09/09/17 1600 09/10/17 0436  PROT 6.9 6.4*  --   --   ALBUMIN 1.5*  1.5* 1.5* 1.4* 1.5*  AST 102* 98*  --   --   ALT 30 30  --   --   ALKPHOS 526* 541*  --   --   BILITOT 5.4* 5.4*  --   --   BILIDIR 3.7* 3.7*  --   --   IBILI 1.7* 1.7*  --   --        Sedimentation Rate No results for input(s): ESRSEDRATE in the last 72 hours. C-Reactive Protein No results for input(s): CRP in the last 72 hours.  Micro Results: Recent Results (from the past 720 hour(s))  Blood Culture (routine x 2)     Status: Abnormal   Collection Time: 08/31/2017  9:50 AM  Result Value Ref Range Status   Specimen Description BLOOD RIGHT HAND  Final   Special Requests   Final    BOTTLES DRAWN AEROBIC AND ANAEROBIC Blood Culture adequate volume   Culture  Setup Time   Final    GRAM POSITIVE COCCI IN BOTH AEROBIC AND ANAEROBIC BOTTLES CRITICAL RESULT CALLED TO, READ BACK BY AND VERIFIED WITH: G ABBOTT 09/06/17 0006 JDW    Culture (A)  Final    STAPHYLOCOCCUS AUREUS SUSCEPTIBILITIES PERFORMED ON PREVIOUS CULTURE WITHIN THE LAST 5 DAYS. Performed at [facility], [place] [address], [place], [place] [address]    Report Status 09/08/2017 FINAL  Final  Blood Culture (routine x 2)     Status: Abnormal   Collection Time: 09/04/2017  1:54 PM  Result Value Ref Range Status   Specimen Description BLOOD RIGHT FOREARM  Final   Special Requests   Final     BOTTLES DRAWN AEROBIC AND ANAEROBIC Blood Culture adequate volume   Culture  Setup Time   Final    GRAM POSITIVE COCCI IN BOTH AEROBIC AND ANAEROBIC BOTTLES CRITICAL RESULT CALLED TO, READ BACK BY AND VERIFIED WITH: Denton Brick PHARMD 09/06/17 0006 JDW Performed at [facility], [place] [address], [place], [place] [address]  Culture STAPHYLOCOCCUS AUREUS (A)  Final   Report Status 09/08/2017 FINAL  Final   Organism ID, Bacteria STAPHYLOCOCCUS AUREUS  Final      Susceptibility   Staphylococcus aureus - MIC*    CIPROFLOXACIN <=0.5 SENSITIVE Sensitive     ERYTHROMYCIN <=0.25 SENSITIVE Sensitive     GENTAMICIN <=0.5 SENSITIVE Sensitive     OXACILLIN 0.5 SENSITIVE Sensitive     TETRACYCLINE <=1 SENSITIVE Sensitive     VANCOMYCIN 1 SENSITIVE Sensitive     TRIMETH/SULFA <=10 SENSITIVE Sensitive     CLINDAMYCIN <=0.25 SENSITIVE Sensitive     RIFAMPIN <=0.5 SENSITIVE Sensitive     Inducible Clindamycin NEGATIVE Sensitive     * STAPHYLOCOCCUS AUREUS  Blood Culture ID Panel (Reflexed)     Status: Abnormal   Collection Time: 08/24/2017  1:54 PM  Result Value Ref Range Status   Enterococcus species NOT DETECTED NOT DETECTED Final   Listeria monocytogenes NOT DETECTED NOT DETECTED Final   Staphylococcus species DETECTED (A) NOT DETECTED Final    Comment: CRITICAL RESULT CALLED TO, READ BACK BY AND VERIFIED WITH: G ABBOTT PHARMD 09/06/17 0006 JDW    Staphylococcus aureus DETECTED (A) NOT DETECTED Final    Comment: Methicillin (oxacillin) susceptible Staphylococcus aureus (MSSA). Preferred therapy is anti staphylococcal beta lactam antibiotic (Cefazolin or Nafcillin), unless clinically contraindicated. CRITICAL RESULT CALLED TO, READ BACK BY AND VERIFIED WITH: G ABBOTT PHARMD 09/06/17 0006 JDW    Methicillin resistance NOT DETECTED NOT DETECTED Final   Streptococcus species NOT DETECTED NOT DETECTED Final   Streptococcus agalactiae NOT DETECTED NOT DETECTED Final   Streptococcus pneumoniae NOT  DETECTED NOT DETECTED Final   Streptococcus pyogenes NOT DETECTED NOT DETECTED Final   Acinetobacter baumannii NOT DETECTED NOT DETECTED Final   Enterobacteriaceae species NOT DETECTED NOT DETECTED Final   Enterobacter cloacae complex NOT DETECTED NOT DETECTED Final   Escherichia coli NOT DETECTED NOT DETECTED Final   Klebsiella oxytoca NOT DETECTED NOT DETECTED Final   Klebsiella pneumoniae NOT DETECTED NOT DETECTED Final   Proteus species NOT DETECTED NOT DETECTED Final   Serratia marcescens NOT DETECTED NOT DETECTED Final   Haemophilus influenzae NOT DETECTED NOT DETECTED Final   Neisseria meningitidis NOT DETECTED NOT DETECTED Final   Pseudomonas aeruginosa NOT DETECTED NOT DETECTED Final   Candida albicans NOT DETECTED NOT DETECTED Final   Candida glabrata NOT DETECTED NOT DETECTED Final   Candida krusei NOT DETECTED NOT DETECTED Final   Candida parapsilosis NOT DETECTED NOT DETECTED Final   Candida tropicalis NOT DETECTED NOT DETECTED Final    Comment: Performed at [facility], [place] [address], [place], [place] [address]  Urine culture     Status: Abnormal   Collection Time: 08/15/2017  3:56 PM  Result Value Ref Range Status   Specimen Description URINE, RANDOM  Final   Special Requests   Final    NONE Performed at [facility], [place] [address], [place], [place] [address]    Culture 50,000 COLONIES/mL STAPHYLOCOCCUS AUREUS (A)  Final   Report Status 09/07/2017 FINAL  Final   Organism ID, Bacteria STAPHYLOCOCCUS AUREUS (A)  Final      Susceptibility   Staphylococcus aureus - MIC*    CIPROFLOXACIN <=0.5 SENSITIVE Sensitive     GENTAMICIN <=0.5 SENSITIVE Sensitive     NITROFURANTOIN 32 SENSITIVE Sensitive     OXACILLIN 0.5 SENSITIVE Sensitive     TETRACYCLINE <=1 SENSITIVE Sensitive     VANCOMYCIN 1 SENSITIVE Sensitive  TRIMETH/SULFA <=10 SENSITIVE Sensitive     CLINDAMYCIN <=0.25 SENSITIVE Sensitive     RIFAMPIN <=0.5 SENSITIVE Sensitive     Inducible  Clindamycin NEGATIVE Sensitive     * 50,000 COLONIES/mL STAPHYLOCOCCUS AUREUS  MRSA PCR Screening     Status: None   Collection Time: 09/06/17  5:20 AM  Result Value Ref Range Status   MRSA by PCR NEGATIVE NEGATIVE Final    Comment:        The GeneXpert MRSA Assay (FDA approved for NASAL specimens only), is one component of a comprehensive MRSA colonization surveillance program. It is not intended to diagnose MRSA infection nor to guide or monitor treatment for MRSA infections. Performed at [facility], [place] [address], [place], [place] [address]   Culture, blood (routine x 2)     Status: Abnormal   Collection Time: 09/07/17  7:09 AM  Result Value Ref Range Status   Specimen Description BLOOD RIGHT HAND  Final   Special Requests AEROBIC BOTTLE ONLY Blood Culture adequate volume  Final   Culture  Setup Time   Final    GRAM POSITIVE COCCI AEROBIC BOTTLE ONLY CRITICAL VALUE NOTED.  VALUE IS CONSISTENT WITH PREVIOUSLY REPORTED AND CALLED VALUE.    Culture (A)  Final    STAPHYLOCOCCUS AUREUS SUSCEPTIBILITIES PERFORMED ON PREVIOUS CULTURE WITHIN THE LAST 5 DAYS. Performed at [facility], [place] [address], [place], [place] [address]    Report Status 09/09/2017 FINAL  Final  Culture, blood (routine x 2)     Status: None (Preliminary result)   Collection Time: 09/07/17  7:17 AM  Result Value Ref Range Status   Specimen Description BLOOD LEFT ANTECUBITAL  Final   Special Requests   Final    IN BOTH AEROBIC AND ANAEROBIC BOTTLES Blood Culture results may not be optimal due to an excessive volume of blood received in culture bottles   Culture   Final    NO GROWTH 2 DAYS Performed at [facility], [place] [address] [place], [place] [address]    Report Status PENDING  Incomplete  Aerobic Culture (superficial specimen)     Status: None (Preliminary result)   Collection Time: 09/08/17  3:10 PM  Result Value Ref Range Status   Specimen Description WOUND RIGHT TOE  Final    Special Requests   Final    NONE Performed at [facility], [place] [address], [place], [place] [address]    Gram Stain   Final    RARE WBC PRESENT, PREDOMINANTLY MONONUCLEAR FEW GRAM POSITIVE COCCI ABUNDANT SQUAMOUS EPITHELIAL CELLS PRESENT    Culture ABUNDANT STAPHYLOCOCCUS AUREUS  Final   Report Status PENDING  Incomplete  Body fluid culture     Status: None (Preliminary result)   Collection Time: 09/09/17  3:00 PM  Result Value Ref Range Status   Specimen Description SYNOVIAL LEFT WRIST  Final   Special Requests NONE  Final   Gram Stain   Final    ABUNDANT WBC PRESENT, PREDOMINANTLY PMN MODERATE GRAM POSITIVE COCCI    Culture   Final    TOO YOUNG TO READ Performed at [facility], [place] [address], [place],  [address]    Report Status PENDING  Incomplete    Studies/Results: Ct Abdomen Pelvis Wo Contrast  Result Date: 09/09/2017 CLINICAL DATA:  Bacteremia EXAM: CT ABDOMEN AND PELVIS WITHOUT CONTRAST TECHNIQUE: Multidetector CT imaging of the abdomen and pelvis was performed following the standard protocol without IV contrast.  COMPARISON:  None. FINDINGS: Motion degraded images. Lower chest: Small right pleural effusion. Hepatobiliary: Liver is grossly unremarkable. Gallbladder is grossly unremarkable. No intrahepatic or extrahepatic ductal dilatation. Pancreas: Within normal limits. Spleen: Within normal limits. Adrenals/Urinary Tract: Adrenal glands within normal limits. Kidneys are within normal limits. No renal calculi or hydronephrosis. Bladder is decompressed by an indwelling Foley catheter. Stomach/Bowel: Stomach is within normal limits. No evidence of bowel obstruction. Normal appendix (series 9/image 55). Mild sigmoid diverticulosis, without evidence of diverticulitis. Vascular/Lymphatic: No evidence of abdominal aortic aneurysm. Atherosclerotic calcifications of the abdominal aorta and branch vessels. No suspicious abdominopelvic lymphadenopathy.  Reproductive: Uterus is within normal limits. Bilateral ovaries are within normal limits. Other: No abdominopelvic ascites. Musculoskeletal: Degenerative changes of the lower thoracic spine. IMPRESSION: No CT findings to account for the patient's bacteremia. Small right pleural effusion. Mild sigmoid diverticulosis, without evidence of diverticulitis. Bladder decompressed by indwelling Foley catheter. Electronically Signed   By: Julian Hy M.D.   On: 09/09/2017 15:26   Ct Head Wo Contrast  Result Date: 09/09/2017 CLINICAL DATA:  57 year old female with a history of altered mental status EXAM: CT HEAD WITHOUT CONTRAST TECHNIQUE: Contiguous axial images were obtained from the base of the skull through the vertex without intravenous contrast. COMPARISON:  None. FINDINGS: Brain: No acute intracranial hemorrhage. No midline shift or mass effect. Unremarkable appearance of the ventricles. Gray-white differentiation maintained. Vascular: Calcifications of the anterior circulation Skull: No acute displaced fracture. Sinuses/Orbits: Trace disease within the ethmoid air cells. Maxillary sinuses, frontal sinuses, and sphenoid sinuses patent Other: None IMPRESSION: CT negative for acute intracranial abnormality. Electronically Signed   By: Corrie Mckusick D.O.   On: 09/09/2017 15:23   Dg Chest Port 1 View  Result Date: 09/09/2017 CLINICAL DATA:  Altered mental status EXAM: PORTABLE CHEST 1 VIEW COMPARISON:  09/07/2017 FINDINGS: Right jugular dialysis catheter in the right atrium unchanged. Cardiac enlargement with vascular congestion unchanged. Negative for edema. Small right effusion. Mild right lower lobe airspace disease with mild progression, and may be atelectasis or infiltrate. IMPRESSION: Pulmonary vascular congestion unchanged Mild progression of right lower lobe airspace disease Electronically Signed   By: Franchot Gallo M.D.   On: 09/09/2017 11:35   Dg Fluoro Guided Needle Plc Aspiration/injection  Loc  Result Date: 09/09/2017 CLINICAL DATA:  Staph aureus bacteremia. Left wrist soft tissue swelling EXAM: Left WRIST  INJECTION UNDER FLUOROSCOPY FLUOROSCOPY TIME:  Fluoroscopy Time:  0 minutes 48 seconds Radiation Exposure Index (if provided by the fluoroscopic device): Number of Acquired Spot Images: 0 PROCEDURE: Diffuse soft tissue swelling around the wrist. Overlying skin prepped with Betadine, draped in the usual sterile fashion, and infiltrated locally with buffered Lidocaine. 21 gauge spinal needle advanced into the radiocarpal joint without difficulty. 0.5 mL of normal appearing joint fluid was aspirated and sent to the laboratory. Additional repositioning of the needle did not yield any further fluid. 1 mL of Isovue contrast injected into the needle confirming intra-articular location. IMPRESSION: Successful left wrist arthrocentesis. No significant effusion. 0.5 mL of normal appearing joint fluid sent to the laboratory for analysis. Electronically Signed   By: Franchot Gallo M.D.   On: 09/09/2017 15:08      Assessment/Plan:  INTERVAL HISTORY:   Pt still on CVVHD, having some hallucinations  Principal Problem:   Bacteremia due to methicillin susceptible Staphylococcus aureus (MSSA) Active Problems:   Thalassemia   Rheumatoid arthritis ([place])   Morbid obesity ([place])   Acute back pain   Essential hypertension   Diabetes (  [place])   Dyslipidemia   Sepsis ([place])   Acute kidney injury ([place])   Immunosuppression due to drug therapy   Rheumatoid arthritis(714.0)   Acute pain of left wrist   Great toe pain, right   Hypertension   Hyperlipidemia   Acute hearing loss of left ear   Diabetes mellitus, insulin dependent (IDDM), controlled ([place])   Beta thalassemia (H[facility])   Symptomatic anemia   Elevated liver enzymes   Hyperbilirubinemia    Jessica Howard is a 57 y.o. female with  RA  Admitted with MSSA B with septic shock, ARF, shock liver with evidence of endocarditis and "metastatic  infection: with likely left septic wrist, infected right toe, and possible Lumbar spine infection  #1 MSSAB with shock metastatic disease:       [facility] Staphylococcus aureus bacteremia   Staphylococcus aureus bacteremia (SAB) is associated with a high rate of complications and mortality.  Specific aspects of clinical management are critical to optimizing the outcome of patients with SAB.  Therefore, the [facility] Antimicrobial Management Team [facility]) has initiated an intervention aimed at improving the management of SAB at [facility].  To do so, Infectious Diseases physicians are providing an evidence-based consult for the management of all patients with SAB.     Yes No Comments  Perform follow-up blood cultures (even if the patient is afebrile) to ensure clearance of bacteremia [x]  []  09/07/17 cxs still positive, she will need repeat blood cultures AFTER HD line holiday  Remove vascular catheter and obtain follow-up blood cultures after the removal of the catheter []  []  HD line holiday when possible  Perform echocardiography to evaluate for endocarditis (transthoracic ECHO is 40-50% sensitive, TEE is > 90% sensitive) []  []  Please keep in mind, that neither test can definitively EXCLUDE endocarditis, and that should clinical suspicion remain high for endocarditis the patient should then still be treated with an "endocarditis" duration of therapy = 6 weeks  She is going to get treated for 6-8 weeks but we will want a TEE along the way as well if possible  Consult electrophysiologist to evaluate implanted cardiac device (pacemaker, ICD) []  []    Ensure source control []  []  Have all abscesses been drained effectively? Have deep seeded infections (septic joints or osteomyelitis) had appropriate surgical debridement?  I am worried that she needs more interventions specifically at wrist and possibly foot  Investigate for "metastatic" sites of infection []  []  Does  the patient have ANY symptom or physical exam finding that would suggest a deeper infection (back or neck pain that may be suggestive of vertebral osteomyelitis or epidural abscess, muscle pain that could be a symptom of pyomyositis)?  Keep in mind that for deep seeded infections MRI imaging with contrast is preferred rather than other often insensitive tests such as plain x-rays, especially early in a patient's presentation.  Greatly appreciate orthopedic consultation agree with IR aspirate of wrist, also would get MRI of left wrist WO gad, right toe and foot WO gad and L spine   Change antibiotic therapy to daptomycin []  []  Beta-lactam antibiotics are preferred for MSSA due to higher cure rates.   If on Vancomycin, goal trough should be 15 - 20 mcg/mL  Estimated duration of IV antibiotic therapy:  8 weeks []  []  Consult case management for probably prolonged outpatient IV antibiotic therapy   #2 Left wrist pain:  Fluid sent for culture but not cell count and crystal (latter was higher priority for me). I doubt  culture will grow anything ON abx. As above will get MRI when able   #3 Toe infection: Dr. Mardelle Matte believes foot infection confined to small toe soft tissue. When stable I would still get an MRI so we dont miss something here though  #4 LBP: needs MRI L spine when able  #5 Neuro: confusion: more likely due to multifactorial problems but could have septic emboli to CNS. She does not have focal deficits at this time.  Dr. Johnnye Sima will be covering for me the next 2 days.   LOS: 5 days   Alcide Evener 09/10/2017, 10:28 AM

## 2017-09-10 NOTE — Progress Notes (Addendum)
Patient ID: Jessica Howard, female   DOB: 11/15/1960, 57 y.o.   MRN: [id number]     Subjective:  Patient reports pain as mild to moderate.  Patient is alert and in bed responds to commands. Patient reports that she is noticing improvement and feeling better  Objective:   VITALS:   Vitals:   09/10/17 0930 09/10/17 1000 09/10/17 1030 09/10/17 1100  BP: (!) 147/102 (!) 154/129 (!) 144/90 (!) 130/99  Pulse:  (!) 137 (!) 138 (!) 140  Resp: (!) 28 (!) 24 (!) 26 (!) 33  Temp:      TempSrc:      SpO2:  99% 99% 100%  Weight:      Height:        ABD soft Sensation intact distally Dorsiflexion/Plantar flexion intact Right great toe and foot swollen toe with necrosis  Right wrist swollen with necrosis on the right index finger at the tip Left wrist swollen no area of necrosis  Patient is able to flex extend and abduct all fingers  Lab Results  Component Value Date   WBC 25.3 (H) 09/10/2017   HGB 9.2 (L) 09/10/2017   HCT 28.0 (L) 09/10/2017   MCV 65.4 (L) 09/10/2017   PLT 352 09/10/2017   BMET    Component Value Date/Time   NA 135 09/10/2017 0436   NA 142 03/06/2017 0929   K 3.9 09/10/2017 0436   K 4.1 03/06/2017 0929   CL 98 (L) 09/10/2017 0436   CL 101 02/13/2012 0850   CO2 22 09/10/2017 0436   CO2 23 03/06/2017 0929   GLUCOSE 187 (H) 09/10/2017 0436   GLUCOSE 129 03/06/2017 0929   GLUCOSE 529 (H) 02/13/2012 0850   BUN 24 (H) 09/10/2017 0436   BUN 17.1 03/06/2017 0929   CREATININE 2.31 (H) 09/10/2017 0436   CREATININE 0.8 03/06/2017 0929   CALCIUM 8.4 (L) 09/10/2017 0436   CALCIUM 9.6 03/06/2017 0929   GFRNONAA 23 (L) 09/10/2017 0436   GFRAA 26 (L) 09/10/2017 0436     Assessment/Plan:     Principal Problem:   Bacteremia due to methicillin susceptible Staphylococcus aureus (MSSA) Active Problems:   Thalassemia   Rheumatoid arthritis (HCC)   Morbid obesity (HCC)   Acute back pain   Essential hypertension   Diabetes (HCC)   Dyslipidemia   Sepsis  ([place])   Acute kidney injury ([place])   Immunosuppression due to drug therapy   Rheumatoid arthritis(714.0)   Acute pain of left wrist   Great toe pain, right   Hypertension   Hyperlipidemia   Acute hearing loss of left ear   Diabetes mellitus, insulin dependent (IDDM), controlled (HCC)   Beta thalassemia (HCC)   Symptomatic anemia   Elevated liver enzymes   Hyperbilirubinemia   Advance diet Up with therapy Continue plan per medicine Will continue to monitor      DOUGLAS PARRY, BRANDON 09/10/2017, 11:40 AM  Discussed and agree with above.  Sees to be improving clinically.    Wrist aspirate and toe both demonstrate gram positive cocci.  Consider surgical I&D of both tomorrow if she is clinically stable enough.  Will discuss with primary team.    [name], MD Cell [phone]

## 2017-09-10 NOTE — Progress Notes (Signed)
CRRT stopped to take patient to MRI

## 2017-09-10 NOTE — Progress Notes (Addendum)
ANTICOAGULATION CONSULT NOTE - Initial Consult  Pharmacy Consult for heparin Indication: atrial fibrillation  Allergies  Allergen Reactions  . Cefuroxime Axetil Other (See Comments)    Blistering wounds per family  . Cephalosporins Other (See Comments)    Blistering wounds as an adult per family  . Bactrim [Sulfamethoxazole-Trimethoprim] Hives  . Iohexol Itching and Rash     Code: RASH, Desc: HAD ITCHING AND A RASH ABOUT ONE HOUR AFTER RETURNING HOME FROM THE CT, Onset Date: [id number]   . Lisinopril Cough  . Penicillins Hives  . Sulfa Antibiotics Hives    Patient Measurements: Height: 5\' 2"  (157.5 cm) Weight: 278 lb 7.1 oz (126.3 kg) IBW/kg (Calculated) : 50.1 Heparin Dosing Weight: 81 kg  Vital Signs: Temp: 97.6 F (36.4 C) (04/03 0727) Temp Source: Oral (04/03 0727) BP: 130/89 (04/03 0900) Pulse Rate: 136 (04/03 0900)  Labs: Recent Labs    09/08/17 1325 09/09/17 0432 09/09/17 1004 09/09/17 1600 09/10/17 0434 09/10/17 0436  HGB 8.1* 8.6*  --   --  9.2*  --   HCT 25.7* 27.2*  --   --  28.0*  --   PLT 344 347  --   --  352  --   LABPROT  --   --  19.8*  --   --   --   INR  --   --  1.69  --   --   --   CREATININE  --  2.55*  --  2.Jessica*  --  2.31*  CKTOTAL  --   --   --   --  131  --     Estimated Creatinine Clearance: 35 mL/min (A) (by C-G formula based on SCr of 2.31 mg/dL (H)).   Medical History: Past Medical History:  Diagnosis Date  . Asthma   . Beta thalassemia w/chronic anemia    Thalassemia  . Diabetes mellitus type 2, insulin dependent (HCC)    type 2  . Diabetic neuropathy ([place])   . Endometrial polyp   . Fibroadenoma of breast    left  . GERD (gastroesophageal reflux disease)   . Headache    migraines  . Hearing loss, right   . Hepatic steatosis   . Hyperlipidemia   . Hypertension   . Immunocompromised state due to drug therapy   . Impingement syndrome of right shoulder   . Nontraumatic tear of right rotator cuff   . Pneumonia   .  Psoriasis   . Rheumatoid arthritis(714.0)    rhematoid and osteoarthritis  . Sleep apnea   . Thyroid disease    had radiation    Assessment: Jessica Howard found to have disseminated MSSA bacteremia now with rapid AFib to start on IV heparin. Of note, pt received enoxaparin SQ at 2050 last night. CBC improving and per RN hematuria has resolved and pt is not receiving heparin through CRRT circuit.  Goal of Therapy:  Heparin level 0.3-0.7 units/ml Monitor platelets by anticoagulation protocol: Yes   Plan:  -Start heparin 1200 units/hr -Check 8-hr heparin level -Monitor daily heparin level, CBC, S/Sx bleeding daily  ADDENDUM: Per surgery, stop heparin at midnight. Will F/U resuming post-op tomorrow.  Arrie Senate, PharmD, BCPS PGY-2 Cardiology Pharmacy Resident Pager: [phone] 09/10/2017

## 2017-09-10 NOTE — Consult Note (Signed)
Cardiology Consultation:   Patient ID: Jessica Howard; [id number]; 13-Feb-1961   Admit date: 08/17/2017 Date of Consult: 09/10/2017  Primary Care Provider: Merrilee Seashore, MD Primary Cardiologist: Jessica Breeding, MD    Patient Profile:   Jessica Howard is a 57 y.o. female with a hx of hypertension, diabetes type II on insulin, RA on immunosuppressives, thyroid disease status post radiation, sleep apnea, hyperlipidemia, GERD, beta thalassemia with chronic anemia, chronic back pain and diabetic neuropathy who is being seen today for the evaluation of atrial fibrillation with RVR at the request of Jessica Howard.  History of Present Illness:   Jessica Howard admitted to the hospital on 08/29/2017 with complaints of shaking, confusion, decreased to near absent urination with dark urine noted, significant left wrist and hand swelling with pain as well as significant pain in the right great toe with dusky discoloration.  She was found to have acute kidney injury with creatinine up to 3.36 with an associated anion gap acidosis, elevated liver markers, leukocytosis and anemia.  The patient was on immunosuppressive medications for rheumatoid arthritis and she was requiring Aranesp injections for her beta thalassemia anemia.  The patient is being treated for severe sepsis secondary to MSSA staph bacteremia with organ injury currently requiring CRRT.  She is being followed by infectious disease for sepsis, orthopedics for joint pain possible gout versus septic arthritis, and nephrology for acute renal failure.  The patient developed atrial fibrillation with rapid ventricular response with rates in the 130s on 4/1.  She was previously in sinus rhythm in the 70s.  Amiodarone drip was initiated, however her heart rate has remained elevated and she continues in atrial fibrillation in the 130s.  She is also receiving Lopressor 10 mg IV every 6 hours.  The patient has been seen in our office by Jessica Howard.  She  was evaluated for shortness of breath in 2015 with a normal stress perfusion study and there was no clear etiology of her dyspnea identified.  Her last office visit was on 01/12/16 for hospital follow-up of dyspnea.  Per Jessica Howard note, during her hospitalization her BNP was only mildly elevated, echo was unremarkable, cardiac enzymes were negative.  At the follow-up visit she had no acute symptoms but did note some shortness of breath with activity.  This was felt to be chronic with no new symptoms.  It was felt that she likely has some diastolic dysfunction and she was advised on sodium restriction, fluid restriction, good blood pressure control and treatment for sleep apnea.  No further workup was recommended.  Past Medical History:  Diagnosis Date  . Asthma   . Beta thalassemia w/chronic anemia    Thalassemia  . Diabetes mellitus type 2, insulin dependent (HCC)    type 2  . Diabetic neuropathy ([place])   . Endometrial polyp   . Fibroadenoma of breast    left  . GERD (gastroesophageal reflux disease)   . Headache    migraines  . Hearing loss, right   . Hepatic steatosis   . Hyperlipidemia   . Hypertension   . Immunocompromised state due to drug therapy   . Impingement syndrome of right shoulder   . Nontraumatic tear of right rotator cuff   . Pneumonia   . Psoriasis   . Rheumatoid arthritis(714.0)    rhematoid and osteoarthritis  . Sleep apnea   . Thyroid disease    had radiation    Past Surgical History:  Procedure Laterality Date  . ANKLE ARTHROSCOPY Right   .  CESAREAN SECTION    . COLONOSCOPY    . DILATION AND CURETTAGE OF UTERUS    . EYE SURGERY     surgery on retina  . HAND SURGERY     for arthritis  . left knee surgery nodule removal    . OTHER SURGICAL HISTORY     multiple nodule removal on neck, hands, left knee  . SHOULDER ARTHROSCOPY WITH ROTATOR CUFF REPAIR AND SUBACROMIAL DECOMPRESSION Right 05/20/2014   Procedure: RIGHT SHOULDER ARTHROSCOPY WITH  DEBRIDEMENT/DISTAL CLAVICLE EXCISION/ROTATOR CUFF REPAIR AND SUBACROMIAL DECOMPRESSION;  Surgeon: Lorn Junes, MD;  Location: [facility];  Service: Orthopedics;  Laterality: Right;  . TONSILLECTOMY    . TUBAL LIGATION     bilateral     Home Medications:  Prior to Admission medications   Medication Sig Start Date End Date Taking? Authorizing Provider  abatacept (ORENCIA) 250 MG injection Inject 1,000 mg into the vein every 28 (twenty-eight) days. Monthly Infusion   Yes [provider]  albuterol (PROVENTIL HFA;VENTOLIN HFA) 108 (90 Base) MCG/ACT inhaler Inhale 2 puffs into the lungs every 6 (six) hours as needed for wheezing or shortness of breath.   Yes [provider]  amitriptyline (ELAVIL) 25 MG tablet Take 25 mg by mouth at bedtime.    Yes [provider]  ARTIFICIAL TEAR OP Place 1 drop into both eyes as needed (for dry eyes).   Yes [provider]  BYSTOLIC 10 MG tablet Take 20 mg by mouth daily.  10/07/16  Yes [provider]  chlorthalidone (HYGROTON) 25 MG tablet Take 25 mg by mouth daily.   Yes [provider]  CRESTOR 20 MG tablet Take 20 mg by mouth daily.  03/14/14  Yes [provider]  diltiazem (CARDIZEM CD) 240 MG 24 hr capsule Take 240 mg by mouth daily.  02/03/14  Yes [provider]  fluticasone (FLONASE) 50 MCG/ACT nasal spray Place 1 spray into both nostrils daily as needed for allergies or rhinitis.   Yes [provider]  gabapentin (NEURONTIN) 300 MG capsule Take 600 mg by mouth 3 (three) times daily.    Yes [provider]  HYDROcodone-acetaminophen (NORCO/VICODIN) 5-325 MG tablet Take 1 tablet by mouth every 6 (six) hours as needed for moderate pain.   Yes [provider]  insulin aspart (NOVOLOG) 100 UNIT/ML injection Inject 50 Units into the skin 3 (three) times daily before meals. 55 base Plus sliding scale    Yes [provider]  insulin NPH (HUMULIN N,NOVOLIN N)  100 UNIT/ML injection Inject 75 Units into the skin 3 (three) times daily with meals. 75 units with breakfast; lunch 75 units; 75 units at dinner   Yes [provider]  irbesartan (AVAPRO) 300 MG tablet Take 300 mg by mouth daily.    Yes [provider]  ISOtretinoin (ACCUTANE) 40 MG capsule Take 40 mg by mouth daily.   Yes [provider]  leflunomide (ARAVA) 20 MG tablet Take 20 mg by mouth daily.   Yes [provider]  magnesium oxide (MAG-OX) 400 MG tablet Take 400 mg by mouth daily.   Yes [provider]  meclizine (ANTIVERT) 25 MG tablet Take 25 mg by mouth 3 (three) times daily as needed for dizziness.   Yes [provider]  metFORMIN (GLUCOPHAGE-XR) 500 MG 24 hr tablet Take 500 mg by mouth every evening.   Yes [provider]  pantoprazole (PROTONIX) 40 MG tablet Take 40 mg by mouth daily.   Yes  [provider]  spironolactone (ALDACTONE) 25 MG tablet Take 25 mg by mouth daily. 08/27/17  Yes [provider]  topiramate (TOPAMAX) 100 MG tablet Take 100 mg by mouth at bedtime.  07/20/16  Yes [provider]  TRULICITY 9.02 IO/9.7DZ SOPN Inject 0.75 mg into the skin once a week. Patient takes on Wednesday. 08/29/15  Yes [provider]  Vitamin D, Ergocalciferol, (DRISDOL) 50000 units CAPS capsule Take 50,000 Units by mouth every 7 (seven) days.   Yes [provider]  VOLTAREN 1 % GEL Apply 1 application topically 3 (three) times daily as needed (for pain).  02/03/14  Yes [provider]  lidocaine (LIDODERM) 5 % Place 1 patch onto the skin daily. Remove & Discard patch within 12 hours or as directed by MD Patient not taking: Reported on 08/08/2017 08/27/17   Petrucelli, Glynda Jaeger, PA-C  methocarbamol (ROBAXIN) 500 MG tablet Take 2 tablets (1,000 mg total) by mouth 4 (four) times daily as needed (Pain). Patient not taking: Reported on 08/18/2017 09/04/17   Pisciotta, Elmyra Ricks, PA-C    naproxen (NAPROSYN) 500 MG tablet Take 1 tablet (500 mg total) by mouth 2 (two) times daily. Patient not taking: Reported on 08/15/2017 08/27/17   Amaryllis Dyke, PA-C    Inpatient Medications: Scheduled Meds: . Chlorhexidine Gluconate Cloth  6 each Topical Daily  . insulin aspart  0-15 Units Subcutaneous TID WC  . lactulose  10 g Oral Daily  . mouth rinse  15 mL Mouth Rinse BID  . metoprolol tartrate  10 mg Intravenous Q6H  . sodium chloride flush  3 mL Intravenous Q12H   Continuous Infusions: . sodium chloride Stopped (09/09/17 1800)  . amiodarone 30 mg/hr (09/10/17 1200)  . DAPTOmycin (CUBICIN)  IV Stopped (09/08/17 1723)  . dextrose 5 % and 0.9% NaCl 30 mL/hr at 09/10/17 1245  . heparin 1,200 Units/hr (09/10/17 1226)  . dialysis replacement fluid (prismasate) 500 mL/hr at 09/10/17 0747  . dialysis replacement fluid (prismasate) 300 mL/hr at 09/09/17 2148  . dialysate (PRISMASATE)     PRN Meds: acetaminophen **OR** acetaminophen, fentaNYL (SUBLIMAZE) injection, ondansetron **OR** ondansetron (ZOFRAN) IV  Allergies:    Allergies  Allergen Reactions  . Cefuroxime Axetil Other (See Comments)    Blistering wounds per family  . Cephalosporins Other (See Comments)    Blistering wounds as an adult per family  . Bactrim [Sulfamethoxazole-Trimethoprim] Hives  . Iohexol Itching and Rash     Code: RASH, Desc: HAD ITCHING AND A RASH ABOUT ONE HOUR AFTER RETURNING HOME FROM THE CT, Onset Date: [id number]   . Lisinopril Cough  . Penicillins Hives  . Sulfa Antibiotics Hives    Social History:   Social History   Socioeconomic History  . Marital status: Divorced    Spouse name: Not on file  . Number of children: Not on file  . Years of education: Not on file  . Highest education level: Not on file  Occupational History  . Not on file  Social Needs  . Financial resource strain: Patient refused  . Food insecurity:    Worry: Patient refused    Inability: Patient refused   . Transportation needs:    Medical: Patient refused    Non-medical: Patient refused  Tobacco Use  . Smoking status: Former Smoker    Packs/day: 0.25    Years: 6.00    Pack years: 1.50    Types: Cigarettes    Last attempt to quit: 06/09/2013    Years since  quitting: 4.2  . Smokeless tobacco: Never Used  Substance and Sexual Activity  . Alcohol use: No  . Drug use: No  . Sexual activity: Yes    Birth control/protection: Surgical  Lifestyle  . Physical activity:    Days per week: Patient refused    Minutes per session: Patient refused  . Stress: Patient refused  Relationships  . Social connections:    Talks on phone: Patient refused    Gets together: Patient refused    Attends religious service: Patient refused    Active member of club or organization: Patient refused    Attends meetings of clubs or organizations: Patient refused    Relationship status: Patient refused  . Intimate partner violence:    Fear of current or ex partner: Patient refused    Emotionally abused: Patient refused    Physically abused: Patient refused    Forced sexual activity: Patient refused  Other Topics Concern  . Not on file  Social History Narrative  . Not on file    Family History:    Family History  Problem Relation Age of Onset  . Diabetes Mother   . Cancer Mother        breast  . Aneurysm Father        brain  . Diabetes Sister   . Cancer Brother        bone marrow cancer     ROS:  Please see the history of present illness.     Physical Exam/Data:   Vitals:   09/10/17 1100 09/10/17 1130 09/10/17 1200 09/10/17 1230  BP: (!) 130/99 (!) 142/101 (!) 151/106 (!) 132/99  Pulse: (!) 140 (!) 117 (!) 132 (!) 138  Resp: (!) 33 (!) 28 17 (!) 25  Temp:      TempSrc:      SpO2: 100% 99% 99% 99%  Weight:      Height:        Intake/Output Summary (Last 24 hours) at 09/10/2017 1429 Last data filed at 09/10/2017 1200 Gross per 24 hour  Intake 1273.7 ml  Output 1214 ml  Net 59.7 ml    Filed Weights   09/08/17 0500 09/09/17 0449 09/10/17 0600  Weight: 271 lb 2.7 oz (123 kg) 277 lb 12.5 oz (126 kg) 278 lb 7.1 oz (126.3 kg)   Body mass index is 50.93 kg/m.   EKG:  The EKG was personally reviewed and demonstrates:  Atrial fibrillation and then atrial flutter with RVR Telemetry:  Telemetry was personally reviewed and demonstrates: Atrial fibrillation in the 130s  Relevant CV Studies:  Echocardiogram 09/07/17 Study Conclusions - Left ventricle: The cavity size was normal. There was moderate   concentric hypertrophy. Systolic function was vigorous. The   estimated ejection fraction was in the range of 65% to 70%. Wall   motion was normal; there were no regional wall motion   abnormalities. - Left atrium: The atrium was mildly dilated. - Tricuspid valve: There was moderate regurgitation. - Pulmonary arteries: Systolic pressure was moderately increased.   PA peak pressure: 51 mm Hg (S).   Laboratory Data:  Chemistry Recent Labs  Lab 09/09/17 0432 09/09/17 1600 09/10/17 0436  NA 136 136 135  K 4.0 3.7 3.9  CL 99* 100* 98*  CO2 22 22 22   GLUCOSE 150* 144* 187*  BUN 32* 31* 24*  CREATININE 2.55* 2.47* 2.31*  CALCIUM 8.1* 7.9* 8.4*  GFRNONAA 20* 21* 23*  GFRAA 23* 24* 26*  ANIONGAP 15 14 15  Recent Labs  Lab 09/06/17 2349  09/09/17 0432 09/09/17 1004 09/09/17 1600 09/10/17 0436  PROT 6.2*  --  6.9 6.4*  --   --   ALBUMIN 1.7*   < > 1.5*  1.5* 1.5* 1.4* 1.5*  AST 114*  --  102* 98*  --   --   ALT 34  --  30 30  --   --   ALKPHOS 382*  --  526* 541*  --   --   BILITOT 5.4*  --  5.4* 5.4*  --   --    < > = values in this interval not displayed.   Hematology Recent Labs  Lab 09/08/17 1325 09/09/17 0432 09/10/17 0434  WBC 24.1* 23.6* 25.3*  RBC 3.86* 4.13 4.28  HGB 8.1* 8.6* 9.2*  HCT 25.7* 27.2* 28.0*  MCV 66.6* 65.9* 65.4*  MCH 21.0* 20.8* 21.5*  MCHC 31.5 31.6 32.9  RDW 22.8* 23.0* 23.4*  PLT 344 347 352   Cardiac EnzymesNo  results for input(s): TROPONINI in the last 168 hours. No results for input(s): TROPIPOC in the last 168 hours.  BNPNo results for input(s): BNP, PROBNP in the last 168 hours.  DDimer No results for input(s): DDIMER in the last 168 hours.  Radiology/Studies:  Ct Abdomen Pelvis Wo Contrast  Result Date: 09/09/2017 CLINICAL DATA:  Bacteremia EXAM: CT ABDOMEN AND PELVIS WITHOUT CONTRAST TECHNIQUE: Multidetector CT imaging of the abdomen and pelvis was performed following the standard protocol without IV contrast. COMPARISON:  None. FINDINGS: Motion degraded images. Lower chest: Small right pleural effusion. Hepatobiliary: Liver is grossly unremarkable. Gallbladder is grossly unremarkable. No intrahepatic or extrahepatic ductal dilatation. Pancreas: Within normal limits. Spleen: Within normal limits. Adrenals/Urinary Tract: Adrenal glands within normal limits. Kidneys are within normal limits. No renal calculi or hydronephrosis. Bladder is decompressed by an indwelling Foley catheter. Stomach/Bowel: Stomach is within normal limits. No evidence of bowel obstruction. Normal appendix (series 9/image 55). Mild sigmoid diverticulosis, without evidence of diverticulitis. Vascular/Lymphatic: No evidence of abdominal aortic aneurysm. Atherosclerotic calcifications of the abdominal aorta and branch vessels. No suspicious abdominopelvic lymphadenopathy. Reproductive: Uterus is within normal limits. Bilateral ovaries are within normal limits. Other: No abdominopelvic ascites. Musculoskeletal: Degenerative changes of the lower thoracic spine. IMPRESSION: No CT findings to account for the patient's bacteremia. Small right pleural effusion. Mild sigmoid diverticulosis, without evidence of diverticulitis. Bladder decompressed by indwelling Foley catheter. Electronically Signed   By: Julian Hy M.D.   On: 09/09/2017 15:26   Ct Head Wo Contrast  Result Date: 09/09/2017 CLINICAL DATA:  56 year old female with a history of  altered mental status EXAM: CT HEAD WITHOUT CONTRAST TECHNIQUE: Contiguous axial images were obtained from the base of the skull through the vertex without intravenous contrast. COMPARISON:  None. FINDINGS: Brain: No acute intracranial hemorrhage. No midline shift or mass effect. Unremarkable appearance of the ventricles. Gray-white differentiation maintained. Vascular: Calcifications of the anterior circulation Skull: No acute displaced fracture. Sinuses/Orbits: Trace disease within the ethmoid air cells. Maxillary sinuses, frontal sinuses, and sphenoid sinuses patent Other: None IMPRESSION: CT negative for acute intracranial abnormality. Electronically Signed   By: Corrie Mckusick D.O.   On: 09/09/2017 15:23   Dg Chest Port 1 View  Result Date: 09/09/2017 CLINICAL DATA:  Altered mental status EXAM: PORTABLE CHEST 1 VIEW COMPARISON:  09/07/2017 FINDINGS: Right jugular dialysis catheter in the right atrium unchanged. Cardiac enlargement with vascular congestion unchanged. Negative for edema. Small right effusion. Mild right lower lobe airspace disease with mild progression,  and may be atelectasis or infiltrate. IMPRESSION: Pulmonary vascular congestion unchanged Mild progression of right lower lobe airspace disease Electronically Signed   By: Franchot Gallo M.D.   On: 09/09/2017 11:35   Dg Chest Port 1 View  Result Date: 09/07/2017 CLINICAL DATA:  Central line placement. EXAM: PORTABLE CHEST 1 VIEW COMPARISON:  08/21/2017 and prior radiographs FINDINGS: This is a mildly low volume film. A RIGHT IJ central venous catheter is noted with tip overlying the UPPER RIGHT atrium. There is no evidence of pneumothorax. Cardiomegaly and pulmonary vascular congestion again noted. IMPRESSION: RIGHT IJ central venous catheter with tip overlying the UPPER RIGHT atrium, in this low volume film. No pneumothorax. Cardiomegaly and pulmonary vascular congestion. Electronically Signed   By: Margarette Canada M.D.   On: 09/07/2017 14:34     Dg Fluoro Guided Needle Plc Aspiration/injection Loc  Result Date: 09/09/2017 CLINICAL DATA:  Staph aureus bacteremia. Left wrist soft tissue swelling EXAM: Left WRIST  INJECTION UNDER FLUOROSCOPY FLUOROSCOPY TIME:  Fluoroscopy Time:  0 minutes 48 seconds Radiation Exposure Index (if provided by the fluoroscopic device): Number of Acquired Spot Images: 0 PROCEDURE: Diffuse soft tissue swelling around the wrist. Overlying skin prepped with Betadine, draped in the usual sterile fashion, and infiltrated locally with buffered Lidocaine. 21 gauge spinal needle advanced into the radiocarpal joint without difficulty. 0.5 mL of normal appearing joint fluid was aspirated and sent to the laboratory. Additional repositioning of the needle did not yield any further fluid. 1 mL of Isovue contrast injected into the needle confirming intra-articular location. IMPRESSION: Successful left wrist arthrocentesis. No significant effusion. 0.5 mL of normal appearing joint fluid sent to the laboratory for analysis. Electronically Signed   By: Franchot Gallo M.D.   On: 09/09/2017 15:08    Assessment and Plan:   Atrial fibrillation/flutter with rapid ventricular response -Patient admitted with severe sepsis with acute renal failure and shock liver. I can find no documentation of any previous afib. This is likely related to her acute illness.  -Patient was initially in sinus rhythm, however developed atrial fibrillation with RVR on 4/1.  -Echo showed normal LV systolic function with mildly dilated left atrium -Amiodarone IV load was initiated on 4/1 along with Lopressor 10 mg IV every 6 hours.  Patient continues to be in atrial fib with rates in the 130s.  -Liver enzymes were initially elevated, now improving.  -Blood pressures were somewhat low on 4/2, however today her blood pressure is elevated 140s/90s.  -Could try diltiazem and monitor blood pressure closely. -CHA2DS2/VAS Stroke Risk Score is at least 4  (chf, HTN, DM,  female). She is currently anticoagulation with heparin.     MSSA septicemia -With presumed endocarditis with metastatic infection seeding joints/spine.  -Infectious disease following -Patient with possible Osler's node and splinter hemorrhages, impressive pictures in infectious disease notes -Echocardiogram was negative for vegetation, normal EF and valve function.  Will need TEE to rule out perivalvular abscess.  There is plan to take the patient to surgery tomorrow by orthopedics to washout her septic joints.  She will be intubated for this and her primary MD would like [place] to try to arrange TEE during this time of intubation. Per [facility] Sender ([facility]master) anesthesia should be able to perform TEE in the OR, as they do intraoperative TEE's with CABG surgery. Will discuss with Dr. Debara Pickett.  Acute on chronic diastolic heart failure -Echo showed normal LV systolic function with moderately increased pulmonary artery systolic pressure. PA peak pressure: 51 mm Hg (S). -  Chest x-ray with continued pulmonary vascular congestion -BNP was 163.2 -Fluid management per dialysis  Septic joints -Left wrist and right great toe.  Ortho plans to take her to surgery tomorrow to washout her septic joints  Acute renal failure -Management per nephrology with CVVHD.  Presumably due to ischemic ATN in setting of sepsis and hypotension. Considering CT without contrast to rule out renal infarcts.  -Serum creatinine up to 6.65, trending down with treatment, 2.31 today  Hypertension -Home therapy includes Bystolic 20 mg daily chlorthalidone 25 mg daily, diltiazem 240 mg daily irbesartan 300 mg daily, spironolactone 25 mg daily.  All medications currently on hold -Blood pressure was initially low but has been improving.  She is on amiodarone and IV metoprolol 10 mg every 6 hours.  She may be able to tolerate the addition of diltiazem for heart rate control.  For questions or updates, please contact [place] Please consult  [email or web address] for contact info under Cardiology/STEMI.   Signed, Daune Perch, NP  09/10/2017 2:29 PM

## 2017-09-10 NOTE — Progress Notes (Signed)
PULMONARY  / CRITICAL CARE MEDICINE  Name: Jessica Howard MRN: [id number] DOB: 08-15-1960    LOS: 33  REFERRING MD :  Velvet Bathe MD  CHIEF COMPLAINT:  Back Pain   BRIEF PATIENT DESCRIPTION: Patient is a 57 y.o female with RA on immunosuppressive therapy (Levunomide and Abatacept) and DM who presented to the ED with 2 weeks of progressive myalgias and arthralgias. Initial work-up was significant for AKI, HAGMA, elevated liver enzymes, lactic acidosis, leukocytosis, and acute on chronic anemia. Blood cultures subsequently grew MSSA (4/4). Initial TTE was negative for features of endocarditis; however, patient has pathologic PE findings including splinter hemorrhages and Olser nodes. Her renal function worsened over the course of her hospitalization and she is requiring CRRT. The patient also converted to atrial fibrillation and was placed on amiodarone and lopressor. Pursuing MRI of the lumbar spine and TEE once the patient is stable and off CRRT.   LINES / TUBES: Bilateral peripheral IVs  Right IJ tunneled HD catheter  Urinary catheter   CULTURES: - 4/4 blood cultures on 3/29 for MSSA - Urine cultures on 3/29 growing MSSA - 1/2 Blood cultures on 3/30 with MSSA - Right toe superficial wound culture with gram positive cocci  - Left wrist synovial fluid with gram positive cocci   ANTIBIOTICS: - Vancomycin 3/29 -> discontinued on 3/30  - Aztreonam 3/29 -> discontinued on 3/30  - Flagyl 3/29 -> discontinued on 3/30  - Daptomycin 3/30 -> current   INTERVAL HISTORY:  Patient is much more alert this AM. She is very upset with her care and feels we are forcing things on her. She is confused and thinks her family is hiding from her. Overnight she was very agitated and not redirectable. She required a dose of Haldol.   Continuing on CRRT with removal of approximately 50cc per hour. Nephrology following.   Patient went for IR aspiration of left wrist, cultures growing gram positive cocci.    CT head/abdomen/pelvis preformed yesterday with no acute abnormalities.   A-fib with continued tachycardia on Amiodarone 30 mg /hr and Metoprolol 5 mg every 6 hours.  VITAL SIGNS: Temp:  [97 F (36.1 C)-98.5 F (36.9 C)] 97.6 F (36.4 C) (04/03 0727) Pulse Rate:  [127-144] 137 (04/03 0700) Resp:  [15-29] 24 (04/03 0700) BP: (117-162)/(63-114) 151/93 (04/03 0700) SpO2:  [91 %-100 %] 96 % (04/03 0700) Weight:  [278 lb 7.1 oz (126.3 kg)] 278 lb 7.1 oz (126.3 kg) (04/03 0600)  INTAKE / OUTPUT: Intake/Output      04/02 0701 - 04/03 0700 04/03 0701 - 04/04 0700   I.V. (mL/kg) 1207.1 (9.6)    Other 50    IV Piggyback     Total Intake(mL/kg) 1257.1 (10)    Urine (mL/kg/hr) 72 (0)    Other 1114    Total Output 1186    Net +71.1          PHYSICAL EXAMINATION:  General: Obese female resting comfortably Neuro: Alert, confused, impaired insight. HEENT: Normocephalic, atraumatic, moist mucus membranes Cardiovascular: RRR, no murmurs, no rubs Lungs: Good air movement with no wheezing or crackles  Abdomen: Active bowel sounds, soft, no tenderness to palpation  Musculoskeletal: Diffuse tenderness to palpation of the left wrist and rigth great toe, no LE edema  Skin: Warm and dry   LABS: Cbc Recent Labs  Lab 09/08/17 1325 09/09/17 0432 09/10/17 0434  WBC 24.1* 23.6* 25.3*  HGB 8.1* 8.6* 9.2*  HCT 25.7* 27.2* 28.0*  PLT 344 347 352  Chemistry Recent Labs  Lab 09/08/17 0543 09/09/17 0432 09/09/17 1600 09/10/17 0434 09/10/17 0436  NA 138 136 136  --  135  K 4.2 4.0 3.7  --  3.9  CL 102 99* 100*  --  98*  CO2 20* 22 22  --  22  BUN 55* 32* 31*  --  24*  CREATININE 4.28* 2.55* 2.47*  --  2.31*  CALCIUM 7.0* 8.1* 7.9*  --  8.4*  MG 2.4 2.5*  --  2.7*  --   PHOS 5.6* 4.7* 4.3  --  4.1  GLUCOSE 75 150* 144*  --  187*   Liver fxn Recent Labs  Lab 09/06/17 2349  09/09/17 0432 09/09/17 1004 09/09/17 1600 09/10/17 0436  AST 114*  --  102* 98*  --   --   ALT 34  --   30 30  --   --   ALKPHOS 382*  --  526* 541*  --   --   BILITOT 5.4*  --  5.4* 5.4*  --   --   PROT 6.2*  --  6.9 6.4*  --   --   ALBUMIN 1.7*   < > 1.5*  1.5* 1.5* 1.4* 1.5*   < > = values in this interval not displayed.   coags Recent Labs  Lab 08/15/2017 1531 09/09/17 1004  APTT 36  --   INR 1.45 1.69   Sepsis markers Recent Labs  Lab 08/08/2017 1531 08/24/2017 2001 09/06/17 0310 09/06/17 2349 09/09/17 1004 09/09/17 1241  LATICACIDVEN  --  1.9  --   --  1.5 1.2  PROCALCITON 11.11  --  18.40 20.69  --   --    Cardiac markers Recent Labs  Lab 08/17/2017 0632 09/06/17 1030 09/10/17 0434  CKTOTAL 55 233 131   BNP No results for input(s): PROBNP in the last 168 hours.   ABG Recent Labs  Lab 08/15/2017 0805 09/09/17 1045  HCO3 20.3 22.6  TCO2 21* 24   CBG trend Recent Labs  Lab 09/09/17 1523 09/09/17 2027 09/09/17 2324 09/10/17 0334 09/10/17 0725  GLUCAP 135* 136* 145* 182* 164*   ECG: Tachycardia with irregularly irregular rhythm. Normal axis. Narrow QRS. No discernable P waves noted. Consistent with atrial fibrillation.   DIAGNOSES: Principal Problem:   Bacteremia due to methicillin susceptible Staphylococcus aureus (MSSA) Active Problems:   Thalassemia   Rheumatoid arthritis ([place])   Morbid obesity ([place])   Acute back pain   Essential hypertension   Diabetes ([place])   Dyslipidemia   Sepsis ([place])   Acute kidney injury ([place])   Immunosuppression due to drug therapy   Rheumatoid arthritis(714.0)   Acute pain of left wrist   Great toe pain, right   Hypertension   Hyperlipidemia   Acute hearing loss of left ear   Diabetes mellitus, insulin dependent (IDDM), controlled (HCC)   Beta thalassemia (HCC)   Symptomatic anemia   Elevated liver enzymes   Hyperbilirubinemia  ASSESSMENT / PLAN: 57 y.o female with RA on immunosuppressive therapy (Levunomide and Abatacept) who presented to the ED on 3/29 with sepsis and multiorgan failure secondary to MSSA  Bacteremia/Endocaridits. She was subsequently started on CRRT on 3/31.   A: Sepsis secondary to MSSA Bacteremia/Endocarditis  Patient continues to have positive blood cultures. Pathological finding consistent with endocarditis in spite on normal TTE. [place] right great toe abscess, left hand cellulitis, and right hand cellulitis.  Left wrist aspirated yesterday and cultures growing gram positive cocci.  CT head/abdomen/pelvis with  no acute abnormalities.   P:   Infectious disease onboard  Orthopedic surgery onboard  Continuing daptomycin Last blood cultures from 3/31, will need to repeat today.  Will need either TEE and/or MRI lumbar spine.   CARDIOVASCULAR A:  Hypotension Patient is hypertensive at baseline on bystolic, irbesartan, and spironolactone. Currently on CRRT.  Atrial Fibrillation 2/2 sepsis  TTE without atrial enlargement or dilation  On amiodarone and metoprolol   P:  Currently still in atrial fibrillation with RVR. Currently on amiodarone 30 mg/hr and metoprolol 5 mg every 6 hours. Will increase metoprolol.  Continue to hold home BP medications.  Maintain MAP >65   RENAL A: AKI secondary to MSSA bacteremia, blood clots in catheter  Creatine elevated from baseline of 0.9 to 3.36 on admission  UA with hematuria, proteinuria, and bacteruria. Cultures growing Staph aureus.  P:   Patient remains anuric. Started continuous bladder irrigation on 4/2. Nephrology onboard. Maintaining MAP on CRRT  PULMONARY A: Obstructive Sleep Apnea  On CPAP at home   P:   CPAP per RT  GASTROINTESTINAL A: Elevated LFTs ? Cholestatic liver injury  Presented with gross elevation in alk phos and direct bilirubin with minor elevation in LFTs and indirect bilirubin.  Abdominal ultrasound unremarkable for acute etiology with no pericholecystic fluid, wall thickening, gallstones, or ductal dilation.  CT abdomen and pelvis yesterday with normal liver, intra-/extrahepatic biliary  duct, and gallbladder.  Ammonium elevated to 44, INR 1.65  P:   Will continue to monitor   HEMATOLOGIC A:  Beta Thalassemia  Acute on chronic anemia secondary to sepsis Hgb dropped from baseline of ~10 to 7.6 on admission. Hgb back up to 9.2 today  LDH elevated but haptoglobin elevated and reticulocyte WNL therefore, unlikely to be 2/2 hemolytic anemia.   P:  Has been evaluated by hematology, no further recommendations.  Transfusion goals: Transfuse 1 units pRBCs if <7 or <8 if patient becomes hemodynamically unstable and/or has active bleeding   ENDOCRINE A: Insulin Dependent Type 2 DM On Humolin 75 units TID at home   P:   CBG goal 140-180  Continue SSI-moderate   NEUROLOGICAL A: Altered Mental Status  Multiple contributing factors including A-fib, sepsis, opiates ABG on 4/2: 7.43/33.8/42/22 Ammonium 44  CT head without acute abnormalities   P:  Improved compared to day prior  Will continue treatment for MSSA bacteremia as outlined above  Continue treatment for A-fib as outlined above  Fentanyl for pain control   Pulmonary and Critical Care Medicine [facility] Pager: [phone]  09/10/2017, 7:55 AM

## 2017-09-11 ENCOUNTER — Encounter (HOSPITAL_COMMUNITY): Payer: Self-pay | Admitting: *Deleted

## 2017-09-11 ENCOUNTER — Inpatient Hospital Stay (HOSPITAL_COMMUNITY): Payer: Medicare Other | Admitting: Certified Registered Nurse Anesthetist

## 2017-09-11 ENCOUNTER — Encounter (HOSPITAL_COMMUNITY): Admission: EM | Disposition: E | Payer: Self-pay | Source: Home / Self Care | Attending: Internal Medicine

## 2017-09-11 ENCOUNTER — Inpatient Hospital Stay (HOSPITAL_COMMUNITY): Payer: Medicare Other

## 2017-09-11 DIAGNOSIS — R451 Restlessness and agitation: Secondary | ICD-10-CM

## 2017-09-11 DIAGNOSIS — L089 Local infection of the skin and subcutaneous tissue, unspecified: Secondary | ICD-10-CM

## 2017-09-11 DIAGNOSIS — G061 Intraspinal abscess and granuloma: Secondary | ICD-10-CM

## 2017-09-11 DIAGNOSIS — Z95828 Presence of other vascular implants and grafts: Secondary | ICD-10-CM

## 2017-09-11 DIAGNOSIS — I483 Typical atrial flutter: Secondary | ICD-10-CM

## 2017-09-11 DIAGNOSIS — R4182 Altered mental status, unspecified: Secondary | ICD-10-CM

## 2017-09-11 DIAGNOSIS — M009 Pyogenic arthritis, unspecified: Secondary | ICD-10-CM

## 2017-09-11 DIAGNOSIS — I361 Nonrheumatic tricuspid (valve) insufficiency: Secondary | ICD-10-CM

## 2017-09-11 DIAGNOSIS — Z79899 Other long term (current) drug therapy: Secondary | ICD-10-CM

## 2017-09-11 DIAGNOSIS — I4892 Unspecified atrial flutter: Secondary | ICD-10-CM

## 2017-09-11 HISTORY — PX: I&D EXTREMITY: SHX5045

## 2017-09-11 HISTORY — PX: TEE WITHOUT CARDIOVERSION: SHX5443

## 2017-09-11 HISTORY — PX: CARDIOVERSION: SHX1299

## 2017-09-11 LAB — TRIGLYCERIDES: Triglycerides: 172 mg/dL — ABNORMAL HIGH (ref <150)

## 2017-09-11 LAB — HEPATIC FUNCTION PANEL
ALT: 28 U/L (ref 14–54)
AST: 67 U/L — AB (ref 15–41)
Albumin: 1.4 g/dL — ABNORMAL LOW (ref 3.5–5.0)
Alkaline Phosphatase: 527 U/L — ABNORMAL HIGH (ref 38–126)
BILIRUBIN DIRECT: 3.5 mg/dL — AB (ref 0.1–0.5)
BILIRUBIN INDIRECT: 2 mg/dL — AB (ref 0.3–0.9)
BILIRUBIN TOTAL: 5.5 mg/dL — AB (ref 0.3–1.2)
Total Protein: 6.1 g/dL — ABNORMAL LOW (ref 6.5–8.1)

## 2017-09-11 LAB — GLUCOSE, CAPILLARY
GLUCOSE-CAPILLARY: 176 mg/dL — AB (ref 65–99)
Glucose-Capillary: 152 mg/dL — ABNORMAL HIGH (ref 65–99)
Glucose-Capillary: 173 mg/dL — ABNORMAL HIGH (ref 65–99)
Glucose-Capillary: 198 mg/dL — ABNORMAL HIGH (ref 65–99)
Glucose-Capillary: 201 mg/dL — ABNORMAL HIGH (ref 65–99)
Glucose-Capillary: 203 mg/dL — ABNORMAL HIGH (ref 65–99)

## 2017-09-11 LAB — RENAL FUNCTION PANEL
ANION GAP: 12 (ref 5–15)
Albumin: 1.8 g/dL — ABNORMAL LOW (ref 3.5–5.0)
BUN: 28 mg/dL — AB (ref 6–20)
CHLORIDE: 102 mmol/L (ref 101–111)
CO2: 22 mmol/L (ref 22–32)
Calcium: 8.1 mg/dL — ABNORMAL LOW (ref 8.9–10.3)
Creatinine, Ser: 2.24 mg/dL — ABNORMAL HIGH (ref 0.44–1.00)
GFR calc Af Amer: 27 mL/min — ABNORMAL LOW (ref >60)
GFR calc non Af Amer: 23 mL/min — ABNORMAL LOW (ref >60)
GLUCOSE: 214 mg/dL — AB (ref 65–99)
POTASSIUM: 4.3 mmol/L (ref 3.5–5.1)
Phosphorus: 4.2 mg/dL (ref 2.5–4.6)
Sodium: 136 mmol/L (ref 135–145)

## 2017-09-11 LAB — CBC
HEMATOCRIT: 24.5 % — AB (ref 36.0–46.0)
Hemoglobin: 8 g/dL — ABNORMAL LOW (ref 12.0–15.0)
MCH: 21.3 pg — ABNORMAL LOW (ref 26.0–34.0)
MCHC: 32.7 g/dL (ref 30.0–36.0)
MCV: 65.2 fL — AB (ref 78.0–100.0)
PLATELETS: 266 10*3/uL (ref 150–400)
RBC: 3.76 MIL/uL — AB (ref 3.87–5.11)
RDW: 23.6 % — ABNORMAL HIGH (ref 11.5–15.5)
WBC: 20.3 10*3/uL — AB (ref 4.0–10.5)

## 2017-09-11 LAB — ECHO INTRAOPERATIVE TEE
Height: 62 in
WEIGHTICAEL: 4400 [oz_av]

## 2017-09-11 LAB — MAGNESIUM: Magnesium: 2.6 mg/dL — ABNORMAL HIGH (ref 1.7–2.4)

## 2017-09-11 LAB — HEPARIN LEVEL (UNFRACTIONATED): Heparin Unfractionated: <0.1 IU/mL — ABNORMAL LOW (ref 0.30–0.70)

## 2017-09-11 SURGERY — IRRIGATION AND DEBRIDEMENT EXTREMITY
Anesthesia: General | Laterality: Bilateral

## 2017-09-11 MED ORDER — INSULIN ASPART 100 UNIT/ML ~~LOC~~ SOLN: 0.0000 [IU] | Freq: Three times a day (TID) | SUBCUTANEOUS | Status: DC

## 2017-09-11 MED ORDER — PROPOFOL 10 MG/ML IV BOLUS
INTRAVENOUS | Status: AC
  Filled 2017-09-11: qty 20

## 2017-09-11 MED ORDER — ALBUMIN HUMAN 5 % IV SOLN
INTRAVENOUS | Status: DC | PRN
  Administered 2017-09-11: 16:00:00 via INTRAVENOUS

## 2017-09-11 MED ORDER — HEPARIN (PORCINE) IN NACL 100-0.45 UNIT/ML-% IJ SOLN
1400.0000 [IU]/h | INTRAMUSCULAR | Status: DC
  Administered 2017-09-11: 1400 [IU]/h via INTRAVENOUS
  Filled 2017-09-11 (×3): qty 250

## 2017-09-11 MED ORDER — LORAZEPAM 2 MG/ML IJ SOLN
1.0000 mg | Freq: Three times a day (TID) | INTRAMUSCULAR | Status: DC | PRN
  Administered 2017-09-11 – 2017-09-23 (×4): 1 mg via INTRAVENOUS
  Filled 2017-09-11 (×4): qty 1

## 2017-09-11 MED ORDER — DILTIAZEM LOAD VIA INFUSION
20.0000 mg | Freq: Once | INTRAVENOUS | Status: AC
  Administered 2017-09-11: 20 mg via INTRAVENOUS
  Filled 2017-09-11: qty 20

## 2017-09-11 MED ORDER — SODIUM CHLORIDE 0.9 % IR SOLN
Status: DC | PRN
  Administered 2017-09-11 (×4): 3000 mL
  Administered 2017-09-11: 1000 mL
  Administered 2017-09-11: 3000 mL

## 2017-09-11 MED ORDER — MIDAZOLAM HCL 2 MG/2ML IJ SOLN
INTRAMUSCULAR | Status: AC
  Filled 2017-09-11: qty 2

## 2017-09-11 MED ORDER — SODIUM CHLORIDE 0.9 % IV SOLN
0.0000 ug/min | INTRAVENOUS | Status: DC
  Administered 2017-09-11: 20 ug/min via INTRAVENOUS
  Administered 2017-09-12: 40 ug/min via INTRAVENOUS
  Filled 2017-09-11: qty 1
  Filled 2017-09-11: qty 10

## 2017-09-11 MED ORDER — LORAZEPAM 2 MG/ML IJ SOLN
1.0000 mg | Freq: Once | INTRAMUSCULAR | Status: AC
  Administered 2017-09-11: 1 mg via INTRAVENOUS

## 2017-09-11 MED ORDER — DEXTROSE 5 % IV SOLN
INTRAVENOUS | Status: DC | PRN
  Administered 2017-09-11: 30 ug/min via INTRAVENOUS

## 2017-09-11 MED ORDER — LORAZEPAM 2 MG/ML IJ SOLN
INTRAMUSCULAR | Status: AC
  Administered 2017-09-11: 1 mg via INTRAVENOUS
  Filled 2017-09-11: qty 1

## 2017-09-11 MED ORDER — FENTANYL CITRATE (PF) 100 MCG/2ML IJ SOLN: 25.0000 ug | INTRAMUSCULAR | Status: DC | PRN

## 2017-09-11 MED ORDER — PROPOFOL 10 MG/ML IV BOLUS
INTRAVENOUS | Status: DC | PRN
  Administered 2017-09-11: 10 mg via INTRAVENOUS
  Administered 2017-09-11: 70 mg via INTRAVENOUS
  Administered 2017-09-11 (×4): 10 mg via INTRAVENOUS

## 2017-09-11 MED ORDER — MIDAZOLAM HCL 2 MG/2ML IJ SOLN
INTRAMUSCULAR | Status: DC | PRN
  Administered 2017-09-11: 2 mg via INTRAVENOUS

## 2017-09-11 MED ORDER — FENTANYL 2500MCG IN NS 250ML (10MCG/ML) PREMIX INFUSION
25.0000 ug/h | INTRAVENOUS | Status: DC
  Administered 2017-09-15: 200 ug/h via INTRAVENOUS
  Administered 2017-09-15: 100 ug/h via INTRAVENOUS
  Administered 2017-09-16: 150 ug/h via INTRAVENOUS
  Administered 2017-09-17: 300 ug/h via INTRAVENOUS
  Administered 2017-09-17: 200 ug/h via INTRAVENOUS
  Administered 2017-09-17 – 2017-09-19 (×5): 300 ug/h via INTRAVENOUS
  Administered 2017-09-19: 200 ug/h via INTRAVENOUS
  Administered 2017-09-20: 20 ug/h via INTRAVENOUS
  Administered 2017-09-20: 200 ug/h via INTRAVENOUS
  Administered 2017-09-21: 250 ug/h via INTRAVENOUS
  Administered 2017-09-21 – 2017-09-22 (×3): 300 ug/h via INTRAVENOUS
  Filled 2017-09-11 (×17): qty 250

## 2017-09-11 MED ORDER — EPHEDRINE SULFATE 50 MG/ML IJ SOLN
INTRAMUSCULAR | Status: DC | PRN
  Administered 2017-09-11: 15 mg via INTRAVENOUS

## 2017-09-11 MED ORDER — OXYCODONE HCL 5 MG PO TABS: 5.0000 mg | ORAL_TABLET | Freq: Once | ORAL | Status: DC | PRN

## 2017-09-11 MED ORDER — OXYCODONE HCL 5 MG/5ML PO SOLN: 5.0000 mg | Freq: Once | ORAL | Status: DC | PRN

## 2017-09-11 MED ORDER — FENTANYL CITRATE (PF) 100 MCG/2ML IJ SOLN
INTRAMUSCULAR | Status: AC
  Filled 2017-09-11: qty 2

## 2017-09-11 MED ORDER — FENTANYL CITRATE (PF) 100 MCG/2ML IJ SOLN
50.0000 ug | Freq: Once | INTRAMUSCULAR | Status: AC
  Administered 2017-09-11: 50 ug via INTRAVENOUS

## 2017-09-11 MED ORDER — DILTIAZEM HCL 100 MG IV SOLR
5.0000 mg/h | INTRAVENOUS | Status: DC
  Administered 2017-09-11: 5 mg/h via INTRAVENOUS
  Administered 2017-09-11: 15 mg/h via INTRAVENOUS
  Filled 2017-09-11 (×2): qty 100

## 2017-09-11 MED ORDER — ORAL CARE MOUTH RINSE
15.0000 mL | Freq: Four times a day (QID) | OROMUCOSAL | Status: DC
  Administered 2017-09-12 – 2017-09-13 (×6): 15 mL via OROMUCOSAL

## 2017-09-11 MED ORDER — SUCCINYLCHOLINE CHLORIDE 20 MG/ML IJ SOLN
INTRAMUSCULAR | Status: DC | PRN
  Administered 2017-09-11: 120 mg via INTRAVENOUS

## 2017-09-11 MED ORDER — INSULIN ASPART 100 UNIT/ML ~~LOC~~ SOLN: 0.0000 [IU] | SUBCUTANEOUS | Status: DC

## 2017-09-11 MED ORDER — FENTANYL BOLUS VIA INFUSION
50.0000 ug | INTRAVENOUS | Status: DC | PRN
  Administered 2017-09-16 – 2017-09-22 (×13): 50 ug via INTRAVENOUS
  Filled 2017-09-11: qty 50

## 2017-09-11 MED ORDER — FENTANYL CITRATE (PF) 250 MCG/5ML IJ SOLN
INTRAMUSCULAR | Status: AC
  Filled 2017-09-11: qty 5

## 2017-09-11 MED ORDER — PROPOFOL 1000 MG/100ML IV EMUL
5.0000 ug/kg/min | INTRAVENOUS | Status: DC
  Administered 2017-09-12: 26.667 ug/kg/min via INTRAVENOUS
  Administered 2017-09-12 (×3): 30 ug/kg/min via INTRAVENOUS
  Administered 2017-09-12: 20 ug/kg/min via INTRAVENOUS
  Administered 2017-09-12: 30 ug/kg/min via INTRAVENOUS
  Administered 2017-09-13 (×2): 40 ug/kg/min via INTRAVENOUS
  Administered 2017-09-13 (×3): 30 ug/kg/min via INTRAVENOUS
  Administered 2017-09-14 (×4): 45 ug/kg/min via INTRAVENOUS
  Administered 2017-09-14: 45.067 ug/kg/min via INTRAVENOUS
  Administered 2017-09-14: 45 ug/kg/min via INTRAVENOUS
  Administered 2017-09-14: 40 ug/kg/min via INTRAVENOUS
  Administered 2017-09-14: 50 ug/kg/min via INTRAVENOUS
  Administered 2017-09-15: 40 ug/kg/min via INTRAVENOUS
  Administered 2017-09-15: 50 ug/kg/min via INTRAVENOUS
  Administered 2017-09-15 (×2): 60 ug/kg/min via INTRAVENOUS
  Filled 2017-09-11 (×23): qty 100

## 2017-09-11 MED ORDER — CHLORHEXIDINE GLUCONATE 0.12% ORAL RINSE (MEDLINE KIT)
15.0000 mL | Freq: Two times a day (BID) | OROMUCOSAL | Status: DC
  Administered 2017-09-11 – 2017-09-22 (×23): 15 mL via OROMUCOSAL

## 2017-09-11 MED ORDER — CISATRACURIUM BESYLATE (PF) 10 MG/5ML IV SOLN
INTRAVENOUS | Status: DC | PRN
  Administered 2017-09-11 (×2): 8 mg via INTRAVENOUS
  Administered 2017-09-11: 10 mg via INTRAVENOUS
  Administered 2017-09-11: 14 mg via INTRAVENOUS

## 2017-09-11 MED ORDER — ONDANSETRON HCL 4 MG/2ML IJ SOLN: 4.0000 mg | Freq: Four times a day (QID) | INTRAMUSCULAR | Status: DC | PRN

## 2017-09-11 MED ORDER — SODIUM CHLORIDE 0.9 % IV SOLN
INTRAVENOUS | Status: DC
  Administered 2017-09-11 (×2): via INTRAVENOUS

## 2017-09-11 MED ORDER — PROPOFOL 1000 MG/100ML IV EMUL
5.0000 ug/kg/min | INTRAVENOUS | Status: DC
  Administered 2017-09-11: 26.731 ug/kg/min via INTRAVENOUS
  Filled 2017-09-11: qty 100

## 2017-09-11 MED ORDER — CISATRACURIUM BESYLATE 20 MG/10ML IV SOLN
INTRAVENOUS | Status: AC
  Filled 2017-09-11: qty 10

## 2017-09-11 MED ORDER — DEXMEDETOMIDINE HCL IN NACL 200 MCG/50ML IV SOLN
INTRAVENOUS | Status: DC | PRN
  Administered 2017-09-11: .7 ug/kg/h via INTRAVENOUS

## 2017-09-11 MED ORDER — FENTANYL CITRATE (PF) 250 MCG/5ML IJ SOLN
INTRAMUSCULAR | Status: DC | PRN
  Administered 2017-09-11: 50 ug via INTRAVENOUS

## 2017-09-11 MED ORDER — DEXMEDETOMIDINE HCL IN NACL 200 MCG/50ML IV SOLN
0.4000 ug/kg/h | INTRAVENOUS | Status: DC
  Administered 2017-09-11: 0.9 ug/kg/h via INTRAVENOUS
  Filled 2017-09-11 (×2): qty 50

## 2017-09-11 MED ORDER — PHENYLEPHRINE HCL 10 MG/ML IJ SOLN
INTRAMUSCULAR | Status: DC | PRN
  Administered 2017-09-11 (×3): 120 ug via INTRAVENOUS
  Administered 2017-09-11: 40 ug via INTRAVENOUS

## 2017-09-11 SURGICAL SUPPLY — 56 items
BANDAGE ACE 3X5.8 VEL STRL LF (GAUZE/BANDAGES/DRESSINGS) ×2 IMPLANT
BANDAGE ACE 4X5 VEL STRL LF (GAUZE/BANDAGES/DRESSINGS) ×2 IMPLANT
BANDAGE ACE 6X5 VEL STRL LF (GAUZE/BANDAGES/DRESSINGS) ×2 IMPLANT
BNDG COHESIVE 3X5 TAN STRL LF (GAUZE/BANDAGES/DRESSINGS) ×4 IMPLANT
BNDG COHESIVE 4X5 TAN STRL (GAUZE/BANDAGES/DRESSINGS) ×4 IMPLANT
BNDG GAUZE ELAST 4 BULKY (GAUZE/BANDAGES/DRESSINGS) ×2 IMPLANT
BOOTCOVER CLEANROOM LRG (PROTECTIVE WEAR) ×8 IMPLANT
CANISTER WOUNDNEG PRESSURE 500 (CANNISTER) ×2 IMPLANT
CASSETTE VERAFLO VERALINK (MISCELLANEOUS) ×2 IMPLANT
COVER SURGICAL LIGHT HANDLE (MISCELLANEOUS) ×6 IMPLANT
CUFF TOURNIQUET SINGLE 34IN LL (TOURNIQUET CUFF) IMPLANT
DRAIN PENROSE 1/4X12 LTX STRL (WOUND CARE) ×2 IMPLANT
DRESSING VERAFLO CLEANSE CC (GAUZE/BANDAGES/DRESSINGS) IMPLANT
DRSG VERAFLO CLEANSE CC (GAUZE/BANDAGES/DRESSINGS) ×4
DRSG VERAFLO VAC MED (GAUZE/BANDAGES/DRESSINGS) ×2 IMPLANT
DURAPREP 26ML APPLICATOR (WOUND CARE) ×2 IMPLANT
ELECT REM PT RETURN 9FT ADLT (ELECTROSURGICAL)
ELECT SOLID GEL RDN PRO-PADZ (MISCELLANEOUS) ×4
ELECTRODE REM PT RTRN 9FT ADLT (ELECTROSURGICAL) IMPLANT
ELECTRODE SOLI GEL RDN PROPADZ (MISCELLANEOUS) IMPLANT
EVACUATOR 1/8 PVC DRAIN (DRAIN) IMPLANT
FACESHIELD WRAPAROUND (MASK) ×4 IMPLANT
FACESHIELD WRAPAROUND OR TEAM (MASK) IMPLANT
GAUZE SPONGE 4X4 12PLY STRL (GAUZE/BANDAGES/DRESSINGS) ×6 IMPLANT
GAUZE XEROFORM 1X8 LF (GAUZE/BANDAGES/DRESSINGS) ×6 IMPLANT
GLOVE BIOGEL PI ORTHO PRO SZ8 (GLOVE) ×2
GLOVE ORTHO TXT STRL SZ7.5 (GLOVE) ×4 IMPLANT
GLOVE PI ORTHO PRO STRL SZ8 (GLOVE) ×2 IMPLANT
GLOVE SURG ORTHO 8.0 STRL STRW (GLOVE) ×8 IMPLANT
GOWN STRL REUS W/ TWL LRG LVL3 (GOWN DISPOSABLE) IMPLANT
GOWN STRL REUS W/TWL LRG LVL3 (GOWN DISPOSABLE) ×4
HANDPIECE INTERPULSE COAX TIP (DISPOSABLE)
KIT BASIN OR (CUSTOM PROCEDURE TRAY) ×4 IMPLANT
KIT TURNOVER KIT B (KITS) ×4 IMPLANT
MANIFOLD NEPTUNE II (INSTRUMENTS) ×4 IMPLANT
NS IRRIG 1000ML POUR BTL (IV SOLUTION) ×4 IMPLANT
PACK ORTHO EXTREMITY (CUSTOM PROCEDURE TRAY) ×4 IMPLANT
PAD ARMBOARD 7.5X6 YLW CONV (MISCELLANEOUS) ×8 IMPLANT
SCRUB BETADINE 4OZ XXX (MISCELLANEOUS) ×4 IMPLANT
SET HNDPC FAN SPRY TIP SCT (DISPOSABLE) IMPLANT
SOLUTION BETADINE 4OZ (MISCELLANEOUS) ×4 IMPLANT
SPONGE LAP 18X18 X RAY DECT (DISPOSABLE) ×4 IMPLANT
STOCKINETTE IMPERVIOUS 9X36 MD (GAUZE/BANDAGES/DRESSINGS) ×4 IMPLANT
SUCTION FRAZIER HANDLE 10FR (MISCELLANEOUS) ×2
SUCTION TUBE FRAZIER 10FR DISP (MISCELLANEOUS) IMPLANT
SUT ETHILON 3 0 PS 1 (SUTURE) ×6 IMPLANT
SWAB CULTURE ESWAB REG 1ML (MISCELLANEOUS) IMPLANT
SYR BULB IRRIGATION 50ML (SYRINGE) ×2 IMPLANT
TOWEL OR 17X24 6PK STRL BLUE (TOWEL DISPOSABLE) ×2 IMPLANT
TOWEL OR 17X26 10 PK STRL BLUE (TOWEL DISPOSABLE) ×4 IMPLANT
TUBE CONNECTING 12'X1/4 (SUCTIONS) ×1
TUBE CONNECTING 12X1/4 (SUCTIONS) ×3 IMPLANT
TUBING VERAFLO DUO SET (SET/KITS/TRAYS/PACK) ×2 IMPLANT
UNDERPAD 30X30 (UNDERPADS AND DIAPERS) ×8 IMPLANT
WATER STERILE IRR 1000ML POUR (IV SOLUTION) ×2 IMPLANT
YANKAUER SUCT BULB TIP NO VENT (SUCTIONS) ×4 IMPLANT

## 2017-09-11 NOTE — Anesthesia Procedure Notes (Signed)
Procedure Name: Intubation Date/Time: 10/02/2017 2:40 PM Performed by: White, Amedeo Plenty, CRNA Pre-anesthesia Checklist: Patient identified, Emergency Drugs available, Suction available and Patient being monitored Patient Re-evaluated:Patient Re-evaluated prior to induction Oxygen Delivery Method: Circle System Utilized Preoxygenation: Pre-oxygenation with 100% oxygen Induction Type: IV induction and Rapid sequence Laryngoscope Size: Glidescope and 3 (Elective glidescope due to body habitus) Grade View: Grade I Tube type: Oral Tube size: 7.0 mm Number of attempts: 1 Airway Equipment and Method: Stylet Placement Confirmation: ETT inserted through vocal cords under direct vision,  positive ETCO2 and breath sounds checked- equal and bilateral Secured at: 22 cm Tube secured with: Tape Dental Injury: Teeth and Oropharynx as per pre-operative assessment

## 2017-09-11 NOTE — Progress Notes (Signed)
10cc blood wasted from blue port of [place] and amiodarone gtt moved to [facility].

## 2017-09-11 NOTE — Progress Notes (Signed)
PULMONARY  / CRITICAL CARE MEDICINE  Name: Jessica Howard MRN: [id number] DOB: 1961/04/29    LOS: 3  REFERRING MD :  Velvet Bathe MD  CHIEF COMPLAINT:  Back Pain   BRIEF PATIENT DESCRIPTION: Patient is a 57 y.o female with RA on immunosuppressive therapy (Levunomide and Abatacept) and DM who presented to the ED with 2 weeks of progressive myalgias and arthralgias. Initial work-up was significant for AKI, HAGMA, elevated liver enzymes, lactic acidosis, leukocytosis, and acute on chronic anemia. Blood cultures subsequently grew MSSA (4/4). Initial TTE was negative for features of endocarditis; however, patient has pathologic PE findings including splinter hemorrhages and Olser nodes. Her renal function worsened over the course of her hospitalization and she is requiring CRRT. The patient also converted to atrial fibrillation and was placed on amiodarone and lopressor. Pursuing MRI of the lumbar spine and TEE once the patient is stable and off CRRT.   LINES / TUBES: Bilateral peripheral IVs  Right IJ tunneled HD catheter  Urinary catheter -> removed 4/3  CULTURES: - 4/4 blood cultures on 3/29 for MSSA - Urine cultures on 3/29 growing MSSA - 1/2 Blood cultures on 3/30 with MSSA - Right toe superficial wound culture with staph aureus  - Left wrist synovial fluid with gram positive cocci   ANTIBIOTICS: - Vancomycin 3/29 -> discontinued on 3/30  - Aztreonam 3/29 -> discontinued on 3/30  - Flagyl 3/29 -> discontinued on 3/30  - Daptomycin 3/30 -> current   INTERVAL HISTORY:  Patient restless this AM and appears to be confused this AM. Per nursing patient was very restless overnight. Requiring Ativan. Otherwise a fairly uneventful night.   Orthopedic surgery planning to take the patient for wash out of left wrist and right great toes today. During surgery we will obtain a TEE.   Cardiology recommending adding on Diltiazem for better rate control.  VITAL SIGNS: Temp:  [97.6 F (36.4  C)-98.4 F (36.9 C)] 98 F (36.7 C) (04/04 0348) Pulse Rate:  [111-140] 112 (04/04 0500) Resp:  [16-60] 29 (04/04 0500) BP: (82-154)/(62-129) 110/72 (04/04 0500) SpO2:  [95 %-100 %] 98 % (04/04 0500) Weight:  [275 lb 9.2 oz (125 kg)-278 lb 7.1 oz (126.3 kg)] 275 lb 9.2 oz (125 kg) (04/04 0400)  INTAKE / OUTPUT: Intake/Output      04/03 0701 - 04/04 0700   P.O. 280   I.V. (mL/kg) 1179.7 (9.4)   Total Intake(mL/kg) 1459.7 (11.7)   Urine (mL/kg/hr) 30 (0)   Other 1830   Total Output 1860   Net -400.4        PHYSICAL EXAMINATION:  General: Obese female, restless Neuro: Alert, confused, impaired insight. HEENT: Normocephalic, atraumatic, moist mucus membranes Cardiovascular: RRR, no murmurs, no rubs Lungs: Good air movement with no wheezing or crackles  Abdomen: Active bowel sounds, soft, no tenderness to palpation  Musculoskeletal: Mild LE edema  Skin: Warm and dry   LABS: Cbc Recent Labs  Lab 09/09/17 0432 09/10/17 0434 09/12/2017 0409  WBC 23.6* 25.3* 20.3*  HGB 8.6* 9.2* 8.0*  HCT 27.2* 28.0* 24.5*  PLT 347 352 266   Chemistry Recent Labs  Lab 09/09/17 0432 09/09/17 1600 09/10/17 0434 09/10/17 0436 09/10/17 1806 10/01/2017 0409  NA 136 136  --  135 135  --   K 4.0 3.7  --  3.9 3.5  --   CL 99* 100*  --  98* 101  --   CO2 22 22  --  22 22  --  BUN 32* 31*  --  24* 25*  --   CREATININE 2.55* 2.47*  --  2.31* 2.32*  --   CALCIUM 8.1* 7.9*  --  8.4* 8.1*  --   MG 2.5*  --  2.7*  --   --  2.6*  PHOS 4.7* 4.3  --  4.1 4.3  --   GLUCOSE 150* 144*  --  187* 215*  --    Liver fxn Recent Labs  Lab 09/09/17 0432 09/09/17 1004  09/10/17 0436 09/10/17 1806 09/10/2017 0409  AST 102* 98*  --   --   --  67*  ALT 30 30  --   --   --  28  ALKPHOS 526* 541*  --   --   --  527*  BILITOT 5.4* 5.4*  --   --   --  5.5*  PROT 6.9 6.4*  --   --   --  6.1*  ALBUMIN 1.5*  1.5* 1.5*   < > 1.5* 1.4* 1.4*   < > = values in this interval not displayed.   coags Recent Labs   Lab 08/27/2017 1531 09/09/17 1004  APTT 36  --   INR 1.45 1.69   Sepsis markers Recent Labs  Lab 08/26/2017 1531 08/10/2017 2001 09/06/17 0310 09/06/17 2349 09/09/17 1004 09/09/17 1241  LATICACIDVEN  --  1.9  --   --  1.5 1.2  PROCALCITON 11.11  --  18.40 20.69  --   --    Cardiac markers Recent Labs  Lab 08/22/2017 0632 09/06/17 1030 09/10/17 0434  CKTOTAL 55 233 131   BNP No results for input(s): PROBNP in the last 168 hours.   ABG Recent Labs  Lab 08/31/2017 0805 09/09/17 1045  HCO3 20.3 22.6  TCO2 21* 24   CBG trend Recent Labs  Lab 09/10/17 1139 09/10/17 1811 09/10/17 1930 09/10/17 2334 09/09/2017 0347  GLUCAP 173* 204* 177* 155* 173*   ECG: Tachycardia with irregularly irregular rhythm. Normal axis. Narrow QRS. No discernable P waves noted. Consistent with atrial fibrillation.   DIAGNOSES: Principal Problem:   Bacteremia due to methicillin susceptible Staphylococcus aureus (MSSA) Active Problems:   Thalassemia   Rheumatoid arthritis ([place])   Morbid obesity ([place])   Acute back pain   Essential hypertension   Diabetes ([place])   Dyslipidemia   Sepsis ([place])   Acute kidney injury ([place])   Immunosuppression due to drug therapy   Rheumatoid arthritis(714.0)   Acute pain of left wrist   Great toe pain, right   Hypertension   Hyperlipidemia   Acute hearing loss of left ear   Diabetes mellitus, insulin dependent (IDDM), controlled (HCC)   Beta thalassemia (HCC)   Symptomatic anemia   Elevated liver enzymes   Hyperbilirubinemia  ASSESSMENT / PLAN: 57 y.o female with RA on immunosuppressive therapy (Levunomide and Abatacept) who presented to the ED on 3/29 with sepsis and multiorgan failure secondary to MSSA Bacteremia/Endocaridits. She was subsequently started on CRRT on 3/31.   INFECTIOUS A: Sepsis secondary to MSSA Bacteremia/Endocarditis  Patient continues to have positive blood cultures. Pathological finding consistent with endocarditis in spite on  normal TTE. [place] right great toe abscess, left hand cellulitis, and right hand cellulitis.  Left wrist aspirated yesterday and cultures growing gram positive cocci.  CT head/abdomen/pelvis with no acute abnormalities.  MRI of the lumbar spine showing findings consistent with septic arthritis of the right L1-2 facet with 2.3 cm posterior collection.  MRI of the right foot showing findings  consistent with septic arthritis of the first IP with associated osteomyelitis.   P:   Infectious disease onboard  Orthopedic surgery onboard,   Continuing daptomycin Last blood cultures from 3/31, attempting to get repeat BCs MRI of the lumbar spines illustrating septic arthritis with fluid collection of the right L1-2 facet. MRI of the right foot showing septic arthritis with associated osteomyelitis of the right first IP MRI of the left wrist unable to be obtained due to restlessness  Going to the OR today for washout of her left wrist TEE today in the OR  CARDIOVASCULAR A:  Hypotension Patient is hypertensive at baseline on bystolic, irbesartan, and spironolactone. Currently on CRRT.  Atrial Fibrillation 2/2 sepsis  TTE without atrial enlargement or dilation  On amiodarone and metoprolol   P:  Cardiology onboard.  Currently in atrial fibrillation with RVR. Currently on amiodarone 30 mg/hr and metoprolol 10 mg every 6 hours.  Will add on Diltiazem for better rate control  Heparin stopped at midnight as patient is going to the OR today.  Stopped IVF due to LE edema  Maintain MAP >65   RENAL A: AKI secondary to MSSA bacteremia, blood clots in catheter  Creatine elevated from baseline of 0.9 to 3.36 on admission  UA with hematuria, proteinuria, and bacteruria. Cultures growing Staph aureus.  P:   Patient remains anuric. Bladder scans PRN as urinary catheter was removed  Nephrology onboard. Still on CRRT with removal of 50 cc/hr. Plan to transition to intermittent HD once HR stable.    PULMONARY A: Obstructive Sleep Apnea  On CPAP at home   P:   CPAP per RT  GASTROINTESTINAL A: Elevated LFTs ? Cholestatic liver injury  Presented with gross elevation in alk phos and direct bilirubin with minor elevation in LFTs and indirect bilirubin.  Abdominal ultrasound unremarkable for acute etiology with no pericholecystic fluid, wall thickening, gallstones, or ductal dilation.  CT abdomen and pelvis yesterday with normal liver, intra-/extrahepatic biliary duct, and gallbladder.  Ammonium elevated to 44, INR 1.65  P:   Will continue to monitor   HEMATOLOGIC A:  Beta Thalassemia  Acute on chronic anemia secondary to sepsis Hgb dropped from baseline of ~10 to 7.6 on admission.  LDH elevated but haptoglobin elevated and reticulocyte WNL therefore, unlikely to be 2/2 hemolytic anemia.   P:  Has been evaluated by hematology, no further recommendations.  Transfusion goals: Transfuse 1 units pRBCs if <7 or <8 if patient becomes hemodynamically unstable and/or has active bleeding   ENDOCRINE A: Insulin Dependent Type 2 DM On Humolin 75 units TID at home   P:   CBG goal 140-180  Continue SSI-moderate   NEUROLOGICAL A: Altered Mental Status  Multiple contributing factors including A-fib, sepsis, opiates ABG on 4/2: 7.43/33.8/42/22 Ammonium 44  CT head without acute abnormalities   P:  Mental status waxes and wanes. Will continue treatment for MSSA bacteremia as outlined above  Continue treatment for A-fib as outlined above  Fentanyl for pain control   Pulmonary and [place] Pager: [phone]  09/09/2017, 5:42 AM

## 2017-09-11 NOTE — Anesthesia Preprocedure Evaluation (Signed)
Anesthesia Evaluation  Patient identified by MRN, date of birth, ID band Patient confused    Reviewed: Allergy & Precautions, H&P , NPO status , Patient's Chart, lab work & pertinent test results  Airway Mallampati: II   Neck ROM: full    Dental   Pulmonary asthma , sleep apnea , former smoker,    breath sounds clear to auscultation       Cardiovascular hypertension, +CHF   Rhythm:irregular Rate:Tachycardia  septic   Neuro/Psych  Headaches,    GI/Hepatic GERD  ,  Endo/Other  diabetes, Type 2Morbid obesity  Renal/GU ARF and DialysisRenal disease     Musculoskeletal  (+) Arthritis ,   Abdominal   Peds  Hematology  (+) anemia ,   Anesthesia Other Findings   Reproductive/Obstetrics                             Anesthesia Physical Anesthesia Plan  ASA: IV  Anesthesia Plan: General   Post-op Pain Management:    Induction: Intravenous  PONV Risk Score and Plan: 3 and Ondansetron and Treatment may vary due to age or medical condition  Airway Management Planned: Oral ETT  Additional Equipment:   Intra-op Plan:   Post-operative Plan: Extubation in OR  Informed Consent: I have reviewed the patients History and Physical, chart, labs and discussed the procedure including the risks, benefits and alternatives for the proposed anesthesia with the patient or authorized representative who has indicated his/her understanding and acceptance.     Plan Discussed with: CRNA, Anesthesiologist and Surgeon  Anesthesia Plan Comments:         Anesthesia Quick Evaluation

## 2017-09-11 NOTE — Progress Notes (Signed)
[facility] Coordinator will follow pt with ID team and hospital teams to support possible home infusion needs at DC pending final DC orders/disposition.  If patient discharges after hours, please call [phone].   Larry Sierras 10/03/2017, 9:13 AM

## 2017-09-11 NOTE — Transfer of Care (Signed)
Immediate Anesthesia Transfer of Care Note  Patient: Jessica Howard  Procedure(s) Performed: IRRIGATION AND DEBRIDEMENT RIGHT GREAT TOE AND BILATERAL WRISTS (Bilateral ) TRANSESOPHAGEAL ECHOCARDIOGRAM (TEE) CARDIOVERSION  Patient Location: ICU  Anesthesia Type:General  Level of Consciousness: Patient remains intubated per anesthesia plan  Airway & Oxygen Therapy: Patient remains intubated per anesthesia plan and Patient placed on Ventilator (see vital sign flow sheet for setting)  Post-op Assessment: Report given to RN and Post -op Vital signs reviewed and stable  Post vital signs: Reviewed and stable  Last Vitals:  Vitals Value Taken Time  BP    Temp    Pulse    Resp    SpO2      Last Pain:  Vitals:   10/02/2017 1141  TempSrc: Axillary  PainSc:       Patients Stated Pain Goal: 0 (18/29/93 7169)  Complications: No apparent anesthesia complications

## 2017-09-11 NOTE — Progress Notes (Signed)
INFECTIOUS DISEASE PROGRESS NOTE  ID: Jessica Howard is a 57 y.o. female with  Principal Problem:   MSSA bacteremia Active Problems:   Thalassemia   Rheumatoid arthritis ([place])   Morbid obesity ([place])   Acute back pain   Essential hypertension   Diabetes ([place])   Dyslipidemia   Sepsis ([place])   Acute kidney injury ([place])   Immunosuppression due to drug therapy   Rheumatoid arthritis(714.0)   Acute pain of left wrist   Great toe pain, right   Hypertension   Hyperlipidemia   Acute hearing loss of left ear   Diabetes mellitus, insulin dependent (IDDM), controlled (HCC)   Beta thalassemia (HCC)   Symptomatic anemia   Elevated liver enzymes   Hyperbilirubinemia   Altered mental status, unspecified   Atrial flutter (HCC)  Subjective: Confused, restless this AM Slept poorly last PM  Abtx:  Anti-infectives (From admission, onward)   Start     Dose/Rate Route Frequency Ordered Stop   09/08/17 1600  DAPTOmycin (CUBICIN) 1,000 mg in sodium chloride 0.9 % IVPB     1,000 mg 240 mL/hr over 30 Minutes Intravenous Every 48 hours 09/07/17 1019     09/07/17 1000  vancomycin (VANCOCIN) 1,500 mg in sodium chloride 0.9 % 500 mL IVPB  Status:  Discontinued     1,500 mg 250 mL/hr over 120 Minutes Intravenous Every 48 hours 08/28/2017 0906 09/06/17 0935   09/06/17 1600  DAPTOmycin (CUBICIN) 984 mg in sodium chloride 0.9 % IVPB  Status:  Discontinued     8 mg/kg  123 kg 239.4 mL/hr over 30 Minutes Intravenous Every 48 hours 09/06/17 1436 09/07/17 1019   09/06/17 1000  aztreonam (AZACTAM) 0.5 g in dextrose 5 % 50 mL IVPB  Status:  Discontinued     0.5 g 100 mL/hr over 30 Minutes Intravenous Every 8 hours 09/06/17 0943 09/06/17 1223   08/20/2017 2000  metroNIDAZOLE (FLAGYL) IVPB 500 mg  Status:  Discontinued     500 mg 100 mL/hr over 60 Minutes Intravenous Every 8 hours 09/04/2017 1343 09/06/17 0028   09/03/2017 0900  aztreonam (AZACTAM) 2 g in sodium chloride 0.9 % 100 mL IVPB  Status:   Discontinued     2 g 200 mL/hr over 30 Minutes Intravenous Every 8 hours 08/29/2017 0853 09/06/17 0028   08/16/2017 0900  metroNIDAZOLE (FLAGYL) IVPB 500 mg     500 mg 100 mL/hr over 60 Minutes Intravenous  Once 08/17/2017 0853 08/26/2017 1035   08/24/2017 0830  piperacillin-tazobactam (ZOSYN) IVPB 3.375 g  Status:  Discontinued     3.375 g 100 mL/hr over 30 Minutes Intravenous  Once 09/04/2017 0816 08/13/2017 0836   08/10/2017 0830  vancomycin (VANCOCIN) IVPB 1000 mg/200 mL premix  Status:  Discontinued     1,000 mg 200 mL/hr over 60 Minutes Intravenous  Once 09/06/2017 0816 08/20/2017 0822   08/21/2017 0830  vancomycin (VANCOCIN) 2,000 mg in sodium chloride 0.9 % 500 mL IVPB     2,000 mg 250 mL/hr over 120 Minutes Intravenous  Once 08/22/2017 4401 08/29/2017 1342      Medications:  Scheduled: . Chlorhexidine Gluconate Cloth  6 each Topical Daily  . insulin aspart  0-15 Units Subcutaneous TID WC  . lactulose  10 g Oral Daily  . mouth rinse  15 mL Mouth Rinse BID  . metoprolol tartrate  10 mg Intravenous Q6H  . sodium chloride flush  3 mL Intravenous Q12H    Objective: Vital signs in last 24 hours: Temp:  [  98 F (36.7 C)-98.6 F (37 C)] 98.6 F (37 C) (04/04 0739) Pulse Rate:  [107-140] 138 (04/04 0800) Resp:  [16-60] 21 (04/04 0800) BP: (82-164)/(62-129) 164/99 (04/04 0800) SpO2:  [95 %-100 %] 95 % (04/04 0800) Weight:  [125 kg (275 lb 9.2 oz)] 125 kg (275 lb 9.2 oz) (04/04 0400)   General appearance: no distress and morbidly obese Neck: R neck central line Resp: clear to auscultation bilaterally Cardio: tachycardia GI: normal findings: bowel sounds normal and soft, non-tender Extremities: R great toe with pustulent lesion covering most of toe.   Lab Results Recent Labs    09/10/17 0434 09/10/17 0436 09/10/17 1806 10/02/2017 0409  WBC 25.3*  --   --  20.3*  HGB 9.2*  --   --  8.0*  HCT 28.0*  --   --  24.5*  NA  --  135 135  --   K  --  3.9 3.5  --   CL  --  98* 101  --   CO2  --   22 22  --   BUN  --  24* 25*  --   CREATININE  --  2.31* 2.32*  --    Liver Panel Recent Labs    09/09/17 1004  09/10/17 1806 09/21/2017 0409  PROT 6.4*  --   --  6.1*  ALBUMIN 1.5*   < > 1.4* 1.4*  AST 98*  --   --  67*  ALT 30  --   --  28  ALKPHOS 541*  --   --  527*  BILITOT 5.4*  --   --  5.5*  BILIDIR 3.7*  --   --  3.5*  IBILI 1.7*  --   --  2.0*   < > = values in this interval not displayed.   Sedimentation Rate No results for input(s): ESRSEDRATE in the last 72 hours. C-Reactive Protein No results for input(s): CRP in the last 72 hours.  Microbiology: Recent Results (from the past 240 hour(s))  Blood Culture (routine x 2)     Status: Abnormal   Collection Time: 09/07/2017  9:50 AM  Result Value Ref Range Status   Specimen Description BLOOD RIGHT HAND  Final   Special Requests   Final    BOTTLES DRAWN AEROBIC AND ANAEROBIC Blood Culture adequate volume   Culture  Setup Time   Final    GRAM POSITIVE COCCI IN BOTH AEROBIC AND ANAEROBIC BOTTLES CRITICAL RESULT CALLED TO, READ BACK BY AND VERIFIED WITH: G ABBOTT 09/06/17 0006 JDW    Culture (A)  Final    STAPHYLOCOCCUS AUREUS SUSCEPTIBILITIES PERFORMED ON PREVIOUS CULTURE WITHIN THE LAST 5 DAYS. Performed at [facility], [place] [address] [place], [place] [address]    Report Status 09/08/2017 FINAL  Final  Blood Culture (routine x 2)     Status: Abnormal   Collection Time: 08/29/2017  1:54 PM  Result Value Ref Range Status   Specimen Description BLOOD RIGHT FOREARM  Final   Special Requests   Final    BOTTLES DRAWN AEROBIC AND ANAEROBIC Blood Culture adequate volume   Culture  Setup Time   Final    GRAM POSITIVE COCCI IN BOTH AEROBIC AND ANAEROBIC BOTTLES CRITICAL RESULT CALLED TO, READ BACK BY AND VERIFIED WITH: Denton Brick PHARMD 09/06/17 0006 JDW Performed at [facility], [place] [address] [place], [place] [address]    Culture STAPHYLOCOCCUS AUREUS (A)  Final   Report Status 09/08/2017 FINAL  Final    Organism ID, Bacteria STAPHYLOCOCCUS AUREUS  Final      Susceptibility   Staphylococcus aureus - MIC*    CIPROFLOXACIN <=0.5 SENSITIVE Sensitive     ERYTHROMYCIN <=0.25 SENSITIVE Sensitive     GENTAMICIN <=0.5 SENSITIVE Sensitive     OXACILLIN 0.5 SENSITIVE Sensitive     TETRACYCLINE <=1 SENSITIVE Sensitive     VANCOMYCIN 1 SENSITIVE Sensitive     TRIMETH/SULFA <=10 SENSITIVE Sensitive     CLINDAMYCIN <=0.25 SENSITIVE Sensitive     RIFAMPIN <=0.5 SENSITIVE Sensitive     Inducible Clindamycin NEGATIVE Sensitive     * STAPHYLOCOCCUS AUREUS  Blood Culture ID Panel (Reflexed)     Status: Abnormal   Collection Time: 08/25/2017  1:54 PM  Result Value Ref Range Status   Enterococcus species NOT DETECTED NOT DETECTED Final   Listeria monocytogenes NOT DETECTED NOT DETECTED Final   Staphylococcus species DETECTED (A) NOT DETECTED Final    Comment: CRITICAL RESULT CALLED TO, READ BACK BY AND VERIFIED WITH: G ABBOTT PHARMD 09/06/17 0006 JDW    Staphylococcus aureus DETECTED (A) NOT DETECTED Final    Comment: Methicillin (oxacillin) susceptible Staphylococcus aureus (MSSA). Preferred therapy is anti staphylococcal beta lactam antibiotic (Cefazolin or Nafcillin), unless clinically contraindicated. CRITICAL RESULT CALLED TO, READ BACK BY AND VERIFIED WITH: G ABBOTT PHARMD 09/06/17 0006 JDW    Methicillin resistance NOT DETECTED NOT DETECTED Final   Streptococcus species NOT DETECTED NOT DETECTED Final   Streptococcus agalactiae NOT DETECTED NOT DETECTED Final   Streptococcus pneumoniae NOT DETECTED NOT DETECTED Final   Streptococcus pyogenes NOT DETECTED NOT DETECTED Final   Acinetobacter baumannii NOT DETECTED NOT DETECTED Final   Enterobacteriaceae species NOT DETECTED NOT DETECTED Final   Enterobacter cloacae complex NOT DETECTED NOT DETECTED Final   Escherichia coli NOT DETECTED NOT DETECTED Final   Klebsiella oxytoca NOT DETECTED NOT DETECTED Final   Klebsiella pneumoniae NOT DETECTED NOT  DETECTED Final   Proteus species NOT DETECTED NOT DETECTED Final   Serratia marcescens NOT DETECTED NOT DETECTED Final   Haemophilus influenzae NOT DETECTED NOT DETECTED Final   Neisseria meningitidis NOT DETECTED NOT DETECTED Final   Pseudomonas aeruginosa NOT DETECTED NOT DETECTED Final   Candida albicans NOT DETECTED NOT DETECTED Final   Candida glabrata NOT DETECTED NOT DETECTED Final   Candida krusei NOT DETECTED NOT DETECTED Final   Candida parapsilosis NOT DETECTED NOT DETECTED Final   Candida tropicalis NOT DETECTED NOT DETECTED Final    Comment: Performed at [facility], [place] [address] [place], [place] [address]  Urine culture     Status: Abnormal   Collection Time: 08/23/2017  3:56 PM  Result Value Ref Range Status   Specimen Description URINE, RANDOM  Final   Special Requests   Final    NONE Performed at [facility], [place] [address], [place], [place] [address]    Culture 50,000 COLONIES/mL STAPHYLOCOCCUS AUREUS (A)  Final   Report Status 09/07/2017 FINAL  Final   Organism ID, Bacteria STAPHYLOCOCCUS AUREUS (A)  Final      Susceptibility   Staphylococcus aureus - MIC*    CIPROFLOXACIN <=0.5 SENSITIVE Sensitive     GENTAMICIN <=0.5 SENSITIVE Sensitive     NITROFURANTOIN 32 SENSITIVE Sensitive     OXACILLIN 0.5 SENSITIVE Sensitive     TETRACYCLINE <=1 SENSITIVE Sensitive     VANCOMYCIN 1 SENSITIVE Sensitive     TRIMETH/SULFA <=10 SENSITIVE Sensitive     CLINDAMYCIN <=0.25 SENSITIVE Sensitive  RIFAMPIN <=0.5 SENSITIVE Sensitive     Inducible Clindamycin NEGATIVE Sensitive     * 50,000 COLONIES/mL STAPHYLOCOCCUS AUREUS  MRSA PCR Screening     Status: None   Collection Time: 09/06/17  5:20 AM  Result Value Ref Range Status   MRSA by PCR NEGATIVE NEGATIVE Final    Comment:        The GeneXpert MRSA Assay (FDA approved for NASAL specimens only), is one component of a comprehensive MRSA colonization surveillance program. It is not intended to diagnose  MRSA infection nor to guide or monitor treatment for MRSA infections. Performed at [facility], [place] [address], [place], [place] [address]   Culture, blood (routine x 2)     Status: Abnormal   Collection Time: 09/07/17  7:09 AM  Result Value Ref Range Status   Specimen Description BLOOD RIGHT HAND  Final   Special Requests AEROBIC BOTTLE ONLY Blood Culture adequate volume  Final   Culture  Setup Time   Final    GRAM POSITIVE COCCI AEROBIC BOTTLE ONLY CRITICAL VALUE NOTED.  VALUE IS CONSISTENT WITH PREVIOUSLY REPORTED AND CALLED VALUE.    Culture (A)  Final    STAPHYLOCOCCUS AUREUS SUSCEPTIBILITIES PERFORMED ON PREVIOUS CULTURE WITHIN THE LAST 5 DAYS. Performed at [facility], [place] [address] [place], [place] [address]    Report Status 09/09/2017 FINAL  Final  Culture, blood (routine x 2)     Status: None (Preliminary result)   Collection Time: 09/07/17  7:17 AM  Result Value Ref Range Status   Specimen Description BLOOD LEFT ANTECUBITAL  Final   Special Requests   Final    IN BOTH AEROBIC AND ANAEROBIC BOTTLES Blood Culture results may not be optimal due to an excessive volume of blood received in culture bottles   Culture   Final    NO GROWTH 3 DAYS Performed at [facility], [place] [address] [place], [place] [address]    Report Status PENDING  Incomplete  Aerobic Culture (superficial specimen)     Status: None   Collection Time: 09/08/17  3:10 PM  Result Value Ref Range Status   Specimen Description WOUND RIGHT TOE  Final   Special Requests   Final    NONE Performed at [facility], [place] [address] [place], [place] [address]    Gram Stain   Final    RARE WBC PRESENT, PREDOMINANTLY MONONUCLEAR FEW GRAM POSITIVE COCCI ABUNDANT SQUAMOUS EPITHELIAL CELLS PRESENT    Culture ABUNDANT STAPHYLOCOCCUS AUREUS  Final   Report Status 09/10/2017 FINAL  Final   Organism ID, Bacteria STAPHYLOCOCCUS AUREUS  Final      Susceptibility   Staphylococcus  aureus - MIC*    CIPROFLOXACIN <=0.5 SENSITIVE Sensitive     ERYTHROMYCIN <=0.25 SENSITIVE Sensitive     GENTAMICIN <=0.5 SENSITIVE Sensitive     OXACILLIN 0.5 SENSITIVE Sensitive     TETRACYCLINE <=1 SENSITIVE Sensitive     VANCOMYCIN <=0.5 SENSITIVE Sensitive     TRIMETH/SULFA <=10 SENSITIVE Sensitive     CLINDAMYCIN <=0.25 SENSITIVE Sensitive     RIFAMPIN <=0.5 SENSITIVE Sensitive     Inducible Clindamycin NEGATIVE Sensitive     * ABUNDANT STAPHYLOCOCCUS AUREUS  Body fluid culture     Status: None (Preliminary result)   Collection Time: 09/09/17  3:00 PM  Result Value Ref Range Status   Specimen Description SYNOVIAL LEFT WRIST  Final   Special Requests NONE  Final   Gram Stain  Final    ABUNDANT WBC PRESENT, PREDOMINANTLY PMN MODERATE GRAM POSITIVE COCCI    Culture   Final    TOO YOUNG TO READ Performed at [facility], [place] [address], [place], [place] [address]    Report Status PENDING  Incomplete    Studies/Results: Ct Abdomen Pelvis Wo Contrast  Result Date: 09/09/2017 CLINICAL DATA:  Bacteremia EXAM: CT ABDOMEN AND PELVIS WITHOUT CONTRAST TECHNIQUE: Multidetector CT imaging of the abdomen and pelvis was performed following the standard protocol without IV contrast. COMPARISON:  None. FINDINGS: Motion degraded images. Lower chest: Small right pleural effusion. Hepatobiliary: Liver is grossly unremarkable. Gallbladder is grossly unremarkable. No intrahepatic or extrahepatic ductal dilatation. Pancreas: Within normal limits. Spleen: Within normal limits. Adrenals/Urinary Tract: Adrenal glands within normal limits. Kidneys are within normal limits. No renal calculi or hydronephrosis. Bladder is decompressed by an indwelling Foley catheter. Stomach/Bowel: Stomach is within normal limits. No evidence of bowel obstruction. Normal appendix (series 9/image 55). Mild sigmoid diverticulosis, without evidence of diverticulitis. Vascular/Lymphatic: No evidence of abdominal aortic  aneurysm. Atherosclerotic calcifications of the abdominal aorta and branch vessels. No suspicious abdominopelvic lymphadenopathy. Reproductive: Uterus is within normal limits. Bilateral ovaries are within normal limits. Other: No abdominopelvic ascites. Musculoskeletal: Degenerative changes of the lower thoracic spine. IMPRESSION: No CT findings to account for the patient's bacteremia. Small right pleural effusion. Mild sigmoid diverticulosis, without evidence of diverticulitis. Bladder decompressed by indwelling Foley catheter. Electronically Signed   By: Julian Hy M.D.   On: 09/09/2017 15:26   Ct Head Wo Contrast  Result Date: 09/09/2017 CLINICAL DATA:  57 year old female with a history of altered mental status EXAM: CT HEAD WITHOUT CONTRAST TECHNIQUE: Contiguous axial images were obtained from the base of the skull through the vertex without intravenous contrast. COMPARISON:  None. FINDINGS: Brain: No acute intracranial hemorrhage. No midline shift or mass effect. Unremarkable appearance of the ventricles. Gray-white differentiation maintained. Vascular: Calcifications of the anterior circulation Skull: No acute displaced fracture. Sinuses/Orbits: Trace disease within the ethmoid air cells. Maxillary sinuses, frontal sinuses, and sphenoid sinuses patent Other: None IMPRESSION: CT negative for acute intracranial abnormality. Electronically Signed   By: Corrie Mckusick D.O.   On: 09/09/2017 15:23   Mr Lumbar Spine Wo Contrast  Result Date: 09/10/2017 CLINICAL DATA:  Bacteremia. Rheumatoid arthritis on immunosuppressant. Toe and wrist septic arthritis. Back pain. EXAM: MRI LUMBAR SPINE WITHOUT CONTRAST TECHNIQUE: Multiplanar, multisequence MR imaging of the lumbar spine was performed. No intravenous contrast was administered. COMPARISON:  Abdominal CT from yesterday FINDINGS: Segmentation: 5 lumbar type vertebral bodies based on the lowest ribs. Alignment:  Trace degenerative anterolisthesis at L4-5 and  L5-S1. Vertebrae: Negative for discitis or visible osteomyelitis. There is fluid signal around the right L1-2 facet joint measuring up to 23 mm. No underlying joint erosions seen yesterday but there is marrow edema today. There is no noncontrast evidence of epidural collection. Preserved foraminal fat negative for fracture. Conus medullaris and cauda equina: Conus extends to the L1-2 level. Conus and cauda equina appear normal. Paraspinal and other soft tissues: Edematous type signal in the right more than left intrinsic back muscles, superimposed on fatty atrophy. Disc levels: T12- L1: Negative L1-L2: Acute findings at the right facet. L2-L3: Negative L3-L4: Negative L4-L5: Degenerative facet spurring with joint effusion. Trace anterolisthesis. Early endplate degeneration O3-J0:KXFGH arthropathy with spurring and trace anterolisthesis. Mild disc bulging. Mild endplate degeneration. IMPRESSION: 1. Limited by motion and lack of contrast in this patient with acute renal failure. 2. Septic arthritis  at the right L1-2 facet with 2.3 cm posterior collection. No evidence of spinal canal or foraminal abscess. 3. Degenerative changes noted above. Electronically Signed   By: Monte Fantasia M.D.   On: 09/10/2017 17:35   Mr Foot Right Wo Contrast  Result Date: 09/10/2017 CLINICAL DATA:  Great toe infection. History of rheumatoid arthritis. EXAM: MRI OF THE RIGHT FOREFOOT WITHOUT CONTRAST TECHNIQUE: Multiplanar, multisequence MR imaging of the right forefoot was performed. No intravenous contrast was administered. COMPARISON:  Right foot x-rays dated September 05, 2017. FINDINGS: Despite efforts by the technologist and patient, motion artifact is present on today's exam and could not be eliminated. This reduces exam sensitivity and specificity. Bones/Joint/Cartilage Cortical destruction and abnormal marrow edema with decreased T1 marrow signal involving the first proximal and distal phalanges surrounding the first IP joint.  Prominent first IP joint effusion. Mild osteoarthritis of the first MTP joint. There are multiple small erosions involving the navicular, cuneiforms, and metatarsal bases, likely related to patient's history of rheumatoid arthritis. Ligaments The toe collateral ligaments are intact. Muscles and Tendons There is a 4.5 x 3.4 cm multiloculated area of increased T2 signal in the intrinsic muscles of the forefoot at the plantar aspect of the second metatarsal, incompletely evaluated. This appears to correspond with muscle signal on the T1 coronal images. Soft tissues Multiple small fluid collections surrounding the first IP joint. Moderate dorsal forefoot soft tissue swelling. IMPRESSION: 1. Limited study. Septic arthritis of the first IP joint with associated osteomyelitis of the first proximal and distal phalanges. There are multiple small fluid collections surrounding the first IP joint which may represent distention of the joint space and/or small adjacent abscesses. 2. 4.5 x 3.4 cm multiloculated area of increased T2 signal in the intrinsic muscles of the forefoot at the plantar aspect of the second metatarsal, incompletely evaluated. This appears to correspond with muscle signal on the T1 coronal images and may reflect myositis or diabetic muscle changes. Electronically Signed   By: Titus Dubin M.D.   On: 09/10/2017 17:59   Dg Chest Port 1 View  Result Date: 09/09/2017 CLINICAL DATA:  Altered mental status EXAM: PORTABLE CHEST 1 VIEW COMPARISON:  09/07/2017 FINDINGS: Right jugular dialysis catheter in the right atrium unchanged. Cardiac enlargement with vascular congestion unchanged. Negative for edema. Small right effusion. Mild right lower lobe airspace disease with mild progression, and may be atelectasis or infiltrate. IMPRESSION: Pulmonary vascular congestion unchanged Mild progression of right lower lobe airspace disease Electronically Signed   By: Franchot Gallo M.D.   On: 09/09/2017 11:35   Dg  Fluoro Guided Needle Plc Aspiration/injection Loc  Result Date: 09/09/2017 CLINICAL DATA:  Staph aureus bacteremia. Left wrist soft tissue swelling EXAM: Left WRIST  INJECTION UNDER FLUOROSCOPY FLUOROSCOPY TIME:  Fluoroscopy Time:  0 minutes 48 seconds Radiation Exposure Index (if provided by the fluoroscopic device): Number of Acquired Spot Images: 0 PROCEDURE: Diffuse soft tissue swelling around the wrist. Overlying skin prepped with Betadine, draped in the usual sterile fashion, and infiltrated locally with buffered Lidocaine. 21 gauge spinal needle advanced into the radiocarpal joint without difficulty. 0.5 mL of normal appearing joint fluid was aspirated and sent to the laboratory. Additional repositioning of the needle did not yield any further fluid. 1 mL of Isovue contrast injected into the needle confirming intra-articular location. IMPRESSION: Successful left wrist arthrocentesis. No significant effusion. 0.5 mL of normal appearing joint fluid sent to the laboratory for analysis. Electronically Signed   By: Franchot Gallo M.D.  On: 09/09/2017 15:08     Assessment/Plan: MSSA bacteremia ARF on CVVHD Septic arthritis L wrist L1-2 septic arthritis, abscess R toe infection Shock liver? RA on abatacept, levunomide DM2  Total days of antibiotics: 6 (dapto)  Afberile, WBC slightly better (down to 20) Repeat BCx 3-31 + 1/2 Will repeat BCx today HD line placed during bacteremia. Will need removed at some point.  TEE today I & D of infected joints today Continue dapto Follow CK to watch for drug induced rhabdo Watch MSE, sundowning? FSG fairly well controlled.           Bobby Rumpf MD, FACP Infectious Diseases (pager) [phone] [email or web address] 09/14/2017, 8:46 AM  LOS: 6 days

## 2017-09-11 NOTE — Progress Notes (Addendum)
Patient remains confused, agitated, wounds.  On exam, her right upper extremity has significant soft tissue swelling around the thumb, not so much around the wrist or dorsal hand.  The left upper extremity still has significant soft tissue swelling around the dorsum of the wrist and hand.  Right lower extremity also has purulence and soft tissue abnormality around the great toe.  Her imaging studies confirm septic joint of the right IP joint of the great toe with some associated osteomyelitis, and her lumbar spine MRI demonstrates evidence for sepsis of some of the facet joints.  We are going to plan for surgical irrigation and debridement of the right great toe, the right hand and thumb, and the left septic wrist, and I will also contact neurosurgery, I am not aware that they have gotten involved in her care yet, but she needs assessment for the septic posterior facet joints at L1-L2.  I have spoken with Dr. Sherwood Gambler who recommends percutaneous aspiration, rather than open I&D.  I discussed the risks benefits and alternatives with the family, who is at the bedside, and they understand and we will proceed.  There is certainly high risk that she will need future surgical debridements, even potential toe amputation, but today we will begin with irrigation and debridement.

## 2017-09-11 NOTE — Progress Notes (Signed)
Order that [facility] ports may be used for IV access.  10cc blood wasted from red port and propofol gtt started.

## 2017-09-11 NOTE — CV Procedure (Signed)
TEE/CARDIOVERSION NOTE  TRANSESOPHAGEAL ECHOCARDIOGRAM (TEE):  Indictation: Atrial Flutter  Consent:   Informed consent was obtained prior to the procedure. The risks, benefits and alternatives for the procedure were discussed and the patient comprehended these risks.  Risks include, but are not limited to, cough, sore throat, vomiting, nausea, somnolence, esophageal and stomach trauma or perforation, bleeding, low blood pressure, aspiration, pneumonia, infection, trauma to the teeth and death.    Time Out: Verified patient identification, verified procedure, site/side was marked, verified correct patient position, special equipment/implants available, medications/allergies/relevent history reviewed, required imaging and test results available. Performed  Procedure:  After a procedural time-out, the patient was induced by anesthesia with general sedation. The patient's heart rate, blood pressure, and oxygen saturation are monitored continuously during the procedure. The transesophageal probe was inserted in the esophagus and stomach without difficulty and multiple views were obtained. Agitated microbubble saline contrast was administered.  Complications:    Complications: None Patient did tolerate procedure well.  Findings:  1. LEFT VENTRICLE: The left ventricular wall thickness is moderately increased.  The left ventricular cavity is normal in size. Wall motion is normal.  LVEF is 60-65%.  2. RIGHT VENTRICLE:  The right ventricle is normal in structure and function without any thrombus or masses.    3. LEFT ATRIUM:  The left atrium is mildly dilated in size without any thrombus or masses.  There is spontaneous echo contrast ("smoke") in the left atrium consistent with a low flow state.  4. LEFT ATRIAL APPENDAGE:  The left atrial appendage is free of any thrombus or masses. The appendage has single lobes. Pulse doppler indicates moderate flow in the appendage.  5. ATRIAL SEPTUM:   The atrial septum is aneursymal.  There is no evidence for interatrial shunting by color doppler and saline microbubble.  6. RIGHT ATRIUM:  The right atrium is mildly dilated without any thrombus or masses.  7. MITRAL VALVE:  The mitral valve is thickened and endocarditis cannot be excluded. There is trace to mild regurgitation.  There was no stenosis.  8. AORTIC VALVE:  The aortic valve is trileaflet, normal in structure and function with no signficant regurgitation.  There were no vegetations or stenosis  9. TRICUSPID VALVE:  The tricuspid valve is normal in structure and function with mild to moderate regurgitation.  There were no vegetations or stenosis  10.  PULMONIC VALVE:  The pulmonic valve is normal in structure and function with no regurgitation.  There were no vegetations or stenosis.   11. AORTIC ARCH, ASCENDING AND DESCENDING AORTA:  There was no Ron Parker et. Al, 1992) atherosclerosis of the ascending aorta, aortic arch, or proximal descending aorta.  12. PULMONARY VEINS: Anomalous pulmonary venous return was not noted.  13. PERICARDIUM: The pericardium appeared normal and non-thickened.  There is no pericardial effusion.  CARDIOVERSION:     Second Time Out: Verified patient identification, verified procedure, site/side was marked, verified correct patient position, special equipment/implants available, medications/allergies/relevent history reviewed, required imaging and test results available.  Performed  Procedure:  1. Patient placed on cardiac monitor, pulse oximetry, supplemental oxygen as necessary.  2. Sedation administered per anesthesia 3. Pacer pads placed anterior and posterior chest. 4. Cardioverted 1 time(s).  5. Cardioverted at 150J biphasic.  Complications:  Complications: None Patient did tolerate procedure well.  Impression:  1. No LAA thrombus, atrial flutter was confirmed 2. Mildly dilated LA with smoke 3. Aneurysmal interatrial septum without PFO  by saline microbubble contrast and color doppler 4. Moderate  LVH 5. Mild to moderate TR, RVSP 50 mmHg + RAP 6. Thickening of the mitral valve leaflets, but no large vegetation - endocarditis cannot be excluded 7. LVEF 60-65% 8. Successful DCCV to NSR with a single 150J biphasic shock  Recommendations:  1. Obtain post-op EKG in PACU. 2. Restart heparin as soon as practical (today)- from an operative standpoint - this was discussed with Dr. Mardelle Matte and considered acceptable to do 3. Ok to d/c diltiazem 4. Continue IV amiodarone  Time Spent Directly with the Patient:  60 minutes   Pixie Casino, MD, [facility], [place] Director of the [facility] Diplomate of the American Board of Clinical Lipidology Attending Cardiologist  Direct Dial: [phone]  Fax: [phone]  Website:  [email or web address] 09/29/2017, 3:09 PM

## 2017-09-11 NOTE — Progress Notes (Signed)
  Echocardiogram Echocardiogram Transesophageal has been performed.  Jessica Howard 10/05/2017, 3:37 PM

## 2017-09-11 NOTE — Consult Note (Signed)
Reason for Consult:  Right L1-2 facet septic joint with small dorsal abscess Referring Physician:  Ina Homes M.D., (medical resident for the critical care medicine service)  Jessica Howard is an 57 y.o. female.  HPI:  Chart reviewed. Patient currently in the operating room for orthopedic debridement of septic left wrist and right great toe, as well as TEE by cardiology. Called by Dr. Tarri Abernethy, at about noon, because of infected right L1-2 facet joint with small dorsal abscess. History notable for rheumatoid arthritis with immunosuppression, admitted 6 days ago with bacteremia due to MSSA, and sepsis with a ARI. Also A. Fib.  Patient is a established patient of my partner Dr. Kary Kos who asked that I handle this consultation.  Past Medical History:  Past Medical History:  Diagnosis Date  . Asthma   . Beta thalassemia w/chronic anemia    Thalassemia  . Diabetes mellitus type 2, insulin dependent (HCC)    type 2  . Diabetic neuropathy ([place])   . Endometrial polyp   . Fibroadenoma of breast    left  . GERD (gastroesophageal reflux disease)   . Headache    migraines  . Hearing loss, right   . Hepatic steatosis   . Hyperlipidemia   . Hypertension   . Immunocompromised state due to drug therapy   . Impingement syndrome of right shoulder   . Nontraumatic tear of right rotator cuff   . Pneumonia   . Psoriasis   . Rheumatoid arthritis(714.0)    rhematoid and osteoarthritis  . Sleep apnea   . Thyroid disease    had radiation    Past Surgical History:  Past Surgical History:  Procedure Laterality Date  . ANKLE ARTHROSCOPY Right   . CESAREAN SECTION    . COLONOSCOPY    . DILATION AND CURETTAGE OF UTERUS    . EYE SURGERY     surgery on retina  . HAND SURGERY     for arthritis  . left knee surgery nodule removal    . OTHER SURGICAL HISTORY     multiple nodule removal on neck, hands, left knee  . SHOULDER ARTHROSCOPY WITH ROTATOR CUFF REPAIR AND SUBACROMIAL DECOMPRESSION  Right 05/20/2014   Procedure: RIGHT SHOULDER ARTHROSCOPY WITH DEBRIDEMENT/DISTAL CLAVICLE EXCISION/ROTATOR CUFF REPAIR AND SUBACROMIAL DECOMPRESSION;  Surgeon: Lorn Junes, MD;  Location: [facility];  Service: Orthopedics;  Laterality: Right;  . TONSILLECTOMY    . TUBAL LIGATION     bilateral    Family History:  Family History  Problem Relation Age of Onset  . Diabetes Mother   . Cancer Mother        breast  . Aneurysm Father        brain  . Diabetes Sister   . Cancer Brother        bone marrow cancer    Social History:  reports that she quit smoking about 4 years ago. Her smoking use included cigarettes. She has a 1.50 pack-year smoking history. She has never used smokeless tobacco. She reports that she does not drink alcohol or use drugs.  Allergies:  Allergies  Allergen Reactions  . Cefuroxime Axetil Other (See Comments)    Blistering wounds per family  . Cephalosporins Other (See Comments)    Blistering wounds as an adult per family  . Bactrim [Sulfamethoxazole-Trimethoprim] Hives  . Iohexol Itching and Rash     Code: RASH, Desc: HAD ITCHING AND A RASH ABOUT ONE HOUR AFTER RETURNING HOME FROM THE CT, Onset Date: [id number]   .  Lisinopril Cough  . Penicillins Hives  . Sulfa Antibiotics Hives    Medications: I have reviewed the patient's current medications.  ROS:  Not available due to the patient being under anesthesia in the operating room.  Physical Examination:  Blood pressure (!) 125/112, pulse (!) 112, temperature 98 F (36.7 C), temperature source Axillary, resp. rate (!) 22, height 5' 2"  (1.575 m), weight 124.7 kg (275 lb), last menstrual period 12/27/2013, SpO2 100 %.   Results for orders placed or performed during the hospital encounter of 08/12/2017 (from the past 48 hour(s))  Body fluid culture     Status: None (Preliminary result)   Collection Time: 09/09/17  3:00 PM  Result Value Ref Range   Specimen Description SYNOVIAL LEFT WRIST    Special Requests  NONE    Gram Stain      ABUNDANT WBC PRESENT, PREDOMINANTLY PMN MODERATE GRAM POSITIVE COCCI    Culture      FEW STAPHYLOCOCCUS AUREUS SUSCEPTIBILITIES TO FOLLOW Performed at [facility], [place] [address], [place], [place] [address]    Report Status PENDING   Glucose, capillary     Status: Abnormal   Collection Time: 09/09/17  3:23 PM  Result Value Ref Range   Glucose-Capillary 135 (H) 65 - 99 mg/dL   Comment 1 Capillary Specimen    Comment 2 Notify RN   Renal function panel (daily at 1600)     Status: Abnormal   Collection Time: 09/09/17  4:00 PM  Result Value Ref Range   Sodium 136 135 - 145 mmol/L   Potassium 3.7 3.5 - 5.1 mmol/L   Chloride 100 (L) 101 - 111 mmol/L   CO2 22 22 - 32 mmol/L   Glucose, Bld 144 (H) 65 - 99 mg/dL   BUN 31 (H) 6 - 20 mg/dL   Creatinine, Ser 2.47 (H) 0.44 - 1.00 mg/dL   Calcium 7.9 (L) 8.9 - 10.3 mg/dL   Phosphorus 4.3 2.5 - 4.6 mg/dL   Albumin 1.4 (L) 3.5 - 5.0 g/dL   GFR calc non Af Amer 21 (L) >60 mL/min   GFR calc Af Amer 24 (L) >60 mL/min    Comment: (NOTE) The eGFR has been calculated using the CKD EPI equation. This calculation has not been validated in all clinical situations. eGFR's persistently <60 mL/min signify possible Chronic Kidney Disease.    Anion gap 14 5 - 15    Comment: Performed at [facility] [address] [place], [place] [address]  Glucose, capillary     Status: Abnormal   Collection Time: 09/09/17  8:27 PM  Result Value Ref Range   Glucose-Capillary 136 (H) 65 - 99 mg/dL   Comment 1 Notify RN   Glucose, capillary     Status: Abnormal   Collection Time: 09/09/17 11:24 PM  Result Value Ref Range   Glucose-Capillary 145 (H) 65 - 99 mg/dL   Comment 1 Notify RN   Glucose, capillary     Status: Abnormal   Collection Time: 09/10/17  3:34 AM  Result Value Ref Range   Glucose-Capillary 182 (H) 65 - 99 mg/dL   Comment 1 Notify RN   Magnesium     Status: Abnormal   Collection Time: 09/10/17  4:34 AM   Result Value Ref Range   Magnesium 2.7 (H) 1.7 - 2.4 mg/dL    Comment: Performed at [facility] [address] [place], [place] [address]  CK     Status: None  Collection Time: 09/10/17  4:34 AM  Result Value Ref Range   Total CK 131 38 - 234 U/L    Comment: Performed at [facility], [place] [address], [place], [place] [address]  CBC     Status: Abnormal   Collection Time: 09/10/17  4:34 AM  Result Value Ref Range   WBC 25.3 (H) 4.0 - 10.5 K/uL   RBC 4.28 3.87 - 5.11 MIL/uL   Hemoglobin 9.2 (L) 12.0 - 15.0 g/dL   HCT 28.0 (L) 36.0 - 46.0 %   MCV 65.4 (L) 78.0 - 100.0 fL   MCH 21.5 (L) 26.0 - 34.0 pg   MCHC 32.9 30.0 - 36.0 g/dL   RDW 23.4 (H) 11.5 - 15.5 %   Platelets 352 150 - 400 K/uL    Comment: Performed at [facility], [place] [address] [place], [place] [address]  Renal function panel (daily at 0500)     Status: Abnormal   Collection Time: 09/10/17  4:36 AM  Result Value Ref Range   Sodium 135 135 - 145 mmol/L   Potassium 3.9 3.5 - 5.1 mmol/L   Chloride 98 (L) 101 - 111 mmol/L   CO2 22 22 - 32 mmol/L   Glucose, Bld 187 (H) 65 - 99 mg/dL   BUN 24 (H) 6 - 20 mg/dL   Creatinine, Ser 2.31 (H) 0.44 - 1.00 mg/dL   Calcium 8.4 (L) 8.9 - 10.3 mg/dL   Phosphorus 4.1 2.5 - 4.6 mg/dL   Albumin 1.5 (L) 3.5 - 5.0 g/dL   GFR calc non Af Amer 23 (L) >60 mL/min   GFR calc Af Amer 26 (L) >60 mL/min    Comment: (NOTE) The eGFR has been calculated using the CKD EPI equation. This calculation has not been validated in all clinical situations. eGFR's persistently <60 mL/min signify possible Chronic Kidney Disease.    Anion gap 15 5 - 15    Comment: Performed at [facility] [address] [place], [place] [address]  Glucose, capillary     Status: Abnormal   Collection Time: 09/10/17  7:25 AM  Result Value Ref Range   Glucose-Capillary 164 (H) 65 - 99 mg/dL   Comment 1 Capillary Specimen    Comment 2 Notify RN   Glucose, capillary     Status: Abnormal    Collection Time: 09/10/17 11:39 AM  Result Value Ref Range   Glucose-Capillary 173 (H) 65 - 99 mg/dL   Comment 1 Capillary Specimen    Comment 2 Notify RN   Renal function panel (daily at 1600)     Status: Abnormal   Collection Time: 09/10/17  6:06 PM  Result Value Ref Range   Sodium 135 135 - 145 mmol/L   Potassium 3.5 3.5 - 5.1 mmol/L   Chloride 101 101 - 111 mmol/L   CO2 22 22 - 32 mmol/L   Glucose, Bld 215 (H) 65 - 99 mg/dL   BUN 25 (H) 6 - 20 mg/dL   Creatinine, Ser 2.32 (H) 0.44 - 1.00 mg/dL   Calcium 8.1 (L) 8.9 - 10.3 mg/dL   Phosphorus 4.3 2.5 - 4.6 mg/dL   Albumin 1.4 (L) 3.5 - 5.0 g/dL   GFR calc non Af Amer 23 (L) >60 mL/min   GFR calc Af Amer 26 (L) >60 mL/min    Comment: (NOTE) The eGFR has been calculated using the CKD EPI equation. This calculation has not been validated in all clinical situations. eGFR's persistently <60  mL/min signify possible Chronic Kidney Disease.    Anion gap 12 5 - 15    Comment: Performed at [facility] [address] [place], [place] [address]  Glucose, capillary     Status: Abnormal   Collection Time: 09/10/17  6:11 PM  Result Value Ref Range   Glucose-Capillary 204 (H) 65 - 99 mg/dL   Comment 1 Venous Specimen   Type and screen [place]     Status: None (Preliminary result)   Collection Time: 09/10/17  6:40 PM  Result Value Ref Range   ABO/RH(D) A POS    Antibody Screen NEG    Sample Expiration 09/13/2017    PT AG Type NEGATIVE FOR S ANTIGEN    Unit Number [id number]    Blood Component Type RED CELLS,LR    Unit division 00    Status of Unit ALLOCATED    Donor AG Type NEGATIVE FOR S ANTIGEN    Transfusion Status OK TO TRANSFUSE    Crossmatch Result COMPATIBLE    Unit Number [id number]    Blood Component Type RED CELLS,LR    Unit division 00    Status of Unit ALLOCATED    Donor AG Type NEGATIVE FOR S ANTIGEN    Transfusion Status OK TO TRANSFUSE    Crossmatch Result COMPATIBLE   Heparin  level (unfractionated)     Status: Abnormal   Collection Time: 09/10/17  6:41 PM  Result Value Ref Range   Heparin Unfractionated 0.19 (L) 0.30 - 0.70 IU/mL    Comment:        IF HEPARIN RESULTS ARE BELOW EXPECTED VALUES, AND PATIENT DOSAGE HAS BEEN CONFIRMED, SUGGEST FOLLOW UP TESTING OF ANTITHROMBIN III LEVELS. Performed at [facility], [place] [address] [place], [place] [address]   Glucose, capillary     Status: Abnormal   Collection Time: 09/10/17  7:30 PM  Result Value Ref Range   Glucose-Capillary 177 (H) 65 - 99 mg/dL   Comment 1 Capillary Specimen    Comment 2 Notify RN   Glucose, capillary     Status: Abnormal   Collection Time: 09/10/17 11:34 PM  Result Value Ref Range   Glucose-Capillary 155 (H) 65 - 99 mg/dL   Comment 1 Capillary Specimen    Comment 2 Notify RN   Glucose, capillary     Status: Abnormal   Collection Time: 09/24/2017  3:47 AM  Result Value Ref Range   Glucose-Capillary 173 (H) 65 - 99 mg/dL   Comment 1 Capillary Specimen    Comment 2 Notify RN   Magnesium     Status: Abnormal   Collection Time: 09/15/2017  4:09 AM  Result Value Ref Range   Magnesium 2.6 (H) 1.7 - 2.4 mg/dL    Comment: Performed at [facility], [place] [address], [place], [place] [address]  CBC     Status: Abnormal   Collection Time: 09/15/2017  4:09 AM  Result Value Ref Range   WBC 20.3 (H) 4.0 - 10.5 K/uL   RBC 3.76 (L) 3.87 - 5.11 MIL/uL   Hemoglobin 8.0 (L) 12.0 - 15.0 g/dL   HCT 24.5 (L) 36.0 - 46.0 %   MCV 65.2 (L) 78.0 - 100.0 fL   MCH 21.3 (L) 26.0 - 34.0 pg   MCHC 32.7 30.0 - 36.0 g/dL   RDW 23.6 (H) 11.5 - 15.5 %   Platelets 266 150 - 400 K/uL    Comment: REPEATED TO VERIFY PLATELET COUNT CONFIRMED  BY SMEAR Performed at [facility], [place] [address], [place], [place] [address]   Hepatic function panel     Status: Abnormal   Collection Time: 09/15/2017  4:09 AM  Result Value Ref Range   Total Protein 6.1 (L) 6.5 - 8.1 g/dL   Albumin 1.4 (L) 3.5 - 5.0 g/dL    AST 67 (H) 15 - 41 U/L   ALT 28 14 - 54 U/L   Alkaline Phosphatase 527 (H) 38 - 126 U/L   Total Bilirubin 5.5 (H) 0.3 - 1.2 mg/dL   Bilirubin, Direct 3.5 (H) 0.1 - 0.5 mg/dL   Indirect Bilirubin 2.0 (H) 0.3 - 0.9 mg/dL    Comment: Performed at [facility] [address], [place], [place] [address]  Heparin level (unfractionated)     Status: Abnormal   Collection Time: 09/24/2017  4:24 AM  Result Value Ref Range   Heparin Unfractionated <0.10 (L) 0.30 - 0.70 IU/mL    Comment:        IF HEPARIN RESULTS ARE BELOW EXPECTED VALUES, AND PATIENT DOSAGE HAS BEEN CONFIRMED, SUGGEST FOLLOW UP TESTING OF ANTITHROMBIN III LEVELS. Performed at [facility], [place] [address] [place], [place] [address]   Glucose, capillary     Status: Abnormal   Collection Time: 10/06/2017  7:35 AM  Result Value Ref Range   Glucose-Capillary 176 (H) 65 - 99 mg/dL   Comment 1 Capillary Specimen   Glucose, capillary     Status: Abnormal   Collection Time: 10/05/2017 11:28 AM  Result Value Ref Range   Glucose-Capillary 152 (H) 65 - 99 mg/dL   Comment 1 Capillary Specimen     Ct Abdomen Pelvis Wo Contrast  Result Date: 09/09/2017 CLINICAL DATA:  Bacteremia EXAM: CT ABDOMEN AND PELVIS WITHOUT CONTRAST TECHNIQUE: Multidetector CT imaging of the abdomen and pelvis was performed following the standard protocol without IV contrast. COMPARISON:  None. FINDINGS: Motion degraded images. Lower chest: Small right pleural effusion. Hepatobiliary: Liver is grossly unremarkable. Gallbladder is grossly unremarkable. No intrahepatic or extrahepatic ductal dilatation. Pancreas: Within normal limits. Spleen: Within normal limits. Adrenals/Urinary Tract: Adrenal glands within normal limits. Kidneys are within normal limits. No renal calculi or hydronephrosis. Bladder is decompressed by an indwelling Foley catheter. Stomach/Bowel: Stomach is within normal limits. No evidence of bowel obstruction. Normal appendix (series 9/image  55). Mild sigmoid diverticulosis, without evidence of diverticulitis. Vascular/Lymphatic: No evidence of abdominal aortic aneurysm. Atherosclerotic calcifications of the abdominal aorta and branch vessels. No suspicious abdominopelvic lymphadenopathy. Reproductive: Uterus is within normal limits. Bilateral ovaries are within normal limits. Other: No abdominopelvic ascites. Musculoskeletal: Degenerative changes of the lower thoracic spine. IMPRESSION: No CT findings to account for the patient's bacteremia. Small right pleural effusion. Mild sigmoid diverticulosis, without evidence of diverticulitis. Bladder decompressed by indwelling Foley catheter. Electronically Signed   By: Julian Hy M.D.   On: 09/09/2017 15:26   Ct Head Wo Contrast  Result Date: 09/09/2017 CLINICAL DATA:  57 year old female with a history of altered mental status EXAM: CT HEAD WITHOUT CONTRAST TECHNIQUE: Contiguous axial images were obtained from the base of the skull through the vertex without intravenous contrast. COMPARISON:  None. FINDINGS: Brain: No acute intracranial hemorrhage. No midline shift or mass effect. Unremarkable appearance of the ventricles. Gray-white differentiation maintained. Vascular: Calcifications of the anterior circulation Skull: No acute displaced fracture. Sinuses/Orbits: Trace disease within the ethmoid air cells. Maxillary sinuses, frontal sinuses, and sphenoid sinuses patent Other: None IMPRESSION: CT negative for acute  intracranial abnormality. Electronically Signed   By: Corrie Mckusick D.O.   On: 09/09/2017 15:23   Mr Lumbar Spine Wo Contrast  Result Date: 09/10/2017 CLINICAL DATA:  Bacteremia. Rheumatoid arthritis on immunosuppressant. Toe and wrist septic arthritis. Back pain. EXAM: MRI LUMBAR SPINE WITHOUT CONTRAST TECHNIQUE: Multiplanar, multisequence MR imaging of the lumbar spine was performed. No intravenous contrast was administered. COMPARISON:  Abdominal CT from yesterday FINDINGS:  Segmentation: 5 lumbar type vertebral bodies based on the lowest ribs. Alignment:  Trace degenerative anterolisthesis at L4-5 and L5-S1. Vertebrae: Negative for discitis or visible osteomyelitis. There is fluid signal around the right L1-2 facet joint measuring up to 23 mm. No underlying joint erosions seen yesterday but there is marrow edema today. There is no noncontrast evidence of epidural collection. Preserved foraminal fat negative for fracture. Conus medullaris and cauda equina: Conus extends to the L1-2 level. Conus and cauda equina appear normal. Paraspinal and other soft tissues: Edematous type signal in the right more than left intrinsic back muscles, superimposed on fatty atrophy. Disc levels: T12- L1: Negative L1-L2: Acute findings at the right facet. L2-L3: Negative L3-L4: Negative L4-L5: Degenerative facet spurring with joint effusion. Trace anterolisthesis. Early endplate degeneration T7-S1:XBLTJ arthropathy with spurring and trace anterolisthesis. Mild disc bulging. Mild endplate degeneration. IMPRESSION: 1. Limited by motion and lack of contrast in this patient with acute renal failure. 2. Septic arthritis at the right L1-2 facet with 2.3 cm posterior collection. No evidence of spinal canal or foraminal abscess. 3. Degenerative changes noted above. Electronically Signed   By: Monte Fantasia M.D.   On: 09/10/2017 17:35   Mr Foot Right Wo Contrast  Result Date: 09/10/2017 CLINICAL DATA:  Great toe infection. History of rheumatoid arthritis. EXAM: MRI OF THE RIGHT FOREFOOT WITHOUT CONTRAST TECHNIQUE: Multiplanar, multisequence MR imaging of the right forefoot was performed. No intravenous contrast was administered. COMPARISON:  Right foot x-rays dated September 05, 2017. FINDINGS: Despite efforts by the technologist and patient, motion artifact is present on today's exam and could not be eliminated. This reduces exam sensitivity and specificity. Bones/Joint/Cartilage Cortical destruction and abnormal  marrow edema with decreased T1 marrow signal involving the first proximal and distal phalanges surrounding the first IP joint. Prominent first IP joint effusion. Mild osteoarthritis of the first MTP joint. There are multiple small erosions involving the navicular, cuneiforms, and metatarsal bases, likely related to patient's history of rheumatoid arthritis. Ligaments The toe collateral ligaments are intact. Muscles and Tendons There is a 4.5 x 3.4 cm multiloculated area of increased T2 signal in the intrinsic muscles of the forefoot at the plantar aspect of the second metatarsal, incompletely evaluated. This appears to correspond with muscle signal on the T1 coronal images. Soft tissues Multiple small fluid collections surrounding the first IP joint. Moderate dorsal forefoot soft tissue swelling. IMPRESSION: 1. Limited study. Septic arthritis of the first IP joint with associated osteomyelitis of the first proximal and distal phalanges. There are multiple small fluid collections surrounding the first IP joint which may represent distention of the joint space and/or small adjacent abscesses. 2. 4.5 x 3.4 cm multiloculated area of increased T2 signal in the intrinsic muscles of the forefoot at the plantar aspect of the second metatarsal, incompletely evaluated. This appears to correspond with muscle signal on the T1 coronal images and may reflect myositis or diabetic muscle changes. Electronically Signed   By: Titus Dubin M.D.   On: 09/10/2017 17:59   Dg Fluoro Guided Needle Plc Aspiration/injection Loc  Result Date: 09/09/2017  CLINICAL DATA:  Staph aureus bacteremia. Left wrist soft tissue swelling EXAM: Left WRIST  INJECTION UNDER FLUOROSCOPY FLUOROSCOPY TIME:  Fluoroscopy Time:  0 minutes 48 seconds Radiation Exposure Index (if provided by the fluoroscopic device): Number of Acquired Spot Images: 0 PROCEDURE: Diffuse soft tissue swelling around the wrist. Overlying skin prepped with Betadine, draped in the  usual sterile fashion, and infiltrated locally with buffered Lidocaine. 21 gauge spinal needle advanced into the radiocarpal joint without difficulty. 0.5 mL of normal appearing joint fluid was aspirated and sent to the laboratory. Additional repositioning of the needle did not yield any further fluid. 1 mL of Isovue contrast injected into the needle confirming intra-articular location. IMPRESSION: Successful left wrist arthrocentesis. No significant effusion. 0.5 mL of normal appearing joint fluid sent to the laboratory for analysis. Electronically Signed   By: Franchot Gallo M.D.   On: 09/09/2017 15:08     Assessment/Plan: Critically ill patient, currently undergoing surgery, who has been found to have a septic arthritis of the right L1-2 facet joint with a small dorsal collection. There is no extension into the spinal canal or neural foramina and there is no involvement of the neural structures.  I spoke with Dr. Tarri Abernethy shortly after he called our office and recommend percutaneous aspiration of this small abscess dorsal to the right L1-2 facet joint by interventional radiology. The morbidity associated with an open exploration and debridement far outweighs any potential benefit, and percutaneous aspiration by IR is the indicated approach. No further neurosurgical intervention or follow-up is indicated at this time.  Hosie Spangle, MD 09/13/2017, 1:50 PM

## 2017-09-11 NOTE — Progress Notes (Signed)
ANTICOAGULATION CONSULT NOTE - Follow Up Consult  Pharmacy Consult for heparin Indication: atrial fibrillation  Allergies  Allergen Reactions  . Cefuroxime Axetil Other (See Comments)    Blistering wounds per family  . Cephalosporins Other (See Comments)    Blistering wounds as an adult per family  . Bactrim [Sulfamethoxazole-Trimethoprim] Hives  . Iohexol Itching and Rash     Code: RASH, Desc: HAD ITCHING AND A RASH ABOUT ONE HOUR AFTER RETURNING HOME FROM THE CT, Onset Date: [id number]   . Lisinopril Cough  . Penicillins Hives  . Sulfa Antibiotics Hives    Patient Measurements: Height: 5\' 2"  (157.5 cm) Weight: 275 lb (124.7 kg) IBW/kg (Calculated) : 50.1 Heparin Dosing Weight: 81 kg  Vital Signs: Temp: 98 F (36.7 C) (04/04 1141) Temp Source: Axillary (04/04 1141) BP: 125/112 (04/04 1200) Pulse Rate: 112 (04/04 1200)  Labs: Recent Labs    09/09/17 0432 09/09/17 1004 09/09/17 1600 09/10/17 0434 09/10/17 0436 09/10/17 1806 09/10/17 1841 09/22/2017 0409 09/10/2017 0424  HGB 8.6*  --   --  9.2*  --   --   --  8.0*  --   HCT 27.2*  --   --  28.0*  --   --   --  24.5*  --   PLT 347  --   --  352  --   --   --  266  --   LABPROT  --  19.8*  --   --   --   --   --   --   --   INR  --  1.69  --   --   --   --   --   --   --   HEPARINUNFRC  --   --   --   --   --   --  0.19*  --  <0.10*  CREATININE 2.55*  --  2.47*  --  2.31* 2.32*  --   --   --   CKTOTAL  --   --   --  131  --   --   --   --   --     Estimated Creatinine Clearance: 34.6 mL/min (A) (by C-G formula based on SCr of 2.32 mg/dL (H)).    Assessment: 31 yoF with disseminated MSSA bacteremia now with rapid AFib started on IV heparin yesterday. Heparin held for debridement of septic joints in OR today, now to be resumed s/p DCCV per cards (okayed by surgery team). Heparin level low on 1200 units/hr yesterday, will increase infusion rate and check level in the morning.  Goal of Therapy:  Heparin level 0.3-0.7  units/ml Monitor platelets by anticoagulation protocol: Yes   Plan:  -Resume heparin at 1400 units/hr -Check heparin level with am labs -Monitor closely for S/Sx bleeding post/op  Arrie Senate, PharmD, BCPS PGY-2 Cardiology Pharmacy Resident Pager: [phone] 10/07/2017

## 2017-09-11 NOTE — Progress Notes (Signed)
S: Remains confused and agitated  O:BP 116/84   Pulse (!) 116   Temp 98.6 F (37 C) (Oral)   Resp (!) 28   Ht 5\' 2"  (1.575 m)   Wt 125 kg (275 lb 9.2 oz)   LMP 12/27/2013 Comment: irreggular  SpO2 90%   BMI 50.40 kg/m   Intake/Output Summary (Last 24 hours) at 09/27/2017 1059 Last data filed at 10/01/2017 1000 Gross per 24 hour  Intake 1478.05 ml  Output 2130 ml  Net -651.95 ml   Intake/Output: I/O last 3 completed shifts: In: 2166.5 [P.O.:280; I.V.:1866.5; Other:20] Out: 2673 [Urine:62; Other:2611]  Intake/Output this shift:  Total I/O In: 125.1 [I.V.:125.1] Out: 188 [Other:188] Weight change: -1.3 kg (-2 lb 13.9 oz) Gen: ill appearing [id number]:YBWLS Resp: cta Abd: benign Ext: trace edema  Recent Labs  Lab 09/01/2017 0632 08/10/2017 1531 08/26/2017 2001 09/06/17 0310 09/06/17 2349 09/07/17 1649 09/08/17 0543 09/09/17 0432 09/09/17 1004 09/09/17 1600 09/10/17 0436 09/10/17 1806 09/24/2017 0409  NA 132* 136 135 137 139 136 138 136  --  136 135 135  --   K 5.1 5.6* 6.0* 5.6* 5.0 5.2* 4.2 4.0  --  3.7 3.9 3.5  --   CL 97* 103 104 105 108 106 102 99*  --  100* 98* 101  --   CO2 18* 18* 15* 16* 15* 16* 20* 22  --  22 22 22   --   GLUCOSE 337* 298* 275* 190* 139* 91 75 150*  --  144* 187* 215*  --   BUN 69* 69* 69* 74* 83* 84* 55* 32*  --  31* 24* 25*  --   CREATININE 3.36* 3.75* 3.91* 4.34* 5.92* 6.65* 4.28* 2.55*  --  2.47* 2.31* 2.32*  --   ALBUMIN 2.0* 1.8* 2.1* 2.0* 1.7* 1.6* 1.6* 1.5*  1.5* 1.5* 1.4* 1.5* 1.4* 1.4*  CALCIUM 7.6* 7.0* 7.0* 7.1* 6.5* 6.3* 7.0* 8.1*  --  7.9* 8.4* 8.1*  --   PHOS 5.9*  --   --   --   --  8.1* 5.6* 4.7*  --  4.3 4.1 4.3  --   AST 53* 81* 87* 89* 114*  --   --  102* 98*  --   --   --  67*  ALT 24 32 34 32 34  --   --  30 30  --   --   --  28   Liver Function Tests: Recent Labs  Lab 09/09/17 0432 09/09/17 1004  09/10/17 0436 09/10/17 1806 09/28/2017 0409  AST 102* 98*  --   --   --  67*  ALT 30 30  --   --   --  28  ALKPHOS 526* 541*   --   --   --  527*  BILITOT 5.4* 5.4*  --   --   --  5.5*  PROT 6.9 6.4*  --   --   --  6.1*  ALBUMIN 1.5*  1.5* 1.5*   < > 1.5* 1.4* 1.4*   < > = values in this interval not displayed.   Recent Labs  Lab 08/17/2017 0815  LIPASE 28   Recent Labs  Lab 08/14/2017 0956 09/06/17 0310 09/09/17 1004  AMMONIA 38* 101* 44*   CBC: Recent Labs  Lab 08/09/2017 0632  09/06/17 2349 09/08/17 1325 09/09/17 0432 09/10/17 0434 09/12/2017 0409  WBC 25.1*   < > 23.8* 24.1* 23.6* 25.3* 20.3*  NEUTROABS 22.1*  --   --  21.7*  --   --   --  HGB 7.6*   < > 8.3* 8.1* 8.6* 9.2* 8.0*  HCT 23.3*   < > 25.2* 25.7* 27.2* 28.0* 24.5*  MCV 61.0*   < > 65.6* 66.6* 65.9* 65.4* 65.2*  PLT 423*   < > 356 344 347 352 266   < > = values in this interval not displayed.   Cardiac Enzymes: Recent Labs  Lab 08/21/2017 0632 09/06/17 1030 09/10/17 0434  CKTOTAL 55 233 131   CBG: Recent Labs  Lab 09/10/17 1811 09/10/17 1930 09/10/17 2334 10/01/2017 0347 10/04/2017 0735  GLUCAP 204* 177* 155* 173* 176*    Iron Studies: No results for input(s): IRON, TIBC, TRANSFERRIN, FERRITIN in the last 72 hours. Studies/Results: Ct Abdomen Pelvis Wo Contrast  Result Date: 09/09/2017 CLINICAL DATA:  Bacteremia EXAM: CT ABDOMEN AND PELVIS WITHOUT CONTRAST TECHNIQUE: Multidetector CT imaging of the abdomen and pelvis was performed following the standard protocol without IV contrast. COMPARISON:  None. FINDINGS: Motion degraded images. Lower chest: Small right pleural effusion. Hepatobiliary: Liver is grossly unremarkable. Gallbladder is grossly unremarkable. No intrahepatic or extrahepatic ductal dilatation. Pancreas: Within normal limits. Spleen: Within normal limits. Adrenals/Urinary Tract: Adrenal glands within normal limits. Kidneys are within normal limits. No renal calculi or hydronephrosis. Bladder is decompressed by an indwelling Foley catheter. Stomach/Bowel: Stomach is within normal limits. No evidence of bowel  obstruction. Normal appendix (series 9/image 55). Mild sigmoid diverticulosis, without evidence of diverticulitis. Vascular/Lymphatic: No evidence of abdominal aortic aneurysm. Atherosclerotic calcifications of the abdominal aorta and branch vessels. No suspicious abdominopelvic lymphadenopathy. Reproductive: Uterus is within normal limits. Bilateral ovaries are within normal limits. Other: No abdominopelvic ascites. Musculoskeletal: Degenerative changes of the lower thoracic spine. IMPRESSION: No CT findings to account for the patient's bacteremia. Small right pleural effusion. Mild sigmoid diverticulosis, without evidence of diverticulitis. Bladder decompressed by indwelling Foley catheter. Electronically Signed   By: Julian Hy M.D.   On: 09/09/2017 15:26   Ct Head Wo Contrast  Result Date: 09/09/2017 CLINICAL DATA:  57 year old female with a history of altered mental status EXAM: CT HEAD WITHOUT CONTRAST TECHNIQUE: Contiguous axial images were obtained from the base of the skull through the vertex without intravenous contrast. COMPARISON:  None. FINDINGS: Brain: No acute intracranial hemorrhage. No midline shift or mass effect. Unremarkable appearance of the ventricles. Gray-white differentiation maintained. Vascular: Calcifications of the anterior circulation Skull: No acute displaced fracture. Sinuses/Orbits: Trace disease within the ethmoid air cells. Maxillary sinuses, frontal sinuses, and sphenoid sinuses patent Other: None IMPRESSION: CT negative for acute intracranial abnormality. Electronically Signed   By: Corrie Mckusick D.O.   On: 09/09/2017 15:23   Mr Lumbar Spine Wo Contrast  Result Date: 09/10/2017 CLINICAL DATA:  Bacteremia. Rheumatoid arthritis on immunosuppressant. Toe and wrist septic arthritis. Back pain. EXAM: MRI LUMBAR SPINE WITHOUT CONTRAST TECHNIQUE: Multiplanar, multisequence MR imaging of the lumbar spine was performed. No intravenous contrast was administered. COMPARISON:   Abdominal CT from yesterday FINDINGS: Segmentation: 5 lumbar type vertebral bodies based on the lowest ribs. Alignment:  Trace degenerative anterolisthesis at L4-5 and L5-S1. Vertebrae: Negative for discitis or visible osteomyelitis. There is fluid signal around the right L1-2 facet joint measuring up to 23 mm. No underlying joint erosions seen yesterday but there is marrow edema today. There is no noncontrast evidence of epidural collection. Preserved foraminal fat negative for fracture. Conus medullaris and cauda equina: Conus extends to the L1-2 level. Conus and cauda equina appear normal. Paraspinal and other soft tissues: Edematous type signal in the right more than  left intrinsic back muscles, superimposed on fatty atrophy. Disc levels: T12- L1: Negative L1-L2: Acute findings at the right facet. L2-L3: Negative L3-L4: Negative L4-L5: Degenerative facet spurring with joint effusion. Trace anterolisthesis. Early endplate degeneration [id number] arthropathy with spurring and trace anterolisthesis. Mild disc bulging. Mild endplate degeneration. IMPRESSION: 1. Limited by motion and lack of contrast in this patient with acute renal failure. 2. Septic arthritis at the right L1-2 facet with 2.3 cm posterior collection. No evidence of spinal canal or foraminal abscess. 3. Degenerative changes noted above. Electronically Signed   By: Monte Fantasia M.D.   On: 09/10/2017 17:35   Mr Foot Right Wo Contrast  Result Date: 09/10/2017 CLINICAL DATA:  Great toe infection. History of rheumatoid arthritis. EXAM: MRI OF THE RIGHT FOREFOOT WITHOUT CONTRAST TECHNIQUE: Multiplanar, multisequence MR imaging of the right forefoot was performed. No intravenous contrast was administered. COMPARISON:  Right foot x-rays dated September 05, 2017. FINDINGS: Despite efforts by the technologist and patient, motion artifact is present on today's exam and could not be eliminated. This reduces exam sensitivity and specificity.  Bones/Joint/Cartilage Cortical destruction and abnormal marrow edema with decreased T1 marrow signal involving the first proximal and distal phalanges surrounding the first IP joint. Prominent first IP joint effusion. Mild osteoarthritis of the first MTP joint. There are multiple small erosions involving the navicular, cuneiforms, and metatarsal bases, likely related to patient's history of rheumatoid arthritis. Ligaments The toe collateral ligaments are intact. Muscles and Tendons There is a 4.5 x 3.4 cm multiloculated area of increased T2 signal in the intrinsic muscles of the forefoot at the plantar aspect of the second metatarsal, incompletely evaluated. This appears to correspond with muscle signal on the T1 coronal images. Soft tissues Multiple small fluid collections surrounding the first IP joint. Moderate dorsal forefoot soft tissue swelling. IMPRESSION: 1. Limited study. Septic arthritis of the first IP joint with associated osteomyelitis of the first proximal and distal phalanges. There are multiple small fluid collections surrounding the first IP joint which may represent distention of the joint space and/or small adjacent abscesses. 2. 4.5 x 3.4 cm multiloculated area of increased T2 signal in the intrinsic muscles of the forefoot at the plantar aspect of the second metatarsal, incompletely evaluated. This appears to correspond with muscle signal on the T1 coronal images and may reflect myositis or diabetic muscle changes. Electronically Signed   By: Titus Dubin M.D.   On: 09/10/2017 17:59   Dg Chest Port 1 View  Result Date: 09/09/2017 CLINICAL DATA:  Altered mental status EXAM: PORTABLE CHEST 1 VIEW COMPARISON:  09/07/2017 FINDINGS: Right jugular dialysis catheter in the right atrium unchanged. Cardiac enlargement with vascular congestion unchanged. Negative for edema. Small right effusion. Mild right lower lobe airspace disease with mild progression, and may be atelectasis or infiltrate.  IMPRESSION: Pulmonary vascular congestion unchanged Mild progression of right lower lobe airspace disease Electronically Signed   By: Franchot Gallo M.D.   On: 09/09/2017 11:35   Dg Fluoro Guided Needle Plc Aspiration/injection Loc  Result Date: 09/09/2017 CLINICAL DATA:  Staph aureus bacteremia. Left wrist soft tissue swelling EXAM: Left WRIST  INJECTION UNDER FLUOROSCOPY FLUOROSCOPY TIME:  Fluoroscopy Time:  0 minutes 48 seconds Radiation Exposure Index (if provided by the fluoroscopic device): Number of Acquired Spot Images: 0 PROCEDURE: Diffuse soft tissue swelling around the wrist. Overlying skin prepped with Betadine, draped in the usual sterile fashion, and infiltrated locally with buffered Lidocaine. 21 gauge spinal needle advanced into the radiocarpal joint without difficulty. 0.5  mL of normal appearing joint fluid was aspirated and sent to the laboratory. Additional repositioning of the needle did not yield any further fluid. 1 mL of Isovue contrast injected into the needle confirming intra-articular location. IMPRESSION: Successful left wrist arthrocentesis. No significant effusion. 0.5 mL of normal appearing joint fluid sent to the laboratory for analysis. Electronically Signed   By: Franchot Gallo M.D.   On: 09/09/2017 15:08   . Chlorhexidine Gluconate Cloth  6 each Topical Daily  . insulin aspart  0-15 Units Subcutaneous TID WC  . lactulose  10 g Oral Daily  . mouth rinse  15 mL Mouth Rinse BID  . metoprolol tartrate  10 mg Intravenous Q6H  . sodium chloride flush  3 mL Intravenous Q12H    BMET    Component Value Date/Time   NA 135 09/10/2017 1806   NA 142 03/06/2017 0929   K 3.5 09/10/2017 1806   K 4.1 03/06/2017 0929   CL 101 09/10/2017 1806   CL 101 02/13/2012 0850   CO2 22 09/10/2017 1806   CO2 23 03/06/2017 0929   GLUCOSE 215 (H) 09/10/2017 1806   GLUCOSE 129 03/06/2017 0929   GLUCOSE 529 (H) 02/13/2012 0850   BUN 25 (H) 09/10/2017 1806   BUN 17.1 03/06/2017 0929    CREATININE 2.32 (H) 09/10/2017 1806   CREATININE 0.8 03/06/2017 0929   CALCIUM 8.1 (L) 09/10/2017 1806   CALCIUM 9.6 03/06/2017 0929   GFRNONAA 23 (L) 09/10/2017 1806   GFRAA 26 (L) 09/10/2017 1806   CBC    Component Value Date/Time   WBC 20.3 (H) 10/02/2017 0409   RBC 3.76 (L) 09/17/2017 0409   HGB 8.0 (L) 09/12/2017 0409   HGB 9.4 (L) 05/16/2017 0937   HCT 24.5 (L) 10/06/2017 0409   HCT 30.4 (L) 05/16/2017 0937   PLT 266 09/19/2017 0409   PLT 266 05/16/2017 0937   MCV 65.2 (L) 09/20/2017 0409   MCV 64.4 (L) 05/16/2017 0937   MCH 21.3 (L) 09/27/2017 0409   MCHC 32.7 09/08/2017 0409   RDW 23.6 (H) 10/05/2017 0409   RDW 17.4 (H) 05/16/2017 0937   LYMPHSABS 1.2 09/08/2017 1325   LYMPHSABS 3.1 05/16/2017 0937   MONOABS 1.2 (H) 09/08/2017 1325   MONOABS 0.8 05/16/2017 0937   EOSABS 0.0 09/08/2017 1325   EOSABS 0.1 05/16/2017 0937   BASOSABS 0.0 09/08/2017 1325   BASOSABS 0.0 05/16/2017 0937    Assessment/Plan:  1. ARF, oliguric- presumably due to ischemic ATN in setting of sepsis and hypotension, however pt with evidence of disseminated MSSA septicemia with osler's node and splinter hemorrhages. Started on CVVHD 09/07/17 and tolerating it well.minimal urine output and appear bloody. Consider CT of abdomen/pelvis w/o contrast to r/o renal infarcts. Continuewith CVVHD for now given tachycardia and decline in clinical condition. 1. Will plan on stopping CVVHD when she goes to surgery at 2pm today and hopefully can transition to IHD tomorrow. 2. Need to have better rate control prior to transitioning to intermittent HD but hopefully in the next 24 hours. 2. 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 ID following and TTE ordered and was negative for vegetation and normal EF and valvular function. On Daptomycinper ID. Other workup of infected toe underway and ortho following.  Will need TEE to r/o perivalvular abscess and/or  confirm dx of SBE. 3. Tachycardia/A fib with RVR despite amiodarone drip.  Agree with Cardiology consult. 4. Abnormal LFT's- ?shock liver or direct involvement of infection. Liver  US negative.  5. Anemia of acute illness- follow and transfuse prn 6. Vascular access- RIJ tdc placed 09/07/17 7. Rheumatoid arthritis 8. Left wrist pain- was for IR for aspiration for cellcount/diff and crystal analysis. 9. Thalassemia 10. Hypoalbuminemia- due to sepsis 11. Disposition- hopefully will remain stable and can transition to IHD if no improvement in UOP.    Donetta Potts, MD [employer] [phone]

## 2017-09-11 NOTE — Op Note (Signed)
08/08/2017 - 09/22/2017  4:52 PM  PATIENT:  Jessica Howard    PRE-OPERATIVE DIAGNOSIS:    1. Right thumb infection 2. Right index felon infection with necrosis 3. Right great toe infection with osteomyelitis 4. Left wrist infection  POST-OPERATIVE DIAGNOSIS:    1. Right thumb deep space infection on the ulnar side with tracking into the IP joint of the thumb and involving the flexor tendon of the thumb 2. Right index felon infection 3. Right great toe osteomyelitis and soft tissue abscess on the dorsum 4.  Left hand radial and ulnar abscess with involvement of the radiocarpal, midcarpal, and distal radioulnar joint  PROCEDURE:   1. Right thumb Incision, irrigation, debridement right thumb skin, subcutaneous tissue, tendon 2. Right index finger tip incision irrigation and debridement, felon 3. Right great toe incision, irrigation, debridement of skin, subcutaneous tissue, IP joint, and bone 4. Left hand incision and drainage, abscess skin, subcutaneous tissue, tendon, joints, including radial radiocarpal joint, midcarpal joint, ulnar radiocarpal and midcarpal joint, with DRUJ 5. Application of irrigational wound vac, left hand and wrist  SURGEON:  Johnny Bridge, MD  PHYSICIAN ASSISTANT: Joya Gaskins, OPA-C, present and scrubbed throughout the case, critical for completion in a timely fashion, and for retraction, instrumentation, and closure.  ANESTHESIA:   General  PREOPERATIVE INDICATIONS:  Jessica Howard is a  57 y.o. female with a diagnosis of systemic sepsis that seems to have seeded the right upper extremity, right lower extremity, and left upper extremity,   The risks benefits and alternatives were discussed with the patient preoperatively including but not limited to the risks of infection, bleeding, nerve injury, cardiopulmonary complications, the need for revision surgery, among others, and the patient was willing to proceed.  We also discussed the potential need for  revision irrigation and debridement, as well as the potential for amputation of the great toe.  ESTIMATED BLOOD LOSS: 100 mL  OPERATIVE IMPLANTS: Penrose drain for the right thumb, irrigation of wound VAC for the left wrist and hand.  OPERATIVE FINDINGS: There was significant purulence in the right thumb tracking down into the web space and involving the tendon distally.  The right great toe had osteomyelitis with significant soft tissue destruction dorsally and had significant exposed bone tracking down into the IP joint.  The left hand had a amazing amount of infection, found tracking into the radiocarpal joint on the radial side, the mid carpal joint, and this also tracked over to the ulnar side with subcutaneous abscess formation and infection going down into the joint on the midcarpal and radiocarpal joints from the ulnar side.  This was also tracking deep approximately into the distal forearm at the distal radial ulnar joint.   OPERATIVE PROCEDURE: The patient was brought to the operating room and placed in supine position.  General anesthesia was administered.  She was already on IV antibiotics.  She underwent cardioversion, as well as transesophageal echocardiogram.  The right upper extremity and right lower extremity was then prepped and draped in usual sterile fashion.  Timeout was performed.  I made an incision over the area of maximal purulence on the thumb, it was actually on the ulnar border, and tracked down almost to the level of the metacarpal phalangeal joint.  This was exposed using blunt dissection trying to take care to protect any of the digital nerves.  There was some of the tendon at the distal insertion that was frayed and involved in the infection.  I exposed the deep  tissue, including the IP joint although I did not make an arthrotomy, but there did appear that there was infection tracking down the back, this was irrigated copiously with 6 L of fluid after debridement was  carried out using a scalpel, scissors, a Cobb elevator, and a rongeur.  After complete irrigation and debridement was carried out, and we repaired the skin with loose closure using nylon sutures as well as a Penrose drain.  I made an incision over the necrotic area of the index finger tip at the palpable fluid collection, this was open, exposed, and debrided, and necrotic skin removed.  This was irrigated copiously, and did not require closure.  This is then dressed with sterile gauze, then went to the great toe.  Incision was made over the dorsum, extending and exposing the infection, this tracked down into the area of the proximal phalanx, and the bone was poor quality.  The joint was also exposed.  The infection was debrided using a scalpel, a scissors, as well as a rongure and a gauze.  After complete debridement was carried out I irrigated the wounds with a total of 3 L of saline, and then dressed this with nonstick gauze.  This infection appeared to destroy a lot of the soft tissue and involve the bone and I think that she would be best served with a great toe amputation, although this will take some more discussion with the family.  I did place a needle into the area of the forefoot myositis, attempting to aspirate to see if there was significant purulence, I did not get any purulence or fluid, but I did get the expression of clear serous fluid consistent with myositis from the midfoot,  I then performed a separate prep and drape of the left upper extremity, another timeout performed.  I made a radial incision along the area of maximal soft tissue swelling, and the palpable soft tissue accumulation.  Blunt dissection was carried out down to the level of the extensor tendons, and then a arthrotomy was made at the radiocarpal joint and extended to the mid carpal joint.  Substantial purulence was expressed from both locations.  I irrigated this copiously, and then evaluated the ulnar side.  There was a  soft tissue abscess formation over the ulnar side as well just distal to the ulna, and I made a longitudinal incision in that location trying to maintain an appropriate skin bridge.  The radiocarpal joint and midcarpal joint were also exposed from the ulnar side, and expressed even more purulence.  I irrigated this copiously with saline.  I was still getting some purulence from the proximal aspect of the wound and I extended the excision Prilosec similarly, incision proximally, and found a significant amount of purulence coming from the proximal aspect of the distal radial ulnar joint.  This was also debrided.  For the wrist debridement I used scissors, scalpel, and a curette.  This was then irrigated copiously through all of the wounds and all of the joints including the radiocarpal, midcarpal, and distal radial ulnar joint.  I then placed an irrigation of wound VAC over the left radial and ulnar incisions, but there was substantial purulence found in that location and I suspect that she will need multiple failure washouts if she is medically capable of withstanding them.  Once the wound vacs were placed she was kept intubated, a Foley placed, and returned to the intensive care unit.  There were no complications and she tolerated the procedure as  well as could be hoped.

## 2017-09-11 NOTE — Progress Notes (Signed)
[facility] Progress Note Patient Name: Jessica Howard DOB: [birth year] MRN: [id number]   Date of Service  09/27/2017  HPI/Events of Note  Post-op agitation and tube biting despite 1.2 mcg of Demedotomidine  eICU Interventions  Substitute Diprivan for Dex. Fentanyl PAD protocol orders.        [name] Ogan 10/06/2017, 7:49 PM

## 2017-09-11 NOTE — H&P (Signed)
    INTERVAL PROCEDURE H&P  History and Physical Interval Note:  09/17/2017 3:38 PM  Jessica Howard has presented today for their planned procedure. The various methods of treatment have been discussed with the patient and family. After consideration of risks, benefits and other options for treatment, the patient has consented to the procedure.  The patients' outpatient history has been reviewed, patient examined, and no change in status from most recent office note within the past 30 days. I have reviewed the patients' chart and labs and will proceed as planned. Questions were answered to the patient's satisfaction.   Pixie Casino, MD, [facility], [place] Director of the [facility] Diplomate of the American Board of Clinical Lipidology Attending Cardiologist  Direct Dial: [phone]  Fax: [phone]  Website:  [email or web address] 09/13/2017, 3:38 PM

## 2017-09-12 ENCOUNTER — Encounter (HOSPITAL_COMMUNITY): Payer: Self-pay | Admitting: *Deleted

## 2017-09-12 ENCOUNTER — Inpatient Hospital Stay (HOSPITAL_COMMUNITY): Payer: Medicare Other

## 2017-09-12 DIAGNOSIS — Z9911 Dependence on respirator [ventilator] status: Secondary | ICD-10-CM

## 2017-09-12 DIAGNOSIS — L0291 Cutaneous abscess, unspecified: Secondary | ICD-10-CM

## 2017-09-12 DIAGNOSIS — N179 Acute kidney failure, unspecified: Secondary | ICD-10-CM

## 2017-09-12 DIAGNOSIS — M869 Osteomyelitis, unspecified: Secondary | ICD-10-CM

## 2017-09-12 LAB — GLUCOSE, CAPILLARY
Glucose-Capillary: 138 mg/dL — ABNORMAL HIGH (ref 65–99)
Glucose-Capillary: 143 mg/dL — ABNORMAL HIGH (ref 65–99)
Glucose-Capillary: 148 mg/dL — ABNORMAL HIGH (ref 65–99)
Glucose-Capillary: 165 mg/dL — ABNORMAL HIGH (ref 65–99)
Glucose-Capillary: 170 mg/dL — ABNORMAL HIGH (ref 65–99)
Glucose-Capillary: 197 mg/dL — ABNORMAL HIGH (ref 65–99)

## 2017-09-12 LAB — PROTIME-INR
INR: 1.52
Prothrombin Time: 18.2 seconds — ABNORMAL HIGH (ref 11.4–15.2)

## 2017-09-12 LAB — CBC
HCT: 19.6 % — ABNORMAL LOW (ref 36.0–46.0)
Hemoglobin: 6.5 g/dL — CL (ref 12.0–15.0)
MCH: 21.6 pg — ABNORMAL LOW (ref 26.0–34.0)
MCHC: 33.2 g/dL (ref 30.0–36.0)
MCV: 65.1 fL — ABNORMAL LOW (ref 78.0–100.0)
Platelets: 272 10*3/uL (ref 150–400)
RBC: 3.01 MIL/uL — AB (ref 3.87–5.11)
RDW: 23.9 % — ABNORMAL HIGH (ref 11.5–15.5)
WBC: 22.5 10*3/uL — AB (ref 4.0–10.5)

## 2017-09-12 LAB — RENAL FUNCTION PANEL
ALBUMIN: 1.6 g/dL — AB (ref 3.5–5.0)
Albumin: 1.5 g/dL — ABNORMAL LOW (ref 3.5–5.0)
Anion gap: 12 (ref 5–15)
Anion gap: 11 (ref 5–15)
BUN: 40 mg/dL — AB (ref 6–20)
BUN: 42 mg/dL — AB (ref 6–20)
CHLORIDE: 102 mmol/L (ref 101–111)
CO2: 20 mmol/L — ABNORMAL LOW (ref 22–32)
CO2: 22 mmol/L (ref 22–32)
CREATININE: 3.02 mg/dL — AB (ref 0.44–1.00)
Calcium: 7.9 mg/dL — ABNORMAL LOW (ref 8.9–10.3)
Calcium: 8.1 mg/dL — ABNORMAL LOW (ref 8.9–10.3)
Chloride: 103 mmol/L (ref 101–111)
Creatinine, Ser: 3.12 mg/dL — ABNORMAL HIGH (ref 0.44–1.00)
GFR calc Af Amer: 19 mL/min — ABNORMAL LOW (ref >60)
GFR calc non Af Amer: 16 mL/min — ABNORMAL LOW (ref >60)
GFR, EST AFRICAN AMERICAN: 18 mL/min — AB (ref >60)
GFR, EST NON AFRICAN AMERICAN: 16 mL/min — AB (ref >60)
Glucose, Bld: 175 mg/dL — ABNORMAL HIGH (ref 65–99)
Glucose, Bld: 159 mg/dL — ABNORMAL HIGH (ref 65–99)
PHOSPHORUS: 4.3 mg/dL (ref 2.5–4.6)
POTASSIUM: 3.7 mmol/L (ref 3.5–5.1)
Phosphorus: 5.1 mg/dL — ABNORMAL HIGH (ref 2.5–4.6)
Potassium: 4.1 mmol/L (ref 3.5–5.1)
SODIUM: 134 mmol/L — AB (ref 135–145)
Sodium: 136 mmol/L (ref 135–145)

## 2017-09-12 LAB — CULTURE, BLOOD (ROUTINE X 2): CULTURE: NO GROWTH

## 2017-09-12 LAB — HEPATIC FUNCTION PANEL
ALBUMIN: 1.6 g/dL — AB (ref 3.5–5.0)
ALK PHOS: 417 U/L — AB (ref 38–126)
ALT: 24 U/L (ref 14–54)
AST: 49 U/L — AB (ref 15–41)
BILIRUBIN TOTAL: 3.8 mg/dL — AB (ref 0.3–1.2)
Bilirubin, Direct: 2.4 mg/dL — ABNORMAL HIGH (ref 0.1–0.5)
Indirect Bilirubin: 1.4 mg/dL — ABNORMAL HIGH (ref 0.3–0.9)
Total Protein: 5.8 g/dL — ABNORMAL LOW (ref 6.5–8.1)

## 2017-09-12 LAB — BODY FLUID CULTURE

## 2017-09-12 LAB — POCT I-STAT 3, VENOUS BLOOD GAS (G3P V)
Acid-base deficit: 3 mmol/L — ABNORMAL HIGH (ref 0.0–2.0)
BICARBONATE: 22.6 mmol/L (ref 20.0–28.0)
O2 Saturation: 53 %
TCO2: 24 mmol/L (ref 22–32)
pCO2, Ven: 43.2 mmHg — ABNORMAL LOW (ref 44.0–60.0)
pH, Ven: 7.327 (ref 7.250–7.430)
pO2, Ven: 30 mmHg — CL (ref 32.0–45.0)

## 2017-09-12 LAB — HEMOGLOBIN AND HEMATOCRIT, BLOOD
HCT: 22.1 % — ABNORMAL LOW (ref 36.0–46.0)
Hemoglobin: 7.4 g/dL — ABNORMAL LOW (ref 12.0–15.0)

## 2017-09-12 LAB — HEPARIN LEVEL (UNFRACTIONATED): HEPARIN UNFRACTIONATED: 0.14 [IU]/mL — AB (ref 0.30–0.70)

## 2017-09-12 LAB — PREPARE RBC (CROSSMATCH)

## 2017-09-12 MED ORDER — PANTOPRAZOLE SODIUM 40 MG IV SOLR
40.0000 mg | INTRAVENOUS | Status: DC
  Administered 2017-09-12 – 2017-09-20 (×9): 40 mg via INTRAVENOUS
  Filled 2017-09-12 (×10): qty 40

## 2017-09-12 MED ORDER — LIDOCAINE HCL 1 % IJ SOLN
INTRAMUSCULAR | Status: AC
  Filled 2017-09-12: qty 20

## 2017-09-12 MED ORDER — DOCUSATE SODIUM 50 MG/5ML PO LIQD
100.0000 mg | Freq: Two times a day (BID) | ORAL | Status: DC
Start: 2017-09-12 — End: 2017-09-22
  Administered 2017-09-12 – 2017-09-22 (×18): 100 mg
  Filled 2017-09-12 (×23): qty 10

## 2017-09-12 MED ORDER — PRISMASOL BGK 4/2.5 32-4-2.5 MEQ/L IV SOLN
INTRAVENOUS | Status: DC
Start: 2017-09-12 — End: 2017-09-14
  Administered 2017-09-12 – 2017-09-14 (×3): via INTRAVENOUS_CENTRAL
  Filled 2017-09-12 (×4): qty 5000

## 2017-09-12 MED ORDER — SENNOSIDES 8.8 MG/5ML PO SYRP
5.0000 mL | ORAL_SOLUTION | Freq: Every day | ORAL | Status: DC
  Administered 2017-09-12 – 2017-09-22 (×8): 5 mL
  Filled 2017-09-12 (×11): qty 5

## 2017-09-12 MED ORDER — INSULIN ASPART 100 UNIT/ML ~~LOC~~ SOLN
0.0000 [IU] | SUBCUTANEOUS | Status: DC
  Administered 2017-09-12: 2 [IU] via SUBCUTANEOUS
  Administered 2017-09-12 (×2): 3 [IU] via SUBCUTANEOUS
  Administered 2017-09-12 – 2017-09-19 (×13): 2 [IU] via SUBCUTANEOUS
  Administered 2017-09-21 – 2017-09-22 (×9): 3 [IU] via SUBCUTANEOUS

## 2017-09-12 MED ORDER — PRISMASOL BGK 4/2.5 32-4-2.5 MEQ/L IV SOLN
INTRAVENOUS | Status: DC
  Administered 2017-09-12 – 2017-09-13 (×3): via INTRAVENOUS_CENTRAL
  Filled 2017-09-12 (×6): qty 5000

## 2017-09-12 MED ORDER — SODIUM CHLORIDE 0.9 % IV SOLN: Freq: Once | INTRAVENOUS | Status: DC

## 2017-09-12 MED ORDER — HEPARIN (PORCINE) 2000 UNITS/L FOR CRRT
INTRAVENOUS_CENTRAL | Status: DC | PRN
  Administered 2017-09-12: 15:00:00 via INTRAVENOUS_CENTRAL
  Filled 2017-09-12: qty 1000

## 2017-09-12 MED ORDER — HEPARIN SODIUM (PORCINE) 1000 UNIT/ML DIALYSIS
1000.0000 [IU] | INTRAMUSCULAR | Status: DC | PRN
  Administered 2017-09-14: 2800 [IU] via INTRAVENOUS_CENTRAL
  Filled 2017-09-12 (×2): qty 6

## 2017-09-12 MED ORDER — PRISMASOL BGK 4/2.5 32-4-2.5 MEQ/L IV SOLN
INTRAVENOUS | Status: DC
  Administered 2017-09-12 – 2017-09-14 (×12): via INTRAVENOUS_CENTRAL
  Filled 2017-09-12 (×18): qty 5000

## 2017-09-12 NOTE — Progress Notes (Signed)
DAILY PROGRESS NOTE   Patient Name: Jessica Howard Date of Encounter: 09/12/2017  Chief Complaint   Intubated, sedated on vent  Patient Profile   Jessica Howard is a 57 y.o. female with a hx of hypertension, diabetes type II on insulin, RA on immunosuppressives, thyroid disease status post radiation, sleep apnea, hyperlipidemia, GERD, beta thalassemia with chronic anemia, chronic back pain and diabetic neuropathy who is being seen today for the evaluation of atrial fibrillation with RVR at the request of Dr. Vaughan Browner.  Subjective   S/p TEE and cardioversion by myself yesterday, which went well. She has maintained sinus rhythm overnight. No LAA thrombus. Mitral valve thickening, but no large vegetation - endocarditis cannot be excluded. Has had bleeding from the right great toe overnight - plans for possible amputation on Sunday and revisit to the OR. IV heparin is on hold due to anemia - she is being transfused.  Objective   Vitals:   09/12/17 0745 09/12/17 0800 09/12/17 0815 09/12/17 0830  BP: (!) 115/57 (!) 112/53 (!) 116/57 104/75  Pulse: (!) 59 (!) 59 (!) 59 (!) 59  Resp: (!) 22 (!) 21 20 (!) 22  Temp:      TempSrc:      SpO2: 100% 100% 100% 100%  Weight:      Height:        Intake/Output Summary (Last 24 hours) at 09/12/2017 0853 Last data filed at 09/12/2017 0800 Gross per 24 hour  Intake 2345.82 ml  Output 365 ml  Net 1980.82 ml   Filed Weights   09/28/2017 0400 10/02/2017 1212 09/12/17 0630  Weight: 275 lb 9.2 oz (125 kg) 275 lb (124.7 kg) 281 lb 4.9 oz (127.6 kg)    Physical Exam   General appearance: Morbidly obese, intubated, sedated on vent Neck: no carotid bruit, thyroid not enlarged, symmetric, no tenderness/mass/nodules and thick neck Lungs: diminished breath sounds bilaterally Heart: regular rate and rhythm Abdomen: morbidly obese Extremities: edema 1+ edema Pulses: 2+ and symmetric Skin: Skin color, texture, turgor normal. No rashes or  lesions Neurologic: Mental status: intubated, sedated on vent Psych: Cannot assesss  Inpatient Medications    Scheduled Meds: . chlorhexidine gluconate (MEDLINE KIT)  15 mL Mouth Rinse BID  . Chlorhexidine Gluconate Cloth  6 each Topical Daily  . docusate  100 mg Per Tube BID  . insulin aspart  0-15 Units Subcutaneous Q4H  . lactulose  10 g Oral Daily  . mouth rinse  15 mL Mouth Rinse QID  . pantoprazole (PROTONIX) IV  40 mg Intravenous Q24H  . sennosides  5 mL Per Tube Daily  . sodium chloride flush  3 mL Intravenous Q12H    Continuous Infusions: . sodium chloride 10 mL/hr at 10/05/2017 1900  . sodium chloride    . amiodarone 30 mg/hr (10/01/2017 2231)  . DAPTOmycin (CUBICIN)  IV Stopped (09/10/17 1923)  . fentaNYL infusion INTRAVENOUS    . phenylephrine (NEO-SYNEPHRINE) Adult infusion 20 mcg/min (09/12/17 0800)  . propofol (DIPRIVAN) infusion 15 mcg/kg/min (09/12/17 0800)    PRN Meds: acetaminophen **OR** acetaminophen, fentaNYL, LORazepam, ondansetron **OR** ondansetron (ZOFRAN) IV   Labs   Results for orders placed or performed during the hospital encounter of 08/15/2017 (from the past 48 hour(s))  Glucose, capillary     Status: Abnormal   Collection Time: 09/10/17 11:39 AM  Result Value Ref Range   Glucose-Capillary 173 (H) 65 - 99 mg/dL   Comment 1 Capillary Specimen    Comment 2 Notify RN  Renal function panel (daily at 1600)     Status: Abnormal   Collection Time: 09/10/17  6:06 PM  Result Value Ref Range   Sodium 135 135 - 145 mmol/L   Potassium 3.5 3.5 - 5.1 mmol/L   Chloride 101 101 - 111 mmol/L   CO2 22 22 - 32 mmol/L   Glucose, Bld 215 (H) 65 - 99 mg/dL   BUN 25 (H) 6 - 20 mg/dL   Creatinine, Ser 2.32 (H) 0.44 - 1.00 mg/dL   Calcium 8.1 (L) 8.9 - 10.3 mg/dL   Phosphorus 4.3 2.5 - 4.6 mg/dL   Albumin 1.4 (L) 3.5 - 5.0 g/dL   GFR calc non Af Amer 23 (L) >60 mL/min   GFR calc Af Amer 26 (L) >60 mL/min    Comment: (NOTE) The eGFR has been calculated using  the CKD EPI equation. This calculation has not been validated in all clinical situations. eGFR's persistently <60 mL/min signify possible Chronic Kidney Disease.    Anion gap 12 5 - 15    Comment: Performed at [facility] [address], [place], [place] [address]  Glucose, capillary     Status: Abnormal   Collection Time: 09/10/17  6:11 PM  Result Value Ref Range   Glucose-Capillary 204 (H) 65 - 99 mg/dL   Comment 1 Venous Specimen   Type and screen [place]     Status: None (Preliminary result)   Collection Time: 09/10/17  6:40 PM  Result Value Ref Range   ABO/RH(D) A POS    Antibody Screen NEG    Sample Expiration 09/13/2017    PT AG Type NEGATIVE FOR S ANTIGEN    Unit Number [id number]    Blood Component Type RED CELLS,LR    Unit division 00    Status of Unit ISSUED    Donor AG Type NEGATIVE FOR S ANTIGEN    Transfusion Status OK TO TRANSFUSE    Crossmatch Result COMPATIBLE    Unit Number [id number]    Blood Component Type RED CELLS,LR    Unit division 00    Status of Unit ALLOCATED    Donor AG Type NEGATIVE FOR S ANTIGEN    Transfusion Status OK TO TRANSFUSE    Crossmatch Result COMPATIBLE   Heparin level (unfractionated)     Status: Abnormal   Collection Time: 09/10/17  6:41 PM  Result Value Ref Range   Heparin Unfractionated 0.19 (L) 0.30 - 0.70 IU/mL    Comment:        IF HEPARIN RESULTS ARE BELOW EXPECTED VALUES, AND PATIENT DOSAGE HAS BEEN CONFIRMED, SUGGEST FOLLOW UP TESTING OF ANTITHROMBIN III LEVELS. Performed at [facility], [place] [address] [place], [place] [address]   Glucose, capillary     Status: Abnormal   Collection Time: 09/10/17  7:30 PM  Result Value Ref Range   Glucose-Capillary 177 (H) 65 - 99 mg/dL   Comment 1 Capillary Specimen    Comment 2 Notify RN   Glucose, capillary     Status: Abnormal   Collection Time: 09/10/17 11:34 PM  Result Value Ref Range   Glucose-Capillary 155 (H) 65 - 99 mg/dL    Comment 1 Capillary Specimen    Comment 2 Notify RN   Glucose, capillary     Status: Abnormal   Collection Time: 10/03/2017  3:47 AM  Result Value Ref Range   Glucose-Capillary 173 (H) 65 - 99 mg/dL   Comment 1 Capillary Specimen  Comment 2 Notify RN   Magnesium     Status: Abnormal   Collection Time: 09/19/2017  4:09 AM  Result Value Ref Range   Magnesium 2.6 (H) 1.7 - 2.4 mg/dL    Comment: Performed at [facility] [address] [place], [place] [address]  CBC     Status: Abnormal   Collection Time: 09/13/2017  4:09 AM  Result Value Ref Range   WBC 20.3 (H) 4.0 - 10.5 K/uL   RBC 3.76 (L) 3.87 - 5.11 MIL/uL   Hemoglobin 8.0 (L) 12.0 - 15.0 g/dL   HCT 24.5 (L) 36.0 - 46.0 %   MCV 65.2 (L) 78.0 - 100.0 fL   MCH 21.3 (L) 26.0 - 34.0 pg   MCHC 32.7 30.0 - 36.0 g/dL   RDW 23.6 (H) 11.5 - 15.5 %   Platelets 266 150 - 400 K/uL    Comment: REPEATED TO VERIFY PLATELET COUNT CONFIRMED BY SMEAR Performed at [facility] [address] [place], [place] [address]   Hepatic function panel     Status: Abnormal   Collection Time: 09/13/2017  4:09 AM  Result Value Ref Range   Total Protein 6.1 (L) 6.5 - 8.1 g/dL   Albumin 1.4 (L) 3.5 - 5.0 g/dL   AST 67 (H) 15 - 41 U/L   ALT 28 14 - 54 U/L   Alkaline Phosphatase 527 (H) 38 - 126 U/L   Total Bilirubin 5.5 (H) 0.3 - 1.2 mg/dL   Bilirubin, Direct 3.5 (H) 0.1 - 0.5 mg/dL   Indirect Bilirubin 2.0 (H) 0.3 - 0.9 mg/dL    Comment: Performed at [facility] [address], [place], [place] [address]  Heparin level (unfractionated)     Status: Abnormal   Collection Time: 10/07/2017  4:24 AM  Result Value Ref Range   Heparin Unfractionated <0.10 (L) 0.30 - 0.70 IU/mL    Comment:        IF HEPARIN RESULTS ARE BELOW EXPECTED VALUES, AND PATIENT DOSAGE HAS BEEN CONFIRMED, SUGGEST FOLLOW UP TESTING OF ANTITHROMBIN III LEVELS. Performed at [facility], [place] [address], [place], [place] [address]   Glucose, capillary      Status: Abnormal   Collection Time: 09/13/2017  7:35 AM  Result Value Ref Range   Glucose-Capillary 176 (H) 65 - 99 mg/dL   Comment 1 Capillary Specimen   Glucose, capillary     Status: Abnormal   Collection Time: 09/19/2017 11:28 AM  Result Value Ref Range   Glucose-Capillary 152 (H) 65 - 99 mg/dL   Comment 1 Capillary Specimen   Glucose, capillary     Status: Abnormal   Collection Time: 10/04/2017  5:51 PM  Result Value Ref Range   Glucose-Capillary 201 (H) 65 - 99 mg/dL   Comment 1 Capillary Specimen   Renal function panel (daily at 1600)     Status: Abnormal   Collection Time: 09/17/2017  5:52 PM  Result Value Ref Range   Sodium 136 135 - 145 mmol/L   Potassium 4.3 3.5 - 5.1 mmol/L    Comment: DELTA CHECK NOTED   Chloride 102 101 - 111 mmol/L   CO2 22 22 - 32 mmol/L   Glucose, Bld 214 (H) 65 - 99 mg/dL   BUN 28 (H) 6 - 20 mg/dL   Creatinine, Ser 2.24 (H) 0.44 - 1.00 mg/dL   Calcium 8.1 (L) 8.9 - 10.3 mg/dL   Phosphorus 4.2 2.5 - 4.6 mg/dL  Albumin 1.8 (L) 3.5 - 5.0 g/dL   GFR calc non Af Amer 23 (L) >60 mL/min   GFR calc Af Amer 27 (L) >60 mL/min    Comment: (NOTE) The eGFR has been calculated using the CKD EPI equation. This calculation has not been validated in all clinical situations. eGFR's persistently <60 mL/min signify possible Chronic Kidney Disease.    Anion gap 12 5 - 15    Comment: Performed at [facility] [address] [place], [place] [address]  Glucose, capillary     Status: Abnormal   Collection Time: 09/19/2017  7:40 PM  Result Value Ref Range   Glucose-Capillary 203 (H) 65 - 99 mg/dL  Triglycerides     Status: Abnormal   Collection Time: 09/19/2017  9:11 PM  Result Value Ref Range   Triglycerides 172 (H) <150 mg/dL    Comment: Performed at [facility] [address] [place], [place] [address]  Glucose, capillary     Status: Abnormal   Collection Time: 10/01/2017 11:40 PM  Result Value Ref Range   Glucose-Capillary 198 (H) 65 - 99 mg/dL    Comment 1 Capillary Specimen    Comment 2 Notify RN   Glucose, capillary     Status: Abnormal   Collection Time: 09/12/17  3:06 AM  Result Value Ref Range   Glucose-Capillary 197 (H) 65 - 99 mg/dL   Comment 1 Capillary Specimen    Comment 2 Notify RN   CBC     Status: Abnormal   Collection Time: 09/12/17  4:35 AM  Result Value Ref Range   WBC 22.5 (H) 4.0 - 10.5 K/uL   RBC 3.01 (L) 3.87 - 5.11 MIL/uL   Hemoglobin 6.5 (LL) 12.0 - 15.0 g/dL    Comment: REPEATED TO VERIFY CRITICAL RESULT CALLED TO, READ BACK BY AND VERIFIED WITH: M.HARPER,RN 1194 09/12/17 G.MCADOO    HCT 19.6 (L) 36.0 - 46.0 %   MCV 65.1 (L) 78.0 - 100.0 fL   MCH 21.6 (L) 26.0 - 34.0 pg   MCHC 33.2 30.0 - 36.0 g/dL   RDW 23.9 (H) 11.5 - 15.5 %   Platelets 272 150 - 400 K/uL    Comment: Performed at [facility] [address] [place], [place] [address]  Heparin level (unfractionated)     Status: Abnormal   Collection Time: 09/12/17  4:35 AM  Result Value Ref Range   Heparin Unfractionated 0.14 (L) 0.30 - 0.70 IU/mL    Comment:        IF HEPARIN RESULTS ARE BELOW EXPECTED VALUES, AND PATIENT DOSAGE HAS BEEN CONFIRMED, SUGGEST FOLLOW UP TESTING OF ANTITHROMBIN III LEVELS. Performed at [facility], [place] [address] [place], [place] [address]   Protime-INR     Status: Abnormal   Collection Time: 09/12/17  4:35 AM  Result Value Ref Range   Prothrombin Time 18.2 (H) 11.4 - 15.2 seconds   INR 1.52     Comment: Performed at [facility] [address], [place], [place] [address]  Prepare RBC     Status: None   Collection Time: 09/12/17  6:02 AM  Result Value Ref Range   Order Confirmation      ORDER PROCESSED BY BLOOD BANK Performed at [facility], [place] [address] [place],  [address]   Hepatic function panel     Status: Abnormal   Collection Time: 09/12/17  6:43 AM  Result Value Ref Range  Total Protein 5.8 (L) 6.5 - 8.1 g/dL   Albumin 1.6 (L) 3.5 - 5.0 g/dL   AST 49 (H)  15 - 41 U/L   ALT 24 14 - 54 U/L   Alkaline Phosphatase 417 (H) 38 - 126 U/L   Total Bilirubin 3.8 (H) 0.3 - 1.2 mg/dL   Bilirubin, Direct 2.4 (H) 0.1 - 0.5 mg/dL   Indirect Bilirubin 1.4 (H) 0.3 - 0.9 mg/dL    Comment: Performed at [facility] [address], [place], [place] [address]  Glucose, capillary     Status: Abnormal   Collection Time: 09/12/17  8:04 AM  Result Value Ref Range   Glucose-Capillary 170 (H) 65 - 99 mg/dL   Comment 1 Capillary Specimen    Comment 2 Notify RN     ECG   N/A  Telemetry   Sinus rhythm - Personally Reviewed  Radiology    Mr Lumbar Spine Wo Contrast  Result Date: 09/10/2017 CLINICAL DATA:  Bacteremia. Rheumatoid arthritis on immunosuppressant. Toe and wrist septic arthritis. Back pain. EXAM: MRI LUMBAR SPINE WITHOUT CONTRAST TECHNIQUE: Multiplanar, multisequence MR imaging of the lumbar spine was performed. No intravenous contrast was administered. COMPARISON:  Abdominal CT from yesterday FINDINGS: Segmentation: 5 lumbar type vertebral bodies based on the lowest ribs. Alignment:  Trace degenerative anterolisthesis at L4-5 and L5-S1. Vertebrae: Negative for discitis or visible osteomyelitis. There is fluid signal around the right L1-2 facet joint measuring up to 23 mm. No underlying joint erosions seen yesterday but there is marrow edema today. There is no noncontrast evidence of epidural collection. Preserved foraminal fat negative for fracture. Conus medullaris and cauda equina: Conus extends to the L1-2 level. Conus and cauda equina appear normal. Paraspinal and other soft tissues: Edematous type signal in the right more than left intrinsic back muscles, superimposed on fatty atrophy. Disc levels: T12- L1: Negative L1-L2: Acute findings at the right facet. L2-L3: Negative L3-L4: Negative L4-L5: Degenerative facet spurring with joint effusion. Trace anterolisthesis. Early endplate degeneration [id number] arthropathy with spurring and trace  anterolisthesis. Mild disc bulging. Mild endplate degeneration. IMPRESSION: 1. Limited by motion and lack of contrast in this patient with acute renal failure. 2. Septic arthritis at the right L1-2 facet with 2.3 cm posterior collection. No evidence of spinal canal or foraminal abscess. 3. Degenerative changes noted above. Electronically Signed   By: Monte Fantasia M.D.   On: 09/10/2017 17:35   Mr Foot Right Wo Contrast  Result Date: 09/10/2017 CLINICAL DATA:  Great toe infection. History of rheumatoid arthritis. EXAM: MRI OF THE RIGHT FOREFOOT WITHOUT CONTRAST TECHNIQUE: Multiplanar, multisequence MR imaging of the right forefoot was performed. No intravenous contrast was administered. COMPARISON:  Right foot x-rays dated September 05, 2017. FINDINGS: Despite efforts by the technologist and patient, motion artifact is present on today's exam and could not be eliminated. This reduces exam sensitivity and specificity. Bones/Joint/Cartilage Cortical destruction and abnormal marrow edema with decreased T1 marrow signal involving the first proximal and distal phalanges surrounding the first IP joint. Prominent first IP joint effusion. Mild osteoarthritis of the first MTP joint. There are multiple small erosions involving the navicular, cuneiforms, and metatarsal bases, likely related to patient's history of rheumatoid arthritis. Ligaments The toe collateral ligaments are intact. Muscles and Tendons There is a 4.5 x 3.4 cm multiloculated area of increased T2 signal in the intrinsic muscles of the forefoot at the plantar aspect of the second metatarsal, incompletely evaluated. This appears to correspond with muscle signal on the  T1 coronal images. Soft tissues Multiple small fluid collections surrounding the first IP joint. Moderate dorsal forefoot soft tissue swelling. IMPRESSION: 1. Limited study. Septic arthritis of the first IP joint with associated osteomyelitis of the first proximal and distal phalanges. There are  multiple small fluid collections surrounding the first IP joint which may represent distention of the joint space and/or small adjacent abscesses. 2. 4.5 x 3.4 cm multiloculated area of increased T2 signal in the intrinsic muscles of the forefoot at the plantar aspect of the second metatarsal, incompletely evaluated. This appears to correspond with muscle signal on the T1 coronal images and may reflect myositis or diabetic muscle changes. Electronically Signed   By: Titus Dubin M.D.   On: 09/10/2017 17:59   Dg Chest Port 1 View  Result Date: 09/12/2017 CLINICAL DATA:  Intubation EXAM: PORTABLE CHEST 1 VIEW COMPARISON:  09/09/2017 FINDINGS: Endotracheal tube has been placed with tip measuring 3.5 cm above the carina. Enteric tube tip is off the field of view but below the left hemidiaphragm. Right central venous catheter with tip over the cavoatrial junction region. Shallow inspiration with atelectasis in the lung bases. Probable small bilateral pleural effusions. Cardiac enlargement without vascular congestion or edema. No pneumothorax. IMPRESSION: Appliances appear in satisfactory position. Cardiac enlargement. Shallow inspiration with atelectasis in the lung bases. Probable small bilateral pleural effusion. Electronically Signed   By: Lucienne Capers M.D.   On: 09/12/2017 06:56    Cardiac Studies   LV EF: 60% -   65%  ------------------------------------------------------------------- History:   PMH:  For Cardioversion.  Risk factors:  Hypertension. Diabetes mellitus. Dyslipidemia.  ------------------------------------------------------------------- Study Conclusions  - Left ventricle: The cavity size was normal. Wall thickness was   increased in a pattern of moderate LVH. Systolic function was   normal. The estimated ejection fraction was in the range of 60%   to 65%. Wall motion was normal; there were no regional wall   motion abnormalities. - Aortic valve: No evidence of  vegetation. - Mitral valve: Mildly thickened leaflets . Trace to mild   regurgitation. Endocarditis cannot be excluded. No large   vegetation. - Left atrium: The atrium was dilated. No evidence of thrombus in   the atrial cavity or appendage. - Right atrium: The atrium was dilated. - Atrial septum: Aneurysmal interatrial septum. Negative for PFO by   color doppler and saline microbubble contrast. - Tricuspid valve: There was mild regurgitation. RVSP 50 mmHg +   RAP. - Pulmonic valve: No evidence of vegetation.  Impressions:  - LVEF 60-65%, no LAA thrombus, interatrial septal aneurysm without   PFO, mitral valve thickening - cannot exclude endocarditis, trace   to mild MR, moderate LVH, successful cardioversion with a single   150J biphasic shock to NSR.  Assessment   1. Principal Problem: 2.   MSSA bacteremia 3. Active Problems: 4.   Thalassemia 5.   Rheumatoid arthritis ([place]) 6.   Morbid obesity ([place]) 7.   Acute back pain 8.   Essential hypertension 9.   Diabetes ([place]) 10.   Dyslipidemia 11.   Sepsis ([place]) 12.   Acute kidney injury ([place]) 13.   Immunosuppression due to drug therapy 14.   Rheumatoid arthritis(714.0) 15.   Septic joint of left wrist ([place]) 16.   Osteomyelitis of great toe of right foot ([place]) 17.   Hypertension 18.   Hyperlipidemia 19.   Acute hearing loss of left ear 20.   Diabetes mellitus, insulin dependent (IDDM), controlled ([place]) 21.   Beta  thalassemia ([place]) 22.   Symptomatic anemia 23.   Elevated liver enzymes 24.   Hyperbilirubinemia 25.   Altered mental status, unspecified 26.   Atrial flutter ([place]) 27.   Infection of right hand 28.   Plan   1. Maintaining sinus rhythm after DCCV yesterday - normal LV function. Could not rule-out endocarditis - based on clinical presentation and physical findings, would treat for minimum 6 weeks with antibiotics. Continue IV amiodarone until she can take po, then switch to oral amiodarone. IV heparin on hold  - there is an increased risk of LV thrombus formation after cardioversion for at least 2-4 weeks - generally we recommend a minimum of 4 weeks anticoagulation. Since she is having active bleeding on heparin, agree with holding for now and transfusing. The bleeding source seems to be the right great toe and will likely be controlled with amputation. Would recommend restarting heparin again at that point.  Time Spent Directly with Patient:  I have spent a total of 35 minutes with the patient reviewing hospital notes, telemetry, EKGs, labs and examining the patient as well as establishing an assessment and plan that was discussed personally with the patient. > 50% of time was spent in direct patient care.  Length of Stay:  LOS: 7 days   Pixie Casino, MD, [facility], [place] Director of the [facility] Lipid Disorders &  Cardiovascular Risk Reduction [facility] Diplomate of the American Board of Clinical Lipidology Attending Cardiologist  Direct Dial: [phone]  Fax: [phone]  Website:  [email or web address] 09/12/2017, 8:53 AM

## 2017-09-12 NOTE — Progress Notes (Signed)
PULMONARY  / CRITICAL CARE MEDICINE  Name: Jessica Howard MRN: [id number] DOB: [birth year]    LOS: 59  REFERRING MD :  Velvet Bathe MD  CHIEF COMPLAINT:  Back Pain   BRIEF PATIENT DESCRIPTION: Patient is a 57 y.o female with RA on immunosuppressive therapy (Levunomide and Abatacept) and DM who presented to the ED with 2 weeks of progressive myalgias and arthralgias. Initial work-up was significant for AKI, HAGMA, elevated liver enzymes, lactic acidosis, leukocytosis, and acute on chronic anemia. Blood cultures subsequently grew MSSA (4/4). Initial TTE was negative for features of endocarditis; however, patient has pathologic PE findings including splinter hemorrhages and Olser nodes. Her renal function worsened over the course of her hospitalization and she is requiring CRRT. The patient also converted to atrial fibrillation and was placed on amiodarone, diltiazem, and lopressor. MRI of the right foot illustrated septic arthritis of the right first IP with associated osteomyelitis. MRI of the lumbar spine showed septic arthritis of the right L1-2 facet joint with associated fluid collection. The patient went for I&D on 4/4 and we obtained a TEE that could not exclude endocarditis. She was successfully cardioverted at that time. She has transitioned off CRRT.  LINES / TUBES: Right peripheral IV  Right IJ tunneled HD catheter  Urinary catheter [place] rain right hand  Wound VAC left hand   CULTURES: - 4/4 blood cultures on 3/29 for MSSA - Urine cultures on 3/29 growing MSSA - 1/2 Blood cultures on 3/30 with MSSA - Right toe superficial wound culture with staph aureus  - Left wrist synovial fluid with gram positive cocci  - Repeat blood cultures 4/4 pending   ANTIBIOTICS: - Vancomycin 3/29 -> discontinued on 3/30  - Aztreonam 3/29 -> discontinued on 3/30  - Flagyl 3/29 -> discontinued on 3/30  - Daptomycin 3/30 -> current   INTERVAL HISTORY:  Nursing reports issues with BP control  and bleeding from the right foot. Have not been able to wean the neo due to having soft BP. Hgb was low this AM, giving 1 unit. Lost peripheral IV access.   Orthopedic surgery took the patient to the OR yesterday and performed I&D of the right hand, left hand, and right foot. Patient tolerated the procedure well, but was left on the ventilator afterwards.   Cardiology preformed a TEE yesterday that could not rule out endocarditis as there was thickening of the mitral valve. No large vegetation or thrombus was noted and the patient was successfully cardioverted.   Neurosurgery recommended IR consultation for aspiration of the fluid collection at the right L1-2 facet joint.   VITAL SIGNS: Temp:  [97.5 F (36.4 C)-98.6 F (37 C)] 98.1 F (36.7 C) (04/05 0705) Pulse Rate:  [55-138] 60 (04/05 0705) Resp:  [15-38] 18 (04/05 0705) BP: (55-187)/(34-112) 131/56 (04/05 0705) SpO2:  [90 %-100 %] 100 % (04/05 0705) FiO2 (%):  [40 %] 40 % (04/05 0400) Weight:  [275 lb (124.7 kg)-281 lb 4.9 oz (127.6 kg)] 281 lb 4.9 oz (127.6 kg) (04/05 0630)  INTAKE / OUTPUT: Intake/Output      04/04 0701 - 04/05 0700 04/05 0701 - 04/06 0700   P.O.     I.V. (mL/kg) 1843.8 (14.5)    Blood  30   Other 10    NG/GT 90    IV Piggyback 250    Total Intake(mL/kg) 2193.8 (17.2) 30 (0.2)   Urine (mL/kg/hr) 51 (0)    Drains 50    Other 286    Total Output  387    Net +1806.8 +30          PHYSICAL EXAMINATION:  General: Obese female, sedated  Neuro: Sedated  HEENT: Normocephalic, atraumatic, moist mucus membranes Cardiovascular: RRR, no murmurs, no rubs Lungs: Good air movement with no wheezing or crackles  Abdomen: Active bowel sounds, soft, no tenderness to palpation  Musculoskeletal: Mild LE edema  Skin: Warm and dry   LABS: Cbc Recent Labs  Lab 09/10/17 0434 09/29/2017 0409 09/12/17 0435  WBC 25.3* 20.3* 22.5*  HGB 9.2* 8.0* 6.5*  HCT 28.0* 24.5* 19.6*  PLT 352 266 272   Chemistry Recent Labs   Lab 09/09/17 0432  09/10/17 0434 09/10/17 0436 09/10/17 1806 09/25/2017 0409 09/25/2017 1752  NA 136   < >  --  135 135  --  136  K 4.0   < >  --  3.9 3.5  --  4.3  CL 99*   < >  --  98* 101  --  102  CO2 22   < >  --  22 22  --  22  BUN 32*   < >  --  24* 25*  --  28*  CREATININE 2.55*   < >  --  2.31* 2.32*  --  2.24*  CALCIUM 8.1*   < >  --  8.4* 8.1*  --  8.1*  MG 2.5*  --  2.7*  --   --  2.6*  --   PHOS 4.7*   < >  --  4.1 4.3  --  4.2  GLUCOSE 150*   < >  --  187* 215*  --  214*   < > = values in this interval not displayed.   Liver fxn Recent Labs  Lab 09/09/17 0432 09/09/17 1004  09/10/17 1806 10/01/2017 0409 09/13/2017 1752  AST 102* 98*  --   --  67*  --   ALT 30 30  --   --  28  --   ALKPHOS 526* 541*  --   --  527*  --   BILITOT 5.4* 5.4*  --   --  5.5*  --   PROT 6.9 6.4*  --   --  6.1*  --   ALBUMIN 1.5*  1.5* 1.5*   < > 1.4* 1.4* 1.8*   < > = values in this interval not displayed.   coags Recent Labs  Lab 09/04/2017 1531 09/09/17 1004  APTT 36  --   INR 1.45 1.69   Sepsis markers Recent Labs  Lab 08/23/2017 1531 08/27/2017 2001 09/06/17 0310 09/06/17 2349 09/09/17 1004 09/09/17 1241  LATICACIDVEN  --  1.9  --   --  1.5 1.2  PROCALCITON 11.11  --  18.40 20.69  --   --    Cardiac markers Recent Labs  Lab 09/06/17 1030 09/10/17 0434  CKTOTAL 233 131   BNP No results for input(s): PROBNP in the last 168 hours.   ABG Recent Labs  Lab 08/26/2017 0805 09/09/17 1045  HCO3 20.3 22.6  TCO2 21* 24   CBG trend Recent Labs  Lab 09/17/2017 1128 09/27/2017 1751 09/15/2017 1940 10/05/2017 2340 09/12/17 0306  GLUCAP 152* 201* 203* 198* 197*   DIAGNOSES: Principal Problem:   MSSA bacteremia Active Problems:   Thalassemia   Rheumatoid arthritis ([place])   Morbid obesity ([place])   Acute back pain   Essential hypertension   Diabetes ([place])   Dyslipidemia   Sepsis ([place])   Acute kidney injury ([place])  Immunosuppression due to drug therapy   Rheumatoid  arthritis(714.0)   Septic joint of left wrist ([facility])   Osteomyelitis of great toe of right foot ([facility])   Hypertension   Hyperlipidemia   Acute hearing loss of left ear   Diabetes mellitus, insulin dependent (IDDM), controlled ([facility])   Beta thalassemia ([facility])   Symptomatic anemia   Elevated liver enzymes   Hyperbilirubinemia   Altered mental status, unspecified   Atrial flutter ([facility])   Infection of right hand  ASSESSMENT / PLAN: 57 y.o female with RA on immunosuppressive therapy (Levunomide and Abatacept) who presented to the ED on 3/29 with sepsis and multiorgan failure secondary to MSSA Bacteremia/Endocaridits.   INFECTIOUS A: Sepsis secondary to MSSA Bacteremia/Endocarditis  Patient continues to have positive blood cultures. Pathological finding consistent with endocarditis. CT head/abdomen/pelvis with no acute abnormalities.  MRI of the lumbar spine showing findings consistent with septic arthritis of the right L1-2 facet with 2.3 cm posterior collection.  MRI of the right foot showing findings consistent with septic arthritis of the first IP with associated osteomyelitis.  OR on 4/4 for I&D of the right hand, right foot, and left thumb. TEE cannot exclude endocarditis with thickening of the mitral valve.   P:   Infectious disease onboard  Orthopedic surgery onboard s/p I&D on 4/4   Continuing daptomycin Repeat blood cultures pending  Consult IR for aspiration of the fluid collection at right L1-2 facet joint   CARDIOVASCULAR A:  Hypotension Patient is hypertensive at baseline on bystolic, irbesartan, and spironolactone. Started on Neo 4/4  Atrial Fib 2/2 sepsis  TEE without evidence of large vegetation or thrombus, cardioverted on 4/4.  On amiodarone and metoprolol   P:  Cardiology onboard.  Currently in sinus rhythm. Currently on amiodarone and metoprolol. Having bradycardia, stopping the metoprolol.  Currently on phenylephrine. Wean as tolerated. Stop heparin   Maintain MAP >65   RENAL A: AKI secondary to MSSA bacteremia, blood clots in catheter  Creatine elevated from baseline of 0.9 to 3.36 on admission  UA with hematuria, proteinuria, and bacteruria. Cultures growing Staph aureus.  P:   Patient remains anuric. Maintain MAP >65 Nephrology onboard. Stopped CRRT on 4/4 with plans to transition to HD. We will need to get the patient's BP stabilized prior to starting HD.   PULMONARY A: Post surgical ventilation  Obstructive Sleep Apnea  Vent Mode: PRVC FiO2 (%):  [40 %] 40 % Set Rate:  [18 bmp] 18 bmp Vt Set:  [470 mL] 470 mL PEEP:  [5 cmH20] 5 cmH20 Plateau Pressure:  [20 cmH20-26 cmH20] 23 cmH20  P:   Current sedation: Fentanyl and propofol  CXR illustrating ET tube in appropriate position. Shallow lung volumes. Check ABG  GASTROINTESTINAL A: Elevated LFTs ? Cholestatic liver injury  Presented with gross elevation in alk phos and direct bilirubin with minor elevation in LFTs and indirect bilirubin.  Abdominal ultrasound unremarkable for acute etiology with no pericholecystic fluid, wall thickening, gallstones, or ductal dilation.  CT abdomen and pelvis yesterday with normal liver, intra-/extrahepatic biliary duct, and gallbladder.  Ammonium elevated to 44, INR 1.65  P:   Will continue to monitor   HEMATOLOGIC A:  Beta Thalassemia  Acute on chronic anemia secondary to sepsis Hgb dropped from baseline of ~10 to 7.6 on admission.  LDH elevated but haptoglobin elevated and reticulocyte WNL therefore, unlikely to be 2/2 hemolytic anemia.   P:  Hgb down to 6.5 this AM  Will transfuse 1 unit Transfusion goals: Transfuse  1 units pRBCs if <7 or <8 if patient becomes hemodynamically unstable and/or has active bleeding   ENDOCRINE A: Insulin Dependent Type 2 DM On Humolin 75 units TID at home   P:   CBG goal 140-180  Continue SSI-moderate   NEUROLOGICAL A: Altered Mental Status  Multiple contributing factors including  A-fib, sepsis, opiates CT head and ammonium WNL  P:  Mental status waxes and wanes. Will continue treatment for MSSA bacteremia as outlined above  Fentanyl for pain control   Pulmonary and [place] Pager: [phone]  09/12/2017, 7:20 AM

## 2017-09-12 NOTE — Progress Notes (Signed)
RT note-Patient taken to and from CT without difficulty, remains on current ventilator settings.

## 2017-09-12 NOTE — Consult Note (Signed)
Chief Complaint: Patient was seen in consultation today for Lumbar 1-2 fluid collection aspiration Chief Complaint  Patient presents with  . Back Pain    Chronic Pain    at the request of Dr Rolla Etienne  Referring Physician(s): Dr Sherwood Gambler  Supervising Physician: Jacqulynn Cadet  Patient Status: [facility] - In-pt  History of Present Illness: Jessica Howard is a 57 y.o. female   56 Y/O with RA on Aravaadmitted with MSSA bacteremia, AKI secondary to ATN. MSSA bacteremia Wash out of infected jts: Rt toe; Rt thumb and Lt wrist in OR with Dr Mardelle Matte For possible amputation of toe this weekend  CT 4/3: 1. Limited by motion and lack of contrast in this patient with acute renal failure. 2. Septic arthritis at the right L1-2 facet with 2.3 cm posterior collection. No evidence of spinal canal or foraminal abscess. 3. Degenerative changes noted above  Dr Sherwood Gambler note 4/4: . The morbidity associated with an open exploration and debridement far outweighs any potential benefit, and percutaneous aspiration by IR is the indicated approach. No further neurosurgical intervention or follow-up is indicated at this time.  Request made for aspiration Dr Laurence Ferrari has reviewed imaging and approves procedure  Pt is intubated /on vent Will discuss with Dr Laurence Ferrari his feeling-- technically perform procedure?   Past Medical History:  Diagnosis Date  . Asthma   . Beta thalassemia w/chronic anemia    Thalassemia  . Diabetes mellitus type 2, insulin dependent (HCC)    type 2  . Diabetic neuropathy ([place])   . Endometrial polyp   . Fibroadenoma of breast    left  . GERD (gastroesophageal reflux disease)   . Headache    migraines  . Hearing loss, right   . Hepatic steatosis   . Hyperlipidemia   . Hypertension   . Immunocompromised state due to drug therapy   . Impingement syndrome of right shoulder   . Nontraumatic tear of right rotator cuff   . Pneumonia   . Psoriasis   .  Rheumatoid arthritis(714.0)    rhematoid and osteoarthritis  . Sleep apnea   . Thyroid disease    had radiation    Past Surgical History:  Procedure Laterality Date  . ANKLE ARTHROSCOPY Right   . CESAREAN SECTION    . COLONOSCOPY    . DILATION AND CURETTAGE OF UTERUS    . EYE SURGERY     surgery on retina  . HAND SURGERY     for arthritis  . left knee surgery nodule removal    . OTHER SURGICAL HISTORY     multiple nodule removal on neck, hands, left knee  . SHOULDER ARTHROSCOPY WITH ROTATOR CUFF REPAIR AND SUBACROMIAL DECOMPRESSION Right 05/20/2014   Procedure: RIGHT SHOULDER ARTHROSCOPY WITH DEBRIDEMENT/DISTAL CLAVICLE EXCISION/ROTATOR CUFF REPAIR AND SUBACROMIAL DECOMPRESSION;  Surgeon: Lorn Junes, MD;  Location: [place];  Service: Orthopedics;  Laterality: Right;  . TONSILLECTOMY    . TUBAL LIGATION     bilateral    Allergies: Cefuroxime axetil; Cephalosporins; Bactrim [sulfamethoxazole-trimethoprim]; Iohexol; Lisinopril; Penicillins; and Sulfa antibiotics  Medications: Prior to Admission medications   Medication Sig Start Date End Date Taking? Authorizing Provider  abatacept (ORENCIA) 250 MG injection Inject 1,000 mg into the vein every 28 (twenty-eight) days. Monthly Infusion   Yes [provider]  albuterol (PROVENTIL HFA;VENTOLIN HFA) 108 (90 Base) MCG/ACT inhaler Inhale 2 puffs into the lungs every 6 (six) hours as needed for wheezing or shortness of breath.   Yes [provider]  amitriptyline (ELAVIL) 25 MG tablet Take 25 mg by mouth at bedtime.    Yes [provider]  ARTIFICIAL TEAR OP Place 1 drop into both eyes as needed (for dry eyes).   Yes [provider]  BYSTOLIC 10 MG tablet Take 20 mg by mouth daily.  10/07/16  Yes [provider]  chlorthalidone (HYGROTON) 25 MG tablet Take 25 mg by mouth daily.   Yes [provider]  CRESTOR 20 MG tablet Take 20 mg by mouth daily.  03/14/14  Yes [provider]  diltiazem (CARDIZEM CD) 240 MG 24 hr capsule Take 240 mg by mouth daily.  02/03/14  Yes [provider]  fluticasone (FLONASE) 50 MCG/ACT nasal spray Place 1 spray into both nostrils daily as needed for allergies or rhinitis.   Yes [provider]  gabapentin (NEURONTIN) 300 MG capsule Take 600 mg by mouth 3 (three) times daily.    Yes [provider]  HYDROcodone-acetaminophen (NORCO/VICODIN) 5-325 MG tablet Take 1 tablet by mouth every 6 (six) hours as needed for moderate pain.   Yes [provider]  insulin aspart (NOVOLOG) 100 UNIT/ML injection Inject 50 Units into the skin 3 (three) times daily before meals. 55 base Plus sliding scale    Yes [provider]  insulin NPH (HUMULIN N,NOVOLIN N) 100 UNIT/ML injection Inject 75 Units into the skin 3 (three) times daily with meals. 75 units with breakfast; lunch 75 units; 75 units at dinner   Yes [provider]  irbesartan (AVAPRO) 300 MG tablet Take 300 mg by mouth daily.    Yes [provider]  ISOtretinoin (ACCUTANE) 40 MG capsule Take 40 mg by mouth daily.   Yes [provider]  leflunomide (ARAVA) 20 MG tablet Take 20 mg by mouth daily.   Yes [provider]  magnesium oxide (MAG-OX) 400 MG tablet Take 400 mg by mouth daily.   Yes [provider]  meclizine (ANTIVERT) 25 MG tablet Take 25 mg by mouth 3 (three) times daily as needed for dizziness.   Yes [provider]  metFORMIN (GLUCOPHAGE-XR) 500 MG 24 hr tablet Take 500 mg by mouth every evening.   Yes [provider]  pantoprazole (PROTONIX) 40 MG tablet Take 40 mg by mouth daily.   Yes [provider]  spironolactone (ALDACTONE) 25 MG tablet Take 25 mg by mouth daily. 08/27/17  Yes [provider]  topiramate (TOPAMAX) 100 MG tablet Take 100 mg by mouth at bedtime.  07/20/16  Yes [provider]  TRULICITY 2.95 MW/4.1LK SOPN Inject 0.75 mg into the skin  once a week. Patient takes on Wednesday. 08/29/15  Yes [provider]  Vitamin D, Ergocalciferol, (DRISDOL) 50000 units CAPS capsule Take 50,000 Units by mouth every 7 (seven) days.   Yes [provider]  VOLTAREN 1 % GEL Apply 1 application topically 3 (three) times daily as needed (for pain).  02/03/14  Yes [provider]  lidocaine (LIDODERM) 5 % Place 1 patch onto the skin daily. Remove & Discard patch within 12 hours or as directed by MD Patient not taking: Reported on 08/24/2017 08/27/17   Petrucelli, Glynda Jaeger, PA-C  methocarbamol (ROBAXIN) 500 MG tablet Take 2 tablets (1,000 mg total) by mouth 4 (four) times daily as needed (Pain). Patient not taking: Reported on 08/28/2017 09/04/17   Pisciotta, Elmyra Ricks, PA-C  naproxen (NAPROSYN) 500 MG tablet Take 1 tablet (500 mg total) by mouth 2 (two) times daily. Patient  not taking: Reported on 08/28/2017 08/27/17   Petrucelli, Glynda Jaeger, PA-C     Family History  Problem Relation Age of Onset  . Diabetes Mother   . Cancer Mother        breast  . Aneurysm Father        brain  . Diabetes Sister   . Cancer Brother        bone marrow cancer    Social History   Socioeconomic History  . Marital status: Divorced    Spouse name: Not on file  . Number of children: Not on file  . Years of education: Not on file  . Highest education level: Not on file  Occupational History  . Not on file  Social Needs  . Financial resource strain: Patient refused  . Food insecurity:    Worry: Patient refused    Inability: Patient refused  . Transportation needs:    Medical: Patient refused    Non-medical: Patient refused  Tobacco Use  . Smoking status: Former Smoker    Packs/day: 0.25    Years: 6.00    Pack years: 1.50    Types: Cigarettes    Last attempt to quit: 06/09/2013    Years since quitting: 4.2  . Smokeless tobacco: Never Used  Substance and Sexual Activity  . Alcohol use: No  . Drug use: No  . Sexual activity: Yes     Birth control/protection: Surgical  Lifestyle  . Physical activity:    Days per week: Patient refused    Minutes per session: Patient refused  . Stress: Patient refused  Relationships  . Social connections:    Talks on phone: Patient refused    Gets together: Patient refused    Attends religious service: Patient refused    Active member of club or organization: Patient refused    Attends meetings of clubs or organizations: Patient refused    Relationship status: Patient refused  Other Topics Concern  . Not on file  Social History Narrative  . Not on file      Review of Systems: A 12 point ROS discussed and pertinent positives are indicated in the HPI above.  All other systems are negative.  Review of Systems  Constitutional: Negative for fever.  Respiratory:       Intubated/vent    Vital Signs: BP (!) 117/58   Pulse 60   Temp 98.4 F (36.9 C) (Oral)   Resp (!) 21   Ht 5\' 2"  (1.575 m)   Wt 281 lb 4.9 oz (127.6 kg)   LMP 12/27/2013 Comment: irreggular  SpO2 100%   BMI 51.45 kg/m   Physical Exam  Pulmonary/Chest:  vent  Abdominal: Soft.  Skin: Skin is warm.  Psychiatric:  Will need consent form son- via phone  Nursing note and vitals reviewed.   Imaging: Ct Abdomen Pelvis Wo Contrast  Result Date: 09/09/2017 CLINICAL DATA:  Bacteremia EXAM: CT ABDOMEN AND PELVIS WITHOUT CONTRAST TECHNIQUE: Multidetector CT imaging of the abdomen and pelvis was performed following the standard protocol without IV contrast. COMPARISON:  None. FINDINGS: Motion degraded images. Lower chest: Small right pleural effusion. Hepatobiliary: Liver is grossly unremarkable. Gallbladder is grossly unremarkable. No intrahepatic or extrahepatic ductal dilatation. Pancreas: Within normal limits. Spleen: Within normal limits. Adrenals/Urinary Tract: Adrenal glands within normal limits. Kidneys are within normal limits. No renal calculi or hydronephrosis. Bladder is decompressed by an indwelling  Foley catheter. Stomach/Bowel: Stomach is within normal limits. No evidence of bowel obstruction. Normal appendix (series 9/image  56). Mild sigmoid diverticulosis, without evidence of diverticulitis. Vascular/Lymphatic: No evidence of abdominal aortic aneurysm. Atherosclerotic calcifications of the abdominal aorta and branch vessels. No suspicious abdominopelvic lymphadenopathy. Reproductive: Uterus is within normal limits. Bilateral ovaries are within normal limits. Other: No abdominopelvic ascites. Musculoskeletal: Degenerative changes of the lower thoracic spine. IMPRESSION: No CT findings to account for the patient's bacteremia. Small right pleural effusion. Mild sigmoid diverticulosis, without evidence of diverticulitis. Bladder decompressed by indwelling Foley catheter. Electronically Signed   By: Julian Hy M.D.   On: 09/09/2017 15:26   Dg Wrist Complete Left  Result Date: 08/29/2017 CLINICAL DATA:  Chronic low back pain.  Body aches. EXAM: LEFT WRIST - COMPLETE 3+ VIEW COMPARISON:  No recent. FINDINGS: Diffuse severe degenerative change left wrist. Carpal cysts noted most likely degenerative. Probable postsurgical changes of the trapezium. No evidence of acute fracture or dislocation. IMPRESSION: Diffuse severe degenerative change left wrist. Probable postsurgical changes of the trapezium. No acute abnormality identified. Electronically Signed   By: Marcello Moores  Register   On: 08/13/2017 10:54   Ct Head Wo Contrast  Result Date: 09/09/2017 CLINICAL DATA:  57 year old female with a history of altered mental status EXAM: CT HEAD WITHOUT CONTRAST TECHNIQUE: Contiguous axial images were obtained from the base of the skull through the vertex without intravenous contrast. COMPARISON:  None. FINDINGS: Brain: No acute intracranial hemorrhage. No midline shift or mass effect. Unremarkable appearance of the ventricles. Gray-white differentiation maintained. Vascular: Calcifications of the anterior circulation  Skull: No acute displaced fracture. Sinuses/Orbits: Trace disease within the ethmoid air cells. Maxillary sinuses, frontal sinuses, and sphenoid sinuses patent Other: None IMPRESSION: CT negative for acute intracranial abnormality. Electronically Signed   By: Corrie Mckusick D.O.   On: 09/09/2017 15:23   Ct Head Wo Contrast  Result Date: 08/20/2017 CLINICAL DATA:  Altered level of consciousness. EXAM: CT HEAD WITHOUT CONTRAST TECHNIQUE: Contiguous axial images were obtained from the base of the skull through the vertex without intravenous contrast. COMPARISON:  01/17/2017 FINDINGS: Brain: No acute intracranial abnormality. Specifically, no hemorrhage, hydrocephalus, mass lesion, acute infarction, or significant intracranial injury. Vascular: No hyperdense vessel or unexpected calcification. Skull: No acute calvarial abnormality. Sinuses/Orbits: Visualized paranasal sinuses and mastoids clear. Orbital soft tissues unremarkable. Other: None IMPRESSION: Normal study. Electronically Signed   By: Rolm Baptise M.D.   On: 08/27/2017 10:32   Mr Lumbar Spine Wo Contrast  Result Date: 09/10/2017 CLINICAL DATA:  Bacteremia. Rheumatoid arthritis on immunosuppressant. Toe and wrist septic arthritis. Back pain. EXAM: MRI LUMBAR SPINE WITHOUT CONTRAST TECHNIQUE: Multiplanar, multisequence MR imaging of the lumbar spine was performed. No intravenous contrast was administered. COMPARISON:  Abdominal CT from yesterday FINDINGS: Segmentation: 5 lumbar type vertebral bodies based on the lowest ribs. Alignment:  Trace degenerative anterolisthesis at L4-5 and L5-S1. Vertebrae: Negative for discitis or visible osteomyelitis. There is fluid signal around the right L1-2 facet joint measuring up to 23 mm. No underlying joint erosions seen yesterday but there is marrow edema today. There is no noncontrast evidence of epidural collection. Preserved foraminal fat negative for fracture. Conus medullaris and cauda equina: Conus extends to  the L1-2 level. Conus and cauda equina appear normal. Paraspinal and other soft tissues: Edematous type signal in the right more than left intrinsic back muscles, superimposed on fatty atrophy. Disc levels: T12- L1: Negative L1-L2: Acute findings at the right facet. L2-L3: Negative L3-L4: Negative L4-L5: Degenerative facet spurring with joint effusion. Trace anterolisthesis. Early endplate degeneration A1-P3:XTKWI arthropathy with spurring and trace anterolisthesis. Mild  disc bulging. Mild endplate degeneration. IMPRESSION: 1. Limited by motion and lack of contrast in this patient with acute renal failure. 2. Septic arthritis at the right L1-2 facet with 2.3 cm posterior collection. No evidence of spinal canal or foraminal abscess. 3. Degenerative changes noted above. Electronically Signed   By: Monte Fantasia M.D.   On: 09/10/2017 17:35   US Abdomen Complete  Result Date: 09/02/2017 CLINICAL DATA:  Elevated liver enzymes EXAM: ABDOMEN ULTRASOUND COMPLETE COMPARISON:  None. FINDINGS: Gallbladder: No gallstones or wall thickening visualized. No pericholecystic fluid. No sonographic Murphy sign noted by sonographer. Common bile duct: Diameter: 2 mm. No intrahepatic, common hepatic, or common bile duct dilatation. Liver: No focal lesion identified. Within normal limits in parenchymal echogenicity. Portal vein is patent on color Doppler imaging with normal direction of blood flow towards the liver. IVC: No abnormality visualized. Pancreas: No pancreatic mass or inflammatory focus. Spleen: Size and appearance within normal limits. Right Kidney: Length: 13.5 cm. Echogenicity within normal limits. No mass or hydronephrosis visualized. Left Kidney: Length: 12.1 cm. Echogenicity within normal limits. No mass or hydronephrosis visualized. Abdominal aorta: No aneurysm visualized. There is aortic atherosclerosis. Other findings: No demonstrable ascites. IMPRESSION: Aortic atherosclerosis.  Study otherwise unremarkable.  Aortic Atherosclerosis (ICD10-I70.0). Electronically Signed   By: Lowella Grip III M.D.   On: 08/13/2017 15:06   US Pelvis Limited (transabdominal Only)  Result Date: 08/09/2017 CLINICAL DATA:  Acute kidney injury EXAM: LIMITED ULTRASOUND OF PELVIS TECHNIQUE: Limited transabdominal ultrasound examination of the pelvis was performed. COMPARISON:  Ultrasound 11/20/2010 FINDINGS: Limited sonogram of the pelvis. Foley catheter in the pelvis. The urinary bladder is empty. IMPRESSION: Foley catheter in the pelvis.  Urinary bladder is empty. Electronically Signed   By: Donavan Foil M.D.   On: 08/28/2017 15:08   Mr Foot Right Wo Contrast  Result Date: 09/10/2017 CLINICAL DATA:  Great toe infection. History of rheumatoid arthritis. EXAM: MRI OF THE RIGHT FOREFOOT WITHOUT CONTRAST TECHNIQUE: Multiplanar, multisequence MR imaging of the right forefoot was performed. No intravenous contrast was administered. COMPARISON:  Right foot x-rays dated September 05, 2017. FINDINGS: Despite efforts by the technologist and patient, motion artifact is present on today's exam and could not be eliminated. This reduces exam sensitivity and specificity. Bones/Joint/Cartilage Cortical destruction and abnormal marrow edema with decreased T1 marrow signal involving the first proximal and distal phalanges surrounding the first IP joint. Prominent first IP joint effusion. Mild osteoarthritis of the first MTP joint. There are multiple small erosions involving the navicular, cuneiforms, and metatarsal bases, likely related to patient's history of rheumatoid arthritis. Ligaments The toe collateral ligaments are intact. Muscles and Tendons There is a 4.5 x 3.4 cm multiloculated area of increased T2 signal in the intrinsic muscles of the forefoot at the plantar aspect of the second metatarsal, incompletely evaluated. This appears to correspond with muscle signal on the T1 coronal images. Soft tissues Multiple small fluid collections  surrounding the first IP joint. Moderate dorsal forefoot soft tissue swelling. IMPRESSION: 1. Limited study. Septic arthritis of the first IP joint with associated osteomyelitis of the first proximal and distal phalanges. There are multiple small fluid collections surrounding the first IP joint which may represent distention of the joint space and/or small adjacent abscesses. 2. 4.5 x 3.4 cm multiloculated area of increased T2 signal in the intrinsic muscles of the forefoot at the plantar aspect of the second metatarsal, incompletely evaluated. This appears to correspond with muscle signal on the T1 coronal images and may reflect  myositis or diabetic muscle changes. Electronically Signed   By: Titus Dubin M.D.   On: 09/10/2017 17:59   Dg Chest Port 1 View  Result Date: 09/12/2017 CLINICAL DATA:  Intubation EXAM: PORTABLE CHEST 1 VIEW COMPARISON:  09/09/2017 FINDINGS: Endotracheal tube has been placed with tip measuring 3.5 cm above the carina. Enteric tube tip is off the field of view but below the left hemidiaphragm. Right central venous catheter with tip over the cavoatrial junction region. Shallow inspiration with atelectasis in the lung bases. Probable small bilateral pleural effusions. Cardiac enlargement without vascular congestion or edema. No pneumothorax. IMPRESSION: Appliances appear in satisfactory position. Cardiac enlargement. Shallow inspiration with atelectasis in the lung bases. Probable small bilateral pleural effusion. Electronically Signed   By: Lucienne Capers M.D.   On: 09/12/2017 06:56   Dg Chest Port 1 View  Result Date: 09/09/2017 CLINICAL DATA:  Altered mental status EXAM: PORTABLE CHEST 1 VIEW COMPARISON:  09/07/2017 FINDINGS: Right jugular dialysis catheter in the right atrium unchanged. Cardiac enlargement with vascular congestion unchanged. Negative for edema. Small right effusion. Mild right lower lobe airspace disease with mild progression, and may be atelectasis or  infiltrate. IMPRESSION: Pulmonary vascular congestion unchanged Mild progression of right lower lobe airspace disease Electronically Signed   By: Franchot Gallo M.D.   On: 09/09/2017 11:35   Dg Chest Port 1 View  Result Date: 09/07/2017 CLINICAL DATA:  Central line placement. EXAM: PORTABLE CHEST 1 VIEW COMPARISON:  08/29/2017 and prior radiographs FINDINGS: This is a mildly low volume film. A RIGHT IJ central venous catheter is noted with tip overlying the UPPER RIGHT atrium. There is no evidence of pneumothorax. Cardiomegaly and pulmonary vascular congestion again noted. IMPRESSION: RIGHT IJ central venous catheter with tip overlying the UPPER RIGHT atrium, in this low volume film. No pneumothorax. Cardiomegaly and pulmonary vascular congestion. Electronically Signed   By: Margarette Canada M.D.   On: 09/07/2017 14:34   Dg Chest Port 1 View  Result Date: 09/04/2017 CLINICAL DATA:  Chronic low back pain, BILATERAL arm pain, generalized body aches for several months, increased pain with movement and change in position, history of type II diabetes mellitus, asthma, hypertension, rheumatoid arthritis, former smoker EXAM: PORTABLE CHEST 1 VIEW COMPARISON:  Portable exam 0859 hours compared to 11/30/2015 FINDINGS: Enlargement of cardiac silhouette. Mediastinal contours and pulmonary vascularity normal. Lungs grossly clear. No definite infiltrate, pleural effusion or pneumothorax. IMPRESSION: Enlargement of cardiac silhouette. No acute abnormalities. Electronically Signed   By: Lavonia Dana M.D.   On: 08/12/2017 09:34   Dg Foot Complete Right  Result Date: 08/31/2017 CLINICAL DATA:  Right foot pain. Rule out osteomyelitis of great toe. EXAM: RIGHT FOOT COMPLETE - 3+ VIEW COMPARISON:  09/13/2016 FINDINGS: No bony destructive changes noted to suggest osteomyelitis. Degenerative changes at the great toe IP joint. Mild soft tissue swelling along the dorsum of the foot. IMPRESSION: No visible radiographic changes of  osteomyelitis. No acute bony abnormality. Electronically Signed   By: Rolm Baptise M.D.   On: 08/23/2017 10:51   Dg Fluoro Guided Needle Plc Aspiration/injection Loc  Result Date: 09/09/2017 CLINICAL DATA:  Staph aureus bacteremia. Left wrist soft tissue swelling EXAM: Left WRIST  INJECTION UNDER FLUOROSCOPY FLUOROSCOPY TIME:  Fluoroscopy Time:  0 minutes 48 seconds Radiation Exposure Index (if provided by the fluoroscopic device): Number of Acquired Spot Images: 0 PROCEDURE: Diffuse soft tissue swelling around the wrist. Overlying skin prepped with Betadine, draped in the usual sterile fashion, and infiltrated locally with buffered  Lidocaine. 21 gauge spinal needle advanced into the radiocarpal joint without difficulty. 0.5 mL of normal appearing joint fluid was aspirated and sent to the laboratory. Additional repositioning of the needle did not yield any further fluid. 1 mL of Isovue contrast injected into the needle confirming intra-articular location. IMPRESSION: Successful left wrist arthrocentesis. No significant effusion. 0.5 mL of normal appearing joint fluid sent to the laboratory for analysis. Electronically Signed   By: Franchot Gallo M.D.   On: 09/09/2017 15:08    Labs:  CBC: Recent Labs    09/09/17 0432 09/10/17 0434 10/07/2017 0409 09/12/17 0435  WBC 23.6* 25.3* 20.3* 22.5*  HGB 8.6* 9.2* 8.0* 6.5*  HCT 27.2* 28.0* 24.5* 19.6*  PLT 347 352 266 272    COAGS: Recent Labs    08/27/2017 1531 09/09/17 1004 09/12/17 0435  INR 1.45 1.69 1.52  APTT 36  --   --     BMP: Recent Labs    09/09/17 1600 09/10/17 0436 09/10/17 1806 09/27/2017 1752  NA 136 135 135 136  K 3.7 3.9 3.5 4.3  CL 100* 98* 101 102  CO2 22 22 22 22   GLUCOSE 144* 187* 215* 214*  BUN 31* 24* 25* 28*  CALCIUM 7.9* 8.4* 8.1* 8.1*  CREATININE 2.47* 2.31* 2.32* 2.24*  GFRNONAA 21* 23* 23* 23*  GFRAA 24* 26* 26* 27*    LIVER FUNCTION TESTS: Recent Labs    09/09/17 0432 09/09/17 1004  09/10/17 1806  10/01/2017 0409 10/05/2017 1752 09/12/17 0643  BILITOT 5.4* 5.4*  --   --  5.5*  --  3.8*  AST 102* 98*  --   --  67*  --  49*  ALT 30 30  --   --  28  --  24  ALKPHOS 526* 541*  --   --  527*  --  417*  PROT 6.9 6.4*  --   --  6.1*  --  5.8*  ALBUMIN 1.5*  1.5* 1.5*   < > 1.4* 1.4* 1.8* 1.6*   < > = values in this interval not displayed.    TUMOR MARKERS: No results for input(s): AFPTM, CEA, CA199, CHROMGRNA in the last 8760 hours.  Assessment and Plan:  Rh arthritis; MSSA bacteremia L1-2 fluid collection Scheduled for aspiration Will discuss benefits and risks of procedure with Son asap Dr Laurence Ferrari to decide if technically this procedure can be performed  Thank you for this interesting consult.  I greatly enjoyed meeting PEIGHTON MEHRA and look forward to participating in their care.  A copy of this report was sent to the requesting provider on this date.  Electronically Signed: Lavonia Drafts, PA-C 09/12/2017, 10:07 AM   I spent a total of 40 Minutes    in face to face in clinical consultation, greater than 50% of which was counseling/coordinating care for L1-2 fluid collections aspiration

## 2017-09-12 NOTE — Progress Notes (Signed)
Pharmacy Antibiotic Note  MAYLINE DRAGON is a 57 y.o. female admitted on 08/14/2017 with MSSA bacteremia.  Pharmacy has been consulted for daptomycin dosing. Pt now s/p OR with I&D of R/L hand and R foot. TEE completed in OR could not exclude endocarditis. Repeat cultures from 4/4 pending. CVVHD off as of 4/4 afternoon, minimal UOP, planning to transition to iHD once more stable per renal.  Plan: -Continue daptomycin 8mg /kg q48h -Follow repeat cultures, renal plans -CK q72h  Height: 5\' 2"  (157.5 cm) Weight: 281 lb 4.9 oz (127.6 kg) IBW/kg (Calculated) : 50.1  Temp (24hrs), Avg:98.1 F (36.7 C), Min:97.5 F (36.4 C), Max:98.4 F (36.9 C)  Recent Labs  Lab 08/16/2017 1526  08/19/2017 2001  09/08/17 1325 09/09/17 0432 09/09/17 1004 09/09/17 1241 09/09/17 1600 09/10/17 0434 09/10/17 0436 09/10/17 1806 09/12/2017 0409 09/19/2017 1752 09/12/17 0435  WBC  --   --   --    < > 24.1* 23.6*  --   --   --  25.3*  --   --  20.3*  --  22.5*  CREATININE  --    < > 3.91*   < >  --  2.55*  --   --  2.47*  --  2.31* 2.32*  --  2.24*  --   LATICACIDVEN 2.1*  --  1.9  --   --   --  1.5 1.2  --   --   --   --   --   --   --    < > = values in this interval not displayed.    Estimated Creatinine Clearance: 36.3 mL/min (A) (by C-G formula based on SCr of 2.24 mg/dL (H)).    Allergies  Allergen Reactions  . Cefuroxime Axetil Other (See Comments)    Blistering wounds per family  . Cephalosporins Other (See Comments)    Blistering wounds as an adult per family  . Bactrim [Sulfamethoxazole-Trimethoprim] Hives  . Iohexol Itching and Rash     Code: RASH, Desc: HAD ITCHING AND A RASH ABOUT ONE HOUR AFTER RETURNING HOME FROM THE CT, Onset Date: [id number]   . Lisinopril Cough  . Penicillins Hives  . Sulfa Antibiotics Hives    Antimicrobials this admission: 3/29 Vanc>>3/30 3/30 Aztreonam x 1  3/30 Dapto >>   Dose adjustments this admission: none  Microbiology results: 4/4 BCx: pending 3/31  BCx: 1o2 MSSA 3/29 BCx 4/4: MSSA  3/29 UCx 50k MSSA    Thank you for allowing pharmacy to be a part of this patient's care.  Arrie Senate, PharmD, BCPS PGY-2 Cardiology Pharmacy Resident Pager: [phone] 09/12/2017

## 2017-09-12 NOTE — Progress Notes (Signed)
[facility] Progress Note Patient Name: Jessica Howard DOB: 03-Apr-1961 MRN: [id number]   Date of Service  09/12/2017  HPI/Events of Note  Patient is intubated and requires stress ulcer prophylaxis  eICU Interventions  SUP ordered.        [name] [name] 09/12/2017, 4:43 AM

## 2017-09-12 NOTE — Progress Notes (Signed)
Patient intubated and sister at the bedside.   Right great toe dressing draining.  Dressing intact Re-enforced the dressing with ABD pads and ace wrap Plan for OR on Sunday

## 2017-09-12 NOTE — Procedures (Signed)
Interventional Radiology Procedure Note  Procedure: CT guided aspiration right L1-L2 facet yielding <1 mL purulent fluid  Complications: None  Estimated Blood Loss: None  Recommendations: Cultures sent  Signed,  Criselda Peaches, MD

## 2017-09-12 NOTE — Progress Notes (Signed)
SLP Cancellation Note  Patient Details Name: Jessica Howard MRN: [id number] DOB: 14-Jun-1960   Cancelled treatment:       Reason Eval/Treat Not Completed: Medical issues which prohibited therapy (intubated)   Germain Osgood 09/12/2017, 8:13 AM  Germain Osgood, M.A. CCC-SLP [phone]

## 2017-09-12 NOTE — Progress Notes (Signed)
S: intubated since TEE DCCV and surgery on infected toe, hand, and wrist. O:BP (!) 97/58   Pulse 61   Temp 98.4 F (36.9 C) (Oral)   Resp (!) 25   Ht 5' 2"  (1.575 m)   Wt 127.6 kg (281 lb 4.9 oz)   LMP 12/27/2013 Comment: irreggular  SpO2 100%   BMI 51.45 kg/m   Intake/Output Summary (Last 24 hours) at 09/12/2017 1146 Last data filed at 09/12/2017 1100 Gross per 24 hour  Intake 2650.82 ml  Output 116 ml  Net 2534.82 ml   Intake/Output: I/O last 3 completed shifts: In: 2923.2 [I.V.:2573.2; Other:10; NG/GT:90; IV Piggyback:250] Out: 6948 [Urine:51; Drains:50; NIOEV:0350]  Intake/Output this shift:  Total I/O In: 523.1 [I.V.:208.1; Blood:315] Out: 15 [Urine:15] Weight change: -0.261 kg (-9.2 oz) KXF:GHWEXHBZJ/IRCVELF CVS: RRR Resp: occ rhonchi Abd: +BS, soft Ext: bleeding from right great toe surgical site, + edema  Recent Labs  Lab 08/25/2017 2001 09/06/17 0310 09/06/17 2349  09/08/17 0543 09/09/17 0432 09/09/17 1004 09/09/17 1600 09/10/17 0436 09/10/17 1806 09/28/2017 0409 10/05/2017 1752 09/12/17 0643  NA 135 137 139   < > 138 136  --  136 135 135  --  136 134*  K 6.0* 5.6* 5.0   < > 4.2 4.0  --  3.7 3.9 3.5  --  4.3 4.1  CL 104 105 108   < > 102 99*  --  100* 98* 101  --  102 102  CO2 15* 16* 15*   < > 20* 22  --  22 22 22   --  22 20*  GLUCOSE 275* 190* 139*   < > 75 150*  --  144* 187* 215*  --  214* 175*  BUN 69* 74* 83*   < > 55* 32*  --  31* 24* 25*  --  28* 40*  CREATININE 3.91* 4.34* 5.92*   < > 4.28* 2.55*  --  2.47* 2.31* 2.32*  --  2.24* 3.02*  ALBUMIN 2.1* 2.0* 1.7*   < > 1.6* 1.5*  1.5* 1.5* 1.4* 1.5* 1.4* 1.4* 1.8* 1.6*  1.6*  CALCIUM 7.0* 7.1* 6.5*   < > 7.0* 8.1*  --  7.9* 8.4* 8.1*  --  8.1* 7.9*  PHOS  --   --   --    < > 5.6* 4.7*  --  4.3 4.1 4.3  --  4.2 5.1*  AST 87* 89* 114*  --   --  102* 98*  --   --   --  67*  --  49*  ALT 34 32 34  --   --  30 30  --   --   --  28  --  24   < > = values in this interval not displayed.   Liver Function  Tests: Recent Labs  Lab 09/09/17 1004  10/07/2017 0409 09/10/2017 1752 09/12/17 0643  AST 98*  --  67*  --  49*  ALT 30  --  28  --  24  ALKPHOS 541*  --  527*  --  417*  BILITOT 5.4*  --  5.5*  --  3.8*  PROT 6.4*  --  6.1*  --  5.8*  ALBUMIN 1.5*   < > 1.4* 1.8* 1.6*  1.6*   < > = values in this interval not displayed.   No results for input(s): LIPASE, AMYLASE in the last 168 hours. Recent Labs  Lab 09/06/17 0310 09/09/17 1004  AMMONIA 101* 44*   CBC:  Recent Labs  Lab 09/08/17 1325 09/09/17 0432 09/10/17 0434 10/01/2017 0409 09/12/17 0435 09/12/17 1039  WBC 24.1* 23.6* 25.3* 20.3* 22.5*  --   NEUTROABS 21.7*  --   --   --   --   --   HGB 8.1* 8.6* 9.2* 8.0* 6.5* 7.4*  HCT 25.7* 27.2* 28.0* 24.5* 19.6* 22.1*  MCV 66.6* 65.9* 65.4* 65.2* 65.1*  --   PLT 344 347 352 266 272  --    Cardiac Enzymes: Recent Labs  Lab 09/06/17 1030 09/10/17 0434  CKTOTAL 233 131   CBG: Recent Labs  Lab 09/24/2017 1940 09/15/2017 2340 09/12/17 0306 09/12/17 0804 09/12/17 1128  GLUCAP 203* 198* 197* 170* 165*    Iron Studies: No results for input(s): IRON, TIBC, TRANSFERRIN, FERRITIN in the last 72 hours. Studies/Results: Mr Lumbar Spine Wo Contrast  Result Date: 09/10/2017 CLINICAL DATA:  Bacteremia. Rheumatoid arthritis on immunosuppressant. Toe and wrist septic arthritis. Back pain. EXAM: MRI LUMBAR SPINE WITHOUT CONTRAST TECHNIQUE: Multiplanar, multisequence MR imaging of the lumbar spine was performed. No intravenous contrast was administered. COMPARISON:  Abdominal CT from yesterday FINDINGS: Segmentation: 5 lumbar type vertebral bodies based on the lowest ribs. Alignment:  Trace degenerative anterolisthesis at L4-5 and L5-S1. Vertebrae: Negative for discitis or visible osteomyelitis. There is fluid signal around the right L1-2 facet joint measuring up to 23 mm. No underlying joint erosions seen yesterday but there is marrow edema today. There is no noncontrast evidence of epidural  collection. Preserved foraminal fat negative for fracture. Conus medullaris and cauda equina: Conus extends to the L1-2 level. Conus and cauda equina appear normal. Paraspinal and other soft tissues: Edematous type signal in the right more than left intrinsic back muscles, superimposed on fatty atrophy. Disc levels: T12- L1: Negative L1-L2: Acute findings at the right facet. L2-L3: Negative L3-L4: Negative L4-L5: Degenerative facet spurring with joint effusion. Trace anterolisthesis. Early endplate degeneration [id number] arthropathy with spurring and trace anterolisthesis. Mild disc bulging. Mild endplate degeneration. IMPRESSION: 1. Limited by motion and lack of contrast in this patient with acute renal failure. 2. Septic arthritis at the right L1-2 facet with 2.3 cm posterior collection. No evidence of spinal canal or foraminal abscess. 3. Degenerative changes noted above. Electronically Signed   By: Monte Fantasia M.D.   On: 09/10/2017 17:35   Mr Foot Right Wo Contrast  Result Date: 09/10/2017 CLINICAL DATA:  Great toe infection. History of rheumatoid arthritis. EXAM: MRI OF THE RIGHT FOREFOOT WITHOUT CONTRAST TECHNIQUE: Multiplanar, multisequence MR imaging of the right forefoot was performed. No intravenous contrast was administered. COMPARISON:  Right foot x-rays dated September 05, 2017. FINDINGS: Despite efforts by the technologist and patient, motion artifact is present on today's exam and could not be eliminated. This reduces exam sensitivity and specificity. Bones/Joint/Cartilage Cortical destruction and abnormal marrow edema with decreased T1 marrow signal involving the first proximal and distal phalanges surrounding the first IP joint. Prominent first IP joint effusion. Mild osteoarthritis of the first MTP joint. There are multiple small erosions involving the navicular, cuneiforms, and metatarsal bases, likely related to patient's history of rheumatoid arthritis. Ligaments The toe collateral  ligaments are intact. Muscles and Tendons There is a 4.5 x 3.4 cm multiloculated area of increased T2 signal in the intrinsic muscles of the forefoot at the plantar aspect of the second metatarsal, incompletely evaluated. This appears to correspond with muscle signal on the T1 coronal images. Soft tissues Multiple small fluid collections surrounding the first IP joint. Moderate dorsal forefoot soft  tissue swelling. IMPRESSION: 1. Limited study. Septic arthritis of the first IP joint with associated osteomyelitis of the first proximal and distal phalanges. There are multiple small fluid collections surrounding the first IP joint which may represent distention of the joint space and/or small adjacent abscesses. 2. 4.5 x 3.4 cm multiloculated area of increased T2 signal in the intrinsic muscles of the forefoot at the plantar aspect of the second metatarsal, incompletely evaluated. This appears to correspond with muscle signal on the T1 coronal images and may reflect myositis or diabetic muscle changes. Electronically Signed   By: Titus Dubin M.D.   On: 09/10/2017 17:59   Dg Chest Port 1 View  Result Date: 09/12/2017 CLINICAL DATA:  Intubation EXAM: PORTABLE CHEST 1 VIEW COMPARISON:  09/09/2017 FINDINGS: Endotracheal tube has been placed with tip measuring 3.5 cm above the carina. Enteric tube tip is off the field of view but below the left hemidiaphragm. Right central venous catheter with tip over the cavoatrial junction region. Shallow inspiration with atelectasis in the lung bases. Probable small bilateral pleural effusions. Cardiac enlargement without vascular congestion or edema. No pneumothorax. IMPRESSION: Appliances appear in satisfactory position. Cardiac enlargement. Shallow inspiration with atelectasis in the lung bases. Probable small bilateral pleural effusion. Electronically Signed   By: Lucienne Capers M.D.   On: 09/12/2017 06:56   . chlorhexidine gluconate (MEDLINE KIT)  15 mL Mouth Rinse BID   . Chlorhexidine Gluconate Cloth  6 each Topical Daily  . docusate  100 mg Per Tube BID  . insulin aspart  0-15 Units Subcutaneous Q4H  . lactulose  10 g Oral Daily  . mouth rinse  15 mL Mouth Rinse QID  . pantoprazole (PROTONIX) IV  40 mg Intravenous Q24H  . sennosides  5 mL Per Tube Daily  . sodium chloride flush  3 mL Intravenous Q12H    BMET    Component Value Date/Time   NA 134 (L) 09/12/2017 0643   NA 142 03/06/2017 0929   K 4.1 09/12/2017 0643   K 4.1 03/06/2017 0929   CL 102 09/12/2017 0643   CL 101 02/13/2012 0850   CO2 20 (L) 09/12/2017 0643   CO2 23 03/06/2017 0929   GLUCOSE 175 (H) 09/12/2017 0643   GLUCOSE 129 03/06/2017 0929   GLUCOSE 529 (H) 02/13/2012 0850   BUN 40 (H) 09/12/2017 0643   BUN 17.1 03/06/2017 0929   CREATININE 3.02 (H) 09/12/2017 0643   CREATININE 0.8 03/06/2017 0929   CALCIUM 7.9 (L) 09/12/2017 0643   CALCIUM 9.6 03/06/2017 0929   GFRNONAA 16 (L) 09/12/2017 0643   GFRAA 19 (L) 09/12/2017 0643   CBC    Component Value Date/Time   WBC 22.5 (H) 09/12/2017 0435   RBC 3.01 (L) 09/12/2017 0435   HGB 7.4 (L) 09/12/2017 1039   HGB 9.4 (L) 05/16/2017 0937   HCT 22.1 (L) 09/12/2017 1039   HCT 30.4 (L) 05/16/2017 0937   PLT 272 09/12/2017 0435   PLT 266 05/16/2017 0937   MCV 65.1 (L) 09/12/2017 0435   MCV 64.4 (L) 05/16/2017 0937   MCH 21.6 (L) 09/12/2017 0435   MCHC 33.2 09/12/2017 0435   RDW 23.9 (H) 09/12/2017 0435   RDW 17.4 (H) 05/16/2017 0937   LYMPHSABS 1.2 09/08/2017 1325   LYMPHSABS 3.1 05/16/2017 0937   MONOABS 1.2 (H) 09/08/2017 1325   MONOABS 0.8 05/16/2017 0937   EOSABS 0.0 09/08/2017 1325   EOSABS 0.1 05/16/2017 0937   BASOSABS 0.0 09/08/2017 1325   BASOSABS 0.0  05/16/2017 0937     Assessment/Plan:  1. ARF, oliguric- presumably due to ischemic ATN in setting of sepsis and hypotension, however pt with evidence of disseminated MSSA septicemia with osler's node and splinter hemorrhages. Started on CVVHD 09/07/17 and  tolerating it well and was taken off for surgery 10/01/2017 but now given tachycardia and decline in clinical condition will restart CVVHD 1. Will plan on restarting CVVHD today given events over the last 24 hours and dropping BP and intubation.   2. MSSA septicemia with metastatic infectionseeding of joints/spine, osteo of right great toe. ID following and TEE with possible MV endocarditis but no vegetations seen.  On Daptomycinper ID. Other workup of infected toe underway and ortho following and planning on further debridement Sunday. wound vac in place 3. Tachycardia/A fib with RVR despite amiodarone drip. Appreciate Cardiology consult and s/p DCCV yesterday. 4. Abnormal LFT's- ?shock liver or direct involvement of infection. Liver US negative.  5. Anemia of acute illness- follow and transfuse prn 6. Vascular access- RIJ tdc placed 09/07/17 7. Rheumatoid arthritis 8. Thalassemia 9. Hypoalbuminemia- due to sepsis 10. Disposition- remains critically ill    Donetta Potts, MD [facility] [phone]

## 2017-09-12 NOTE — Progress Notes (Signed)
Patient intubated, on pressors.  The right lower extremity dressing is had some drainage, they have stopped the heparin.  The right upper extremity dressing is intact, and has a Penrose drain.  Left upper extremity wound VAC is functioning.  Impression: Systemic sepsis with seeding of the right great toe, right thumb and hand and index finger, and left wrist and forearm and hand.  Plan continue with dressing care, she also has an abscess around her L1/L2 facet, I believe neurosurgery is planning on interventional radiology drainage of that sometime soon.  I am planning for a repeat washout of her right upper extremity, probable right great toe amputation, and repeat washout of left upper extremity on Sunday.  I discussed this with the family and they are giving this consideration, particularly the amputation, but currently were inclined towards removing the great toe.  Plan for repeat surgery Sunday if medically stable.

## 2017-09-12 NOTE — Care Management Note (Signed)
Case Management Note  Patient Details  Name: Jessica Howard MRN: [id number] Date of Birth: 1960/10/27  Subjective/Objective:      Pt admitted with MSSA bactermia - suspicion of endocarditis           Action/Plan:  PTA from home.  Pt is on ventilator and CRRT.     Expected Discharge Date:                  Expected Discharge Plan:     In-House Referral:  Clinical Social Work  Discharge planning Services  CM Consult  Post Acute Care Choice:    Choice offered to:     DME Arranged:    DME Agency:     HH Arranged:    HH Agency:     Status of Service:     If discussed at [employer], dates discussed:    Additional Comments:  Maryclare Labrador, RN 09/12/2017, 3:57 PM

## 2017-09-12 NOTE — Progress Notes (Signed)
Patient ID: Jessica Howard, female   DOB: 10-18-1960, 57 y.o.   MRN: [id number]   ADDENDUM:  I have spoken to [facility] via phone: Risks and benefits discussed with the patient's son including, but not limited to bleeding, infection, damage to adjacent structures or low yield requiring additional tests.  All of his questions were answered, he is agreeable to proceed. Consent signed and in chart.

## 2017-09-12 NOTE — Anesthesia Postprocedure Evaluation (Signed)
Anesthesia Post Note  Patient: Jessica Howard  Procedure(s) Performed: IRRIGATION AND DEBRIDEMENT RIGHT GREAT TOE AND BILATERAL WRISTS (Bilateral ) TRANSESOPHAGEAL ECHOCARDIOGRAM (TEE) CARDIOVERSION     Patient location during evaluation: SICU Anesthesia Type: General Level of consciousness: sedated Pain management: pain level controlled Vital Signs Assessment: post-procedure vital signs reviewed and stable Respiratory status: patient remains intubated per anesthesia plan Cardiovascular status: stable Postop Assessment: no apparent nausea or vomiting Anesthetic complications: no    Last Vitals:  Vitals:   09/12/17 0830 09/12/17 0850  BP: 104/75   Pulse: (!) 59 (!) 59  Resp: (!) 22 (!) 21  Temp:  36.9 C  SpO2: 100% 100%    Last Pain:  Vitals:   09/12/17 0850  TempSrc: Oral  PainSc:                  Morven S

## 2017-09-12 NOTE — Progress Notes (Signed)
INFECTIOUS DISEASE PROGRESS NOTE  ID: Jessica Howard is a 57 y.o. female with  Principal Problem:   MSSA bacteremia Active Problems:   Thalassemia   Rheumatoid arthritis ([place])   Morbid obesity ([place])   Acute back pain   Essential hypertension   Diabetes ([place])   Dyslipidemia   Sepsis ([place])   Acute kidney injury ([place])   Immunosuppression due to drug therapy   Rheumatoid arthritis(714.0)   Septic joint of left wrist ([place])   Osteomyelitis of great toe of right foot ([place])   Hypertension   Hyperlipidemia   Acute hearing loss of left ear   Diabetes mellitus, insulin dependent (IDDM), controlled (HCC)   Beta thalassemia (HCC)   Symptomatic anemia   Elevated liver enzymes   Hyperbilirubinemia   Altered mental status, unspecified   Atrial flutter ([place])   Infection of right hand  Subjective: On vent post-op Multiple I & D yesterday  Abtx:  Anti-infectives (From admission, onward)   Start     Dose/Rate Route Frequency Ordered Stop   09/08/17 1600  DAPTOmycin (CUBICIN) 1,000 mg in sodium chloride 0.9 % IVPB     1,000 mg 240 mL/hr over 30 Minutes Intravenous Every 48 hours 09/07/17 1019     09/07/17 1000  vancomycin (VANCOCIN) 1,500 mg in sodium chloride 0.9 % 500 mL IVPB  Status:  Discontinued     1,500 mg 250 mL/hr over 120 Minutes Intravenous Every 48 hours 08/18/2017 0906 09/06/17 0935   09/06/17 1600  DAPTOmycin (CUBICIN) 984 mg in sodium chloride 0.9 % IVPB  Status:  Discontinued     8 mg/kg  123 kg 239.4 mL/hr over 30 Minutes Intravenous Every 48 hours 09/06/17 1436 09/07/17 1019   09/06/17 1000  aztreonam (AZACTAM) 0.5 g in dextrose 5 % 50 mL IVPB  Status:  Discontinued     0.5 g 100 mL/hr over 30 Minutes Intravenous Every 8 hours 09/06/17 0943 09/06/17 1223   08/11/2017 2000  metroNIDAZOLE (FLAGYL) IVPB 500 mg  Status:  Discontinued     500 mg 100 mL/hr over 60 Minutes Intravenous Every 8 hours 08/31/2017 1343 09/06/17 0028   08/26/2017 0900  aztreonam (AZACTAM) 2 g in  sodium chloride 0.9 % 100 mL IVPB  Status:  Discontinued     2 g 200 mL/hr over 30 Minutes Intravenous Every 8 hours 09/03/2017 0853 09/06/17 0028   08/27/2017 0900  metroNIDAZOLE (FLAGYL) IVPB 500 mg     500 mg 100 mL/hr over 60 Minutes Intravenous  Once 08/26/2017 0853 08/14/2017 1035   08/16/2017 0830  piperacillin-tazobactam (ZOSYN) IVPB 3.375 g  Status:  Discontinued     3.375 g 100 mL/hr over 30 Minutes Intravenous  Once 08/18/2017 0816 09/02/2017 0836   08/15/2017 0830  vancomycin (VANCOCIN) IVPB 1000 mg/200 mL premix  Status:  Discontinued     1,000 mg 200 mL/hr over 60 Minutes Intravenous  Once 08/15/2017 0816 09/03/2017 0822   08/15/2017 0830  vancomycin (VANCOCIN) 2,000 mg in sodium chloride 0.9 % 500 mL IVPB     2,000 mg 250 mL/hr over 120 Minutes Intravenous  Once 08/15/2017 0822 09/07/2017 1342      Medications:  Scheduled: . chlorhexidine gluconate (MEDLINE KIT)  15 mL Mouth Rinse BID  . Chlorhexidine Gluconate Cloth  6 each Topical Daily  . docusate  100 mg Per Tube BID  . insulin aspart  0-15 Units Subcutaneous Q4H  . lactulose  10 g Oral Daily  . mouth rinse  15 mL Mouth Rinse  QID  . pantoprazole (PROTONIX) IV  40 mg Intravenous Q24H  . sennosides  5 mL Per Tube Daily  . sodium chloride flush  3 mL Intravenous Q12H    Objective: Vital signs in last 24 hours: Temp:  [97.5 F (36.4 C)-98.4 F (36.9 C)] 98.4 F (36.9 C) (04/05 0850) Pulse Rate:  [55-130] 59 (04/05 0850) Resp:  [15-38] 21 (04/05 0850) BP: (55-187)/(34-112) 104/75 (04/05 0830) SpO2:  [90 %-100 %] 100 % (04/05 0850) FiO2 (%):  [40 %] 40 % (04/05 0800) Weight:  [124.7 kg (275 lb)-127.6 kg (281 lb 4.9 oz)] 127.6 kg (281 lb 4.9 oz) (04/05 0630)   General appearance: no distress Resp: diminished breath sounds anterior - bilateral Cardio: regular rate and rhythm GI: normal findings: bowel sounds normal and soft, non-tender Incision/Wound: R great toe dressed, bleeding.     Lab Results Recent Labs    09/10/17 1806  09/26/2017 0409 09/17/2017 1752 09/12/17 0435  WBC  --  20.3*  --  22.5*  HGB  --  8.0*  --  6.5*  HCT  --  24.5*  --  19.6*  NA 135  --  136  --   K 3.5  --  4.3  --   CL 101  --  102  --   CO2 22  --  22  --   BUN 25*  --  28*  --   CREATININE 2.32*  --  2.24*  --    Liver Panel Recent Labs    10/07/2017 0409 09/30/2017 1752 09/12/17 0643  PROT 6.1*  --  5.8*  ALBUMIN 1.4* 1.8* 1.6*  AST 67*  --  49*  ALT 28  --  24  ALKPHOS 527*  --  417*  BILITOT 5.5*  --  3.8*  BILIDIR 3.5*  --  2.4*  IBILI 2.0*  --  1.4*   Sedimentation Rate No results for input(s): ESRSEDRATE in the last 72 hours. C-Reactive Protein No results for input(s): CRP in the last 72 hours.  Microbiology: Recent Results (from the past 240 hour(s))  Blood Culture (routine x 2)     Status: Abnormal   Collection Time: 09/03/2017  9:50 AM  Result Value Ref Range Status   Specimen Description BLOOD RIGHT HAND  Final   Special Requests   Final    BOTTLES DRAWN AEROBIC AND ANAEROBIC Blood Culture adequate volume   Culture  Setup Time   Final    GRAM POSITIVE COCCI IN BOTH AEROBIC AND ANAEROBIC BOTTLES CRITICAL RESULT CALLED TO, READ BACK BY AND VERIFIED WITH: G ABBOTT 09/06/17 0006 JDW    Culture (A)  Final    STAPHYLOCOCCUS AUREUS SUSCEPTIBILITIES PERFORMED ON PREVIOUS CULTURE WITHIN THE LAST 5 DAYS. Performed at [facility], [place] [address], [place], [place] [address]    Report Status 09/08/2017 FINAL  Final  Blood Culture (routine x 2)     Status: Abnormal   Collection Time: 08/26/2017  1:54 PM  Result Value Ref Range Status   Specimen Description BLOOD RIGHT FOREARM  Final   Special Requests   Final    BOTTLES DRAWN AEROBIC AND ANAEROBIC Blood Culture adequate volume   Culture  Setup Time   Final    GRAM POSITIVE COCCI IN BOTH AEROBIC AND ANAEROBIC BOTTLES CRITICAL RESULT CALLED TO, READ BACK BY AND VERIFIED WITH: Denton Brick PHARMD 09/06/17 0006 JDW Performed at [facility], [place] [address], [place], [place] [address]    Culture  STAPHYLOCOCCUS AUREUS (A)  Final   Report Status 09/08/2017 FINAL  Final   Organism ID, Bacteria STAPHYLOCOCCUS AUREUS  Final      Susceptibility   Staphylococcus aureus - MIC*    CIPROFLOXACIN <=0.5 SENSITIVE Sensitive     ERYTHROMYCIN <=0.25 SENSITIVE Sensitive     GENTAMICIN <=0.5 SENSITIVE Sensitive     OXACILLIN 0.5 SENSITIVE Sensitive     TETRACYCLINE <=1 SENSITIVE Sensitive     VANCOMYCIN 1 SENSITIVE Sensitive     TRIMETH/SULFA <=10 SENSITIVE Sensitive     CLINDAMYCIN <=0.25 SENSITIVE Sensitive     RIFAMPIN <=0.5 SENSITIVE Sensitive     Inducible Clindamycin NEGATIVE Sensitive     * STAPHYLOCOCCUS AUREUS  Blood Culture ID Panel (Reflexed)     Status: Abnormal   Collection Time: 08/25/2017  1:54 PM  Result Value Ref Range Status   Enterococcus species NOT DETECTED NOT DETECTED Final   Listeria monocytogenes NOT DETECTED NOT DETECTED Final   Staphylococcus species DETECTED (A) NOT DETECTED Final    Comment: CRITICAL RESULT CALLED TO, READ BACK BY AND VERIFIED WITH: G ABBOTT PHARMD 09/06/17 0006 JDW    Staphylococcus aureus DETECTED (A) NOT DETECTED Final    Comment: Methicillin (oxacillin) susceptible Staphylococcus aureus (MSSA). Preferred therapy is anti staphylococcal beta lactam antibiotic (Cefazolin or Nafcillin), unless clinically contraindicated. CRITICAL RESULT CALLED TO, READ BACK BY AND VERIFIED WITH: G ABBOTT PHARMD 09/06/17 0006 JDW    Methicillin resistance NOT DETECTED NOT DETECTED Final   Streptococcus species NOT DETECTED NOT DETECTED Final   Streptococcus agalactiae NOT DETECTED NOT DETECTED Final   Streptococcus pneumoniae NOT DETECTED NOT DETECTED Final   Streptococcus pyogenes NOT DETECTED NOT DETECTED Final   Acinetobacter baumannii NOT DETECTED NOT DETECTED Final   Enterobacteriaceae species NOT DETECTED NOT DETECTED Final   Enterobacter cloacae complex NOT DETECTED NOT DETECTED Final   Escherichia coli NOT DETECTED  NOT DETECTED Final   Klebsiella oxytoca NOT DETECTED NOT DETECTED Final   Klebsiella pneumoniae NOT DETECTED NOT DETECTED Final   Proteus species NOT DETECTED NOT DETECTED Final   Serratia marcescens NOT DETECTED NOT DETECTED Final   Haemophilus influenzae NOT DETECTED NOT DETECTED Final   Neisseria meningitidis NOT DETECTED NOT DETECTED Final   Pseudomonas aeruginosa NOT DETECTED NOT DETECTED Final   Candida albicans NOT DETECTED NOT DETECTED Final   Candida glabrata NOT DETECTED NOT DETECTED Final   Candida krusei NOT DETECTED NOT DETECTED Final   Candida parapsilosis NOT DETECTED NOT DETECTED Final   Candida tropicalis NOT DETECTED NOT DETECTED Final    Comment: Performed at [facility], [place] [address], [place], [place] [address]  Urine culture     Status: Abnormal   Collection Time: 08/31/2017  3:56 PM  Result Value Ref Range Status   Specimen Description URINE, RANDOM  Final   Special Requests   Final    NONE Performed at [facility], [place] [address], [place], [place] [address]    Culture 50,000 COLONIES/mL STAPHYLOCOCCUS AUREUS (A)  Final   Report Status 09/07/2017 FINAL  Final   Organism ID, Bacteria STAPHYLOCOCCUS AUREUS (A)  Final      Susceptibility   Staphylococcus aureus - MIC*    CIPROFLOXACIN <=0.5 SENSITIVE Sensitive     GENTAMICIN <=0.5 SENSITIVE Sensitive     NITROFURANTOIN 32 SENSITIVE Sensitive     OXACILLIN 0.5 SENSITIVE Sensitive     TETRACYCLINE <=1 SENSITIVE Sensitive     VANCOMYCIN 1 SENSITIVE Sensitive     TRIMETH/SULFA <=  10 SENSITIVE Sensitive     CLINDAMYCIN <=0.25 SENSITIVE Sensitive     RIFAMPIN <=0.5 SENSITIVE Sensitive     Inducible Clindamycin NEGATIVE Sensitive     * 50,000 COLONIES/mL STAPHYLOCOCCUS AUREUS  MRSA PCR Screening     Status: None   Collection Time: 09/06/17  5:20 AM  Result Value Ref Range Status   MRSA by PCR NEGATIVE NEGATIVE Final    Comment:        The GeneXpert MRSA Assay (FDA approved for NASAL  specimens only), is one component of a comprehensive MRSA colonization surveillance program. It is not intended to diagnose MRSA infection nor to guide or monitor treatment for MRSA infections. Performed at [facility], [place] [address], [place], [place] [address]   Culture, blood (routine x 2)     Status: Abnormal   Collection Time: 09/07/17  7:09 AM  Result Value Ref Range Status   Specimen Description BLOOD RIGHT HAND  Final   Special Requests AEROBIC BOTTLE ONLY Blood Culture adequate volume  Final   Culture  Setup Time   Final    GRAM POSITIVE COCCI AEROBIC BOTTLE ONLY CRITICAL VALUE NOTED.  VALUE IS CONSISTENT WITH PREVIOUSLY REPORTED AND CALLED VALUE.    Culture (A)  Final    STAPHYLOCOCCUS AUREUS SUSCEPTIBILITIES PERFORMED ON PREVIOUS CULTURE WITHIN THE LAST 5 DAYS. Performed at [facility], [place] [address], [place], [place] [address]    Report Status 09/09/2017 FINAL  Final  Culture, blood (routine x 2)     Status: None (Preliminary result)   Collection Time: 09/07/17  7:17 AM  Result Value Ref Range Status   Specimen Description BLOOD LEFT ANTECUBITAL  Final   Special Requests   Final    IN BOTH AEROBIC AND ANAEROBIC BOTTLES Blood Culture results may not be optimal due to an excessive volume of blood received in culture bottles   Culture   Final    NO GROWTH 4 DAYS Performed at [facility], [place] [address] [name], [place] [address]    Report Status PENDING  Incomplete  Aerobic Culture (superficial specimen)     Status: None   Collection Time: 09/08/17  3:10 PM  Result Value Ref Range Status   Specimen Description WOUND RIGHT TOE  Final   Special Requests   Final    NONE Performed at [facility], [place] [address] [place], [place] [address]    Gram Stain   Final    RARE WBC PRESENT, PREDOMINANTLY MONONUCLEAR FEW GRAM POSITIVE COCCI ABUNDANT SQUAMOUS EPITHELIAL CELLS PRESENT    Culture ABUNDANT STAPHYLOCOCCUS AUREUS  Final   Report  Status 09/10/2017 FINAL  Final   Organism ID, Bacteria STAPHYLOCOCCUS AUREUS  Final      Susceptibility   Staphylococcus aureus - MIC*    CIPROFLOXACIN <=0.5 SENSITIVE Sensitive     ERYTHROMYCIN <=0.25 SENSITIVE Sensitive     GENTAMICIN <=0.5 SENSITIVE Sensitive     OXACILLIN 0.5 SENSITIVE Sensitive     TETRACYCLINE <=1 SENSITIVE Sensitive     VANCOMYCIN <=0.5 SENSITIVE Sensitive     TRIMETH/SULFA <=10 SENSITIVE Sensitive     CLINDAMYCIN <=0.25 SENSITIVE Sensitive     RIFAMPIN <=0.5 SENSITIVE Sensitive     Inducible Clindamycin NEGATIVE Sensitive     * ABUNDANT STAPHYLOCOCCUS AUREUS  Body fluid culture     Status: None (Preliminary result)   Collection Time: 09/09/17  3:00 PM  Result Value Ref Range Status   Specimen Description SYNOVIAL LEFT  WRIST  Final   Special Requests NONE  Final   Gram Stain   Final    ABUNDANT WBC PRESENT, PREDOMINANTLY PMN MODERATE GRAM POSITIVE COCCI    Culture   Final    FEW STAPHYLOCOCCUS AUREUS SUSCEPTIBILITIES TO FOLLOW Performed at [facility], [place] [address], [place], [place] [address]    Report Status PENDING  Incomplete    Studies/Results: Mr Lumbar Spine Wo Contrast  Result Date: 09/10/2017 CLINICAL DATA:  Bacteremia. Rheumatoid arthritis on immunosuppressant. Toe and wrist septic arthritis. Back pain. EXAM: MRI LUMBAR SPINE WITHOUT CONTRAST TECHNIQUE: Multiplanar, multisequence MR imaging of the lumbar spine was performed. No intravenous contrast was administered. COMPARISON:  Abdominal CT from yesterday FINDINGS: Segmentation: 5 lumbar type vertebral bodies based on the lowest ribs. Alignment:  Trace degenerative anterolisthesis at L4-5 and L5-S1. Vertebrae: Negative for discitis or visible osteomyelitis. There is fluid signal around the right L1-2 facet joint measuring up to 23 mm. No underlying joint erosions seen yesterday but there is marrow edema today. There is no noncontrast evidence of epidural collection. Preserved foraminal fat  negative for fracture. Conus medullaris and cauda equina: Conus extends to the L1-2 level. Conus and cauda equina appear normal. Paraspinal and other soft tissues: Edematous type signal in the right more than left intrinsic back muscles, superimposed on fatty atrophy. Disc levels: T12- L1: Negative L1-L2: Acute findings at the right facet. L2-L3: Negative L3-L4: Negative L4-L5: Degenerative facet spurring with joint effusion. Trace anterolisthesis. Early endplate degeneration [id number] arthropathy with spurring and trace anterolisthesis. Mild disc bulging. Mild endplate degeneration. IMPRESSION: 1. Limited by motion and lack of contrast in this patient with acute renal failure. 2. Septic arthritis at the right L1-2 facet with 2.3 cm posterior collection. No evidence of spinal canal or foraminal abscess. 3. Degenerative changes noted above. Electronically Signed   By: Monte Fantasia M.D.   On: 09/10/2017 17:35   Mr Foot Right Wo Contrast  Result Date: 09/10/2017 CLINICAL DATA:  Great toe infection. History of rheumatoid arthritis. EXAM: MRI OF THE RIGHT FOREFOOT WITHOUT CONTRAST TECHNIQUE: Multiplanar, multisequence MR imaging of the right forefoot was performed. No intravenous contrast was administered. COMPARISON:  Right foot x-rays dated September 05, 2017. FINDINGS: Despite efforts by the technologist and patient, motion artifact is present on today's exam and could not be eliminated. This reduces exam sensitivity and specificity. Bones/Joint/Cartilage Cortical destruction and abnormal marrow edema with decreased T1 marrow signal involving the first proximal and distal phalanges surrounding the first IP joint. Prominent first IP joint effusion. Mild osteoarthritis of the first MTP joint. There are multiple small erosions involving the navicular, cuneiforms, and metatarsal bases, likely related to patient's history of rheumatoid arthritis. Ligaments The toe collateral ligaments are intact. Muscles and Tendons  There is a 4.5 x 3.4 cm multiloculated area of increased T2 signal in the intrinsic muscles of the forefoot at the plantar aspect of the second metatarsal, incompletely evaluated. This appears to correspond with muscle signal on the T1 coronal images. Soft tissues Multiple small fluid collections surrounding the first IP joint. Moderate dorsal forefoot soft tissue swelling. IMPRESSION: 1. Limited study. Septic arthritis of the first IP joint with associated osteomyelitis of the first proximal and distal phalanges. There are multiple small fluid collections surrounding the first IP joint which may represent distention of the joint space and/or small adjacent abscesses. 2. 4.5 x 3.4 cm multiloculated area of increased T2 signal in the intrinsic muscles of the forefoot at the plantar aspect of  the second metatarsal, incompletely evaluated. This appears to correspond with muscle signal on the T1 coronal images and may reflect myositis or diabetic muscle changes. Electronically Signed   By: Titus Dubin M.D.   On: 09/10/2017 17:59   Dg Chest Port 1 View  Result Date: 09/12/2017 CLINICAL DATA:  Intubation EXAM: PORTABLE CHEST 1 VIEW COMPARISON:  09/09/2017 FINDINGS: Endotracheal tube has been placed with tip measuring 3.5 cm above the carina. Enteric tube tip is off the field of view but below the left hemidiaphragm. Right central venous catheter with tip over the cavoatrial junction region. Shallow inspiration with atelectasis in the lung bases. Probable small bilateral pleural effusions. Cardiac enlargement without vascular congestion or edema. No pneumothorax. IMPRESSION: Appliances appear in satisfactory position. Cardiac enlargement. Shallow inspiration with atelectasis in the lung bases. Probable small bilateral pleural effusion. Electronically Signed   By: Lucienne Capers M.D.   On: 09/12/2017 06:56     Assessment/Plan: MSSA bacteremia ARF on CVVHD Septic arthritis L wrist L1-2 septic arthritis,  abscess R toe infection Shock liver? RA on abatacept, levunomide DM2  Total days of antibiotics: 7 (dapto)  WBC stable, LFTs better Back to OR Sunday Cephalosporin allergy Possible spine aspirate? Continue dapto, repeat Ck next week.          Bobby Rumpf MD, FACP Infectious Diseases (pager) [phone] [email or web address] 09/12/2017, 9:58 AM  LOS: 7 days

## 2017-09-12 NOTE — Progress Notes (Signed)
Initial Nutrition Assessment  DOCUMENTATION CODES:   Morbid obesity  INTERVENTION:   If unable to extubate within the next 24 hours, recommend start enteral nutrition via NGT:  Vital High Protein at 30 ml/h (720 ml per day)  Pro-stat 30 ml QID  Provides 1714 kcal total with Propofol, 123 gm protein, 602 ml free water daily  When Propofol d/c, TF goal is Vital High Protein at 60 ml/h (1440 kcal, 126 gm protein, 1204 ml free water daily)  NUTRITION DIAGNOSIS:   Inadequate oral intake related to inability to eat as evidenced by NPO status.  GOAL:   Provide needs based on ASPEN/SCCM guidelines  MONITOR:   Vent status, Skin, Labs, I & O's  REASON FOR ASSESSMENT:   Ventilator    ASSESSMENT:   57 yo female with PMH of HTN, HLD, hepatic steatosis, Rheumatoid arthritis (on immunosuppressive agents), sleep apnea, neuropathy, DM, asthma, thyroid disease, GERD, Thalassemia, and hearing loss who was admitted on 3/29 with MSSA bacteremia, AKI due to ATN.  MRI R foot showed septic arthritis with osteomyelitis. MRI lumbar spine showed septic arthritis of the right L1-2 facet joint with associated fluid collection. TEE 4/4 without evidence of large vegetation or thrombus.  S/P successful cardioversion 4/4. S/P I&D R hand, L hand, and R foot 4/4. Now with VAC to L hand. Patient remained intubated s/p procedures 4/4.   S/P CT guided aspiration R L1-L2 facet 4/5. CRRT was initiated 3/31, stopped 4/4, then resumed 4/5.   Discussed patient with RN today. Patient is currently intubated on ventilator support Temp (24hrs), Avg:98.2 F (36.8 C), Min:97.5 F (36.4 C), Max:98.4 F (36.9 C)  Propofol: 22.5 ml/hr providing 594 kcal from lipid Labs reviewed. Sodium 134 (L), phosphorus 5.1 (H) CBG's: 197-170-165 Medications reviewed and include Propofol, Colace, and Lactulose.   NUTRITION - FOCUSED PHYSICAL EXAM:    Most Recent Value  Orbital Region  No depletion  Upper Arm Region  No  depletion  Thoracic and Lumbar Region  Unable to assess  Buccal Region  No depletion  Temple Region  No depletion  Clavicle Bone Region  No depletion  Clavicle and Acromion Bone Region  No depletion  Scapular Bone Region  Unable to assess  Dorsal Hand  Unable to assess  Patellar Region  No depletion  Anterior Thigh Region  No depletion  Posterior Calf Region  No depletion  Edema (RD Assessment)  Moderate  Hair  Reviewed  Eyes  Unable to assess  Mouth  Unable to assess  Skin  Reviewed  Nails  Unable to assess       Diet Order:  Diet NPO time specified  EDUCATION NEEDS:   No education needs have been identified at this time  Skin:  Skin Assessment: Skin Integrity Issues: Skin Integrity Issues:: Incisions Incisions: L wrist (VAC), R wrist, R great toe  Last BM:  3/30  Height:   Ht Readings from Last 1 Encounters:  09/08/2017 5\' 2"  (1.575 m)    Weight:   Wt Readings from Last 1 Encounters:  09/12/17 281 lb 4.9 oz (127.6 kg)   Admission weight  260 lb (117.9 kg)--BMI 47.5  Ideal Body Weight:  50 kg  BMI:  Body mass index is 51.45 kg/m.  Estimated Nutritional Needs:   Kcal:  7425-9563  Protein:  125 gm  Fluid:  >/= 1.5 L    Molli Barrows, RD, LDN, [place] Pager [phone] After Hours Pager [phone]

## 2017-09-13 ENCOUNTER — Inpatient Hospital Stay (HOSPITAL_COMMUNITY): Payer: Medicare Other

## 2017-09-13 DIAGNOSIS — R1915 Other abnormal bowel sounds: Secondary | ICD-10-CM

## 2017-09-13 LAB — GLUCOSE, CAPILLARY
GLUCOSE-CAPILLARY: 127 mg/dL — AB (ref 65–99)
GLUCOSE-CAPILLARY: 131 mg/dL — AB (ref 65–99)
Glucose-Capillary: 128 mg/dL — ABNORMAL HIGH (ref 65–99)
Glucose-Capillary: 134 mg/dL — ABNORMAL HIGH (ref 65–99)
Glucose-Capillary: 134 mg/dL — ABNORMAL HIGH (ref 65–99)

## 2017-09-13 LAB — RENAL FUNCTION PANEL
ANION GAP: 13 (ref 5–15)
Albumin: 1.6 g/dL — ABNORMAL LOW (ref 3.5–5.0)
Albumin: 1.6 g/dL — ABNORMAL LOW (ref 3.5–5.0)
Anion gap: 11 (ref 5–15)
BUN: 31 mg/dL — ABNORMAL HIGH (ref 6–20)
BUN: 23 mg/dL — ABNORMAL HIGH (ref 6–20)
CALCIUM: 8 mg/dL — AB (ref 8.9–10.3)
CALCIUM: 7.9 mg/dL — AB (ref 8.9–10.3)
CHLORIDE: 100 mmol/L — AB (ref 101–111)
CO2: 22 mmol/L (ref 22–32)
CO2: 23 mmol/L (ref 22–32)
CREATININE: 1.89 mg/dL — AB (ref 0.44–1.00)
Chloride: 100 mmol/L — ABNORMAL LOW (ref 101–111)
Creatinine, Ser: 2.38 mg/dL — ABNORMAL HIGH (ref 0.44–1.00)
GFR calc non Af Amer: 22 mL/min — ABNORMAL LOW (ref >60)
GFR, EST AFRICAN AMERICAN: 25 mL/min — AB (ref >60)
GFR, EST AFRICAN AMERICAN: 33 mL/min — AB (ref >60)
GFR, EST NON AFRICAN AMERICAN: 29 mL/min — AB (ref >60)
Glucose, Bld: 142 mg/dL — ABNORMAL HIGH (ref 65–99)
Glucose, Bld: 157 mg/dL — ABNORMAL HIGH (ref 65–99)
POTASSIUM: 3.6 mmol/L (ref 3.5–5.1)
Phosphorus: 3.4 mg/dL (ref 2.5–4.6)
Phosphorus: 3.3 mg/dL (ref 2.5–4.6)
Potassium: 3.9 mmol/L (ref 3.5–5.1)
Sodium: 135 mmol/L (ref 135–145)
Sodium: 134 mmol/L — ABNORMAL LOW (ref 135–145)

## 2017-09-13 LAB — CK: Total CK: 441 U/L — ABNORMAL HIGH (ref 38–234)

## 2017-09-13 LAB — CBC
HCT: 21.8 % — ABNORMAL LOW (ref 36.0–46.0)
Hemoglobin: 7.3 g/dL — ABNORMAL LOW (ref 12.0–15.0)
MCH: 22.9 pg — ABNORMAL LOW (ref 26.0–34.0)
MCHC: 33.5 g/dL (ref 30.0–36.0)
MCV: 68.3 fL — AB (ref 78.0–100.0)
PLATELETS: 238 10*3/uL (ref 150–400)
RBC: 3.19 MIL/uL — ABNORMAL LOW (ref 3.87–5.11)
RDW: 25.7 % — ABNORMAL HIGH (ref 11.5–15.5)
WBC: 17.8 10*3/uL — AB (ref 4.0–10.5)

## 2017-09-13 LAB — MAGNESIUM: Magnesium: 2.5 mg/dL — ABNORMAL HIGH (ref 1.7–2.4)

## 2017-09-13 MED ORDER — FENTANYL CITRATE (PF) 100 MCG/2ML IJ SOLN
50.0000 ug | Freq: Once | INTRAMUSCULAR | Status: AC
  Administered 2017-09-13: 50 ug via INTRAVENOUS
  Filled 2017-09-13: qty 2

## 2017-09-13 MED ORDER — ORAL CARE MOUTH RINSE
15.0000 mL | OROMUCOSAL | Status: DC
  Administered 2017-09-13 – 2017-09-22 (×90): 15 mL via OROMUCOSAL

## 2017-09-13 NOTE — Progress Notes (Signed)
DAILY PROGRESS NOTE   Patient Name: Jessica Howard Date of Encounter: 09/13/2017  Chief Complaint   Intubated, sedated  Patient Profile   Jessica Howard is a 57 y.o. female with a hx of hypertension, diabetes type II on insulin, RA on immunosuppressives, thyroid disease status post radiation, sleep apnea, hyperlipidemia, GERD, beta thalassemia with chronic anemia, chronic back pain and diabetic neuropathy who is being seen today for the evaluation of atrial fibrillation with RVR at the request of Dr. Vaughan Browner.  Subjective   S/p TEE/CV. Remains in sinus rhythm  Objective   Vitals:   09/13/17 1000 09/13/17 1100 09/13/17 1125 09/13/17 1200  BP: (!) 141/53 134/65  119/67  Pulse: (!) 54 60  (!) 59  Resp: (!) 22 (!) 21  17  Temp:  98.3 F (36.8 C)    TempSrc:  Oral    SpO2: 100% 100% 100% 100%  Weight:      Height:        Intake/Output Summary (Last 24 hours) at 09/13/2017 1259 Last data filed at 09/13/2017 1200 Gross per 24 hour  Intake 1327.43 ml  Output 2302 ml  Net -974.57 ml   Filed Weights   09/12/2017 1212 09/12/17 0630 09/13/17 0500  Weight: 275 lb (124.7 kg) 281 lb 4.9 oz (127.6 kg) 281 lb 1.4 oz (127.5 kg)    Physical Exam   General appearance: Morbidly obese, intubated, sedated on vent Neck: no carotid bruit, thyroid not enlarged, symmetric, no tenderness/mass/nodules and thick neck Lungs: diminished breath sounds bilaterally Heart: regular rate and rhythm Abdomen: morbidly obese Extremities: edema 1+ edema Pulses: 2+ and symmetric Skin: Skin color, texture, turgor normal. No rashes or lesions Neurologic: Mental status: intubated, sedated on vent Psych: Cannot assesss  Inpatient Medications    Scheduled Meds: . chlorhexidine gluconate (MEDLINE KIT)  15 mL Mouth Rinse BID  . Chlorhexidine Gluconate Cloth  6 each Topical Daily  . docusate  100 mg Per Tube BID  . insulin aspart  0-15 Units Subcutaneous Q4H  . lactulose  10 g Oral Daily  . mouth rinse   15 mL Mouth Rinse 10 times per day  . pantoprazole (PROTONIX) IV  40 mg Intravenous Q24H  . sennosides  5 mL Per Tube Daily  . sodium chloride flush  3 mL Intravenous Q12H    Continuous Infusions: . sodium chloride 10 mL/hr at 09/08/2017 1900  . sodium chloride    . amiodarone 30 mg/hr (09/12/17 2338)  . DAPTOmycin (CUBICIN)  IV Stopped (09/12/17 1558)  . fentaNYL infusion INTRAVENOUS    . heparin 999 mL/hr at 09/12/17 1511  . phenylephrine (NEO-SYNEPHRINE) Adult infusion Stopped (09/12/17 1000)  . dialysis replacement fluid (prismasate) 300 mL/hr at 09/13/17 0847  . dialysis replacement fluid (prismasate) 500 mL/hr at 09/13/17 0135  . dialysate (PRISMASATE) 1,500 mL/hr at 09/13/17 1237  . propofol (DIPRIVAN) infusion 30 mcg/kg/min (09/13/17 0943)    PRN Meds: acetaminophen **OR** acetaminophen, fentaNYL, heparin, heparin, LORazepam, ondansetron **OR** ondansetron (ZOFRAN) IV   Labs   Results for orders placed or performed during the hospital encounter of 08/17/2017 (from the past 48 hour(s))  Glucose, capillary     Status: Abnormal   Collection Time: 09/28/2017  5:51 PM  Result Value Ref Range   Glucose-Capillary 201 (H) 65 - 99 mg/dL   Comment 1 Capillary Specimen   Renal function panel (daily at 1600)     Status: Abnormal   Collection Time: 10/02/2017  5:52 PM  Result Value Ref Range  Sodium 136 135 - 145 mmol/L   Potassium 4.3 3.5 - 5.1 mmol/L    Comment: DELTA CHECK NOTED   Chloride 102 101 - 111 mmol/L   CO2 22 22 - 32 mmol/L   Glucose, Bld 214 (H) 65 - 99 mg/dL   BUN 28 (H) 6 - 20 mg/dL   Creatinine, Ser 2.24 (H) 0.44 - 1.00 mg/dL   Calcium 8.1 (L) 8.9 - 10.3 mg/dL   Phosphorus 4.2 2.5 - 4.6 mg/dL   Albumin 1.8 (L) 3.5 - 5.0 g/dL   GFR calc non Af Amer 23 (L) >60 mL/min   GFR calc Af Amer 27 (L) >60 mL/min    Comment: (NOTE) The eGFR has been calculated using the CKD EPI equation. This calculation has not been validated in all clinical situations. eGFR's  persistently <60 mL/min signify possible Chronic Kidney Disease.    Anion gap 12 5 - 15    Comment: Performed at [facility] [address], [place], [place] [address]  Glucose, capillary     Status: Abnormal   Collection Time: 09/08/2017  7:40 PM  Result Value Ref Range   Glucose-Capillary 203 (H) 65 - 99 mg/dL  Triglycerides     Status: Abnormal   Collection Time: 09/15/2017  9:11 PM  Result Value Ref Range   Triglycerides 172 (H) <150 mg/dL    Comment: Performed at [facility] [address], [place], [place] [address]  Glucose, capillary     Status: Abnormal   Collection Time: 09/15/2017 11:40 PM  Result Value Ref Range   Glucose-Capillary 198 (H) 65 - 99 mg/dL   Comment 1 Capillary Specimen    Comment 2 Notify RN   Glucose, capillary     Status: Abnormal   Collection Time: 09/12/17  3:06 AM  Result Value Ref Range   Glucose-Capillary 197 (H) 65 - 99 mg/dL   Comment 1 Capillary Specimen    Comment 2 Notify RN   CBC     Status: Abnormal   Collection Time: 09/12/17  4:35 AM  Result Value Ref Range   WBC 22.5 (H) 4.0 - 10.5 K/uL   RBC 3.01 (L) 3.87 - 5.11 MIL/uL   Hemoglobin 6.5 (LL) 12.0 - 15.0 g/dL    Comment: REPEATED TO VERIFY CRITICAL RESULT CALLED TO, READ BACK BY AND VERIFIED WITH: M.HARPER,RN [id number] 09/12/17 G.MCADOO    HCT 19.6 (L) 36.0 - 46.0 %   MCV 65.1 (L) 78.0 - 100.0 fL   MCH 21.6 (L) 26.0 - 34.0 pg   MCHC 33.2 30.0 - 36.0 g/dL   RDW 23.9 (H) 11.5 - 15.5 %   Platelets 272 150 - 400 K/uL    Comment: Performed at [facility] [address] [place], [place] [address]  Heparin level (unfractionated)     Status: Abnormal   Collection Time: 09/12/17  4:35 AM  Result Value Ref Range   Heparin Unfractionated 0.14 (L) 0.30 - 0.70 IU/mL    Comment:        IF HEPARIN RESULTS ARE BELOW EXPECTED VALUES, AND PATIENT DOSAGE HAS BEEN CONFIRMED, SUGGEST FOLLOW UP TESTING OF ANTITHROMBIN III LEVELS. Performed at [facility], [place] [address] [place], [place] [address]   Protime-INR     Status: Abnormal   Collection Time: 09/12/17  4:35 AM  Result Value Ref Range   Prothrombin Time 18.2 (H) 11.4 - 15.2 seconds   INR 1.52     Comment: Performed at  [facility], [place] [address] [place], [place] [address]  Prepare RBC     Status: None   Collection Time: 09/12/17  6:02 AM  Result Value Ref Range   Order Confirmation      ORDER PROCESSED BY BLOOD BANK Performed at [facility], [place] [address] [place], [place] [address]   Hepatic function panel     Status: Abnormal   Collection Time: 09/12/17  6:43 AM  Result Value Ref Range   Total Protein 5.8 (L) 6.5 - 8.1 g/dL   Albumin 1.6 (L) 3.5 - 5.0 g/dL   AST 49 (H) 15 - 41 U/L   ALT 24 14 - 54 U/L   Alkaline Phosphatase 417 (H) 38 - 126 U/L   Total Bilirubin 3.8 (H) 0.3 - 1.2 mg/dL   Bilirubin, Direct 2.4 (H) 0.1 - 0.5 mg/dL   Indirect Bilirubin 1.4 (H) 0.3 - 0.9 mg/dL    Comment: Performed at [facility] [address], [place],  [address]  Renal function panel     Status: Abnormal   Collection Time: 09/12/17  6:43 AM  Result Value Ref Range   Sodium 134 (L) 135 - 145 mmol/L   Potassium 4.1 3.5 - 5.1 mmol/L   Chloride 102 101 - 111 mmol/L   CO2 20 (L) 22 - 32 mmol/L   Glucose, Bld 175 (H) 65 - 99 mg/dL   BUN 40 (H) 6 - 20 mg/dL   Creatinine, Ser 3.02 (H) 0.44 - 1.00 mg/dL   Calcium 7.9 (L) 8.9 - 10.3 mg/dL   Phosphorus 5.1 (H) 2.5 - 4.6 mg/dL   Albumin 1.6 (L) 3.5 - 5.0 g/dL   GFR calc non Af Amer 16 (L) >60 mL/min   GFR calc Af Amer 19 (L) >60 mL/min    Comment: (NOTE) The eGFR has been calculated using the CKD EPI equation. This calculation has not been validated in all clinical situations. eGFR's persistently <60 mL/min signify possible Chronic Kidney Disease.    Anion gap 12 5 - 15    Comment: Performed at [facility] [address] [place], [place] [address]  Glucose, capillary     Status: Abnormal   Collection Time: 09/12/17   8:04 AM  Result Value Ref Range   Glucose-Capillary 170 (H) 65 - 99 mg/dL   Comment 1 Capillary Specimen    Comment 2 Notify RN   I-STAT 3, venous blood gas (G3P V)     Status: Abnormal   Collection Time: 09/12/17 10:37 AM  Result Value Ref Range   pH, Ven 7.327 7.250 - 7.430   pCO2, Ven 43.2 (L) 44.0 - 60.0 mmHg   pO2, Ven 30.0 (LL) 32.0 - 45.0 mmHg   Bicarbonate 22.6 20.0 - 28.0 mmol/L   TCO2 24 22 - 32 mmol/L   O2 Saturation 53.0 %   Acid-base deficit 3.0 (H) 0.0 - 2.0 mmol/L   Patient temperature 98.4 F    Collection site [employer] type VENOUS    Comment NOTIFIED PHYSICIAN   Hemoglobin and hematocrit, blood     Status: Abnormal   Collection Time: 09/12/17 10:39 AM  Result Value Ref Range   Hemoglobin 7.4 (L) 12.0 - 15.0 g/dL   HCT 22.1 (L) 36.0 - 46.0 %    Comment: Performed at [facility], [address] [place], [place] [address]  Glucose, capillary     Status: Abnormal   Collection Time:  09/12/17 11:28 AM  Result Value Ref Range   Glucose-Capillary 165 (H) 65 - 99 mg/dL   Comment 1 Capillary Specimen    Comment 2 Notify RN   Aerobic/Anaerobic Culture (surgical/deep wound)     Status: None (Preliminary result)   Collection Time: 09/12/17  1:53 PM  Result Value Ref Range   Specimen Description ABSCESS    Special Requests Normal    Gram Stain      ABUNDANT WBC PRESENT, PREDOMINANTLY PMN ABUNDANT GRAM POSITIVE COCCI IN CLUSTERS Performed at [facility], [place] [address], [place], [place] [address]    Culture FEW STAPHYLOCOCCUS AUREUS    Report Status PENDING   Glucose, capillary     Status: Abnormal   Collection Time: 09/12/17  3:59 PM  Result Value Ref Range   Glucose-Capillary 138 (H) 65 - 99 mg/dL  Renal function panel (daily at 1600)     Status: Abnormal   Collection Time: 09/12/17  4:00 PM  Result Value Ref Range   Sodium 136 135 - 145 mmol/L   Potassium 3.7 3.5 - 5.1 mmol/L   Chloride 103 101 - 111 mmol/L   CO2 22 22 - 32 mmol/L    Glucose, Bld 159 (H) 65 - 99 mg/dL   BUN 42 (H) 6 - 20 mg/dL   Creatinine, Ser 3.12 (H) 0.44 - 1.00 mg/dL   Calcium 8.1 (L) 8.9 - 10.3 mg/dL   Phosphorus 4.3 2.5 - 4.6 mg/dL   Albumin 1.5 (L) 3.5 - 5.0 g/dL   GFR calc non Af Amer 16 (L) >60 mL/min   GFR calc Af Amer 18 (L) >60 mL/min    Comment: (NOTE) The eGFR has been calculated using the CKD EPI equation. This calculation has not been validated in all clinical situations. eGFR's persistently <60 mL/min signify possible Chronic Kidney Disease.    Anion gap 11 5 - 15    Comment: Performed at [facility] [address] [place], [place] [address]  Glucose, capillary     Status: Abnormal   Collection Time: 09/12/17  7:18 PM  Result Value Ref Range   Glucose-Capillary 143 (H) 65 - 99 mg/dL   Comment 1 Capillary Specimen   Glucose, capillary     Status: Abnormal   Collection Time: 09/12/17 11:43 PM  Result Value Ref Range   Glucose-Capillary 148 (H) 65 - 99 mg/dL   Comment 1 Capillary Specimen   Glucose, capillary     Status: Abnormal   Collection Time: 09/13/17  3:16 AM  Result Value Ref Range   Glucose-Capillary 127 (H) 65 - 99 mg/dL   Comment 1 Capillary Specimen   Renal function panel     Status: Abnormal   Collection Time: 09/13/17  4:38 AM  Result Value Ref Range   Sodium 135 135 - 145 mmol/L   Potassium 3.6 3.5 - 5.1 mmol/L   Chloride 100 (L) 101 - 111 mmol/L   CO2 22 22 - 32 mmol/L   Glucose, Bld 142 (H) 65 - 99 mg/dL   BUN 31 (H) 6 - 20 mg/dL   Creatinine, Ser 2.38 (H) 0.44 - 1.00 mg/dL   Calcium 8.0 (L) 8.9 - 10.3 mg/dL   Phosphorus 3.4 2.5 - 4.6 mg/dL   Albumin 1.6 (L) 3.5 - 5.0 g/dL   GFR calc non Af Amer 22 (L) >60 mL/min   GFR calc Af Amer 25 (L) >60 mL/min    Comment: (NOTE) The eGFR has been calculated using the CKD  EPI equation. This calculation has not been validated in all clinical situations. eGFR's persistently <60 mL/min signify possible Chronic Kidney Disease.    Anion gap 13 5 - 15      Comment: Performed at [facility] [address], [place], [place] [address]  CK     Status: Abnormal   Collection Time: 09/13/17  4:39 AM  Result Value Ref Range   Total CK 441 (H) 38 - 234 U/L    Comment: Performed at [facility], [place] [address], [place], [place] [address]  Magnesium     Status: Abnormal   Collection Time: 09/13/17  4:39 AM  Result Value Ref Range   Magnesium 2.5 (H) 1.7 - 2.4 mg/dL    Comment: Performed at [facility] [address], [place], [place] [address]  CBC     Status: Abnormal   Collection Time: 09/13/17  4:39 AM  Result Value Ref Range   WBC 17.8 (H) 4.0 - 10.5 K/uL   RBC 3.19 (L) 3.87 - 5.11 MIL/uL   Hemoglobin 7.3 (L) 12.0 - 15.0 g/dL   HCT 21.8 (L) 36.0 - 46.0 %   MCV 68.3 (L) 78.0 - 100.0 fL   MCH 22.9 (L) 26.0 - 34.0 pg   MCHC 33.5 30.0 - 36.0 g/dL   RDW 25.7 (H) 11.5 - 15.5 %   Platelets 238 150 - 400 K/uL    Comment: Performed at [facility], [place] [address], [place], [place] [address]  Glucose, capillary     Status: Abnormal   Collection Time: 09/13/17  7:34 AM  Result Value Ref Range   Glucose-Capillary 128 (H) 65 - 99 mg/dL   Comment 1 Capillary Specimen    Comment 2 Notify RN   Glucose, capillary     Status: Abnormal   Collection Time: 09/13/17 11:27 AM  Result Value Ref Range   Glucose-Capillary 134 (H) 65 - 99 mg/dL   Comment 1 Capillary Specimen    Comment 2 Notify RN     ECG   N/A  Telemetry   Sinus rhythm - Personally Reviewed  Radiology    Ct Aspiration  Result Date: 09/12/2017 INDICATION: 57 year old female with sepsis and evidence of a right-sided L1-L2 facet infection with surrounding fluid. She presents for aspiration of the same. EXAM: CT-guided aspiration MEDICATIONS: None ANESTHESIA/SEDATION: None COMPLICATIONS: None immediate. PROCEDURE: Informed written consent was obtained from the patient after a thorough discussion of the procedural risks, benefits and alternatives. All  questions were addressed. A timeout was performed prior to the initiation of the procedure. A planning axial CT scan was performed. The right L1-L2 facet was identified. The skin was sterilely prepped and draped in standard fashion using chlorhexidine skin prep. A small dermatotomy was made after obtaining local anesthesia with 1% lidocaine. An 18 gauge trocar needle was carefully advanced into the right L1-L2 facet. Aspiration of the facet in the surrounding soft tissues the ill to less than 1 mL of thick, purulent material. This was diluted in 1 mL of saline and sent to the lab for culture. The patient tolerated the procedure well. IMPRESSION: Successful aspiration of the right sided L1-L2 facet joint. Electronically Signed   By: Jacqulynn Cadet M.D.   On: 09/12/2017 16:48   Dg Chest Port 1 View  Result Date: 09/13/2017 CLINICAL DATA:  Check endotracheal tube EXAM: PORTABLE CHEST 1 VIEW COMPARISON:  09/12/2017 FINDINGS: Endotracheal tube, right jugular central line and nasogastric catheter are again  seen and stable. Cardiac shadow remains enlarged but stable. Increasing bilateral pleural effusions are seen with bibasilar atelectatic changes. No bony abnormality is noted. IMPRESSION: Increase in the degree of bilateral effusions and atelectatic changes in the bases. Electronically Signed   By: Inez Catalina M.D.   On: 09/13/2017 07:02   Dg Chest Port 1 View  Result Date: 09/12/2017 CLINICAL DATA:  Intubation EXAM: PORTABLE CHEST 1 VIEW COMPARISON:  09/09/2017 FINDINGS: Endotracheal tube has been placed with tip measuring 3.5 cm above the carina. Enteric tube tip is off the field of view but below the left hemidiaphragm. Right central venous catheter with tip over the cavoatrial junction region. Shallow inspiration with atelectasis in the lung bases. Probable small bilateral pleural effusions. Cardiac enlargement without vascular congestion or edema. No pneumothorax. IMPRESSION: Appliances appear in  satisfactory position. Cardiac enlargement. Shallow inspiration with atelectasis in the lung bases. Probable small bilateral pleural effusion. Electronically Signed   By: Lucienne Capers M.D.   On: 09/12/2017 06:56    Cardiac Studies   LV EF: 60% -   65%  ------------------------------------------------------------------- History:   PMH:  For Cardioversion.  Risk factors:  Hypertension. Diabetes mellitus. Dyslipidemia.  ------------------------------------------------------------------- Study Conclusions  - Left ventricle: The cavity size was normal. Wall thickness was   increased in a pattern of moderate LVH. Systolic function was   normal. The estimated ejection fraction was in the range of 60%   to 65%. Wall motion was normal; there were no regional wall   motion abnormalities. - Aortic valve: No evidence of vegetation. - Mitral valve: Mildly thickened leaflets . Trace to mild   regurgitation. Endocarditis cannot be excluded. No large   vegetation. - Left atrium: The atrium was dilated. No evidence of thrombus in   the atrial cavity or appendage. - Right atrium: The atrium was dilated. - Atrial septum: Aneurysmal interatrial septum. Negative for PFO by   color doppler and saline microbubble contrast. - Tricuspid valve: There was mild regurgitation. RVSP 50 mmHg +   RAP. - Pulmonic valve: No evidence of vegetation.  Impressions:  - LVEF 60-65%, no LAA thrombus, interatrial septal aneurysm without   PFO, mitral valve thickening - cannot exclude endocarditis, trace   to mild MR, moderate LVH, successful cardioversion with a single   150J biphasic shock to NSR.  Assessment   Principal Problem:   MSSA bacteremia Active Problems:   Thalassemia   Rheumatoid arthritis ([place])   Morbid obesity ([facility])   Acute back pain   Essential hypertension   Diabetes ([facility])   Dyslipidemia   Sepsis ([facility])   Acute kidney injury ([facility])   Immunosuppression due to drug therapy    Rheumatoid arthritis(714.0)   Septic joint of left wrist ([facility])   Osteomyelitis of great toe of right foot ([facility])   Hypertension   Hyperlipidemia   Acute hearing loss of left ear   Diabetes mellitus, insulin dependent (IDDM), controlled (HCC)   Beta thalassemia (HCC)   Symptomatic anemia   Elevated liver enzymes   Hyperbilirubinemia   Altered mental status, unspecified   Atrial flutter (HCC)   Infection of right hand   Abscess   Acute renal failure ([facility])   Plan   1. Remains in sinus rhythm. Heparin has been turned off due to bleeding from right leg. Would restart as soon as possible once bleeding controlled as recently had cardioversion. Agree with at least 6 weeks antibiotics.  Will Meredith Leeds 09/13/2017, 12:59 PM

## 2017-09-13 NOTE — Progress Notes (Signed)
Pharmacy Antibiotic Note  Jessica Howard is a 57 y.o. female admitted on 08/30/2017 with MSSA bacteremia.  Pharmacy has been consulted for Daptomycin dosing.   Pt now s/p OR with I&D of R/L hand and R foot. TEE completed in OR could not exclude endocarditis. Repeat blood cultures from 4/4 are ngtd. CRRT resumed 4/5 afternoon and to re-evaluate after the next trip to the OR. Dosing remains appropriate for CRRT.  A CK was checked this AM and resulted as 441 (~2xULN), up from 131 on 4/3. Dosing guidelines recommend the following:  Discontinue in patients with signs and symptoms of myopathy in conjunction with an increase in CPK (>5 times ULN or 1,000 units/L) or in asymptomatic patients with a CPK ?10 times ULN or >2,000 units/L  Discussed with ID - will recheck a CK in 48 hours with AM labs on 4/8. Low threshold to switch to Zyvox of the CK continues to climb.   Plan: - Continue Daptomycin 1000 mg (~8mg /kg) q48h - Will monitor CRRT/IHD plans per renal - Will plan to recheck CK with AM labs on 4/8 - Will continue to follow renal function, culture results, LOT, and antibiotic de-escalation plans   Height: 5\' 2"  (157.5 cm) Weight: 281 lb 1.4 oz (127.5 kg) IBW/kg (Calculated) : 50.1  Temp (24hrs), Avg:97.8 F (36.6 C), Min:97.2 F (36.2 C), Max:98.4 F (36.9 C)  Recent Labs  Lab 09/09/17 0432 09/09/17 1004 09/09/17 1241  09/10/17 0434  09/10/17 1806 10/06/2017 0409 10/02/2017 1752 09/12/17 0435 09/12/17 0643 09/12/17 1600 09/13/17 0438 09/13/17 0439  WBC 23.6*  --   --   --  25.3*  --   --  20.3*  --  22.5*  --   --   --  17.8*  CREATININE 2.55*  --   --    < >  --    < > 2.32*  --  2.24*  --  3.02* 3.12* 2.38*  --   LATICACIDVEN  --  1.5 1.2  --   --   --   --   --   --   --   --   --   --   --    < > = values in this interval not displayed.    Estimated Creatinine Clearance: 34.2 mL/min (A) (by C-G formula based on SCr of 2.38 mg/dL (H)).    Allergies  Allergen Reactions   . Cefuroxime Axetil Other (See Comments)    Blistering wounds per family  . Cephalosporins Other (See Comments)    Blistering wounds as an adult per family  . Bactrim [Sulfamethoxazole-Trimethoprim] Hives  . Iohexol Itching and Rash     Code: RASH, Desc: HAD ITCHING AND A RASH ABOUT ONE HOUR AFTER RETURNING HOME FROM THE CT, Onset Date: [id number]   . Lisinopril Cough  . Penicillins Hives  . Sulfa Antibiotics Hives    Antimicrobials this admission: 3/29 Vanc>>3/30 3/30 Aztreonam x 1  3/30 Dapto >>   Dose adjustments this admission: none  Microbiology results: 4/5 Abscess cx >> few staph aureus 4/4 BCx: ngtd 4/2 Wrist Aspirate: GPCs 3/31 BCx: 1o2 S. aueus 3/29 BCx 4/4: MSSA  3/29 UCx 50k MSSA  Thank you for allowing pharmacy to be a part of this patient's care.  Alycia Rossetti, PharmD, BCPS Clinical Pharmacist Pager: [phone] Clinical phone for 09/13/2017 from 7a-3:30p: [phone] If after 3:30p, please call main pharmacy at: [phone] 09/13/2017 11:46 AM

## 2017-09-13 NOTE — Progress Notes (Signed)
PULMONARY  / CRITICAL CARE MEDICINE  Name: Jessica Howard MRN: [id number] DOB: May 03, 1961    LOS: 39  REFERRING MD :  Velvet Bathe MD  CHIEF COMPLAINT:  Back Pain   BRIEF PATIENT DESCRIPTION: Patient is a 57 y.o female with RA on immunosuppressive therapy (Levunomide and Abatacept) and DM who presented to the ED with 2 weeks of progressive myalgias and arthralgias. Initial work-up was significant for AKI, HAGMA, elevated liver enzymes, lactic acidosis, leukocytosis, and acute on chronic anemia. Blood cultures subsequently grew MSSA (4/4). Initial TTE was negative for features of endocarditis; however, patient has pathologic PE findings including splinter hemorrhages and Olser nodes. Her renal function worsened over the course of her hospitalization and she is requiring CRRT. The patient also converted to atrial fibrillation and was placed on amiodarone, diltiazem, and lopressor. MRI of the right foot illustrated septic arthritis of the right first IP with associated osteomyelitis. MRI of the lumbar spine showed septic arthritis of the right L1-2 facet joint with associated fluid collection. The patient went for I&D on 4/4 and we obtained a TEE that could not exclude endocarditis. She was successfully cardioverted at that time. She has transitioned off CRRT.  LINES / TUBES: Right peripheral IV  Right IJ tunneled HD catheter  Urinary catheter [place] rain right hand  Wound VAC left hand   CULTURES: - 4/4 blood cultures on 3/29 for MSSA - Urine cultures on 3/29 growing MSSA - 1/2 Blood cultures on 3/30 with MSSA - Right toe superficial wound culture with staph aureus  - Left wrist synovial fluid with gram positive cocci  - Repeat blood cultures 4/4 pending   EVENTS:   ANTIBIOTICS: - Vancomycin 3/29 -> discontinued on 3/30  - Aztreonam 3/29 -> discontinued on 3/30  - Flagyl 3/29 -> discontinued on 3/30  - Daptomycin 3/30 -> current   INTERVAL HISTORY:  Status post aspiration of  the spinal abscess which is growing GPC's Off heparin due to oozing from right leg Remains on CVVH.  Hemodynamically stable off pressors.  VITAL SIGNS: Temp:  [97.2 F (36.2 C)-98.4 F (36.9 C)] 98.1 F (36.7 C) (04/06 0732) Pulse Rate:  [59-94] 61 (04/06 0700) Resp:  [16-29] 21 (04/06 0700) BP: (93-154)/(49-84) 144/65 (04/06 0700) SpO2:  [99 %-100 %] 100 % (04/06 0700) FiO2 (%):  [60 %] 60 % (04/06 0700) Weight:  [281 lb 1.4 oz (127.5 kg)] 281 lb 1.4 oz (127.5 kg) (04/06 0500)  INTAKE / OUTPUT: Intake/Output      04/05 0701 - 04/06 0700 04/06 0701 - 04/07 0700   I.V. (mL/kg) 1194.4 (9.4)    Blood 315    Other     NG/GT 90    IV Piggyback     Total Intake(mL/kg) 1599.4 (12.5)    Urine (mL/kg/hr) 133 (0)    Drains 75    Other 1570    Total Output 1778    Net -178.7           PHYSICAL EXAMINATION:  Gen:      No acute distress, obese HEENT:  EOMI, sclera anicteric Neck:     No masses; no thyromegaly, ET tube Lungs:    Clear to auscultation bilaterally; normal respiratory effort CV:         Regular rate and rhythm; no murmurs Abd:      + bowel sounds; soft, non-tender; no palpable masses, no distension Ext:    No edema; adequate peripheral perfusion Skin:      Warm and  dry; no rash Neuro: Sedated, unresponsive  LABS: Cbc Recent Labs  Lab 09/22/2017 0409 09/12/17 0435 09/12/17 1039 09/13/17 0439  WBC 20.3* 22.5*  --  17.8*  HGB 8.0* 6.5* 7.4* 7.3*  HCT 24.5* 19.6* 22.1* 21.8*  PLT 266 272  --  238   Chemistry Recent Labs  Lab 09/10/17 0434  09/17/2017 0409  09/12/17 0643 09/12/17 1600 09/13/17 0438 09/13/17 0439  NA  --    < >  --    < > 134* 136 135  --   K  --    < >  --    < > 4.1 3.7 3.6  --   CL  --    < >  --    < > 102 103 100*  --   CO2  --    < >  --    < > 20* 22 22  --   BUN  --    < >  --    < > 40* 42* 31*  --   CREATININE  --    < >  --    < > 3.02* 3.12* 2.38*  --   CALCIUM  --    < >  --    < > 7.9* 8.1* 8.0*  --   MG 2.7*  --  2.6*  --    --   --   --  2.5*  PHOS  --    < >  --    < > 5.1* 4.3 3.4  --   GLUCOSE  --    < >  --    < > 175* 159* 142*  --    < > = values in this interval not displayed.   Liver fxn Recent Labs  Lab 09/09/17 1004  09/26/2017 0409  09/12/17 0643 09/12/17 1600 09/13/17 0438  AST 98*  --  67*  --  49*  --   --   ALT 30  --  28  --  24  --   --   ALKPHOS 541*  --  527*  --  417*  --   --   BILITOT 5.4*  --  5.5*  --  3.8*  --   --   PROT 6.4*  --  6.1*  --  5.8*  --   --   ALBUMIN 1.5*   < > 1.4*   < > 1.6*  1.6* 1.5* 1.6*   < > = values in this interval not displayed.   coags Recent Labs  Lab 09/09/17 1004 09/12/17 0435  INR 1.69 1.52   Sepsis markers Recent Labs  Lab 09/06/17 2349 09/09/17 1004 09/09/17 1241  LATICACIDVEN  --  1.5 1.2  PROCALCITON 20.69  --   --    Cardiac markers Recent Labs  Lab 09/06/17 1030 09/10/17 0434 09/13/17 0439  CKTOTAL 233 131 441*   BNP No results for input(s): PROBNP in the last 168 hours.   ABG Recent Labs  Lab 09/09/17 1045 09/12/17 1037  HCO3 22.6 22.6  TCO2 24 24   CBG trend Recent Labs  Lab 09/12/17 1128 09/12/17 1559 09/12/17 1918 09/12/17 2343 09/13/17 0316  GLUCAP 165* 138* 143* 148* 127*   DIAGNOSES: Principal Problem:   MSSA bacteremia Active Problems:   Thalassemia   Rheumatoid arthritis ([place])   Morbid obesity ([place])   Acute back pain   Essential hypertension   Diabetes ([place])   Dyslipidemia   Sepsis ([place])  Acute kidney injury ([place])   Immunosuppression due to drug therapy   Rheumatoid arthritis(714.0)   Septic joint of left wrist ([facility])   Osteomyelitis of great toe of right foot ([facility])   Hypertension   Hyperlipidemia   Acute hearing loss of left ear   Diabetes mellitus, insulin dependent (IDDM), controlled (H[facility])   Beta thalassemia (H[facility])   Symptomatic anemia   Elevated liver enzymes   Hyperbilirubinemia   Altered mental status, unspecified   Atrial flutter ([facility])   Infection of right hand    Abscess   Acute renal failure ([place])  ASSESSMENT / PLAN: 57 y.o female with RA on immunosuppressive therapy (Levunomide and Abatacept) who presented to the ED on 3/29 with sepsis and multiorgan failure secondary to disseminated MSSA infection with bacteremia  INFECTIOUS A: Sepsis secondary to MSSA Bacteremia/likely endocarditis  MSSA septic arthritis, spinal abscess Pathological finding consistent with endocarditis. MRI of the right foot showing findings consistent with septic arthritis of the first IP with associated osteomyelitis.  OR on 4/4 for I&D of the right hand, right foot, and left thumb. TEE cannot exclude endocarditis with thickening of the mitral valve.  Drainage of lumbar abscess 4/5  P:   Continue daptomycin per infectious disease Return to the OR on 4/6 per orthopedic surgery Cultures  CARDIOVASCULAR A:  Hypotension Patient is hypertensive at baseline on bystolic, irbesartan, and spironolactone. Off pressors  Atrial Fib 2/2 sepsis  TEE without evidence of large vegetation or thrombus, cardioverted on 4/4.  On amiodarone and metoprolol   P:  Cardiology onboard.  Continue amiodarone  RENAL A: AKI secondary to MSSA bacteremia, blood clots in catheter  Creatine elevated from baseline of 0.9 to 3.36 on admission  UA with hematuria, proteinuria, and bacteruria. Cultures growing Staph aureus.  P:   Continue CRRT  PULMONARY A: Post surgical ventilation  Obstructive Sleep Apnea  Vent Mode: PRVC FiO2 (%):  [60 %] 60 % Set Rate:  [18 bmp] 18 bmp Vt Set:  [470 mL] 470 mL PEEP:  [5 cmH20] 5 cmH20 Plateau Pressure:  [9 cmH20-23 cmH20] 23 cmH20  P:   Continue current sedation PSV trial as tolerated.  We will keep intubated as the plan is to return to the OR tomorrow. Following venous gas as she is a hard stick for ABG  GASTROINTESTINAL A: Elevated LFTs ? Cholestatic liver injury  Presented with gross elevation in alk phos and direct bilirubin with minor  elevation in LFTs and indirect bilirubin.  Abdominal ultrasound unremarkable for acute etiology with no pericholecystic fluid, wall thickening, gallstones, or ductal dilation.  CT abdomen and pelvis yesterday with normal liver, intra-/extrahepatic biliary duct, and gallbladder.  Ammonium elevated to 44, INR 1.65  P:   Continue to monitor.  HEMATOLOGIC A:  Beta Thalassemia  Acute on chronic anemia secondary to sepsis Hgb dropped from baseline of ~10 to 7.6 on admission.  LDH elevated but haptoglobin elevated and reticulocyte WNL therefore, unlikely to be 2/2 hemolytic anemia.   P:  Follow CBC.  Transfuse for goal hemoglobin of 7  ENDOCRINE A: Insulin Dependent Type 2 DM On Humolin 75 units TID at home   P:   SSI coverage  NEUROLOGICAL A: Altered Mental Status  Multiple contributing factors including A-fib, sepsis, opiates CT head and ammonium WNL  P:  Wean sedation  The patient is critically ill with multiple organ system failure and requires high complexity decision making for assessment and support, frequent evaluation and titration of therapies, advanced monitoring, review of  radiographic studies and interpretation of complex data.   Critical Care Time devoted to patient care services, exclusive of separately billable procedures, described in this note is 35 minutes.   Marshell Garfinkel MD [facility] Pager [phone] If no answer or after 3pm call: [phone] 09/13/2017, 8:10 AM

## 2017-09-13 NOTE — Progress Notes (Signed)
INFECTIOUS DISEASE PROGRESS NOTE  ID: Jessica Howard is a 57 y.o. female with  Principal Problem:   MSSA bacteremia Active Problems:   Thalassemia   Rheumatoid arthritis ([place])   Morbid obesity ([place])   Acute back pain   Essential hypertension   Diabetes ([place])   Dyslipidemia   Sepsis ([place])   Acute kidney injury ([place])   Immunosuppression due to drug therapy   Rheumatoid arthritis(714.0)   Septic joint of left wrist ([place])   Osteomyelitis of great toe of right foot ([place])   Hypertension   Hyperlipidemia   Acute hearing loss of left ear   Diabetes mellitus, insulin dependent (IDDM), controlled (HCC)   Beta thalassemia (HCC)   Symptomatic anemia   Elevated liver enzymes   Hyperbilirubinemia   Altered mental status, unspecified   Atrial flutter (HCC)   Infection of right hand   Abscess   Acute renal failure (HCC)  Subjective: continues on vent, CVVHD Off pressors  Abtx:  Anti-infectives (From admission, onward)   Start     Dose/Rate Route Frequency Ordered Stop   09/08/17 1600  DAPTOmycin (CUBICIN) 1,000 mg in sodium chloride 0.9 % IVPB     1,000 mg 240 mL/hr over 30 Minutes Intravenous Every 48 hours 09/07/17 1019     09/07/17 1000  vancomycin (VANCOCIN) 1,500 mg in sodium chloride 0.9 % 500 mL IVPB  Status:  Discontinued     1,500 mg 250 mL/hr over 120 Minutes Intravenous Every 48 hours 08/27/2017 0906 09/06/17 0935   09/06/17 1600  DAPTOmycin (CUBICIN) 984 mg in sodium chloride 0.9 % IVPB  Status:  Discontinued     8 mg/kg  123 kg 239.4 mL/hr over 30 Minutes Intravenous Every 48 hours 09/06/17 1436 09/07/17 1019   09/06/17 1000  aztreonam (AZACTAM) 0.5 g in dextrose 5 % 50 mL IVPB  Status:  Discontinued     0.5 g 100 mL/hr over 30 Minutes Intravenous Every 8 hours 09/06/17 0943 09/06/17 1223   08/14/2017 2000  metroNIDAZOLE (FLAGYL) IVPB 500 mg  Status:  Discontinued     500 mg 100 mL/hr over 60 Minutes Intravenous Every 8 hours 08/12/2017 1343 09/06/17 0028   08/29/2017 0900  aztreonam (AZACTAM) 2 g in sodium chloride 0.9 % 100 mL IVPB  Status:  Discontinued     2 g 200 mL/hr over 30 Minutes Intravenous Every 8 hours 08/10/2017 0853 09/06/17 0028   09/04/2017 0900  metroNIDAZOLE (FLAGYL) IVPB 500 mg     500 mg 100 mL/hr over 60 Minutes Intravenous  Once 09/02/2017 0853 08/24/2017 1035   09/04/2017 0830  piperacillin-tazobactam (ZOSYN) IVPB 3.375 g  Status:  Discontinued     3.375 g 100 mL/hr over 30 Minutes Intravenous  Once 08/11/2017 0816 08/30/2017 0836   08/11/2017 0830  vancomycin (VANCOCIN) IVPB 1000 mg/200 mL premix  Status:  Discontinued     1,000 mg 200 mL/hr over 60 Minutes Intravenous  Once 08/31/2017 0816 08/14/2017 0822   09/07/2017 0830  vancomycin (VANCOCIN) 2,000 mg in sodium chloride 0.9 % 500 mL IVPB     2,000 mg 250 mL/hr over 120 Minutes Intravenous  Once 08/19/2017 0822 08/21/2017 1342      Medications:  Scheduled: . chlorhexidine gluconate (MEDLINE KIT)  15 mL Mouth Rinse BID  . Chlorhexidine Gluconate Cloth  6 each Topical Daily  . docusate  100 mg Per Tube BID  . insulin aspart  0-15 Units Subcutaneous Q4H  . lactulose  10 g Oral Daily  . mouth  rinse  15 mL Mouth Rinse 10 times per day  . pantoprazole (PROTONIX) IV  40 mg Intravenous Q24H  . sennosides  5 mL Per Tube Daily  . sodium chloride flush  3 mL Intravenous Q12H    Objective: Vital signs in last 24 hours: Temp:  [97.2 F (36.2 C)-98.4 F (36.9 C)] 98.1 F (36.7 C) (04/06 0732) Pulse Rate:  [54-94] 54 (04/06 1000) Resp:  [16-29] 22 (04/06 1000) BP: (97-154)/(51-84) 141/53 (04/06 1000) SpO2:  [99 %-100 %] 100 % (04/06 1000) FiO2 (%):  [50 %-60 %] 50 % (04/06 1000) Weight:  [127.5 kg (281 lb 1.4 oz)] 127.5 kg (281 lb 1.4 oz) (04/06 0500)   General appearance: no distress Resp: diminished breath sounds anterior - bilateral Cardio: regular rate and rhythm GI: normal findings: soft, non-tender and abnormal findings:  hypoactive bowel sounds  Lab Results Recent Labs     09/12/17 0435  09/12/17 1039 09/12/17 1600 09/13/17 0438 09/13/17 0439  WBC 22.5*  --   --   --   --  17.8*  HGB 6.5*  --  7.4*  --   --  7.3*  HCT 19.6*  --  22.1*  --   --  21.8*  NA  --    < >  --  136 135  --   K  --    < >  --  3.7 3.6  --   CL  --    < >  --  103 100*  --   CO2  --    < >  --  22 22  --   BUN  --    < >  --  42* 31*  --   CREATININE  --    < >  --  3.12* 2.38*  --    < > = values in this interval not displayed.   Liver Panel Recent Labs    09/09/2017 0409  09/12/17 0643 09/12/17 1600 09/13/17 0438  PROT 6.1*  --  5.8*  --   --   ALBUMIN 1.4*   < > 1.6*  1.6* 1.5* 1.6*  AST 67*  --  49*  --   --   ALT 28  --  24  --   --   ALKPHOS 527*  --  417*  --   --   BILITOT 5.5*  --  3.8*  --   --   BILIDIR 3.5*  --  2.4*  --   --   IBILI 2.0*  --  1.4*  --   --    < > = values in this interval not displayed.   Sedimentation Rate No results for input(s): ESRSEDRATE in the last 72 hours. C-Reactive Protein No results for input(s): CRP in the last 72 hours.  Microbiology: Recent Results (from the past 240 hour(s))  Blood Culture (routine x 2)     Status: Abnormal   Collection Time: 09/06/2017  9:50 AM  Result Value Ref Range Status   Specimen Description BLOOD RIGHT HAND  Final   Special Requests   Final    BOTTLES DRAWN AEROBIC AND ANAEROBIC Blood Culture adequate volume   Culture  Setup Time   Final    GRAM POSITIVE COCCI IN BOTH AEROBIC AND ANAEROBIC BOTTLES CRITICAL RESULT CALLED TO, READ BACK BY AND VERIFIED WITH: G ABBOTT 09/06/17 0006 JDW    Culture (A)  Final    STAPHYLOCOCCUS AUREUS SUSCEPTIBILITIES PERFORMED ON PREVIOUS CULTURE WITHIN THE  LAST 5 DAYS. Performed at [facility], [place] [address] [place], [place] [address]    Report Status 09/08/2017 FINAL  Final  Blood Culture (routine x 2)     Status: Abnormal   Collection Time: 08/24/2017  1:54 PM  Result Value Ref Range Status   Specimen Description BLOOD RIGHT FOREARM  Final    Special Requests   Final    BOTTLES DRAWN AEROBIC AND ANAEROBIC Blood Culture adequate volume   Culture  Setup Time   Final    GRAM POSITIVE COCCI IN BOTH AEROBIC AND ANAEROBIC BOTTLES CRITICAL RESULT CALLED TO, READ BACK BY AND VERIFIED WITH: Denton Brick PHARMD 09/06/17 0006 JDW Performed at [facility], [place] [address] [place], [place] [address]    Culture STAPHYLOCOCCUS AUREUS (A)  Final   Report Status 09/08/2017 FINAL  Final   Organism ID, Bacteria STAPHYLOCOCCUS AUREUS  Final      Susceptibility   Staphylococcus aureus - MIC*    CIPROFLOXACIN <=0.5 SENSITIVE Sensitive     ERYTHROMYCIN <=0.25 SENSITIVE Sensitive     GENTAMICIN <=0.5 SENSITIVE Sensitive     OXACILLIN 0.5 SENSITIVE Sensitive     TETRACYCLINE <=1 SENSITIVE Sensitive     VANCOMYCIN 1 SENSITIVE Sensitive     TRIMETH/SULFA <=10 SENSITIVE Sensitive     CLINDAMYCIN <=0.25 SENSITIVE Sensitive     RIFAMPIN <=0.5 SENSITIVE Sensitive     Inducible Clindamycin NEGATIVE Sensitive     * STAPHYLOCOCCUS AUREUS  Blood Culture ID Panel (Reflexed)     Status: Abnormal   Collection Time: 08/14/2017  1:54 PM  Result Value Ref Range Status   Enterococcus species NOT DETECTED NOT DETECTED Final   Listeria monocytogenes NOT DETECTED NOT DETECTED Final   Staphylococcus species DETECTED (A) NOT DETECTED Final    Comment: CRITICAL RESULT CALLED TO, READ BACK BY AND VERIFIED WITH: G ABBOTT PHARMD 09/06/17 0006 JDW    Staphylococcus aureus DETECTED (A) NOT DETECTED Final    Comment: Methicillin (oxacillin) susceptible Staphylococcus aureus (MSSA). Preferred therapy is anti staphylococcal beta lactam antibiotic (Cefazolin or Nafcillin), unless clinically contraindicated. CRITICAL RESULT CALLED TO, READ BACK BY AND VERIFIED WITH: G ABBOTT PHARMD 09/06/17 0006 JDW    Methicillin resistance NOT DETECTED NOT DETECTED Final   Streptococcus species NOT DETECTED NOT DETECTED Final   Streptococcus agalactiae NOT DETECTED NOT DETECTED Final    Streptococcus pneumoniae NOT DETECTED NOT DETECTED Final   Streptococcus pyogenes NOT DETECTED NOT DETECTED Final   Acinetobacter baumannii NOT DETECTED NOT DETECTED Final   Enterobacteriaceae species NOT DETECTED NOT DETECTED Final   Enterobacter cloacae complex NOT DETECTED NOT DETECTED Final   Escherichia coli NOT DETECTED NOT DETECTED Final   Klebsiella oxytoca NOT DETECTED NOT DETECTED Final   Klebsiella pneumoniae NOT DETECTED NOT DETECTED Final   Proteus species NOT DETECTED NOT DETECTED Final   Serratia marcescens NOT DETECTED NOT DETECTED Final   Haemophilus influenzae NOT DETECTED NOT DETECTED Final   Neisseria meningitidis NOT DETECTED NOT DETECTED Final   Pseudomonas aeruginosa NOT DETECTED NOT DETECTED Final   Candida albicans NOT DETECTED NOT DETECTED Final   Candida glabrata NOT DETECTED NOT DETECTED Final   Candida krusei NOT DETECTED NOT DETECTED Final   Candida parapsilosis NOT DETECTED NOT DETECTED Final   Candida tropicalis NOT DETECTED NOT DETECTED Final    Comment: Performed at [facility], [place] [address], [place], [place] [address]  Urine culture     Status: Abnormal   Collection Time: 09/02/2017  3:56 PM  Result Value Ref Range Status   Specimen Description URINE, RANDOM  Final   Special Requests   Final    NONE Performed at [facility], [address], [place], [place] [address]    Culture 50,000 COLONIES/mL STAPHYLOCOCCUS AUREUS (A)  Final   Report Status 09/07/2017 FINAL  Final   Organism ID, Bacteria STAPHYLOCOCCUS AUREUS (A)  Final      Susceptibility   Staphylococcus aureus - MIC*    CIPROFLOXACIN <=0.5 SENSITIVE Sensitive     GENTAMICIN <=0.5 SENSITIVE Sensitive     NITROFURANTOIN 32 SENSITIVE Sensitive     OXACILLIN 0.5 SENSITIVE Sensitive     TETRACYCLINE <=1 SENSITIVE Sensitive     VANCOMYCIN 1 SENSITIVE Sensitive     TRIMETH/SULFA <=10 SENSITIVE Sensitive     CLINDAMYCIN <=0.25 SENSITIVE Sensitive     RIFAMPIN <=0.5 SENSITIVE  Sensitive     Inducible Clindamycin NEGATIVE Sensitive     * 50,000 COLONIES/mL STAPHYLOCOCCUS AUREUS  MRSA PCR Screening     Status: None   Collection Time: 09/06/17  5:20 AM  Result Value Ref Range Status   MRSA by PCR NEGATIVE NEGATIVE Final    Comment:        The GeneXpert MRSA Assay (FDA approved for NASAL specimens only), is one component of a comprehensive MRSA colonization surveillance program. It is not intended to diagnose MRSA infection nor to guide or monitor treatment for MRSA infections. Performed at [facility], [place] [address], [place], [place] [address]   Culture, blood (routine x 2)     Status: Abnormal   Collection Time: 09/07/17  7:09 AM  Result Value Ref Range Status   Specimen Description BLOOD RIGHT HAND  Final   Special Requests AEROBIC BOTTLE ONLY Blood Culture adequate volume  Final   Culture  Setup Time   Final    GRAM POSITIVE COCCI AEROBIC BOTTLE ONLY CRITICAL VALUE NOTED.  VALUE IS CONSISTENT WITH PREVIOUSLY REPORTED AND CALLED VALUE.    Culture (A)  Final    STAPHYLOCOCCUS AUREUS SUSCEPTIBILITIES PERFORMED ON PREVIOUS CULTURE WITHIN THE LAST 5 DAYS. Performed at [facility], [place] [address] [place], [place] [address]    Report Status 09/09/2017 FINAL  Final  Culture, blood (routine x 2)     Status: None   Collection Time: 09/07/17  7:17 AM  Result Value Ref Range Status   Specimen Description BLOOD LEFT ANTECUBITAL  Final   Special Requests   Final    IN BOTH AEROBIC AND ANAEROBIC BOTTLES Blood Culture results may not be optimal due to an excessive volume of blood received in culture bottles   Culture   Final    NO GROWTH 5 DAYS Performed at [facility], [place] [address] [place], [place] [address]    Report Status 09/12/2017 FINAL  Final  Aerobic Culture (superficial specimen)     Status: None   Collection Time: 09/08/17  3:10 PM  Result Value Ref Range Status   Specimen Description WOUND RIGHT TOE  Final   Special  Requests   Final    NONE Performed at [facility], [place] [address] [place], [place] [address]    Gram Stain   Final    RARE WBC PRESENT, PREDOMINANTLY MONONUCLEAR FEW GRAM POSITIVE COCCI ABUNDANT SQUAMOUS EPITHELIAL CELLS PRESENT    Culture ABUNDANT STAPHYLOCOCCUS AUREUS  Final   Report Status 09/10/2017 FINAL  Final   Organism ID, Bacteria STAPHYLOCOCCUS AUREUS  Final      Susceptibility   Staphylococcus aureus - MIC*    CIPROFLOXACIN <=0.5 SENSITIVE Sensitive     ERYTHROMYCIN <=0.25 SENSITIVE Sensitive     GENTAMICIN <=0.5 SENSITIVE Sensitive     OXACILLIN 0.5 SENSITIVE Sensitive     TETRACYCLINE <=1 SENSITIVE Sensitive     VANCOMYCIN <=0.5 SENSITIVE Sensitive     TRIMETH/SULFA <=10 SENSITIVE Sensitive     CLINDAMYCIN <=0.25 SENSITIVE Sensitive     RIFAMPIN <=0.5 SENSITIVE Sensitive     Inducible Clindamycin NEGATIVE Sensitive     * ABUNDANT STAPHYLOCOCCUS AUREUS  Body fluid culture     Status: None   Collection Time: 09/09/17  3:00 PM  Result Value Ref Range Status   Specimen Description SYNOVIAL LEFT WRIST  Final   Special Requests NONE  Final   Gram Stain   Final    ABUNDANT WBC PRESENT, PREDOMINANTLY PMN MODERATE GRAM POSITIVE COCCI Performed at [facility], [place] [address], [place], [place] [address]    Culture FEW STAPHYLOCOCCUS AUREUS  Final   Report Status 09/12/2017 FINAL  Final   Organism ID, Bacteria STAPHYLOCOCCUS AUREUS  Final      Susceptibility   Staphylococcus aureus - MIC*    CIPROFLOXACIN <=0.5 SENSITIVE Sensitive     ERYTHROMYCIN <=0.25 SENSITIVE Sensitive     GENTAMICIN <=0.5 SENSITIVE Sensitive     OXACILLIN 0.5 SENSITIVE Sensitive     TETRACYCLINE <=1 SENSITIVE Sensitive     VANCOMYCIN <=0.5 SENSITIVE Sensitive     TRIMETH/SULFA <=10 SENSITIVE Sensitive     CLINDAMYCIN <=0.25 SENSITIVE Sensitive     RIFAMPIN <=0.5 SENSITIVE Sensitive     Inducible Clindamycin NEGATIVE Sensitive     * FEW STAPHYLOCOCCUS AUREUS  Culture, blood  (Routine X 2) w Reflex to ID Panel     Status: None (Preliminary result)   Collection Time: 09/10/2017  9:25 AM  Result Value Ref Range Status   Specimen Description BLOOD LEFT FOREARM  Final   Special Requests   Final    BOTTLES DRAWN AEROBIC AND ANAEROBIC Blood Culture adequate volume   Culture   Final    NO GROWTH 1 DAY Performed at [facility], [address], [place], [place] [address]    Report Status PENDING  Incomplete  Culture, blood (single)     Status: None (Preliminary result)   Collection Time: 10/06/2017  9:32 AM  Result Value Ref Range Status   Specimen Description BLOOD RIGHT HAND  Final   Special Requests   Final    BOTTLES DRAWN AEROBIC ONLY Blood Culture adequate volume   Culture   Final    NO GROWTH 1 DAY Performed at [facility], [place] [address] [place], [place] [address]    Report Status PENDING  Incomplete  Aerobic/Anaerobic Culture (surgical/deep wound)     Status: None (Preliminary result)   Collection Time: 09/12/17  1:53 PM  Result Value Ref Range Status   Specimen Description ABSCESS  Final   Special Requests Normal  Final   Gram Stain   Final    ABUNDANT WBC PRESENT, PREDOMINANTLY PMN ABUNDANT GRAM POSITIVE COCCI IN CLUSTERS Performed at [facility], [place] [address] [place], [place] [address]    Culture PENDING  Incomplete   Report Status PENDING  Incomplete    Studies/Results: Ct Aspiration  Result Date: 09/12/2017 INDICATION: 57 year old female with sepsis and evidence of a right-sided L1-L2 facet infection with surrounding fluid. She presents for aspiration of  the same. EXAM: CT-guided aspiration MEDICATIONS: None ANESTHESIA/SEDATION: None COMPLICATIONS: None immediate. PROCEDURE: Informed written consent was obtained from the patient after a thorough discussion of the procedural risks, benefits and alternatives. All questions were addressed. A timeout was performed prior to the initiation of the procedure. A planning axial CT  scan was performed. The right L1-L2 facet was identified. The skin was sterilely prepped and draped in standard fashion using chlorhexidine skin prep. A small dermatotomy was made after obtaining local anesthesia with 1% lidocaine. An 18 gauge trocar needle was carefully advanced into the right L1-L2 facet. Aspiration of the facet in the surrounding soft tissues the ill to less than 1 mL of thick, purulent material. This was diluted in 1 mL of saline and sent to the lab for culture. The patient tolerated the procedure well. IMPRESSION: Successful aspiration of the right sided L1-L2 facet joint. Electronically Signed   By: Jacqulynn Cadet M.D.   On: 09/12/2017 16:48   Dg Chest Port 1 View  Result Date: 09/13/2017 CLINICAL DATA:  Check endotracheal tube EXAM: PORTABLE CHEST 1 VIEW COMPARISON:  09/12/2017 FINDINGS: Endotracheal tube, right jugular central line and nasogastric catheter are again seen and stable. Cardiac shadow remains enlarged but stable. Increasing bilateral pleural effusions are seen with bibasilar atelectatic changes. No bony abnormality is noted. IMPRESSION: Increase in the degree of bilateral effusions and atelectatic changes in the bases. Electronically Signed   By: Inez Catalina M.D.   On: 09/13/2017 07:02   Dg Chest Port 1 View  Result Date: 09/12/2017 CLINICAL DATA:  Intubation EXAM: PORTABLE CHEST 1 VIEW COMPARISON:  09/09/2017 FINDINGS: Endotracheal tube has been placed with tip measuring 3.5 cm above the carina. Enteric tube tip is off the field of view but below the left hemidiaphragm. Right central venous catheter with tip over the cavoatrial junction region. Shallow inspiration with atelectasis in the lung bases. Probable small bilateral pleural effusions. Cardiac enlargement without vascular congestion or edema. No pneumothorax. IMPRESSION: Appliances appear in satisfactory position. Cardiac enlargement. Shallow inspiration with atelectasis in the lung bases. Probable small  bilateral pleural effusion. Electronically Signed   By: Lucienne Capers M.D.   On: 09/12/2017 06:56     Assessment/Plan: MSSA bacteremia ARF on CVVHD Septic arthritis L wrist L1-2 septic arthritis, abscess R toe infection Shock liver? RA on abatacept, levunomide DM2  Total days of antibiotics:8 (dapto)  CK up  Will stop dapto if continues Repeat CK 48h Back to OR tomorrow WBC continues to improve FSG well controlled My great appreciation to our outstanding pharmacy Available as needed on 4-7         Bobby Rumpf MD, FACP Infectious Diseases (pager) [phone] [email or web address] 09/13/2017, 10:40 AM  LOS: 8 days

## 2017-09-13 NOTE — Progress Notes (Signed)
Orthopedic Trauma Service Progress Note Weekend Coverage   Patient ID: Jessica Howard MRN: [id number] DOB/AGE: 55-Sep-1962 55 y.o.  Subjective:  Vent and sedated  RN contacted me regarding VAC malfunction and alarms  OR tomorrow with Dr. Mardelle Matte  Pt not on tube feeds   Dialysis at bedside due to ARF from sepsis and hypotension  Review of Systems  Unable to perform ROS: Intubated    Objective:   VITALS:   Vitals:   09/13/17 0900 09/13/17 1000 09/13/17 1100 09/13/17 1125  BP: (!) 142/63 (!) 141/53 134/65   Pulse: (!) 59 (!) 54 60   Resp: 18 (!) 22 (!) 21   Temp:   98.3 F (36.8 C)   TempSrc:   Oral   SpO2: 100% 100% 100% 100%  Weight:      Height:        Estimated body mass index is 51.41 kg/m as calculated from the following:   Height as of this encounter: 5\' 2"  (1.575 m).   Weight as of this encounter: 127.5 kg (281 lb 1.4 oz).   Intake/Output      04/05 0701 - 04/06 0700 04/06 0701 - 04/07 0700   I.V. (mL/kg) 1194.4 (9.4) 137 (1.1)   Blood 315    Other     NG/GT 90 65   IV Piggyback     Total Intake(mL/kg) 1599.4 (12.5) 202 (1.6)   Urine (mL/kg/hr) 133 (0) 25 (0)   Drains 75    Other 1570 429   Total Output 1778 454   Net -178.7 -252          LABS  Results for orders placed or performed during the hospital encounter of 09/01/2017 (from the past 24 hour(s))  Aerobic/Anaerobic Culture (surgical/deep wound)     Status: None (Preliminary result)   Collection Time: 09/12/17  1:53 PM  Result Value Ref Range   Specimen Description ABSCESS    Special Requests Normal    Gram Stain      ABUNDANT WBC PRESENT, PREDOMINANTLY PMN ABUNDANT GRAM POSITIVE COCCI IN CLUSTERS Performed at [facility], [address], [place], [place] [address]    Culture FEW STAPHYLOCOCCUS AUREUS    Report Status PENDING   Glucose, capillary     Status: Abnormal   Collection Time: 09/12/17  3:59 PM  Result Value Ref Range   Glucose-Capillary 138  (H) 65 - 99 mg/dL  Renal function panel (daily at 1600)     Status: Abnormal   Collection Time: 09/12/17  4:00 PM  Result Value Ref Range   Sodium 136 135 - 145 mmol/L   Potassium 3.7 3.5 - 5.1 mmol/L   Chloride 103 101 - 111 mmol/L   CO2 22 22 - 32 mmol/L   Glucose, Bld 159 (H) 65 - 99 mg/dL   BUN 42 (H) 6 - 20 mg/dL   Creatinine, Ser 3.12 (H) 0.44 - 1.00 mg/dL   Calcium 8.1 (L) 8.9 - 10.3 mg/dL   Phosphorus 4.3 2.5 - 4.6 mg/dL   Albumin 1.5 (L) 3.5 - 5.0 g/dL   GFR calc non Af Amer 16 (L) >60 mL/min   GFR calc Af Amer 18 (L) >60 mL/min   Anion gap 11 5 - 15  Glucose, capillary     Status: Abnormal   Collection Time: 09/12/17  7:18 PM  Result Value Ref Range   Glucose-Capillary 143 (H) 65 - 99 mg/dL   Comment 1 Capillary Specimen   Glucose, capillary  Status: Abnormal   Collection Time: 09/12/17 11:43 PM  Result Value Ref Range   Glucose-Capillary 148 (H) 65 - 99 mg/dL   Comment 1 Capillary Specimen   Glucose, capillary     Status: Abnormal   Collection Time: 09/13/17  3:16 AM  Result Value Ref Range   Glucose-Capillary 127 (H) 65 - 99 mg/dL   Comment 1 Capillary Specimen   Renal function panel     Status: Abnormal   Collection Time: 09/13/17  4:38 AM  Result Value Ref Range   Sodium 135 135 - 145 mmol/L   Potassium 3.6 3.5 - 5.1 mmol/L   Chloride 100 (L) 101 - 111 mmol/L   CO2 22 22 - 32 mmol/L   Glucose, Bld 142 (H) 65 - 99 mg/dL   BUN 31 (H) 6 - 20 mg/dL   Creatinine, Ser 2.38 (H) 0.44 - 1.00 mg/dL   Calcium 8.0 (L) 8.9 - 10.3 mg/dL   Phosphorus 3.4 2.5 - 4.6 mg/dL   Albumin 1.6 (L) 3.5 - 5.0 g/dL   GFR calc non Af Amer 22 (L) >60 mL/min   GFR calc Af Amer 25 (L) >60 mL/min   Anion gap 13 5 - 15  CK     Status: Abnormal   Collection Time: 09/13/17  4:39 AM  Result Value Ref Range   Total CK 441 (H) 38 - 234 U/L  Magnesium     Status: Abnormal   Collection Time: 09/13/17  4:39 AM  Result Value Ref Range   Magnesium 2.5 (H) 1.7 - 2.4 mg/dL  CBC     Status:  Abnormal   Collection Time: 09/13/17  4:39 AM  Result Value Ref Range   WBC 17.8 (H) 4.0 - 10.5 K/uL   RBC 3.19 (L) 3.87 - 5.11 MIL/uL   Hemoglobin 7.3 (L) 12.0 - 15.0 g/dL   HCT 21.8 (L) 36.0 - 46.0 %   MCV 68.3 (L) 78.0 - 100.0 fL   MCH 22.9 (L) 26.0 - 34.0 pg   MCHC 33.5 30.0 - 36.0 g/dL   RDW 25.7 (H) 11.5 - 15.5 %   Platelets 238 150 - 400 K/uL  Glucose, capillary     Status: Abnormal   Collection Time: 09/13/17  7:34 AM  Result Value Ref Range   Glucose-Capillary 128 (H) 65 - 99 mg/dL   Comment 1 Capillary Specimen    Comment 2 Notify RN   Glucose, capillary     Status: Abnormal   Collection Time: 09/13/17 11:27 AM  Result Value Ref Range   Glucose-Capillary 134 (H) 65 - 99 mg/dL   Comment 1 Capillary Specimen    Comment 2 Notify RN      PHYSICAL EXAM:    Gen: Intubated, arouses with manipulation of dressing to L hand.  Ext:       Left Hand  VAC adhesive pull off skin   Sponges removed  Purulent material remains in dorsal soft tissue of hand, Ulnar > Radial    Skin is a little macerated   Some skin desquamation also noted  Mod hand edema  Unable to assess motor or sensory functions  Brisk cap refill  Splinter hemorrhages    New veraflo applied   Thin gray foam cut to size   Did place meptiel/foam bridge between 2 incisions   Good seal obtained   Instilled with 8 cc of NS   All other settings maintained    Dressings to R Hand and foot stable  Assessment/Plan: 2  Days Post-Op   Principal Problem:   MSSA bacteremia Active Problems:   Thalassemia   Rheumatoid arthritis ([place])   Morbid obesity ([place])   Acute back pain   Essential hypertension   Diabetes ([place])   Dyslipidemia   Sepsis ([place])   Acute kidney injury ([place])   Immunosuppression due to drug therapy   Rheumatoid arthritis(714.0)   Septic joint of left wrist ([place])   Osteomyelitis of great toe of right foot ([place])   Hypertension   Hyperlipidemia   Acute hearing loss of left ear   Diabetes  mellitus, insulin dependent (IDDM), controlled (HCC)   Beta thalassemia (HCC)   Symptomatic anemia   Elevated liver enzymes   Hyperbilirubinemia   Altered mental status, unspecified   Atrial flutter ([place])   Infection of right hand   Abscess   Acute renal failure ([place])   Anti-infectives (From admission, onward)   Start     Dose/Rate Route Frequency Ordered Stop   09/08/17 1600  DAPTOmycin (CUBICIN) 1,000 mg in sodium chloride 0.9 % IVPB     1,000 mg 240 mL/hr over 30 Minutes Intravenous Every 48 hours 09/07/17 1019     09/07/17 1000  vancomycin (VANCOCIN) 1,500 mg in sodium chloride 0.9 % 500 mL IVPB  Status:  Discontinued     1,500 mg 250 mL/hr over 120 Minutes Intravenous Every 48 hours 08/11/2017 0906 09/06/17 0935   09/06/17 1600  DAPTOmycin (CUBICIN) 984 mg in sodium chloride 0.9 % IVPB  Status:  Discontinued     8 mg/kg  123 kg 239.4 mL/hr over 30 Minutes Intravenous Every 48 hours 09/06/17 1436 09/07/17 1019   09/06/17 1000  aztreonam (AZACTAM) 0.5 g in dextrose 5 % 50 mL IVPB  Status:  Discontinued     0.5 g 100 mL/hr over 30 Minutes Intravenous Every 8 hours 09/06/17 0943 09/06/17 1223   09/01/2017 2000  metroNIDAZOLE (FLAGYL) IVPB 500 mg  Status:  Discontinued     500 mg 100 mL/hr over 60 Minutes Intravenous Every 8 hours 09/04/2017 1343 09/06/17 0028   08/15/2017 0900  aztreonam (AZACTAM) 2 g in sodium chloride 0.9 % 100 mL IVPB  Status:  Discontinued     2 g 200 mL/hr over 30 Minutes Intravenous Every 8 hours 08/26/2017 0853 09/06/17 0028   08/17/2017 0900  metroNIDAZOLE (FLAGYL) IVPB 500 mg     500 mg 100 mL/hr over 60 Minutes Intravenous  Once 08/11/2017 0853 09/04/2017 1035   08/31/2017 0830  piperacillin-tazobactam (ZOSYN) IVPB 3.375 g  Status:  Discontinued     3.375 g 100 mL/hr over 30 Minutes Intravenous  Once 08/29/2017 0816 09/06/2017 0836   08/29/2017 0830  vancomycin (VANCOCIN) IVPB 1000 mg/200 mL premix  Status:  Discontinued     1,000 mg 200 mL/hr over 60 Minutes Intravenous   Once 08/23/2017 0816 08/21/2017 0822   09/04/2017 0830  vancomycin (VANCOCIN) 2,000 mg in sodium chloride 0.9 % 500 mL IVPB     2,000 mg 250 mL/hr over 120 Minutes Intravenous  Once 08/09/2017 0822 08/13/2017 1342    .  POD/HD#: 2  57 y/o female with numerous medical conditions, MSSA bacteremia, multiple sites of disseminated infection   - L hand abscesses, R index finger felon infection, R thumb infection, R great toe osteo  New veraflow to L wrist at bedside  OR tomorrow for repeat I&Ds  Call with questions regarding vac    - Dispo:  Per Primary      Jari Pigg, PA-C [facility] [phone] (P) [phone] (O) [phone] (C) 09/13/2017, 12:00 PM

## 2017-09-13 NOTE — Progress Notes (Signed)
SLP Cancellation Note  Patient Details Name: Jessica Howard MRN: [id number] DOB: 22-Aug-1960   Cancelled treatment:       Reason Eval/Treat Not Completed: Medical issues which prohibited therapy  [name], [place], [place] Speech-Language Pathologist [phone]  [name] 09/13/2017, 8:15 AM

## 2017-09-13 NOTE — Progress Notes (Signed)
S: no events overnight O:BP (!) 141/53   Pulse (!) 54   Temp 98.1 F (36.7 C) (Oral)   Resp (!) 22   Ht 5' 2"  (1.575 m)   Wt 127.5 kg (281 lb 1.4 oz)   LMP 12/27/2013 Comment: irreggular  SpO2 100%   BMI 51.41 kg/m   Intake/Output Summary (Last 24 hours) at 09/13/2017 1128 Last data filed at 09/13/2017 1100 Gross per 24 hour  Intake 1278.23 ml  Output 2217 ml  Net -938.77 ml   Intake/Output: I/O last 3 completed shifts: In: 2872.1 [I.V.:2367.1; Blood:315; Other:10; NG/GT:180] Out: 9326 [Urine:154; Drains:125; Other:1570]  Intake/Output this shift:  Total I/O In: 202 [I.V.:137; NG/GT:65] Out: 454 [Urine:25; Other:429] Weight change: 2.761 kg (6 lb 1.4 oz) Gen: intubated/sedated CVS: bradycardic at 54 Resp: scattered rhonchi Abd: +BS, soft Ext: trace edema,   Recent Labs  Lab 09/06/17 2349  09/09/17 0432 09/09/17 1004 09/09/17 1600 09/10/17 0436 09/10/17 1806 09/24/2017 0409 09/29/2017 1752 09/12/17 0643 09/12/17 1600 09/13/17 0438  NA 139   < > 136  --  136 135 135  --  136 134* 136 135  K 5.0   < > 4.0  --  3.7 3.9 3.5  --  4.3 4.1 3.7 3.6  CL 108   < > 99*  --  100* 98* 101  --  102 102 103 100*  CO2 15*   < > 22  --  22 22 22   --  22 20* 22 22  GLUCOSE 139*   < > 150*  --  144* 187* 215*  --  214* 175* 159* 142*  BUN 83*   < > 32*  --  31* 24* 25*  --  28* 40* 42* 31*  CREATININE 5.92*   < > 2.55*  --  2.47* 2.31* 2.32*  --  2.24* 3.02* 3.12* 2.38*  ALBUMIN 1.7*   < > 1.5*  1.5* 1.5* 1.4* 1.5* 1.4* 1.4* 1.8* 1.6*  1.6* 1.5* 1.6*  CALCIUM 6.5*   < > 8.1*  --  7.9* 8.4* 8.1*  --  8.1* 7.9* 8.1* 8.0*  PHOS  --    < > 4.7*  --  4.3 4.1 4.3  --  4.2 5.1* 4.3 3.4  AST 114*  --  102* 98*  --   --   --  67*  --  49*  --   --   ALT 34  --  30 30  --   --   --  28  --  24  --   --    < > = values in this interval not displayed.   Liver Function Tests: Recent Labs  Lab 09/09/17 1004  09/15/2017 0409  09/12/17 0643 09/12/17 1600 09/13/17 0438  AST 98*  --  67*  --   49*  --   --   ALT 30  --  28  --  24  --   --   ALKPHOS 541*  --  527*  --  417*  --   --   BILITOT 5.4*  --  5.5*  --  3.8*  --   --   PROT 6.4*  --  6.1*  --  5.8*  --   --   ALBUMIN 1.5*   < > 1.4*   < > 1.6*  1.6* 1.5* 1.6*   < > = values in this interval not displayed.   No results for input(s): LIPASE, AMYLASE in the last 168 hours.  Recent Labs  Lab 09/09/17 1004  AMMONIA 44*   CBC: Recent Labs  Lab 09/08/17 1325 09/09/17 0432 09/10/17 0434 09/17/2017 0409 09/12/17 0435 09/12/17 1039 09/13/17 0439  WBC 24.1* 23.6* 25.3* 20.3* 22.5*  --  17.8*  NEUTROABS 21.7*  --   --   --   --   --   --   HGB 8.1* 8.6* 9.2* 8.0* 6.5* 7.4* 7.3*  HCT 25.7* 27.2* 28.0* 24.5* 19.6* 22.1* 21.8*  MCV 66.6* 65.9* 65.4* 65.2* 65.1*  --  68.3*  PLT 344 347 352 266 272  --  238   Cardiac Enzymes: Recent Labs  Lab 09/10/17 0434 09/13/17 0439  CKTOTAL 131 441*   CBG: Recent Labs  Lab 09/12/17 1559 09/12/17 1918 09/12/17 2343 09/13/17 0316 09/13/17 0734  GLUCAP 138* 143* 148* 127* 128*    Iron Studies: No results for input(s): IRON, TIBC, TRANSFERRIN, FERRITIN in the last 72 hours. Studies/Results: Ct Aspiration  Result Date: 09/12/2017 INDICATION: 57 year old female with sepsis and evidence of a right-sided L1-L2 facet infection with surrounding fluid. She presents for aspiration of the same. EXAM: CT-guided aspiration MEDICATIONS: None ANESTHESIA/SEDATION: None COMPLICATIONS: None immediate. PROCEDURE: Informed written consent was obtained from the patient after a thorough discussion of the procedural risks, benefits and alternatives. All questions were addressed. A timeout was performed prior to the initiation of the procedure. A planning axial CT scan was performed. The right L1-L2 facet was identified. The skin was sterilely prepped and draped in standard fashion using chlorhexidine skin prep. A small dermatotomy was made after obtaining local anesthesia with 1% lidocaine. An 18  gauge trocar needle was carefully advanced into the right L1-L2 facet. Aspiration of the facet in the surrounding soft tissues the ill to less than 1 mL of thick, purulent material. This was diluted in 1 mL of saline and sent to the lab for culture. The patient tolerated the procedure well. IMPRESSION: Successful aspiration of the right sided L1-L2 facet joint. Electronically Signed   By: Jacqulynn Cadet M.D.   On: 09/12/2017 16:48   Dg Chest Port 1 View  Result Date: 09/13/2017 CLINICAL DATA:  Check endotracheal tube EXAM: PORTABLE CHEST 1 VIEW COMPARISON:  09/12/2017 FINDINGS: Endotracheal tube, right jugular central line and nasogastric catheter are again seen and stable. Cardiac shadow remains enlarged but stable. Increasing bilateral pleural effusions are seen with bibasilar atelectatic changes. No bony abnormality is noted. IMPRESSION: Increase in the degree of bilateral effusions and atelectatic changes in the bases. Electronically Signed   By: Inez Catalina M.D.   On: 09/13/2017 07:02   Dg Chest Port 1 View  Result Date: 09/12/2017 CLINICAL DATA:  Intubation EXAM: PORTABLE CHEST 1 VIEW COMPARISON:  09/09/2017 FINDINGS: Endotracheal tube has been placed with tip measuring 3.5 cm above the carina. Enteric tube tip is off the field of view but below the left hemidiaphragm. Right central venous catheter with tip over the cavoatrial junction region. Shallow inspiration with atelectasis in the lung bases. Probable small bilateral pleural effusions. Cardiac enlargement without vascular congestion or edema. No pneumothorax. IMPRESSION: Appliances appear in satisfactory position. Cardiac enlargement. Shallow inspiration with atelectasis in the lung bases. Probable small bilateral pleural effusion. Electronically Signed   By: Lucienne Capers M.D.   On: 09/12/2017 06:56   . chlorhexidine gluconate (MEDLINE KIT)  15 mL Mouth Rinse BID  . Chlorhexidine Gluconate Cloth  6 each Topical Daily  . docusate  100 mg  Per Tube BID  . fentaNYL (SUBLIMAZE) injection  50 mcg Intravenous Once  . insulin aspart  0-15 Units Subcutaneous Q4H  . lactulose  10 g Oral Daily  . mouth rinse  15 mL Mouth Rinse 10 times per day  . pantoprazole (PROTONIX) IV  40 mg Intravenous Q24H  . sennosides  5 mL Per Tube Daily  . sodium chloride flush  3 mL Intravenous Q12H    BMET    Component Value Date/Time   NA 135 09/13/2017 0438   NA 142 03/06/2017 0929   K 3.6 09/13/2017 0438   K 4.1 03/06/2017 0929   CL 100 (L) 09/13/2017 0438   CL 101 02/13/2012 0850   CO2 22 09/13/2017 0438   CO2 23 03/06/2017 0929   GLUCOSE 142 (H) 09/13/2017 0438   GLUCOSE 129 03/06/2017 0929   GLUCOSE 529 (H) 02/13/2012 0850   BUN 31 (H) 09/13/2017 0438   BUN 17.1 03/06/2017 0929   CREATININE 2.38 (H) 09/13/2017 0438   CREATININE 0.8 03/06/2017 0929   CALCIUM 8.0 (L) 09/13/2017 0438   CALCIUM 9.6 03/06/2017 0929   GFRNONAA 22 (L) 09/13/2017 0438   GFRAA 25 (L) 09/13/2017 0438   CBC    Component Value Date/Time   WBC 17.8 (H) 09/13/2017 0439   RBC 3.19 (L) 09/13/2017 0439   HGB 7.3 (L) 09/13/2017 0439   HGB 9.4 (L) 05/16/2017 0937   HCT 21.8 (L) 09/13/2017 0439   HCT 30.4 (L) 05/16/2017 0937   PLT 238 09/13/2017 0439   PLT 266 05/16/2017 0937   MCV 68.3 (L) 09/13/2017 0439   MCV 64.4 (L) 05/16/2017 0937   MCH 22.9 (L) 09/13/2017 0439   MCHC 33.5 09/13/2017 0439   RDW 25.7 (H) 09/13/2017 0439   RDW 17.4 (H) 05/16/2017 0937   LYMPHSABS 1.2 09/08/2017 1325   LYMPHSABS 3.1 05/16/2017 0937   MONOABS 1.2 (H) 09/08/2017 1325   MONOABS 0.8 05/16/2017 0937   EOSABS 0.0 09/08/2017 1325   EOSABS 0.1 05/16/2017 0937   BASOSABS 0.0 09/08/2017 1325   BASOSABS 0.0 05/16/2017 0937    Assessment/Plan:  1. ARF, oliguric- presumably due to ischemic ATN in setting of sepsis and hypotension, however pt with evidence of disseminated MSSA septicemia with osler's node and splinter hemorrhages. Started on CVVHD 09/07/17 and tolerating it  well and was taken off for surgery 09/12/2017 but restarted CVVHD 09/12/17 using all 4K/2.5Ca baths, UF 53m/hr as bp tolerates, no heparin.  Will continue for now and stop prior to surgery tomorrow.   Will re-evaluate restarting after surgery.   2. MSSA septicemia with metastatic infectionseeding of joints/spine, osteo of right great toe. ID following and TEE with possible MV endocarditis but no vegetations seen.  On Daptomycinper ID. Other workup of infected toe underway and ortho following and planning on further debridement Sunday. wound vac in place 3. Tachycardia/A fib with RVR despite amiodarone drip. Appreciate Cardiology consult and s/p DCCV yesterday. 4. Abnormal LFT's- ?shock liver or direct involvement of infection. Liver [place].  5. Anemia of acute illness- follow and transfuse prn 6. Vascular access- RIJ tdc placed 09/07/17 and will need catheter holiday and new HD cath early next week. 7. Rheumatoid arthritis 8. Thalassemia 9. Hypoalbuminemia- due to sepsis   Jessica Potts MD [phone]

## 2017-09-14 ENCOUNTER — Inpatient Hospital Stay (HOSPITAL_COMMUNITY): Payer: Medicare Other

## 2017-09-14 ENCOUNTER — Encounter (HOSPITAL_COMMUNITY): Admission: EM | Disposition: E | Payer: Self-pay | Source: Home / Self Care | Attending: Internal Medicine

## 2017-09-14 ENCOUNTER — Inpatient Hospital Stay (HOSPITAL_COMMUNITY): Payer: Medicare Other | Admitting: Anesthesiology

## 2017-09-14 ENCOUNTER — Encounter (HOSPITAL_COMMUNITY): Payer: Self-pay | Admitting: Certified Registered"

## 2017-09-14 HISTORY — PX: AMPUTATION TOE: SHX6595

## 2017-09-14 HISTORY — PX: I&D EXTREMITY: SHX5045

## 2017-09-14 LAB — TYPE AND SCREEN
ABO/RH(D): A POS
Antibody Screen: NEGATIVE
Donor AG Type: NEGATIVE
Donor AG Type: NEGATIVE
PT AG Type: NEGATIVE
Unit division: 0
Unit division: 0

## 2017-09-14 LAB — PHOSPHORUS: Phosphorus: 3.4 mg/dL (ref 2.5–4.6)

## 2017-09-14 LAB — GLUCOSE, CAPILLARY
GLUCOSE-CAPILLARY: 118 mg/dL — AB (ref 65–99)
GLUCOSE-CAPILLARY: 121 mg/dL — AB (ref 65–99)
GLUCOSE-CAPILLARY: 128 mg/dL — AB (ref 65–99)
GLUCOSE-CAPILLARY: 129 mg/dL — AB (ref 65–99)
Glucose-Capillary: 110 mg/dL — ABNORMAL HIGH (ref 65–99)
Glucose-Capillary: 129 mg/dL — ABNORMAL HIGH (ref 65–99)
Glucose-Capillary: 118 mg/dL — ABNORMAL HIGH (ref 65–99)

## 2017-09-14 LAB — RENAL FUNCTION PANEL
Albumin: 1.6 g/dL — ABNORMAL LOW (ref 3.5–5.0)
Anion gap: 11 (ref 5–15)
BUN: 18 mg/dL (ref 6–20)
CHLORIDE: 99 mmol/L — AB (ref 101–111)
CO2: 23 mmol/L (ref 22–32)
Calcium: 7.8 mg/dL — ABNORMAL LOW (ref 8.9–10.3)
Creatinine, Ser: 1.65 mg/dL — ABNORMAL HIGH (ref 0.44–1.00)
GFR calc Af Amer: 39 mL/min — ABNORMAL LOW (ref >60)
GFR, EST NON AFRICAN AMERICAN: 34 mL/min — AB (ref >60)
Glucose, Bld: 130 mg/dL — ABNORMAL HIGH (ref 65–99)
POTASSIUM: 4.1 mmol/L (ref 3.5–5.1)
Phosphorus: 3.4 mg/dL (ref 2.5–4.6)
Sodium: 133 mmol/L — ABNORMAL LOW (ref 135–145)

## 2017-09-14 LAB — CBC
HEMATOCRIT: 20.7 % — AB (ref 36.0–46.0)
HEMOGLOBIN: 6.8 g/dL — AB (ref 12.0–15.0)
MCH: 22.7 pg — ABNORMAL LOW (ref 26.0–34.0)
MCHC: 32.9 g/dL (ref 30.0–36.0)
MCV: 69 fL — AB (ref 78.0–100.0)
Platelets: 197 10*3/uL (ref 150–400)
RBC: 3 MIL/uL — ABNORMAL LOW (ref 3.87–5.11)
RDW: 25.5 % — ABNORMAL HIGH (ref 11.5–15.5)
WBC: 15.2 10*3/uL — AB (ref 4.0–10.5)

## 2017-09-14 LAB — POCT I-STAT 3, VENOUS BLOOD GAS (G3P V)
Acid-base deficit: 1 mmol/L (ref 0.0–2.0)
BICARBONATE: 25.1 mmol/L (ref 20.0–28.0)
O2 SAT: 61 %
PO2 VEN: 33 mmHg (ref 32.0–45.0)
Patient temperature: 97.9
TCO2: 27 mmol/L (ref 22–32)
pCO2, Ven: 45.5 mmHg (ref 44.0–60.0)
pH, Ven: 7.349 (ref 7.250–7.430)

## 2017-09-14 LAB — BPAM RBC
Blood Product Expiration Date: 201904262359
Blood Product Expiration Date: 201904262359
ISSUE DATE / TIME: 201904050640
Unit Type and Rh: 6200
Unit Type and Rh: 6200

## 2017-09-14 LAB — MAGNESIUM: MAGNESIUM: 2.4 mg/dL (ref 1.7–2.4)

## 2017-09-14 LAB — TRIGLYCERIDES: TRIGLYCERIDES: 921 mg/dL — AB (ref <150)

## 2017-09-14 LAB — PREPARE RBC (CROSSMATCH)

## 2017-09-14 SURGERY — IRRIGATION AND DEBRIDEMENT EXTREMITY
Anesthesia: General | Site: Toe | Laterality: Right

## 2017-09-14 MED ORDER — SODIUM CHLORIDE 0.9 % IV SOLN
INTRAVENOUS | Status: DC | PRN
  Administered 2017-09-14 (×2): via INTRAVENOUS

## 2017-09-14 MED ORDER — SODIUM CHLORIDE 0.9 % IR SOLN
Status: DC | PRN
  Administered 2017-09-14 (×5): 3000 mL
  Administered 2017-09-14: 1000 mL

## 2017-09-14 MED ORDER — ONDANSETRON HCL 4 MG/2ML IJ SOLN
INTRAMUSCULAR | Status: DC | PRN
  Administered 2017-09-14: 4 mg via INTRAVENOUS

## 2017-09-14 MED ORDER — ROCURONIUM BROMIDE 10 MG/ML (PF) SYRINGE
PREFILLED_SYRINGE | INTRAVENOUS | Status: AC
  Filled 2017-09-14: qty 5

## 2017-09-14 MED ORDER — ACETAMINOPHEN 160 MG/5ML PO SOLN
650.0000 mg | Freq: Four times a day (QID) | ORAL | Status: DC | PRN
  Administered 2017-09-14: 650 mg via ORAL
  Filled 2017-09-14: qty 20.3

## 2017-09-14 MED ORDER — SODIUM CHLORIDE 0.9 % IV SOLN
Freq: Once | INTRAVENOUS | Status: AC
  Administered 2017-09-14: 04:00:00 via INTRAVENOUS

## 2017-09-14 MED ORDER — PROPOFOL 10 MG/ML IV BOLUS
INTRAVENOUS | Status: AC
Start: 2017-09-14 — End: ?
  Filled 2017-09-14: qty 20

## 2017-09-14 MED ORDER — FENTANYL CITRATE (PF) 250 MCG/5ML IJ SOLN
INTRAMUSCULAR | Status: AC
  Filled 2017-09-14: qty 5

## 2017-09-14 MED ORDER — FENTANYL CITRATE (PF) 250 MCG/5ML IJ SOLN
INTRAMUSCULAR | Status: DC | PRN
  Administered 2017-09-14 (×3): 50 ug via INTRAVENOUS

## 2017-09-14 MED ORDER — HEPARIN (PORCINE) IN NACL 100-0.45 UNIT/ML-% IJ SOLN
2050.0000 [IU]/h | INTRAMUSCULAR | Status: DC
  Administered 2017-09-14: 1450 [IU]/h via INTRAVENOUS
  Administered 2017-09-15: 1750 [IU]/h via INTRAVENOUS
  Filled 2017-09-14 (×6): qty 250

## 2017-09-14 MED ORDER — SUCCINYLCHOLINE CHLORIDE 200 MG/10ML IV SOSY
PREFILLED_SYRINGE | INTRAVENOUS | Status: AC
  Filled 2017-09-14: qty 10

## 2017-09-14 MED ORDER — PHENYLEPHRINE 40 MCG/ML (10ML) SYRINGE FOR IV PUSH (FOR BLOOD PRESSURE SUPPORT)
PREFILLED_SYRINGE | INTRAVENOUS | Status: DC | PRN
  Administered 2017-09-14: 80 ug via INTRAVENOUS

## 2017-09-14 MED ORDER — LIDOCAINE 2% (20 MG/ML) 5 ML SYRINGE
INTRAMUSCULAR | Status: AC
  Filled 2017-09-14: qty 5

## 2017-09-14 MED ORDER — 0.9 % SODIUM CHLORIDE (POUR BTL) OPTIME
TOPICAL | Status: DC | PRN
  Administered 2017-09-14: 1000 mL

## 2017-09-14 MED ORDER — ROCURONIUM BROMIDE 10 MG/ML (PF) SYRINGE
PREFILLED_SYRINGE | INTRAVENOUS | Status: DC | PRN
  Administered 2017-09-14: 30 mg via INTRAVENOUS
  Administered 2017-09-14: 20 mg via INTRAVENOUS

## 2017-09-14 MED ORDER — SODIUM CHLORIDE 0.9 % IV SOLN: 1.0000 mg/h | INTRAVENOUS | Status: DC

## 2017-09-14 MED ORDER — PHENYLEPHRINE 40 MCG/ML (10ML) SYRINGE FOR IV PUSH (FOR BLOOD PRESSURE SUPPORT)
PREFILLED_SYRINGE | INTRAVENOUS | Status: AC
  Filled 2017-09-14: qty 10

## 2017-09-14 SURGICAL SUPPLY — 49 items
BANDAGE ACE 3X5.8 VEL STRL LF (GAUZE/BANDAGES/DRESSINGS) ×2 IMPLANT
BANDAGE ACE 4X5 VEL STRL LF (GAUZE/BANDAGES/DRESSINGS) ×8 IMPLANT
BLADE SURG 15 STRL LF DISP TIS (BLADE) ×1 IMPLANT
BLADE SURG 15 STRL SS (BLADE) ×4
BNDG GAUZE ELAST 4 BULKY (GAUZE/BANDAGES/DRESSINGS) ×6 IMPLANT
CANISTER WOUND CARE 500ML ATS (WOUND CARE) ×2 IMPLANT
CASSETTE VERAFLO VERALINK (MISCELLANEOUS) ×3 IMPLANT
COVER SURGICAL LIGHT HANDLE (MISCELLANEOUS) ×7 IMPLANT
CUFF TOURNIQUET SINGLE 34IN LL (TOURNIQUET CUFF) ×3 IMPLANT
DRAIN PENROSE 1/4X12 LTX STRL (WOUND CARE) ×2 IMPLANT
DRSG MEPITEL 4X7.2 (GAUZE/BANDAGES/DRESSINGS) ×3 IMPLANT
ELECT REM PT RETURN 9FT ADLT (ELECTROSURGICAL) ×4
ELECTRODE REM PT RTRN 9FT ADLT (ELECTROSURGICAL) ×1 IMPLANT
GAUZE SPONGE 4X4 12PLY STRL (GAUZE/BANDAGES/DRESSINGS) ×7 IMPLANT
GAUZE SPONGE 4X4 16PLY XRAY LF (GAUZE/BANDAGES/DRESSINGS) ×14 IMPLANT
GAUZE XEROFORM 1X8 LF (GAUZE/BANDAGES/DRESSINGS) ×7 IMPLANT
GAUZE XEROFORM 5X9 LF (GAUZE/BANDAGES/DRESSINGS) ×2 IMPLANT
GLOVE BIOGEL PI ORTHO PRO SZ8 (GLOVE) ×6
GLOVE ORTHO TXT STRL SZ7.5 (GLOVE) ×4 IMPLANT
GLOVE PI ORTHO PRO STRL SZ8 (GLOVE) ×4 IMPLANT
GLOVE SURG ORTHO 8.0 STRL STRW (GLOVE) ×8 IMPLANT
GLOVE SURG SS PI 7.5 STRL IVOR (GLOVE) ×2 IMPLANT
GLOVE SURG SS PI 8.0 STRL IVOR (GLOVE) ×2 IMPLANT
GOWN STRL REUS W/ TWL LRG LVL3 (GOWN DISPOSABLE) ×2 IMPLANT
GOWN STRL REUS W/TWL LRG LVL3 (GOWN DISPOSABLE) ×8
KIT BASIN OR (CUSTOM PROCEDURE TRAY) ×4 IMPLANT
KIT TURNOVER KIT B (KITS) ×4 IMPLANT
MANIFOLD NEPTUNE II (INSTRUMENTS) ×8 IMPLANT
NS IRRIG 1000ML POUR BTL (IV SOLUTION) ×4 IMPLANT
PACK ORTHO EXTREMITY (CUSTOM PROCEDURE TRAY) ×4 IMPLANT
PAD ABD 8X10 STRL (GAUZE/BANDAGES/DRESSINGS) ×8 IMPLANT
PAD ARMBOARD 7.5X6 YLW CONV (MISCELLANEOUS) ×6 IMPLANT
PAD CAST 3X4 CTTN HI CHSV (CAST SUPPLIES) ×1 IMPLANT
PAD NEG PRESSURE SENSATRAC (MISCELLANEOUS) ×2 IMPLANT
PADDING CAST COTTON 3X4 STRL (CAST SUPPLIES) ×4
SET CYSTO W/LG BORE CLAMP LF (SET/KITS/TRAYS/PACK) ×6 IMPLANT
SPLINT FIBERGLASS 3X12 (CAST SUPPLIES) ×2 IMPLANT
SPONGE LAP 18X18 X RAY DECT (DISPOSABLE) ×7 IMPLANT
STOCKINETTE IMPERVIOUS 9X36 MD (GAUZE/BANDAGES/DRESSINGS) ×4 IMPLANT
SUCTION FRAZIER HANDLE 10FR (MISCELLANEOUS) ×2
SUCTION TUBE FRAZIER 10FR DISP (MISCELLANEOUS) ×2 IMPLANT
SUT ETHILON 3 0 PS 1 (SUTURE) ×8 IMPLANT
TOWEL OR 17X24 6PK STRL BLUE (TOWEL DISPOSABLE) ×6 IMPLANT
TOWEL OR 17X26 10 PK STRL BLUE (TOWEL DISPOSABLE) ×4 IMPLANT
TUBE CONNECTING 12'X1/4 (SUCTIONS) ×2
TUBE CONNECTING 12X1/4 (SUCTIONS) ×4 IMPLANT
UNDERPAD 30X30 (UNDERPADS AND DIAPERS) ×8 IMPLANT
WATER STERILE IRR 1000ML POUR (IV SOLUTION) ×4 IMPLANT
YANKAUER SUCT BULB TIP NO VENT (SUCTIONS) ×4 IMPLANT

## 2017-09-14 NOTE — Progress Notes (Signed)
The patient has been re-examined, and the chart reviewed, and there have been interval changes have been noted.  [place] of RUE negative for abscess in palm.    The risks, benefits, and alternatives have been discussed at length, with the family and they are willing to proceed.  Repeat I&D and amputation of right great toe planned.

## 2017-09-14 NOTE — Progress Notes (Signed)
PHARMACY - PHYSICIAN COMMUNICATION CRITICAL VALUE ALERT - BLOOD CULTURE IDENTIFICATION (BCID)  Assessment:  57 y.o. female on daptomycin for disseminated MSSA.   Cultures from 4/5 show staph aureus no change from previous cultures. ID is also following.  Current antibiotics: Daptomycin 8mg /kg q48hr  Changes to prescribed antibiotics recommended:  Patient is on recommended antibiotics - No changes needed  Results for orders placed or performed during the hospital encounter of 08/28/2017  Blood Culture ID Panel (Reflexed) (Collected: 08/26/2017  1:54 PM)  Result Value Ref Range   Enterococcus species NOT DETECTED NOT DETECTED   Listeria monocytogenes NOT DETECTED NOT DETECTED   Staphylococcus species DETECTED (A) NOT DETECTED   Staphylococcus aureus DETECTED (A) NOT DETECTED   Methicillin resistance NOT DETECTED NOT DETECTED   Streptococcus species NOT DETECTED NOT DETECTED   Streptococcus agalactiae NOT DETECTED NOT DETECTED   Streptococcus pneumoniae NOT DETECTED NOT DETECTED   Streptococcus pyogenes NOT DETECTED NOT DETECTED   Acinetobacter baumannii NOT DETECTED NOT DETECTED   Enterobacteriaceae species NOT DETECTED NOT DETECTED   Enterobacter cloacae complex NOT DETECTED NOT DETECTED   Escherichia coli NOT DETECTED NOT DETECTED   Klebsiella oxytoca NOT DETECTED NOT DETECTED   Klebsiella pneumoniae NOT DETECTED NOT DETECTED   Proteus species NOT DETECTED NOT DETECTED   Serratia marcescens NOT DETECTED NOT DETECTED   Haemophilus influenzae NOT DETECTED NOT DETECTED   Neisseria meningitidis NOT DETECTED NOT DETECTED   Pseudomonas aeruginosa NOT DETECTED NOT DETECTED   Candida albicans NOT DETECTED NOT DETECTED   Candida glabrata NOT DETECTED NOT DETECTED   Candida krusei NOT DETECTED NOT DETECTED   Candida parapsilosis NOT DETECTED NOT DETECTED   Candida tropicalis NOT DETECTED NOT DETECTED    Hildred Laser, PharmD Clinical Pharmacist 10/06/2017 6:38 PM

## 2017-09-14 NOTE — Progress Notes (Signed)
S:Pt went to OR today with extensive debridement and irrigation of her bilateral UEs with extensive infection and damage per Dr. Amedeo Plenty and Mardelle Matte. O:BP (!) 137/58   Pulse 66   Temp 98.9 F (37.2 C) (Oral)   Resp 20   Ht '5\' 2"'$  (1.575 m)   Wt 127.5 kg (281 lb 1.4 oz)   LMP 12/27/2013 Comment: irreggular  SpO2 100%   BMI 51.41 kg/m   Intake/Output Summary (Last 24 hours) at 09/09/2017 1215 Last data filed at 10/06/2017 1212 Gross per 24 hour  Intake 2144.55 ml  Output 2535 ml  Net -390.45 ml   Intake/Output: I/O last 3 completed shifts: In: 2467.1 [I.V.:1888.3; Blood:393.8; NG/GT:185] Out: 5361 [Urine:218; Drains:115; WERXV:4008]  Intake/Output this shift:  Total I/O In: 660.5 [I.V.:660.5] Out: 110 [Urine:60; Blood:50] Weight change: 0 kg (0 lb) Gen: Intubated/sedated  Recent Labs  Lab 09/09/17 0432 09/09/17 1004  09/10/17 1806 09/10/2017 0409 09/15/2017 1752 09/12/17 0643 09/12/17 1600 09/13/17 0438 09/13/17 1633 09/22/2017 0313  NA 136  --    < > 135  --  136 134* 136 135 134* 133*  K 4.0  --    < > 3.5  --  4.3 4.1 3.7 3.6 3.9 4.1  CL 99*  --    < > 101  --  102 102 103 100* 100* 99*  CO2 22  --    < > 22  --  22 20* '22 22 23 23  '$ GLUCOSE 150*  --    < > 215*  --  214* 175* 159* 142* 157* 130*  BUN 32*  --    < > 25*  --  28* 40* 42* 31* 23* 18  CREATININE 2.55*  --    < > 2.32*  --  2.24* 3.02* 3.12* 2.38* 1.89* 1.65*  ALBUMIN 1.5*  1.5* 1.5*   < > 1.4* 1.4* 1.8* 1.6*  1.6* 1.5* 1.6* 1.6* 1.6*  CALCIUM 8.1*  --    < > 8.1*  --  8.1* 7.9* 8.1* 8.0* 7.9* 7.8*  PHOS 4.7*  --    < > 4.3  --  4.2 5.1* 4.3 3.4 3.3 3.4  3.4  AST 102* 98*  --   --  67*  --  49*  --   --   --   --   ALT 30 30  --   --  28  --  24  --   --   --   --    < > = values in this interval not displayed.   Liver Function Tests: Recent Labs  Lab 09/09/17 1004  09/13/2017 0409  09/12/17 0643  09/13/17 0438 09/13/17 1633 09/27/2017 0313  AST 98*  --  67*  --  49*  --   --   --   --   ALT 30  --   28  --  24  --   --   --   --   ALKPHOS 541*  --  527*  --  417*  --   --   --   --   BILITOT 5.4*  --  5.5*  --  3.8*  --   --   --   --   PROT 6.4*  --  6.1*  --  5.8*  --   --   --   --   ALBUMIN 1.5*   < > 1.4*   < > 1.6*  1.6*   < > 1.6* 1.6* 1.6*   < > =  values in this interval not displayed.   No results for input(s): LIPASE, AMYLASE in the last 168 hours. Recent Labs  Lab 09/09/17 1004  AMMONIA 44*   CBC: Recent Labs  Lab 09/08/17 1325  09/10/17 0434 10/05/2017 0409 09/12/17 0435 09/12/17 1039 09/13/17 0439 09/26/2017 0313  WBC 24.1*   < > 25.3* 20.3* 22.5*  --  17.8* 15.2*  NEUTROABS 21.7*  --   --   --   --   --   --   --   HGB 8.1*   < > 9.2* 8.0* 6.5* 7.4* 7.3* 6.8*  HCT 25.7*   < > 28.0* 24.5* 19.6* 22.1* 21.8* 20.7*  MCV 66.6*   < > 65.4* 65.2* 65.1*  --  68.3* 69.0*  PLT 344   < > 352 266 272  --  238 197   < > = values in this interval not displayed.   Cardiac Enzymes: Recent Labs  Lab 09/10/17 0434 09/13/17 0439  CKTOTAL 131 441*   CBG: Recent Labs  Lab 09/13/17 2004 09/19/2017 0012 09/29/2017 0316 10/06/2017 0633 09/13/2017 0731  GLUCAP 131* 118* 128* 110* 121*    Iron Studies: No results for input(s): IRON, TIBC, TRANSFERRIN, FERRITIN in the last 72 hours. Studies/Results: Ct Aspiration  Result Date: 09/12/2017 INDICATION: 57 year old female with sepsis and evidence of a right-sided L1-L2 facet infection with surrounding fluid. She presents for aspiration of the same. EXAM: CT-guided aspiration MEDICATIONS: None ANESTHESIA/SEDATION: None COMPLICATIONS: None immediate. PROCEDURE: Informed written consent was obtained from the patient after a thorough discussion of the procedural risks, benefits and alternatives. All questions were addressed. A timeout was performed prior to the initiation of the procedure. A planning axial CT scan was performed. The right L1-L2 facet was identified. The skin was sterilely prepped and draped in standard fashion using  chlorhexidine skin prep. A small dermatotomy was made after obtaining local anesthesia with 1% lidocaine. An 18 gauge trocar needle was carefully advanced into the right L1-L2 facet. Aspiration of the facet in the surrounding soft tissues the ill to less than 1 mL of thick, purulent material. This was diluted in 1 mL of saline and sent to the lab for culture. The patient tolerated the procedure well. IMPRESSION: Successful aspiration of the right sided L1-L2 facet joint. Electronically Signed   By: Jacqulynn Cadet M.D.   On: 09/12/2017 16:48   Dg Chest Port 1 View  Result Date: 09/30/2017 CLINICAL DATA:  Acute respiratory failure EXAM: PORTABLE CHEST 1 VIEW COMPARISON:  09/13/2017 FINDINGS: Endotracheal tube in good position. Right jugular catheter tip in the right atrium unchanged. NG tube enters the stomach with the tip not visualized. Diffuse bilateral airspace disease with mild improvement. Bibasilar atelectasis and effusion unchanged. No pneumothorax IMPRESSION: Support lines remain in satisfactory position Mild improvement in diffuse bilateral airspace disease. Bibasilar atelectasis and effusion unchanged. Electronically Signed   By: Franchot Gallo M.D.   On: 09/13/2017 08:15   Dg Chest Port 1 View  Result Date: 09/13/2017 CLINICAL DATA:  Check endotracheal tube EXAM: PORTABLE CHEST 1 VIEW COMPARISON:  09/12/2017 FINDINGS: Endotracheal tube, right jugular central line and nasogastric catheter are again seen and stable. Cardiac shadow remains enlarged but stable. Increasing bilateral pleural effusions are seen with bibasilar atelectatic changes. No bony abnormality is noted. IMPRESSION: Increase in the degree of bilateral effusions and atelectatic changes in the bases. Electronically Signed   By: Inez Catalina M.D.   On: 09/13/2017 07:02   [place] Lt Upper Extrem Ltd  Soft Tissue Non Vascular  Result Date: 09/13/2017 CLINICAL DATA:  Severe sepsis and infection of the hand, survey for complex fluid  collections along the volar hand and wrist. EXAM: ULTRASOUND left UPPER EXTREMITY LIMITED TECHNIQUE: Ultrasound examination of the upper extremity soft tissues was performed in the area of clinical concern. COMPARISON:  Radiographs from 09/04/2017 FINDINGS: The patient had recent irrigation and debridement of the radiocarpal joint and midcarpal joint demonstrating septic arthritis, and has continued severe swelling and concern for recurrent abscesses. In the volar hand extending from the base of the fingers through the wrist, there is abnormal cutaneous and subcutaneous edema but without a definite abscess or fluid-filled pocket identified. IMPRESSION: 1. No drainable abscess is identified by ultrasound survey of the volar left wrist through the fingers. There is notable subcutaneous edema favoring cellulitis. Electronically Signed   By: Van Clines M.D.   On: 09/13/2017 16:39   . chlorhexidine gluconate (MEDLINE KIT)  15 mL Mouth Rinse BID  . Chlorhexidine Gluconate Cloth  6 each Topical Daily  . docusate  100 mg Per Tube BID  . insulin aspart  0-15 Units Subcutaneous Q4H  . lactulose  10 g Oral Daily  . mouth rinse  15 mL Mouth Rinse 10 times per day  . pantoprazole (PROTONIX) IV  40 mg Intravenous Q24H  . sennosides  5 mL Per Tube Daily  . sodium chloride flush  3 mL Intravenous Q12H    BMET    Component Value Date/Time   NA 133 (L) 10/03/2017 0313   NA 142 03/06/2017 0929   K 4.1 10/06/2017 0313   K 4.1 03/06/2017 0929   CL 99 (L) 09/26/2017 0313   CL 101 02/13/2012 0850   CO2 23 09/15/2017 0313   CO2 23 03/06/2017 0929   GLUCOSE 130 (H) 09/28/2017 0313   GLUCOSE 129 03/06/2017 0929   GLUCOSE 529 (H) 02/13/2012 0850   BUN 18 09/13/2017 0313   BUN 17.1 03/06/2017 0929   CREATININE 1.65 (H) 09/10/2017 0313   CREATININE 0.8 03/06/2017 0929   CALCIUM 7.8 (L) 09/28/2017 0313   CALCIUM 9.6 03/06/2017 0929   GFRNONAA 34 (L) 09/28/2017 0313   GFRAA 39 (L) 10/06/2017 0313    CBC    Component Value Date/Time   WBC 15.2 (H) 09/24/2017 0313   RBC 3.00 (L) 09/27/2017 0313   HGB 6.8 (LL) 09/27/2017 0313   HGB 9.4 (L) 05/16/2017 0937   HCT 20.7 (L) 09/19/2017 0313   HCT 30.4 (L) 05/16/2017 0937   PLT 197 09/26/2017 0313   PLT 266 05/16/2017 0937   MCV 69.0 (L) 09/10/2017 0313   MCV 64.4 (L) 05/16/2017 0937   MCH 22.7 (L) 09/15/2017 0313   MCHC 32.9 10/06/2017 0313   RDW 25.5 (H) 09/15/2017 0313   RDW 17.4 (H) 05/16/2017 0937   LYMPHSABS 1.2 09/08/2017 1325   LYMPHSABS 3.1 05/16/2017 0937   MONOABS 1.2 (H) 09/08/2017 1325   MONOABS 0.8 05/16/2017 0937   EOSABS 0.0 09/08/2017 1325   EOSABS 0.1 05/16/2017 0937   BASOSABS 0.0 09/08/2017 1325   BASOSABS 0.0 05/16/2017 0937    Assessment/Plan:  1. ARF, oliguric- presumably due to ischemic ATN in setting of sepsis and hypotension, however pt with evidence of disseminated MSSA septicemia with osler's node and splinter hemorrhages. Started on CVVHD 09/07/17 and tolerating it well and was taken off for surgery 09/13/2017 but restarted CVVHD 09/12/17 using all 4K/2.5Ca baths, UF 88m/hr as bp tolerates, no heparin.   1. Will continue  to hold CVVHD for now.    2. Will re-evaluate restarting CVVHD vs. IHD tomorrow and will need new HD cath given ongoing infection and possible cath holiday.   2. MSSA septicemia with metastatic infectionseeding of joints/spine, osteo of right great toe. ID following and TEEwith possible MV endocarditis but no vegetations seen.On Daptomycinper ID. Other workup of infected toe underway and ortho following and planning on further debridement Sunday. wound vac in place 3. Tachycardia/A fib with RVR despite amiodarone drip. AppreciateCardiology consult and s/p DCCV yesterday. 4. Abnormal LFT's- ?shock liver or direct involvement of infection. Liver US negative.  5. Anemia of acute illness- follow and transfuse prn 6. Vascular access- RIJ tdc placed 09/07/17 and will need catheter  holiday and new HD cath this week and may need cath holiday.  Consider removing today and replacing tomorrow or Tuesday. 7. Rheumatoid arthritis  8. Thalassemia 9. Hypoalbuminemia- due to sepsis   Donetta Potts, MD [facility] [phone]

## 2017-09-14 NOTE — Op Note (Signed)
Operative note 10/04/2017  Preoperative diagnosis: Massive infection left wrist and hand as well as distal forearm left upper extremity to include bone tendon joint and soft tissues including the hypothenar space.  Postop diagnosis: Same  Procedure: #1 irrigation and debridement  skin scans obtained tissue muscle tendon bone and capsular tissue.  This was an excisional debridement with curette scalpel and knife blade about the forearm wrist and hand. #2 radical extensor tendon tenosynovectomy with decompression of the sheaths including the second third fourth fifth and 6 dorsal compartments.  This included the EPL, ECRB, ECRL, EDC, EDM, and ECU. #3 arthrotomy synovectomy extensive distal radial ulnar joint, midcarpal joint, radiocarpal joint, CMC joints left wrist #4 dorsal sensory ulnar nerve neurolysis extensive in nature left wrist and hand #5 fasciotomy dorsal forearm left #6 hypo-thenar space infection decompression debridement about the left hand  #7 application of vacuum-assisted closure device dorsal hand #8 thenar space irrigation and debridement thumb and hand left upper extremity  Co-surgeons Roseanne Kaufman and [name]: Assistant: Angelena Sole PA-C   Anesthesia General  Description of the procedure: Patient was brought to the operative theater and underwent a general anesthetic.  The patient had multiple areas of radical/massive infection about her right hand right lower extremity and left wrist attended to.  This required multiple surgeon and physician assistant efforts.  Dr. Mardelle Matte will dictate the right upper extremity and right lower extremity findings and operative notes.  The left upper extremity will be dictated by myself.  Patient had the left upper extremity isolated and prepped and draped in usual sterile fashion.  The patient had significant reaccumulation of infectious material noted from prior incisions made at her first irrigation and debridement event.  Once  timeout was observed and sterile field was applied and secured the patient and underwent a irrigation debridement cursory in nature.  It was quite clear that she would need an aggressive debridement on today's date.  We began the surgical measures with a dorsal sensory nerve neuro lysis given the fact that it was encased in inflammatory and infectious material.  The branching pattern distally was carefully protected.  At this time we identified the EDM and ECU tendons.  We performed a fasciotomy and tendon sheath release.  This was a fasciotomy of the dorsal compartment and a tendon sheath release.  This allowed access to the distal radial ulnar joint.  The distal radial ulnar joint underwent arthrotomy and synovectomy without difficulty.  Massive amounts of infectious material were present.  We then looked into the radiocarpal joint and debrided devitalized necrotic tissue about the TFC region.  I very carefully snuck over and debrided the piece of triquetral joint region as well.  The radiocarpal joint was also debrided in the arthrotomy sign of ectomy procedure portion about the region of the lunate as well.  Following this attention was dressed towards the mid carpal joint and the [facility] joint both of these areas underwent irrigation debridement and radical removal of devitalized necrotic and pre-necrotic tissue.  Following this we then performed a careful evaluation distally and noted the hyperthenar space to have a significant amount of infectious material in it.  This extension of the incision was made and a hypo-thenar space abscess was decompressed and aggressively removed in terms of necrotic tissue.  We were able to get back to good muscle and musculotendinous tissue as it emanated distally.  Following this we then very carefully and cautiously performed a fourth dorsal compartment tenosynovectomy.  I trimmed up a  portion of the juncture which was traumatized during this portion of the procedure.  We  cauterize crossing veins.  The patient had a volar inferior vein was quite significant which was cauterized.  I did not enter into the canal of Guyon.  Following this attention was turned towards the radial aspect where prior incision had been made.  There is a 2 inch skin bridge between the incisions and this appeared to be healthy we should note when tourniquet was deflated during multiple portions of the procedure.  At this time the EPL ECRB and ECRL were decompressed and underwent tenosynovectomy.  Following this midcarpal and radiocarpal joints were very carefully and cautiously irrigated and debrided with a rondure scissor and knife.  Following this the thenar space and first dorsal interosseous underwent debridement as well the thenar space had an incision made and the incision was carried down and the muscle fibers carefully evaluated.  This looked to be stable.  Thus the patient had a radical tenosynovectomy of the second through sixth dorsal compartments without retinacular transposition.  Arthrotomy synovectomy of distal radial ulnar joint, radiocarpal joint, midcarpal joint and the CMC joint of the fifth fourth Jefferson region.  In addition to this dorsal sensory branch of the ulnar nerve is carefully neurolysed and protected throughout the procedure.  In addition to this the patient underwent a very careful and cautious thenar and hyperthenar space debridement with the hyperthenar space being very significantly involved requiring debridement of not only the space itself but devitalized muscle tissue.  Throughout the case we had various tourniquets applied but none greater than an hour.  The patient's debridement looked well and at the conclusion of the debridement greater than 9 L of saline were placed through the area.  Following this a vacuum-assisted closure device with irrigant capabilities was then placed   The patient tolerated the procedure well.  Moving forward one has to query the  potential for resolution of this massive infection.  Nevertheless we are going to do everything in our capabilities to try to give her a stable outcome.  She does in my opinion have significant findings to note osteomyelitis about the proximal and mid carpal row.  Unfortunately there were erosions about the scaphoid and the capitate indicative of osteomyelitic focus.  If we could eradicate her infection and treat the osteomyelitis then perhaps this can calm down and she could return to the operative theater in the very distant future for some type of salvage effort if necessary.  My hunch is that she will likely get very stiff if she can survive this in terms of her hand wrist and forearm function.  She will need aggressive antibiotic resuscitation efforts as well as aggressive surgical debridement to try to give her some degree of infection resolution and ultimately coverage.  This is an absolutely tremendous infection and one that will require multiple debridements and very aggressive efforts to try and curtail.  It is a quite unfortunate infection and one that certainly has are most concerned.  It was a pleasure to dissipate in this lady's care.  She will return to the operative theater Tuesday.  Her additional operative procedures will be dictated by Dr. Hilton Sinclair MD

## 2017-09-14 NOTE — Progress Notes (Signed)
CRITICAL VALUE ALERT  Critical Value:  Hgb= 6.8  Date & Time Notied:  10/03/2017 @ 0337  Provider Notified: Dr Oletta Darter  Orders Received/Actions taken: transfuse 1 unit PRBC's

## 2017-09-14 NOTE — Progress Notes (Signed)
ANTICOAGULATION CONSULT NOTE - Follow Up Consult  Pharmacy Consult for Heparin Indication: atrial fibrillation  Allergies  Allergen Reactions  . Bactrim [Sulfamethoxazole-Trimethoprim] Hives  . Cefuroxime Axetil Other (See Comments)    Blistering wounds per family  . Cephalosporins Other (See Comments)    Blistering wounds as an adult per family  . Lisinopril Cough  . Penicillins Hives  . Iohexol Itching and Rash     Code: RASH, Desc: HAD ITCHING AND A RASH ABOUT ONE HOUR AFTER RETURNING HOME FROM THE CT, Onset Date: [id number]   . Sulfa Antibiotics Hives    Patient Measurements: Height: 5\' 2"  (157.5 cm) Weight: 281 lb 1.4 oz (127.5 kg) IBW/kg (Calculated) : 50.1 Heparin Dosing Weight: 81 kg  Vital Signs: Temp: 98.9 F (37.2 C) (04/07 0743) Temp Source: Oral (04/07 0743) BP: 131/67 (04/07 1400) Pulse Rate: 68 (04/07 1400)  Labs: Recent Labs    09/12/17 0435  09/12/17 1039  09/13/17 0438 09/13/17 0439 09/13/17 1633 09/09/2017 0313  HGB 6.5*  --  7.4*  --   --  7.3*  --  6.8*  HCT 19.6*  --  22.1*  --   --  21.8*  --  20.7*  PLT 272  --   --   --   --  238  --  197  LABPROT 18.2*  --   --   --   --   --   --   --   INR 1.52  --   --   --   --   --   --   --   HEPARINUNFRC 0.14*  --   --   --   --   --   --   --   CREATININE  --    < >  --    < > 2.38*  --  1.89* 1.65*  CKTOTAL  --   --   --   --   --  441*  --   --    < > = values in this interval not displayed.    Estimated Creatinine Clearance: 49.3 mL/min (A) (by C-G formula based on SCr of 1.65 mg/dL (H)).    Assessment: 54 yoF with disseminated MSSA bacteremia and rapid Afib noted 4/1 - s/p DCCV on 4/4. Held initially post-op due to drops in Hgb and post-op bleeding. The patient is s/p repeat I&D today. Pharmacy consulted to resume Heparin starting at 1800 today.   Goal of Therapy:  Heparin level 0.3-0.7 units/ml Monitor platelets by anticoagulation protocol: Yes   Plan:  - Restart Heparin at 1450  units/hr starting at 1800 this evening - Will continue to monitor for any signs/symptoms of bleeding and will follow up with heparin level in 8 hours after starting  Thank you for allowing pharmacy to be a part of this patient's care.  Alycia Rossetti, PharmD, BCPS Clinical Pharmacist Pager: [phone] Clinical phone for 09/13/2017 from 7a-3:30p: [phone] If after 3:30p, please call main pharmacy at: [phone] 09/22/2017 3:15 PM

## 2017-09-14 NOTE — Progress Notes (Signed)
CRITICAL VALUE ALERT  Critical Value:  hgb-6.5  Date & Time Notied:  09/12/17 @ 5631  Provider Notified: Dr. Jimmey Ralph  Orders Received/Actions taken: 1 unit PRBCs ordered.

## 2017-09-14 NOTE — Progress Notes (Signed)
[facility] Progress Note Patient Name: Jessica Howard DOB: 1960-07-13 MRN: [id number]   Date of Service  09/25/2017  HPI/Events of Note  Anemia - Hgb = 6.8.   eICU Interventions  Will transfuse 1 unit PRBC now.      Intervention Category Major Interventions: Other:  Lysle Dingwall 10/05/2017, 3:44 AM

## 2017-09-14 NOTE — Progress Notes (Addendum)
PULMONARY  / CRITICAL CARE MEDICINE  Name: Jessica Howard MRN: [id number] DOB: [birth year]    LOS: 39  REFERRING MD :  Velvet Bathe MD  CHIEF COMPLAINT:  Back Pain   BRIEF PATIENT DESCRIPTION: Patient is a 57 y.o female with RA on immunosuppressive therapy (Levunomide and Abatacept) and DM who presented to the ED with 2 weeks of progressive myalgias and arthralgias. Initial work-up was significant for AKI, HAGMA, elevated liver enzymes, lactic acidosis, leukocytosis, and acute on chronic anemia. Blood cultures subsequently grew MSSA (4/4). Initial TTE was negative for features of endocarditis; however, patient has pathologic PE findings including splinter hemorrhages and Olser nodes. Her renal function worsened over the course of her hospitalization and she is requiring CRRT. The patient also converted to atrial fibrillation and was placed on amiodarone, diltiazem, and lopressor. MRI of the right foot illustrated septic arthritis of the right first IP with associated osteomyelitis. MRI of the lumbar spine showed septic arthritis of the right L1-2 facet joint with associated fluid collection. The patient went for I&D on 4/4 and we obtained a TEE that could not exclude endocarditis. She was successfully cardioverted at that time.  LINES / TUBES: Right peripheral IV  Right IJ tunneled HD catheter  Urinary catheter [place] rain right hand  Wound VAC left hand   CULTURES: - 4/4 blood cultures on 3/29 for MSSA - Urine cultures on 3/29 growing MSSA - 1/2 Blood cultures on 3/30 with MSSA - Right toe superficial wound culture with staph aureus  - Left wrist synovial fluid with gram positive cocci  - Blood Cultures 4/4 with NGTD  ANTIBIOTICS: - Vancomycin 3/29 -> discontinued on 3/30  - Aztreonam 3/29 -> discontinued on 3/30  - Flagyl 3/29 -> discontinued on 3/30  - Daptomycin 3/30 -> current   INTERVAL HISTORY:  No major events overnight.  Plan to go to OR today.  Remains on CRRT but  nephro will reassess need after surgery.  Hemodynamically stable off pressors.   VITAL SIGNS: Temp:  [97.4 F (36.3 C)-98.3 F (36.8 C)] 97.9 F (36.6 C) (04/07 0615) Pulse Rate:  [54-65] 63 (04/07 0630) Resp:  [17-29] 21 (04/07 0630) BP: (96-145)/(53-80) 126/75 (04/07 0630) SpO2:  [100 %] 100 % (04/07 0630) FiO2 (%):  [40 %-50 %] 40 % (04/07 0600) Weight:  [281 lb 1.4 oz (127.5 kg)] 281 lb 1.4 oz (127.5 kg) (04/07 0415)  INTAKE / OUTPUT: Intake/Output      04/06 0701 - 04/07 0700 04/07 0701 - 04/08 0700   I.V. (mL/kg) 1235.2 (9.7)    Blood 393.8    NG/GT 95    Total Intake(mL/kg) 1723.9 (13.5)    Urine (mL/kg/hr) 125 (0)    Drains 90    Other 2764    Total Output 2979    Net -1255.1           PHYSICAL EXAMINATION:  General: Obese female, sedated  Neuro: Sedated  HEENT: Normocephalic, atraumatic, moist mucus membranes Cardiovascular: RRR, no murmurs, no rubs Lungs: Good air movement with no wheezing or crackles  Abdomen: Active bowel sounds, soft, no tenderness to palpation  Musculoskeletal: Mild LE edema  Skin: Warm and dry   LABS: Cbc Recent Labs  Lab 09/12/17 0435 09/12/17 1039 09/13/17 0439 09/22/2017 0313  WBC 22.5*  --  17.8* 15.2*  HGB 6.5* 7.4* 7.3* 6.8*  HCT 19.6* 22.1* 21.8* 20.7*  PLT 272  --  238 197   Chemistry Recent Labs  Lab 09/09/2017 0409  09/13/17  2725 09/13/17 0439 09/13/17 1633 09/21/2017 0313  NA  --    < > 135  --  134* 133*  K  --    < > 3.6  --  3.9 4.1  CL  --    < > 100*  --  100* 99*  CO2  --    < > 22  --  23 23  BUN  --    < > 31*  --  23* 18  CREATININE  --    < > 2.38*  --  1.89* 1.65*  CALCIUM  --    < > 8.0*  --  7.9* 7.8*  MG 2.6*  --   --  2.5*  --  2.4  PHOS  --    < > 3.4  --  3.3 3.4  3.4  GLUCOSE  --    < > 142*  --  157* 130*   < > = values in this interval not displayed.   Liver fxn Recent Labs  Lab 09/09/17 1004  09/21/2017 0409  09/12/17 0643  09/13/17 0438 09/13/17 1633 09/13/2017 0313  AST 98*  --   67*  --  49*  --   --   --   --   ALT 30  --  28  --  24  --   --   --   --   ALKPHOS 541*  --  527*  --  417*  --   --   --   --   BILITOT 5.4*  --  5.5*  --  3.8*  --   --   --   --   PROT 6.4*  --  6.1*  --  5.8*  --   --   --   --   ALBUMIN 1.5*   < > 1.4*   < > 1.6*  1.6*   < > 1.6* 1.6* 1.6*   < > = values in this interval not displayed.   coags Recent Labs  Lab 09/09/17 1004 09/12/17 0435  INR 1.69 1.52   Sepsis markers Recent Labs  Lab 09/09/17 1004 09/09/17 1241  LATICACIDVEN 1.5 1.2   Cardiac markers Recent Labs  Lab 09/10/17 0434 09/13/17 0439  CKTOTAL 131 441*   BNP No results for input(s): PROBNP in the last 168 hours.   ABG Recent Labs  Lab 09/09/17 1045 09/12/17 1037 09/13/2017 0607  HCO3 22.6 22.6 25.1  TCO2 _0 CBG trend Recent Labs  Lab 09/13/17 1622 09/13/17 2004 10/04/2017 0012 10/04/2017 0316 09/24/2017 0633  GLUCAP 134* 131* 118* 128* 110*   DIAGNOSES: Principal Problem:   MSSA bacteremia Active Problems:   Thalassemia   Rheumatoid arthritis ([place])   Morbid obesity ([place])   Acute back pain   Essential hypertension   Diabetes ([place])   Dyslipidemia   Sepsis ([place])   Acute kidney injury ([place])   Immunosuppression due to drug therapy   Rheumatoid arthritis(714.0)   Septic joint of left wrist ([place])   Osteomyelitis of great toe of right foot ([place])   Hypertension   Hyperlipidemia   Acute hearing loss of left ear   Diabetes mellitus, insulin dependent (IDDM), controlled (H[facility])   Beta thalassemia (H[facility])   Symptomatic anemia   Elevated liver enzymes   Hyperbilirubinemia   Altered mental status, unspecified   Atrial flutter (HCC)   Infection of right hand   Abscess   Acute renal failure (H[facility])  ASSESSMENT / PLAN: 57 y.o  female with RA on immunosuppressive therapy (Levunomide and Abatacept) who presented to the ED on 3/29 with sepsis and multiorgan failure secondary to MSSA Bacteremia/Endocaridits.   INFECTIOUS A: Sepsis  secondary to MSSA Bacteremia/Endocarditis  MSSA septic arthritis, spinal abscess Pathological finding consistent with endocarditis. MRI of the right foot showing findings consistent with septic arthritis of the first IP with associated osteomyelitis.  OR on 4/4 for I&D of the right hand, right foot, and left thumb. TEE cannot exclude endocarditis with thickening of the mitral valve.  Drainage of lumbar abscess 4/5   P:   Infectious disease onboard, continuing daptomycin Orthopedic surgery onboard, plan for OR today  Blood cultures from 4/4 with NGTD  CARDIOVASCULAR A:  Hypotension Patient is hypertensive at baseline on bystolic, irbesartan, and spironolactone.  Atrial Fib 2/2 sepsis  TEE without evidence of large vegetation or thrombus, cardioverted on 4/4.  On amiodarone   P:  Cardiology onboard.  Currently in sinus rhythm. Will restart heparin as soon as bleeding is controlled.   RENAL A: ARF secondary to MSSA bacteremia, blood clots in catheter  Creatine elevated from baseline of 0.9 to 3.36 on admission  UA with hematuria, proteinuria, and bacteruria. Cultures growing Staph aureus.  P:   Patient remains oligouric. Maintain MAP >65 Nephrology onboard. Currently on CRRT, will reassess s/p OR today   PULMONARY A: Post surgical ventilation  Obstructive Sleep Apnea  Vent Mode: PRVC FiO2 (%):  [40 %-50 %] 40 % Set Rate:  [18 bmp] 18 bmp Vt Set:  [470 mL] 470 mL PEEP:  [5 cmH20] 5 cmH20 Pressure Support:  [8 cmH20] 8 cmH20 Plateau Pressure:  [17 cmH20-24 cmH20] 17 cmH20  P:   Current sedation: Fentanyl and propofol  CXR illustrating ET tube in appropriate position. Poor lung volumes with some vascular congestion.  VBG: 7.34/45.5/33.0/25  GASTROINTESTINAL A: Elevated LFTs ? Cholestatic liver injury  Presented with gross elevation in alk phos and direct bilirubin with minor elevation in LFTs and indirect bilirubin.  Abdominal ultrasound unremarkable for acute  etiology with no pericholecystic fluid, wall thickening, gallstones, or ductal dilation.  CT abdomen and pelvis yesterday with normal liver, intra-/extrahepatic biliary duct, and gallbladder.  Ammonium elevated to 44, INR 1.65  P:   Will continue to monitor   HEMATOLOGIC A:  Beta Thalassemia  Acute on chronic anemia secondary to sepsis Hgb dropped from baseline of ~10 to 7.6 on admission.  LDH elevated but haptoglobin elevated and reticulocyte WNL therefore, unlikely to be 2/2 hemolytic anemia.   P:  Hgb down to 6.8 this AM  Will transfuse 1 unit Transfusion goals: Transfuse 1 units pRBCs if <7 or <8 if patient becomes hemodynamically unstable and/or has active bleeding   ENDOCRINE A: Insulin Dependent Type 2 DM On Humolin 75 units TID at home   P:   CBG goal 140-180  Continue SSI-moderate   NEUROLOGICAL A: Altered Mental Status  Multiple contributing factors including A-fib, sepsis, opiates CT head and ammonium WNL  P:  Wean sedation.  Pulmonary and [place] Pager: [phone]  10/07/2017, 7:23 AM

## 2017-09-14 NOTE — Anesthesia Postprocedure Evaluation (Signed)
Anesthesia Post Note  Patient: Jessica Howard  Procedure(s) Performed: IRRIGATION AND DEBRIDEMENT RIGHT GREAT TOE AND BILATERAL WRISTS AMPUTATION TOE (Right Toe)     Patient location during evaluation: PACU Anesthesia Type: General Level of consciousness: sedated Pain management: pain level controlled Vital Signs Assessment: post-procedure vital signs reviewed and stable Respiratory status: spontaneous breathing and respiratory function stable Cardiovascular status: stable Postop Assessment: no apparent nausea or vomiting Anesthetic complications: no    Last Vitals:  Vitals:   10/05/2017 0800 09/12/2017 0900  BP: 136/72 (!) 137/58  Pulse: 66 66  Resp: 17 20  Temp:    SpO2: 100% 100%    Last Pain:  Vitals:   09/13/2017 0743  TempSrc: Oral  PainSc:                  [name]

## 2017-09-14 NOTE — Op Note (Addendum)
09/19/2017  11:52 AM  PATIENT:  Jessica Howard    PRE-OPERATIVE DIAGNOSIS: Right thumb infection, deep, right great toe infection with osteomyelitis, right index fingertip amputation  POST-OPERATIVE DIAGNOSIS:  Same, with right lateral ankle sepsis extending from the tibiotalar joint extending up into the leg in the peroneal tendon sheaths  PROCEDURE: Right great toe amputation at the metatarsophalangeal joints Right thumb irrigation and debridement, skin, subcutaneous tissue, fascia, FPL tenolysis and tenosynovectomy Right index fingertip guillotine amputation at the DIP  SURGEON:  Johnny Bridge, MD  Co-surgeon:   Roseanne Kaufman, MD, present and scrubbed throughout the case critical right for complex surgical soft tissue decision-making and including exposure,   Complex wound management.  PHYSICIAN ASSISTANT: Joya Gaskins, OPA-C, present and scrubbed throughout the case, critical for completion in a timely fashion, and for retraction, instrumentation, and closure.  ANESTHESIA:   General  PREOPERATIVE INDICATIONS:  Jessica Howard is a  56 y.o. female with a diagnosis of right great toe infection osteomyelitis and right index fingertip and thumb infection who elected for surgical management.    The risks benefits and alternatives were discussed with the patient's family preoperatively including but not limited to the risks of infection, bleeding, nerve injury, cardiopulmonary complications, the need for revision surgery, among others, and the patient was willing to proceed.  ESTIMATED BLOOD LOSS: 54ml  OPERATIVE IMPLANTS: none  OPERATIVE FINDINGS: right lateral fibular infection extending into the leg in the peroneal sheath.  Osteomyelitis into the proximal phalanx of the great toe.  Osteomyelitis of the distal phalanx of the index fingertip.  OPERATIVE PROCEDURE: The right lower extremity was prepped and draped in usual sterile fashion.  Timeout performed.  Incision was made  using a fishmouth type incision, and the proximal phalanx were excised.  The toe was removed.  And the infection did not appear to be tracking proximally, after complete debridement I cauterized the vessels, and repaired the skin.  The excision was made with a scalpel, as well as a rongure.  At completion of this the lateral part of the ankle was inspected and appeared that it was forming an abscess formation, incision was made over the distal fibula and there was substantial purulence that was present extending down into the tibiotalar joint and extending up into the peroneal musculature and sheath.  There was substantial pus that was expressed.  This will reflect significant lower extremity sepsis, that is likely to need BKA after discussion with the family.    The right upper extremity was then prepped and draped in the usual sterile fashion.  The index finger demonstrated increased necrosis, and repeat incision and debridement carried out, but the distal phalanx demonstrated osteomylitis, and so guillotine amputation carried out releasing the flexor tendon as proximally as possible.  The digital vessels were cauterized.  The flaps of skin were debrided with a scalpel.   The thumb was then exposed.  The previous incision was opened, and devitalized necrotic tissue excised with a scalpel and a scissor.  The subcutaneous tissue was debrided.  The FPL sheath was inspected, and exposed, tenosynovectomy carried out.  There was no purulence tracking proximally.    The wounds over all index finger and thumb were irrigated with 6 liters of fluid, and then dressed with sterile gauze.    She returned to the ICU in stable condition.

## 2017-09-14 NOTE — Progress Notes (Signed)
SLP Cancellation Note  Patient Details Name: Jessica Howard MRN: [id number] DOB: 1961-04-07   Cancelled treatment:       Reason Eval/Treat Not Completed: Medical issues which prohibited therapy  [name], [place], [place] Speech-Language Pathologist [phone]  [name] 09/22/2017, 8:18 AM

## 2017-09-14 NOTE — Anesthesia Preprocedure Evaluation (Signed)
Anesthesia Evaluation   Patient confused    Reviewed: Allergy & Precautions, H&P , Patient's Chart, lab work & pertinent test results  Airway Mallampati: Intubated   Neck ROM: full    Dental   Pulmonary asthma , sleep apnea , former smoker,    breath sounds clear to auscultation       Cardiovascular hypertension, +CHF   Rhythm:irregular Rate:Tachycardia  Septic Impressions:  - LVEF 60-65%, no LAA thrombus, interatrial septal aneurysm without   PFO, mitral valve thickening - cannot exclude endocarditis, trace   to mild MR, moderate LVH, successful cardioversion with a single   150J biphasic shock to NSR.   Neuro/Psych  Headaches,    GI/Hepatic GERD  ,  Endo/Other  diabetes, Type 2Morbid obesity  Renal/GU ARF and DialysisRenal disease     Musculoskeletal  (+) Arthritis ,   Abdominal   Peds  Hematology  (+) anemia ,   Anesthesia Other Findings   Reproductive/Obstetrics                             Anesthesia Physical  Anesthesia Plan  ASA: IV  Anesthesia Plan: General   Post-op Pain Management:    Induction: Intravenous  PONV Risk Score and Plan: 3 and Ondansetron and Treatment may vary due to age or medical condition  Airway Management Planned: Oral ETT  Additional Equipment:   Intra-op Plan:   Post-operative Plan: Post-operative intubation/ventilation  Informed Consent:   Plan Discussed with: Anesthesiologist  Anesthesia Plan Comments:         Anesthesia Quick Evaluation

## 2017-09-14 NOTE — Transfer of Care (Signed)
Immediate Anesthesia Transfer of Care Note  Patient: Jessica Howard  Procedure(s) Performed: IRRIGATION AND DEBRIDEMENT GREAT TOE POSSIBLE AMPUTATION AND BILATERAL WRISTS (Bilateral )  Patient Location: ICU  Anesthesia Type:General  Level of Consciousness: sedated, unresponsive and Patient remains intubated per anesthesia plan  Airway & Oxygen Therapy: Patient remains intubated per anesthesia plan and Patient placed on Ventilator (see vital sign flow sheet for setting)  Post-op Assessment: Report given to RN and Post -op Vital signs reviewed and stable  Post vital signs: Reviewed and stable  Last Vitals:  Vitals Value Taken Time  BP    Temp    Pulse    Resp 13 09/12/2017 12:11 PM  SpO2    Vitals shown include unvalidated device data.  Last Pain:  Vitals:   09/21/2017 0743  TempSrc: Oral  PainSc:       Patients Stated Pain Goal: 0 (88/41/66 0630)  Complications: No apparent anesthesia complications

## 2017-09-15 ENCOUNTER — Encounter (HOSPITAL_COMMUNITY): Payer: Self-pay | Admitting: Orthopedic Surgery

## 2017-09-15 ENCOUNTER — Inpatient Hospital Stay (HOSPITAL_COMMUNITY): Payer: Medicare Other

## 2017-09-15 DIAGNOSIS — Z978 Presence of other specified devices: Secondary | ICD-10-CM

## 2017-09-15 DIAGNOSIS — G9340 Encephalopathy, unspecified: Secondary | ICD-10-CM

## 2017-09-15 DIAGNOSIS — L02414 Cutaneous abscess of left upper limb: Secondary | ICD-10-CM

## 2017-09-15 DIAGNOSIS — I330 Acute and subacute infective endocarditis: Secondary | ICD-10-CM

## 2017-09-15 DIAGNOSIS — M4656 Other infective spondylopathies, lumbar region: Secondary | ICD-10-CM

## 2017-09-15 DIAGNOSIS — J9600 Acute respiratory failure, unspecified whether with hypoxia or hypercapnia: Secondary | ICD-10-CM

## 2017-09-15 DIAGNOSIS — M00031 Staphylococcal arthritis, right wrist: Secondary | ICD-10-CM

## 2017-09-15 DIAGNOSIS — J90 Pleural effusion, not elsewhere classified: Secondary | ICD-10-CM

## 2017-09-15 DIAGNOSIS — L02413 Cutaneous abscess of right upper limb: Secondary | ICD-10-CM

## 2017-09-15 DIAGNOSIS — M47819 Spondylosis without myelopathy or radiculopathy, site unspecified: Secondary | ICD-10-CM

## 2017-09-15 DIAGNOSIS — I480 Paroxysmal atrial fibrillation: Secondary | ICD-10-CM

## 2017-09-15 LAB — RENAL FUNCTION PANEL
ANION GAP: 14 (ref 5–15)
Albumin: 1.4 g/dL — ABNORMAL LOW (ref 3.5–5.0)
BUN: 29 mg/dL — ABNORMAL HIGH (ref 6–20)
CALCIUM: 7.5 mg/dL — AB (ref 8.9–10.3)
CO2: 18 mmol/L — AB (ref 22–32)
Chloride: 100 mmol/L — ABNORMAL LOW (ref 101–111)
Creatinine, Ser: 2.74 mg/dL — ABNORMAL HIGH (ref 0.44–1.00)
GFR calc Af Amer: 21 mL/min — ABNORMAL LOW (ref >60)
GFR calc non Af Amer: 18 mL/min — ABNORMAL LOW (ref >60)
GLUCOSE: 117 mg/dL — AB (ref 65–99)
PHOSPHORUS: 3.7 mg/dL (ref 2.5–4.6)
POTASSIUM: 4.5 mmol/L (ref 3.5–5.1)
SODIUM: 132 mmol/L — AB (ref 135–145)

## 2017-09-15 LAB — HEPARIN LEVEL (UNFRACTIONATED)
Heparin Unfractionated: <0.1 IU/mL — ABNORMAL LOW (ref 0.30–0.70)
Heparin Unfractionated: <0.1 IU/mL — ABNORMAL LOW (ref 0.30–0.70)
Heparin Unfractionated: 0.24 IU/mL — ABNORMAL LOW (ref 0.30–0.70)

## 2017-09-15 LAB — BASIC METABOLIC PANEL
ANION GAP: 12 (ref 5–15)
BUN: 32 mg/dL — ABNORMAL HIGH (ref 6–20)
CO2: 21 mmol/L — ABNORMAL LOW (ref 22–32)
Calcium: 7.7 mg/dL — ABNORMAL LOW (ref 8.9–10.3)
Chloride: 99 mmol/L — ABNORMAL LOW (ref 101–111)
Creatinine, Ser: 3.14 mg/dL — ABNORMAL HIGH (ref 0.44–1.00)
GFR calc Af Amer: 18 mL/min — ABNORMAL LOW (ref >60)
GFR, EST NON AFRICAN AMERICAN: 16 mL/min — AB (ref >60)
Glucose, Bld: 108 mg/dL — ABNORMAL HIGH (ref 65–99)
POTASSIUM: 4.7 mmol/L (ref 3.5–5.1)
Sodium: 132 mmol/L — ABNORMAL LOW (ref 135–145)

## 2017-09-15 LAB — GLUCOSE, CAPILLARY
GLUCOSE-CAPILLARY: 101 mg/dL — AB (ref 65–99)
GLUCOSE-CAPILLARY: 116 mg/dL — AB (ref 65–99)
GLUCOSE-CAPILLARY: 85 mg/dL (ref 65–99)
Glucose-Capillary: 105 mg/dL — ABNORMAL HIGH (ref 65–99)
Glucose-Capillary: 108 mg/dL — ABNORMAL HIGH (ref 65–99)
Glucose-Capillary: 102 mg/dL — ABNORMAL HIGH (ref 65–99)

## 2017-09-15 LAB — CBC
HCT: 20.7 % — ABNORMAL LOW (ref 36.0–46.0)
Hemoglobin: 7 g/dL — ABNORMAL LOW (ref 12.0–15.0)
MCH: 24.4 pg — ABNORMAL LOW (ref 26.0–34.0)
MCHC: 33.8 g/dL (ref 30.0–36.0)
MCV: 72.1 fL — AB (ref 78.0–100.0)
PLATELETS: 153 10*3/uL (ref 150–400)
RBC: 2.87 MIL/uL — AB (ref 3.87–5.11)
RDW: 26 % — ABNORMAL HIGH (ref 11.5–15.5)
WBC: 16.2 10*3/uL — AB (ref 4.0–10.5)

## 2017-09-15 LAB — HEPATIC FUNCTION PANEL
ALBUMIN: 1.5 g/dL — AB (ref 3.5–5.0)
ALT: 22 U/L (ref 14–54)
AST: 55 U/L — AB (ref 15–41)
Alkaline Phosphatase: 519 U/L — ABNORMAL HIGH (ref 38–126)
Bilirubin, Direct: 2.7 mg/dL — ABNORMAL HIGH (ref 0.1–0.5)
Indirect Bilirubin: 1.4 mg/dL — ABNORMAL HIGH (ref 0.3–0.9)
Total Bilirubin: 4.1 mg/dL — ABNORMAL HIGH (ref 0.3–1.2)
Total Protein: 5.7 g/dL — ABNORMAL LOW (ref 6.5–8.1)

## 2017-09-15 LAB — CK: CK TOTAL: 102 U/L (ref 38–234)

## 2017-09-15 LAB — POCT I-STAT 3, VENOUS BLOOD GAS (G3P V)
Acid-base deficit: 3 mmol/L — ABNORMAL HIGH (ref 0.0–2.0)
BICARBONATE: 22 mmol/L (ref 20.0–28.0)
O2 SAT: 63 %
TCO2: 23 mmol/L (ref 22–32)
pCO2, Ven: 41.5 mmHg — ABNORMAL LOW (ref 44.0–60.0)
pH, Ven: 7.339 (ref 7.250–7.430)
pO2, Ven: 37 mmHg (ref 32.0–45.0)

## 2017-09-15 LAB — TRIGLYCERIDES
TRIGLYCERIDES: 897 mg/dL — AB (ref <150)
TRIGLYCERIDES: 902 mg/dL — AB (ref <150)

## 2017-09-15 LAB — MAGNESIUM: Magnesium: 2.3 mg/dL (ref 1.7–2.4)

## 2017-09-15 MED ORDER — AMIODARONE HCL 200 MG PO TABS
200.0000 mg | ORAL_TABLET | Freq: Two times a day (BID) | ORAL | Status: DC
  Administered 2017-09-15 – 2017-09-19 (×8): 200 mg via ORAL
  Filled 2017-09-15 (×9): qty 1

## 2017-09-15 MED ORDER — SODIUM CHLORIDE 0.9 % IV BOLUS
500.0000 mL | Freq: Once | INTRAVENOUS | Status: AC
  Administered 2017-09-15: 500 mL via INTRAVENOUS

## 2017-09-15 MED ORDER — HEPARIN (PORCINE) IN NACL 100-0.45 UNIT/ML-% IJ SOLN
2150.0000 [IU]/h | INTRAMUSCULAR | Status: AC
  Administered 2017-09-15: 2150 [IU]/h via INTRAVENOUS

## 2017-09-15 MED ORDER — PRO-STAT SUGAR FREE PO LIQD
60.0000 mL | Freq: Three times a day (TID) | ORAL | Status: DC
  Administered 2017-09-15 – 2017-09-19 (×10): 60 mL
  Filled 2017-09-15 (×13): qty 60

## 2017-09-15 MED ORDER — PHENYLEPHRINE HCL 10 MG/ML IJ SOLN
0.0000 ug/min | INTRAMUSCULAR | Status: DC
  Administered 2017-09-16: 20 ug/min via INTRAVENOUS
  Administered 2017-09-16: 250 ug/min via INTRAVENOUS
  Administered 2017-09-16: 125 ug/min via INTRAVENOUS
  Administered 2017-09-16: 150 ug/min via INTRAVENOUS
  Filled 2017-09-15 (×4): qty 10

## 2017-09-15 MED ORDER — LACTATED RINGERS IV SOLN
INTRAVENOUS | Status: DC
  Administered 2017-09-15: 16:00:00 via INTRAVENOUS

## 2017-09-15 MED ORDER — VITAL HIGH PROTEIN PO LIQD
1000.0000 mL | ORAL | Status: DC
  Administered 2017-09-15 – 2017-09-18 (×3): 1000 mL
  Filled 2017-09-15 (×3): qty 1000

## 2017-09-15 NOTE — Progress Notes (Signed)
Progress Note  Patient Name: Jessica Howard Date of Encounter: 09/15/2017  Primary Cardiologist: Minus Breeding, MD   Subjective   57 year old female admitted with confusion, back pain and acute renal failure.  She was found to have MSSA bacteremia.  She has had paroxysmal atrial fibrillation.    She had a TEE cardioversion on April 4 by Dr. Collene Schlichter is now on amiodarone drip.   Changing to PO amio today . Marland Kitchen  Still on the vent   Inpatient Medications    Scheduled Meds: . amiodarone  200 mg Oral BID  . chlorhexidine gluconate (MEDLINE KIT)  15 mL Mouth Rinse BID  . Chlorhexidine Gluconate Cloth  6 each Topical Daily  . docusate  100 mg Per Tube BID  . insulin aspart  0-15 Units Subcutaneous Q4H  . lactulose  10 g Oral Daily  . mouth rinse  15 mL Mouth Rinse 10 times per day  . pantoprazole (PROTONIX) IV  40 mg Intravenous Q24H  . sennosides  5 mL Per Tube Daily  . sodium chloride flush  3 mL Intravenous Q12H   Continuous Infusions: . sodium chloride 10 mL/hr at 09/15/17 0700  . sodium chloride    . DAPTOmycin (CUBICIN)  IV Stopped (09/26/2017 1711)  . fentaNYL infusion INTRAVENOUS    . heparin 1,750 Units/hr (09/15/17 0959)  . propofol (DIPRIVAN) infusion 40 mcg/kg/min (09/15/17 0959)   PRN Meds: acetaminophen (TYLENOL) oral liquid 160 mg/5 mL, acetaminophen **OR** acetaminophen, fentaNYL, LORazepam, ondansetron **OR** ondansetron (ZOFRAN) IV   Vital Signs    Vitals:   09/15/17 0700 09/15/17 0717 09/15/17 0727 09/15/17 0800  BP: (!) 120/55  (!) 120/55 (!) 122/52  Pulse: 75  73 74  Resp: 18  (!) 24 (!) 23  Temp:  99.5 F (37.5 C)    TempSrc:  Oral    SpO2: 100%  100% 100%  Weight:      Height:        Intake/Output Summary (Last 24 hours) at 09/15/2017 1013 Last data filed at 09/15/2017 0900 Gross per 24 hour  Intake 2307.17 ml  Output 380 ml  Net 1927.17 ml   Filed Weights   09/13/17 0500 09/26/2017 0415 09/15/17 0500  Weight: 281 lb 1.4 oz (127.5 kg) 281 lb  1.4 oz (127.5 kg) 286 lb 6 oz (129.9 kg)    Telemetry     NSR - Personally Reviewed  ECG     NSR  - Personally Reviewed  Physical Exam   GEN:  Morbidly obese, middle-aged female.  She is currently on the ventilator and sedated..   Neck: No JVD Cardiac: RRR, no murmurs, rubs, or gallops.  Respiratory:  On the vent. GI: Soft, nontender, non-distended  MS: No edema; No deformity. Neuro:   Unable to assess Psych: Unable to assess  Labs    Chemistry Recent Labs  Lab 09/09/2017 0409  09/12/17 7829  09/13/17 1633 09/24/2017 0313 09/15/17 0408  NA  --    < > 134*   < > 134* 133* 132*  K  --    < > 4.1   < > 3.9 4.1 4.5  CL  --    < > 102   < > 100* 99* 100*  CO2  --    < > 20*   < > 23 23 18*  GLUCOSE  --    < > 175*   < > 157* 130* 117*  BUN  --    < > 40*   < >  23* 18 29*  CREATININE  --    < > 3.02*   < > 1.89* 1.65* 2.74*  CALCIUM  --    < > 7.9*   < > 7.9* 7.8* 7.5*  PROT 6.1*  --  5.8*  --   --   --  5.7*  ALBUMIN 1.4*   < > 1.6*  1.6*   < > 1.6* 1.6* 1.4*  1.5*  AST 67*  --  49*  --   --   --  55*  ALT 28  --  24  --   --   --  22  ALKPHOS 527*  --  417*  --   --   --  519*  BILITOT 5.5*  --  3.8*  --   --   --  4.1*  GFRNONAA  --    < > 16*   < > 29* 34* 18*  GFRAA  --    < > 19*   < > 33* 39* 21*  ANIONGAP  --    < > 12   < > _0 < > = values in this interval not displayed.     Hematology Recent Labs  Lab 09/13/17 0439 10/07/2017 0313 09/15/17 0408  WBC 17.8* 15.2* 16.2*  RBC 3.19* 3.00* 2.87*  HGB 7.3* 6.8* 7.0*  HCT 21.8* 20.7* 20.7*  MCV 68.3* 69.0* 72.1*  MCH 22.9* 22.7* 24.4*  MCHC 33.5 32.9 33.8  RDW 25.7* 25.5* 26.0*  PLT 238 197 153    Cardiac EnzymesNo results for input(s): TROPONINI in the last 168 hours. No results for input(s): TROPIPOC in the last 168 hours.   BNPNo results for input(s): BNP, PROBNP in the last 168 hours.   DDimer No results for input(s): DDIMER in the last 168 hours.   Radiology    Dg Chest Port 1  View  Result Date: 09/15/2017 CLINICAL DATA:  Endotracheally intubated EXAM: PORTABLE CHEST 1 VIEW COMPARISON:  10/02/2017 FINDINGS: Stable cardiomegaly. Stable support tubes and lines. BILATERAL pulmonary opacities, with RIGHT greater than LEFT pleural effusions, representing stable diffuse BILATERAL airspace disease. IMPRESSION: Stable exam. No significant improvement. Differential remains pulmonary edema versus pneumonia. Electronically Signed   By: Staci Righter M.D.   On: 09/15/2017 07:17   Dg Chest Port 1 View  Result Date: 10/02/2017 CLINICAL DATA:  Acute respiratory failure EXAM: PORTABLE CHEST 1 VIEW COMPARISON:  09/13/2017 FINDINGS: Endotracheal tube in good position. Right jugular catheter tip in the right atrium unchanged. NG tube enters the stomach with the tip not visualized. Diffuse bilateral airspace disease with mild improvement. Bibasilar atelectasis and effusion unchanged. No pneumothorax IMPRESSION: Support lines remain in satisfactory position Mild improvement in diffuse bilateral airspace disease. Bibasilar atelectasis and effusion unchanged. Electronically Signed   By: Franchot Gallo M.D.   On: 09/27/2017 08:15   [place] Lt Upper Extrem Ltd Soft Tissue Non Vascular  Result Date: 09/13/2017 CLINICAL DATA:  Severe sepsis and infection of the hand, survey for complex fluid collections along the volar hand and wrist. EXAM: ULTRASOUND left UPPER EXTREMITY LIMITED TECHNIQUE: Ultrasound examination of the upper extremity soft tissues was performed in the area of clinical concern. COMPARISON:  Radiographs from 09/01/2017 FINDINGS: The patient had recent irrigation and debridement of the radiocarpal joint and midcarpal joint demonstrating septic arthritis, and has continued severe swelling and concern for recurrent abscesses. In the volar hand extending from the base of the fingers through the wrist, there is abnormal cutaneous and  subcutaneous edema but without a definite abscess or fluid-filled  pocket identified. IMPRESSION: 1. No drainable abscess is identified by ultrasound survey of the volar left wrist through the fingers. There is notable subcutaneous edema favoring cellulitis. Electronically Signed   By: Van Clines M.D.   On: 09/13/2017 16:39    Cardiac Studies     Patient Profile     57 y.o. female with a history of hypertension, diabetes mellitus, rheumatoid arthritis, obstructive sleep apnea, paroxysmal atrial fibrillation.  She was admitted with sepsis syndrome and MSSA bacteremia.  Assessment & Plan    1.  Paroxysmal atrial fibrillation: The patient had a TEE cardioversion on April 4.  She has maintained normal sinus rhythm since that time.  She is been converted to oral amiodarone.  She has no active cardiac issues.  We will plan on signing off.  She should be discharged on amiodarone 200 mg a day will follow up with Dr. Percival Spanish.  She has normal left ventricular systolic function.  She has moderate pulmonary hypertension.  We will sign off. Call for questions    Signed, Mertie Moores, MD  09/15/2017, 10:13 AM

## 2017-09-15 NOTE — Progress Notes (Addendum)
[facility] for Heparin Indication: atrial fibrillation   Patient Measurements: Height: 5\' 2"  (157.5 cm) Weight: 286 lb 6 oz (129.9 kg) IBW/kg (Calculated) : 50.1 Heparin Dosing Weight: 81 kg  Vital Signs: Temp: 99.9 F (37.7 C) (04/08 1602) Temp Source: Oral (04/08 1602) BP: 101/46 (04/08 2115) Pulse Rate: 82 (04/08 2115)    Assessment: 39 yoF with disseminated MSSA bacteremia and rapid Afib noted 4/1 - s/p DCCV on 4/4. Held initially post-op due to drops in Hgb and post-op bleeding. The patient is s/p repeat I&D on Sunday 10/05/2017. Heparin restarted 09/24/2017 post op.  Heparin rate was increased this morning in response to subtherapeutic heparin level.  Not the 8 hour heparin level has increased to 0.24 on 2050 units/hr, subtherapeutic but moved closer to goal. No bleeding except in wound VAC (not new) per RN. Heparin is to stop tonight at [facility], in preparation for I&D tomorrow.   Goal of Therapy:  Heparin level 0.3-0.7 units/ml Monitor platelets by anticoagulation protocol: Yes    Plan:  Increase heparin to 2150 units/hr Heparin is to stop tonight at [facility], in preparation for I&D tomorrow. Daily heparin level, CBC, S/Sx bleeding while on IV heparin.  Nicole Cella, RPh Clinical Pharmacist Pager: [phone]-4P [phone]-10P [phone] [place] [phone] 09/15/2017  9:31 PM

## 2017-09-15 NOTE — Progress Notes (Signed)
SLP Cancellation Note  Patient Details Name: Jessica Howard MRN: [id number] DOB: 1961/04/16   Cancelled treatment:       Reason Eval/Treat Not Completed: Medical issues which prohibited therapy. Pt remains on the vent with plans to return to OR on next date. SLP to sign off for now. Please reorder when ready.   Germain Osgood 09/15/2017, 8:23 AM  Germain Osgood, M.A. CCC-SLP [phone]

## 2017-09-15 NOTE — Progress Notes (Signed)
[facility] for Heparin Indication: atrial fibrillation   Patient Measurements: Height: 5\' 2"  (157.5 cm) Weight: 281 lb 1.4 oz (127.5 kg) IBW/kg (Calculated) : 50.1 Heparin Dosing Weight: 81 kg  Vital Signs: Temp: 99.8 F (37.7 C) (04/07 2346) Temp Source: Oral (04/07 2346) BP: 102/55 (04/08 0200) Pulse Rate: 78 (04/08 0200)    Assessment: 15 yoF with disseminated MSSA bacteremia and rapid Afib noted 4/1 - s/p DCCV on 4/4. Held initially post-op due to drops in Hgb and post-op bleeding. The patient is s/p repeat I&D today. Heparin restarted this afternoon.  Heparin level is SUBtherapeutic. Discussed with RN, has been infusing okay.   Goal of Therapy:  Heparin level 0.3-0.7 units/ml Monitor platelets by anticoagulation protocol: Yes    Plan:  - Increase heparin to 1750 units/hr - HL, CBC daily - HL in 6 hours   Harvel Quale 09/15/2017 3:18 AM

## 2017-09-15 NOTE — Progress Notes (Signed)
Pt R thumb bleeding MD called, dressing reinforced. RN will continue to monitor.

## 2017-09-15 NOTE — Progress Notes (Signed)
Nutrition Follow-up  DOCUMENTATION CODES:   Morbid obesity  INTERVENTION:   Start trickle TF:  Vital High Protein at 20 ml/h (trickle / goal rate)  Pro-stat 60 ml TID  Provides 1080 kcal (1674 total kcal with Propofol), 132 gm protein, 401 ml free water daily.  NUTRITION DIAGNOSIS:   Inadequate oral intake related to inability to eat as evidenced by NPO status.  Ongoing  GOAL:   Provide needs based on ASPEN/SCCM guidelines  Being addressed with TF  MONITOR:   Vent status, Skin, Labs, I & O's  REASON FOR ASSESSMENT:   Consult Enteral/tube feeding initiation and management  ASSESSMENT:   57 yo female with PMH of HTN, HLD, hepatic steatosis, Rheumatoid arthritis (on immunosuppressive agents), sleep apnea, neuropathy, DM, asthma, thyroid disease, GERD, Thalassemia, and hearing loss who was admitted on 3/29 with MSSA bacteremia, septic arthritis in several joints, AKI due to ATN.  Discussed patient in ICU rounds and with RN today. Received MD Consult for TF initiation and management. Plans to start trickle TF today. NGT in place.  S/P amputation of digits and toes and debridement of wrist on 4/7. VAC in place to L wrist. Plans for R BKA 4/9. CRRT off. Receiving HD per Nephrology as required.   Patient remains intubated on ventilator support Temp (24hrs), Avg:100 F (37.8 C), Min:99 F (37.2 C), Max:101.4 F (38.6 C)  Propofol: 22.5 ml/hr providing 594 kcal per day from lipids   Labs reviewed. Sodium 132 (L) CBG's: [phone] Medications reviewed and include Colace and Lactulose.   Diet Order:  Diet NPO time specified  EDUCATION NEEDS:   No education needs have been identified at this time  Skin:  Skin Assessment: Skin Integrity Issues: Skin Integrity Issues:: Incisions Incisions: L wrist (VAC), R wrist, R foot, L&R arm  Last BM:  3/30  Height:   Ht Readings from Last 1 Encounters:  09/09/2017 5\' 2"  (1.575 m)    Weight:   Wt Readings from Last 1  Encounters:  09/15/17 286 lb 6 oz (129.9 kg)   09/12/17 281 lb 4.9 oz (127.6 kg)   Admission weight  260 lb (117.9 kg)--BMI 47.5  Ideal Body Weight:  50 kg  BMI:  Body mass index is 52.38 kg/m.  Estimated Nutritional Needs:   Kcal:  4585-9292  Protein:  125 gm  Fluid:  >/= 1.5 L    Molli Barrows, RD, LDN, [place] Pager [phone] After Hours Pager [phone]

## 2017-09-15 NOTE — Progress Notes (Signed)
Patient ID: Jessica Howard, female   DOB: 24-Jan-1961, 57 y.o.   MRN: [id number] [facility] Progress Note   Assessment/ Plan:    1. Acute kidney injury: Remains oliguric overnight with 200 cc urine output-suspected ATN in setting of sepsis and hypotension, however pt with evidence of disseminated MSSA septicemia with osler's node and splinter hemorrhages. Started on CVVHD 09/07/17 which appears to have been stopped on 10/07/2017.  At this time, electrolytes and volume status remained permissive to holding renal replacement therapy with reevaluation of labs later this afternoon to decide on need for intervention.  If blood pressures remain stable-may be able to transition to intermittent dialysis rather than CRRT.  2. MSSA septicemia with diffuseseeding of joints/spine, osteo of right great toe. ID following and TEEwith possible MV endocarditis but no vegetations seen.On Daptomycinper ID. Status post extensive debridement of hands yesterday with left wound VAC placed.  Possible left BKA tomorrow. 3. Atrial fibrillation currently sinus and rate controlled on amiodarone drip.  Seen by cardiology now status post DCCV. 4. Abnormal LFT's-possibly shock liver associated with bacteremia/sepsis.  Hypoalbuminemia likely from synthetic defect versus acute phase reactant. 5. Anemia of acute illness- follow and transfuse prn 6. Vascular access- RIJ tdc placed 3/31/19and will need catheter holiday and new HD cath this week and may need cath holiday.  Consider removing today and replacing tomorrow or Wednesday. 7. Rheumatoid arthritis  8. Thalassemia  Subjective:   Without acute events overnight, hemodynamically stable following extensive debridement/irrigation of bilateral upper extremities.  Plans for possible left BKA tomorrow.   Objective:   BP (!) 120/55   Pulse 73   Temp 99.5 F (37.5 C) (Oral)   Resp (!) 24   Ht _0  (1.575 m)   Wt 129.9 kg (286 lb 6 oz)   LMP 12/27/2013  Comment: irreggular  SpO2 100%   BMI 52.38 kg/m   Intake/Output Summary (Last 24 hours) at 09/15/2017 0741 Last data filed at 09/15/2017 0700 Gross per 24 hour  Intake 2266.95 ml  Output 370 ml  Net 1896.95 ml   Weight change: 2.4 kg (5 lb 4.7 oz)  Physical Exam: Gen: Intubated, sedated, appears comfortable CVS: Pulse regular rhythm, normal rate, S1 and S2 normal Resp: Mechanical breath sounds bilaterally, no distinct rales or rhonchi Abd: Soft, obese, nontender Ext: Left arm with dressing/wound VAC, right arm in dressing, left ankle dressing noted.  1-2+ edema over ankles  Imaging: Dg Chest Port 1 View  Result Date: 09/15/2017 CLINICAL DATA:  Endotracheally intubated EXAM: PORTABLE CHEST 1 VIEW COMPARISON:  09/10/2017 FINDINGS: Stable cardiomegaly. Stable support tubes and lines. BILATERAL pulmonary opacities, with RIGHT greater than LEFT pleural effusions, representing stable diffuse BILATERAL airspace disease. IMPRESSION: Stable exam. No significant improvement. Differential remains pulmonary edema versus pneumonia. Electronically Signed   By: Staci Righter M.D.   On: 09/15/2017 07:17   Dg Chest Port 1 View  Result Date: 10/03/2017 CLINICAL DATA:  Acute respiratory failure EXAM: PORTABLE CHEST 1 VIEW COMPARISON:  09/13/2017 FINDINGS: Endotracheal tube in good position. Right jugular catheter tip in the right atrium unchanged. NG tube enters the stomach with the tip not visualized. Diffuse bilateral airspace disease with mild improvement. Bibasilar atelectasis and effusion unchanged. No pneumothorax IMPRESSION: Support lines remain in satisfactory position Mild improvement in diffuse bilateral airspace disease. Bibasilar atelectasis and effusion unchanged. Electronically Signed   By: Franchot Gallo M.D.   On: 09/27/2017 08:15   [place] Lt Upper Extrem Ltd Soft Tissue Non Vascular  Result  Date: 09/13/2017 CLINICAL DATA:  Severe sepsis and infection of the hand, survey for complex fluid  collections along the volar hand and wrist. EXAM: ULTRASOUND left UPPER EXTREMITY LIMITED TECHNIQUE: Ultrasound examination of the upper extremity soft tissues was performed in the area of clinical concern. COMPARISON:  Radiographs from 08/30/2017 FINDINGS: The patient had recent irrigation and debridement of the radiocarpal joint and midcarpal joint demonstrating septic arthritis, and has continued severe swelling and concern for recurrent abscesses. In the volar hand extending from the base of the fingers through the wrist, there is abnormal cutaneous and subcutaneous edema but without a definite abscess or fluid-filled pocket identified. IMPRESSION: 1. No drainable abscess is identified by ultrasound survey of the volar left wrist through the fingers. There is notable subcutaneous edema favoring cellulitis. Electronically Signed   By: Van Clines M.D.   On: 09/13/2017 16:39    Labs: BMET Recent Labs  Lab 09/24/2017 1752 09/12/17 0643 09/12/17 1600 09/13/17 0438 09/13/17 1633 09/10/2017 0313 09/15/17 0408  NA 136 134* 136 135 134* 133* 132*  K 4.3 4.1 3.7 3.6 3.9 4.1 4.5  CL 102 102 103 100* 100* 99* 100*  CO2 22 20* _0 18*  GLUCOSE 214* 175* 159* 142* 157* 130* 117*  BUN 28* 40* 42* 31* 23* 18 29*  CREATININE 2.24* 3.02* 3.12* 2.38* 1.89* 1.65* 2.74*  CALCIUM 8.1* 7.9* 8.1* 8.0* 7.9* 7.8* 7.5*  PHOS 4.2 5.1* 4.3 3.4 3.3 3.4  3.4 3.7   CBC Recent Labs  Lab 09/08/17 1325  09/12/17 0435 09/12/17 1039 09/13/17 0439 10/04/2017 0313 09/15/17 0408  WBC 24.1*   < > 22.5*  --  17.8* 15.2* 16.2*  NEUTROABS 21.7*  --   --   --   --   --   --   HGB 8.1*   < > 6.5* 7.4* 7.3* 6.8* 7.0*  HCT 25.7*   < > 19.6* 22.1* 21.8* 20.7* 20.7*  MCV 66.6*   < > 65.1*  --  68.3* 69.0* 72.1*  PLT 344   < > 272  --  238 197 153   < > = values in this interval not displayed.    Medications:    . chlorhexidine gluconate (MEDLINE KIT)  15 mL Mouth Rinse BID  . Chlorhexidine Gluconate Cloth   6 each Topical Daily  . docusate  100 mg Per Tube BID  . insulin aspart  0-15 Units Subcutaneous Q4H  . lactulose  10 g Oral Daily  . mouth rinse  15 mL Mouth Rinse 10 times per day  . pantoprazole (PROTONIX) IV  40 mg Intravenous Q24H  . sennosides  5 mL Per Tube Daily  . sodium chloride flush  3 mL Intravenous Q12H   Elmarie Shiley, MD 09/15/2017, 7:41 AM

## 2017-09-15 NOTE — Progress Notes (Signed)
PULMONARY  / CRITICAL CARE MEDICINE  Name: Jessica Howard MRN: [id number] DOB: [birth year]    LOS: 26  REFERRING MD :  Velvet Bathe MD  CHIEF COMPLAINT:  Back Pain   BRIEF PATIENT DESCRIPTION: Patient is a 57 y.o female with RA on immunosuppressive therapy (Levunomide and Abatacept) and DM who presented to the ED with 2 weeks of progressive myalgias and arthralgias. Initial work-up was significant for AKI, HAGMA, elevated liver enzymes, lactic acidosis, leukocytosis, and acute on chronic anemia. Blood cultures subsequently grew MSSA (4/4). Initial TTE was negative for features of endocarditis; however, patient has pathologic PE findings including splinter hemorrhages and Olser nodes. Her renal function worsened over the course of her hospitalization and she is requiring CRRT. The patient also converted to atrial fibrillation and was placed on amiodarone, diltiazem, and lopressor. MRI of the right foot illustrated septic arthritis of the right first IP with associated osteomyelitis. MRI of the lumbar spine showed septic arthritis of the right L1-2 facet joint with associated fluid collection. The patient went for I&D on 4/4 and we obtained a TEE that could not exclude endocarditis. She was successfully cardioverted at that time.  LINES / TUBES: Right peripheral IV  Right IJ tunneled HD catheter  Urinary catheter [place] rain right hand  Wound VAC left hand  Endotracheal tube  NG tube   CULTURES: - 4/4 blood cultures on 3/29 for MSSA - Urine cultures on 3/29 growing MSSA - 1/2 Blood cultures on 3/30 with MSSA - Right toe superficial wound culture with staph aureus  - Left wrist synovial fluid with gram positive cocci  - Blood Cultures 4/4 with NGTD  ANTIBIOTICS: - Vancomycin 3/29 -> discontinued on 3/30  - Aztreonam 3/29 -> discontinued on 3/30  - Flagyl 3/29 -> discontinued on 3/30  - Daptomycin 3/30 -> current   INTERVAL HISTORY:  Febrile overnight.  Restarted heparin after OR  yesterday.  Plan to return to OR on 4/9.  VITAL SIGNS: Temp:  [97.9 F (36.6 C)-101.4 F (38.6 C)] 100.9 F (38.3 C) (04/08 0430) Pulse Rate:  [63-84] 73 (04/08 0500) Resp:  [17-31] 23 (04/08 0500) BP: (96-152)/(45-116) 98/46 (04/08 0500) SpO2:  [98 %-100 %] 98 % (04/08 0500) FiO2 (%):  [40 %] 40 % (04/08 0400) Weight:  [286 lb 6 oz (129.9 kg)] 286 lb 6 oz (129.9 kg) (04/08 0500)  INTAKE / OUTPUT: Intake/Output      04/07 0701 - 04/08 0700   I.V. (mL/kg) 2069.6 (15.9)   NG/GT 30   Total Intake(mL/kg) 2099.6 (16.2)   Urine (mL/kg/hr) 210 (0.1)   Blood 50   Total Output 260   Net +1839.6         PHYSICAL EXAMINATION:  General: Obese female, sedated  Neuro: Sedated  HEENT: Normocephalic, atraumatic, moist mucus membranes Cardiovascular: RRR, no murmurs, no rubs Lungs: Good air movement with no wheezing or crackles  Abdomen: Active bowel sounds, soft, no tenderness to palpation  Musculoskeletal: Mild LE edema  Skin: Warm and dry   LABS: Cbc Recent Labs  Lab 09/13/17 0439 09/21/2017 0313 09/15/17 0408  WBC 17.8* 15.2* 16.2*  HGB 7.3* 6.8* 7.0*  HCT 21.8* 20.7* 20.7*  PLT 238 197 153   Chemistry Recent Labs  Lab 09/13/17 0438 09/13/17 0439 09/13/17 1633 09/19/2017 0313 09/15/17 0408  NA 135  --  134* 133*  --   K 3.6  --  3.9 4.1  --   CL 100*  --  100* 99*  --  CO2 22  --  23 23  --   BUN 31*  --  23* 18  --   CREATININE 2.38*  --  1.89* 1.65*  --   CALCIUM 8.0*  --  7.9* 7.8*  --   MG  --  2.5*  --  2.4 2.3  PHOS 3.4  --  3.3 3.4  3.4  --   GLUCOSE 142*  --  157* 130*  --    Liver fxn Recent Labs  Lab 09/09/2017 0409  09/12/17 0643  09/13/17 1633 10/02/2017 0313 09/15/17 0408  AST 67*  --  49*  --   --   --  55*  ALT 28  --  24  --   --   --  22  ALKPHOS 527*  --  417*  --   --   --  519*  BILITOT 5.5*  --  3.8*  --   --   --  4.1*  PROT 6.1*  --  5.8*  --   --   --  PENDING  ALBUMIN 1.4*   < > 1.6*  1.6*   < > 1.6* 1.6* 1.5*   < > = values in  this interval not displayed.   coags Recent Labs  Lab 09/09/17 1004 09/12/17 0435  INR 1.69 1.52   Sepsis markers Recent Labs  Lab 09/09/17 1004 09/09/17 1241  LATICACIDVEN 1.5 1.2   Cardiac markers Recent Labs  Lab 09/10/17 0434 09/13/17 0439 09/15/17 0408  CKTOTAL 131 441* 102   BNP No results for input(s): PROBNP in the last 168 hours.   ABG Recent Labs  Lab 09/09/17 1045 09/12/17 1037 09/17/2017 0607  HCO3 22.6 22.6 25.1  TCO2 24 24 27    CBG trend Recent Labs  Lab 09/25/2017 0731 10/07/2017 1617 09/08/2017 2000 09/29/2017 2343 09/15/17 0428  GLUCAP 121* 129* 118* 129* 105*   DIAGNOSES: Principal Problem:   MSSA bacteremia Active Problems:   Thalassemia   Rheumatoid arthritis ([place])   Morbid obesity (HCC)   Acute back pain   Essential hypertension   Diabetes ([place])   Dyslipidemia   Sepsis ([place])   Acute kidney injury ([place])   Immunosuppression due to drug therapy   Rheumatoid arthritis(714.0)   Septic joint of left wrist ([place])   Osteomyelitis of great toe of right foot ([place])   Hypertension   Hyperlipidemia   Acute hearing loss of left ear   Diabetes mellitus, insulin dependent (IDDM), controlled (HCC)   Beta thalassemia (HCC)   Symptomatic anemia   Elevated liver enzymes   Hyperbilirubinemia   Altered mental status, unspecified   Atrial flutter (HCC)   Infection of right hand   Abscess   Acute renal failure (HCC)  ASSESSMENT / PLAN: 57 y.o female with RA on immunosuppressive therapy (Levunomide and Abatacept) who presented to the ED on 3/29 with sepsis and multiorgan failure secondary to MSSA Bacteremia/Endocaridits.   INFECTIOUS A: Sepsis secondary to MSSA Bacteremia/Endocarditis  MSSA septic arthritis, spinal abscess Pathological finding consistent with endocarditis. MRI of the right foot showing findings consistent with septic arthritis of the first IP with associated osteomyelitis.  OR on 4/4 for I&D of the right hand, right foot, and left  thumb. TEE cannot exclude endocarditis with thickening of the mitral valve.  Drainage of lumbar abscess 4/5  S/p right 1 ray amputation and right 2nd digit DIP amputation. I&D of the right wrist, and left wrist.   P:   Infectious disease onboard, continuing daptomycin Orthopedic  surgery onboard. Blood cultures from 4/4 with NGTD I suspect fevers are from inflammation of surgery as leukocytosis is decreasing; however, given progression of CXR and that daptomycin does not provide lung coverage we will monitor closely to determine if we need to add VAP coverage.   CARDIOVASCULAR A:  Hypotension Patient is hypertensive at baseline on bystolic, irbesartan, and spironolactone.  Atrial Fib 2/2 sepsis  TEE without evidence of large vegetation or thrombus, cardioverted on 4/4.  On amiodarone   P:  Cardiology onboard.  Currently in sinus rhythm. Restarted heparin on 4/7   RENAL A: ARF secondary to MSSA bacteremia, blood clots in catheter  Creatine elevated from baseline of 0.9 to 3.36 on admission  UA with hematuria, proteinuria, and bacteruria. Cultures growing Staph aureus.  P:   Patient remains oligouric. Maintain MAP >65 Nephrology onboard. Possibly will start intermittent HD today.  Will need HD line out at some point for line holiday.   PULMONARY A: Post surgical ventilation  Obstructive Sleep Apnea  Vent Mode: PRVC FiO2 (%):  [40 %] 40 % Set Rate:  [18 bmp] 18 bmp Vt Set:  [470 mL] 470 mL PEEP:  [5 cmH20] 5 cmH20 Plateau Pressure:  [21 cmH20-25 cmH20] 22 cmH20  P:   Current sedation: Fentanyl and propofol  VAP precautions CXR illustrating showing bilateral opacities vs atelectasis vs pulmonary edema  VBG: 7.33/41.37/22  GASTROINTESTINAL A: Elevated LFTs ? Cholestatic liver injury  Presented with gross elevation in alk phos and direct bilirubin with minor elevation in LFTs and indirect bilirubin.  Abdominal ultrasound unremarkable for acute etiology with no  pericholecystic fluid, wall thickening, gallstones, or ductal dilation.  CT abdomen and pelvis yesterday with normal liver, intra-/extrahepatic biliary duct, and gallbladder.  Ammonium elevated to 44, INR 1.65  P:   Remain elevated.  Will continue to monitor.   HEMATOLOGIC A:  Beta Thalassemia  Acute on chronic anemia secondary to sepsis Hgb dropped from baseline of ~10 to 7.6 on admission.  LDH elevated but haptoglobin elevated and reticulocyte WNL therefore, unlikely to be 2/2 hemolytic anemia.   P:  Hgb 7.0 this AM Transfusion goals: Transfuse 1 units pRBCs if <7 or <8 if patient becomes hemodynamically unstable and/or has active bleeding   ENDOCRINE A: Insulin Dependent Type 2 DM On Humolin 75 units TID at home   P:   CBG goal 140-180  Continue SSI-moderate   NEUROLOGICAL A: Altered Mental Status  Multiple contributing factors including A-fib, sepsis, opiates CT head and ammonium WNL  P:  Wean sedation.  Pulmonary and [place] Pager: [phone]  09/15/2017, 5:48 AM

## 2017-09-15 NOTE — Progress Notes (Signed)
Patient ID: Jessica Howard, female   DOB: May 26, 1961, 57 y.o.   MRN: [id number]     Subjective:  Patient intubated but is awake and opens eyes doen not follow commands.  Objective:   VITALS:   Vitals:   09/15/17 1200 09/15/17 1214 09/15/17 1300 09/15/17 1400  BP:   (!) 103/51 (!) 94/44  Pulse: 71  72 71  Resp: (!) 27  19 19   Temp:  99.6 F (37.6 C)    TempSrc:  Oral    SpO2: 100%  100% 100%  Weight:      Height:        ABD soft Dressings wit moderate drainage Wound vac in place and functioning   Lab Results  Component Value Date   WBC 16.2 (H) 09/15/2017   HGB 7.0 (L) 09/15/2017   HCT 20.7 (L) 09/15/2017   MCV 72.1 (L) 09/15/2017   PLT 153 09/15/2017   BMET    Component Value Date/Time   NA 132 (L) 09/15/2017 0408   NA 142 03/06/2017 0929   K 4.5 09/15/2017 0408   K 4.1 03/06/2017 0929   CL 100 (L) 09/15/2017 0408   CL 101 02/13/2012 0850   CO2 18 (L) 09/15/2017 0408   CO2 23 03/06/2017 0929   GLUCOSE 117 (H) 09/15/2017 0408   GLUCOSE 129 03/06/2017 0929   GLUCOSE 529 (H) 02/13/2012 0850   BUN 29 (H) 09/15/2017 0408   BUN 17.1 03/06/2017 0929   CREATININE 2.74 (H) 09/15/2017 0408   CREATININE 0.8 03/06/2017 0929   CALCIUM 7.5 (L) 09/15/2017 0408   CALCIUM 9.6 03/06/2017 0929   GFRNONAA 18 (L) 09/15/2017 0408   GFRAA 21 (L) 09/15/2017 0408     Assessment/Plan: 1 Day Post-Op   Principal Problem:   MSSA bacteremia Active Problems:   Thalassemia   Rheumatoid arthritis ([place])   Morbid obesity (HCC)   Acute back pain   Essential hypertension   Diabetes ([place])   Dyslipidemia   Severe sepsis with septic shock (HCC)   Acute kidney injury ([place])   Immunosuppression due to drug therapy   Rheumatoid arthritis(714.0)   Septic joint of left wrist (HCC)   Osteomyelitis of great toe of right foot ([place])   Hypertension   Hyperlipidemia   Acute hearing loss of left ear   Diabetes mellitus, insulin dependent (IDDM), controlled (HCC)   Beta thalassemia  (HCC)   Symptomatic anemia   Elevated liver enzymes   Hyperbilirubinemia   Altered mental status, unspecified   Atrial flutter ([place])   Infection of right hand   Abscess   Acute renal failure (HCC)   Acute respiratory failure (HCC)   Encephalopathy   Endotracheally intubated   Staphylococcal arthritis of right wrist (HCC)   Facet joint disease   Pleural effusion   Continue plan per medicine Plan for return to OR tomorrow for repeat I&D of the bilateral hands and BKA on the right lower ext.      [name] 09/15/2017, 2:51 PM   [name], MD Cell [phone]

## 2017-09-15 NOTE — Progress Notes (Addendum)
Subjective:  Intubated and sedated  Antibiotics:  Anti-infectives (From admission, onward)   Start     Dose/Rate Route Frequency Ordered Stop   09/08/17 1600  DAPTOmycin (CUBICIN) 1,000 mg in sodium chloride 0.9 % IVPB     1,000 mg 240 mL/hr over 30 Minutes Intravenous Every 48 hours 09/07/17 1019     09/07/17 1000  vancomycin (VANCOCIN) 1,500 mg in sodium chloride 0.9 % 500 mL IVPB  Status:  Discontinued     1,500 mg 250 mL/hr over 120 Minutes Intravenous Every 48 hours 09/02/2017 0906 09/06/17 0935   09/06/17 1600  DAPTOmycin (CUBICIN) 984 mg in sodium chloride 0.9 % IVPB  Status:  Discontinued     8 mg/kg  123 kg 239.4 mL/hr over 30 Minutes Intravenous Every 48 hours 09/06/17 1436 09/07/17 1019   09/06/17 1000  aztreonam (AZACTAM) 0.5 g in dextrose 5 % 50 mL IVPB  Status:  Discontinued     0.5 g 100 mL/hr over 30 Minutes Intravenous Every 8 hours 09/06/17 0943 09/06/17 1223   09/07/2017 2000  metroNIDAZOLE (FLAGYL) IVPB 500 mg  Status:  Discontinued     500 mg 100 mL/hr over 60 Minutes Intravenous Every 8 hours 08/18/2017 1343 09/06/17 0028   09/03/2017 0900  aztreonam (AZACTAM) 2 g in sodium chloride 0.9 % 100 mL IVPB  Status:  Discontinued     2 g 200 mL/hr over 30 Minutes Intravenous Every 8 hours 09/07/2017 0853 09/06/17 0028   09/01/2017 0900  metroNIDAZOLE (FLAGYL) IVPB 500 mg     500 mg 100 mL/hr over 60 Minutes Intravenous  Once 08/15/2017 0853 08/19/2017 1035   08/30/2017 0830  piperacillin-tazobactam (ZOSYN) IVPB 3.375 g  Status:  Discontinued     3.375 g 100 mL/hr over 30 Minutes Intravenous  Once 09/07/2017 0816 09/07/2017 0836   08/16/2017 0830  vancomycin (VANCOCIN) IVPB 1000 mg/200 mL premix  Status:  Discontinued     1,000 mg 200 mL/hr over 60 Minutes Intravenous  Once 08/19/2017 0816 08/08/2017 0822   08/10/2017 0830  vancomycin (VANCOCIN) 2,000 mg in sodium chloride 0.9 % 500 mL IVPB     2,000 mg 250 mL/hr over 120 Minutes Intravenous  Once 08/24/2017 0822 08/25/2017 1342       Medications: Scheduled Meds: . amiodarone  200 mg Oral BID  . chlorhexidine gluconate (MEDLINE KIT)  15 mL Mouth Rinse BID  . Chlorhexidine Gluconate Cloth  6 each Topical Daily  . docusate  100 mg Per Tube BID  . insulin aspart  0-15 Units Subcutaneous Q4H  . lactulose  10 g Oral Daily  . mouth rinse  15 mL Mouth Rinse 10 times per day  . pantoprazole (PROTONIX) IV  40 mg Intravenous Q24H  . sennosides  5 mL Per Tube Daily  . sodium chloride flush  3 mL Intravenous Q12H   Continuous Infusions: . sodium chloride 10 mL/hr at 09/15/17 0700  . sodium chloride    . DAPTOmycin (CUBICIN)  IV Stopped (09/21/2017 1711)  . fentaNYL infusion INTRAVENOUS    . heparin 1,750 Units/hr (09/15/17 0959)  . propofol (DIPRIVAN) infusion 40 mcg/kg/min (09/15/17 0959)   PRN Meds:.acetaminophen (TYLENOL) oral liquid 160 mg/5 mL, acetaminophen **OR** acetaminophen, fentaNYL, LORazepam, ondansetron **OR** ondansetron (ZOFRAN) IV    Objective: Weight change: 5 lb 4.7 oz (2.4 kg)  Intake/Output Summary (Last 24 hours) at 09/15/2017 1059 Last data filed at 09/15/2017 0900 Gross per 24 hour  Intake 1807.17 ml  Output 330  ml  Net 1477.17 ml   Blood pressure (!) 122/52, pulse 74, temperature 99.5 F (37.5 C), temperature source Oral, resp. rate (!) 23, height '5\' 2"'$  (1.575 m), weight 286 lb 6 oz (129.9 kg), last menstrual period 12/27/2013, SpO2 100 %. Temp:  [99 F (37.2 C)-101.4 F (38.6 C)] 99.5 F (37.5 C) (04/08 0717) Pulse Rate:  [64-84] 74 (04/08 0800) Resp:  [18-31] 23 (04/08 0800) BP: (94-152)/(45-116) 122/52 (04/08 0800) SpO2:  [98 %-100 %] 100 % (04/08 0800) FiO2 (%):  [40 %] 40 % (04/08 0800) Weight:  [286 lb 6 oz (129.9 kg)] 286 lb 6 oz (129.9 kg) (04/08 0500)  Physical Exam: General: Intubated  HEENT: anicteric sclera,EOMI CVS regular rate, normal r,  no murmur rubs or gallops Chest: clear to auscultation anteriorly  abdomen: soft nontender, nondistended, normal bowel  sounds, Extremities/skin:  She has a vacuum dressing in her left wrist and her right wrist is wrapped extensively right lower extremity also dressed   Neuro: nonfocal  CBC:    BMET Recent Labs    09/17/2017 0313 09/15/17 0408  NA 133* 132*  K 4.1 4.5  CL 99* 100*  CO2 23 18*  GLUCOSE 130* 117*  BUN 18 29*  CREATININE 1.65* 2.74*  CALCIUM 7.8* 7.5*     Liver Panel  Recent Labs    09/08/2017 0313 09/15/17 0408  PROT  --  5.7*  ALBUMIN 1.6* 1.4*  1.5*  AST  --  55*  ALT  --  22  ALKPHOS  --  519*  BILITOT  --  4.1*  BILIDIR  --  2.7*  IBILI  --  1.4*       Sedimentation Rate No results for input(s): ESRSEDRATE in the last 72 hours. C-Reactive Protein No results for input(s): CRP in the last 72 hours.  Micro Results: Recent Results (from the past 720 hour(s))  Blood Culture (routine x 2)     Status: Abnormal   Collection Time: 08/21/2017  9:50 AM  Result Value Ref Range Status   Specimen Description BLOOD RIGHT HAND  Final   Special Requests   Final    BOTTLES DRAWN AEROBIC AND ANAEROBIC Blood Culture adequate volume   Culture  Setup Time   Final    GRAM POSITIVE COCCI IN BOTH AEROBIC AND ANAEROBIC BOTTLES CRITICAL RESULT CALLED TO, READ BACK BY AND VERIFIED WITH: G ABBOTT 09/06/17 0006 JDW    Culture (A)  Final    STAPHYLOCOCCUS AUREUS SUSCEPTIBILITIES PERFORMED ON PREVIOUS CULTURE WITHIN THE LAST 5 DAYS. Performed at [facility], [place] [address], [place], [place] [address]    Report Status 09/08/2017 FINAL  Final  Blood Culture (routine x 2)     Status: Abnormal   Collection Time: 08/31/2017  1:54 PM  Result Value Ref Range Status   Specimen Description BLOOD RIGHT FOREARM  Final   Special Requests   Final    BOTTLES DRAWN AEROBIC AND ANAEROBIC Blood Culture adequate volume   Culture  Setup Time   Final    GRAM POSITIVE COCCI IN BOTH AEROBIC AND ANAEROBIC BOTTLES CRITICAL RESULT CALLED TO, READ BACK BY AND VERIFIED WITH: Denton Brick PHARMD  09/06/17 0006 JDW Performed at [facility], [place] [address] [place], [place] [address]    Culture STAPHYLOCOCCUS AUREUS (A)  Final   Report Status 09/08/2017 FINAL  Final   Organism ID, Bacteria STAPHYLOCOCCUS AUREUS  Final      Susceptibility   Staphylococcus aureus - MIC*  CIPROFLOXACIN <=0.5 SENSITIVE Sensitive     ERYTHROMYCIN <=0.25 SENSITIVE Sensitive     GENTAMICIN <=0.5 SENSITIVE Sensitive     OXACILLIN 0.5 SENSITIVE Sensitive     TETRACYCLINE <=1 SENSITIVE Sensitive     VANCOMYCIN 1 SENSITIVE Sensitive     TRIMETH/SULFA <=10 SENSITIVE Sensitive     CLINDAMYCIN <=0.25 SENSITIVE Sensitive     RIFAMPIN <=0.5 SENSITIVE Sensitive     Inducible Clindamycin NEGATIVE Sensitive     * STAPHYLOCOCCUS AUREUS  Blood Culture ID Panel (Reflexed)     Status: Abnormal   Collection Time: 09/06/2017  1:54 PM  Result Value Ref Range Status   Enterococcus species NOT DETECTED NOT DETECTED Final   Listeria monocytogenes NOT DETECTED NOT DETECTED Final   Staphylococcus species DETECTED (A) NOT DETECTED Final    Comment: CRITICAL RESULT CALLED TO, READ BACK BY AND VERIFIED WITH: G ABBOTT PHARMD 09/06/17 0006 JDW    Staphylococcus aureus DETECTED (A) NOT DETECTED Final    Comment: Methicillin (oxacillin) susceptible Staphylococcus aureus (MSSA). Preferred therapy is anti staphylococcal beta lactam antibiotic (Cefazolin or Nafcillin), unless clinically contraindicated. CRITICAL RESULT CALLED TO, READ BACK BY AND VERIFIED WITH: G ABBOTT PHARMD 09/06/17 0006 JDW    Methicillin resistance NOT DETECTED NOT DETECTED Final   Streptococcus species NOT DETECTED NOT DETECTED Final   Streptococcus agalactiae NOT DETECTED NOT DETECTED Final   Streptococcus pneumoniae NOT DETECTED NOT DETECTED Final   Streptococcus pyogenes NOT DETECTED NOT DETECTED Final   Acinetobacter baumannii NOT DETECTED NOT DETECTED Final   Enterobacteriaceae species NOT DETECTED NOT DETECTED Final   Enterobacter cloacae  complex NOT DETECTED NOT DETECTED Final   Escherichia coli NOT DETECTED NOT DETECTED Final   Klebsiella oxytoca NOT DETECTED NOT DETECTED Final   Klebsiella pneumoniae NOT DETECTED NOT DETECTED Final   Proteus species NOT DETECTED NOT DETECTED Final   Serratia marcescens NOT DETECTED NOT DETECTED Final   Haemophilus influenzae NOT DETECTED NOT DETECTED Final   Neisseria meningitidis NOT DETECTED NOT DETECTED Final   Pseudomonas aeruginosa NOT DETECTED NOT DETECTED Final   Candida albicans NOT DETECTED NOT DETECTED Final   Candida glabrata NOT DETECTED NOT DETECTED Final   Candida krusei NOT DETECTED NOT DETECTED Final   Candida parapsilosis NOT DETECTED NOT DETECTED Final   Candida tropicalis NOT DETECTED NOT DETECTED Final    Comment: Performed at [facility], [place] [address] [place], [place] [address]  Urine culture     Status: Abnormal   Collection Time: 08/24/2017  3:56 PM  Result Value Ref Range Status   Specimen Description URINE, RANDOM  Final   Special Requests   Final    NONE Performed at [facility], [place] [address] [place], [place] [address]    Culture 50,000 COLONIES/mL STAPHYLOCOCCUS AUREUS (A)  Final   Report Status 09/07/2017 FINAL  Final   Organism ID, Bacteria STAPHYLOCOCCUS AUREUS (A)  Final      Susceptibility   Staphylococcus aureus - MIC*    CIPROFLOXACIN <=0.5 SENSITIVE Sensitive     GENTAMICIN <=0.5 SENSITIVE Sensitive     NITROFURANTOIN 32 SENSITIVE Sensitive     OXACILLIN 0.5 SENSITIVE Sensitive     TETRACYCLINE <=1 SENSITIVE Sensitive     VANCOMYCIN 1 SENSITIVE Sensitive     TRIMETH/SULFA <=10 SENSITIVE Sensitive     CLINDAMYCIN <=0.25 SENSITIVE Sensitive     RIFAMPIN <=0.5 SENSITIVE Sensitive     Inducible Clindamycin NEGATIVE Sensitive     * 50,000 COLONIES/mL STAPHYLOCOCCUS AUREUS  MRSA PCR Screening     Status: None   Collection Time: 09/06/17  5:20 AM  Result Value Ref Range Status   MRSA by PCR NEGATIVE NEGATIVE Final    Comment:         The GeneXpert MRSA Assay (FDA approved for NASAL specimens only), is one component of a comprehensive MRSA colonization surveillance program. It is not intended to diagnose MRSA infection nor to guide or monitor treatment for MRSA infections. Performed at [facility], [place] [address] [place], [place] [address]   Culture, blood (routine x 2)     Status: Abnormal   Collection Time: 09/07/17  7:09 AM  Result Value Ref Range Status   Specimen Description BLOOD RIGHT HAND  Final   Special Requests AEROBIC BOTTLE ONLY Blood Culture adequate volume  Final   Culture  Setup Time   Final    GRAM POSITIVE COCCI AEROBIC BOTTLE ONLY CRITICAL VALUE NOTED.  VALUE IS CONSISTENT WITH PREVIOUSLY REPORTED AND CALLED VALUE.    Culture (A)  Final    STAPHYLOCOCCUS AUREUS SUSCEPTIBILITIES PERFORMED ON PREVIOUS CULTURE WITHIN THE LAST 5 DAYS. Performed at [facility], [place] [address], [place], [place] [address]    Report Status 09/09/2017 FINAL  Final  Culture, blood (routine x 2)     Status: None   Collection Time: 09/07/17  7:17 AM  Result Value Ref Range Status   Specimen Description BLOOD LEFT ANTECUBITAL  Final   Special Requests   Final    IN BOTH AEROBIC AND ANAEROBIC BOTTLES Blood Culture results may not be optimal due to an excessive volume of blood received in culture bottles   Culture   Final    NO GROWTH 5 DAYS Performed at [facility], [place] [address] [place],  [address]    Report Status 09/12/2017 FINAL  Final  Aerobic Culture (superficial specimen)     Status: None   Collection Time: 09/08/17  3:10 PM  Result Value Ref Range Status   Specimen Description WOUND RIGHT TOE  Final   Special Requests   Final    NONE Performed at [facility], [place] [address] [place], [place] [address]    Gram Stain   Final    RARE WBC PRESENT, PREDOMINANTLY MONONUCLEAR FEW GRAM POSITIVE COCCI ABUNDANT SQUAMOUS EPITHELIAL CELLS PRESENT    Culture ABUNDANT  STAPHYLOCOCCUS AUREUS  Final   Report Status 09/10/2017 FINAL  Final   Organism ID, Bacteria STAPHYLOCOCCUS AUREUS  Final      Susceptibility   Staphylococcus aureus - MIC*    CIPROFLOXACIN <=0.5 SENSITIVE Sensitive     ERYTHROMYCIN <=0.25 SENSITIVE Sensitive     GENTAMICIN <=0.5 SENSITIVE Sensitive     OXACILLIN 0.5 SENSITIVE Sensitive     TETRACYCLINE <=1 SENSITIVE Sensitive     VANCOMYCIN <=0.5 SENSITIVE Sensitive     TRIMETH/SULFA <=10 SENSITIVE Sensitive     CLINDAMYCIN <=0.25 SENSITIVE Sensitive     RIFAMPIN <=0.5 SENSITIVE Sensitive     Inducible Clindamycin NEGATIVE Sensitive     * ABUNDANT STAPHYLOCOCCUS AUREUS  Body fluid culture     Status: None   Collection Time: 09/09/17  3:00 PM  Result Value Ref Range Status   Specimen Description SYNOVIAL LEFT WRIST  Final   Special Requests NONE  Final   Gram Stain   Final    ABUNDANT WBC PRESENT, PREDOMINANTLY PMN MODERATE GRAM POSITIVE COCCI Performed at [facility], [place],  [place] [address]    Culture FEW STAPHYLOCOCCUS AUREUS  Final   Report Status 09/12/2017 FINAL  Final   Organism ID, Bacteria STAPHYLOCOCCUS AUREUS  Final      Susceptibility   Staphylococcus aureus - MIC*    CIPROFLOXACIN <=0.5 SENSITIVE Sensitive     ERYTHROMYCIN <=0.25 SENSITIVE Sensitive     GENTAMICIN <=0.5 SENSITIVE Sensitive     OXACILLIN 0.5 SENSITIVE Sensitive     TETRACYCLINE <=1 SENSITIVE Sensitive     VANCOMYCIN <=0.5 SENSITIVE Sensitive     TRIMETH/SULFA <=10 SENSITIVE Sensitive     CLINDAMYCIN <=0.25 SENSITIVE Sensitive     RIFAMPIN <=0.5 SENSITIVE Sensitive     Inducible Clindamycin NEGATIVE Sensitive     * FEW STAPHYLOCOCCUS AUREUS  Culture, blood (Routine X 2) w Reflex to ID Panel     Status: Abnormal (Preliminary result)   Collection Time: 09/09/2017  9:25 AM  Result Value Ref Range Status   Specimen Description BLOOD LEFT FOREARM  Final   Special Requests   Final    BOTTLES DRAWN AEROBIC AND ANAEROBIC Blood  Culture adequate volume   Culture  Setup Time   Final    GRAM POSITIVE COCCI AEROBIC BOTTLE ONLY CRITICAL RESULT CALLED TO, READ BACK BY AND VERIFIED WITH: A MEYER,PHARMD AT [place] 09/10/2017 BY L BENFIELD    Culture (A)  Final    STAPHYLOCOCCUS AUREUS SUSCEPTIBILITIES TO FOLLOW Performed at [facility], [place] [address] [place], [place] [address]    Report Status PENDING  Incomplete  Culture, blood (single)     Status: None (Preliminary result)   Collection Time: 09/21/2017  9:32 AM  Result Value Ref Range Status   Specimen Description BLOOD RIGHT HAND  Final   Special Requests   Final    BOTTLES DRAWN AEROBIC ONLY Blood Culture adequate volume   Culture   Final    NO GROWTH 3 DAYS Performed at [facility], [place] [address], [place], [place] [address]    Report Status PENDING  Incomplete  Aerobic/Anaerobic Culture (surgical/deep wound)     Status: None (Preliminary result)   Collection Time: 09/12/17  1:53 PM  Result Value Ref Range Status   Specimen Description ABSCESS  Final   Special Requests Normal  Final   Gram Stain   Final    ABUNDANT WBC PRESENT, PREDOMINANTLY PMN ABUNDANT GRAM POSITIVE COCCI IN CLUSTERS Performed at [facility], [place] [address] [place], [place] [address]    Culture   Final    FEW STAPHYLOCOCCUS AUREUS NO ANAEROBES ISOLATED; CULTURE IN PROGRESS FOR 5 DAYS    Report Status PENDING  Incomplete   Organism ID, Bacteria STAPHYLOCOCCUS AUREUS  Final      Susceptibility   Staphylococcus aureus - MIC*    CIPROFLOXACIN <=0.5 SENSITIVE Sensitive     ERYTHROMYCIN <=0.25 SENSITIVE Sensitive     GENTAMICIN <=0.5 SENSITIVE Sensitive     OXACILLIN 0.5 SENSITIVE Sensitive     TETRACYCLINE <=1 SENSITIVE Sensitive     VANCOMYCIN <=0.5 SENSITIVE Sensitive     TRIMETH/SULFA <=10 SENSITIVE Sensitive     CLINDAMYCIN <=0.25 SENSITIVE Sensitive     RIFAMPIN <=0.5 SENSITIVE Sensitive     Inducible Clindamycin NEGATIVE Sensitive     * FEW STAPHYLOCOCCUS  AUREUS    Studies/Results: Dg Chest Port 1 View  Result Date: 09/15/2017 CLINICAL DATA:  Endotracheally intubated EXAM: PORTABLE CHEST 1 VIEW COMPARISON:  09/26/2017 FINDINGS: Stable cardiomegaly. Stable support tubes and lines. BILATERAL pulmonary opacities, with  RIGHT greater than LEFT pleural effusions, representing stable diffuse BILATERAL airspace disease. IMPRESSION: Stable exam. No significant improvement. Differential remains pulmonary edema versus pneumonia. Electronically Signed   By: Staci Righter M.D.   On: 09/15/2017 07:17   Dg Chest Port 1 View  Result Date: 09/10/2017 CLINICAL DATA:  Acute respiratory failure EXAM: PORTABLE CHEST 1 VIEW COMPARISON:  09/13/2017 FINDINGS: Endotracheal tube in good position. Right jugular catheter tip in the right atrium unchanged. NG tube enters the stomach with the tip not visualized. Diffuse bilateral airspace disease with mild improvement. Bibasilar atelectasis and effusion unchanged. No pneumothorax IMPRESSION: Support lines remain in satisfactory position Mild improvement in diffuse bilateral airspace disease. Bibasilar atelectasis and effusion unchanged. Electronically Signed   By: Franchot Gallo M.D.   On: 10/06/2017 08:15   [place] Lt Upper Extrem Ltd Soft Tissue Non Vascular  Result Date: 09/13/2017 CLINICAL DATA:  Severe sepsis and infection of the hand, survey for complex fluid collections along the volar hand and wrist. EXAM: ULTRASOUND left UPPER EXTREMITY LIMITED TECHNIQUE: Ultrasound examination of the upper extremity soft tissues was performed in the area of clinical concern. COMPARISON:  Radiographs from 08/23/2017 FINDINGS: The patient had recent irrigation and debridement of the radiocarpal joint and midcarpal joint demonstrating septic arthritis, and has continued severe swelling and concern for recurrent abscesses. In the volar hand extending from the base of the fingers through the wrist, there is abnormal cutaneous and subcutaneous edema but  without a definite abscess or fluid-filled pocket identified. IMPRESSION: 1. No drainable abscess is identified by ultrasound survey of the volar left wrist through the fingers. There is notable subcutaneous edema favoring cellulitis. Electronically Signed   By: Van Clines M.D.   On: 09/13/2017 16:39      Assessment/Plan:  INTERVAL HISTORY:   She is status extensive surgery by Dr.s Mardelle Matte and Gramig Principal Problem:   MSSA bacteremia Active Problems:   Thalassemia   Rheumatoid arthritis ([place])   Morbid obesity ([place])   Acute back pain   Essential hypertension   Diabetes ([place])   Dyslipidemia   Sepsis ([place])   Acute kidney injury ([place])   Immunosuppression due to drug therapy   Rheumatoid arthritis(714.0)   Septic joint of left wrist ([place])   Osteomyelitis of great toe of right foot ([place])   Hypertension   Hyperlipidemia   Acute hearing loss of left ear   Diabetes mellitus, insulin dependent (IDDM), controlled (HCC)   Beta thalassemia (HCC)   Symptomatic anemia   Elevated liver enzymes   Hyperbilirubinemia   Altered mental status, unspecified   Atrial flutter ([place])   Infection of right hand   Abscess   Acute renal failure ([place])   Acute respiratory failure ([place])    Jessica Howard is a 57 y.o. female with  RA  Admitted with MSSA B with septic shock, ARF, shock liver with evidence of endocarditis and "metastatic infection:"With severe infection of her right ankle and toe that will require a below the knee amputation extensive infection in bilateral wrists left worse than right status post surgery by Dr. Amedeo Plenty, also with L2-L3 septic facet joint with pus collection that has been drained  #1 MSSAB with shock metastatic disease:       [facility] Staphylococcus aureus bacteremia   Staphylococcus aureus bacteremia (SAB) is associated with a high rate of complications and mortality.  Specific aspects of clinical management are critical to  optimizing the outcome of patients with SAB.  Therefore, the [facility] Antimicrobial Management Team ([facility]) has initiated an intervention aimed at improving the management of SAB at [facility].  To do so, Infectious Diseases physicians are providing an evidence-based consult for the management of all patients with SAB.     Yes No Comments  Perform follow-up blood cultures (even if the patient is afebrile) to ensure clearance of bacteremia '[x]'$  '[]'$  09/21/2017 NG but we need POST CENTRAL LINE REMOVAL blood cultures done to prove clearance with lines gone  Remove vascular catheter and obtain follow-up blood cultures after the removal of the catheter '[]'$  '[]'$  HD line holiday when possible If taken out today grab cultures afterwards so we can try to prove clearance.   Perform echocardiography to evaluate for endocarditis (transthoracic ECHO is 40-50% sensitive, TEE is > 90% sensitive) '[]'$  '[]'$  Please keep in mind, that neither test can definitively EXCLUDE endocarditis, and that should clinical suspicion remain high for endocarditis the patient should then still be treated with an "endocarditis" duration of therapy = 6 weeks  Thickened leaflets on the mitral valve but without clear-cut vegetation  Consult electrophysiologist to evaluate implanted cardiac device (pacemaker, ICD) '[]'$  '[]'$    Ensure source control '[]'$  '[]'$  Have all abscesses been drained effectively? Have deep seeded infections (septic joints or osteomyelitis) had appropriate surgical debridement?    She is going back to the operating room today for below the knee amputation on the right and potentially further surgeries in her upper extremities  Investigate for "metastatic" sites of infection '[]'$  '[]'$  Does the patient have ANY symptom or physical exam finding that would suggest a deeper infection (back or neck pain that may be suggestive of vertebral osteomyelitis or epidural abscess, muscle pain that could be a symptom of pyomyositis)?  Keep in mind that  for deep seeded infections MRI imaging with contrast is preferred rather than other often insensitive tests such as plain x-rays, especially early in a patient's presentation.  She has had aggressive surgery by Dr. Amedeo Plenty and Mardelle Matte. Collection of pus near facet joint has been drained  She does have pleural effusions   Change antibiotic therapy to daptomycin '[]'$  '[]'$  Beta-lactam antibiotics are preferred for MSSA due to higher cure rates.   If on Vancomycin, goal trough should be 15 - 20 mcg/mL  Estimated duration of IV antibiotic therapy:  8 weeks '[]'$  '[]'$  Consult case management for probably prolonged outpatient IV antibiotic therapy   #2 Wrist infections: see above  #3 Left foot infection: will get TTA  #4 LBP: facet jiont infection  #5 Neuro: confusion: more likely due to multifactorial problems she did not have left sided endocarditis  #6 Pleural effusions: ? Raised re dapto and lungs. Main issue with dapto is that it is inactivated by sufactant in the lungs and that it did not perform well in clinical trial for pneumonia. However it penetrates lungs and pleural space (I have more anxiety about that area at present) In setting of right sided endocarditis w septic emboli many ID clininians argue that surfactant issue is irrelevant due to destructive cavitary nature of infection here. If she develops evidence of lobar pneumonia may need to think about adding an agent that will not have the surfactant issue.  Her extensive allergies to beta lactams make that not simple.  Her thrombocytopenia makes Zyvox not an easy option either although she is not severely thrombocytopenic  I would consider agnostic and therapeutic thoracentesis effusions continue to progress   LOS: 10 days   Jessica Howard 09/15/2017, 10:59  AM

## 2017-09-15 NOTE — Progress Notes (Signed)
[place] for Heparin Indication: atrial fibrillation   Patient Measurements: Height: 5\' 2"  (157.5 cm) Weight: 286 lb 6 oz (129.9 kg) IBW/kg (Calculated) : 50.1 Heparin Dosing Weight: 81 kg  Vital Signs: Temp: 99.5 F (37.5 C) (04/08 0717) Temp Source: Oral (04/08 0717) BP: 122/52 (04/08 0800) Pulse Rate: 74 (04/08 0800)    Assessment: 24 yoF with disseminated MSSA bacteremia and rapid Afib noted 4/1 - s/p DCCV on 4/4. Held initially post-op due to drops in Hgb and post-op bleeding. The patient is s/p repeat I&D Sunday. Heparin restarted and remains subtherapeutic at 1750/hr - no issues with infusion per RN, CBC remains low but stable.  Goal of Therapy:  Heparin level 0.3-0.7 units/ml Monitor platelets by anticoagulation protocol: Yes    Plan:  -Increase heparin to 2050 units/hr -Check 8-hr heparin level -Daily heparin level, CBC, S/Sx bleeding  Arrie Senate, PharmD, BCPS PGY-2 Cardiology Pharmacy Resident Pager: [phone] 09/15/2017

## 2017-09-16 ENCOUNTER — Telehealth: Payer: Self-pay | Admitting: Pharmacist

## 2017-09-16 ENCOUNTER — Encounter (HOSPITAL_COMMUNITY): Admission: EM | Disposition: E | Payer: Self-pay | Source: Home / Self Care | Attending: Internal Medicine

## 2017-09-16 ENCOUNTER — Inpatient Hospital Stay (HOSPITAL_COMMUNITY): Payer: Medicare Other | Admitting: Anesthesiology

## 2017-09-16 ENCOUNTER — Encounter (HOSPITAL_COMMUNITY): Payer: Self-pay | Admitting: Anesthesiology

## 2017-09-16 ENCOUNTER — Inpatient Hospital Stay (HOSPITAL_COMMUNITY): Payer: Medicare Other

## 2017-09-16 ENCOUNTER — Ambulatory Visit: Payer: Self-pay | Admitting: Pharmacist

## 2017-09-16 DIAGNOSIS — M00032 Staphylococcal arthritis, left wrist: Secondary | ICD-10-CM

## 2017-09-16 DIAGNOSIS — L02415 Cutaneous abscess of right lower limb: Secondary | ICD-10-CM

## 2017-09-16 DIAGNOSIS — M00031 Staphylococcal arthritis, right wrist: Secondary | ICD-10-CM

## 2017-09-16 HISTORY — PX: AMPUTATION: SHX166

## 2017-09-16 HISTORY — PX: INCISION AND DRAINAGE: SHX5863

## 2017-09-16 LAB — POCT I-STAT 4, (NA,K, GLUC, HGB,HCT)
Glucose, Bld: 89 mg/dL (ref 65–99)
HCT: 22 % — ABNORMAL LOW (ref 36.0–46.0)
Hemoglobin: 7.5 g/dL — ABNORMAL LOW (ref 12.0–15.0)
POTASSIUM: 4.7 mmol/L (ref 3.5–5.1)
Sodium: 137 mmol/L (ref 135–145)

## 2017-09-16 LAB — CBC
HCT: 25.5 % — ABNORMAL LOW (ref 36.0–46.0)
Hemoglobin: 7.9 g/dL — ABNORMAL LOW (ref 12.0–15.0)
MCH: 23.5 pg — ABNORMAL LOW (ref 26.0–34.0)
MCHC: 31 g/dL (ref 30.0–36.0)
MCV: 75.9 fL — ABNORMAL LOW (ref 78.0–100.0)
PLATELETS: 178 10*3/uL (ref 150–400)
RBC: 3.36 MIL/uL — AB (ref 3.87–5.11)
RDW: 26.1 % — ABNORMAL HIGH (ref 11.5–15.5)
WBC: 15.4 10*3/uL — ABNORMAL HIGH (ref 4.0–10.5)

## 2017-09-16 LAB — RENAL FUNCTION PANEL
ANION GAP: 11 (ref 5–15)
Albumin: 1.4 g/dL — ABNORMAL LOW (ref 3.5–5.0)
BUN: 42 mg/dL — ABNORMAL HIGH (ref 6–20)
CO2: 19 mmol/L — AB (ref 22–32)
Calcium: 7.5 mg/dL — ABNORMAL LOW (ref 8.9–10.3)
Chloride: 101 mmol/L (ref 101–111)
Creatinine, Ser: 4.06 mg/dL — ABNORMAL HIGH (ref 0.44–1.00)
GFR calc Af Amer: 13 mL/min — ABNORMAL LOW (ref >60)
GFR calc non Af Amer: 11 mL/min — ABNORMAL LOW (ref >60)
GLUCOSE: 112 mg/dL — AB (ref 65–99)
POTASSIUM: 5.7 mmol/L — AB (ref 3.5–5.1)
Phosphorus: 7 mg/dL — ABNORMAL HIGH (ref 2.5–4.6)
Sodium: 131 mmol/L — ABNORMAL LOW (ref 135–145)

## 2017-09-16 LAB — CULTURE, BLOOD (SINGLE)
Culture: NO GROWTH
SPECIAL REQUESTS: ADEQUATE

## 2017-09-16 LAB — GLUCOSE, CAPILLARY
GLUCOSE-CAPILLARY: 122 mg/dL — AB (ref 65–99)
GLUCOSE-CAPILLARY: 91 mg/dL (ref 65–99)
GLUCOSE-CAPILLARY: 95 mg/dL (ref 65–99)
GLUCOSE-CAPILLARY: 95 mg/dL (ref 65–99)
Glucose-Capillary: 106 mg/dL — ABNORMAL HIGH (ref 65–99)

## 2017-09-16 LAB — PREPARE RBC (CROSSMATCH)

## 2017-09-16 LAB — CULTURE, BLOOD (ROUTINE X 2): Special Requests: ADEQUATE

## 2017-09-16 LAB — HEPARIN LEVEL (UNFRACTIONATED): Heparin Unfractionated: <0.1 IU/mL — ABNORMAL LOW (ref 0.30–0.70)

## 2017-09-16 LAB — MAGNESIUM: Magnesium: 2.3 mg/dL (ref 1.7–2.4)

## 2017-09-16 SURGERY — AMPUTATION BELOW KNEE
Anesthesia: General | Site: Leg Lower | Laterality: Right

## 2017-09-16 MED ORDER — EPHEDRINE 5 MG/ML INJ
INTRAVENOUS | Status: AC
  Filled 2017-09-16: qty 10

## 2017-09-16 MED ORDER — SODIUM CHLORIDE 0.9 % IV SOLN
1.0000 g | Freq: Once | INTRAVENOUS | Status: AC
  Administered 2017-09-16: 1 g via INTRAVENOUS
  Filled 2017-09-16: qty 10

## 2017-09-16 MED ORDER — BUPIVACAINE HCL (PF) 0.25 % IJ SOLN
INTRAMUSCULAR | Status: AC
  Filled 2017-09-16: qty 30

## 2017-09-16 MED ORDER — SODIUM CHLORIDE 0.9 % IR SOLN
Status: DC | PRN
  Administered 2017-09-16: 3000 mL
  Administered 2017-09-16: 1000 mL
  Administered 2017-09-16 (×2): 3000 mL

## 2017-09-16 MED ORDER — SODIUM CHLORIDE 0.9 % IV SOLN
1000.0000 mg | INTRAVENOUS | Status: DC
  Administered 2017-09-16 – 2017-09-18 (×2): 1000 mg via INTRAVENOUS
  Filled 2017-09-16 (×2): qty 20

## 2017-09-16 MED ORDER — SODIUM CHLORIDE 0.9 % IV SOLN
1000.0000 mg | INTRAVENOUS | Status: DC
  Filled 2017-09-16: qty 20

## 2017-09-16 MED ORDER — PHENYLEPHRINE 40 MCG/ML (10ML) SYRINGE FOR IV PUSH (FOR BLOOD PRESSURE SUPPORT)
PREFILLED_SYRINGE | INTRAVENOUS | Status: AC
  Filled 2017-09-16: qty 10

## 2017-09-16 MED ORDER — ROCURONIUM BROMIDE 100 MG/10ML IV SOLN
INTRAVENOUS | Status: DC | PRN
  Administered 2017-09-16 (×2): 25 mg via INTRAVENOUS
  Administered 2017-09-16: 50 mg via INTRAVENOUS
  Administered 2017-09-16: 30 mg via INTRAVENOUS

## 2017-09-16 MED ORDER — CALCIUM GLUCONATE 10 % IV SOLN: 1.0000 g | Freq: Once | INTRAVENOUS | Status: DC

## 2017-09-16 MED ORDER — FENTANYL CITRATE (PF) 250 MCG/5ML IJ SOLN
INTRAMUSCULAR | Status: AC
  Filled 2017-09-16: qty 5

## 2017-09-16 MED ORDER — SODIUM CHLORIDE 0.9 % IV SOLN: Freq: Once | INTRAVENOUS | Status: DC

## 2017-09-16 MED ORDER — SODIUM CHLORIDE 0.9 % IV SOLN
Freq: Once | INTRAVENOUS | Status: AC
  Administered 2017-09-16: 19:00:00 via INTRAVENOUS

## 2017-09-16 MED ORDER — SODIUM CHLORIDE 0.9 % IV SOLN
0.0000 ug/min | INTRAVENOUS | Status: DC
  Administered 2017-09-16: 75 ug/min via INTRAVENOUS
  Administered 2017-09-16: 150 ug/min via INTRAVENOUS
  Administered 2017-09-17: 60 ug/min via INTRAVENOUS
  Administered 2017-09-18: 90 ug/min via INTRAVENOUS
  Administered 2017-09-19: 65 ug/min via INTRAVENOUS
  Administered 2017-09-20: 30 ug/min via INTRAVENOUS
  Filled 2017-09-16 (×5): qty 40
  Filled 2017-09-16: qty 4

## 2017-09-16 MED ORDER — 0.9 % SODIUM CHLORIDE (POUR BTL) OPTIME
TOPICAL | Status: DC | PRN
  Administered 2017-09-16 (×2): 1000 mL

## 2017-09-16 MED ORDER — FENTANYL CITRATE (PF) 100 MCG/2ML IJ SOLN
INTRAMUSCULAR | Status: DC | PRN
  Administered 2017-09-16 (×3): 50 ug via INTRAVENOUS

## 2017-09-16 MED ORDER — DAPTOMYCIN 500 MG IV SOLR: 1000.0000 mg | INTRAVENOUS | Status: DC

## 2017-09-16 MED ORDER — GLYCOPYRROLATE 0.2 MG/ML IJ SOLN
INTRAMUSCULAR | Status: DC | PRN
  Administered 2017-09-16: 0.2 mg via INTRAVENOUS

## 2017-09-16 MED ORDER — ALBUTEROL SULFATE (2.5 MG/3ML) 0.083% IN NEBU: 2.5000 mg | INHALATION_SOLUTION | RESPIRATORY_TRACT | Status: DC | PRN

## 2017-09-16 SURGICAL SUPPLY — 75 items
BANDAGE ACE 4X5 VEL STRL LF (GAUZE/BANDAGES/DRESSINGS) ×6 IMPLANT
BANDAGE ACE 6X5 VEL STRL LF (GAUZE/BANDAGES/DRESSINGS) ×16 IMPLANT
BANDAGE ESMARK 6X9 LF (GAUZE/BANDAGES/DRESSINGS) IMPLANT
BLADE SAW SGTL HD 18.5X60.5X1. (BLADE) ×2 IMPLANT
BNDG CMPR 9X4 STRL LF SNTH (GAUZE/BANDAGES/DRESSINGS) ×2
BNDG CMPR 9X6 STRL LF SNTH (GAUZE/BANDAGES/DRESSINGS)
BNDG ESMARK 4X9 LF (GAUZE/BANDAGES/DRESSINGS) ×4 IMPLANT
BNDG ESMARK 6X9 LF (GAUZE/BANDAGES/DRESSINGS)
BNDG GAUZE ELAST 4 BULKY (GAUZE/BANDAGES/DRESSINGS) ×3 IMPLANT
BOOTCOVER CLEANROOM LRG (PROTECTIVE WEAR) ×8 IMPLANT
COVER SURGICAL LIGHT HANDLE (MISCELLANEOUS) ×6 IMPLANT
CUFF TOURNIQUET SINGLE 18IN (TOURNIQUET CUFF) ×3 IMPLANT
CUFF TOURNIQUET SINGLE 24IN (TOURNIQUET CUFF) ×2 IMPLANT
CUFF TOURNIQUET SINGLE 34IN LL (TOURNIQUET CUFF) IMPLANT
CUFF TOURNIQUET SINGLE 44IN (TOURNIQUET CUFF) IMPLANT
DECANTER SPIKE VIAL GLASS SM (MISCELLANEOUS) IMPLANT
DRAIN PENROSE 1/4X12 LTX STRL (WOUND CARE) ×12 IMPLANT
DRAPE C-ARM 42X72 X-RAY (DRAPES) IMPLANT
DRAPE OEC MINIVIEW 54X84 (DRAPES) IMPLANT
DRAPE ORTHO SPLIT 77X108 STRL (DRAPES) ×4
DRAPE SURG ORHT 6 SPLT 77X108 (DRAPES) ×1 IMPLANT
DRAPE U-SHAPE 47X51 STRL (DRAPES) IMPLANT
DRSG PAD ABDOMINAL 8X10 ST (GAUZE/BANDAGES/DRESSINGS) ×25 IMPLANT
DURAPREP 26ML APPLICATOR (WOUND CARE) ×4 IMPLANT
ELECT REM PT RETURN 9FT ADLT (ELECTROSURGICAL) ×4
ELECTRODE REM PT RTRN 9FT ADLT (ELECTROSURGICAL) ×2 IMPLANT
GAUZE SPONGE 4X4 12PLY STRL (GAUZE/BANDAGES/DRESSINGS) ×13 IMPLANT
GAUZE XEROFORM 1X8 LF (GAUZE/BANDAGES/DRESSINGS) ×6 IMPLANT
GAUZE XEROFORM 5X9 LF (GAUZE/BANDAGES/DRESSINGS) ×4 IMPLANT
GLOVE BIOGEL PI IND STRL 8 (GLOVE) ×1 IMPLANT
GLOVE BIOGEL PI INDICATOR 8 (GLOVE) ×2
GLOVE BIOGEL PI ORTHO PRO SZ8 (GLOVE) ×4
GLOVE ECLIPSE 8.0 STRL XLNG CF (GLOVE) ×8 IMPLANT
GLOVE ORTHO TXT STRL SZ7.5 (GLOVE) ×4 IMPLANT
GLOVE PI ORTHO PRO STRL SZ8 (GLOVE) ×4 IMPLANT
GLOVE SS BIOGEL STRL SZ 8 (GLOVE) IMPLANT
GLOVE SUPERSENSE BIOGEL SZ 8 (GLOVE) ×2
GLOVE SURG ORTHO 8.0 STRL STRW (GLOVE) ×4 IMPLANT
GLOVE SURG SS PI 6.5 STRL IVOR (GLOVE) ×4 IMPLANT
GOWN STRL REUS W/ TWL XL LVL3 (GOWN DISPOSABLE) ×2 IMPLANT
GOWN STRL REUS W/TWL 2XL LVL3 (GOWN DISPOSABLE) IMPLANT
GOWN STRL REUS W/TWL XL LVL3 (GOWN DISPOSABLE) ×8
KIT BASIN OR (CUSTOM PROCEDURE TRAY) ×4 IMPLANT
KIT TURNOVER KIT B (KITS) ×4 IMPLANT
MANIFOLD NEPTUNE II (INSTRUMENTS) ×4 IMPLANT
NS IRRIG 1000ML POUR BTL (IV SOLUTION) ×4 IMPLANT
PACK ORTHO EXTREMITY (CUSTOM PROCEDURE TRAY) ×4 IMPLANT
PAD ARMBOARD 7.5X6 YLW CONV (MISCELLANEOUS) ×8 IMPLANT
PAD CAST 4YDX4 CTTN HI CHSV (CAST SUPPLIES) ×4 IMPLANT
PADDING CAST COTTON 4X4 STRL (CAST SUPPLIES) ×4
PADDING CAST COTTON 6X4 STRL (CAST SUPPLIES) ×2 IMPLANT
PENCIL BUTTON HOLSTER BLD 10FT (ELECTRODE) ×3 IMPLANT
SET CYSTO W/LG BORE CLAMP LF (SET/KITS/TRAYS/PACK) ×8 IMPLANT
SPLINT FIBERGLASS 4X30 (CAST SUPPLIES) ×3 IMPLANT
SPONGE LAP 18X18 X RAY DECT (DISPOSABLE) ×12 IMPLANT
SPONGE LAP 4X18 X RAY DECT (DISPOSABLE) ×8 IMPLANT
STAPLER VISISTAT 35W (STAPLE) ×4 IMPLANT
SUCTION FRAZIER HANDLE 10FR (MISCELLANEOUS) ×2
SUCTION TUBE FRAZIER 10FR DISP (MISCELLANEOUS) ×2 IMPLANT
SUT ETHILON 3 0 PS 1 (SUTURE) ×21 IMPLANT
SUT VIC AB 0 CT1 27 (SUTURE) ×12
SUT VIC AB 0 CT1 27XBRD ANBCTR (SUTURE) ×3 IMPLANT
SUT VIC AB 2-0 CT1 27 (SUTURE) ×4
SUT VIC AB 2-0 CT1 TAPERPNT 27 (SUTURE) ×1 IMPLANT
SYR CONTROL 10ML LL (SYRINGE) ×2 IMPLANT
TOWEL GREEN STERILE (TOWEL DISPOSABLE) ×3 IMPLANT
TOWEL OR 17X24 6PK STRL BLUE (TOWEL DISPOSABLE) ×8 IMPLANT
TOWEL OR 17X26 10 PK STRL BLUE (TOWEL DISPOSABLE) ×4 IMPLANT
TUBE CONNECTING 12'X1/4 (SUCTIONS) ×1
TUBE CONNECTING 12X1/4 (SUCTIONS) ×3 IMPLANT
TUBE CONNECTING 20'X1/4 (TUBING) ×1
TUBE CONNECTING 20X1/4 (TUBING) ×1 IMPLANT
UNDERPAD 30X30 (UNDERPADS AND DIAPERS) ×4 IMPLANT
WATER STERILE IRR 1000ML POUR (IV SOLUTION) ×4 IMPLANT
YANKAUER SUCT BULB TIP NO VENT (SUCTIONS) ×3 IMPLANT

## 2017-09-16 NOTE — Anesthesia Preprocedure Evaluation (Signed)
Anesthesia Evaluation  Patient identified by MRN, date of birth, ID band Patient unresponsive  General Assessment Comment:MSSA septicemia with diffuse seeding of joints/spine, osteo of right great toe.   Reviewed: Allergy & Precautions, H&P , Patient's Chart, lab work & pertinent test results, Unable to perform ROS - Chart review only  Airway Mallampati: Intubated   Neck ROM: full    Dental   Pulmonary asthma , sleep apnea , former smoker,    breath sounds clear to auscultation   + intubated    Cardiovascular hypertension, Pt. on medications +CHF  + dysrhythmias (currently sinus rhythm and rate controlled on amiodarone ) Atrial Fibrillation  Rhythm:Regular Rate:Tachycardia  Septic TEE 09/24/2017: Impressions:  - LVEF 60-65%, no LAA thrombus, interatrial septal aneurysm without PFO, mitral valve thickening - cannot exclude endocarditis, trace to mild MR, moderate LVH, successful cardioversion with a single 150J biphasic shock to NSR.   Neuro/Psych  Headaches,    GI/Hepatic GERD  Medicated,  Endo/Other  diabetes, Type 2Hypothyroidism Morbid obesity  Renal/GU ARF and DialysisRenal disease     Musculoskeletal  (+) Arthritis , Osteoarthritis and Rheumatoid disorders,    Abdominal   Peds  Hematology  (+) Blood dyscrasia, anemia , Beta thalassemia w/chronic anemia   Anesthesia Other Findings   Reproductive/Obstetrics                             Anesthesia Physical  Anesthesia Plan  ASA: IV  Anesthesia Plan: General   Post-op Pain Management:    Induction: Intravenous  PONV Risk Score and Plan: 3 and Ondansetron and Treatment may vary due to age or medical condition  Airway Management Planned: Oral ETT  Additional Equipment:   Intra-op Plan:   Post-operative Plan: Post-operative intubation/ventilation  Informed Consent: I have reviewed the patients History and Physical, chart, labs and  discussed the procedure including the risks, benefits and alternatives for the proposed anesthesia with the patient or authorized representative who has indicated his/her understanding and acceptance.   History available from chart only  Plan Discussed with: Anesthesiologist  Anesthesia Plan Comments:         Anesthesia Quick Evaluation

## 2017-09-16 NOTE — Progress Notes (Signed)
Subjective:  Intubated and sedated  Antibiotics:  Anti-infectives (From admission, onward)   Start     Dose/Rate Route Frequency Ordered Stop   09/25/2017 1800  DAPTOmycin (CUBICIN) 1,000 mg in sodium chloride 0.9 % IVPB     1,000 mg 240 mL/hr over 30 Minutes Intravenous Every 48 hours 09/29/2017 1103     09/08/17 1600  DAPTOmycin (CUBICIN) 1,000 mg in sodium chloride 0.9 % IVPB  Status:  Discontinued     1,000 mg 240 mL/hr over 30 Minutes Intravenous Every 48 hours 09/07/17 1019 10/04/2017 1103   09/07/17 1000  vancomycin (VANCOCIN) 1,500 mg in sodium chloride 0.9 % 500 mL IVPB  Status:  Discontinued     1,500 mg 250 mL/hr over 120 Minutes Intravenous Every 48 hours 08/29/2017 0906 09/06/17 0935   09/06/17 1600  DAPTOmycin (CUBICIN) 984 mg in sodium chloride 0.9 % IVPB  Status:  Discontinued     8 mg/kg  123 kg 239.4 mL/hr over 30 Minutes Intravenous Every 48 hours 09/06/17 1436 09/07/17 1019   09/06/17 1000  aztreonam (AZACTAM) 0.5 g in dextrose 5 % 50 mL IVPB  Status:  Discontinued     0.5 g 100 mL/hr over 30 Minutes Intravenous Every 8 hours 09/06/17 0943 09/06/17 1223   08/13/2017 2000  metroNIDAZOLE (FLAGYL) IVPB 500 mg  Status:  Discontinued     500 mg 100 mL/hr over 60 Minutes Intravenous Every 8 hours 08/31/2017 1343 09/06/17 0028   08/14/2017 0900  aztreonam (AZACTAM) 2 g in sodium chloride 0.9 % 100 mL IVPB  Status:  Discontinued     2 g 200 mL/hr over 30 Minutes Intravenous Every 8 hours 08/08/2017 0853 09/06/17 0028   08/15/2017 0900  metroNIDAZOLE (FLAGYL) IVPB 500 mg     500 mg 100 mL/hr over 60 Minutes Intravenous  Once 09/04/2017 0853 09/04/2017 1035   08/18/2017 0830  piperacillin-tazobactam (ZOSYN) IVPB 3.375 g  Status:  Discontinued     3.375 g 100 mL/hr over 30 Minutes Intravenous  Once 08/31/2017 0816 08/12/2017 0836   08/09/2017 0830  vancomycin (VANCOCIN) IVPB 1000 mg/200 mL premix  Status:  Discontinued     1,000 mg 200 mL/hr over 60 Minutes Intravenous  Once 08/09/2017  0816 08/12/2017 0822   08/17/2017 0830  vancomycin (VANCOCIN) 2,000 mg in sodium chloride 0.9 % 500 mL IVPB     2,000 mg 250 mL/hr over 120 Minutes Intravenous  Once 08/26/2017 0822 08/23/2017 1342      Medications: Scheduled Meds: . amiodarone  200 mg Oral BID  . chlorhexidine gluconate (MEDLINE KIT)  15 mL Mouth Rinse BID  . Chlorhexidine Gluconate Cloth  6 each Topical Daily  . docusate  100 mg Per Tube BID  . feeding supplement (PRO-STAT SUGAR FREE 64)  60 mL Per Tube TID  . feeding supplement (VITAL HIGH PROTEIN)  1,000 mL Per Tube Q24H  . insulin aspart  0-15 Units Subcutaneous Q4H  . lactulose  10 g Oral Daily  . mouth rinse  15 mL Mouth Rinse 10 times per day  . pantoprazole (PROTONIX) IV  40 mg Intravenous Q24H  . sennosides  5 mL Per Tube Daily  . sodium chloride flush  3 mL Intravenous Q12H   Continuous Infusions: . sodium chloride 10 mL/hr at 09/30/2017 1110  . sodium chloride    . DAPTOmycin (CUBICIN)  IV    . fentaNYL infusion INTRAVENOUS 125 mcg/hr (09/10/2017 1046)  . phenylephrine (NEO-SYNEPHRINE) Adult infusion 125 mcg/min (  09/21/2017 1114)   PRN Meds:.acetaminophen **OR** acetaminophen, fentaNYL, LORazepam, ondansetron **OR** ondansetron (ZOFRAN) IV    Objective: Weight change: 15 lb 6.9 oz (7 kg)  Intake/Output Summary (Last 24 hours) at 09/25/2017 1149 Last data filed at 09/13/2017 1014 Gross per 24 hour  Intake 4113.1 ml  Output 60 ml  Net 4053.1 ml   Blood pressure (!) 96/46, pulse 61, temperature 97.6 F (36.4 C), temperature source Rectal, resp. rate 18, height 5' 2"  (1.575 m), weight (!) 301 lb 13 oz (136.9 kg), last menstrual period 12/27/2013, SpO2 100 %. Temp:  [97.6 F (36.4 C)-102.7 F (39.3 C)] 97.6 F (36.4 C) (04/09 1115) Pulse Rate:  [60-104] 61 (04/09 1133) Resp:  [0-27] 18 (04/09 1133) BP: (67-126)/(38-103) 96/46 (04/09 1133) SpO2:  [95 %-100 %] 100 % (04/09 1133) FiO2 (%):  [40 %] 40 % (04/09 1133) Weight:  [301 lb 13 oz (136.9 kg)] 301 lb 13  oz (136.9 kg) (04/09 0352)  Physical Exam: General: Intubated  HEENT: anicteric sclera,EOMI CVS regular rate, normal r,  no murmur rubs or gallops Chest: clear to auscultation anteriorly  abdomen: soft nontender, nondistended, normal bowel sounds, Extremities/skin:  She has a vacuum dressing in her left wrist and her right wrist is wrapped extensively right lower extremity also dressed   Neuro: nonfocal  CBC:    BMET Recent Labs    09/15/17 1452 09/10/2017 0500  NA 132* 131*  K 4.7 5.7*  CL 99* 101  CO2 21* 19*  GLUCOSE 108* 112*  BUN 32* 42*  CREATININE 3.14* 4.06*  CALCIUM 7.7* 7.5*     Liver Panel  Recent Labs    09/15/17 0408 09/15/2017 0500  PROT 5.7*  --   ALBUMIN 1.4*  1.5* 1.4*  AST 55*  --   ALT 22  --   ALKPHOS 519*  --   BILITOT 4.1*  --   BILIDIR 2.7*  --   IBILI 1.4*  --        Sedimentation Rate No results for input(s): ESRSEDRATE in the last 72 hours. C-Reactive Protein No results for input(s): CRP in the last 72 hours.  Micro Results: Recent Results (from the past 720 hour(s))  Blood Culture (routine x 2)     Status: Abnormal   Collection Time: 08/11/2017  9:50 AM  Result Value Ref Range Status   Specimen Description BLOOD RIGHT HAND  Final   Special Requests   Final    BOTTLES DRAWN AEROBIC AND ANAEROBIC Blood Culture adequate volume   Culture  Setup Time   Final    GRAM POSITIVE COCCI IN BOTH AEROBIC AND ANAEROBIC BOTTLES CRITICAL RESULT CALLED TO, READ BACK BY AND VERIFIED WITH: G ABBOTT 09/06/17 0006 JDW    Culture (A)  Final    STAPHYLOCOCCUS AUREUS SUSCEPTIBILITIES PERFORMED ON PREVIOUS CULTURE WITHIN THE LAST 5 DAYS. Performed at [facility], [place] [address], [place], [place] [address]    Report Status 09/08/2017 FINAL  Final  Blood Culture (routine x 2)     Status: Abnormal   Collection Time: 08/24/2017  1:54 PM  Result Value Ref Range Status   Specimen Description BLOOD RIGHT FOREARM  Final   Special Requests    Final    BOTTLES DRAWN AEROBIC AND ANAEROBIC Blood Culture adequate volume   Culture  Setup Time   Final    GRAM POSITIVE COCCI IN BOTH AEROBIC AND ANAEROBIC BOTTLES CRITICAL RESULT CALLED TO, READ BACK BY AND VERIFIED WITH: G ABBOTT PHARMD 09/06/17  0006 JDW Performed at [facility], [place] [address], [place], [place] [address]    Culture STAPHYLOCOCCUS AUREUS (A)  Final   Report Status 09/08/2017 FINAL  Final   Organism ID, Bacteria STAPHYLOCOCCUS AUREUS  Final      Susceptibility   Staphylococcus aureus - MIC*    CIPROFLOXACIN <=0.5 SENSITIVE Sensitive     ERYTHROMYCIN <=0.25 SENSITIVE Sensitive     GENTAMICIN <=0.5 SENSITIVE Sensitive     OXACILLIN 0.5 SENSITIVE Sensitive     TETRACYCLINE <=1 SENSITIVE Sensitive     VANCOMYCIN 1 SENSITIVE Sensitive     TRIMETH/SULFA <=10 SENSITIVE Sensitive     CLINDAMYCIN <=0.25 SENSITIVE Sensitive     RIFAMPIN <=0.5 SENSITIVE Sensitive     Inducible Clindamycin NEGATIVE Sensitive     * STAPHYLOCOCCUS AUREUS  Blood Culture ID Panel (Reflexed)     Status: Abnormal   Collection Time: 08/17/2017  1:54 PM  Result Value Ref Range Status   Enterococcus species NOT DETECTED NOT DETECTED Final   Listeria monocytogenes NOT DETECTED NOT DETECTED Final   Staphylococcus species DETECTED (A) NOT DETECTED Final    Comment: CRITICAL RESULT CALLED TO, READ BACK BY AND VERIFIED WITH: G ABBOTT PHARMD 09/06/17 0006 JDW    Staphylococcus aureus DETECTED (A) NOT DETECTED Final    Comment: Methicillin (oxacillin) susceptible Staphylococcus aureus (MSSA). Preferred therapy is anti staphylococcal beta lactam antibiotic (Cefazolin or Nafcillin), unless clinically contraindicated. CRITICAL RESULT CALLED TO, READ BACK BY AND VERIFIED WITH: G ABBOTT PHARMD 09/06/17 0006 JDW    Methicillin resistance NOT DETECTED NOT DETECTED Final   Streptococcus species NOT DETECTED NOT DETECTED Final   Streptococcus agalactiae NOT DETECTED NOT DETECTED Final   Streptococcus  pneumoniae NOT DETECTED NOT DETECTED Final   Streptococcus pyogenes NOT DETECTED NOT DETECTED Final   Acinetobacter baumannii NOT DETECTED NOT DETECTED Final   Enterobacteriaceae species NOT DETECTED NOT DETECTED Final   Enterobacter cloacae complex NOT DETECTED NOT DETECTED Final   Escherichia coli NOT DETECTED NOT DETECTED Final   Klebsiella oxytoca NOT DETECTED NOT DETECTED Final   Klebsiella pneumoniae NOT DETECTED NOT DETECTED Final   Proteus species NOT DETECTED NOT DETECTED Final   Serratia marcescens NOT DETECTED NOT DETECTED Final   Haemophilus influenzae NOT DETECTED NOT DETECTED Final   Neisseria meningitidis NOT DETECTED NOT DETECTED Final   Pseudomonas aeruginosa NOT DETECTED NOT DETECTED Final   Candida albicans NOT DETECTED NOT DETECTED Final   Candida glabrata NOT DETECTED NOT DETECTED Final   Candida krusei NOT DETECTED NOT DETECTED Final   Candida parapsilosis NOT DETECTED NOT DETECTED Final   Candida tropicalis NOT DETECTED NOT DETECTED Final    Comment: Performed at [facility], [place] [address], [place], [place] [address]  Urine culture     Status: Abnormal   Collection Time: 08/14/2017  3:56 PM  Result Value Ref Range Status   Specimen Description URINE, RANDOM  Final   Special Requests   Final    NONE Performed at [facility], [place] [address] [place], [place] [address]    Culture 50,000 COLONIES/mL STAPHYLOCOCCUS AUREUS (A)  Final   Report Status 09/07/2017 FINAL  Final   Organism ID, Bacteria STAPHYLOCOCCUS AUREUS (A)  Final      Susceptibility   Staphylococcus aureus - MIC*    CIPROFLOXACIN <=0.5 SENSITIVE Sensitive     GENTAMICIN <=0.5 SENSITIVE Sensitive     NITROFURANTOIN 32 SENSITIVE Sensitive     OXACILLIN 0.5 SENSITIVE Sensitive  TETRACYCLINE <=1 SENSITIVE Sensitive     VANCOMYCIN 1 SENSITIVE Sensitive     TRIMETH/SULFA <=10 SENSITIVE Sensitive     CLINDAMYCIN <=0.25 SENSITIVE Sensitive     RIFAMPIN <=0.5 SENSITIVE Sensitive      Inducible Clindamycin NEGATIVE Sensitive     * 50,000 COLONIES/mL STAPHYLOCOCCUS AUREUS  MRSA PCR Screening     Status: None   Collection Time: 09/06/17  5:20 AM  Result Value Ref Range Status   MRSA by PCR NEGATIVE NEGATIVE Final    Comment:        The GeneXpert MRSA Assay (FDA approved for NASAL specimens only), is one component of a comprehensive MRSA colonization surveillance program. It is not intended to diagnose MRSA infection nor to guide or monitor treatment for MRSA infections. Performed at [facility], [place] [address], [place], [place] [address]   Culture, blood (routine x 2)     Status: Abnormal   Collection Time: 09/07/17  7:09 AM  Result Value Ref Range Status   Specimen Description BLOOD RIGHT HAND  Final   Special Requests AEROBIC BOTTLE ONLY Blood Culture adequate volume  Final   Culture  Setup Time   Final    GRAM POSITIVE COCCI AEROBIC BOTTLE ONLY CRITICAL VALUE NOTED.  VALUE IS CONSISTENT WITH PREVIOUSLY REPORTED AND CALLED VALUE.    Culture (A)  Final    STAPHYLOCOCCUS AUREUS SUSCEPTIBILITIES PERFORMED ON PREVIOUS CULTURE WITHIN THE LAST 5 DAYS. Performed at [facility], [place] [address] [place], [place] [address]    Report Status 09/09/2017 FINAL  Final  Culture, blood (routine x 2)     Status: None   Collection Time: 09/07/17  7:17 AM  Result Value Ref Range Status   Specimen Description BLOOD LEFT ANTECUBITAL  Final   Special Requests   Final    IN BOTH AEROBIC AND ANAEROBIC BOTTLES Blood Culture results may not be optimal due to an excessive volume of blood received in culture bottles   Culture   Final    NO GROWTH 5 DAYS Performed at [facility], [place] [address], [place], [place] [address]    Report Status 09/12/2017 FINAL  Final  Aerobic Culture (superficial specimen)     Status: None   Collection Time: 09/08/17  3:10 PM  Result Value Ref Range Status   Specimen Description WOUND RIGHT TOE  Final   Special Requests   Final     NONE Performed at [facility], [place] [address], [place], [place] [address]    Gram Stain   Final    RARE WBC PRESENT, PREDOMINANTLY MONONUCLEAR FEW GRAM POSITIVE COCCI ABUNDANT SQUAMOUS EPITHELIAL CELLS PRESENT    Culture ABUNDANT STAPHYLOCOCCUS AUREUS  Final   Report Status 09/10/2017 FINAL  Final   Organism ID, Bacteria STAPHYLOCOCCUS AUREUS  Final      Susceptibility   Staphylococcus aureus - MIC*    CIPROFLOXACIN <=0.5 SENSITIVE Sensitive     ERYTHROMYCIN <=0.25 SENSITIVE Sensitive     GENTAMICIN <=0.5 SENSITIVE Sensitive     OXACILLIN 0.5 SENSITIVE Sensitive     TETRACYCLINE <=1 SENSITIVE Sensitive     VANCOMYCIN <=0.5 SENSITIVE Sensitive     TRIMETH/SULFA <=10 SENSITIVE Sensitive     CLINDAMYCIN <=0.25 SENSITIVE Sensitive     RIFAMPIN <=0.5 SENSITIVE Sensitive     Inducible Clindamycin NEGATIVE Sensitive     * ABUNDANT STAPHYLOCOCCUS AUREUS  Body fluid culture     Status: None   Collection Time: 09/09/17  3:00 PM  Result Value Ref Range Status   Specimen Description SYNOVIAL LEFT WRIST  Final   Special Requests NONE  Final   Gram Stain   Final    ABUNDANT WBC PRESENT, PREDOMINANTLY PMN MODERATE GRAM POSITIVE COCCI Performed at [facility], [place] [address], [place], [place] [address]    Culture FEW STAPHYLOCOCCUS AUREUS  Final   Report Status 09/12/2017 FINAL  Final   Organism ID, Bacteria STAPHYLOCOCCUS AUREUS  Final      Susceptibility   Staphylococcus aureus - MIC*    CIPROFLOXACIN <=0.5 SENSITIVE Sensitive     ERYTHROMYCIN <=0.25 SENSITIVE Sensitive     GENTAMICIN <=0.5 SENSITIVE Sensitive     OXACILLIN 0.5 SENSITIVE Sensitive     TETRACYCLINE <=1 SENSITIVE Sensitive     VANCOMYCIN <=0.5 SENSITIVE Sensitive     TRIMETH/SULFA <=10 SENSITIVE Sensitive     CLINDAMYCIN <=0.25 SENSITIVE Sensitive     RIFAMPIN <=0.5 SENSITIVE Sensitive     Inducible Clindamycin NEGATIVE Sensitive     * FEW STAPHYLOCOCCUS AUREUS  Culture, blood (Routine X 2) w  Reflex to ID Panel     Status: Abnormal   Collection Time: 09/19/2017  9:25 AM  Result Value Ref Range Status   Specimen Description BLOOD LEFT FOREARM  Final   Special Requests   Final    BOTTLES DRAWN AEROBIC AND ANAEROBIC Blood Culture adequate volume   Culture  Setup Time   Final    GRAM POSITIVE COCCI AEROBIC BOTTLE ONLY CRITICAL RESULT CALLED TO, READ BACK BY AND VERIFIED WITH: A MEYER,PHARMD AT [facility] 09/12/2017 BY L BENFIELD Performed at [facility], [place] [address] [place], [place] [address]    Culture STAPHYLOCOCCUS AUREUS (A)  Final   Report Status 09/30/2017 FINAL  Final   Organism ID, Bacteria STAPHYLOCOCCUS AUREUS  Final      Susceptibility   Staphylococcus aureus - MIC*    CIPROFLOXACIN <=0.5 SENSITIVE Sensitive     ERYTHROMYCIN <=0.25 SENSITIVE Sensitive     GENTAMICIN <=0.5 SENSITIVE Sensitive     OXACILLIN 0.5 SENSITIVE Sensitive     TETRACYCLINE <=1 SENSITIVE Sensitive     VANCOMYCIN 1 SENSITIVE Sensitive     TRIMETH/SULFA <=10 SENSITIVE Sensitive     CLINDAMYCIN <=0.25 SENSITIVE Sensitive     RIFAMPIN <=0.5 SENSITIVE Sensitive     Inducible Clindamycin NEGATIVE Sensitive     * STAPHYLOCOCCUS AUREUS  Culture, blood (single)     Status: None (Preliminary result)   Collection Time: 09/26/2017  9:32 AM  Result Value Ref Range Status   Specimen Description BLOOD RIGHT HAND  Final   Special Requests   Final    BOTTLES DRAWN AEROBIC ONLY Blood Culture adequate volume   Culture   Final    NO GROWTH 4 DAYS Performed at [facility], [place] [address] [place], [place] [address]    Report Status PENDING  Incomplete  Aerobic/Anaerobic Culture (surgical/deep wound)     Status: None (Preliminary result)   Collection Time: 09/12/17  1:53 PM  Result Value Ref Range Status   Specimen Description ABSCESS  Final   Special Requests Normal  Final   Gram Stain   Final    ABUNDANT WBC PRESENT, PREDOMINANTLY PMN ABUNDANT GRAM POSITIVE COCCI IN CLUSTERS Performed at [facility], [place] [address], [place],  [address]    Culture   Final    FEW STAPHYLOCOCCUS AUREUS NO ANAEROBES ISOLATED; CULTURE IN PROGRESS FOR 5 DAYS  Report Status PENDING  Incomplete   Organism ID, Bacteria STAPHYLOCOCCUS AUREUS  Final      Susceptibility   Staphylococcus aureus - MIC*    CIPROFLOXACIN <=0.5 SENSITIVE Sensitive     ERYTHROMYCIN <=0.25 SENSITIVE Sensitive     GENTAMICIN <=0.5 SENSITIVE Sensitive     OXACILLIN 0.5 SENSITIVE Sensitive     TETRACYCLINE <=1 SENSITIVE Sensitive     VANCOMYCIN <=0.5 SENSITIVE Sensitive     TRIMETH/SULFA <=10 SENSITIVE Sensitive     CLINDAMYCIN <=0.25 SENSITIVE Sensitive     RIFAMPIN <=0.5 SENSITIVE Sensitive     Inducible Clindamycin NEGATIVE Sensitive     * FEW STAPHYLOCOCCUS AUREUS    Studies/Results: Dg Chest Port 1 View  Result Date: 09/19/2017 CLINICAL DATA:  Respiratory failure, intubated patient. History of asthma, recent debridement of the great toe and the wrists. EXAM: PORTABLE CHEST 1 VIEW COMPARISON:  Chest x-ray of September 15, 2017 FINDINGS: The lungs are reasonably well inflated. There small bilateral pleural effusions layering posteriorly. The interstitial markings are increased. The cardiac silhouette is enlarged and the pulmonary vascularity engorged. The endotracheal tube tip lies 5.1 cm above the carina. The esophagogastric tube tip in proximal port project below the inferior margin of the image. The dual-lumen dialysis catheter tip projects over the distal third of the SVC. IMPRESSION: Stable appearance of the chest since yesterday's study. Mild pulmonary edema is secondary to CHF. No focal infiltrates. Small bilateral pleural effusions. Electronically Signed   By: David  Martinique M.D.   On: 09/22/2017 06:56   Dg Chest Port 1 View  Result Date: 09/15/2017 CLINICAL DATA:  Endotracheally intubated EXAM: PORTABLE CHEST 1 VIEW COMPARISON:  09/26/2017 FINDINGS: Stable cardiomegaly. Stable support tubes and lines. BILATERAL  pulmonary opacities, with RIGHT greater than LEFT pleural effusions, representing stable diffuse BILATERAL airspace disease. IMPRESSION: Stable exam. No significant improvement. Differential remains pulmonary edema versus pneumonia. Electronically Signed   By: Staci Righter M.D.   On: 09/15/2017 07:17      Assessment/Plan:  INTERVAL HISTORY:   Patient is again requiring pressors to maintain her blood pressure.  She is scheduled to go to the operating room today but will get dialysis first Principal Problem:   MSSA bacteremia Active Problems:   Thalassemia   Rheumatoid arthritis ([place])   Morbid obesity ([place])   Acute back pain   Essential hypertension   Diabetes ([place])   Dyslipidemia   Severe sepsis with septic shock ([place])   Acute kidney injury ([place])   Immunosuppression due to drug therapy   Rheumatoid arthritis(714.0)   Septic joint of left wrist ([place])   Osteomyelitis of great toe of right foot ([place])   Hypertension   Hyperlipidemia   Acute hearing loss of left ear   Diabetes mellitus, insulin dependent (IDDM), controlled (HCC)   Beta thalassemia (HCC)   Symptomatic anemia   Elevated liver enzymes   Hyperbilirubinemia   Altered mental status, unspecified   Atrial flutter (HCC)   Infection of right hand   Abscess   Acute renal failure (HCC)   Acute respiratory failure (HCC)   Encephalopathy   Endotracheally intubated   Staphylococcal arthritis of right wrist ([place])   Facet joint disease   Pleural effusion    Jessica Howard is a 57 y.o. female with  RA  Admitted with MSSA B with septic shock, ARF, shock liver with evidence of endocarditis and "metastatic infection:"With severe infection of her right ankle and toe that will require a below the knee amputation extensive infection  in bilateral wrists left worse than right status post surgery by Dr. Amedeo Plenty, also with L2-L3 septic facet joint with pus collection that has been drained  #1 MSSAB with shock metastatic  disease:       [facility] Staphylococcus aureus bacteremia   Staphylococcus aureus bacteremia (SAB) is associated with a high rate of complications and mortality.  Specific aspects of clinical management are critical to optimizing the outcome of patients with SAB.  Therefore, the [facility] Antimicrobial Management Team [facility]) has initiated an intervention aimed at improving the management of SAB at [facility]  To do so, Infectious Diseases physicians are providing an evidence-based consult for the management of all patients with SAB.     Yes No Comments  Perform follow-up blood cultures (even if the patient is afebrile) to ensure clearance of bacteremia [x]  []  09/17/2017 NG but we need POST CENTRAL LINE REMOVAL blood cultures done to prove clearance with lines gone not sure that is going to be durab do able  in the near future but will see  Remove vascular catheter and obtain follow-up blood cultures after the removal of the catheter []  []  HD line holiday when possible If taken out today grab cultures afterwards so we can try to prove clearance.   Perform echocardiography to evaluate for endocarditis (transthoracic ECHO is 40-50% sensitive, TEE is > 90% sensitive) []  []  Please keep in mind, that neither test can definitively EXCLUDE endocarditis, and that should clinical suspicion remain high for endocarditis the patient should then still be treated with an "endocarditis" duration of therapy = 6 weeks  Thickened leaflets on the mitral valve but without clear-cut vegetation  Consult electrophysiologist to evaluate implanted cardiac device (pacemaker, ICD) []  []    Ensure source control []  []  Have all abscesses been drained effectively? Have deep seeded infections (septic joints or osteomyelitis) had appropriate surgical debridement?    She is going back to the operating room today for below the knee amputation on the right and potentially further surgeries in her  upper extremities  Investigate for "metastatic" sites of infection []  []  Does the patient have ANY symptom or physical exam finding that would suggest a deeper infection (back or neck pain that may be suggestive of vertebral osteomyelitis or epidural abscess, muscle pain that could be a symptom of pyomyositis)?  Keep in mind that for deep seeded infections MRI imaging with contrast is preferred rather than other often insensitive tests such as plain x-rays, especially early in a patient's presentation.  She has had aggressive surgery by Dr. Amedeo Plenty and Mardelle Matte and she has returned to the operating room today likely with below the knee amputation and further debridement of left wrist   Collection of pus near facet joint has been drained  She does have pleural effusions   Change antibiotic therapy to daptomycin []  []  Beta-lactam antibiotics are preferred for MSSA due to higher cure rates.   If on Vancomycin, goal trough should be 15 - 20 mcg/mL  Estimated duration of IV antibiotic therapy:  8 weeks []  []  Consult case management for probably prolonged outpatient IV antibiotic therapy   #2 Wrist infections: see above  #3 Left foot infection: will get TTA  #4 LBP: facet jiont infection  #5 Neuro: confusion: more likely due to multifactorial problems she did not have left sided endocarditis  #6 Pleural effusions: ? Raised re dapto and lungs.  See yesterday's note    LOS: 11 days   Alcide Evener 10/01/2017,  11:49 AM

## 2017-09-16 NOTE — Progress Notes (Signed)
Pharmacy Antibiotic Note  Jessica Howard is a 57 y.o. female admitted on 08/26/2017 with MSSA bacteremia.  Pharmacy has been consulted for daptomycin dosing. Pt now s/p OR x2 for I&D of septic joints. TEE in OR could not exclude endocarditis. Pt to return for further washout today and possible BKA. CK over the weekend was elevated but is now wnl. Pt off CRRT and likely to undergo iHD today per renal. Recent concern for septic emboli to lungs, per ID continue daptomycin for now.  Plan: -Continue daptomycin 8mg /kg q48h -Monitor CK closely  Height: 5\' 2"  (157.5 cm) Weight: (!) 301 lb 13 oz (136.9 kg) IBW/kg (Calculated) : 50.1  Temp (24hrs), Avg:100.2 F (37.9 C), Min:97.7 F (36.5 C), Max:102.7 F (39.3 C)  Recent Labs  Lab 09/09/17 1004 09/09/17 1241  09/12/17 0435  09/13/17 0439 09/13/17 1633 09/21/2017 0313 09/15/17 0408 09/15/17 1452 09/11/2017 0335 09/25/2017 0500  WBC  --   --    < > 22.5*  --  17.8*  --  15.2* 16.2*  --  15.4*  --   CREATININE  --   --    < >  --    < >  --  1.89* 1.65* 2.74* 3.14*  --  4.06*  LATICACIDVEN 1.5 1.2  --   --   --   --   --   --   --   --   --   --    < > = values in this interval not displayed.    Estimated Creatinine Clearance: 21 mL/min (A) (by C-G formula based on SCr of 4.06 mg/dL (H)).    Allergies  Allergen Reactions  . Bactrim [Sulfamethoxazole-Trimethoprim] Hives  . Cefuroxime Axetil Other (See Comments)    Blistering wounds per family  . Cephalosporins Other (See Comments)    Blistering wounds as an adult per family  . Lisinopril Cough  . Penicillins Hives  . Iohexol Itching and Rash     Code: RASH, Desc: HAD ITCHING AND A RASH ABOUT ONE HOUR AFTER RETURNING HOME FROM THE CT, Onset Date: [id number]   . Sulfa Antibiotics Hives    Antimicrobials this admission: 3/29 Vanc>>3/30 3/30 Aztreonam x 1  3/30 Dapto >>   Microbiology results: 4/5 Abscess cx >> few staph aureus 4/4 BCx: 1o2 S. aureus 4/2 Wrist Aspirate: GPCs 3/31  BCx: 1o2 S. aueus 3/29 BCx 4/4: MSSA  3/29 UCx 50k MSSA   Thank you for allowing pharmacy to be a part of this patient's care.  Arrie Senate, PharmD, BCPS PGY-2 Cardiology Pharmacy Resident Pager: [phone] 09/10/2017

## 2017-09-16 NOTE — Progress Notes (Signed)
PULMONARY  / CRITICAL CARE MEDICINE  Name: Jessica Howard MRN: [id number] DOB: 1961/04/16    LOS: 59  REFERRING MD :  Velvet Bathe MD  CHIEF COMPLAINT:  Back Pain   BRIEF PATIENT DESCRIPTION: Patient is a 57 y.o female with RA on immunosuppressive therapy (Levunomide and Abatacept) and DM who presented to the ED with 2 weeks of progressive myalgias and arthralgias. Initial work-up was significant for AKI, HAGMA, elevated liver enzymes, lactic acidosis, leukocytosis, and acute on chronic anemia. Blood cultures subsequently grew MSSA (4/4). Initial TTE was negative for features of endocarditis; however, patient has pathologic PE findings including splinter hemorrhages and Olser nodes. Her renal function worsened over the course of her hospitalization and she is requiring CRRT. The patient also converted to atrial fibrillation and was placed on amiodarone, diltiazem, and lopressor. MRI of the right foot illustrated septic arthritis of the right first IP with associated osteomyelitis. MRI of the lumbar spine showed septic arthritis of the right L1-2 facet joint with associated fluid collection. The patient went for I&D on 4/4 and we obtained a TEE that could not exclude endocarditis. She was successfully cardioverted at that time.  LINES / TUBES: Right peripheral IV  Right IJ tunneled HD catheter  Urinary catheter Penrose rain right hand  Wound VAC left hand  Endotracheal tube  NG tube   CULTURES: - 4/4 blood cultures on 3/29 for MSSA - Urine cultures on 3/29 growing MSSA - 1/2 Blood cultures on 3/30 with MSSA - Right toe superficial wound culture with staph aureus  - Left wrist synovial fluid with gram positive cocci  - Blood Cultures 4/4 with 1/2 positive  ANTIBIOTICS: - Vancomycin 3/29 -> discontinued on 3/30  - Aztreonam 3/29 -> discontinued on 3/30  - Flagyl 3/29 -> discontinued on 3/30  - Daptomycin 3/30 -> current  SIGNIFICANT EVENTS: Admitted for MSSA bacteremia 3/29  >>> Hypotensive and started on CRRT 3/31 >>> Converted to Atrial Fibrillation 4/1 >>> I&D of right wrist, right foot, and left wrist 4/4 >>> TEE with cardioversion 4/4 >>> Repeat blood cultures 1 of 2 positive on 4/4 >>> CRRT stopped 4/4 >>> Hypotension requiring pressors (neo) 4/4 >>> Left on Vent 4/4 >>> IR drainage of right L1-2 facet abscess 4/5 >>> CRRT restarted 4/5 >>> CRRT stopped 4/7 >>> I&D of right wrist, right foot, and left wrist. Amputation of right 1st ray and right second digit at DIP 4/7 >>> Hypotensive requiring pressor support with Neo 4/8 >>>  INTERVAL HISTORY:  Febrile again overnight (fever curve trending up overall). Hypotensive overnight. Started back on Neo.   Family at bedside. Addressed questions about positive blood cultures, when patient may be able to come off vent. Concerned about needing more surgeries.   VITAL SIGNS: Temp:  [99.6 F (37.6 C)-102.7 F (39.3 C)] 101.1 F (38.4 C) (04/09 0335) Pulse Rate:  [65-104] 65 (04/09 0645) Resp:  [0-32] 7 (04/09 0645) BP: (67-126)/(38-103) 100/55 (04/09 0645) SpO2:  [95 %-100 %] 100 % (04/09 0645) FiO2 (%):  [40 %] 40 % (04/09 0401) Weight:  [301 lb 13 oz (136.9 kg)] 301 lb 13 oz (136.9 kg) (04/09 0352)  INTAKE / OUTPUT: Intake/Output      04/08 0701 - 04/09 0700 04/09 0701 - 04/10 0700   I.V. (mL/kg) 3056.3 (22.3)    NG/GT 308.7    Total Intake(mL/kg) 3365 (24.6)    Urine (mL/kg/hr) 80 (0)    Drains     Blood     Total Output  80    Net +3285           PHYSICAL EXAMINATION:  General: Obese female, sedated  Neuro: Sedated  HEENT: Normocephalic, atraumatic, moist mucus membranes Cardiovascular: RRR, no murmurs, no rubs Lungs: Good air movement with no wheezing or crackles  Abdomen: Active bowel sounds, soft, no tenderness to palpation  Musculoskeletal: Mild LE edema  Skin: Warm and dry   LABS: Cbc Recent Labs  Lab 09/22/2017 0313 09/15/17 0408 10/07/2017 0335  WBC 15.2* 16.2* 15.4*  HGB  6.8* 7.0* 7.9*  HCT 20.7* 20.7* 25.5*  PLT 197 153 178   Chemistry Recent Labs  Lab 10/02/2017 0313 09/15/17 0408 09/15/17 1452 09/22/2017 0335 10/04/2017 0500  NA 133* 132* 132*  --  131*  K 4.1 4.5 4.7  --  5.7*  CL 99* 100* 99*  --  101  CO2 23 18* 21*  --  19*  BUN 18 29* 32*  --  42*  CREATININE 1.65* 2.74* 3.14*  --  4.06*  CALCIUM 7.8* 7.5* 7.7*  --  7.5*  MG 2.4 2.3  --  2.3  --   PHOS 3.4  3.4 3.7  --   --  7.0*  GLUCOSE 130* 117* 108*  --  112*   Liver fxn Recent Labs  Lab 10/04/2017 0409  09/12/17 0643  10/02/2017 0313 09/15/17 0408 10/06/2017 0500  AST 67*  --  49*  --   --  55*  --   ALT 28  --  24  --   --  22  --   ALKPHOS 527*  --  417*  --   --  519*  --   BILITOT 5.5*  --  3.8*  --   --  4.1*  --   PROT 6.1*  --  5.8*  --   --  5.7*  --   ALBUMIN 1.4*   < > 1.6*  1.6*   < > 1.6* 1.4*  1.5* 1.4*   < > = values in this interval not displayed.   coags Recent Labs  Lab 09/09/17 1004 09/12/17 0435  INR 1.69 1.52   Sepsis markers Recent Labs  Lab 09/09/17 1004 09/09/17 1241  LATICACIDVEN 1.5 1.2   Cardiac markers Recent Labs  Lab 09/10/17 0434 09/13/17 0439 09/15/17 0408  CKTOTAL 131 441* 102   BNP No results for input(s): PROBNP in the last 168 hours.   ABG Recent Labs  Lab 09/12/17 1037 09/10/2017 0607 09/15/17 0715  HCO3 22.6 25.1 22.0  TCO2 _0 CBG trend Recent Labs  Lab 09/15/17 1210 09/15/17 1600 09/15/17 1959 09/15/17 2331 09/29/2017 0338  GLUCAP 116* 101* 102* 60 91   DIAGNOSES: Principal Problem:   MSSA bacteremia Active Problems:   Thalassemia   Rheumatoid arthritis ([place])   Morbid obesity (HCC)   Acute back pain   Essential hypertension   Diabetes ([place])   Dyslipidemia   Severe sepsis with septic shock ([place])   Acute kidney injury ([place])   Immunosuppression due to drug therapy   Rheumatoid arthritis(714.0)   Septic joint of left wrist ([place])   Osteomyelitis of great toe of right foot ([place])   Hypertension    Hyperlipidemia   Acute hearing loss of left ear   Diabetes mellitus, insulin dependent (IDDM), controlled (HCC)   Beta thalassemia (HCC)   Symptomatic anemia   Elevated liver enzymes   Hyperbilirubinemia   Altered mental status, unspecified   Atrial flutter ([place])   Infection  of right hand   Abscess   Acute renal failure (HCC)   Acute respiratory failure (HCC)   Encephalopathy   Endotracheally intubated   Staphylococcal arthritis of right wrist (HCC)   Facet joint disease   Pleural effusion  ASSESSMENT / PLAN: 57 y.o female with RA on immunosuppressive therapy (Levunomide and Abatacept) who presented to the ED on 3/29 with sepsis and multiorgan failure secondary to MSSA Bacteremia/Endocaridits.   INFECTIOUS A: Sepsis secondary to MSSA Bacteremia/Endocarditis  MSSA septic arthritis, spinal abscess Febrile overnight and had to restart pressors.  P:   Infectious disease onboard, continuing daptomycin Orthopedic surgery onboard. Plan for OR today.  Wean Neo to maintain MAP >65. Blood cultures from 4/4 with 1/2 positive. Will need to repeat.  Needs line holiday.   CARDIOVASCULAR A:  Hypotension Patient is hypertensive at baseline on bystolic, irbesartan, and spironolactone.  Atrial Fib 2/2 sepsis  TEE without evidence of large vegetation or thrombus, cardioverted on 4/4.  On amiodarone   P:  Cardiology signed off 4/8.  Currently in sinus rhythm. Stopped heparin as patient will go to OR today.  Continue Amiodarone.  RENAL A: ARF secondary to MSSA bacteremia, blood clots in catheter  Creatine elevated from baseline of 0.9 to 3.36 on admission  UA with hematuria, proteinuria, and bacteruria. Cultures growing Staph aureus.  Mg 2.3 PO4 7.0 K 5.7  P:   Nephrology onboard. Going for HD this AM. Will calcium gluconate and check EKG in meantime. Patient remains oligouric. Maintain MAP >65 Needs line holiday.   PULMONARY A: Post surgical ventilation  Obstructive  Sleep Apnea  Vent Mode: PRVC FiO2 (%):  [40 %] 40 % Set Rate:  [18 bmp] 18 bmp Vt Set:  [470 mL] 470 mL PEEP:  [5 cmH20] 5 cmH20 Plateau Pressure:  [21 cmH20-26 cmH20] 24 cmH20  P:   RASS: -2/-3 Current sedation: Fentanyl  VAP precautions CXR illustrating stable endotracheal tube and stable vascular congestion  Repeat VBG  GASTROINTESTINAL A: Elevated LFTs ? Cholestatic liver injury  Presented with gross elevation in alk phos and direct bilirubin with minor elevation in LFTs and indirect bilirubin.  Abdominal ultrasound unremarkable for acute etiology with no pericholecystic fluid, wall thickening, gallstones, or ductal dilation.  CT abdomen and pelvis yesterday with normal liver, intra-/extrahepatic biliary duct, and gallbladder.  Ammonium elevated to 44, INR 1.65  P:   Remain elevated.  Will continue to monitor.   HEMATOLOGIC A:  Beta Thalassemia  Acute on chronic anemia secondary to sepsis Hgb dropped from baseline of ~10 to 7.6 on admission.  LDH elevated but haptoglobin elevated and reticulocyte WNL therefore, unlikely to be 2/2 hemolytic anemia.   P:  Hgb 7.9 this AM Transfusion goals: Transfuse 1 units pRBCs if <7 or <8 if patient becomes hemodynamically unstable and/or has active bleeding   ENDOCRINE A: Insulin Dependent Type 2 DM On Humolin 75 units TID at home   P:   CBG goal 140-180  Continue SSI-moderate   NEUROLOGICAL A: Altered Mental Status  Multiple contributing factors including A-fib, sepsis, opiates CT head and ammonium WNL  P:  Wean sedation after surgery.  Pulmonary and [place] Pager: [phone]  09/08/2017, 7:48 AM

## 2017-09-16 NOTE — Procedures (Signed)
Patient seen on Hemodialysis. QB 400, UF goal 0.5L Treatment adjusted as needed.  Elmarie Shiley MD [facility] Office # [phone] Pager # [phone] 12:20 PM

## 2017-09-16 NOTE — Op Note (Signed)
09/28/2017  6:54 PM  PATIENT:  Jessica Howard    PRE-OPERATIVE DIAGNOSIS:     1.  Right ankle tibiotalar sepsis with infection tracking up the peroneal sheaths. 2.  Right index finger infection, status post guillotine amputation of the distal phalanx 3.  Right thumb infection of the subcutaneous tissue and extending into the webspace. 4.  Left wrist carpal osteomyelitis with infection of the distal radial ulnar joint, midcarpal joint, and radiocarpal joint.  POST-OPERATIVE DIAGNOSIS:   1.  Right ankle tibiotalar sepsis with infection tracking up the peroneal sheaths. 2.  Right index finger infection, status post guillotine amputation of the distal phalanx 3.  Right thumb infection of the subcutaneous tissue and extending into the webspace. 4.  Left wrist carpal osteomyelitis with infection of the distal radial ulnar joint, midcarpal joint, and radiocarpal joint. 5.  Left septic olecranon bursitis 6.  Right Achilles bursal seroma fluid accumulation, no evidence for infection.  PROCEDURE:    1.  Right below-knee amputation 2.  Right index finger irrigation, debridement, skin, subcutaneous tissue, cartilage, bone, with delayed primary closure of the fingertip 3.  Right thumb excisional debridement, skin, subcutaneous tissue, fascia, 4.  Needle aspiration of left olecranon bursa yielding substantial purulence 5.  Olecranon bursectomy, left elbow 6.  Incision, irrigation, debridement, skin, subcutaneous tissue, and fascia, and periosteum overlying bone,  left elbow olecranon 7.  Incision, exploratory fasciotomy of the extensor compartment of the forearm, no evidence of purulence. 8.  Arthrotomy with irrigation and debridement, distal radial ulnar joint, proximal radiocarpal joint, midcarpal joint, and carpal metacarpal joint 9.  Extensive tenosynovectomy, dorsal compartment 2 through 6 10.  Irrigation, debridement, skin, subcutaneous tissue, tendon, bone, left distal forearm and  carpus 11.  Needle aspiration right Achilles bursa, no evidence for infection  SURGEON:  Johnny Bridge, MD   First assistant: Roseanne Kaufman, MD, present and scrubbed during the left upper extremity portion of the operation  PHYSICIAN ASSISTANT: Joya Gaskins, OPA-C, present and scrubbed throughout the case, critical for completion in a timely fashion, and for retraction, instrumentation, and closure.  ANESTHESIA:   General  PREOPERATIVE INDICATIONS:  Jessica Howard is a  57 y.o. female who has complicated septic bacteremia with seeding of multiple joints and her family elected for her to undergo revision surgery with the above named procedures.  The risks benefits and alternatives were discussed with the patient and the family preoperatively including but not limited to the risks of infection, bleeding, nerve injury, cardiopulmonary complications, the need for revision surgery, among others, and the patient was willing to proceed.  ESTIMATED BLOOD LOSS:   250 mL of blood loss on the below-knee amputation. 50 mL of blood loss on the right upper extremity operation 75 mL of blood loss on the left upper extremity operation  Blood products: 2 units of packed red blood cells  OPERATIVE IMPLANTS: Penrose drain x3 for the left carpus at the radiocarpal and midcarpal joint  OPERATIVE FINDINGS:   I had a clean bed of tissue at the site of the below-knee amputation, no evidence for infection in that location, it appeared to all be distal.  The index finger on the right side appeared clean, and adequate for closure.  There was a small amount of additional necrotic tissue over the thumb at the skin edge, but overall that wound looks relatively clean.  The left upper extremity had a significant amount of fluid accumulation at the olecranon bursa, and aspiration demonstrated substantial purulence.  I  was concerned because there was soft tissue swelling in the proximal forearm, but it did not  appear that there was purulence in that location.  There was substantial abscess over the olecranon bursa however.  I did not feel that it tracked into the joint.  The left wrist and carpus still had extreme necrosis at the distal ulna, as well as within the carpal bones, and purulence still present within the joint spaces.  There was a soft tissue fluid accumulation over the right Achilles bursa, however aspiration yielded the appearance of a seroma, it did not look like infection.  OPERATIVE PROCEDURE: The patient was brought to the operating room and placed in the supine position.  She was already intubated, and the right lower extremity was prepped and draped in usual sterile fashion and the also prepped the right upper extremity.  Timeout was performed.  The right lower extremity was elevated, and a tourniquet inflated.  Skin flaps were drawn using a posterior skin flap technique, and I made incision through the skin, subcutaneous tissue, down through the muscle, and exposed the distal tibia.  The tibia was cut with an oscillating saw.  I beveled the anterior edge.  I cut the fibula somewhat more proximal, and then exposed the neurovascular structures.  I cut the tibial nerve as high up as possible.  I clamped the tibial artery, along with branches of the peroneal artery, the saphenous vein, and these were all tied off using silk ties.  I released the tourniquet, total tourniquet time was approximately 30 minutes.  I had excellent hemostasis.  I debulked the gastroc musculature to provide an appropriate stump, and then repaired the fascia of the gastrocnemius and soleus complex delivering anteriorly to the periosteum and anterior fascia.  I repaired the subcutaneous tissue and repaired the skin flaps with nylon suture.  I trimmed the skin in order to minimize dog ear formation.  I dressed the right lower extremity with sterile gauze.  A compression sock was also applied.  I went to the right  upper extremity.  The index finger was debrided using a curette as well as a Soil scientist, after complete debridement I trimmed some of the skin edges with a scissors, and then repaired the tip with nylon suture.  I used a scissors to excise a small portion of necrotic skin overlying the thumb, and used a freer elevator and a curette to debride the deep tissue in the subcutaneous area as well as down towards into the webspace.  The thumb and index finger was dressed with sterile gauze, and a bulky dressing.  We then broke down the sterile field, turned the table, and I examined the left upper extremity.  I was concerned because there was significant fluid accumulation over the olecranon bursa.  I prepared the olecranon bursa with chlorhexidine, and then used an 18-gauge needle to aspirate the fluid.  This fluid was sent for Gram stain culture and sensitivity, not because I did not know that she had an infection, but rather just to make sure that we were still dealing with the same organism.  This yielded substantial purulence.  The left upper extremity was then prepped and draped in usual sterile fashion.  Timeout was performed.  Dorsal lateral incision was made over the olecranon process and a complete olecranon bursectomy was carried out using a knife.  A rongure and a curette and a freer elevator was also used to debride the tissue.  I explored ulnarly, it appeared to  be tracking towards the fascia on the ulnar side, I made an incision over the volar fascia and examined the musculature which appeared viable without infection in the volar compartment.  There was substantial soft tissue fluid accumulation in the dorsal compartment, and the proximal forearm, so I made a 1 inch incision in that location, and then explored the dorsal compartment which did not yield significant purulence.  I did perform a fasciotomy during the exposure.  I then went to the distal carpus, and debrided the tissue of the  extensor tendons, exposed the DRUJ and the radiocarpal joint and the midcarpal joint, this time I also exposed the carpal metacarpal joint on the ulnar side.  The distal ulna appeared somewhat necrotic, as did some of the carpal bones.  It is not clear how much bone will be able to be salvaged.  I debrided all of these areas with a rongure, a curette, and a sponge.  I performed the extensor tenosynovectomy with a scissors over the extensor tendons.  I irrigated a total of 9 L of fluid through the carpus and distal forearm, and 3 L through the olecranon bursa.  Penrose drains were placed in the carpus and mid carpal spaces, and a bulky dressing with a splint was applied, and sterile gauze also applied to the olecranon bursa.  During the splinting process, I also performed an additional secondary survey of the rest of her extremities, and her right Achilles had substantial fluid accumulation posteriorly.  I prepared this with chlorhexidine and aspirated the fluid accumulation which appeared to be a seroma, no significant purulence.  This was dressed with a Xeroform dressing and a bulky padded gauze.  She then returned to the ICU in stable condition.  There were no complications, however the prognosis is overall extremely poor, I discussed this with the family, concerned about her survivability of the entire episode much less the function of her left upper extremity.

## 2017-09-16 NOTE — Transfer of Care (Signed)
Immediate Anesthesia Transfer of Care Note  Patient: Jessica Howard  Procedure(s) Performed: RIGHT AMPUTATION BELOW KNEE (Right Leg Lower) INCISION AND DRAINAGE OF RIGHT THUMB AND LEFT WRIST (Bilateral Hand)  Patient Location: PACU and ICU  Anesthesia Type:General  Level of Consciousness: sedated, unresponsive and Patient remains intubated per anesthesia plan  Airway & Oxygen Therapy: Patient remains intubated per anesthesia plan  Post-op Assessment: Report given to RN and Post -op Vital signs reviewed and stable  Post vital signs: Reviewed  Last Vitals: 126/64, 84, vent, 100% Vitals Value Taken Time  BP 138/58 09/12/2017  6:45 PM  Temp    Pulse 64 10/02/2017  6:46 PM  Resp 18 09/15/2017  6:46 PM  SpO2 100 % 09/27/2017  6:46 PM  Vitals shown include unvalidated device data.  Last Pain:  Vitals:   09/19/2017 1400  TempSrc: Oral  PainSc:       Patients Stated Pain Goal: 0 (27/06/23 7628)  Complications: No apparent anesthesia complications

## 2017-09-16 NOTE — Patient Outreach (Signed)
[facility]) Care Management  09/26/2017  Jessica Howard 1961-05-24 [id number]   57 y.o. year old female referred to [facility] for Medication Assistance ([place] f/u #3)   Was unable to reach patient via telephone today and have left HIPAA compliant voicemail asking patient to return my call (unsuccessful outreach #3). It does appear that patient is in hospital at this time. Will follow up within 1 week to outreach to patient after discharge at least once more. Then, will close case if unable to reach her.   Carlean Jews, Pharm.D., BCPS PGY2 Ambulatory Care Pharmacy Resident Phone: [phone]

## 2017-09-16 NOTE — Progress Notes (Signed)
Patient ID: Jessica Howard, female   DOB: [birth year], 57 y.o.   MRN: [id number] [facility] Progress Note   Assessment/ Plan:    1. Acute kidney injury: Anuric overnight with 80 cc urine output-suspected ATN in setting of sepsis and hypotension, however pt with evidence of disseminated MSSA septicemia with osler's node and splinter hemorrhages. Started on CVVHD 09/07/17 which appears to have been stopped on 10/04/2017.  Plan for 3hr IHD today for potassium lowering and then re-evaluate for possible transition to CRRT again.  2. MSSA septicemia with diffuseseeding of joints/spine, osteo of right great toe. ID following and TEEwith possible MV endocarditis but no vegetations seen.On Daptomycinper ID. Febrile again and to undergo I&D of hands and possible right BKA today. 3. Atrial fibrillation currently sinus rhythm and rate controlled on amiodarone s/p DCCV. 4. Abnormal LFT's-possibly shock liver associated with bacteremia/sepsis.  Hypoalbuminemia likely from synthetic defect versus acute phase reactant. 5. Anemia of acute illness- follow and transfuse prn 6. Vascular access- RIJ catheter placed 3/31/19and may need catheter holiday. 7. Rheumatoid arthritis  8. Thalassemia  Subjective:   Febrile overnight and back on neosynephrine for blood pressure support. Plans for washout/ I&D of hand wounds and possible right BKA.   Objective:   BP (!) 100/55   Pulse 65   Temp (!) 101.1 F (38.4 C) (Core)   Resp (!) 7   Ht _0  (1.575 m)   Wt (!) 136.9 kg (301 lb 13 oz)   LMP 12/27/2013 Comment: irreggular  SpO2 100%   BMI 55.20 kg/m   Intake/Output Summary (Last 24 hours) at 10/07/2017 0723 Last data filed at 09/24/2017 0600 Gross per 24 hour  Intake 3365.01 ml  Output 80 ml  Net 3285.01 ml   Weight change: 7 kg (15 lb 6.9 oz)  Physical Exam: Gen: Intubated, sedated CVS: Pulse regular rhythm, normal rate, S1 and S2 normal Resp: Mechanical breath sounds bilaterally,  no distinct rales or rhonchi Abd: Soft, obese, nontender Ext: Left arm with dressing/wound VAC, right arm in dressing, right leg dressing noted.  1-2+ edema over ankles  Imaging: Dg Chest Port 1 View  Result Date: 09/19/2017 CLINICAL DATA:  Respiratory failure, intubated patient. History of asthma, recent debridement of the great toe and the wrists. EXAM: PORTABLE CHEST 1 VIEW COMPARISON:  Chest x-ray of September 15, 2017 FINDINGS: The lungs are reasonably well inflated. There small bilateral pleural effusions layering posteriorly. The interstitial markings are increased. The cardiac silhouette is enlarged and the pulmonary vascularity engorged. The endotracheal tube tip lies 5.1 cm above the carina. The esophagogastric tube tip in proximal port project below the inferior margin of the image. The dual-lumen dialysis catheter tip projects over the distal third of the SVC. IMPRESSION: Stable appearance of the chest since yesterday's study. Mild pulmonary edema is secondary to CHF. No focal infiltrates. Small bilateral pleural effusions. Electronically Signed   By: David  Martinique M.D.   On: 10/03/2017 06:56   Dg Chest Port 1 View  Result Date: 09/15/2017 CLINICAL DATA:  Endotracheally intubated EXAM: PORTABLE CHEST 1 VIEW COMPARISON:  09/27/2017 FINDINGS: Stable cardiomegaly. Stable support tubes and lines. BILATERAL pulmonary opacities, with RIGHT greater than LEFT pleural effusions, representing stable diffuse BILATERAL airspace disease. IMPRESSION: Stable exam. No significant improvement. Differential remains pulmonary edema versus pneumonia. Electronically Signed   By: Staci Righter M.D.   On: 09/15/2017 07:17    Labs: BMET Recent Labs  Lab 09/12/17 1324 09/12/17 1600 09/13/17 4010 09/13/17 1633 09/22/2017 2725  09/15/17 0408 09/15/17 1452 09/25/2017 0500  NA 134* 136 135 134* 133* 132* 132* 131*  K 4.1 3.7 3.6 3.9 4.1 4.5 4.7 5.7*  CL 102 103 100* 100* 99* 100* 99* 101  CO2 20* _0 18* 21*  19*  GLUCOSE 175* 159* 142* 157* 130* 117* 108* 112*  BUN 40* 42* 31* 23* 18 29* 32* 42*  CREATININE 3.02* 3.12* 2.38* 1.89* 1.65* 2.74* 3.14* 4.06*  CALCIUM 7.9* 8.1* 8.0* 7.9* 7.8* 7.5* 7.7* 7.5*  PHOS 5.1* 4.3 3.4 3.3 3.4  3.4 3.7  --  7.0*   CBC Recent Labs  Lab 09/13/17 0439 10/01/2017 0313 09/15/17 0408 09/17/2017 0335  WBC 17.8* 15.2* 16.2* 15.4*  HGB 7.3* 6.8* 7.0* 7.9*  HCT 21.8* 20.7* 20.7* 25.5*  MCV 68.3* 69.0* 72.1* 75.9*  PLT 238 197 153 178    Medications:    . amiodarone  200 mg Oral BID  . chlorhexidine gluconate (MEDLINE KIT)  15 mL Mouth Rinse BID  . Chlorhexidine Gluconate Cloth  6 each Topical Daily  . docusate  100 mg Per Tube BID  . feeding supplement (PRO-STAT SUGAR FREE 64)  60 mL Per Tube TID  . feeding supplement (VITAL HIGH PROTEIN)  1,000 mL Per Tube Q24H  . insulin aspart  0-15 Units Subcutaneous Q4H  . lactulose  10 g Oral Daily  . mouth rinse  15 mL Mouth Rinse 10 times per day  . pantoprazole (PROTONIX) IV  40 mg Intravenous Q24H  . sennosides  5 mL Per Tube Daily  . sodium chloride flush  3 mL Intravenous Q12H   Elmarie Shiley, MD 09/27/2017, 7:23 AM

## 2017-09-16 NOTE — Progress Notes (Signed)
Patient finished dialysis, plan for I&D both upper extremities and BKA right lower extremity.   I have discussed the risks benefits and alternatives with the family, they have consented to proceed.

## 2017-09-17 ENCOUNTER — Encounter (HOSPITAL_COMMUNITY): Payer: Self-pay | Admitting: Orthopedic Surgery

## 2017-09-17 ENCOUNTER — Inpatient Hospital Stay (HOSPITAL_COMMUNITY): Payer: Medicare Other

## 2017-09-17 DIAGNOSIS — M7022 Olecranon bursitis, left elbow: Secondary | ICD-10-CM

## 2017-09-17 DIAGNOSIS — Z9889 Other specified postprocedural states: Secondary | ICD-10-CM

## 2017-09-17 DIAGNOSIS — Z7189 Other specified counseling: Secondary | ICD-10-CM

## 2017-09-17 DIAGNOSIS — Z89511 Acquired absence of right leg below knee: Secondary | ICD-10-CM

## 2017-09-17 DIAGNOSIS — I059 Rheumatic mitral valve disease, unspecified: Secondary | ICD-10-CM

## 2017-09-17 DIAGNOSIS — Z89422 Acquired absence of other left toe(s): Secondary | ICD-10-CM

## 2017-09-17 DIAGNOSIS — M25532 Pain in left wrist: Secondary | ICD-10-CM

## 2017-09-17 LAB — RENAL FUNCTION PANEL
Albumin: 1.3 g/dL — ABNORMAL LOW (ref 3.5–5.0)
Anion gap: 11 (ref 5–15)
BUN: 36 mg/dL — ABNORMAL HIGH (ref 6–20)
CO2: 23 mmol/L (ref 22–32)
Calcium: 7.5 mg/dL — ABNORMAL LOW (ref 8.9–10.3)
Chloride: 99 mmol/L — ABNORMAL LOW (ref 101–111)
Creatinine, Ser: 3.13 mg/dL — ABNORMAL HIGH (ref 0.44–1.00)
GFR calc Af Amer: 18 mL/min — ABNORMAL LOW (ref >60)
GFR calc non Af Amer: 16 mL/min — ABNORMAL LOW (ref >60)
Glucose, Bld: 118 mg/dL — ABNORMAL HIGH (ref 65–99)
Phosphorus: 6.6 mg/dL — ABNORMAL HIGH (ref 2.5–4.6)
Potassium: 5.1 mmol/L (ref 3.5–5.1)
Sodium: 133 mmol/L — ABNORMAL LOW (ref 135–145)

## 2017-09-17 LAB — TYPE AND SCREEN
ABO/RH(D): A POS
ANTIBODY SCREEN: NEGATIVE
DONOR AG TYPE: NEGATIVE
DONOR AG TYPE: NEGATIVE
Donor AG Type: NEGATIVE
Donor AG Type: NEGATIVE
Donor AG Type: NEGATIVE
Donor AG Type: NEGATIVE
Donor AG Type: NEGATIVE
Donor AG Type: NEGATIVE
Donor AG Type: NEGATIVE
UNIT DIVISION: 0
UNIT DIVISION: 0
UNIT DIVISION: 0
UNIT DIVISION: 0
UNIT DIVISION: 0
Unit division: 0
Unit division: 0
Unit division: 0
Unit division: 0
Unit division: 0

## 2017-09-17 LAB — HEPATIC FUNCTION PANEL
ALK PHOS: 409 U/L — AB (ref 38–126)
ALT: 24 U/L (ref 14–54)
AST: 52 U/L — ABNORMAL HIGH (ref 15–41)
Albumin: 1.3 g/dL — ABNORMAL LOW (ref 3.5–5.0)
BILIRUBIN DIRECT: 3.7 mg/dL — AB (ref 0.1–0.5)
Indirect Bilirubin: 1.3 mg/dL — ABNORMAL HIGH (ref 0.3–0.9)
Total Bilirubin: 5 mg/dL — ABNORMAL HIGH (ref 0.3–1.2)
Total Protein: 5.6 g/dL — ABNORMAL LOW (ref 6.5–8.1)

## 2017-09-17 LAB — AEROBIC/ANAEROBIC CULTURE (SURGICAL/DEEP WOUND)

## 2017-09-17 LAB — BPAM RBC
BLOOD PRODUCT EXPIRATION DATE: 201904252359
BLOOD PRODUCT EXPIRATION DATE: 201904282359
BLOOD PRODUCT EXPIRATION DATE: 201905012359
BLOOD PRODUCT EXPIRATION DATE: 201905012359
BLOOD PRODUCT EXPIRATION DATE: 201905022359
Blood Product Expiration Date: 201904232359
Blood Product Expiration Date: 201904242359
Blood Product Expiration Date: 201904262359
Blood Product Expiration Date: 201905022359
Blood Product Expiration Date: 201905022359
ISSUE DATE / TIME: 201904070354
ISSUE DATE / TIME: 201904091416
ISSUE DATE / TIME: 201904091750
ISSUE DATE / TIME: 201904091416
ISSUE DATE / TIME: 201904091750
UNIT TYPE AND RH: 5100
UNIT TYPE AND RH: 6200
UNIT TYPE AND RH: 6200
UNIT TYPE AND RH: 6200
Unit Type and Rh: 5100
Unit Type and Rh: 5100
Unit Type and Rh: 6200
Unit Type and Rh: 6200
Unit Type and Rh: 6200
Unit Type and Rh: 6200

## 2017-09-17 LAB — GLUCOSE, CAPILLARY
GLUCOSE-CAPILLARY: 102 mg/dL — AB (ref 65–99)
GLUCOSE-CAPILLARY: 113 mg/dL — AB (ref 65–99)
GLUCOSE-CAPILLARY: 84 mg/dL (ref 65–99)
Glucose-Capillary: 109 mg/dL — ABNORMAL HIGH (ref 65–99)
Glucose-Capillary: 105 mg/dL — ABNORMAL HIGH (ref 65–99)
Glucose-Capillary: 105 mg/dL — ABNORMAL HIGH (ref 65–99)

## 2017-09-17 LAB — AEROBIC/ANAEROBIC CULTURE W GRAM STAIN (SURGICAL/DEEP WOUND): Special Requests: NORMAL

## 2017-09-17 LAB — CBC
HEMATOCRIT: 27.3 % — AB (ref 36.0–46.0)
HEMOGLOBIN: 8.8 g/dL — AB (ref 12.0–15.0)
MCH: 25.7 pg — ABNORMAL LOW (ref 26.0–34.0)
MCHC: 32.2 g/dL (ref 30.0–36.0)
MCV: 79.8 fL (ref 78.0–100.0)
Platelets: 160 10*3/uL (ref 150–400)
RBC: 3.42 MIL/uL — ABNORMAL LOW (ref 3.87–5.11)
RDW: 19.4 % — AB (ref 11.5–15.5)
WBC: 14.2 10*3/uL — AB (ref 4.0–10.5)

## 2017-09-17 LAB — HEPATITIS B SURFACE ANTIBODY,QUALITATIVE: Hep B S Ab: NONREACTIVE

## 2017-09-17 LAB — SURGICAL PCR SCREEN
MRSA, PCR: NEGATIVE
STAPHYLOCOCCUS AUREUS: POSITIVE — AB

## 2017-09-17 LAB — CK: CK TOTAL: 439 U/L — AB (ref 38–234)

## 2017-09-17 LAB — HEPARIN LEVEL (UNFRACTIONATED): Heparin Unfractionated: 0.5 IU/mL (ref 0.30–0.70)

## 2017-09-17 LAB — MAGNESIUM: MAGNESIUM: 1.9 mg/dL (ref 1.7–2.4)

## 2017-09-17 LAB — TRIGLYCERIDES: Triglycerides: 403 mg/dL — ABNORMAL HIGH (ref <150)

## 2017-09-17 LAB — HEPATITIS B SURFACE ANTIGEN: HEP B S AG: NEGATIVE

## 2017-09-17 MED ORDER — MAGNESIUM SULFATE IN D5W 1-5 GM/100ML-% IV SOLN
1.0000 g | Freq: Once | INTRAVENOUS | Status: AC
  Administered 2017-09-17: 1 g via INTRAVENOUS
  Filled 2017-09-17: qty 100

## 2017-09-17 MED ORDER — HEPARIN (PORCINE) IN NACL 100-0.45 UNIT/ML-% IJ SOLN
2050.0000 [IU]/h | INTRAMUSCULAR | Status: AC
  Administered 2017-09-17 (×2): 2050 [IU]/h via INTRAVENOUS
  Filled 2017-09-17 (×4): qty 250

## 2017-09-17 MED ORDER — MUPIROCIN 2 % EX OINT
1.0000 "application " | TOPICAL_OINTMENT | Freq: Two times a day (BID) | CUTANEOUS | Status: AC
  Administered 2017-09-17 – 2017-09-19 (×4): 1 via NASAL
  Filled 2017-09-17: qty 22

## 2017-09-17 NOTE — Progress Notes (Signed)
Pt's son at bedside throughout most of day. He spoke to CCM, ortho, and ID. Specifically, he spoke to Dr Mardelle Matte regarding 4/11 surgery.   I have consent to son [facility]) to sign, and he refused. He said he'd like to speak to the surgeon in person again to give 'some suggestions' about the surgical procedure.   As of the time of this note, the surgery has not been posted to the OR schedule. Will relay to night shift, as I cannot contact the surgeon, as we do not know at this time who is performing.   Lorre Munroe

## 2017-09-17 NOTE — Progress Notes (Signed)
PULMONARY  / CRITICAL CARE MEDICINE  Name: Jessica Howard MRN: [id number] DOB: 08-16-1960    LOS: 40  REFERRING MD :  Velvet Bathe MD  CHIEF COMPLAINT:  Back Pain   BRIEF PATIENT DESCRIPTION: Patient is a 57 y.o female with RA on immunosuppressive therapy (Levunomide and Abatacept) and DM who presented to the ED with 2 weeks of progressive myalgias and arthralgias. Initial work-up was significant for AKI, HAGMA, elevated liver enzymes, lactic acidosis, leukocytosis, and acute on chronic anemia. Blood cultures subsequently grew MSSA (4/4). Initial TTE was negative for features of endocarditis; however, patient has pathologic PE findings including splinter hemorrhages and Olser nodes. Her renal function worsened over the course of her hospitalization and she is requiring CRRT. The patient also converted to atrial fibrillation and was placed on amiodarone, diltiazem, and lopressor. MRI of the right foot illustrated septic arthritis of the right first IP with associated osteomyelitis. MRI of the lumbar spine showed septic arthritis of the right L1-2 facet joint with associated fluid collection. The patient went for I&D on 4/4 and we obtained a TEE that could not exclude endocarditis. She was successfully cardioverted at that time. Since has gone back for another I&D on 4/7, and right BKA on 4/9.  LINES / TUBES: Right peripheral IV  Right IJ tunneled HD catheter  Urinary catheter Penrose rain right hand  Wound VAC left hand  Endotracheal tube  NG tube   CULTURES: - 4/4 blood cultures on 3/29 for MSSA - Urine cultures on 3/29 growing MSSA - 1/2 Blood cultures on 3/30 with MSSA - Right toe superficial wound culture with staph aureus  - Left wrist synovial fluid with gram positive cocci  - Blood Cultures 4/4 with 1/2 positive - Blood cultures 4/9 pending   ANTIBIOTICS: - Vancomycin 3/29 -> discontinued on 3/30  - Aztreonam 3/29 -> discontinued on 3/30  - Flagyl 3/29 -> discontinued on  3/30  - Daptomycin 3/30 -> current  SIGNIFICANT EVENTS: Admitted for MSSA bacteremia 3/29 >>> Hypotensive and started on CRRT 3/31 >>> Converted to Atrial Fibrillation 4/1 >>> I&D of right wrist, right foot, and left wrist 4/4 >>> TEE with cardioversion 4/4 >>> Repeat blood cultures 1 of 2 positive on 4/4 >>> CRRT stopped 4/4 >>> Hypotension requiring pressors (neo) 4/4 >>> Left on Vent 4/4 >>> IR drainage of right L1-2 facet abscess 4/5 >>> CRRT restarted 4/5 >>> CRRT stopped 4/7 >>> I&D of right wrist, right foot, and left wrist. Amputation of right 1st ray and right second digit at DIP 4/7 >>> Hypotensive requiring pressor support with Neo 4/8 >>> Right BKA 4/9 >>>  INTERVAL HISTORY:  No major events overnight.  Off pressors.   No family at bedside.  VITAL SIGNS: Temp:  [97.6 F (36.4 C)-99.2 F (37.3 C)] 99.2 F (37.3 C) (04/10 0342) Pulse Rate:  [60-77] 75 (04/10 0400) Resp:  [4-25] 18 (04/10 0400) BP: (81-172)/(43-95) 114/50 (04/10 0400) SpO2:  [97 %-100 %] 100 % (04/10 0400) FiO2 (%):  [40 %] 40 % (04/10 0400)  INTAKE / OUTPUT: Intake/Output      04/09 0701 - 04/10 0700   I.V. (mL/kg) 805 (5.9)   Blood 1385   NG/GT 400   IV Piggyback 230   Total Intake(mL/kg) 2820 (20.6)   Urine (mL/kg/hr) 220 (0.1)   Other 500   Blood 250   Total Output 970   Net +1850         PHYSICAL EXAMINATION:  General: Obese female, sedated  Neuro: Sedated but opens eyes to stimuli, does not track  HEENT: Normocephalic, atraumatic, moist mucus membranes Cardiovascular: RRR, no murmurs, no rubs Lungs: Good air movement with no wheezing or crackles  Abdomen: Active bowel sounds, soft, no tenderness to palpation  Musculoskeletal: Mild LE edema  Skin: Warm and dry   LABS: Cbc Recent Labs  Lab 09/21/2017 0313 09/15/17 0408 10/06/2017 0335 09/17/2017 1731  WBC 15.2* 16.2* 15.4*  --   HGB 6.8* 7.0* 7.9* 7.5*  HCT 20.7* 20.7* 25.5* 22.0*  PLT 197 153 178  --     Chemistry Recent Labs  Lab 09/21/2017 0313 09/15/17 0408 09/15/17 1452 09/12/2017 0335 09/28/2017 0500 10/01/2017 1731  NA 133* 132* 132*  --  131* 137  K 4.1 4.5 4.7  --  5.7* 4.7  CL 99* 100* 99*  --  101  --   CO2 23 18* 21*  --  19*  --   BUN 18 29* 32*  --  42*  --   CREATININE 1.65* 2.74* 3.14*  --  4.06*  --   CALCIUM 7.8* 7.5* 7.7*  --  7.5*  --   MG 2.4 2.3  --  2.3  --   --   PHOS 3.4  3.4 3.7  --   --  7.0*  --   GLUCOSE 130* 117* 108*  --  112* 89   Liver fxn Recent Labs  Lab 09/17/2017 0409  09/12/17 0643  09/10/2017 0313 09/15/17 0408 09/12/2017 0500  AST 67*  --  49*  --   --  55*  --   ALT 28  --  24  --   --  22  --   ALKPHOS 527*  --  417*  --   --  519*  --   BILITOT 5.5*  --  3.8*  --   --  4.1*  --   PROT 6.1*  --  5.8*  --   --  5.7*  --   ALBUMIN 1.4*   < > 1.6*  1.6*   < > 1.6* 1.4*  1.5* 1.4*   < > = values in this interval not displayed.   coags Recent Labs  Lab 09/12/17 0435  INR 1.52   Sepsis markers No results for input(s): LATICACIDVEN, PROCALCITON in the last 168 hours.   Cardiac markers Recent Labs  Lab 09/13/17 0439 09/15/17 0408  CKTOTAL 441* 102   BNP No results for input(s): PROBNP in the last 168 hours.   ABG Recent Labs  Lab 09/12/17 1037 10/02/2017 0607 09/15/17 0715  HCO3 22.6 25.1 22.0  TCO2 24 27 23    CBG trend Recent Labs  Lab 10/07/2017 0758 09/09/2017 1114 09/27/2017 1854 09/24/2017 2338 09/17/17 0341  GLUCAP 106* 95 95 122* 102*   DIAGNOSES: Principal Problem:   MSSA bacteremia Active Problems:   Thalassemia   Rheumatoid arthritis ([place])   Morbid obesity (HCC)   Acute back pain   Essential hypertension   Diabetes ([place])   Dyslipidemia   Severe sepsis with septic shock ([place])   Acute kidney injury ([place])   Immunosuppression due to drug therapy   Rheumatoid arthritis(714.0)   Septic joint of left wrist (HCC)   Osteomyelitis of great toe of right foot ([place])   Hypertension   Hyperlipidemia   Acute  hearing loss of left ear   Diabetes mellitus, insulin dependent (IDDM), controlled (HCC)   Beta thalassemia (HCC)   Symptomatic anemia   Elevated liver enzymes   Hyperbilirubinemia  Altered mental status, unspecified   Atrial flutter (HCC)   Infection of right hand   Abscess   Acute renal failure (HCC)   Acute respiratory failure (HCC)   Encephalopathy   Endotracheally intubated   Staphylococcal arthritis of right wrist (HCC)   Facet joint disease   Pleural effusion  ASSESSMENT / PLAN: 57 y.o female with RA on immunosuppressive therapy (Levunomide and Abatacept) who presented to the ED on 3/29 with sepsis and multiorgan failure secondary to MSSA Bacteremia/Endocaridits.   INFECTIOUS A: Sepsis secondary to MSSA Bacteremia/Endocarditis  MSSA septic arthritis, spinal abscess S/p right BKA on 4/9. In the OR it was noted that the infection appears to be spreading up the left arm.   P:   Infectious disease onboard, continuing daptomycin Orthopedic surgery onboard. Plan to go back to OR for further washout of LUE. Off pressors. Blood cultures from 4/9 pending.  Needs line holiday.   CARDIOVASCULAR A:  Hypotension Patient is hypertensive at baseline on bystolic, irbesartan, and spironolactone.  Atrial Fib 2/2 sepsis  TEE without evidence of large vegetation or thrombus, cardioverted on 4/4.  On amiodarone   P:  Cardiology signed off 4/8.  Currently in sinus rhythm. Restart heparin today.  Continue Amiodarone.  RENAL A: ARF secondary to MSSA bacteremia, blood clots in catheter  Creatine elevated from baseline of 0.9 to 3.36 on admission  UA with hematuria, proteinuria, and bacteruria. Cultures growing Staph aureus.  Mg 1.9 PO4 6.6 K 5.1  P:   Nephrology onboard. HD today. Patient remains oligouric. Maintain MAP >65 Needs line holiday.   PULMONARY A: Post surgical ventilation  Obstructive Sleep Apnea  Vent Mode: PRVC FiO2 (%):  [40 %] 40 % Set Rate:  [18  bmp] 18 bmp Vt Set:  [470 mL] 470 mL PEEP:  [5 cmH20] 5 cmH20 Plateau Pressure:  [21 cmH20-29 cmH20] 25 cmH20   Intubate on 4/4  P:   RASS: -3 Current sedation: Fentanyl  VAP precautions CXR illustrating stable endotracheal tube and stable vascular congestion  Patient to go back to OR. Wean sedation as tolerate but will not extubate.   GASTROINTESTINAL A: Elevated LFTs ? Cholestatic liver injury  Presented with gross elevation in alk phos and direct bilirubin with minor elevation in LFTs and indirect bilirubin.  Abdominal ultrasound unremarkable for acute etiology with no pericholecystic fluid, wall thickening, gallstones, or ductal dilation.  CT abdomen and pelvis yesterday with normal liver, intra-/extrahepatic biliary duct, and gallbladder.  Ammonium elevated to 44, INR 1.65  P:   Repeating hepatic function panel today  Will continue to monitor.   HEMATOLOGIC A:  Beta Thalassemia  Acute on chronic anemia secondary to sepsis Hgb dropped from baseline of ~10 to 7.6 on admission.  LDH elevated but haptoglobin elevated and reticulocyte WNL therefore, unlikely to be 2/2 hemolytic anemia.   P:  Hgb 8.8 s/p 4 units. Transfusion goals: Transfuse 1 units pRBCs if <7 or <8 if patient becomes hemodynamically unstable and/or has active bleeding   ENDOCRINE A: Insulin Dependent Type 2 DM On Humolin 75 units TID at home   P:   CBG goal 140-180  Continue SSI-moderate   NEUROLOGICAL A: Altered Mental Status  Multiple contributing factors including A-fib, sepsis, opiates CT head and ammonium WNL  P:  Wean sedation.  Pulmonary and [place] Pager: [phone]  09/17/2017, 5:25 AM

## 2017-09-17 NOTE — Consult Note (Signed)
   [facility] CM Inpatient Consult   09/17/2017  Jessica Howard 13-Jul-1960 [id number]   Patient is currently active with [facility] for chronic disease management services.  Patient has been engaged by a [facility] [facility] [place].  Our community based plan of care has focused on disease management and community resource support. Made [facility] Management staff aware of patient's hospitalization. Patient will receive a post discharge transition of care call and will be evaluated for monthly home visits for assessments and disease process education.  Will follow up for progress and disposition needs as appropriate for transition.  Of note, [facility] Care Management services does not replace or interfere with any services that are needed or arranged by inpatient case management or social work.  For additional questions or referrals please contact:   Natividad Brood, RN BSN [facility] Liaison  [phone] business mobile phone Toll free office [phone]

## 2017-09-17 NOTE — Progress Notes (Signed)
Patient still intubated, not following commands, but does open eyes.  Dressings changed today, her wounds actually look as good as they ever have, the right below-knee amputation dressing was left intact, it appeared clean without drainage.  The right thumb dressing was changed, no significant purulence or drainage, the Xeroform was left intact.  The right index finger dressing was changed, this wound also appears clean, minimal drainage.  The left upper extremity dressing was changed, and the elbow and wrist look the best that they have looked the date, although still tenuous.  The soft tissue bridge in the midline appears tenuous as well, I am concerned that this may not survive.       Impression: Systemic sepsis seeding multiple areas, status post right below-knee amputation, right distal phalanx amputation of the index finger, right thumb I&D, left radiocarpal, midcarpal, carpal metacarpal, DRUJ sepsis I&D, left olecranon septic bursitis  Plan continue IV antibiotics and intensive care unit support.  Her pulmonary function and mental status is apparently still  poor, and she is not ready for extubation.  Plan for repeat surgical I&D of the left elbow and wrist tomorrow, we will also change the dressings on the index finger and the thumb tomorrow.  We may include Dr. Doreatha Martin, or Dr. Marcelino Scot tomorrow with repeat surgical intervention, will talk with family as well.

## 2017-09-17 NOTE — Progress Notes (Addendum)
Subjective:  Intubated   Antibiotics:  Anti-infectives (From admission, onward)   Start     Dose/Rate Route Frequency Ordered Stop   09/10/2017 2100  DAPTOmycin (CUBICIN) 1,000 mg in sodium chloride 0.9 % IVPB  Status:  Discontinued     1,000 mg 240 mL/hr over 30 Minutes Intravenous Every 48 hours 10/02/2017 2033 09/26/2017 2047   09/26/2017 2047  DAPTOmycin (CUBICIN) 1,000 mg in sodium chloride 0.9 % IVPB     1,000 mg 240 mL/hr over 30 Minutes Intravenous Every 48 hours 09/17/2017 2047     09/12/2017 1800  DAPTOmycin (CUBICIN) 1,000 mg in sodium chloride 0.9 % IVPB  Status:  Discontinued     1,000 mg 240 mL/hr over 30 Minutes Intravenous Every 48 hours 09/10/2017 1103 10/04/2017 2033   09/08/17 1600  DAPTOmycin (CUBICIN) 1,000 mg in sodium chloride 0.9 % IVPB  Status:  Discontinued     1,000 mg 240 mL/hr over 30 Minutes Intravenous Every 48 hours 09/07/17 1019 09/19/2017 1103   09/07/17 1000  vancomycin (VANCOCIN) 1,500 mg in sodium chloride 0.9 % 500 mL IVPB  Status:  Discontinued     1,500 mg 250 mL/hr over 120 Minutes Intravenous Every 48 hours 08/08/2017 0906 09/06/17 0935   09/06/17 1600  DAPTOmycin (CUBICIN) 984 mg in sodium chloride 0.9 % IVPB  Status:  Discontinued     8 mg/kg  123 kg 239.4 mL/hr over 30 Minutes Intravenous Every 48 hours 09/06/17 1436 09/07/17 1019   09/06/17 1000  aztreonam (AZACTAM) 0.5 g in dextrose 5 % 50 mL IVPB  Status:  Discontinued     0.5 g 100 mL/hr over 30 Minutes Intravenous Every 8 hours 09/06/17 0943 09/06/17 1223   08/22/2017 2000  metroNIDAZOLE (FLAGYL) IVPB 500 mg  Status:  Discontinued     500 mg 100 mL/hr over 60 Minutes Intravenous Every 8 hours 08/09/2017 1343 09/06/17 0028   08/15/2017 0900  aztreonam (AZACTAM) 2 g in sodium chloride 0.9 % 100 mL IVPB  Status:  Discontinued     2 g 200 mL/hr over 30 Minutes Intravenous Every 8 hours 08/17/2017 0853 09/06/17 0028   08/20/2017 0900  metroNIDAZOLE (FLAGYL) IVPB 500 mg     500 mg 100 mL/hr over 60  Minutes Intravenous  Once 08/10/2017 0853 08/27/2017 1035   09/06/2017 0830  piperacillin-tazobactam (ZOSYN) IVPB 3.375 g  Status:  Discontinued     3.375 g 100 mL/hr over 30 Minutes Intravenous  Once 09/02/2017 0816 09/03/2017 0836   08/20/2017 0830  vancomycin (VANCOCIN) IVPB 1000 mg/200 mL premix  Status:  Discontinued     1,000 mg 200 mL/hr over 60 Minutes Intravenous  Once 08/09/2017 0816 08/22/2017 0822   08/17/2017 0830  vancomycin (VANCOCIN) 2,000 mg in sodium chloride 0.9 % 500 mL IVPB     2,000 mg 250 mL/hr over 120 Minutes Intravenous  Once 09/01/2017 0822 08/29/2017 1342      Medications: Scheduled Meds: . amiodarone  200 mg Oral BID  . chlorhexidine gluconate (MEDLINE KIT)  15 mL Mouth Rinse BID  . Chlorhexidine Gluconate Cloth  6 each Topical Daily  . docusate  100 mg Per Tube BID  . feeding supplement (PRO-STAT SUGAR FREE 64)  60 mL Per Tube TID  . feeding supplement (VITAL HIGH PROTEIN)  1,000 mL Per Tube Q24H  . insulin aspart  0-15 Units Subcutaneous Q4H  . lactulose  10 g Oral Daily  . mouth rinse  15 mL Mouth Rinse 10  times per day  . pantoprazole (PROTONIX) IV  40 mg Intravenous Q24H  . sennosides  5 mL Per Tube Daily  . sodium chloride flush  3 mL Intravenous Q12H   Continuous Infusions: . sodium chloride 10 mL/hr at 09/27/2017 2200  . DAPTOmycin (CUBICIN)  IV Stopped (10/03/2017 2135)  . fentaNYL infusion INTRAVENOUS 300 mcg/hr (09/17/17 1200)  . heparin    . phenylephrine (NEO-SYNEPHRINE) Adult infusion Stopped (09/19/2017 2100)   PRN Meds:.acetaminophen **OR** acetaminophen, albuterol, fentaNYL, LORazepam, ondansetron **OR** ondansetron (ZOFRAN) IV    Objective: Weight change: -10 lb 5.8 oz (-4.7 kg)  Intake/Output Summary (Last 24 hours) at 09/17/2017 1255 Last data filed at 09/17/2017 1200 Gross per 24 hour  Intake 2430.77 ml  Output 1025 ml  Net 1405.77 ml   Blood pressure (!) 86/49, pulse 88, temperature (!) 102.2 F (39 C), temperature source Oral, resp. rate 15,  height 5' 2"  (1.575 m), weight 291 lb 7.2 oz (132.2 kg), last menstrual period 12/27/2013, SpO2 99 %. Temp:  [97.8 F (36.6 C)-102.2 F (39 C)] 102.2 F (39 C) (04/10 1200) Pulse Rate:  [62-96] 88 (04/10 1200) Resp:  [8-24] 15 (04/10 1200) BP: (86-172)/(38-95) 86/49 (04/10 1200) SpO2:  [97 %-100 %] 99 % (04/10 1200) FiO2 (%):  [40 %] 40 % (04/10 1200) Weight:  [291 lb 7.2 oz (132.2 kg)] 291 lb 7.2 oz (132.2 kg) (04/10 0500)  Physical Exam: General: Intubated  HEENT: anicteric sclera,EOMI CVS regular rate, normal r,  no murmur rubs or gallops Chest: clear to auscultation anteriorly  abdomen: soft nontender, nondistended, normal bowel sounds, Extremities/skin:  She has a vacuum dressing in her left wrist and her right wrist is wrapped extensively right lower extremity also dressed Right LE bandaged at BKA site   Neuro: nonfocal  CBC:    BMET Recent Labs    09/21/2017 0500 09/13/2017 1731 09/17/17 0540  NA 131* 137 133*  K 5.7* 4.7 5.1  CL 101  --  99*  CO2 19*  --  23  GLUCOSE 112* 89 118*  BUN 42*  --  36*  CREATININE 4.06*  --  3.13*  CALCIUM 7.5*  --  7.5*     Liver Panel  Recent Labs    09/15/17 0408 10/01/2017 0500 09/17/17 0540  PROT 5.7*  --  5.6*  ALBUMIN 1.4*  1.5* 1.4* 1.3*  1.3*  AST 55*  --  52*  ALT 22  --  24  ALKPHOS 519*  --  409*  BILITOT 4.1*  --  5.0*  BILIDIR 2.7*  --  3.7*  IBILI 1.4*  --  1.3*       Sedimentation Rate No results for input(s): ESRSEDRATE in the last 72 hours. C-Reactive Protein No results for input(s): CRP in the last 72 hours.  Micro Results: Recent Results (from the past 720 hour(s))  Blood Culture (routine x 2)     Status: Abnormal   Collection Time: 08/08/2017  9:50 AM  Result Value Ref Range Status   Specimen Description BLOOD RIGHT HAND  Final   Special Requests   Final    BOTTLES DRAWN AEROBIC AND ANAEROBIC Blood Culture adequate volume   Culture  Setup Time   Final    GRAM POSITIVE COCCI IN BOTH  AEROBIC AND ANAEROBIC BOTTLES CRITICAL RESULT CALLED TO, READ BACK BY AND VERIFIED WITH: G ABBOTT 09/06/17 0006 JDW    Culture (A)  Final    STAPHYLOCOCCUS AUREUS SUSCEPTIBILITIES PERFORMED ON PREVIOUS CULTURE WITHIN THE LAST  5 DAYS. Performed at [facility], [place] [address], [place], [place] [address]    Report Status 09/08/2017 FINAL  Final  Blood Culture (routine x 2)     Status: Abnormal   Collection Time: 08/11/2017  1:54 PM  Result Value Ref Range Status   Specimen Description BLOOD RIGHT FOREARM  Final   Special Requests   Final    BOTTLES DRAWN AEROBIC AND ANAEROBIC Blood Culture adequate volume   Culture  Setup Time   Final    GRAM POSITIVE COCCI IN BOTH AEROBIC AND ANAEROBIC BOTTLES CRITICAL RESULT CALLED TO, READ BACK BY AND VERIFIED WITH: Denton Brick PHARMD 09/06/17 0006 JDW Performed at [facility], [place] [address], [place], [place] [address]    Culture STAPHYLOCOCCUS AUREUS (A)  Final   Report Status 09/08/2017 FINAL  Final   Organism ID, Bacteria STAPHYLOCOCCUS AUREUS  Final      Susceptibility   Staphylococcus aureus - MIC*    CIPROFLOXACIN <=0.5 SENSITIVE Sensitive     ERYTHROMYCIN <=0.25 SENSITIVE Sensitive     GENTAMICIN <=0.5 SENSITIVE Sensitive     OXACILLIN 0.5 SENSITIVE Sensitive     TETRACYCLINE <=1 SENSITIVE Sensitive     VANCOMYCIN 1 SENSITIVE Sensitive     TRIMETH/SULFA <=10 SENSITIVE Sensitive     CLINDAMYCIN <=0.25 SENSITIVE Sensitive     RIFAMPIN <=0.5 SENSITIVE Sensitive     Inducible Clindamycin NEGATIVE Sensitive     * STAPHYLOCOCCUS AUREUS  Blood Culture ID Panel (Reflexed)     Status: Abnormal   Collection Time: 08/30/2017  1:54 PM  Result Value Ref Range Status   Enterococcus species NOT DETECTED NOT DETECTED Final   Listeria monocytogenes NOT DETECTED NOT DETECTED Final   Staphylococcus species DETECTED (A) NOT DETECTED Final    Comment: CRITICAL RESULT CALLED TO, READ BACK BY AND VERIFIED WITH: G ABBOTT PHARMD 09/06/17 0006 JDW     Staphylococcus aureus DETECTED (A) NOT DETECTED Final    Comment: Methicillin (oxacillin) susceptible Staphylococcus aureus (MSSA). Preferred therapy is anti staphylococcal beta lactam antibiotic (Cefazolin or Nafcillin), unless clinically contraindicated. CRITICAL RESULT CALLED TO, READ BACK BY AND VERIFIED WITH: G ABBOTT PHARMD 09/06/17 0006 JDW    Methicillin resistance NOT DETECTED NOT DETECTED Final   Streptococcus species NOT DETECTED NOT DETECTED Final   Streptococcus agalactiae NOT DETECTED NOT DETECTED Final   Streptococcus pneumoniae NOT DETECTED NOT DETECTED Final   Streptococcus pyogenes NOT DETECTED NOT DETECTED Final   Acinetobacter baumannii NOT DETECTED NOT DETECTED Final   Enterobacteriaceae species NOT DETECTED NOT DETECTED Final   Enterobacter cloacae complex NOT DETECTED NOT DETECTED Final   Escherichia coli NOT DETECTED NOT DETECTED Final   Klebsiella oxytoca NOT DETECTED NOT DETECTED Final   Klebsiella pneumoniae NOT DETECTED NOT DETECTED Final   Proteus species NOT DETECTED NOT DETECTED Final   Serratia marcescens NOT DETECTED NOT DETECTED Final   Haemophilus influenzae NOT DETECTED NOT DETECTED Final   Neisseria meningitidis NOT DETECTED NOT DETECTED Final   Pseudomonas aeruginosa NOT DETECTED NOT DETECTED Final   Candida albicans NOT DETECTED NOT DETECTED Final   Candida glabrata NOT DETECTED NOT DETECTED Final   Candida krusei NOT DETECTED NOT DETECTED Final   Candida parapsilosis NOT DETECTED NOT DETECTED Final   Candida tropicalis NOT DETECTED NOT DETECTED Final    Comment: Performed at [facility], [place] [address] [place], [place] [address]  Urine culture     Status: Abnormal   Collection Time: 08/14/2017  3:56  PM  Result Value Ref Range Status   Specimen Description URINE, RANDOM  Final   Special Requests   Final    NONE Performed at [facility], [address], [place], [place] [address]    Culture 50,000 COLONIES/mL STAPHYLOCOCCUS AUREUS  (A)  Final   Report Status 09/07/2017 FINAL  Final   Organism ID, Bacteria STAPHYLOCOCCUS AUREUS (A)  Final      Susceptibility   Staphylococcus aureus - MIC*    CIPROFLOXACIN <=0.5 SENSITIVE Sensitive     GENTAMICIN <=0.5 SENSITIVE Sensitive     NITROFURANTOIN 32 SENSITIVE Sensitive     OXACILLIN 0.5 SENSITIVE Sensitive     TETRACYCLINE <=1 SENSITIVE Sensitive     VANCOMYCIN 1 SENSITIVE Sensitive     TRIMETH/SULFA <=10 SENSITIVE Sensitive     CLINDAMYCIN <=0.25 SENSITIVE Sensitive     RIFAMPIN <=0.5 SENSITIVE Sensitive     Inducible Clindamycin NEGATIVE Sensitive     * 50,000 COLONIES/mL STAPHYLOCOCCUS AUREUS  MRSA PCR Screening     Status: None   Collection Time: 09/06/17  5:20 AM  Result Value Ref Range Status   MRSA by PCR NEGATIVE NEGATIVE Final    Comment:        The GeneXpert MRSA Assay (FDA approved for NASAL specimens only), is one component of a comprehensive MRSA colonization surveillance program. It is not intended to diagnose MRSA infection nor to guide or monitor treatment for MRSA infections. Performed at [facility], [place] [address] [place], [place] [address]   Culture, blood (routine x 2)     Status: Abnormal   Collection Time: 09/07/17  7:09 AM  Result Value Ref Range Status   Specimen Description BLOOD RIGHT HAND  Final   Special Requests AEROBIC BOTTLE ONLY Blood Culture adequate volume  Final   Culture  Setup Time   Final    GRAM POSITIVE COCCI AEROBIC BOTTLE ONLY CRITICAL VALUE NOTED.  VALUE IS CONSISTENT WITH PREVIOUSLY REPORTED AND CALLED VALUE.    Culture (A)  Final    STAPHYLOCOCCUS AUREUS SUSCEPTIBILITIES PERFORMED ON PREVIOUS CULTURE WITHIN THE LAST 5 DAYS. Performed at [facility], [place] [address] [place], [place] [address]    Report Status 09/09/2017 FINAL  Final  Culture, blood (routine x 2)     Status: None   Collection Time: 09/07/17  7:17 AM  Result Value Ref Range Status   Specimen Description BLOOD LEFT ANTECUBITAL   Final   Special Requests   Final    IN BOTH AEROBIC AND ANAEROBIC BOTTLES Blood Culture results may not be optimal due to an excessive volume of blood received in culture bottles   Culture   Final    NO GROWTH 5 DAYS Performed at [facility], [place] [address], [place], [place] [address]    Report Status 09/12/2017 FINAL  Final  Aerobic Culture (superficial specimen)     Status: None   Collection Time: 09/08/17  3:10 PM  Result Value Ref Range Status   Specimen Description WOUND RIGHT TOE  Final   Special Requests   Final    NONE Performed at [facility], [place] [address], [place], [place] [address]    Gram Stain   Final    RARE WBC PRESENT, PREDOMINANTLY MONONUCLEAR FEW GRAM POSITIVE COCCI ABUNDANT SQUAMOUS EPITHELIAL CELLS PRESENT    Culture ABUNDANT STAPHYLOCOCCUS AUREUS  Final   Report Status 09/10/2017 FINAL  Final   Organism ID, Bacteria STAPHYLOCOCCUS AUREUS  Final  Susceptibility   Staphylococcus aureus - MIC*    CIPROFLOXACIN <=0.5 SENSITIVE Sensitive     ERYTHROMYCIN <=0.25 SENSITIVE Sensitive     GENTAMICIN <=0.5 SENSITIVE Sensitive     OXACILLIN 0.5 SENSITIVE Sensitive     TETRACYCLINE <=1 SENSITIVE Sensitive     VANCOMYCIN <=0.5 SENSITIVE Sensitive     TRIMETH/SULFA <=10 SENSITIVE Sensitive     CLINDAMYCIN <=0.25 SENSITIVE Sensitive     RIFAMPIN <=0.5 SENSITIVE Sensitive     Inducible Clindamycin NEGATIVE Sensitive     * ABUNDANT STAPHYLOCOCCUS AUREUS  Body fluid culture     Status: None   Collection Time: 09/09/17  3:00 PM  Result Value Ref Range Status   Specimen Description SYNOVIAL LEFT WRIST  Final   Special Requests NONE  Final   Gram Stain   Final    ABUNDANT WBC PRESENT, PREDOMINANTLY PMN MODERATE GRAM POSITIVE COCCI Performed at [facility], [place] [address] [place], [place] [address]    Culture FEW STAPHYLOCOCCUS AUREUS  Final   Report Status 09/12/2017 FINAL  Final   Organism ID, Bacteria STAPHYLOCOCCUS AUREUS  Final       Susceptibility   Staphylococcus aureus - MIC*    CIPROFLOXACIN <=0.5 SENSITIVE Sensitive     ERYTHROMYCIN <=0.25 SENSITIVE Sensitive     GENTAMICIN <=0.5 SENSITIVE Sensitive     OXACILLIN 0.5 SENSITIVE Sensitive     TETRACYCLINE <=1 SENSITIVE Sensitive     VANCOMYCIN <=0.5 SENSITIVE Sensitive     TRIMETH/SULFA <=10 SENSITIVE Sensitive     CLINDAMYCIN <=0.25 SENSITIVE Sensitive     RIFAMPIN <=0.5 SENSITIVE Sensitive     Inducible Clindamycin NEGATIVE Sensitive     * FEW STAPHYLOCOCCUS AUREUS  Culture, blood (Routine X 2) w Reflex to ID Panel     Status: Abnormal   Collection Time: 09/27/2017  9:25 AM  Result Value Ref Range Status   Specimen Description BLOOD LEFT FOREARM  Final   Special Requests   Final    BOTTLES DRAWN AEROBIC AND ANAEROBIC Blood Culture adequate volume   Culture  Setup Time   Final    GRAM POSITIVE COCCI AEROBIC BOTTLE ONLY CRITICAL RESULT CALLED TO, READ BACK BY AND VERIFIED WITH: A MEYER,PHARMD AT [place] 09/15/2017 BY L BENFIELD Performed at [facility], [place] [address] [place], [place] [address]    Culture STAPHYLOCOCCUS AUREUS (A)  Final   Report Status 10/07/2017 FINAL  Final   Organism ID, Bacteria STAPHYLOCOCCUS AUREUS  Final      Susceptibility   Staphylococcus aureus - MIC*    CIPROFLOXACIN <=0.5 SENSITIVE Sensitive     ERYTHROMYCIN <=0.25 SENSITIVE Sensitive     GENTAMICIN <=0.5 SENSITIVE Sensitive     OXACILLIN 0.5 SENSITIVE Sensitive     TETRACYCLINE <=1 SENSITIVE Sensitive     VANCOMYCIN 1 SENSITIVE Sensitive     TRIMETH/SULFA <=10 SENSITIVE Sensitive     CLINDAMYCIN <=0.25 SENSITIVE Sensitive     RIFAMPIN <=0.5 SENSITIVE Sensitive     Inducible Clindamycin NEGATIVE Sensitive     * STAPHYLOCOCCUS AUREUS  Culture, blood (single)     Status: None   Collection Time: 09/26/2017  9:32 AM  Result Value Ref Range Status   Specimen Description BLOOD RIGHT HAND  Final   Special Requests   Final    BOTTLES DRAWN AEROBIC ONLY Blood Culture adequate  volume   Culture   Final    NO GROWTH 5 DAYS Performed at [facility], [place],  [place] [address]    Report Status 09/19/2017 FINAL  Final  Aerobic/Anaerobic Culture (surgical/deep wound)     Status: None   Collection Time: 09/12/17  1:53 PM  Result Value Ref Range Status   Specimen Description ABSCESS  Final   Special Requests Normal  Final   Gram Stain   Final    ABUNDANT WBC PRESENT, PREDOMINANTLY PMN ABUNDANT GRAM POSITIVE COCCI IN CLUSTERS    Culture   Final    FEW STAPHYLOCOCCUS AUREUS NO ANAEROBES ISOLATED Performed at [facility], [address] [place], [place] [address]    Report Status 09/17/2017 FINAL  Final   Organism ID, Bacteria STAPHYLOCOCCUS AUREUS  Final      Susceptibility   Staphylococcus aureus - MIC*    CIPROFLOXACIN <=0.5 SENSITIVE Sensitive     ERYTHROMYCIN <=0.25 SENSITIVE Sensitive     GENTAMICIN <=0.5 SENSITIVE Sensitive     OXACILLIN 0.5 SENSITIVE Sensitive     TETRACYCLINE <=1 SENSITIVE Sensitive     VANCOMYCIN <=0.5 SENSITIVE Sensitive     TRIMETH/SULFA <=10 SENSITIVE Sensitive     CLINDAMYCIN <=0.25 SENSITIVE Sensitive     RIFAMPIN <=0.5 SENSITIVE Sensitive     Inducible Clindamycin NEGATIVE Sensitive     * FEW STAPHYLOCOCCUS AUREUS  Aerobic/Anaerobic Culture (surgical/deep wound)     Status: None (Preliminary result)   Collection Time: 10/04/2017  5:00 PM  Result Value Ref Range Status   Specimen Description WOUND  Final   Special Requests LEFT ELBOW PT ON DAPTOMYCIN  Final   Gram Stain   Final    RARE WBC PRESENT, PREDOMINANTLY PMN FEW GRAM POSITIVE COCCI    Culture   Final    CULTURE REINCUBATED FOR BETTER GROWTH Performed at [facility], [place] [address] [place], [place] [address]    Report Status PENDING  Incomplete    Studies/Results: Dg Chest Port 1 View  Result Date: 09/17/2017 CLINICAL DATA:  Shortness of Breath EXAM: PORTABLE CHEST 1 VIEW COMPARISON:  09/13/2017 FINDINGS: The cardiac  shadow is mildly enlarged. Endotracheal tube and nasogastric catheter is well as a right jugular central line again seen and stable. No pneumothorax is noted. Significant patient rotation is noted accentuating the mediastinal markings. Central vascular congestion is again seen and stable. Small right-sided pleural effusion is noted. The left-sided pleural effusion is not well appreciated on this exam. IMPRESSION: Stable vascular congestion. Tubes and lines as described. Small right pleural effusion. Electronically Signed   By: Inez Catalina M.D.   On: 09/17/2017 07:04   Dg Chest Port 1 View  Result Date: 09/13/2017 CLINICAL DATA:  Respiratory failure, intubated patient. History of asthma, recent debridement of the great toe and the wrists. EXAM: PORTABLE CHEST 1 VIEW COMPARISON:  Chest x-ray of September 15, 2017 FINDINGS: The lungs are reasonably well inflated. There small bilateral pleural effusions layering posteriorly. The interstitial markings are increased. The cardiac silhouette is enlarged and the pulmonary vascularity engorged. The endotracheal tube tip lies 5.1 cm above the carina. The esophagogastric tube tip in proximal port project below the inferior margin of the image. The dual-lumen dialysis catheter tip projects over the distal third of the SVC. IMPRESSION: Stable appearance of the chest since yesterday's study. Mild pulmonary edema is secondary to CHF. No focal infiltrates. Small bilateral pleural effusions. Electronically Signed   By: David  Martinique M.D.   On: 09/24/2017 06:56      Assessment/Plan:  INTERVAL HISTORY:   Is off pressors  Principal Problem:  MSSA bacteremia Active Problems:   Thalassemia   Rheumatoid arthritis ([place])   Morbid obesity ([place])   Acute back pain   Essential hypertension   Diabetes ([place])   Dyslipidemia   Severe sepsis with septic shock ([place])   Acute kidney injury ([place])   Immunosuppression due to drug therapy   Rheumatoid arthritis(714.0)   Septic joint  of left wrist (HCC)   Osteomyelitis of great toe of right foot ([place])   Hypertension   Hyperlipidemia   Acute hearing loss of left ear   Diabetes mellitus, insulin dependent (IDDM), controlled (HCC)   Beta thalassemia (HCC)   Symptomatic anemia   Elevated liver enzymes   Hyperbilirubinemia   Altered mental status, unspecified   Atrial flutter (HCC)   Infection of right hand   Abscess   Acute renal failure (HCC)   Acute respiratory failure (HCC)   Encephalopathy   Endotracheally intubated   Staphylococcal arthritis of right wrist ([place])   Facet joint disease   Pleural effusion    Jessica Howard is a 57 y.o. female with  RA  Admitted with MSSA B with septic shock, ARF, shock liver with evidence of endocarditis and "metastatic infection:"With severe infection of her right ankle and toe that will require a below the knee amputation extensive infection in bilateral wrists left worse than right status post surgery by Dr. Amedeo Plenty, also with L2-L3 septic facet joint with pus collection that has been drained  #1 MSSAB with shock metastatic disease:        Antimicrobial Management Team Staphylococcus aureus bacteremia   Staphylococcus aureus bacteremia (SAB) is associated with a high rate of complications and mortality.  Specific aspects of clinical management are critical to optimizing the outcome of patients with SAB.  Therefore, the [facility] Antimicrobial Management Team [facility]) has initiated an intervention aimed at improving the management of SAB at [facility].  To do so, Infectious Diseases physicians are providing an evidence-based consult for the management of all patients with SAB.     Yes No Comments  Perform follow-up blood cultures (even if the patient is afebrile) to ensure clearance of bacteremia [x]  []  10/05/2017 ARE growing 1/2 sites unfortunately, repeat cultures taken  Gene [place]ry blood cultures done to prove clearance with lines gone not sure that is  going to be durab do able  in the near future but will see  Remove vascular catheter and obtain follow-up blood cultures after the removal of the catheter []  []  HD line holiday when possible If taken out today grab cultures afterwards so we can try to prove clearance.   Perform echocardiography to evaluate for endocarditis (transthoracic ECHO is 40-50% sensitive, TEE is > 90% sensitive) []  []  Please keep in mind, that neither test can definitively EXCLUDE endocarditis, and that should clinical suspicion remain high for endocarditis the patient should then still be treated with an "endocarditis" duration of therapy = 6 weeks  Thickened leaflets on the mitral valve but without clear-cut vegetation  Consult electrophysiologist to evaluate implanted cardiac device (pacemaker, ICD) []  []    Ensure source control []  []  Have all abscesses been drained effectively? Have deep seeded infections (septic joints or osteomyelitis) had appropriate surgical debridement?    She is going back to the operating room today for below the knee amputation on the right and potentially further surgeries in her upper extremities  Investigate for "metastatic" sites of infection []  []  Does the patient have ANY symptom or physical exam finding that would  suggest a deeper infection (back or neck pain that may be suggestive of vertebral osteomyelitis or epidural abscess, muscle pain that could be a symptom of pyomyositis)?  Keep in mind that for deep seeded infections MRI imaging with contrast is preferred rather than other often insensitive tests such as plain x-rays, especially early in a patient's presentation.  She has had aggressive surgery by Dr. Amedeo Plenty and Mardelle Matte   See below She does have pleural effusions   Change antibiotic therapy to daptomycin []  []  Beta-lactam antibiotics are preferred for MSSA due to higher cure rates.   If on Vancomycin, goal trough should be 15 - 20 mcg/mL  Estimated duration of IV antibiotic  therapy:  8 weeks []  []  Consult case management for probably prolonged outpatient IV antibiotic therapy   #2 Wrist infections: she is now sp  1.  Right below-knee amputation 2.  Right index finger irrigation, debridement, skin, subcutaneous tissue, cartilage, bone, with delayed primary closure of the fingertip 3.  Right thumb excisional debridement, skin, subcutaneous tissue, fascia, 4.  Needle aspiration of left olecranon bursa yielding substantial purulence 5.  Olecranon bursectomy, left elbow 6.  Incision, irrigation, debridement, skin, subcutaneous tissue, and fascia, and periosteum overlying bone,  left elbow olecranon 7.  Incision, exploratory fasciotomy of the extensor compartment of the forearm, no evidence of purulence. 8.  Arthrotomy with irrigation and debridement, distal radial ulnar joint, proximal radiocarpal joint, midcarpal joint, and carpal metacarpal joint 9.  Extensive tenosynovectomy, dorsal compartment 2 through 6 10.  Irrigation, debridement, skin, subcutaneous tissue, tendon, bone, left distal forearm and carpus 11.  Needle aspiration right Achilles bursa, no evidence for infection  I can see Dr. Luanna Cole notes and pictures and greatly appreciate his work with Dr. Amedeo Plenty and now likely also to involve Dr. Marcelino Scot and Dr. Doreatha Martin going back to the operating room tomorrow.  Patient looks like she may be at risk of losing further tissue  #3 Left foot infection: Status post transtibial amputation #4 LBP: facet jiont infection as post IR guided drainage  #5 Neuro: confusion: more likely due to multifactorial problems she did not have left sided endocarditis  #6 Pleural effusions: Sufficiently large would get a thoracentesis for diagnostic and therapeutic purposes  ADDENDUM:  I came back for discussion of goals of care and prognosis with Dr. Tarri Abernethy and patients family including her son, sister and mother.  I agree with Dr. Mardelle Matte that prognosis is unfortunately fairly  grim. Family desire to proceed with aggressive care though they understand that if things do not go well we will need to reconsider whether continuing to be aggressive with her care will provide any chance of meaningful recovery. The son indicates that he would like for tomorrow to be her "last surgery" and for it to be as thorough as possible"    LOS: 12 days   Alcide Evener 09/17/2017, 12:55 PM

## 2017-09-17 NOTE — Progress Notes (Signed)
ANTICOAGULATION CONSULT NOTE - Follow Up Consult  Pharmacy Consult for heparin Indication: atrial fibrillation  Allergies  Allergen Reactions  . Bactrim [Sulfamethoxazole-Trimethoprim] Hives  . Cefuroxime Axetil Other (See Comments)    Blistering wounds per family  . Cephalosporins Other (See Comments)    Blistering wounds as an adult per family  . Lisinopril Cough  . Penicillins Hives  . Iohexol Itching and Rash     Code: RASH, Desc: HAD ITCHING AND A RASH ABOUT ONE HOUR AFTER RETURNING HOME FROM THE CT, Onset Date: [id number]   . Sulfa Antibiotics Hives    Patient Measurements: Height: 5\' 2"  (157.5 cm) Weight: 291 lb 7.2 oz (132.2 kg) IBW/kg (Calculated) : 50.1 Heparin Dosing Weight: 81 kg  Vital Signs: Temp: 99.4 F (37.4 C) (04/10 0725) Temp Source: Oral (04/10 0725) BP: 134/63 (04/10 0800) Pulse Rate: 96 (04/10 0800)  Labs: Recent Labs    09/15/17 0408 09/15/17 0844 09/15/17 1452 09/15/17 2049 09/12/2017 0335 10/02/2017 0500 09/08/2017 1731 09/17/17 0540  HGB 7.0*  --   --   --  7.9*  --  7.5* 8.8*  HCT 20.7*  --   --   --  25.5*  --  22.0* 27.3*  PLT 153  --   --   --  178  --   --  160  HEPARINUNFRC  --  <0.10*  --  0.24* <0.10*  --   --   --   CREATININE 2.74*  --  3.14*  --   --  4.06*  --  3.13*  CKTOTAL 102  --   --   --   --   --   --  439*    Estimated Creatinine Clearance: 26.6 mL/min (A) (by C-G formula based on SCr of 3.13 mg/dL (H)).   Assessment: 86 yoF with disseminated MSSA and rapid AFib now s/p DCCV on 4/4. Pt has been on heparin intermittently while holding for surgeries. Pt previously slightly subtherapeutic on 2050 units/hr but given recent surgery will initiate conservatively at this dose.  Goal of Therapy:  Heparin level 0.3-0.7 units/ml Monitor platelets by anticoagulation protocol: Yes   Plan:  -Heparin 2050 units/hr -Check 8-hr heparin level -Daily heparin level, CBC  Arrie Senate, PharmD, BCPS PGY-2 Cardiology Pharmacy  Resident Pager: [phone] 09/17/2017

## 2017-09-17 NOTE — Progress Notes (Signed)
Patient ID: Jessica Howard, female   DOB: 10-14-1960, 57 y.o.   MRN: [id number] [facility] Progress Note   Assessment/ Plan:    1. Acute kidney injury: Oliguric overnight with 235cc urine output-suspected ATN in setting of sepsis and hypotension with evidence of disseminated MSSA septicemia with multifocal seeding. Started on CVVHD 09/07/17 stopped on 10/02/2017.  Plan for HD again today for clearance and correction of rising potassium.  2. MSSA septicemia with diffuseseeding of joints/spine, osteo of right great toe. ID following and TEEwith possible MV endocarditis but no vegetations seen.On Daptomycinper ID. S/P right BKA and multifocal irrigation/debridement and exploratory fasciotomy. 3. Atrial fibrillation currently sinus rhythm and rate controlled on amiodarone s/p DCCV. 4. Abnormal LFT's-possibly shock liver associated with bacteremia/sepsis.  Hypoalbuminemia likely from synthetic defect versus acute phase reactant. 5. Anemia of acute illness- follow and transfuse prn 6. Vascular access- RIJ catheter placed 09/07/17. 7. Rheumatoid arthritis  8. Thalassemia  Subjective:   Aebrile overnight s/p multi-joint irrigation and debridement, fasciotomy and right BKA yesterday. Off pressors and without acute events overnight.   Objective:   BP 131/61   Pulse 92   Temp 99.4 F (37.4 C) (Oral)   Resp 18   Ht _0  (1.575 m)   Wt 132.2 kg (291 lb 7.2 oz)   LMP 12/27/2013 Comment: irreggular  SpO2 100%   BMI 53.31 kg/m   Intake/Output Summary (Last 24 hours) at 09/17/2017 0741 Last data filed at 09/17/2017 0700 Gross per 24 hour  Intake 2914.96 ml  Output 985 ml  Net 1929.96 ml   Weight change: -4.7 kg (-10 lb 5.8 oz)  Physical Exam: Gen: Intubated, awake/tracks with her eyes CVS: Pulse regular rhythm, normal rate, S1 and S2 normal Resp: Mechanical breath sounds bilaterally, no distinct rales or rhonchi Abd: Soft, obese, nontender Ext: Left arm with  dressing/wound VAC, right arm in dressing, s/p R BKA  Imaging: Dg Chest Port 1 View  Result Date: 09/17/2017 CLINICAL DATA:  Shortness of Breath EXAM: PORTABLE CHEST 1 VIEW COMPARISON:  09/22/2017 FINDINGS: The cardiac shadow is mildly enlarged. Endotracheal tube and nasogastric catheter is well as a right jugular central line again seen and stable. No pneumothorax is noted. Significant patient rotation is noted accentuating the mediastinal markings. Central vascular congestion is again seen and stable. Small right-sided pleural effusion is noted. The left-sided pleural effusion is not well appreciated on this exam. IMPRESSION: Stable vascular congestion. Tubes and lines as described. Small right pleural effusion. Electronically Signed   By: Inez Catalina M.D.   On: 09/17/2017 07:04   Dg Chest Port 1 View  Result Date: 09/09/2017 CLINICAL DATA:  Respiratory failure, intubated patient. History of asthma, recent debridement of the great toe and the wrists. EXAM: PORTABLE CHEST 1 VIEW COMPARISON:  Chest x-ray of September 15, 2017 FINDINGS: The lungs are reasonably well inflated. There small bilateral pleural effusions layering posteriorly. The interstitial markings are increased. The cardiac silhouette is enlarged and the pulmonary vascularity engorged. The endotracheal tube tip lies 5.1 cm above the carina. The esophagogastric tube tip in proximal port project below the inferior margin of the image. The dual-lumen dialysis catheter tip projects over the distal third of the SVC. IMPRESSION: Stable appearance of the chest since yesterday's study. Mild pulmonary edema is secondary to CHF. No focal infiltrates. Small bilateral pleural effusions. Electronically Signed   By: David  Martinique M.D.   On: 09/27/2017 06:56    Labs: BMET Recent Labs  Lab 09/12/17 1600 09/13/17  5500 09/13/17 1633 10/05/2017 0313 09/15/17 0408 09/15/17 1452 09/27/2017 0500 10/01/2017 1731 09/17/17 0540  NA 136 135 134* 133* 132* 132*  131* 137 133*  K 3.7 3.6 3.9 4.1 4.5 4.7 5.7* 4.7 5.1  CL 103 100* 100* 99* 100* 99* 101  --  99*  CO2 _0 18* 21* 19*  --  23  GLUCOSE 159* 142* 157* 130* 117* 108* 112* 89 118*  BUN 42* 31* 23* 18 29* 32* 42*  --  36*  CREATININE 3.12* 2.38* 1.89* 1.65* 2.74* 3.14* 4.06*  --  3.13*  CALCIUM 8.1* 8.0* 7.9* 7.8* 7.5* 7.7* 7.5*  --  7.5*  PHOS 4.3 3.4 3.3 3.4  3.4 3.7  --  7.0*  --  6.6*   CBC Recent Labs  Lab 09/09/2017 0313 09/15/17 0408 09/10/2017 0335 09/24/2017 1731 09/17/17 0540  WBC 15.2* 16.2* 15.4*  --  14.2*  HGB 6.8* 7.0* 7.9* 7.5* 8.8*  HCT 20.7* 20.7* 25.5* 22.0* 27.3*  MCV 69.0* 72.1* 75.9*  --  79.8  PLT 197 153 178  --  160    Medications:    . amiodarone  200 mg Oral BID  . chlorhexidine gluconate (MEDLINE KIT)  15 mL Mouth Rinse BID  . Chlorhexidine Gluconate Cloth  6 each Topical Daily  . docusate  100 mg Per Tube BID  . feeding supplement (PRO-STAT SUGAR FREE 64)  60 mL Per Tube TID  . feeding supplement (VITAL HIGH PROTEIN)  1,000 mL Per Tube Q24H  . insulin aspart  0-15 Units Subcutaneous Q4H  . lactulose  10 g Oral Daily  . mouth rinse  15 mL Mouth Rinse 10 times per day  . pantoprazole (PROTONIX) IV  40 mg Intravenous Q24H  . sennosides  5 mL Per Tube Daily  . sodium chloride flush  3 mL Intravenous Q12H   Elmarie Shiley, MD 09/17/2017, 7:41 AM

## 2017-09-17 NOTE — Progress Notes (Signed)
ANTICOAGULATION CONSULT NOTE - Follow Up Consult  Pharmacy Consult for heparin Indication: atrial fibrillation  Allergies  Allergen Reactions  . Bactrim [Sulfamethoxazole-Trimethoprim] Hives  . Cefuroxime Axetil Other (See Comments)    Blistering wounds per family  . Cephalosporins Other (See Comments)    Blistering wounds as an adult per family  . Lisinopril Cough  . Penicillins Hives  . Iohexol Itching and Rash     Code: RASH, Desc: HAD ITCHING AND A RASH ABOUT ONE HOUR AFTER RETURNING HOME FROM THE CT, Onset Date: [id number]   . Sulfa Antibiotics Hives    Patient Measurements: Height: 5\' 2"  (157.5 cm) Weight: 277 lb 12.5 oz (126 kg) IBW/kg (Calculated) : 50.1 Heparin Dosing Weight: 81 kg  Vital Signs: Temp: 99.4 F (37.4 C) (04/10 1923) Temp Source: Oral (04/10 1923) BP: 94/44 (04/10 2100) Pulse Rate: 78 (04/10 2100)  Labs: Recent Labs    09/15/17 0408  09/15/17 1452 09/15/17 2049 09/15/2017 0335 10/06/2017 0500 09/28/2017 1731 09/17/17 0540 09/17/17 2056  HGB 7.0*  --   --   --  7.9*  --  7.5* 8.8*  --   HCT 20.7*  --   --   --  25.5*  --  22.0* 27.3*  --   PLT 153  --   --   --  178  --   --  160  --   HEPARINUNFRC  --    < >  --  0.24* <0.10*  --   --   --  0.50  CREATININE 2.74*  --  3.14*  --   --  4.06*  --  3.13*  --   CKTOTAL 102  --   --   --   --   --   --  439*  --    < > = values in this interval not displayed.    Estimated Creatinine Clearance: 25.8 mL/min (A) (by C-G formula based on SCr of 3.13 mg/dL (H)).   Assessment: 40 yoF with disseminated MSSA and rapid AFib now s/p DCCV on 4/4. Pt has been on heparin intermittently in between holding for surgeries. Heparin level therapeutic at 0.5 tonight on 2050 units/hr. CBC stable. No bleed documented.  RN called on evening shift and states procedure now scheduled for 0750 tomorrow and wanted stop time entered. Discussed with Dr. Marcelino Scot, heparin to be off at [place] on 4/11. Entered stop time.   Goal of  Therapy:  Heparin level 0.3-0.7 units/ml Monitor platelets by anticoagulation protocol: Yes   Plan:  -Continue heparin at 2050 units/hr -8-hr heparin level to confirm -Monitor daily heparin level and CBC, s/sx bleeding -Heparin off 4/11 at 0630 prior to procedure per Dr. Tawana Scale, PharmD, BCPS Clinical Pharmacist 09/17/2017 9:55 PM

## 2017-09-18 ENCOUNTER — Encounter (HOSPITAL_COMMUNITY): Admission: EM | Disposition: E | Payer: Self-pay | Source: Home / Self Care | Attending: Internal Medicine

## 2017-09-18 ENCOUNTER — Ambulatory Visit: Payer: Medicare Other

## 2017-09-18 ENCOUNTER — Other Ambulatory Visit: Payer: Medicare Other

## 2017-09-18 ENCOUNTER — Inpatient Hospital Stay (HOSPITAL_COMMUNITY): Payer: Medicare Other | Admitting: Anesthesiology

## 2017-09-18 ENCOUNTER — Encounter (HOSPITAL_COMMUNITY): Payer: Self-pay | Admitting: Anesthesiology

## 2017-09-18 DIAGNOSIS — L02416 Cutaneous abscess of left lower limb: Secondary | ICD-10-CM

## 2017-09-18 DIAGNOSIS — B9689 Other specified bacterial agents as the cause of diseases classified elsewhere: Secondary | ICD-10-CM

## 2017-09-18 DIAGNOSIS — M71121 Other infective bursitis, right elbow: Secondary | ICD-10-CM

## 2017-09-18 DIAGNOSIS — Z89212 Acquired absence of left upper limb below elbow: Secondary | ICD-10-CM

## 2017-09-18 DIAGNOSIS — M71122 Other infective bursitis, left elbow: Secondary | ICD-10-CM

## 2017-09-18 HISTORY — PX: DRESSING CHANGE UNDER ANESTHESIA: SHX5237

## 2017-09-18 HISTORY — PX: AMPUTATION: SHX166

## 2017-09-18 HISTORY — PX: INCISION AND DRAINAGE OF WOUND: SHX1803

## 2017-09-18 HISTORY — PX: IRRIGATION AND DEBRIDEMENT ABSCESS: SHX5252

## 2017-09-18 HISTORY — PX: IRRIGATION AND DEBRIDEMENT FOOT: SHX6602

## 2017-09-18 HISTORY — PX: I&D EXTREMITY: SHX5045

## 2017-09-18 LAB — CBC
HCT: 23.1 % — ABNORMAL LOW (ref 36.0–46.0)
HEMOGLOBIN: 7.5 g/dL — AB (ref 12.0–15.0)
MCH: 26.6 pg (ref 26.0–34.0)
MCHC: 32.5 g/dL (ref 30.0–36.0)
MCV: 81.9 fL (ref 78.0–100.0)
Platelets: 213 10*3/uL (ref 150–400)
RBC: 2.82 MIL/uL — ABNORMAL LOW (ref 3.87–5.11)
RDW: 20.1 % — AB (ref 11.5–15.5)
WBC: 16.7 10*3/uL — ABNORMAL HIGH (ref 4.0–10.5)

## 2017-09-18 LAB — RENAL FUNCTION PANEL
ALBUMIN: 1.2 g/dL — AB (ref 3.5–5.0)
Anion gap: 9 (ref 5–15)
BUN: 28 mg/dL — AB (ref 6–20)
CO2: 26 mmol/L (ref 22–32)
CREATININE: 2.51 mg/dL — AB (ref 0.44–1.00)
Calcium: 7.4 mg/dL — ABNORMAL LOW (ref 8.9–10.3)
Chloride: 97 mmol/L — ABNORMAL LOW (ref 101–111)
GFR calc Af Amer: 24 mL/min — ABNORMAL LOW (ref >60)
GFR calc non Af Amer: 20 mL/min — ABNORMAL LOW (ref >60)
GLUCOSE: 112 mg/dL — AB (ref 65–99)
POTASSIUM: 3.7 mmol/L (ref 3.5–5.1)
Phosphorus: 4 mg/dL (ref 2.5–4.6)
SODIUM: 132 mmol/L — AB (ref 135–145)

## 2017-09-18 LAB — GLUCOSE, CAPILLARY
GLUCOSE-CAPILLARY: 111 mg/dL — AB (ref 65–99)
GLUCOSE-CAPILLARY: 60 mg/dL — AB (ref 65–99)
GLUCOSE-CAPILLARY: 90 mg/dL (ref 65–99)
GLUCOSE-CAPILLARY: 111 mg/dL — AB (ref 65–99)
Glucose-Capillary: 100 mg/dL — ABNORMAL HIGH (ref 65–99)
Glucose-Capillary: 84 mg/dL (ref 65–99)
Glucose-Capillary: 83 mg/dL (ref 65–99)

## 2017-09-18 LAB — HEPARIN LEVEL (UNFRACTIONATED): HEPARIN UNFRACTIONATED: 0.23 [IU]/mL — AB (ref 0.30–0.70)

## 2017-09-18 LAB — MAGNESIUM: Magnesium: 1.8 mg/dL (ref 1.7–2.4)

## 2017-09-18 SURGERY — IRRIGATION AND DEBRIDEMENT EXTREMITY
Anesthesia: General | Site: Leg Lower | Laterality: Right

## 2017-09-18 MED ORDER — PHENYLEPHRINE HCL 10 MG/ML IJ SOLN
INTRAVENOUS | Status: DC | PRN
  Administered 2017-09-18: 40 ug/min via INTRAVENOUS
  Administered 2017-09-18: 80 ug/min via INTRAVENOUS

## 2017-09-18 MED ORDER — PROPOFOL 10 MG/ML IV BOLUS
INTRAVENOUS | Status: AC
  Filled 2017-09-18: qty 20

## 2017-09-18 MED ORDER — FENTANYL CITRATE (PF) 250 MCG/5ML IJ SOLN
INTRAMUSCULAR | Status: AC
  Filled 2017-09-18: qty 5

## 2017-09-18 MED ORDER — LACTATED RINGERS IV SOLN: INTRAVENOUS | Status: DC | PRN

## 2017-09-18 MED ORDER — ROCURONIUM BROMIDE 100 MG/10ML IV SOLN
INTRAVENOUS | Status: DC | PRN
  Administered 2017-09-18: 30 mg via INTRAVENOUS
  Administered 2017-09-18: 20 mg via INTRAVENOUS

## 2017-09-18 MED ORDER — DEXMEDETOMIDINE HCL IN NACL 400 MCG/100ML IV SOLN
INTRAVENOUS | Status: DC | PRN
  Administered 2017-09-18: 0.7 ug/kg/h via INTRAVENOUS

## 2017-09-18 MED ORDER — 0.9 % SODIUM CHLORIDE (POUR BTL) OPTIME
TOPICAL | Status: DC | PRN
  Administered 2017-09-18 (×2): 1000 mL

## 2017-09-18 MED ORDER — SODIUM CHLORIDE 0.9 % IR SOLN
Status: DC | PRN
  Administered 2017-09-18 (×2): 3000 mL

## 2017-09-18 MED ORDER — SODIUM CHLORIDE 0.9 % IV SOLN
INTRAVENOUS | Status: DC | PRN
  Administered 2017-09-18: 300 ug/h via INTRAVENOUS

## 2017-09-18 MED ORDER — HEPARIN (PORCINE) IN NACL 100-0.45 UNIT/ML-% IJ SOLN: 2050.0000 [IU]/h | INTRAMUSCULAR | Status: DC

## 2017-09-18 MED ORDER — HEPARIN (PORCINE) IN NACL 100-0.45 UNIT/ML-% IJ SOLN
2800.0000 [IU]/h | INTRAMUSCULAR | Status: DC
  Administered 2017-09-18 – 2017-09-19 (×2): 2050 [IU]/h via INTRAVENOUS
  Administered 2017-09-19: 2200 [IU]/h via INTRAVENOUS
  Administered 2017-09-19: 2600 [IU]/h via INTRAVENOUS
  Administered 2017-09-20: 2800 [IU]/h via INTRAVENOUS
  Filled 2017-09-18 (×8): qty 250

## 2017-09-18 MED ORDER — MIDAZOLAM HCL 2 MG/2ML IJ SOLN
INTRAMUSCULAR | Status: AC
  Filled 2017-09-18: qty 2

## 2017-09-18 MED ORDER — CIPROFLOXACIN IN D5W 400 MG/200ML IV SOLN
400.0000 mg | INTRAVENOUS | Status: DC
  Administered 2017-09-18 – 2017-09-19 (×2): 400 mg via INTRAVENOUS
  Filled 2017-09-18 (×2): qty 200

## 2017-09-18 SURGICAL SUPPLY — 55 items
BANDAGE ACE 3X5.8 VEL STRL LF (GAUZE/BANDAGES/DRESSINGS) ×2 IMPLANT
BANDAGE ELASTIC 3 VELCRO ST LF (GAUZE/BANDAGES/DRESSINGS) ×3 IMPLANT
BANDAGE ELASTIC 4 VELCRO ST LF (GAUZE/BANDAGES/DRESSINGS) ×4 IMPLANT
BANDAGE ELASTIC 6 VELCRO ST LF (GAUZE/BANDAGES/DRESSINGS) ×8 IMPLANT
BNDG COHESIVE 4X5 TAN STRL (GAUZE/BANDAGES/DRESSINGS) ×6 IMPLANT
BNDG CONFORM 2 STRL LF (GAUZE/BANDAGES/DRESSINGS) ×6 IMPLANT
BNDG GAUZE ELAST 4 BULKY (GAUZE/BANDAGES/DRESSINGS) ×20 IMPLANT
BRUSH SCRUB SURG 4.25 DISP (MISCELLANEOUS) ×32 IMPLANT
CONT SPEC 4OZ CLIKSEAL STRL BL (MISCELLANEOUS) ×4 IMPLANT
COVER SURGICAL LIGHT HANDLE (MISCELLANEOUS) ×16 IMPLANT
DRAPE EXTREMITY TIBURON (DRAPES) ×6 IMPLANT
DRAPE U-SHAPE 47X51 STRL (DRAPES) ×12 IMPLANT
DRSG ADAPTIC 3X8 NADH LF (GAUZE/BANDAGES/DRESSINGS) ×6 IMPLANT
DRSG MEPITEL 4X7.2 (GAUZE/BANDAGES/DRESSINGS) ×9 IMPLANT
ELECT REM PT RETURN 9FT ADLT (ELECTROSURGICAL) ×6
ELECTRODE REM PT RTRN 9FT ADLT (ELECTROSURGICAL) ×1 IMPLANT
GAUZE SPONGE 4X4 12PLY STRL (GAUZE/BANDAGES/DRESSINGS) ×21 IMPLANT
GAUZE SPONGE 4X4 16PLY XRAY LF (GAUZE/BANDAGES/DRESSINGS) IMPLANT
GLOVE BIO SURGEON STRL SZ7.5 (GLOVE) ×21 IMPLANT
GLOVE BIO SURGEON STRL SZ8 (GLOVE) ×21 IMPLANT
GLOVE BIOGEL PI IND STRL 7.5 (GLOVE) ×7 IMPLANT
GLOVE BIOGEL PI IND STRL 8 (GLOVE) ×8 IMPLANT
GLOVE BIOGEL PI INDICATOR 7.5 (GLOVE) ×8
GLOVE BIOGEL PI INDICATOR 8 (GLOVE) ×10
GOWN STRL REUS W/ TWL LRG LVL3 (GOWN DISPOSABLE) ×8 IMPLANT
GOWN STRL REUS W/ TWL XL LVL3 (GOWN DISPOSABLE) ×7 IMPLANT
GOWN STRL REUS W/TWL LRG LVL3 (GOWN DISPOSABLE) ×12
GOWN STRL REUS W/TWL XL LVL3 (GOWN DISPOSABLE) ×24
IMMOBILIZER KNEE 22 UNIV (SOFTGOODS) ×2 IMPLANT
KIT BASIN OR (CUSTOM PROCEDURE TRAY) ×8 IMPLANT
KIT TURNOVER KIT B (KITS) ×6 IMPLANT
MANIFOLD NEPTUNE II (INSTRUMENTS) ×6 IMPLANT
NS IRRIG 1000ML POUR BTL (IV SOLUTION) ×10 IMPLANT
PACK ORTHO EXTREMITY (CUSTOM PROCEDURE TRAY) ×6 IMPLANT
PAD ABD 8X10 STRL (GAUZE/BANDAGES/DRESSINGS) ×32 IMPLANT
PAD ARMBOARD 7.5X6 YLW CONV (MISCELLANEOUS) ×12 IMPLANT
PAD CAST 4YDX4 CTTN HI CHSV (CAST SUPPLIES) IMPLANT
PADDING CAST COTTON 4X4 STRL (CAST SUPPLIES) ×6
PADDING CAST COTTON 6X4 STRL (CAST SUPPLIES) ×6 IMPLANT
SET CYSTO W/LG BORE CLAMP LF (SET/KITS/TRAYS/PACK) ×6 IMPLANT
SPLINT FINGER (SOFTGOODS) ×3 IMPLANT
SPONGE LAP 18X18 X RAY DECT (DISPOSABLE) ×14 IMPLANT
STOCKINETTE IMPERVIOUS 9X36 MD (GAUZE/BANDAGES/DRESSINGS) ×3 IMPLANT
STOCKINETTE TUBULAR SYNTH 3IN (CAST SUPPLIES) ×2 IMPLANT
SUT ETHILON 2 0 FS 18 (SUTURE) ×12 IMPLANT
SUT PDS AB 2-0 CT1 27 (SUTURE) ×6 IMPLANT
SUT PROLENE 2 0 CT 1 (SUTURE) ×6 IMPLANT
SWAB CULTURE ESWAB REG 1ML (MISCELLANEOUS) ×6 IMPLANT
TOWEL OR 17X24 6PK STRL BLUE (TOWEL DISPOSABLE) ×8 IMPLANT
TOWEL OR 17X26 10 PK STRL BLUE (TOWEL DISPOSABLE) ×12 IMPLANT
TUBE CONNECTING 12'X1/4 (SUCTIONS) ×3
TUBE CONNECTING 12X1/4 (SUCTIONS) ×7 IMPLANT
UNDERPAD 30X30 (UNDERPADS AND DIAPERS) ×21 IMPLANT
WATER STERILE IRR 1000ML POUR (IV SOLUTION) ×6 IMPLANT
YANKAUER SUCT BULB TIP NO VENT (SUCTIONS) ×14 IMPLANT

## 2017-09-18 NOTE — Anesthesia Postprocedure Evaluation (Signed)
Anesthesia Post Note  Patient: Jessica Howard  Procedure(s) Performed: IRRIGATION AND DEBRIDEMENT OF ELBOW AND WRIST (Left Arm Lower) IRRIGATION AND DEBRIDEMENT ankle (Left Leg Lower) IRRIGATION AND DEBRIDEMENT right elbow (Right Arm Lower) IRRIGATION AND DEBRIDEMENT right hand and thumb (Right Arm Lower) AMPUTATION HAND (Left Arm Lower) DRESSING CHANGE UNDER ANESTHESIA, right lower residual limb (Right Leg Lower)     Patient location during evaluation: SICU Anesthesia Type: General Level of consciousness: sedated and patient remains intubated per anesthesia plan Pain management: pain level controlled Vital Signs Assessment: post-procedure vital signs reviewed and stable Respiratory status: patient remains intubated per anesthesia plan Cardiovascular status: stable Postop Assessment: no apparent nausea or vomiting Anesthetic complications: no    Last Vitals:  Vitals:   09/22/2017 1253 10/06/2017 1300  BP:  (!) 127/55  Pulse:  66  Resp:  (!) 24  Temp: 37 C   SpO2:  100%    Last Pain:  Vitals:   09/28/2017 1253  TempSrc: Oral  PainSc:                  Scot Shiraishi,JAMES TERRILL

## 2017-09-18 NOTE — Progress Notes (Signed)
Pharmacy Antibiotic Note  Jessica Howard is a 57 y.o. female admitted on 08/20/2017 with MSSA bacteremia.  Pharmacy has been consulted for daptomycin dosing. Pt now s/p OR x2 for I&D of septic joints. TEE in OR could not exclude endocarditis. Pt to return for further washout today and possible BKA. CK over the weekend was elevated but is now wnl. Pt off CRRT and likely to undergo iHD today per renal. Recent concern for septic emboli to lungs, per ID continue daptomycin for now.  Gram neg rods seen on gram stain   Plan: -add cipro 400 q24h Continue daptomycin 8mg /kg q48h -Monitor CK closely  Height: 5\' 2"  (157.5 cm) Weight: 283 lb 8.2 oz (128.6 kg) IBW/kg (Calculated) : 50.1  Temp (24hrs), Avg:99.6 F (37.6 C), Min:98.5 F (36.9 C), Max:100.9 F (38.3 C)  Recent Labs  Lab 09/27/2017 0313 09/15/17 0408 09/15/17 1452 09/30/2017 0335 10/03/2017 0500 09/17/17 0540 09/26/2017 0515  WBC 15.2* 16.2*  --  15.4*  --  14.2* 16.7*  CREATININE 1.65* 2.74* 3.14*  --  4.06* 3.13* 2.51*    Estimated Creatinine Clearance: 32.6 mL/min (A) (by C-G formula based on SCr of 2.51 mg/dL (H)).    Allergies  Allergen Reactions  . Bactrim [Sulfamethoxazole-Trimethoprim] Hives  . Cefuroxime Axetil Other (See Comments)    Blistering wounds per family  . Cephalosporins Other (See Comments)    Blistering wounds as an adult per family  . Lisinopril Cough  . Penicillins Hives  . Iohexol Itching and Rash     Code: RASH, Desc: HAD ITCHING AND A RASH ABOUT ONE HOUR AFTER RETURNING HOME FROM THE CT, Onset Date: [id number]   . Sulfa Antibiotics Hives   Levester Fresh, PharmD, BCPS, BCCCP Clinical Pharmacist Clinical phone for 10/07/2017 from 1430 [phone]: [phone] If after 2300, please call main pharmacy at: [phone] 09/17/2017 4:35 PM

## 2017-09-18 NOTE — Progress Notes (Signed)
PULMONARY  / CRITICAL CARE MEDICINE  Name: Jessica Howard MRN: [id number] DOB: [birth year]    LOS: 21  REFERRING MD :  Velvet Bathe MD  CHIEF COMPLAINT:  Back Pain   BRIEF PATIENT DESCRIPTION: Patient is a 57 y.o female with RA on immunosuppressive therapy (Levunomide and Abatacept) and DM who presented to the ED with 2 weeks of progressive myalgias and arthralgias. Initial work-up was significant for AKI, HAGMA, elevated liver enzymes, lactic acidosis, leukocytosis, and acute on chronic anemia. Blood cultures subsequently grew MSSA (4/4). Initial TTE was negative for features of endocarditis; however, patient has pathologic PE findings including splinter hemorrhages and Olser nodes. Her renal function worsened over the course of her hospitalization and she is requiring CRRT. The patient also converted to atrial fibrillation and was placed on amiodarone, diltiazem, and lopressor. MRI of the right foot illustrated septic arthritis of the right first IP with associated osteomyelitis. MRI of the lumbar spine showed septic arthritis of the right L1-2 facet joint with associated fluid collection. The patient went for I&D on 4/4 and we obtained a TEE that could not exclude endocarditis. She was successfully cardioverted at that time. Since has gone back for another I&D on 4/7, and right BKA on 4/9.  LINES / TUBES: Right peripheral IV  Right IJ tunneled HD catheter  Urinary catheter Penrose rain right hand  Wound VAC left hand  Endotracheal tube  NG tube   CULTURES: - 4/4 blood cultures on 3/29 for MSSA - Urine cultures on 3/29 growing MSSA - 1/2 Blood cultures on 3/30 with MSSA - Right toe superficial wound culture with staph aureus  - Left wrist synovial fluid with gram positive cocci  - Blood Cultures 4/4 with 1/2 positive - Blood cultures 4/9 NGTD   ANTIBIOTICS: - Vancomycin 3/29 -> discontinued on 3/30  - Aztreonam 3/29 -> discontinued on 3/30  - Flagyl 3/29 -> discontinued on 3/30   - Daptomycin 3/30 -> current  SIGNIFICANT EVENTS: Admitted for MSSA bacteremia 3/29 >>> Hypotensive and started on CRRT 3/31 >>> Converted to Atrial Fibrillation 4/1 >>> I&D of right wrist, right foot, and left wrist 4/4 >>> TEE with cardioversion 4/4 >>> Repeat blood cultures 1 of 2 positive on 4/4 >>> CRRT stopped 4/4 >>> Hypotension requiring pressors (neo) 4/4 >>> Left on Vent 4/4 >>> IR drainage of right L1-2 facet abscess 4/5 >>> CRRT restarted 4/5 >>> CRRT stopped 4/7 >>> I&D of right wrist, right foot, and left wrist. Amputation of right 1st ray and right second digit at DIP 4/7 >>> Hypotensive requiring pressor support with Neo 4/8 >>> Right BKA 4/9 >>>  INTERVAL HISTORY:  No major events overnight.  Fever curve and WBC trending down.  Plan to go back to the OR today.   Continue discussion with family. Poor prognosis.   VITAL SIGNS: Temp:  [99.4 F (37.4 C)-102.2 F (39 C)] 99.5 F (37.5 C) (04/11 0400) Pulse Rate:  [73-96] 73 (04/11 0700) Resp:  [8-21] 18 (04/11 0700) BP: (69-134)/(35-77) 114/54 (04/11 0700) SpO2:  [97 %-100 %] 100 % (04/11 0700) FiO2 (%):  [40 %] 40 % (04/11 0600) Weight:  [277 lb 12.5 oz (126 kg)-283 lb 8.2 oz (128.6 kg)] 283 lb 8.2 oz (128.6 kg) (04/11 0500)  INTAKE / OUTPUT: Intake/Output      04/10 0701 - 04/11 0700 04/11 0701 - 04/12 0700   I.V. (mL/kg) 1609.3 (12.5)    Blood     NG/GT 260    IV Piggyback 100  Total Intake(mL/kg) 1969.3 (15.3)    Urine (mL/kg/hr) 70 (0)    Other 2000    Blood     Total Output 2070    Net -100.8           PHYSICAL EXAMINATION:  General: Obese female, sedated  Neuro: Sedated but opens eyes to stimuli, does not track  HEENT: Normocephalic, atraumatic, moist mucus membranes Cardiovascular: RRR, no murmurs, no rubs Lungs: Good air movement with no wheezing or crackles  Abdomen: Active bowel sounds, soft, no tenderness to palpation  Musculoskeletal: Mild LE edema  Skin: Warm and dry    LABS: Cbc Recent Labs  Lab 10/02/2017 0335 09/09/2017 1731 09/17/17 0540 09/29/2017 0515  WBC 15.4*  --  14.2* 16.7*  HGB 7.9* 7.5* 8.8* 7.5*  HCT 25.5* 22.0* 27.3* 23.1*  PLT 178  --  160 213   Chemistry Recent Labs  Lab 09/21/2017 0335 10/04/2017 0500 09/30/2017 1731 09/17/17 0540 09/29/2017 0515  NA  --  131* 137 133* 132*  K  --  5.7* 4.7 5.1 3.7  CL  --  101  --  99* 97*  CO2  --  19*  --  23 26  BUN  --  42*  --  36* 28*  CREATININE  --  4.06*  --  3.13* 2.51*  CALCIUM  --  7.5*  --  7.5* 7.4*  MG 2.3  --   --  1.9 1.8  PHOS  --  7.0*  --  6.6* 4.0  GLUCOSE  --  112* 89 118* 112*   Liver fxn Recent Labs  Lab 09/12/17 0643  09/15/17 0408 09/09/2017 0500 09/17/17 0540 10/06/2017 0515  AST 49*  --  55*  --  52*  --   ALT 24  --  22  --  24  --   ALKPHOS 417*  --  519*  --  409*  --   BILITOT 3.8*  --  4.1*  --  5.0*  --   PROT 5.8*  --  5.7*  --  5.6*  --   ALBUMIN 1.6*  1.6*   < > 1.4*  1.5* 1.4* 1.3*  1.3* 1.2*   < > = values in this interval not displayed.   coags Recent Labs  Lab 09/12/17 0435  INR 1.52   Sepsis markers No results for input(s): LATICACIDVEN, PROCALCITON in the last 168 hours.   Cardiac markers Recent Labs  Lab 09/13/17 0439 09/15/17 0408 09/17/17 0540  CKTOTAL 441* 102 439*   BNP No results for input(s): PROBNP in the last 168 hours.   ABG Recent Labs  Lab 09/12/17 1037 09/09/2017 0607 09/15/17 0715  HCO3 22.6 25.1 22.0  TCO2 24 27 23    CBG trend Recent Labs  Lab 09/17/17 1605 09/17/17 1921 09/17/17 2322 09/30/2017 0326 09/30/2017 0706  GLUCAP 105* 84 105* 111* 100*   DIAGNOSES: Principal Problem:   MSSA bacteremia Active Problems:   Thalassemia   Rheumatoid arthritis ([place])   Morbid obesity (HCC)   Acute back pain   Essential hypertension   Diabetes ([place])   Dyslipidemia   Severe sepsis with septic shock ([place])   Acute kidney injury ([place])   Immunosuppression due to drug therapy   Rheumatoid arthritis(714.0)    Septic joint of left wrist ([place])   Osteomyelitis of great toe of right foot ([place])   Hypertension   Hyperlipidemia   Acute hearing loss of left ear   Diabetes mellitus, insulin dependent (IDDM), controlled ([place])  Beta thalassemia (HCC)   Acute anemia   Elevated liver enzymes   Hyperbilirubinemia   Altered mental status, unspecified   Atrial flutter (HCC)   Infection of right hand   Abscess   Acute renal failure (HCC)   Acute respiratory failure (HCC)   Encephalopathy   Endotracheally intubated   Staphylococcal arthritis of right wrist (HCC)   Facet joint disease   Pleural effusion   Left wrist pain   Goals of care, counseling/discussion  ASSESSMENT / PLAN: 57 y.o female with RA on immunosuppressive therapy (Levunomide and Abatacept) who presented to the ED on 3/29 with sepsis and multiorgan failure secondary to MSSA Bacteremia/Endocaridits.   Continuing GOC discussions with the family. Overall poor prognosis.   INFECTIOUS A: Sepsis secondary to MSSA Bacteremia/Endocarditis  MSSA septic arthritis, spinal abscess S/p right BKA on 4/9. In the OR it was noted that the infection appears to be spreading up the left arm.   P:   Infectious disease onboard, continuing daptomycin Orthopedic surgery onboard. Plan to go back to OR today at 0750. Currently on neo for pressure support. Continue to wean with goal MAP >65. Blood cultures from 4/9 with NGTD. Needs line holiday.   CARDIOVASCULAR A:  Hypotension Patient is hypertensive at baseline on bystolic, irbesartan, and spironolactone.  Atrial Fib 2/2 sepsis  TEE without evidence of large vegetation or thrombus, cardioverted on 4/4.  On amiodarone   P:  Cardiology signed off 4/8.  Currently in sinus rhythm. Holding heparin today as patient is going back to the OR. Currently on neo for pressure support. Continue to wean with goal MAP >65. Continue Amiodarone.  RENAL A: ARF secondary to MSSA bacteremia, blood clots in  catheter  Creatine elevated from baseline of 0.9 to 3.36 on admission  UA with hematuria, proteinuria, and bacteruria. Cultures growing Staph aureus.  Mg 1.8 PO4 4.0 K 3.7  P:   Nephrology onboard. HD per nephrology.  Patient remains oligouric. Maintain MAP >65 Needs line holiday.   PULMONARY A: Post surgical ventilation  Obstructive Sleep Apnea  Vent Mode: PRVC FiO2 (%):  [40 %] 40 % Set Rate:  [18 bmp] 18 bmp Vt Set:  [470 mL] 470 mL PEEP:  [5 cmH20] 5 cmH20 Plateau Pressure:  [21 [id number] cmH20] 21 cmH20   Intubate on 4/4, day #7  P:   RASS: -3 Current sedation: Fentanyl  VAP precautions Will wean sedation and work towards extubation s/p surgery today.   GASTROINTESTINAL A: Elevated LFTs ? Cholestatic liver injury  Nutrition  GI ppx  P:   Will continue to monitor LFTs  Continue GI ppx  Restart tube feeds after surgery today.   HEMATOLOGIC A:  Beta Thalassemia  Acute on chronic anemia secondary to sepsis Hgb dropped from baseline of ~10 to 7.6 on admission.  LDH elevated but haptoglobin elevated and reticulocyte WNL therefore, unlikely to be 2/2 hemolytic anemia.   P:  Hgb 7.5 Transfusion goals: Transfuse 1 units pRBCs if <7 or <8 if patient becomes hemodynamically unstable and/or has active bleeding   ENDOCRINE A: Insulin Dependent Type 2 DM On Humolin 75 units TID at home   P:   CBG goal 140-180  Continue SSI-moderate   NEUROLOGICAL A: Altered Mental Status  Multiple contributing factors including A-fib, sepsis, opiates CT head and ammonium WNL  P:  Wean sedation after surgery.   Pulmonary and [place] Pager: [phone]  10/03/2017, 7:16 AM

## 2017-09-18 NOTE — Progress Notes (Signed)
Subjective:  Intubated, sedated  Antibiotics:  Anti-infectives (From admission, onward)   Start     Dose/Rate Route Frequency Ordered Stop   09/17/2017 2100  DAPTOmycin (CUBICIN) 1,000 mg in sodium chloride 0.9 % IVPB  Status:  Discontinued     1,000 mg 240 mL/hr over 30 Minutes Intravenous Every 48 hours 09/17/2017 2033 09/28/2017 2047   10/06/2017 2047  DAPTOmycin (CUBICIN) 1,000 mg in sodium chloride 0.9 % IVPB     1,000 mg 240 mL/hr over 30 Minutes Intravenous Every 48 hours 09/24/2017 2047     09/25/2017 1800  DAPTOmycin (CUBICIN) 1,000 mg in sodium chloride 0.9 % IVPB  Status:  Discontinued     1,000 mg 240 mL/hr over 30 Minutes Intravenous Every 48 hours 09/27/2017 1103 09/19/2017 2033   09/08/17 1600  DAPTOmycin (CUBICIN) 1,000 mg in sodium chloride 0.9 % IVPB  Status:  Discontinued     1,000 mg 240 mL/hr over 30 Minutes Intravenous Every 48 hours 09/07/17 1019 10/03/2017 1103   09/07/17 1000  vancomycin (VANCOCIN) 1,500 mg in sodium chloride 0.9 % 500 mL IVPB  Status:  Discontinued     1,500 mg 250 mL/hr over 120 Minutes Intravenous Every 48 hours 08/23/2017 0906 09/06/17 0935   09/06/17 1600  DAPTOmycin (CUBICIN) 984 mg in sodium chloride 0.9 % IVPB  Status:  Discontinued     8 mg/kg  123 kg 239.4 mL/hr over 30 Minutes Intravenous Every 48 hours 09/06/17 1436 09/07/17 1019   09/06/17 1000  aztreonam (AZACTAM) 0.5 g in dextrose 5 % 50 mL IVPB  Status:  Discontinued     0.5 g 100 mL/hr over 30 Minutes Intravenous Every 8 hours 09/06/17 0943 09/06/17 1223   08/13/2017 2000  metroNIDAZOLE (FLAGYL) IVPB 500 mg  Status:  Discontinued     500 mg 100 mL/hr over 60 Minutes Intravenous Every 8 hours 08/20/2017 1343 09/06/17 0028   08/19/2017 0900  aztreonam (AZACTAM) 2 g in sodium chloride 0.9 % 100 mL IVPB  Status:  Discontinued     2 g 200 mL/hr over 30 Minutes Intravenous Every 8 hours 08/16/2017 0853 09/06/17 0028   08/26/2017 0900  metroNIDAZOLE (FLAGYL) IVPB 500 mg     500 mg 100 mL/hr  over 60 Minutes Intravenous  Once 08/12/2017 0853 08/15/2017 1035   09/06/2017 0830  piperacillin-tazobactam (ZOSYN) IVPB 3.375 g  Status:  Discontinued     3.375 g 100 mL/hr over 30 Minutes Intravenous  Once 08/19/2017 0816 08/29/2017 0836   08/19/2017 0830  vancomycin (VANCOCIN) IVPB 1000 mg/200 mL premix  Status:  Discontinued     1,000 mg 200 mL/hr over 60 Minutes Intravenous  Once 08/23/2017 0816 08/23/2017 0822   08/31/2017 0830  vancomycin (VANCOCIN) 2,000 mg in sodium chloride 0.9 % 500 mL IVPB     2,000 mg 250 mL/hr over 120 Minutes Intravenous  Once 08/22/2017 0822 08/08/2017 1342      Medications: Scheduled Meds: . amiodarone  200 mg Oral BID  . chlorhexidine gluconate (MEDLINE KIT)  15 mL Mouth Rinse BID  . Chlorhexidine Gluconate Cloth  6 each Topical Daily  . docusate  100 mg Per Tube BID  . feeding supplement (PRO-STAT SUGAR FREE 64)  60 mL Per Tube TID  . feeding supplement (VITAL HIGH PROTEIN)  1,000 mL Per Tube Q24H  . insulin aspart  0-15 Units Subcutaneous Q4H  . lactulose  10 g Oral Daily  . mouth rinse  15 mL Mouth Rinse 10  times per day  . mupirocin ointment  1 application Nasal BID  . pantoprazole (PROTONIX) IV  40 mg Intravenous Q24H  . sennosides  5 mL Per Tube Daily  . sodium chloride flush  3 mL Intravenous Q12H   Continuous Infusions: . sodium chloride 0  (09/30/2017 0730)  . DAPTOmycin (CUBICIN)  IV Stopped (09/25/2017 2135)  . fentaNYL infusion INTRAVENOUS 300 mcg/hr (09/17/2017 1428)  . heparin    . phenylephrine (NEO-SYNEPHRINE) Adult infusion 10 mcg/min (09/17/2017 1446)   PRN Meds:.acetaminophen **OR** acetaminophen, albuterol, fentaNYL, LORazepam, ondansetron **OR** ondansetron (ZOFRAN) IV    Objective: Weight change: -9 lb 4.2 oz (-4.2 kg)  Intake/Output Summary (Last 24 hours) at 09/13/2017 1603 Last data filed at 10/07/2017 1500 Gross per 24 hour  Intake 2571.3 ml  Output 2160 ml  Net 411.3 ml   Blood pressure (!) 111/56, pulse 70, temperature 98.6 F (37 C),  temperature source Oral, resp. rate 20, height _0  (1.575 m), weight 283 lb 8.2 oz (128.6 kg), last menstrual period 12/27/2013, SpO2 100 %. Temp:  [98.6 F (37 C)-100.9 F (38.3 C)] 98.6 F (37 C) (04/11 1253) Pulse Rate:  [63-83] 70 (04/11 1541) Resp:  [8-24] 20 (04/11 1541) BP: (69-127)/(25-70) 111/56 (04/11 1541) SpO2:  [93 %-100 %] 100 % (04/11 1542) FiO2 (%):  [40 %] 40 % (04/11 1542) Weight:  [277 lb 12.5 oz (126 kg)-283 lb 8.2 oz (128.6 kg)] 283 lb 8.2 oz (128.6 kg) (04/11 0500)  Physical Exam: General: Intubated  HEENT: anicteric sclera,EOMI intubated CVS regular rate, normal r,  no murmur rubs or gallops Chest: clear to auscultation anteriorly  abdomen: soft, nondistended, normal bowel sounds, Extremities/skin: Left UE amputation site bandaged RUE: bandage RLE BKA bandage LLE: ankle with wrapping Neuro: no new focal neuro deficits  CBC:    BMET Recent Labs    09/17/17 0540 10/05/2017 0515  NA 133* 132*  K 5.1 3.7  CL 99* 97*  CO2 23 26  GLUCOSE 118* 112*  BUN 36* 28*  CREATININE 3.13* 2.51*  CALCIUM 7.5* 7.4*     Liver Panel  Recent Labs    09/17/17 0540 10/07/2017 0515  PROT 5.6*  --   ALBUMIN 1.3*  1.3* 1.2*  AST 52*  --   ALT 24  --   ALKPHOS 409*  --   BILITOT 5.0*  --   BILIDIR 3.7*  --   IBILI 1.3*  --        Sedimentation Rate No results for input(s): ESRSEDRATE in the last 72 hours. C-Reactive Protein No results for input(s): CRP in the last 72 hours.  Micro Results: Recent Results (from the past 720 hour(s))  Blood Culture (routine x 2)     Status: Abnormal   Collection Time: 08/18/2017  9:50 AM  Result Value Ref Range Status   Specimen Description BLOOD RIGHT HAND  Final   Special Requests   Final    BOTTLES DRAWN AEROBIC AND ANAEROBIC Blood Culture adequate volume   Culture  Setup Time   Final    GRAM POSITIVE COCCI IN BOTH AEROBIC AND ANAEROBIC BOTTLES CRITICAL RESULT CALLED TO, READ BACK BY AND VERIFIED WITH: G  ABBOTT 09/06/17 0006 JDW    Culture (A)  Final    STAPHYLOCOCCUS AUREUS SUSCEPTIBILITIES PERFORMED ON PREVIOUS CULTURE WITHIN THE LAST 5 DAYS. Performed at [facility], [place] [address] [place], [place] [address]    Report Status 09/08/2017 FINAL  Final  Blood Culture (routine x 2)  Status: Abnormal   Collection Time: 08/29/2017  1:54 PM  Result Value Ref Range Status   Specimen Description BLOOD RIGHT FOREARM  Final   Special Requests   Final    BOTTLES DRAWN AEROBIC AND ANAEROBIC Blood Culture adequate volume   Culture  Setup Time   Final    GRAM POSITIVE COCCI IN BOTH AEROBIC AND ANAEROBIC BOTTLES CRITICAL RESULT CALLED TO, READ BACK BY AND VERIFIED WITH: Denton Brick PHARMD 09/06/17 0006 JDW Performed at [facility], [place] [address], [place], [place] [address]    Culture STAPHYLOCOCCUS AUREUS (A)  Final   Report Status 09/08/2017 FINAL  Final   Organism ID, Bacteria STAPHYLOCOCCUS AUREUS  Final      Susceptibility   Staphylococcus aureus - MIC*    CIPROFLOXACIN <=0.5 SENSITIVE Sensitive     ERYTHROMYCIN <=0.25 SENSITIVE Sensitive     GENTAMICIN <=0.5 SENSITIVE Sensitive     OXACILLIN 0.5 SENSITIVE Sensitive     TETRACYCLINE <=1 SENSITIVE Sensitive     VANCOMYCIN 1 SENSITIVE Sensitive     TRIMETH/SULFA <=10 SENSITIVE Sensitive     CLINDAMYCIN <=0.25 SENSITIVE Sensitive     RIFAMPIN <=0.5 SENSITIVE Sensitive     Inducible Clindamycin NEGATIVE Sensitive     * STAPHYLOCOCCUS AUREUS  Blood Culture ID Panel (Reflexed)     Status: Abnormal   Collection Time: 08/09/2017  1:54 PM  Result Value Ref Range Status   Enterococcus species NOT DETECTED NOT DETECTED Final   Listeria monocytogenes NOT DETECTED NOT DETECTED Final   Staphylococcus species DETECTED (A) NOT DETECTED Final    Comment: CRITICAL RESULT CALLED TO, READ BACK BY AND VERIFIED WITH: G ABBOTT PHARMD 09/06/17 0006 JDW    Staphylococcus aureus DETECTED (A) NOT DETECTED Final    Comment: Methicillin (oxacillin)  susceptible Staphylococcus aureus (MSSA). Preferred therapy is anti staphylococcal beta lactam antibiotic (Cefazolin or Nafcillin), unless clinically contraindicated. CRITICAL RESULT CALLED TO, READ BACK BY AND VERIFIED WITH: G ABBOTT PHARMD 09/06/17 0006 JDW    Methicillin resistance NOT DETECTED NOT DETECTED Final   Streptococcus species NOT DETECTED NOT DETECTED Final   Streptococcus agalactiae NOT DETECTED NOT DETECTED Final   Streptococcus pneumoniae NOT DETECTED NOT DETECTED Final   Streptococcus pyogenes NOT DETECTED NOT DETECTED Final   Acinetobacter baumannii NOT DETECTED NOT DETECTED Final   Enterobacteriaceae species NOT DETECTED NOT DETECTED Final   Enterobacter cloacae complex NOT DETECTED NOT DETECTED Final   Escherichia coli NOT DETECTED NOT DETECTED Final   Klebsiella oxytoca NOT DETECTED NOT DETECTED Final   Klebsiella pneumoniae NOT DETECTED NOT DETECTED Final   Proteus species NOT DETECTED NOT DETECTED Final   Serratia marcescens NOT DETECTED NOT DETECTED Final   Haemophilus influenzae NOT DETECTED NOT DETECTED Final   Neisseria meningitidis NOT DETECTED NOT DETECTED Final   Pseudomonas aeruginosa NOT DETECTED NOT DETECTED Final   Candida albicans NOT DETECTED NOT DETECTED Final   Candida glabrata NOT DETECTED NOT DETECTED Final   Candida krusei NOT DETECTED NOT DETECTED Final   Candida parapsilosis NOT DETECTED NOT DETECTED Final   Candida tropicalis NOT DETECTED NOT DETECTED Final    Comment: Performed at [facility], [place] [address], [place], [place] [address]  Urine culture     Status: Abnormal   Collection Time: 08/12/2017  3:56 PM  Result Value Ref Range Status   Specimen Description URINE, RANDOM  Final   Special Requests   Final    NONE Performed at [facility], [place]  [address], [place], [place] [address]    Culture 50,000 COLONIES/mL STAPHYLOCOCCUS AUREUS (A)  Final   Report Status 09/07/2017 FINAL  Final   Organism ID, Bacteria STAPHYLOCOCCUS  AUREUS (A)  Final      Susceptibility   Staphylococcus aureus - MIC*    CIPROFLOXACIN <=0.5 SENSITIVE Sensitive     GENTAMICIN <=0.5 SENSITIVE Sensitive     NITROFURANTOIN 32 SENSITIVE Sensitive     OXACILLIN 0.5 SENSITIVE Sensitive     TETRACYCLINE <=1 SENSITIVE Sensitive     VANCOMYCIN 1 SENSITIVE Sensitive     TRIMETH/SULFA <=10 SENSITIVE Sensitive     CLINDAMYCIN <=0.25 SENSITIVE Sensitive     RIFAMPIN <=0.5 SENSITIVE Sensitive     Inducible Clindamycin NEGATIVE Sensitive     * 50,000 COLONIES/mL STAPHYLOCOCCUS AUREUS  MRSA PCR Screening     Status: None   Collection Time: 09/06/17  5:20 AM  Result Value Ref Range Status   MRSA by PCR NEGATIVE NEGATIVE Final    Comment:        The GeneXpert MRSA Assay (FDA approved for NASAL specimens only), is one component of a comprehensive MRSA colonization surveillance program. It is not intended to diagnose MRSA infection nor to guide or monitor treatment for MRSA infections. Performed at [facility], [place] [address] [place], [place] [address]   Culture, blood (routine x 2)     Status: Abnormal   Collection Time: 09/07/17  7:09 AM  Result Value Ref Range Status   Specimen Description BLOOD RIGHT HAND  Final   Special Requests AEROBIC BOTTLE ONLY Blood Culture adequate volume  Final   Culture  Setup Time   Final    GRAM POSITIVE COCCI AEROBIC BOTTLE ONLY CRITICAL VALUE NOTED.  VALUE IS CONSISTENT WITH PREVIOUSLY REPORTED AND CALLED VALUE.    Culture (A)  Final    STAPHYLOCOCCUS AUREUS SUSCEPTIBILITIES PERFORMED ON PREVIOUS CULTURE WITHIN THE LAST 5 DAYS. Performed at [facility], [place] [address], [place], [place] [address]    Report Status 09/09/2017 FINAL  Final  Culture, blood (routine x 2)     Status: None   Collection Time: 09/07/17  7:17 AM  Result Value Ref Range Status   Specimen Description BLOOD LEFT ANTECUBITAL  Final   Special Requests   Final    IN BOTH AEROBIC AND ANAEROBIC BOTTLES Blood Culture  results may not be optimal due to an excessive volume of blood received in culture bottles   Culture   Final    NO GROWTH 5 DAYS Performed at [facility], [place] [address] [place], [place] [address]    Report Status 09/12/2017 FINAL  Final  Aerobic Culture (superficial specimen)     Status: None   Collection Time: 09/08/17  3:10 PM  Result Value Ref Range Status   Specimen Description WOUND RIGHT TOE  Final   Special Requests   Final    NONE Performed at [facility], [place] [address], [place], [place] [address]    Gram Stain   Final    RARE WBC PRESENT, PREDOMINANTLY MONONUCLEAR FEW GRAM POSITIVE COCCI ABUNDANT SQUAMOUS EPITHELIAL CELLS PRESENT    Culture ABUNDANT STAPHYLOCOCCUS AUREUS  Final   Report Status 09/10/2017 FINAL  Final   Organism ID, Bacteria STAPHYLOCOCCUS AUREUS  Final      Susceptibility   Staphylococcus aureus - MIC*    CIPROFLOXACIN <=0.5 SENSITIVE Sensitive     ERYTHROMYCIN <=0.25 SENSITIVE Sensitive     GENTAMICIN <=0.5 SENSITIVE  Sensitive     OXACILLIN 0.5 SENSITIVE Sensitive     TETRACYCLINE <=1 SENSITIVE Sensitive     VANCOMYCIN <=0.5 SENSITIVE Sensitive     TRIMETH/SULFA <=10 SENSITIVE Sensitive     CLINDAMYCIN <=0.25 SENSITIVE Sensitive     RIFAMPIN <=0.5 SENSITIVE Sensitive     Inducible Clindamycin NEGATIVE Sensitive     * ABUNDANT STAPHYLOCOCCUS AUREUS  Body fluid culture     Status: None   Collection Time: 09/09/17  3:00 PM  Result Value Ref Range Status   Specimen Description SYNOVIAL LEFT WRIST  Final   Special Requests NONE  Final   Gram Stain   Final    ABUNDANT WBC PRESENT, PREDOMINANTLY PMN MODERATE GRAM POSITIVE COCCI Performed at [facility], [place] [address], [place], [place] [address]    Culture FEW STAPHYLOCOCCUS AUREUS  Final   Report Status 09/12/2017 FINAL  Final   Organism ID, Bacteria STAPHYLOCOCCUS AUREUS  Final      Susceptibility   Staphylococcus aureus - MIC*    CIPROFLOXACIN <=0.5 SENSITIVE Sensitive      ERYTHROMYCIN <=0.25 SENSITIVE Sensitive     GENTAMICIN <=0.5 SENSITIVE Sensitive     OXACILLIN 0.5 SENSITIVE Sensitive     TETRACYCLINE <=1 SENSITIVE Sensitive     VANCOMYCIN <=0.5 SENSITIVE Sensitive     TRIMETH/SULFA <=10 SENSITIVE Sensitive     CLINDAMYCIN <=0.25 SENSITIVE Sensitive     RIFAMPIN <=0.5 SENSITIVE Sensitive     Inducible Clindamycin NEGATIVE Sensitive     * FEW STAPHYLOCOCCUS AUREUS  Culture, blood (Routine X 2) w Reflex to ID Panel     Status: Abnormal   Collection Time: 10/01/2017  9:25 AM  Result Value Ref Range Status   Specimen Description BLOOD LEFT FOREARM  Final   Special Requests   Final    BOTTLES DRAWN AEROBIC AND ANAEROBIC Blood Culture adequate volume   Culture  Setup Time   Final    GRAM POSITIVE COCCI AEROBIC BOTTLE ONLY CRITICAL RESULT CALLED TO, READ BACK BY AND VERIFIED WITH: A MEYER,PHARMD AT [place] 09/08/2017 BY L BENFIELD Performed at [facility], [place] [address] [place], [place] [address]    Culture STAPHYLOCOCCUS AUREUS (A)  Final   Report Status 10/03/2017 FINAL  Final   Organism ID, Bacteria STAPHYLOCOCCUS AUREUS  Final      Susceptibility   Staphylococcus aureus - MIC*    CIPROFLOXACIN <=0.5 SENSITIVE Sensitive     ERYTHROMYCIN <=0.25 SENSITIVE Sensitive     GENTAMICIN <=0.5 SENSITIVE Sensitive     OXACILLIN 0.5 SENSITIVE Sensitive     TETRACYCLINE <=1 SENSITIVE Sensitive     VANCOMYCIN 1 SENSITIVE Sensitive     TRIMETH/SULFA <=10 SENSITIVE Sensitive     CLINDAMYCIN <=0.25 SENSITIVE Sensitive     RIFAMPIN <=0.5 SENSITIVE Sensitive     Inducible Clindamycin NEGATIVE Sensitive     * STAPHYLOCOCCUS AUREUS  Culture, blood (single)     Status: None   Collection Time: 09/19/2017  9:32 AM  Result Value Ref Range Status   Specimen Description BLOOD RIGHT HAND  Final   Special Requests   Final    BOTTLES DRAWN AEROBIC ONLY Blood Culture adequate volume   Culture   Final    NO GROWTH 5 DAYS Performed at [facility], [place]  [address], [place], [place] [address]    Report Status 09/21/2017 FINAL  Final  Aerobic/Anaerobic Culture (surgical/deep wound)     Status: None   Collection Time: 09/12/17  1:53  PM  Result Value Ref Range Status   Specimen Description ABSCESS  Final   Special Requests Normal  Final   Gram Stain   Final    ABUNDANT WBC PRESENT, PREDOMINANTLY PMN ABUNDANT GRAM POSITIVE COCCI IN CLUSTERS    Culture   Final    FEW STAPHYLOCOCCUS AUREUS NO ANAEROBES ISOLATED Performed at [facility], [address], [place], [place] [address]    Report Status 09/17/2017 FINAL  Final   Organism ID, Bacteria STAPHYLOCOCCUS AUREUS  Final      Susceptibility   Staphylococcus aureus - MIC*    CIPROFLOXACIN <=0.5 SENSITIVE Sensitive     ERYTHROMYCIN <=0.25 SENSITIVE Sensitive     GENTAMICIN <=0.5 SENSITIVE Sensitive     OXACILLIN 0.5 SENSITIVE Sensitive     TETRACYCLINE <=1 SENSITIVE Sensitive     VANCOMYCIN <=0.5 SENSITIVE Sensitive     TRIMETH/SULFA <=10 SENSITIVE Sensitive     CLINDAMYCIN <=0.25 SENSITIVE Sensitive     RIFAMPIN <=0.5 SENSITIVE Sensitive     Inducible Clindamycin NEGATIVE Sensitive     * FEW STAPHYLOCOCCUS AUREUS  Blood culture (routine x 2)     Status: None (Preliminary result)   Collection Time: 09/19/2017  7:17 AM  Result Value Ref Range Status   Specimen Description BLOOD LEFT ANTECUBITAL  Final   Special Requests   Final    BOTTLES DRAWN AEROBIC AND ANAEROBIC Blood Culture adequate volume   Culture   Final    NO GROWTH 2 DAYS Performed at [facility], [place] [address] [place], [place] [address]    Report Status PENDING  Incomplete  Aerobic/Anaerobic Culture (surgical/deep wound)     Status: None (Preliminary result)   Collection Time: 09/22/2017  5:00 PM  Result Value Ref Range Status   Specimen Description WOUND  Final   Special Requests LEFT ELBOW PT ON DAPTOMYCIN  Final   Gram Stain   Final    RARE WBC PRESENT, PREDOMINANTLY PMN FEW GRAM POSITIVE COCCI    Culture    Final    MODERATE STAPHYLOCOCCUS AUREUS SUSCEPTIBILITIES TO FOLLOW Performed at [facility], [place] [address], [place], [place] [address]    Report Status PENDING  Incomplete  Surgical PCR screen     Status: Abnormal   Collection Time: 09/17/17  8:56 PM  Result Value Ref Range Status   MRSA, PCR NEGATIVE NEGATIVE Final   Staphylococcus aureus POSITIVE (A) NEGATIVE Final    Comment: (NOTE) The Xpert SA Assay (FDA approved for NASAL specimens in patients 11 years of age and older), is one component of a comprehensive surveillance program. It is not intended to diagnose infection nor to guide or monitor treatment. Performed at [facility], [place] [address], [place], [place] [address]   Body fluid culture     Status: None (Preliminary result)   Collection Time: 09/10/2017  9:47 AM  Result Value Ref Range Status   Specimen Description SYNOVIAL RIGHT ELBOW  Final   Special Requests PATIENT ON FOLLOWING DAPTOMYCIN SPEC A  Final   Gram Stain   Final    RARE WBC PRESENT, PREDOMINANTLY MONONUCLEAR NO ORGANISMS SEEN Performed at [facility], [place] [address], [place],  [address]    Culture PENDING  Incomplete   Report Status PENDING  Incomplete  Aerobic/Anaerobic Culture (surgical/deep wound)     Status: None (Preliminary result)   Collection Time: 09/30/2017 10:21 AM  Result Value Ref Range Status   Specimen Description ABSCESS RIGHT  ELBOW BURSA  Final   Special Requests SPEC B PATIENT ON FOLLOWING DAPTOMYCIN  Final   Gram Stain   Final    RARE WBC PRESENT, PREDOMINANTLY PMN FEW GRAM POSITIVE COCCI IN PAIRS IN CLUSTERS Performed at [facility], [place] [address], [place], [place] [address]    Culture PENDING  Incomplete   Report Status PENDING  Incomplete  Aerobic/Anaerobic Culture (surgical/deep wound)     Status: None (Preliminary result)   Collection Time: 09/10/2017 10:39 AM  Result Value Ref Range Status   Specimen Description ABSCESS LEFT HEEL  Final    Special Requests SPEC C PATIENT ON FOLLOWING DAPTOMYCIN  Final   Gram Stain   Final    RARE WBC PRESENT, PREDOMINANTLY PMN FEW GRAM POSITIVE COCCI IN PAIRS Performed at [facility], [place] [address] [place], [place] [address]    Culture PENDING  Incomplete   Report Status PENDING  Incomplete  Aerobic Culture (superficial specimen)     Status: None (Preliminary result)   Collection Time: 09/28/2017 10:43 AM  Result Value Ref Range Status   Specimen Description WOUND LEFT ANKLE PUS  Final   Special Requests SPEC D PATIENT ON FOLLOWING DAPTOMYCIN  Final   Gram Stain   Final    RARE WBC PRESENT, PREDOMINANTLY PMN FEW GRAM POSITIVE COCCI IN PAIRS Performed at [facility], [place] [address] [place], [place] [address]    Culture PENDING  Incomplete   Report Status PENDING  Incomplete  Aerobic Culture (superficial specimen)     Status: None (Preliminary result)   Collection Time: 09/21/2017 10:44 AM  Result Value Ref Range Status   Specimen Description WOUND LEFT ANKLE DEEP PUS  Final   Special Requests SPEC E PATIENT ON FOLLOWING DAPTOMYCIN  Final   Gram Stain   Final    RARE WBC PRESENT, PREDOMINANTLY PMN RARE GRAM POSITIVE COCCI IN PAIRS RARE GRAM NEGATIVE RODS Performed at [facility], [place] [address] [place], [place] [address]    Culture PENDING  Incomplete   Report Status PENDING  Incomplete  Aerobic/Anaerobic Culture (surgical/deep wound)     Status: None (Preliminary result)   Collection Time: 09/09/2017 10:53 AM  Result Value Ref Range Status   Specimen Description ABSCESS RIGHT ELBOW DEEP PUS  Final   Special Requests SPEC F PATIENT ON FOLLOWING DAPTOMYCIN  Final   Gram Stain   Final    RARE WBC PRESENT, PREDOMINANTLY PMN ABUNDANT GRAM POSITIVE COCCI Performed at [facility], [place] [address] [place], [place] [address]    Culture PENDING  Incomplete   Report Status PENDING  Incomplete    Studies/Results: Dg Chest Port 1 View  Result Date:  09/17/2017 CLINICAL DATA:  Shortness of Breath EXAM: PORTABLE CHEST 1 VIEW COMPARISON:  10/02/2017 FINDINGS: The cardiac shadow is mildly enlarged. Endotracheal tube and nasogastric catheter is well as a right jugular central line again seen and stable. No pneumothorax is noted. Significant patient rotation is noted accentuating the mediastinal markings. Central vascular congestion is again seen and stable. Small right-sided pleural effusion is noted. The left-sided pleural effusion is not well appreciated on this exam. IMPRESSION: Stable vascular congestion. Tubes and lines as described. Small right pleural effusion. Electronically Signed   By: [place] Catalina M.D.   On: 09/17/2017 07:04      Assessment/Plan:  INTERVAL HISTORY:   That is post extensive surgery by Dr. Marcelino Scot today  She is back on pressors  Principal Problem:  MSSA bacteremia Active Problems:   Thalassemia   Rheumatoid arthritis ([place])   Morbid obesity ([place])   Acute back pain   Essential hypertension   Diabetes ([place])   Dyslipidemia   Severe sepsis with septic shock ([place])   Acute kidney injury ([place])   Immunosuppression due to drug therapy   Rheumatoid arthritis(714.0)   Septic joint of left wrist ([place])   Osteomyelitis of great toe of right foot ([place])   Hypertension   Hyperlipidemia   Acute hearing loss of left ear   Diabetes mellitus, insulin dependent (IDDM), controlled (HCC)   Beta thalassemia (HCC)   Acute anemia   Elevated liver enzymes   Hyperbilirubinemia   Altered mental status, unspecified   Atrial flutter (HCC)   Infection of right hand   Abscess   Acute renal failure (HCC)   Acute respiratory failure (HCC)   Encephalopathy   Endotracheally intubated   Staphylococcal arthritis of right wrist (HCC)   Facet joint disease   Pleural effusion   Left wrist pain   Goals of care, counseling/discussion    Jessica Howard is a 57 y.o. female with  RA  Admitted with MSSA B with septic shock, ARF, shock  liver with evidence of endocarditis and "metastatic infection:"With severe infection of her right ankle and toe that will require a below the knee amputation extensive infection in bilateral wrists left worse than right status post surgery by Dr. Amedeo Plenty, also with L2-L3 septic facet joint with pus collection that has been drained.  Today she went to the operating room and had an extensive surgery performed by Dr. Marcelino Scot including:  1. AMPUTATION HAND (Left) THROUGH THE FOREARM 2. IRRIGATION AND DEBRIDEMENT OF RIGHT ELBOW SEPTIC BURSITIS 3. REPEAT IRRIGATION AND DEBRIDEMENT OF LEFT ELBOW SEPTIC BURSITIS 4. IRRIGATION AND DEBRIDEMENT LEFT ANKLE SOFT TISSUE ABSCESS 5. IRRIGATION AND DEBRIDEMENT right hand and thumb (Right) 6. ASPIRATION RIGHT ELBOW JOINT 7. ASPIRATION OF LEFT ANKLE JOINT 8. ASPIRATION OF RIGHT ELBOW OLECRANON BURSA 9. DRESSING CHANGE UNDER ANESTHESIA, right lower residual limb (Right)     #1 MSSAB with shock metastatic disease:       [facility] Staphylococcus aureus bacteremia   Staphylococcus aureus bacteremia (SAB) is associated with a high rate of complications and mortality.  Specific aspects of clinical management are critical to optimizing the outcome of patients with SAB.  Therefore, the [facility] Antimicrobial Management Team [facility]) has initiated an intervention aimed at improving the management of SAB at [facility].  To do so, Infectious Diseases physicians are providing an evidence-based consult for the management of all patients with SAB.     Yes No Comments  Perform follow-up blood cultures (even if the patient is afebrile) to ensure clearance of bacteremia _0  _1  10/06/2017 ARE growing 1/2 sites unfortunately, repeat cultures taken on 9th not growing  CENTRAL LINE REMOVAL blood cultures done to prove clearance with lines gone not sure that is going to something that can be done  Remove vascular catheter and obtain follow-up blood  cultures after the removal of the catheter _2  _3  HD line holiday when possible but I do not see her surviving to go through this  Perform echocardiography to evaluate for endocarditis (transthoracic ECHO is 40-50% sensitive, TEE is > 90% sensitive) _4  _5  Please keep in mind, that neither test can definitively EXCLUDE endocarditis, and that should clinical suspicion remain high for endocarditis the patient should then still be treated with an "endocarditis" duration of therapy = 6  weeks  Thickened leaflets on the mitral valve but without clear-cut vegetation  Consult electrophysiologist to evaluate implanted cardiac device (pacemaker, ICD) _0  _1    Ensure source control _2  _3  Have all abscesses been drained effectively? Have deep seeded infections (septic joints or osteomyelitis) had appropriate surgical debridement?    She has had extensive surgeries.  Today she did have aspiration of the right and left elbow joints and right synovial fluid has [place] which is concerning for septic elbow joint itself. This would technically need I and D but son has told me that today was her last surgery.  Investigate for "metastatic" sites of infection _4  _5  Does the patient have ANY symptom or physical exam finding that would suggest a deeper infection (back or neck pain that may be suggestive of vertebral osteomyelitis or epidural abscess, muscle pain that could be a symptom of pyomyositis)?  Keep in mind that for deep seeded infections MRI imaging with contrast is preferred rather than other often insensitive tests such as plain x-rays, especially early in a patient's presentation.  She has had aggressive surgery by Dr. Amedeo Plenty and Mardelle Matte and Dr. Marcelino Scot     Change antibiotic therapy to daptomycin _6  _7  Beta-lactam antibiotics are preferred for MSSA due to higher cure rates.   If on Vancomycin, goal trough should be 15 - 20 mcg/mL  Estimated duration of IV antibiotic therapy:  8 weeks _8  _9  Consult  case management for probably prolonged outpatient IV antibiotic therapy     #3 Left foot infection: Status post transtibial amputation #4 gangrenous infection of the left wrist status post amputations of fingers and now status post mutation through the forearm. This is due to MSSA that is EVERYWHERE #5 left septic bursitis status post I&D due to MSSA #6 right hand and thumb infection status post I&D ""  #7   Right septic bursitis status post I&D ""  #8 Concern for right septic elbow given GPC on GStain: Is she going to have I and D of this elbow? Son says this is her last surgery  #9 Left ankle infection sp I and D and GS shwing rare gram-positive cocci in pairs and some rare gram-negative rods: I will add an antibiotic to cover gram-negative organisms (CIPRO)  given this 1 rare gram-negative rod that is seen though I do not think this is the problem the problem is this patient has severe methicillin sensitive staph aureus destructive pathology.   #10  LBP: facet jiont infection as post IR guided drainage came back for discussion of goals of care and prognosis with Dr. Tarri Abernethy and patients family including her son, sister and mother.  Goals of care: Unfortunately I do not think that Mrs. Monceaux is going to survive this hospitalization.  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  From talking with the son it appears that her diagnosis was missed on several trips to the ER it was only when she presented with shock that we found out that she had severe staphylococcal infection.  Her son asked whether patient was on the right antibiotics.  She certainly is on the right antibiotic in terms of covering for the methicillin sensitive Staphylococcus aureus in the context of her cephalosporin and  penicillin allergies and in the context of her severe renal failure.  Daptomycin is the only other drug  we really have to go to and this can scenario.  Subsequent to that conversation I did find now a culture with Gram stain showing a few rare gram-negative rods from her left ankle so I will add an antibiotic to cover this consistent with her allergies.  I had contemplated adding clindamycin.  And I have discussed this with Heide Guile from [place]. This  Patients presentation is not a necrotizing fascitis from GAS where clindamycin is helpful with toxin inhibition nor is it a for coccal toxic shock syndrome either.  Rather I think this is just a severe "slow burning" but very destructive MSSA that has evaded diagnosis in context of her RA, obesity, diagnosis having been missed until she was in septic shock and not able to be imaged as aggressively. Sometimes patients can be on the correct antibiotics, and have the correct surgeries and the correct care but still ultimately die.  Son continues to tell me that his mother wants to fight and that she expressed this to him very much and she still is wanting to fight.  I did tell him though that she would not be able to know what is going on right now and that he would need to be able to make decisions for her doing what he knows now in terms of her current state.  We can continue with the very aggressive care that she is getting.  I greatly appreciate the excellent care of the critical care team cardiology nephrology and multidisciplinary orthopedic surgery including doctors Hilton Sinclair and now [place]  I spent greater than 35  minutes with the patient including greater than 50% of time in face to face counsel of the patient's son with regards to her severe metastatic staphylococcal aureus bacteremia with multiorgan failure and multiple sites of devastating infection and in coordination of her care.      LOS: 13 days   Alcide Evener 09/15/2017, 4:03 PM

## 2017-09-18 NOTE — Anesthesia Postprocedure Evaluation (Signed)
Anesthesia Post Note  Patient: Jessica Howard  Procedure(s) Performed: RIGHT AMPUTATION BELOW KNEE (Right Leg Lower) INCISION AND DRAINAGE OF RIGHT THUMB AND LEFT WRIST (Bilateral Hand)     Patient location during evaluation: SICU Anesthesia Type: General Level of consciousness: sedated Pain management: pain level controlled Vital Signs Assessment: post-procedure vital signs reviewed and stable Respiratory status: patient remains intubated per anesthesia plan Cardiovascular status: stable Postop Assessment: no apparent nausea or vomiting Anesthetic complications: no    Last Vitals:  Vitals:   09/19/2017 0400 10/05/2017 0500  BP: (!) 114/48 (!) 120/54  Pulse: 78 78  Resp: 18 (!) 9  Temp: 37.5 C   SpO2: 100% 99%    Last Pain:  Vitals:   10/06/2017 0400  TempSrc: Oral  PainSc:                  Pankaj Haack COKER

## 2017-09-18 NOTE — Anesthesia Preprocedure Evaluation (Addendum)
Anesthesia Evaluation  Patient identified by MRN, date of birth, ID band Patient awake  General Assessment Comment:Multi organ system failures noted  Reviewed: Unable to perform ROS - Chart review only  Airway Mallampati: Intubated   Neck ROM: Limited   Comment: intubated Dental no notable dental hx.    Pulmonary asthma , sleep apnea , former smoker,  ards   + rhonchi  + decreased breath sounds      Cardiovascular hypertension, +CHF   Rhythm:Regular Rate:Tachycardia     Neuro/Psych    GI/Hepatic GERD  ,  Endo/Other  diabetes  Renal/GU Renal disease     Musculoskeletal  (+) Arthritis ,   Abdominal (+) + obese,   Peds  Hematology  (+) anemia ,   Anesthesia Other Findings   Reproductive/Obstetrics                            Anesthesia Physical Anesthesia Plan  ASA: IV  Anesthesia Plan: General   Post-op Pain Management:    Induction: Intravenous  PONV Risk Score and Plan: 3 and Treatment may vary due to age or medical condition  Airway Management Planned: Oral ETT  Additional Equipment:   Intra-op Plan:   Post-operative Plan: Post-operative intubation/ventilation  Informed Consent:   Plan Discussed with:   Anesthesia Plan Comments:         Anesthesia Quick Evaluation

## 2017-09-18 NOTE — Transfer of Care (Signed)
Immediate Anesthesia Transfer of Care Note  Patient: Jessica Howard  Procedure(s) Performed: IRRIGATION AND DEBRIDEMENT OF ELBOW AND WRIST (Left Arm Lower) IRRIGATION AND DEBRIDEMENT ankle (Left Leg Lower) IRRIGATION AND DEBRIDEMENT right elbow (Right Arm Lower) IRRIGATION AND DEBRIDEMENT right hand and thumb (Right Arm Lower) AMPUTATION HAND (Left Arm Lower) DRESSING CHANGE UNDER ANESTHESIA, right lower residual limb (Right Leg Lower)  Patient Location: ICU  Anesthesia Type:General  Level of Consciousness: Patient remains intubated per anesthesia plan  Airway & Oxygen Therapy: Patient remains intubated per anesthesia plan  Post-op Assessment: Report given to RN and Post -op Vital signs reviewed and stable  Post vital signs: Reviewed and stable  Last Vitals:  Vitals Value Taken Time  BP 99/50 09/24/2017 12:30 PM  Temp    Pulse    Resp 18 10/02/2017 12:32 PM  SpO2    Vitals shown include unvalidated device data.  Last Pain:  Vitals:   09/09/2017 0807  TempSrc: Oral  PainSc:       Patients Stated Pain Goal: 0 (70/92/95 7473)  Complications: No apparent anesthesia complications

## 2017-09-18 NOTE — Brief Op Note (Signed)
09/08/2017  12:11 PM  PATIENT:  Jessica Howard  57 y.o. female  PRE-OPERATIVE DIAGNOSIS:  1. GANGRENOUS INFECTION LEFT WRIST, ULNA, CARPUS, WITH NECROTIC SOFT TISSUE ENVELOPE AND DESICCATED TENDONS 2. RIGHT ELBOW SEPTIC BURSITIS 3. LEFT ANKLE SOFT TISSUE ABSCESS 4. OPEN RIGHT THUMB INFECTION 5. OPEN LEFT OLECRANON BURSITIS INFECTION 6. RIGHT BELOW KNEE AMPUTATION  POST-OPERATIVE DIAGNOSIS: 1. GANGRENOUS INFECTION LEFT WRIST, ULNA, CARPUS, WITH NECROTIC SOFT TISSUE ENVELOPE AND DESICCATED TENDONS 2. RIGHT ELBOW SEPTIC BURSITIS 3. LEFT ANKLE SOFT TISSUE ABSCESS 4. OPEN RIGHT THUMB INFECTION 5. OPEN LEFT OLECRANON BURSITIS INFECTION 6. RIGHT BELOW KNEE AMPUTATION  PROCEDURE:  Procedure(s): 1. AMPUTATION HAND (Left) THROUGH THE FOREARM 2. IRRIGATION AND DEBRIDEMENT OF RIGHT ELBOW SEPTIC BURSITIS 3. REPEAT IRRIGATION AND DEBRIDEMENT OF LEFT ELBOW SEPTIC BURSITIS 4. IRRIGATION AND DEBRIDEMENT LEFT ANKLE SOFT TISSUE ABSCESS 5. IRRIGATION AND DEBRIDEMENT right hand and thumb (Right) 6. ASPIRATION RIGHT ELBOW JOINT 7. ASPIRATION OF LEFT ANKLE JOINT 8. ASPIRATION OF RIGHT ELBOW OLECRANON BURSA 9. DRESSING CHANGE UNDER ANESTHESIA, right lower residual limb (Right)  SURGEON:  Surgeon(s) and Role:    Altamese [place], MD - Primary  PHYSICIAN ASSISTANT: Ainsley Spinner, PA-C  ANESTHESIA:   general  EBL:  100 mL   BLOOD ADMINISTERED:none  DRAINS: none   LOCAL MEDICATIONS USED:  NONE  SPECIMEN:  Source of Specimen:  multiple: 1. right elbow joint, 2. right olecranon bursa aspiration, 3. right olec bursa material x 2, 4. left ankle abscess, 5. left hand (gross specimen)  DISPOSITION OF SPECIMEN:  micro and path  COUNTS:  YES  TOURNIQUET:  * No tourniquets in log *  DICTATION: .Other Dictation: Dictation Number Partially dictated. (Phone died after 20+ min)  PLAN OF CARE: Admit to inpatient   PATIENT DISPOSITION:  ICU hemodynamically stable   Delay start of Pharmacological  VTE agent (>24hrs) due to surgical blood loss or risk of bleeding: no

## 2017-09-18 NOTE — Progress Notes (Signed)
Patient ID: Jessica Howard, female   DOB: [birth year], 57 y.o.   MRN: [id number] [facility] Progress Note   Assessment/ Plan:    1. Acute kidney injury: Anuric and without any evidence of renal recovery, status post hemodialysis yesterday for clearance and without any acute indications for dialysis today.  Unfortunately, worsening overall prognosis noted at this point.  We will continue daily monitoring and determination of needs. 2. MSSA septicemia with diffuseseeding of joints/spine: On Daptomycinper ID. S/P right BKA for osteomyelitis/joint+ fascial space infection and multifocal irrigation/debridement and exploratory fasciotomy.  Appreciate the aggressive efforts by orthopedic surgery/hand surgery in her management and plans noted for additional surgery today with efforts at irrigation/debridement and assessment of limb (LUE) viability. 3. Atrial fibrillation currently sinus rhythm and rate controlled on amiodarone s/p DCCV. 4. Abnormal LFT's-likely shock liver associated with bacteremia/sepsis.  Hypoalbuminemia likely from synthetic defect versus acute phase reactant. 5. Anemia of acute illness- follow and transfuse prn 6. Vascular access- RIJ catheter placed 09/07/17. 7. Rheumatoid arthritis  8. Thalassemia  Subjective:   Febrile overnight with intermittently low blood pressures   Objective:   BP (!) 114/54   Pulse 73   Temp 99.5 F (37.5 C) (Oral)   Resp 18   Ht 5' 2"  (1.575 m)   Wt 128.6 kg (283 lb 8.2 oz)   LMP 12/27/2013 Comment: irreggular  SpO2 100%   BMI 51.85 kg/m   Intake/Output Summary (Last 24 hours) at 10/04/2017 0744 Last data filed at 10/05/2017 0600 Gross per 24 hour  Intake 1969.25 ml  Output 2070 ml  Net -100.75 ml   Weight change: -4.2 kg (-9 lb 4.2 oz)  Physical Exam: Gen: Intubated, awake CVS: Pulse regular rhythm, normal rate, S1 and S2 normal Resp: Mechanical breath sounds bilaterally, no distinct rales or rhonchi Abd: Soft,  obese, nontender Ext: Left arm with dressing/wound VAC (propped up on cushion), right arm in dressing, s/p R BKA  Imaging: Dg Chest Port 1 View  Result Date: 09/17/2017 CLINICAL DATA:  Shortness of Breath EXAM: PORTABLE CHEST 1 VIEW COMPARISON:  09/22/2017 FINDINGS: The cardiac shadow is mildly enlarged. Endotracheal tube and nasogastric catheter is well as a right jugular central line again seen and stable. No pneumothorax is noted. Significant patient rotation is noted accentuating the mediastinal markings. Central vascular congestion is again seen and stable. Small right-sided pleural effusion is noted. The left-sided pleural effusion is not well appreciated on this exam. IMPRESSION: Stable vascular congestion. Tubes and lines as described. Small right pleural effusion. Electronically Signed   By: Inez Catalina M.D.   On: 09/17/2017 07:04    Labs: BMET Recent Labs  Lab 09/13/17 0438 09/13/17 1633 09/25/2017 0313 09/15/17 0408 09/15/17 1452 09/26/2017 0500 09/21/2017 1731 09/17/17 0540 10/04/2017 0515  NA 135 134* 133* 132* 132* 131* 137 133* 132*  K 3.6 3.9 4.1 4.5 4.7 5.7* 4.7 5.1 3.7  CL 100* 100* 99* 100* 99* 101  --  99* 97*  CO2 22 23 23  18* 21* 19*  --  23 26  GLUCOSE 142* 157* 130* 117* 108* 112* 89 118* 112*  BUN 31* 23* 18 29* 32* 42*  --  36* 28*  CREATININE 2.38* 1.89* 1.65* 2.74* 3.14* 4.06*  --  3.13* 2.51*  CALCIUM 8.0* 7.9* 7.8* 7.5* 7.7* 7.5*  --  7.5* 7.4*  PHOS 3.4 3.3 3.4  3.4 3.7  --  7.0*  --  6.6* 4.0   CBC Recent Labs  Lab 09/15/17 0408 09/29/2017  0335 10/04/2017 1731 09/17/17 0540 09/28/2017 0515  WBC 16.2* 15.4*  --  14.2* 16.7*  HGB 7.0* 7.9* 7.5* 8.8* 7.5*  HCT 20.7* 25.5* 22.0* 27.3* 23.1*  MCV 72.1* 75.9*  --  79.8 81.9  PLT 153 178  --  160 213    Medications:    . amiodarone  200 mg Oral BID  . chlorhexidine gluconate (MEDLINE KIT)  15 mL Mouth Rinse BID  . Chlorhexidine Gluconate Cloth  6 each Topical Daily  . docusate  100 mg Per Tube BID  .  feeding supplement (PRO-STAT SUGAR FREE 64)  60 mL Per Tube TID  . feeding supplement (VITAL HIGH PROTEIN)  1,000 mL Per Tube Q24H  . insulin aspart  0-15 Units Subcutaneous Q4H  . lactulose  10 g Oral Daily  . mouth rinse  15 mL Mouth Rinse 10 times per day  . mupirocin ointment  1 application Nasal BID  . pantoprazole (PROTONIX) IV  40 mg Intravenous Q24H  . sennosides  5 mL Per Tube Daily  . sodium chloride flush  3 mL Intravenous Q12H   Elmarie Shiley, MD 09/17/2017, 7:44 AM

## 2017-09-18 NOTE — Progress Notes (Signed)
Orthopedic Tech Progress Note Patient Details:  Jessica Howard [birth year] [id number]  Ortho Devices Ortho Device/Splint Location: Prafo boot       [place] Pink 10/04/2017, 10:22 AM

## 2017-09-18 NOTE — Anesthesia Procedure Notes (Signed)
Date/Time: 09/15/2017 7:59 AM Performed by: Eligha Bridegroom, CRNA Pre-anesthesia Checklist: Patient identified, Emergency Drugs available, Suction available, Patient being monitored and Timeout performed Patient Re-evaluated:Patient Re-evaluated prior to induction Oxygen Delivery Method: Circle system utilized Induction Type: Inhalational induction Placement Confirmation: positive ETCO2 and breath sounds checked- equal and bilateral

## 2017-09-18 NOTE — Progress Notes (Signed)
I have agreed to perform surgical debridement for Dr. Mardelle Matte today given the patient's need, scheduling constraints, and my availability.  I discussed with the patient's son, Amesha Bailey, the risks and benefits of surgery, including the possibility of persistent infection, death, nerve injury, vessel injury, wound breakdown, DVT/ PE, loss of motion, and need for further surgery among others.  He acknowledged these risks and wished to proceed. He also stressed his wish that the debridement be as thorough as possible to decrease risk of continued infection.  Altamese [place], MD [facility] [phone] (p)

## 2017-09-18 NOTE — Progress Notes (Signed)
ANTICOAGULATION CONSULT NOTE - Follow Up Consult  Pharmacy Consult for heparin Indication: atrial fibrillation  Allergies  Allergen Reactions  . Bactrim [Sulfamethoxazole-Trimethoprim] Hives  . Cefuroxime Axetil Other (See Comments)    Blistering wounds per family  . Cephalosporins Other (See Comments)    Blistering wounds as an adult per family  . Lisinopril Cough  . Penicillins Hives  . Iohexol Itching and Rash     Code: RASH, Desc: HAD ITCHING AND A RASH ABOUT ONE HOUR AFTER RETURNING HOME FROM THE CT, Onset Date: [id number]   . Sulfa Antibiotics Hives    Patient Measurements: Height: 5\' 2"  (157.5 cm) Weight: 283 lb 8.2 oz (128.6 kg) IBW/kg (Calculated) : 50.1 Heparin Dosing Weight: 81 kg  Vital Signs: Temp: 98.6 F (37 C) (04/11 1253) Temp Source: Oral (04/11 1253) BP: 120/54 (04/11 1430) Pulse Rate: 70 (04/11 1430)  Labs: Recent Labs    09/17/2017 0335 09/26/2017 0500 09/28/2017 1731 09/17/17 0540 09/17/17 2056 09/08/2017 0515  HGB 7.9*  --  7.5* 8.8*  --  7.5*  HCT 25.5*  --  22.0* 27.3*  --  23.1*  PLT 178  --   --  160  --  213  HEPARINUNFRC <0.10*  --   --   --  0.50 0.23*  CREATININE  --  4.06*  --  3.13*  --  2.51*  CKTOTAL  --   --   --  439*  --   --     Estimated Creatinine Clearance: 32.6 mL/min (A) (by C-G formula based on SCr of 2.51 mg/dL (H)).   Assessment: 77 yoF with disseminated MSSA and rapid AFib now s/p DCCV on 4/4. Pt has been on heparin intermittently in between holding for surgeries.   The patient is s/p I&D this AM and Heparin was turned off this AM prior to the OR. Post-op, the plan is to resume Heparin at 1830 this evening.   Goal of Therapy:  Heparin level 0.3-0.7 units/ml Monitor platelets by anticoagulation protocol: Yes   Plan:  - Resume Heparin at 2050 units/hr (20.5 ml/hr) starting at 1830 today - Will continue to monitor for any signs/symptoms of bleeding and will follow up with heparin level in 8 hours after restart  Thank  you for allowing pharmacy to be a part of this patient's care.  Alycia Rossetti, PharmD, BCPS Clinical Pharmacist Pager: [phone] Clinical phone for 10/05/2017 from 7a-3:30p: [phone] If after 3:30p, please call main pharmacy at: [phone] 09/09/2017 2:59 PM

## 2017-09-19 ENCOUNTER — Inpatient Hospital Stay (HOSPITAL_COMMUNITY): Payer: Medicare Other

## 2017-09-19 ENCOUNTER — Encounter (HOSPITAL_COMMUNITY): Payer: Self-pay | Admitting: Orthopedic Surgery

## 2017-09-19 DIAGNOSIS — M71021 Abscess of bursa, right elbow: Secondary | ICD-10-CM

## 2017-09-19 DIAGNOSIS — D561 Beta thalassemia: Secondary | ICD-10-CM

## 2017-09-19 DIAGNOSIS — M71122 Other infective bursitis, left elbow: Secondary | ICD-10-CM

## 2017-09-19 DIAGNOSIS — Z7189 Other specified counseling: Secondary | ICD-10-CM

## 2017-09-19 DIAGNOSIS — Z89511 Acquired absence of right leg below knee: Secondary | ICD-10-CM

## 2017-09-19 DIAGNOSIS — Z515 Encounter for palliative care: Secondary | ICD-10-CM

## 2017-09-19 DIAGNOSIS — M71121 Other infective bursitis, right elbow: Secondary | ICD-10-CM

## 2017-09-19 LAB — HEPARIN LEVEL (UNFRACTIONATED)
Heparin Unfractionated: 0.16 IU/mL — ABNORMAL LOW (ref 0.30–0.70)
Heparin Unfractionated: <0.1 IU/mL — ABNORMAL LOW (ref 0.30–0.70)

## 2017-09-19 LAB — COMPREHENSIVE METABOLIC PANEL
ALBUMIN: 1.2 g/dL — AB (ref 3.5–5.0)
ALT: 26 U/L (ref 14–54)
AST: 88 U/L — AB (ref 15–41)
Alkaline Phosphatase: 416 U/L — ABNORMAL HIGH (ref 38–126)
Anion gap: 10 (ref 5–15)
BUN: 50 mg/dL — AB (ref 6–20)
CHLORIDE: 96 mmol/L — AB (ref 101–111)
CO2: 24 mmol/L (ref 22–32)
CREATININE: 3.43 mg/dL — AB (ref 0.44–1.00)
Calcium: 7.5 mg/dL — ABNORMAL LOW (ref 8.9–10.3)
GFR calc Af Amer: 16 mL/min — ABNORMAL LOW (ref >60)
GFR calc non Af Amer: 14 mL/min — ABNORMAL LOW (ref >60)
Glucose, Bld: 123 mg/dL — ABNORMAL HIGH (ref 65–99)
Potassium: 4.2 mmol/L (ref 3.5–5.1)
SODIUM: 130 mmol/L — AB (ref 135–145)
Total Bilirubin: 5 mg/dL — ABNORMAL HIGH (ref 0.3–1.2)
Total Protein: 5.4 g/dL — ABNORMAL LOW (ref 6.5–8.1)

## 2017-09-19 LAB — GLUCOSE, CAPILLARY
GLUCOSE-CAPILLARY: 120 mg/dL — AB (ref 65–99)
Glucose-Capillary: 138 mg/dL — ABNORMAL HIGH (ref 65–99)
Glucose-Capillary: 119 mg/dL — ABNORMAL HIGH (ref 65–99)
Glucose-Capillary: 112 mg/dL — ABNORMAL HIGH (ref 65–99)
Glucose-Capillary: 123 mg/dL — ABNORMAL HIGH (ref 65–99)
Glucose-Capillary: 114 mg/dL — ABNORMAL HIGH (ref 65–99)

## 2017-09-19 LAB — TYPE AND SCREEN
ABO/RH(D): A POS
Antibody Screen: POSITIVE
DONOR AG TYPE: NEGATIVE
Donor AG Type: NEGATIVE
UNIT DIVISION: 0
Unit division: 0
Unit division: 0
Unit division: 0

## 2017-09-19 LAB — BPAM RBC
BLOOD PRODUCT EXPIRATION DATE: 201905012359
Blood Product Expiration Date: 201904232359
Blood Product Expiration Date: 201905012359
Blood Product Expiration Date: 201905022359
ISSUE DATE / TIME: 201904111038
UNIT TYPE AND RH: 6200
Unit Type and Rh: 6200
Unit Type and Rh: 6200
Unit Type and Rh: 6200

## 2017-09-19 LAB — POCT I-STAT 3, VENOUS BLOOD GAS (G3P V)
Acid-Base Excess: 1 mmol/L (ref 0.0–2.0)
Bicarbonate: 28.1 mmol/L — ABNORMAL HIGH (ref 20.0–28.0)
O2 SAT: 50 %
PCO2 VEN: 62.1 mmHg — AB (ref 44.0–60.0)
PH VEN: 7.276 (ref 7.250–7.430)
Patient temperature: 103
TCO2: 30 mmol/L (ref 22–32)
pO2, Ven: 36 mmHg (ref 32.0–45.0)

## 2017-09-19 LAB — CBC
HCT: 24.5 % — ABNORMAL LOW (ref 36.0–46.0)
HEMOGLOBIN: 7.9 g/dL — AB (ref 12.0–15.0)
MCH: 27.1 pg (ref 26.0–34.0)
MCHC: 32.2 g/dL (ref 30.0–36.0)
MCV: 83.9 fL (ref 78.0–100.0)
Platelets: 197 10*3/uL (ref 150–400)
RBC: 2.92 MIL/uL — AB (ref 3.87–5.11)
RDW: 19.5 % — ABNORMAL HIGH (ref 11.5–15.5)
WBC: 14 10*3/uL — ABNORMAL HIGH (ref 4.0–10.5)

## 2017-09-19 LAB — PHOSPHORUS: PHOSPHORUS: 4.4 mg/dL (ref 2.5–4.6)

## 2017-09-19 LAB — MAGNESIUM: MAGNESIUM: 1.9 mg/dL (ref 1.7–2.4)

## 2017-09-19 LAB — CK: Total CK: 1911 U/L — ABNORMAL HIGH (ref 38–234)

## 2017-09-19 MED ORDER — LINEZOLID 600 MG/300ML IV SOLN
600.0000 mg | Freq: Two times a day (BID) | INTRAVENOUS | Status: DC
  Administered 2017-09-20 – 2017-09-22 (×5): 600 mg via INTRAVENOUS
  Filled 2017-09-19 (×6): qty 300

## 2017-09-19 MED ORDER — AMIODARONE HCL 200 MG PO TABS
200.0000 mg | ORAL_TABLET | Freq: Every day | ORAL | Status: DC
  Administered 2017-09-20 – 2017-09-22 (×3): 200 mg via ORAL
  Filled 2017-09-19 (×3): qty 1

## 2017-09-19 MED ORDER — VITAL HIGH PROTEIN PO LIQD
1000.0000 mL | ORAL | Status: DC
  Administered 2017-09-19 – 2017-09-22 (×4): 1000 mL

## 2017-09-19 MED ORDER — HEPARIN BOLUS VIA INFUSION
2000.0000 [IU] | Freq: Once | INTRAVENOUS | Status: AC
  Administered 2017-09-19: 2000 [IU] via INTRAVENOUS
  Filled 2017-09-19: qty 2000

## 2017-09-19 NOTE — Progress Notes (Signed)
Subjective:  Intubated, sedated  Antibiotics:  Anti-infectives (From admission, onward)   Start     Dose/Rate Route Frequency Ordered Stop   09/27/2017 2000  linezolid (ZYVOX) IVPB 600 mg     600 mg 300 mL/hr over 60 Minutes Intravenous Every 12 hours 09/19/17 0829     09/19/2017 2100  DAPTOmycin (CUBICIN) 1,000 mg in sodium chloride 0.9 % IVPB  Status:  Discontinued     1,000 mg 240 mL/hr over 30 Minutes Intravenous Every 48 hours 09/10/2017 2033 09/22/2017 2047   09/10/2017 1800  ciprofloxacin (CIPRO) IVPB 400 mg     400 mg 200 mL/hr over 60 Minutes Intravenous Every 24 hours 09/13/2017 1632     09/08/2017 2047  DAPTOmycin (CUBICIN) 1,000 mg in sodium chloride 0.9 % IVPB  Status:  Discontinued     1,000 mg 240 mL/hr over 30 Minutes Intravenous Every 48 hours 09/19/2017 2047 09/19/17 0829   10/05/2017 1800  DAPTOmycin (CUBICIN) 1,000 mg in sodium chloride 0.9 % IVPB  Status:  Discontinued     1,000 mg 240 mL/hr over 30 Minutes Intravenous Every 48 hours 10/04/2017 1103 09/30/2017 2033   09/08/17 1600  DAPTOmycin (CUBICIN) 1,000 mg in sodium chloride 0.9 % IVPB  Status:  Discontinued     1,000 mg 240 mL/hr over 30 Minutes Intravenous Every 48 hours 09/07/17 1019 09/13/2017 1103   09/07/17 1000  vancomycin (VANCOCIN) 1,500 mg in sodium chloride 0.9 % 500 mL IVPB  Status:  Discontinued     1,500 mg 250 mL/hr over 120 Minutes Intravenous Every 48 hours 08/31/2017 0906 09/06/17 0935   09/06/17 1600  DAPTOmycin (CUBICIN) 984 mg in sodium chloride 0.9 % IVPB  Status:  Discontinued     8 mg/kg  123 kg 239.4 mL/hr over 30 Minutes Intravenous Every 48 hours 09/06/17 1436 09/07/17 1019   09/06/17 1000  aztreonam (AZACTAM) 0.5 g in dextrose 5 % 50 mL IVPB  Status:  Discontinued     0.5 g 100 mL/hr over 30 Minutes Intravenous Every 8 hours 09/06/17 0943 09/06/17 1223   08/23/2017 2000  metroNIDAZOLE (FLAGYL) IVPB 500 mg  Status:  Discontinued     500 mg 100 mL/hr over 60 Minutes Intravenous Every 8 hours  08/12/2017 1343 09/06/17 0028   08/25/2017 0900  aztreonam (AZACTAM) 2 g in sodium chloride 0.9 % 100 mL IVPB  Status:  Discontinued     2 g 200 mL/hr over 30 Minutes Intravenous Every 8 hours 09/01/2017 0853 09/06/17 0028   08/18/2017 0900  metroNIDAZOLE (FLAGYL) IVPB 500 mg     500 mg 100 mL/hr over 60 Minutes Intravenous  Once 09/04/2017 0853 09/03/2017 1035   08/10/2017 0830  piperacillin-tazobactam (ZOSYN) IVPB 3.375 g  Status:  Discontinued     3.375 g 100 mL/hr over 30 Minutes Intravenous  Once 09/02/2017 0816 09/04/2017 0836   08/13/2017 0830  vancomycin (VANCOCIN) IVPB 1000 mg/200 mL premix  Status:  Discontinued     1,000 mg 200 mL/hr over 60 Minutes Intravenous  Once 09/01/2017 0816 08/24/2017 0822   08/09/2017 0830  vancomycin (VANCOCIN) 2,000 mg in sodium chloride 0.9 % 500 mL IVPB     2,000 mg 250 mL/hr over 120 Minutes Intravenous  Once 08/26/2017 0822 09/03/2017 1342      Medications: Scheduled Meds: . [START ON 09/29/2017] amiodarone  200 mg Oral Daily  . chlorhexidine gluconate (MEDLINE KIT)  15 mL Mouth Rinse BID  . Chlorhexidine Gluconate Cloth  6 each  Topical Daily  . docusate  100 mg Per Tube BID  . feeding supplement (PRO-STAT SUGAR FREE 64)  60 mL Per Tube TID  . feeding supplement (VITAL HIGH PROTEIN)  1,000 mL Per Tube Q24H  . insulin aspart  0-15 Units Subcutaneous Q4H  . lactulose  10 g Oral Daily  . mouth rinse  15 mL Mouth Rinse 10 times per day  . mupirocin ointment  1 application Nasal BID  . pantoprazole (PROTONIX) IV  40 mg Intravenous Q24H  . sennosides  5 mL Per Tube Daily  . sodium chloride flush  3 mL Intravenous Q12H   Continuous Infusions: . sodium chloride 10 mL/hr at 09/19/17 1000  . ciprofloxacin Stopped (09/25/2017 1848)  . fentaNYL infusion INTRAVENOUS 100 mcg/hr (09/19/17 1000)  . heparin 2,200 Units/hr (09/19/17 1000)  . [START ON 10/06/2017] linezolid (ZYVOX) IV    . phenylephrine (NEO-SYNEPHRINE) Adult infusion 65 mcg/min (09/19/17 1007)   PRN  Meds:.acetaminophen **OR** acetaminophen, albuterol, fentaNYL, LORazepam, ondansetron **OR** ondansetron (ZOFRAN) IV    Objective: Weight change: 19 lb 9.9 oz (8.9 kg)  Intake/Output Summary (Last 24 hours) at 09/19/2017 1153 Last data filed at 09/19/2017 1000 Gross per 24 hour  Intake 2916.03 ml  Output 185 ml  Net 2731.03 ml   Blood pressure (!) 106/49, pulse 78, temperature 99.5 F (37.5 C), resp. rate (!) 21, height _0  (1.575 m), weight (!) 301 lb 13 oz (136.9 kg), last menstrual period 12/27/2013, SpO2 100 %. Temp:  [98.5 F (36.9 C)-102 F (38.9 C)] 99.5 F (37.5 C) (04/12 1129) Pulse Rate:  [63-102] 78 (04/12 1128) Resp:  [0-24] 21 (04/12 1128) BP: (78-148)/(25-72) 106/49 (04/12 1128) SpO2:  [93 %-100 %] 100 % (04/12 1128) FiO2 (%):  [40 %] 40 % (04/12 1128) Weight:  [301 lb 13 oz (136.9 kg)] 301 lb 13 oz (136.9 kg) (04/12 0500)  Physical Exam: General: Intubated  HEENT: anicteric sclera,EOMI intubated, moves  Head about CVS regular rate, normal r,  no murmur rubs or gallops Chest: clear to auscultation anteriorly  abdomen: soft, nondistended, normal bowel sounds, Extremities/skin: Left UE amputation site bandaged RUE: bandage RLE BKA bandage LLE: ankle with wrapping Neuro: no new focal neuro deficits  CBC:    BMET Recent Labs    09/28/2017 0515 09/19/17 0514  NA 132* 130*  K 3.7 4.2  CL 97* 96*  CO2 26 24  GLUCOSE 112* 123*  BUN 28* 50*  CREATININE 2.51* 3.43*  CALCIUM 7.4* 7.5*     Liver Panel  Recent Labs    09/17/17 0540 09/15/2017 0515 09/19/17 0514  PROT 5.6*  --  5.4*  ALBUMIN 1.3*  1.3* 1.2* 1.2*  AST 52*  --  88*  ALT 24  --  26  ALKPHOS 409*  --  416*  BILITOT 5.0*  --  5.0*  BILIDIR 3.7*  --   --   IBILI 1.3*  --   --        Sedimentation Rate No results for input(s): ESRSEDRATE in the last 72 hours. C-Reactive Protein No results for input(s): CRP in the last 72 hours.  Micro Results: Recent Results (from the past  720 hour(s))  Blood Culture (routine x 2)     Status: Abnormal   Collection Time: 08/22/2017  9:50 AM  Result Value Ref Range Status   Specimen Description BLOOD RIGHT HAND  Final   Special Requests   Final    BOTTLES DRAWN AEROBIC AND ANAEROBIC Blood Culture adequate volume  Culture  Setup Time   Final    GRAM POSITIVE COCCI IN BOTH AEROBIC AND ANAEROBIC BOTTLES CRITICAL RESULT CALLED TO, READ BACK BY AND VERIFIED WITH: G ABBOTT 09/06/17 0006 JDW    Culture (A)  Final    STAPHYLOCOCCUS AUREUS SUSCEPTIBILITIES PERFORMED ON PREVIOUS CULTURE WITHIN THE LAST 5 DAYS. Performed at [facility], [place] [address], [place], [place] [address]    Report Status 09/08/2017 FINAL  Final  Blood Culture (routine x 2)     Status: Abnormal   Collection Time: 08/23/2017  1:54 PM  Result Value Ref Range Status   Specimen Description BLOOD RIGHT FOREARM  Final   Special Requests   Final    BOTTLES DRAWN AEROBIC AND ANAEROBIC Blood Culture adequate volume   Culture  Setup Time   Final    GRAM POSITIVE COCCI IN BOTH AEROBIC AND ANAEROBIC BOTTLES CRITICAL RESULT CALLED TO, READ BACK BY AND VERIFIED WITH: Denton Brick PHARMD 09/06/17 0006 JDW Performed at [facility], [place] [address] [place], [place] [address]    Culture STAPHYLOCOCCUS AUREUS (A)  Final   Report Status 09/08/2017 FINAL  Final   Organism ID, Bacteria STAPHYLOCOCCUS AUREUS  Final      Susceptibility   Staphylococcus aureus - MIC*    CIPROFLOXACIN <=0.5 SENSITIVE Sensitive     ERYTHROMYCIN <=0.25 SENSITIVE Sensitive     GENTAMICIN <=0.5 SENSITIVE Sensitive     OXACILLIN 0.5 SENSITIVE Sensitive     TETRACYCLINE <=1 SENSITIVE Sensitive     VANCOMYCIN 1 SENSITIVE Sensitive     TRIMETH/SULFA <=10 SENSITIVE Sensitive     CLINDAMYCIN <=0.25 SENSITIVE Sensitive     RIFAMPIN <=0.5 SENSITIVE Sensitive     Inducible Clindamycin NEGATIVE Sensitive     * STAPHYLOCOCCUS AUREUS  Blood Culture ID Panel (Reflexed)     Status: Abnormal    Collection Time: 08/14/2017  1:54 PM  Result Value Ref Range Status   Enterococcus species NOT DETECTED NOT DETECTED Final   Listeria monocytogenes NOT DETECTED NOT DETECTED Final   Staphylococcus species DETECTED (A) NOT DETECTED Final    Comment: CRITICAL RESULT CALLED TO, READ BACK BY AND VERIFIED WITH: G ABBOTT PHARMD 09/06/17 0006 JDW    Staphylococcus aureus DETECTED (A) NOT DETECTED Final    Comment: Methicillin (oxacillin) susceptible Staphylococcus aureus (MSSA). Preferred therapy is anti staphylococcal beta lactam antibiotic (Cefazolin or Nafcillin), unless clinically contraindicated. CRITICAL RESULT CALLED TO, READ BACK BY AND VERIFIED WITH: G ABBOTT PHARMD 09/06/17 0006 JDW    Methicillin resistance NOT DETECTED NOT DETECTED Final   Streptococcus species NOT DETECTED NOT DETECTED Final   Streptococcus agalactiae NOT DETECTED NOT DETECTED Final   Streptococcus pneumoniae NOT DETECTED NOT DETECTED Final   Streptococcus pyogenes NOT DETECTED NOT DETECTED Final   Acinetobacter baumannii NOT DETECTED NOT DETECTED Final   Enterobacteriaceae species NOT DETECTED NOT DETECTED Final   Enterobacter cloacae complex NOT DETECTED NOT DETECTED Final   Escherichia coli NOT DETECTED NOT DETECTED Final   Klebsiella oxytoca NOT DETECTED NOT DETECTED Final   Klebsiella pneumoniae NOT DETECTED NOT DETECTED Final   Proteus species NOT DETECTED NOT DETECTED Final   Serratia marcescens NOT DETECTED NOT DETECTED Final   Haemophilus influenzae NOT DETECTED NOT DETECTED Final   Neisseria meningitidis NOT DETECTED NOT DETECTED Final   Pseudomonas aeruginosa NOT DETECTED NOT DETECTED Final   Candida albicans NOT DETECTED NOT DETECTED Final   Candida glabrata NOT DETECTED NOT DETECTED Final   Candida krusei NOT DETECTED  NOT DETECTED Final   Candida parapsilosis NOT DETECTED NOT DETECTED Final   Candida tropicalis NOT DETECTED NOT DETECTED Final    Comment: Performed at [facility], [place]  [address], [place], [place] [address]  Urine culture     Status: Abnormal   Collection Time: 08/14/2017  3:56 PM  Result Value Ref Range Status   Specimen Description URINE, RANDOM  Final   Special Requests   Final    NONE Performed at [facility], [place] [address], [place], [place] [address]    Culture 50,000 COLONIES/mL STAPHYLOCOCCUS AUREUS (A)  Final   Report Status 09/07/2017 FINAL  Final   Organism ID, Bacteria STAPHYLOCOCCUS AUREUS (A)  Final      Susceptibility   Staphylococcus aureus - MIC*    CIPROFLOXACIN <=0.5 SENSITIVE Sensitive     GENTAMICIN <=0.5 SENSITIVE Sensitive     NITROFURANTOIN 32 SENSITIVE Sensitive     OXACILLIN 0.5 SENSITIVE Sensitive     TETRACYCLINE <=1 SENSITIVE Sensitive     VANCOMYCIN 1 SENSITIVE Sensitive     TRIMETH/SULFA <=10 SENSITIVE Sensitive     CLINDAMYCIN <=0.25 SENSITIVE Sensitive     RIFAMPIN <=0.5 SENSITIVE Sensitive     Inducible Clindamycin NEGATIVE Sensitive     * 50,000 COLONIES/mL STAPHYLOCOCCUS AUREUS  MRSA PCR Screening     Status: None   Collection Time: 09/06/17  5:20 AM  Result Value Ref Range Status   MRSA by PCR NEGATIVE NEGATIVE Final    Comment:        The GeneXpert MRSA Assay (FDA approved for NASAL specimens only), is one component of a comprehensive MRSA colonization surveillance program. It is not intended to diagnose MRSA infection nor to guide or monitor treatment for MRSA infections. Performed at [facility], [place] [address], [place], [place] [address]   Culture, blood (routine x 2)     Status: Abnormal   Collection Time: 09/07/17  7:09 AM  Result Value Ref Range Status   Specimen Description BLOOD RIGHT HAND  Final   Special Requests AEROBIC BOTTLE ONLY Blood Culture adequate volume  Final   Culture  Setup Time   Final    GRAM POSITIVE COCCI AEROBIC BOTTLE ONLY CRITICAL VALUE NOTED.  VALUE IS CONSISTENT WITH PREVIOUSLY REPORTED AND CALLED VALUE.    Culture (A)  Final    STAPHYLOCOCCUS  AUREUS SUSCEPTIBILITIES PERFORMED ON PREVIOUS CULTURE WITHIN THE LAST 5 DAYS. Performed at [facility], [place] [address], [place], [place] [address]    Report Status 09/09/2017 FINAL  Final  Culture, blood (routine x 2)     Status: None   Collection Time: 09/07/17  7:17 AM  Result Value Ref Range Status   Specimen Description BLOOD LEFT ANTECUBITAL  Final   Special Requests   Final    IN BOTH AEROBIC AND ANAEROBIC BOTTLES Blood Culture results may not be optimal due to an excessive volume of blood received in culture bottles   Culture   Final    NO GROWTH 5 DAYS Performed at [facility], [place] [address] [place], [place] [address]    Report Status 09/12/2017 FINAL  Final  Aerobic Culture (superficial specimen)     Status: None   Collection Time: 09/08/17  3:10 PM  Result Value Ref Range Status   Specimen Description WOUND RIGHT TOE  Final   Special Requests   Final    NONE Performed at [facility], [place] [address],  [place], [place] [address]    Gram Stain   Final    RARE WBC PRESENT, PREDOMINANTLY MONONUCLEAR FEW GRAM POSITIVE COCCI ABUNDANT SQUAMOUS EPITHELIAL CELLS PRESENT    Culture ABUNDANT STAPHYLOCOCCUS AUREUS  Final   Report Status 09/10/2017 FINAL  Final   Organism ID, Bacteria STAPHYLOCOCCUS AUREUS  Final      Susceptibility   Staphylococcus aureus - MIC*    CIPROFLOXACIN <=0.5 SENSITIVE Sensitive     ERYTHROMYCIN <=0.25 SENSITIVE Sensitive     GENTAMICIN <=0.5 SENSITIVE Sensitive     OXACILLIN 0.5 SENSITIVE Sensitive     TETRACYCLINE <=1 SENSITIVE Sensitive     VANCOMYCIN <=0.5 SENSITIVE Sensitive     TRIMETH/SULFA <=10 SENSITIVE Sensitive     CLINDAMYCIN <=0.25 SENSITIVE Sensitive     RIFAMPIN <=0.5 SENSITIVE Sensitive     Inducible Clindamycin NEGATIVE Sensitive     * ABUNDANT STAPHYLOCOCCUS AUREUS  Body fluid culture     Status: None   Collection Time: 09/09/17  3:00 PM  Result Value Ref Range Status   Specimen Description SYNOVIAL LEFT  WRIST  Final   Special Requests NONE  Final   Gram Stain   Final    ABUNDANT WBC PRESENT, PREDOMINANTLY PMN MODERATE GRAM POSITIVE COCCI Performed at [facility], [place] [address] [place], [place] [address]    Culture FEW STAPHYLOCOCCUS AUREUS  Final   Report Status 09/12/2017 FINAL  Final   Organism ID, Bacteria STAPHYLOCOCCUS AUREUS  Final      Susceptibility   Staphylococcus aureus - MIC*    CIPROFLOXACIN <=0.5 SENSITIVE Sensitive     ERYTHROMYCIN <=0.25 SENSITIVE Sensitive     GENTAMICIN <=0.5 SENSITIVE Sensitive     OXACILLIN 0.5 SENSITIVE Sensitive     TETRACYCLINE <=1 SENSITIVE Sensitive     VANCOMYCIN <=0.5 SENSITIVE Sensitive     TRIMETH/SULFA <=10 SENSITIVE Sensitive     CLINDAMYCIN <=0.25 SENSITIVE Sensitive     RIFAMPIN <=0.5 SENSITIVE Sensitive     Inducible Clindamycin NEGATIVE Sensitive     * FEW STAPHYLOCOCCUS AUREUS  Culture, blood (Routine X 2) w Reflex to ID Panel     Status: Abnormal   Collection Time: 09/13/2017  9:25 AM  Result Value Ref Range Status   Specimen Description BLOOD LEFT FOREARM  Final   Special Requests   Final    BOTTLES DRAWN AEROBIC AND ANAEROBIC Blood Culture adequate volume   Culture  Setup Time   Final    GRAM POSITIVE COCCI AEROBIC BOTTLE ONLY CRITICAL RESULT CALLED TO, READ BACK BY AND VERIFIED WITH: A MEYER,PHARMD AT [place] 09/29/2017 BY L BENFIELD Performed at [facility], [place] [address] [place], [place] [address]    Culture STAPHYLOCOCCUS AUREUS (A)  Final   Report Status 09/22/2017 FINAL  Final   Organism ID, Bacteria STAPHYLOCOCCUS AUREUS  Final      Susceptibility   Staphylococcus aureus - MIC*    CIPROFLOXACIN <=0.5 SENSITIVE Sensitive     ERYTHROMYCIN <=0.25 SENSITIVE Sensitive     GENTAMICIN <=0.5 SENSITIVE Sensitive     OXACILLIN 0.5 SENSITIVE Sensitive     TETRACYCLINE <=1 SENSITIVE Sensitive     VANCOMYCIN 1 SENSITIVE Sensitive     TRIMETH/SULFA <=10 SENSITIVE Sensitive     CLINDAMYCIN <=0.25 SENSITIVE  Sensitive     RIFAMPIN <=0.5 SENSITIVE Sensitive     Inducible Clindamycin NEGATIVE Sensitive     * STAPHYLOCOCCUS AUREUS  Culture, blood (single)     Status: None   Collection Time: 09/26/2017  9:32 AM  Result Value Ref Range Status   Specimen Description BLOOD RIGHT HAND  Final   Special Requests   Final    BOTTLES DRAWN AEROBIC ONLY Blood Culture adequate volume   Culture   Final    NO GROWTH 5 DAYS Performed at [facility], [address], [place], [place] [address]    Report Status 10/04/2017 FINAL  Final  Aerobic/Anaerobic Culture (surgical/deep wound)     Status: None   Collection Time: 09/12/17  1:53 PM  Result Value Ref Range Status   Specimen Description ABSCESS  Final   Special Requests Normal  Final   Gram Stain   Final    ABUNDANT WBC PRESENT, PREDOMINANTLY PMN ABUNDANT GRAM POSITIVE COCCI IN CLUSTERS    Culture   Final    FEW STAPHYLOCOCCUS AUREUS NO ANAEROBES ISOLATED Performed at [facility], [place] [address], [place], [place] [address]    Report Status 09/17/2017 FINAL  Final   Organism ID, Bacteria STAPHYLOCOCCUS AUREUS  Final      Susceptibility   Staphylococcus aureus - MIC*    CIPROFLOXACIN <=0.5 SENSITIVE Sensitive     ERYTHROMYCIN <=0.25 SENSITIVE Sensitive     GENTAMICIN <=0.5 SENSITIVE Sensitive     OXACILLIN 0.5 SENSITIVE Sensitive     TETRACYCLINE <=1 SENSITIVE Sensitive     VANCOMYCIN <=0.5 SENSITIVE Sensitive     TRIMETH/SULFA <=10 SENSITIVE Sensitive     CLINDAMYCIN <=0.25 SENSITIVE Sensitive     RIFAMPIN <=0.5 SENSITIVE Sensitive     Inducible Clindamycin NEGATIVE Sensitive     * FEW STAPHYLOCOCCUS AUREUS  Blood culture (routine x 2)     Status: None (Preliminary result)   Collection Time: 10/04/2017  7:17 AM  Result Value Ref Range Status   Specimen Description BLOOD LEFT ANTECUBITAL  Final   Special Requests   Final    BOTTLES DRAWN AEROBIC AND ANAEROBIC Blood Culture adequate volume   Culture   Final    NO GROWTH 3  DAYS Performed at [facility], [place] [address], [place], [place] [address]    Report Status PENDING  Incomplete  Aerobic/Anaerobic Culture (surgical/deep wound)     Status: None (Preliminary result)   Collection Time: 09/27/2017  5:00 PM  Result Value Ref Range Status   Specimen Description WOUND  Final   Special Requests LEFT ELBOW PT ON DAPTOMYCIN  Final   Gram Stain   Final    RARE WBC PRESENT, PREDOMINANTLY PMN FEW GRAM POSITIVE COCCI Performed at [facility], [place] [address], [place], [place] [address]    Culture   Final    MODERATE STAPHYLOCOCCUS AUREUS NO ANAEROBES ISOLATED; CULTURE IN PROGRESS FOR 5 DAYS    Report Status PENDING  Incomplete   Organism ID, Bacteria STAPHYLOCOCCUS AUREUS  Final      Susceptibility   Staphylococcus aureus - MIC*    CIPROFLOXACIN <=0.5 SENSITIVE Sensitive     ERYTHROMYCIN <=0.25 SENSITIVE Sensitive     GENTAMICIN <=0.5 SENSITIVE Sensitive     OXACILLIN 0.5 SENSITIVE Sensitive     TETRACYCLINE <=1 SENSITIVE Sensitive     VANCOMYCIN <=0.5 SENSITIVE Sensitive     TRIMETH/SULFA <=10 SENSITIVE Sensitive     CLINDAMYCIN <=0.25 SENSITIVE Sensitive     RIFAMPIN <=0.5 SENSITIVE Sensitive     Inducible Clindamycin NEGATIVE Sensitive     * MODERATE STAPHYLOCOCCUS AUREUS  Surgical PCR screen     Status: Abnormal   Collection Time: 09/17/17  8:56 PM  Result Value Ref Range Status   MRSA, PCR NEGATIVE NEGATIVE Final   Staphylococcus aureus POSITIVE (A) NEGATIVE Final    Comment: (NOTE) The Xpert SA Assay (FDA approved for NASAL specimens in patients 67 years of age and older), is one component of a comprehensive surveillance program. It is not intended to diagnose infection nor to guide or monitor treatment. Performed at [facility], [place] [address] [place], [place] [address]   Body fluid culture     Status: None (Preliminary result)   Collection Time: 09/30/2017  9:47 AM  Result Value Ref Range Status   Specimen Description  SYNOVIAL RIGHT ELBOW  Final   Special Requests PATIENT ON FOLLOWING DAPTOMYCIN SPEC A  Final   Gram Stain   Final    RARE WBC PRESENT, PREDOMINANTLY MONONUCLEAR NO ORGANISMS SEEN    Culture   Final    NO GROWTH < 24 HOURS Performed at [name], [place] [address] [place], [place] [address]    Report Status PENDING  Incomplete  Aerobic/Anaerobic Culture (surgical/deep wound)     Status: None (Preliminary result)   Collection Time: 09/08/2017 10:21 AM  Result Value Ref Range Status   Specimen Description ABSCESS RIGHT ELBOW BURSA  Final   Special Requests SPEC B PATIENT ON FOLLOWING DAPTOMYCIN  Final   Gram Stain   Final    RARE WBC PRESENT, PREDOMINANTLY PMN FEW GRAM POSITIVE COCCI IN PAIRS IN CLUSTERS Performed at [facility], [place] [address], [place], [place] [address]    Culture PENDING  Incomplete   Report Status PENDING  Incomplete  Aerobic/Anaerobic Culture (surgical/deep wound)     Status: None (Preliminary result)   Collection Time: 10/07/2017 10:39 AM  Result Value Ref Range Status   Specimen Description ABSCESS LEFT HEEL  Final   Special Requests SPEC C PATIENT ON FOLLOWING DAPTOMYCIN  Final   Gram Stain   Final    RARE WBC PRESENT, PREDOMINANTLY PMN FEW GRAM POSITIVE COCCI IN PAIRS Performed at [facility], [place] [address], [place], [place] [address]    Culture PENDING  Incomplete   Report Status PENDING  Incomplete  Aerobic Culture (superficial specimen)     Status: None (Preliminary result)   Collection Time: 09/24/2017 10:43 AM  Result Value Ref Range Status   Specimen Description WOUND LEFT ANKLE PUS  Final   Special Requests SPEC D PATIENT ON FOLLOWING DAPTOMYCIN  Final   Gram Stain   Final    RARE WBC PRESENT, PREDOMINANTLY PMN FEW GRAM POSITIVE COCCI IN PAIRS Performed at [facility], [place] [address], [place], [place] [address]    Culture PENDING  Incomplete   Report Status PENDING  Incomplete  Aerobic Culture (superficial specimen)      Status: None (Preliminary result)   Collection Time: 09/30/2017 10:44 AM  Result Value Ref Range Status   Specimen Description WOUND LEFT ANKLE DEEP PUS  Final   Special Requests SPEC E PATIENT ON FOLLOWING DAPTOMYCIN  Final   Gram Stain   Final    RARE WBC PRESENT, PREDOMINANTLY PMN RARE GRAM POSITIVE COCCI IN PAIRS RARE GRAM NEGATIVE RODS Performed at [facility], [place] [address], [place], [place] [address]    Culture PENDING  Incomplete   Report Status PENDING  Incomplete  Aerobic/Anaerobic Culture (surgical/deep wound)     Status: None (Preliminary result)   Collection Time: 09/26/2017 10:53 AM  Result Value Ref Range Status   Specimen Description  ABSCESS RIGHT ELBOW DEEP PUS  Final   Special Requests SPEC F PATIENT ON FOLLOWING DAPTOMYCIN  Final   Gram Stain   Final    RARE WBC PRESENT, PREDOMINANTLY PMN ABUNDANT GRAM POSITIVE COCCI Performed at [facility], [address] [place], [place] [address]    Culture PENDING  Incomplete   Report Status PENDING  Incomplete    Studies/Results: Dg Chest Port 1 View  Result Date: 09/19/2017 CLINICAL DATA:  Status post intubation EXAM: PORTABLE CHEST 1 VIEW COMPARISON:  09/17/2017 FINDINGS: Endotracheal tube, nasogastric catheter and right jugular central line are again noted and stable. Cardiac shadow is enlarged but stable. Right pleural effusion with basilar at opacities are seen. Similar changes are noted on the left. Stable vascular congestion is noted. IMPRESSION: Stable vascular congestion with bibasilar opacities and effusions. Tubes and lines as described. Electronically Signed   By: Inez Catalina M.D.   On: 09/19/2017 07:32      Assessment/Plan:  INTERVAL HISTORY:   That is post extensive surgery by Dr. Marcelino Scot yesterday  CPK elevated today and we will need to change to Zyvox if we can use it versus vancomycin    Principal Problem:   MSSA bacteremia Active Problems:   Thalassemia   Rheumatoid arthritis  ([place])   Morbid obesity (HCC)   Acute back pain   Essential hypertension   Diabetes ([place])   Dyslipidemia   Severe sepsis with septic shock ([place])   Acute kidney injury ([place])   Immunosuppression due to drug therapy   Rheumatoid arthritis(714.0)   Septic joint of left wrist ([place])   Osteomyelitis of great toe of right foot ([place])   Hypertension   Hyperlipidemia   Acute hearing loss of left ear   Diabetes mellitus, insulin dependent (IDDM), controlled (HCC)   Beta thalassemia (HCC)   Acute anemia   Elevated liver enzymes   Hyperbilirubinemia   Altered mental status, unspecified   Atrial flutter (HCC)   Infection of right hand   Abscess   Acute renal failure (HCC)   Acute respiratory failure (HCC)   Encephalopathy   Endotracheally intubated   Staphylococcal arthritis of right wrist (HCC)   Facet joint disease   Pleural effusion   Left wrist pain   Goals of care, counseling/discussion   Palliative care by specialist    Jessica Howard is a 57 y.o. female with  RA  Admitted with MSSA B with septic shock, ARF, shock liver with evidence of endocarditis and "metastatic infection:"With severe infection of her right ankle and toe that will require a below the knee amputation extensive infection in bilateral wrists left worse than right status post surgery by Dr. Amedeo Plenty, also with L2-L3 septic facet joint with pus collection that has been drained.  Yesterday she went to the operating room and had an extensive surgery performed by Dr. Marcelino Scot including:  1. AMPUTATION HAND (Left) THROUGH THE FOREARM 2. IRRIGATION AND DEBRIDEMENT OF RIGHT ELBOW SEPTIC BURSITIS 3. REPEAT IRRIGATION AND DEBRIDEMENT OF LEFT ELBOW SEPTIC BURSITIS 4. IRRIGATION AND DEBRIDEMENT LEFT ANKLE SOFT TISSUE ABSCESS 5. IRRIGATION AND DEBRIDEMENT right hand and thumb (Right) 6. ASPIRATION RIGHT ELBOW JOINT 7. ASPIRATION OF LEFT ANKLE JOINT 8. ASPIRATION OF RIGHT ELBOW OLECRANON BURSA 9. DRESSING CHANGE UNDER  ANESTHESIA, right lower residual limb (Right)     #1 MSSAB with shock metastatic disease:       [facility] Staphylococcus aureus bacteremia   Staphylococcus aureus bacteremia (SAB) is associated with a  high rate of complications and mortality.  Specific aspects of clinical management are critical to optimizing the outcome of patients with SAB.  Therefore, the [facility] Antimicrobial Management Team [facility]) has initiated an intervention aimed at improving the management of SAB at [facility].  To do so, Infectious Diseases physicians are providing an evidence-based consult for the management of all patients with SAB.     Yes No Comments  Perform follow-up blood cultures (even if the patient is afebrile) to ensure clearance of bacteremia _0  _1  10/01/2017 ARE growing 1/2 sites unfortunately, repeat cultures taken on 09/21/2017 not growing  CENTRAL LINE REMOVAL blood cultures done to prove clearance with lines gone not sure that is going to something that can be done  Remove vascular catheter and obtain follow-up blood cultures after the removal of the catheter _2  _3  HD line holiday when possible but I do not see her surviving to go through this  Perform echocardiography to evaluate for endocarditis (transthoracic ECHO is 40-50% sensitive, TEE is > 90% sensitive) _4  _5  Please keep in mind, that neither test can definitively EXCLUDE endocarditis, and that should clinical suspicion remain high for endocarditis the patient should then still be treated with an "endocarditis" duration of therapy = 6 weeks  Thickened leaflets on the mitral valve but without clear-cut vegetation  Consult electrophysiologist to evaluate implanted cardiac device (pacemaker, ICD) _6  _7    Ensure source control _8  _9  Have all abscesses been drained effectively? Have deep seeded infections (septic joints or osteomyelitis) had appropriate surgical debridement?    She has had extensive surgeries.  On  10/04/2017 she did have aspiration of the right and left elbow joints and right synovial fluid in ONE screen in Epic appears to show [facility] seen on [place] I am trying to clarify with micro because if infected would require I and D arthrotomy of joint  Investigate for "metastatic" sites of infection _10  _11  Does the patient have ANY symptom or physical exam finding that would suggest a deeper infection (back or neck pain that may be suggestive of vertebral osteomyelitis or epidural abscess, muscle pain that could be a symptom of pyomyositis)?  Keep in mind that for deep seeded infections MRI imaging with contrast is preferred rather than other often insensitive tests such as plain x-rays, especially early in a patient's presentation.  She has had aggressive surgery by Dr. Amedeo Plenty and Mardelle Matte and Dr. Marcelino Scot  See discussion below   Change antibiotic therapy to zyvox tomorrow night if platelets will allow, if not return to vancomycin _12  _13  Beta-lactam antibiotics are preferred for MSSA due to higher cure rates.   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 This is due to MSSA that is EVERYWHERE #5 left septic bursitis status post I&D due to MSSA #6 right hand and thumb infection status post I&D ""  #7   Right septic bursitis status post I&D ""  #8 Concern for right septic elbow given GPC on GStain? I am trying to get teeth in the microbiology lab this should be more clear as the cultures come back.  If she indeed has infection in the elbow joint this would  require a formal I&D of that joint by orthopedic surgery if we are still trying to go for aggressive  care and cure of her metastatic infection.  #9 Left ankle infection sp I and D and GS shwing rare gram-positive cocci in pairs and some rare gram-negative rods: I  Added cirpo  given this 1 rare gram-negative rod that is seen though I do not think this is the problem the problem is this patient has severe methicillin sensitive staph aureus destructive pathology.   #10  LBP: facet jiont infection as post IR guided drainage came back for discussion of goals of care and prognosis with Dr. Tarri Abernethy and patients family including her son, sister and mother.  Goals of care: Unfortunately I do not think that Mrs. Takemoto is going to survive this hospitalization.  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   care that she is getting.  I greatly appreciate the excellent care of the critical care team cardiology nephrology and multidisciplinary orthopedic surgery including doctors Hilton Sinclair and now [facility]  Dr. Linus Salmons is covering this weekend.       LOS: 14 days   Alcide Evener 09/19/2017, 11:53 AM

## 2017-09-19 NOTE — Progress Notes (Signed)
ANTICOAGULATION CONSULT NOTE - Follow Up Consult  Pharmacy Consult for Heparin Indication: atrial fibrillation  Allergies  Allergen Reactions  . Bactrim [Sulfamethoxazole-Trimethoprim] Hives  . Cefuroxime Axetil Other (See Comments)    Blistering wounds per family  . Cephalosporins Other (See Comments)    Blistering wounds as an adult per family  . Lisinopril Cough  . Penicillins Hives  . Iohexol Itching and Rash     Code: RASH, Desc: HAD ITCHING AND A RASH ABOUT ONE HOUR AFTER RETURNING HOME FROM THE CT, Onset Date: [id number]   . Sulfa Antibiotics Hives    Patient Measurements: Height: 5\' 2"  (157.5 cm) Weight: (!) 301 lb 13 oz (136.9 kg) IBW/kg (Calculated) : 50.1 Heparin Dosing Weight: 81 kg  Vital Signs: Temp: 101.1 F (38.4 C) (04/12 1944) Temp Source: Oral (04/12 1944) BP: 102/52 (04/12 2100) Pulse Rate: 78 (04/12 2100)  Labs: Recent Labs    09/17/17 0540  10/01/2017 0515 09/19/17 0257 09/19/17 0514 09/19/17 1222 09/19/17 2103  HGB 8.8*  --  7.5*  --  7.9*  --   --   HCT 27.3*  --  23.1*  --  24.5*  --   --   PLT 160  --  213  --  197  --   --   HEPARINUNFRC  --    < > 0.23* <0.10*  --  0.16* <0.10*  CREATININE 3.13*  --  2.51*  --  3.43*  --   --   CKTOTAL 439*  --   --   --  1,911*  --   --    < > = values in this interval not displayed.    Estimated Creatinine Clearance: 24.8 mL/min (A) (by C-G formula based on SCr of 3.43 mg/dL (H)).   Assessment: 70 yoF with disseminated MSSA and rapid AFib now s/p DCCV on 4/4. Pt has been on heparin intermittently in between holding for surgeries, resumed 4/11 afternoon.   Hep lvl now undetectable Infusing with no issues   Goal of Therapy:  Heparin level 0.3-0.7 units/ml Monitor platelets by anticoagulation protocol: Yes   Plan:  Bolus 2000 units x 1 heparin Increase gtt to 2600 units/hr Next lv am labs F/U plans to hold for OR  Levester Fresh, PharmD, BCPS, BCCCP Clinical Pharmacist Clinical phone for  09/19/2017 from 1430 - 2300: [phone] If after 2300, please call main pharmacy at: [phone] 09/19/2017 10:11 PM

## 2017-09-19 NOTE — Progress Notes (Signed)
ANTICOAGULATION CONSULT NOTE - Follow Up Consult  Pharmacy Consult for Heparin Indication: atrial fibrillation  Allergies  Allergen Reactions  . Bactrim [Sulfamethoxazole-Trimethoprim] Hives  . Cefuroxime Axetil Other (See Comments)    Blistering wounds per family  . Cephalosporins Other (See Comments)    Blistering wounds as an adult per family  . Lisinopril Cough  . Penicillins Hives  . Iohexol Itching and Rash     Code: RASH, Desc: HAD ITCHING AND A RASH ABOUT ONE HOUR AFTER RETURNING HOME FROM THE CT, Onset Date: [id number]   . Sulfa Antibiotics Hives    Patient Measurements: Height: 5\' 2"  (157.5 cm) Weight: (!) 301 lb 13 oz (136.9 kg) IBW/kg (Calculated) : 50.1 Heparin Dosing Weight: 81 kg  Vital Signs: Temp: 99.5 F (37.5 C) (04/12 1129) Temp Source: Oral (04/12 0712) BP: 115/44 (04/12 1200) Pulse Rate: 78 (04/12 1200)  Labs: Recent Labs    09/17/17 0540  10/07/2017 0515 09/19/17 0257 09/19/17 0514 09/19/17 1222  HGB 8.8*  --  7.5*  --  7.9*  --   HCT 27.3*  --  23.1*  --  24.5*  --   PLT 160  --  213  --  197  --   HEPARINUNFRC  --    < > 0.23* <0.10*  --  0.16*  CREATININE 3.13*  --  2.51*  --  3.43*  --   CKTOTAL 439*  --   --   --  1,911*  --    < > = values in this interval not displayed.    Estimated Creatinine Clearance: 24.8 mL/min (A) (by C-G formula based on SCr of 3.43 mg/dL (H)).   Assessment: 50 yoF with disseminated MSSA and rapid AFib now s/p DCCV on 4/4. Pt has been on heparin intermittently in between holding for surgeries, resumed 4/11 afternoon. Heparin level subtherapeutic at 0.16, CBC low but stable.  Goal of Therapy:  Heparin level 0.3-0.7 units/ml Monitor platelets by anticoagulation protocol: Yes   Plan:  -Increase heparin to 2400 units/hr -Recheck 8-hr heparin level -Daily CBC, heparin level  Arrie Senate, PharmD, BCPS PGY-2 Cardiology Pharmacy Resident Pager: [phone] 09/19/2017

## 2017-09-19 NOTE — Progress Notes (Signed)
RN notified of pt temp of 100.6 F orally. 

## 2017-09-19 NOTE — Progress Notes (Signed)
Patient seen and examined.  Her dressings are clean.  Other notes in the chart noted.  She was febrile overnight, has required increased pressors.  Infection peripherally continues to spread.  Involvement now present in the right elbow, left elbow, left posterior heel.  The joints of the elbows seem clear from the fluid from the aspirate yesterday.  I had a discussion with Antonio, the Caro and son, regarding her options.  He is struggling under the weight of medical decision making, and is not sure the best course of action from future management, and is seeking more guidance.  I had a discussion with Dr. Tarri Abernethy with critical care, and the patient and family may benefit from palliative care discussions, certainly we have been in a critical situation for weeks now.  I am going to plan for bilateral upper extremity and left lower extremity repeat washout tomorrow, depending on the discussion with palliative care according to the family.  I have discussed this with Antonio and we are going to schedule this for Saturday, but may change course depending on the family's wishes and overall treatment goals.  Johnny Bridge, MD

## 2017-09-19 NOTE — Progress Notes (Signed)
Patient ID: Jessica Howard, female   DOB: 1960-12-17, 57 y.o.   MRN: [id number] [facility] Progress Note   Assessment/ Plan:    1. Acute kidney injury: Likely from ATN associated with sepsis, remains anuric and without any evident renal recovery.  On review of her labs/clinical status-she does not have any acute indications for hemodialysis/renal replacement therapy at this time.  Overall prognosis remains exceedingly poor with severe metastatic infection. 2. MSSA septicemia with diffuseseeding of joints/spine: S/P right BKA and left hand at forearm level amputation for osteomyelitis/joint+ fascial space infection and repeat multifocal irrigation/debridement and exploratory fasciotomy-appreciate input from orthopedic surgery.  Remains on daptomycin and ciprofloxacin-appreciate input from infectious diseases. 3. Atrial fibrillation currently sinus rhythm and rate controlled on amiodarone s/p DCCV. 4. Abnormal LFT's-likely shock liver associated with bacteremia/sepsis-transaminases continue to trend down with hypoalbuminemia likely a negative acute phase reactant. 5. Anemia of acute illness- follow and transfuse prn 6. Hyponatremia-secondary to acute kidney injury/free water excretion defect in this anuric patient, continue to monitor and limit free water. 7. Rheumatoid arthritis    Subjective:   Status post left hand amputation through the forearm level with additional irrigation and debridement of multiple joints/soft tissue yesterday.  Remains febrile overnight and on pressors.   Objective:   BP (!) 123/56   Pulse 79   Temp (!) 100.6 F (38.1 C) (Oral)   Resp (!) 6   Ht 5' 2"  (1.575 m)   Wt (!) 136.9 kg (301 lb 13 oz)   LMP 12/27/2013 Comment: irreggular  SpO2 100%   BMI 55.20 kg/m   Intake/Output Summary (Last 24 hours) at 09/19/2017 0740 Last data filed at 09/19/2017 0700 Gross per 24 hour  Intake 2931.23 ml  Output 185 ml  Net 2746.23 ml   Weight change: 8.9  kg (19 lb 9.9 oz)  Physical Exam: Gen: Intubated, sleeping comfortably CVS: Pulse regular rhythm, normal rate, S1 and S2 normal Resp: Mechanical breath sounds bilaterally, no distinct rales or rhonchi Abd: Soft, obese, nontender Ext: Right arm in dressing, s/p R BKA, status post left hand amputation at forearm level  Imaging: Dg Chest Port 1 View  Result Date: 09/19/2017 CLINICAL DATA:  Status post intubation EXAM: PORTABLE CHEST 1 VIEW COMPARISON:  09/17/2017 FINDINGS: Endotracheal tube, nasogastric catheter and right jugular central line are again noted and stable. Cardiac shadow is enlarged but stable. Right pleural effusion with basilar at opacities are seen. Similar changes are noted on the left. Stable vascular congestion is noted. IMPRESSION: Stable vascular congestion with bibasilar opacities and effusions. Tubes and lines as described. Electronically Signed   By: Inez Catalina M.D.   On: 09/19/2017 07:32    Labs: BMET Recent Labs  Lab 09/13/17 1633 09/19/2017 0313 09/15/17 0408 09/15/17 1452 09/09/2017 0500 09/26/2017 1731 09/17/17 0540 09/27/2017 0515 09/19/17 0514  NA 134* 133* 132* 132* 131* 137 133* 132* 130*  K 3.9 4.1 4.5 4.7 5.7* 4.7 5.1 3.7 4.2  CL 100* 99* 100* 99* 101  --  99* 97* 96*  CO2 23 23 18* 21* 19*  --  23 26 24   GLUCOSE 157* 130* 117* 108* 112* 89 118* 112* 123*  BUN 23* 18 29* 32* 42*  --  36* 28* 50*  CREATININE 1.89* 1.65* 2.74* 3.14* 4.06*  --  3.13* 2.51* 3.43*  CALCIUM 7.9* 7.8* 7.5* 7.7* 7.5*  --  7.5* 7.4* 7.5*  PHOS 3.3 3.4  3.4 3.7  --  7.0*  --  6.6* 4.0 4.4  CBC Recent Labs  Lab 10/01/2017 0335 10/05/2017 1731 09/17/17 0540 09/17/2017 0515 09/19/17 0514  WBC 15.4*  --  14.2* 16.7* 14.0*  HGB 7.9* 7.5* 8.8* 7.5* 7.9*  HCT 25.5* 22.0* 27.3* 23.1* 24.5*  MCV 75.9*  --  79.8 81.9 83.9  PLT 178  --  160 213 197    Medications:    . amiodarone  200 mg Oral BID  . chlorhexidine gluconate (MEDLINE KIT)  15 mL Mouth Rinse BID  .  Chlorhexidine Gluconate Cloth  6 each Topical Daily  . docusate  100 mg Per Tube BID  . feeding supplement (PRO-STAT SUGAR FREE 64)  60 mL Per Tube TID  . feeding supplement (VITAL HIGH PROTEIN)  1,000 mL Per Tube Q24H  . insulin aspart  0-15 Units Subcutaneous Q4H  . lactulose  10 g Oral Daily  . mouth rinse  15 mL Mouth Rinse 10 times per day  . mupirocin ointment  1 application Nasal BID  . pantoprazole (PROTONIX) IV  40 mg Intravenous Q24H  . sennosides  5 mL Per Tube Daily  . sodium chloride flush  3 mL Intravenous Q12H   Elmarie Shiley, MD 09/19/2017, 7:40 AM

## 2017-09-19 NOTE — Consult Note (Addendum)
Consultation Note Date: 09/19/2017   Patient Name: Jessica Howard  DOB: 04/15/1961  MRN: [id number]  Age / Sex: 57 y.o., female  PCP: Merrilee Seashore, MD Referring Physician: Brand Males, MD  Reason for Consultation: Establishing goals of care and Psychosocial/spiritual support  HPI/Patient Profile: 57 y.o. female  with past medical history of rheumatoid arthritis on immunosuppressants, hypertension, diabetes, chronic pain, neuropathy, hyperlipidemia admitted on 08/10/2017 with diffuse, back pain, confusion, decreased p.o. intake, and decreased urine output.  Patient had been experiencing back pain for approximately 2 weeks prior to admission..  She has since been found to have MSSA sepsis with diffuse seeding of joints and spine.  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  She also has a area to her left heel that is concerning.  She now is in acute kidney injury, with a creatinine of 3.36; prior to this had been within normal limits.  She is been undergoing I&D and also she required cardioversion secondary to atrial fib with RVR  Consult ordered for goals of care, care limits  Clinical Assessment and Goals of Care: Patient seen, chart reviewed.  Met with patient's son, Jessica Howard.  Mr. Gauger seems well informed in terms of his mother's unfortunate, complicated situation.  Patient has 2 children, Jessica Howard and [employer].  Her mother is still living and she has 2 sisters and a brother who have been actively engaged in her plan of care at the hospital.  She is currently intubated and on pressors, as well as antibiotics.  Patient is unable to speak for herself at this point secondarily to being intubated as well as critically ill.  Her children, Jessica and [name] would be her healthcare proxy [name]  [phone] Burman Foster [phone]   Spoke at length to [facility] who is struggling with how ill his mother is in the decisions that he has had to make.  Reiterated to [place] that these decisions ultimately are about an overwhelming infection and attempted to support the decisions he has made thus far.  Some of the questions we did begin to discuss again were: Further amputations, if she is able to wean would she want to be reintubated, as well as CODE STATUS in the event of decline.  Jessica is leaning towards no further amputation, states she would not want a trach or PEG but is also desirous of maintaining dignity and quality of life and doing everything for her.  He is also hopeful to get her sisters as well as her brother and her daughter involved in further goals of care discussion, a consensus  He describes his mother despite a lot of hardships in her life as being "a woman of God" and always worried more about others than herself.  She has been on disability for approximately 25 years.  She is a person that her church community has been very important to her as well as her family and children.  At  this point he thinks she could find quality of life with the amputations that she is undergone thus far but does not feel that if this infection were to get worse that he would pursue further amputations  In speaking with critical care medicine we likely will have a better idea if she is going to be able to clear this infection early next week      SUMMARY OF RECOMMENDATIONS   Full scope of treatment for now He is leaning towards no further amputations no trach no PEG but is struggling with if she is able to wean would he want her reintubated.  He also is desirous of a family meeting in order to help him reach a consensus regarding further goals of care He is asked me to try to reach out to patient's sister, Jessica Howard at [phone] to arrange a family meeting.  I did attempt to reach Ms.  [name] but there was no answer at this number.  We will continue to try to reach out to her or her sister Jessica Howard  Addendum 1649: Appt set with extended family members for 4/13 2pm Code Status/Advance Care Planning:  Full code    Symptom Management:   Pain: Continue fentanyl and continuous infusion as prescribed by critical care medicine  Palliative Prophylaxis:   Aspiration, Bowel Regimen, Delirium Protocol, Eye Care, Frequent Pain Assessment, Oral Care and Turn Reposition  Additional Recommendations (Limitations, Scope, Preferences):  Full Scope Treatment  Psycho-social/Spiritual:   Desire for further Chaplaincy support:no  Additional Recommendations: Grief/Bereavement Support  Prognosis:   Unable to determine  Discharge Planning: To Be Determined      Primary Diagnoses: Present on Admission: . Acute kidney injury ([place]) . Rheumatoid arthritis(714.0) . Septic joint of left wrist ([place]) . Osteomyelitis of great toe of right foot ([place]) . Hypertension . Hyperlipidemia . Acute hearing loss of left ear . Beta thalassemia ([place]) . Rheumatoid arthritis ([place]) . Morbid obesity ([place]) . Dyslipidemia . Essential hypertension . Thalassemia . Elevated liver enzymes . Hyperbilirubinemia . Acute back pain   I have reviewed the medical record, interviewed the patient and family, and examined the patient. The following aspects are pertinent.  Past Medical History:  Diagnosis Date  . Asthma   . Beta thalassemia w/chronic anemia    Thalassemia  . Diabetes mellitus type 2, insulin dependent (HCC)    type 2  . Diabetic neuropathy ([place])   . Endometrial polyp   . Fibroadenoma of breast    left  . GERD (gastroesophageal reflux disease)   . Headache    migraines  . Hearing loss, right   . Hepatic steatosis   . Hyperlipidemia   . Hypertension   . Immunocompromised state due to drug therapy   . Impingement syndrome of right shoulder   . Nontraumatic tear of right  rotator cuff   . Pneumonia   . Psoriasis   . Rheumatoid arthritis(714.0)    rhematoid and osteoarthritis  . Sleep apnea   . Thyroid disease    had radiation   Social History   Socioeconomic History  . Marital status: Divorced    Spouse name: Not on file  . Number of children: Not on file  . Years of education: Not on file  . Highest education level: Not on file  Occupational History  . Not on file  Social Needs  . Financial resource strain: Patient refused  . Food insecurity:    Worry: Patient refused    Inability: Patient refused  .  Transportation needs:    Medical: Patient refused    Non-medical: Patient refused  Tobacco Use  . Smoking status: Former Smoker    Packs/day: 0.25    Years: 6.00    Pack years: 1.50    Types: Cigarettes    Last attempt to quit: 06/09/2013    Years since quitting: 4.2  . Smokeless tobacco: Never Used  Substance and Sexual Activity  . Alcohol use: No  . Drug use: No  . Sexual activity: Yes    Birth control/protection: Surgical  Lifestyle  . Physical activity:    Days per week: Patient refused    Minutes per session: Patient refused  . Stress: Patient refused  Relationships  . Social connections:    Talks on phone: Patient refused    Gets together: Patient refused    Attends religious service: Patient refused    Active member of club or organization: Patient refused    Attends meetings of clubs or organizations: Patient refused    Relationship status: Patient refused  Other Topics Concern  . Not on file  Social History Narrative  . Not on file   Family History  Problem Relation Age of Onset  . Diabetes Mother   . Cancer Mother        breast  . Aneurysm Father        brain  . Diabetes Sister   . Cancer Brother        bone marrow cancer   Scheduled Meds: . [START ON 09/13/2017] amiodarone  200 mg Oral Daily  . chlorhexidine gluconate (MEDLINE KIT)  15 mL Mouth Rinse BID  . Chlorhexidine Gluconate Cloth  6 each Topical  Daily  . docusate  100 mg Per Tube BID  . feeding supplement (PRO-STAT SUGAR FREE 64)  60 mL Per Tube TID  . feeding supplement (VITAL HIGH PROTEIN)  1,000 mL Per Tube Q24H  . insulin aspart  0-15 Units Subcutaneous Q4H  . lactulose  10 g Oral Daily  . mouth rinse  15 mL Mouth Rinse 10 times per day  . mupirocin ointment  1 application Nasal BID  . pantoprazole (PROTONIX) IV  40 mg Intravenous Q24H  . sennosides  5 mL Per Tube Daily  . sodium chloride flush  3 mL Intravenous Q12H   Continuous Infusions: . sodium chloride 10 mL/hr at 09/19/17 1000  . ciprofloxacin Stopped (09/30/2017 1848)  . fentaNYL infusion INTRAVENOUS 100 mcg/hr (09/19/17 1000)  . heparin 2,200 Units/hr (09/19/17 1000)  . [START ON 10/06/2017] linezolid (ZYVOX) IV    . phenylephrine (NEO-SYNEPHRINE) Adult infusion 65 mcg/min (09/19/17 1007)   PRN Meds:.acetaminophen **OR** acetaminophen, albuterol, fentaNYL, LORazepam, ondansetron **OR** ondansetron (ZOFRAN) IV Medications Prior to Admission:  Prior to Admission medications   Medication Sig Start Date End Date Taking? Authorizing Provider  abatacept (ORENCIA) 250 MG injection Inject 1,000 mg into the vein every 28 (twenty-eight) days. Monthly Infusion   Yes [provider]  albuterol (PROVENTIL HFA;VENTOLIN HFA) 108 (90 Base) MCG/ACT inhaler Inhale 2 puffs into the lungs every 6 (six) hours as needed for wheezing or shortness of breath.   Yes [provider]  amitriptyline (ELAVIL) 25 MG tablet Take 25 mg by mouth at bedtime.    Yes [provider]  ARTIFICIAL TEAR OP Place 1 drop into both eyes as needed (for dry eyes).   Yes [provider]  BYSTOLIC 10 MG tablet Take 20 mg by mouth daily.  10/07/16  Yes [provider]  chlorthalidone (  HYGROTON) 25 MG tablet Take 25 mg by mouth daily.   Yes [provider]  CRESTOR 20 MG tablet Take 20 mg by mouth daily.  03/14/14  Yes [provider]  diltiazem (CARDIZEM  CD) 240 MG 24 hr capsule Take 240 mg by mouth daily.  02/03/14  Yes [provider]  fluticasone (FLONASE) 50 MCG/ACT nasal spray Place 1 spray into both nostrils daily as needed for allergies or rhinitis.   Yes [provider]  gabapentin (NEURONTIN) 300 MG capsule Take 600 mg by mouth 3 (three) times daily.    Yes [provider]  HYDROcodone-acetaminophen (NORCO/VICODIN) 5-325 MG tablet Take 1 tablet by mouth every 6 (six) hours as needed for moderate pain.   Yes [provider]  insulin aspart (NOVOLOG) 100 UNIT/ML injection Inject 50 Units into the skin 3 (three) times daily before meals. 55 base Plus sliding scale    Yes [provider]  insulin NPH (HUMULIN N,NOVOLIN N) 100 UNIT/ML injection Inject 75 Units into the skin 3 (three) times daily with meals. 75 units with breakfast; lunch 75 units; 75 units at dinner   Yes [provider]  irbesartan (AVAPRO) 300 MG tablet Take 300 mg by mouth daily.    Yes [provider]  ISOtretinoin (ACCUTANE) 40 MG capsule Take 40 mg by mouth daily.   Yes [provider]  leflunomide (ARAVA) 20 MG tablet Take 20 mg by mouth daily.   Yes [provider]  magnesium oxide (MAG-OX) 400 MG tablet Take 400 mg by mouth daily.   Yes [provider]  meclizine (ANTIVERT) 25 MG tablet Take 25 mg by mouth 3 (three) times daily as needed for dizziness.   Yes [provider]  metFORMIN (GLUCOPHAGE-XR) 500 MG 24 hr tablet Take 500 mg by mouth every evening.   Yes [provider]  pantoprazole (PROTONIX) 40 MG tablet Take 40 mg by mouth daily.   Yes [provider]  spironolactone (ALDACTONE) 25 MG tablet Take 25 mg by mouth daily. 08/27/17  Yes [provider]  topiramate (TOPAMAX) 100 MG tablet Take 100 mg by mouth at bedtime.  07/20/16  Yes [provider]  TRULICITY 1.69 CV/8.9FY SOPN Inject 0.75 mg into the skin once a week. Patient takes  on Wednesday. 08/29/15  Yes [provider]  Vitamin D, Ergocalciferol, (DRISDOL) 50000 units CAPS capsule Take 50,000 Units by mouth every 7 (seven) days.   Yes [provider]  VOLTAREN 1 % GEL Apply 1 application topically 3 (three) times daily as needed (for pain).  02/03/14  Yes [provider]  lidocaine (LIDODERM) 5 % Place 1 patch onto the skin daily. Remove & Discard patch within 12 hours or as directed by MD Patient not taking: Reported on 08/09/2017 08/27/17   Petrucelli, Glynda Jaeger, PA-C  methocarbamol (ROBAXIN) 500 MG tablet Take 2 tablets (1,000 mg total) by mouth 4 (four) times daily as needed (Pain). Patient not taking: Reported on 08/31/2017 09/04/17   Pisciotta, Elmyra Ricks, PA-C  naproxen (NAPROSYN) 500 MG tablet Take 1 tablet (500 mg total) by mouth 2 (two) times daily. Patient not taking: Reported on 08/21/2017 08/27/17   Petrucelli, Glynda Jaeger, PA-C   Allergies  Allergen Reactions  . Bactrim [Sulfamethoxazole-Trimethoprim] Hives  . Cefuroxime Axetil Other (See Comments)    Blistering wounds per family  . Cephalosporins Other (See Comments)    Blistering wounds as an adult per family  . Lisinopril Cough  . Penicillins Hives  .  Iohexol Itching and Rash     Code: RASH, Desc: HAD ITCHING AND A RASH ABOUT ONE HOUR AFTER RETURNING HOME FROM THE CT, Onset Date: [id number]   . Sulfa Antibiotics Hives   Review of Systems  Unable to perform ROS: Intubated    Physical Exam  Constitutional: She appears well-developed and well-nourished.  Critically ill-appearing older female; intubated  HENT:  Head: Normocephalic and atraumatic.  Cardiovascular: Normal rate.  Pulmonary/Chest:  Intubated Postop day 1, not initiating breaths on her own at this point  Genitourinary:  Genitourinary Comments: Foley  Musculoskeletal:  Left upper extremity status post amputation; dressing, clean dry and intact Right lower extremity below the knee amputation, dressing clean dry  and intact  Neurological:  Patient is intubated  Skin: Skin is warm and dry.  Psychiatric:  Unable to test  Nursing note and vitals reviewed.   Vital Signs: BP (!) 120/46   Pulse 73   Temp (!) 100.6 F (38.1 C) (Oral)   Resp (!) 0   Ht 5' 2"  (1.575 m)   Wt (!) 136.9 kg (301 lb 13 oz)   LMP 12/27/2013 Comment: irreggular  SpO2 100%   BMI 55.20 kg/m  Pain Scale: CPOT   Pain Score: 3    SpO2: SpO2: 100 % O2 Device:SpO2: 100 % O2 Flow Rate: .O2 Flow Rate (L/min): 3 L/min  IO: Intake/output summary:   Intake/Output Summary (Last 24 hours) at 09/19/2017 1128 Last data filed at 09/19/2017 1000 Gross per 24 hour  Intake 2916.03 ml  Output 185 ml  Net 2731.03 ml    LBM: Last BM Date: 09/26/2017 Baseline Weight: Weight: 117.9 kg (260 lb) Most recent weight: Weight: (!) 136.9 kg (301 lb 13 oz)     Palliative Assessment/Data:   Flowsheet Rows     Most Recent Value  Intake Tab  Referral Department  Critical care  Unit at Time of Referral  ICU  Palliative Care Primary Diagnosis  Sepsis/Infectious Disease  Date Notified  09/17/2017  Palliative Care Type  New Palliative care  Reason for referral  Clarify Goals of Care  Date of Admission  10/06/17  Date first seen by Palliative Care  09/19/17  # of days Palliative referral response time  1 Day(s)  # of days IP prior to Palliative referral  -18  Clinical Assessment  Palliative Performance Scale Score  20%  Pain Max last 24 hours  Not able to report  Pain Min Last 24 hours  Not able to report  Dyspnea Max Last 24 Hours  Not able to report  Dyspnea Min Last 24 hours  Not able to report  Nausea Max Last 24 Hours  Not able to report  Nausea Min Last 24 Hours  Not able to report  Anxiety Max Last 24 Hours  Not able to report  Anxiety Min Last 24 Hours  Not able to report  Other Max Last 24 Hours  Not able to report  Psychosocial & Spiritual Assessment  Palliative Care Outcomes  Patient/Family meeting held?  Yes  Who was at  the meeting?  son  Palliative Care follow-up planned  Yes, Facility      Time In: 1000 Time Out: 1115 Time Total: 75 min Greater than 50%  of this time was spent counseling and coordinating care related to the above assessment and plan. Staffed with Dr. Elsworth Soho  Signed by: Dory Horn, NP   Please contact Palliative Medicine Team phone at [phone] for questions and concerns.  For individual  provider: See Shea Evans

## 2017-09-19 NOTE — Progress Notes (Signed)
[facility] Progress Note  S: remains intubated       On pressors      cipro and linezolid ( to be started), was on daptomycin but this has been dc'd        Gram stains showing gram + cocci and GNRs   O: BP (!) 120/46   Pulse 73   Temp (!) 100.6 F (38.1 C) (Oral)   Resp (!) 0   Ht 5\' 2"  (1.575 m)   Wt (!) 136.9 kg (301 lb 13 oz)   LMP 12/27/2013 Comment: irreggular  SpO2 100%   BMI 55.20 kg/m   Results for DONITA, [place] (MRN [id number]) as of 09/19/2017 11:16  Ref. Range 09/19/2017 05:14 09/19/2017 05:21  Sample type Unknown  VENOUS  pH, Ven Latest Ref Range: 7.250 - 7.430   7.276  pCO2, Ven Latest Ref Range: 44.0 - 60.0 mmHg  62.1 (H)  pO2, Ven Latest Ref Range: 32.0 - 45.0 mmHg  36.0  TCO2 Latest Ref Range: 22 - 32 mmol/L  30  Acid-Base Excess Latest Ref Range: 0.0 - 2.0 mmol/L  1.0  Bicarbonate Latest Ref Range: 20.0 - 28.0 mmol/L  28.1 (H)  O2 Saturation Latest Units: %  50.0  Patient temperature Unknown  103.0 F  Collection site Unknown  Central Line  COMPREHENSIVE METABOLIC PANEL Unknown Rpt (A)   Sodium Latest Ref Range: 135 - 145 mmol/L 130 (L)   Potassium Latest Ref Range: 3.5 - 5.1 mmol/L 4.2   Chloride Latest Ref Range: 101 - 111 mmol/L 96 (L)   CO2 Latest Ref Range: 22 - 32 mmol/L 24   Glucose Latest Ref Range: 65 - 99 mg/dL 123 (H)   BUN Latest Ref Range: 6 - 20 mg/dL 50 (H)   Creatinine Latest Ref Range: 0.44 - 1.00 mg/dL 3.43 (H)   Calcium Latest Ref Range: 8.9 - 10.3 mg/dL 7.5 (L)   Anion gap Latest Ref Range: 5 - 15  10   Phosphorus Latest Ref Range: 2.5 - 4.6 mg/dL 4.4   Magnesium Latest Ref Range: 1.7 - 2.4 mg/dL 1.9   Alkaline Phosphatase Latest Ref Range: 38 - 126 U/L 416 (H)   Albumin Latest Ref Range: 3.5 - 5.0 g/dL 1.2 (L)   AST Latest Ref Range: 15 - 41 U/L 88 (H)   ALT Latest Ref Range: 14 - 54 U/L 26   Total Protein Latest Ref Range: 6.5 - 8.1 g/dL 5.4 (L)   Total Bilirubin Latest Ref Range: 0.3 - 1.2 mg/dL 5.0 (H)     GFR, Est Non African American Latest Ref Range: >60 mL/min 14 (L)   GFR, Est African American Latest Ref Range: >60 mL/min 16 (L)   CK Total Latest Ref Range: 38 - 234 U/L 1,911 (H)   WBC Latest Ref Range: 4.0 - 10.5 K/uL 14.0 (H)   RBC Latest Ref Range: 3.87 - 5.11 MIL/uL 2.92 (L)   Hemoglobin Latest Ref Range: 12.0 - 15.0 g/dL 7.9 (L)   HCT Latest Ref Range: 36.0 - 46.0 % 24.5 (L)   MCV Latest Ref Range: 78.0 - 100.0 fL 83.9   MCH Latest Ref Range: 26.0 - 34.0 pg 27.1   MCHC Latest Ref Range: 30.0 - 36.0 g/dL 32.2   RDW Latest Ref Range: 11.5 - 15.5 % 19.5 (H)   Platelets Latest Ref Range: 150 - 400 K/uL 197     Gen: Vent, opens eyes on occasion but does not track or focus  Exts:  Dressings to all exts are stable  L UEx dressing is c/d/i  R UEx dressing is c/d/i, finger splint to thumb is stable   PRAFO to L ankle   Dressing stable   Heel padded with dressing   Image of L wrist pre-op below     A/P  57 y/o female with disseminated MSSA infection  POD 1 L transradial amputation (below elbow), repeat I&D L olecranon bursitis   Dressing change tomorrow or Sunday   Continue to monitor  Felt we had clean margins in OR  Hopeful she will not need to return for revision   POD 1 I&D superficial abscess L posterior ankle   Did not go deep into joint. We aspirated joint in OR and did not get any purulence  Abscess in subcutaneous tissue  Continue to monitor   Dressing chang tomorrow or Sunday, can pull penrose at that time   [facility] or Prevalon boot to give pressure relief to heel   POD 1 I&D R elbow septic olecranon bursitis   Dressing change tomorrow or Sunday, pull penrose at that time   POD 1 repeat I&D R hand  Hand splint and bulky dressing applied to R thumb to keep it abducted to prevent further soft tissue maceration   Change dressing Saturday or Sunday. Would redress it in a similar fashion    S/p R BKA   Dressing changes as needed    Pt remains in critical  condition  Continue per other services Please call ortho with questions   Jari Pigg, PA-C [facility] [phone] (C) [phone] (O) 09/19/2017 11:30 AM

## 2017-09-19 NOTE — Progress Notes (Addendum)
PULMONARY  / CRITICAL CARE MEDICINE  Name: Jessica Howard MRN: [id number] DOB: 1960-11-15    LOS: 73  REFERRING MD :  Velvet Bathe MD  CHIEF COMPLAINT:  Back Pain   BRIEF PATIENT DESCRIPTION: Patient is a 57 y.o female with RA on immunosuppressive therapy (Levunomide and Abatacept) and DM who presented to the ED with 2 weeks of progressive myalgias and arthralgias. Initial work-up was significant for AKI, HAGMA, elevated liver enzymes, lactic acidosis, leukocytosis, and acute on chronic anemia. Blood cultures subsequently grew MSSA (4/4). Initial TTE was negative for features of endocarditis; however, patient has pathologic PE findings including splinter hemorrhages and Olser nodes. Her renal function worsened over the course of her hospitalization and she is requiring CRRT. The patient also converted to atrial fibrillation and was placed on amiodarone, diltiazem, and lopressor. MRI of the right foot illustrated septic arthritis of the right first IP with associated osteomyelitis. MRI of the lumbar spine showed septic arthritis of the right L1-2 facet joint with associated fluid collection. The patient went for I&D on 4/4 and we obtained a TEE that could not exclude endocarditis. She was successfully cardioverted at that time. Since has gone back for another I&D on 4/7, right BKA on 4/9, and left UE amputation on 4/11. She remains critical with a poor prognosis.  LINES / TUBES: Left peripheral IV  Right IJ tunneled HD catheter 3/31 Urinary catheter [place] rain right hand  Endotracheal tube 4/4 NG tube   CULTURES: - 4/4 blood cultures on 3/29 for MSSA - Urine cultures on 3/29 growing MSSA - 1/2 Blood cultures on 3/30 with MSSA - Right toe superficial wound culture with staph aureus  - Left wrist synovial fluid with gram positive cocci  - Blood Cultures 4/4 with 1/2 positive - Blood cultures 4/9 NGTD   ANTIBIOTICS: - Vancomycin 3/29 -> discontinued on 3/30  - Aztreonam 3/29 ->  discontinued on 3/30  - Flagyl 3/29 -> discontinued on 3/30  - Daptomycin 3/30 -> current  SIGNIFICANT EVENTS: Admitted for MSSA bacteremia 3/29 >>> Hypotensive and started on CRRT 3/31 >>> Converted to Atrial Fibrillation 4/1 >>> I&D of right wrist, right foot, and left wrist 4/4 >>> TEE with cardioversion 4/4 >>> Repeat blood cultures 1 of 2 positive on 4/4 >>> CRRT stopped 4/4 >>> Hypotension requiring pressors (neo) 4/4 >>> Left on Vent 4/4 >>> IR drainage of right L1-2 facet abscess 4/5 >>> CRRT restarted 4/5 >>> CRRT stopped 4/7 >>> I&D of right wrist, right foot, and left wrist. Amputation of right 1st ray and right second digit at DIP 4/7 >>> Hypotensive requiring pressor support with Neo 4/8 >>> Right BKA 4/9 >>> I&D with left UE amputation 4/11 >>>  INTERVAL HISTORY:  No major events overnight.  Febrile again. Remains on Neo.   Continue goals of care discussion with the family. Palliative care consulted.   VITAL SIGNS: Temp:  [98.5 F (36.9 C)-102 F (38.9 C)] 101.3 F (38.5 C) (04/12 0400) Pulse Rate:  [63-102] 86 (04/12 0600) Resp:  [0-24] 0 (04/12 0600) BP: (78-148)/(25-72) 136/57 (04/12 0600) SpO2:  [93 %-100 %] 97 % (04/12 0600) FiO2 (%):  [40 %] 40 % (04/12 0600) Weight:  [301 lb 13 oz (136.9 kg)] 301 lb 13 oz (136.9 kg) (04/12 0500)  INTAKE / OUTPUT: Intake/Output      04/11 0701 - 04/12 0700 04/12 0701 - 04/13 0700   I.V. (mL/kg) 1669 (12.2)    Blood 315    NG/GT 510  IV Piggyback 320    Total Intake(mL/kg) 2814 (20.6)    Urine (mL/kg/hr) 85 (0)    Other     Blood 100    Total Output 185    Net +2629           PHYSICAL EXAMINATION:  General: Obese female, sedated  Neuro: Sedated but opens eyes to stimuli, does not track  HEENT: Normocephalic, atraumatic, moist mucus membranes Cardiovascular: RRR, no murmurs, no rubs Lungs: Diminished bibasilar breath sounds, rhonchi  Abdomen: Active bowel sounds, soft, no tenderness to palpation   Musculoskeletal: Mild LLE edema  Skin: Warm and dry   LABS: Cbc Recent Labs  Lab 09/17/17 0540 10/06/2017 0515 09/19/17 0514  WBC 14.2* 16.7* 14.0*  HGB 8.8* 7.5* 7.9*  HCT 27.3* 23.1* 24.5*  PLT 160 213 197   Chemistry Recent Labs  Lab 09/17/17 0540 10/01/2017 0515 09/19/17 0514  NA 133* 132* 130*  K 5.1 3.7 4.2  CL 99* 97* 96*  CO2 23 26 24   BUN 36* 28* 50*  CREATININE 3.13* 2.51* 3.43*  CALCIUM 7.5* 7.4* 7.5*  MG 1.9 1.8 1.9  PHOS 6.6* 4.0 4.4  GLUCOSE 118* 112* 123*   Liver fxn Recent Labs  Lab 09/15/17 0408  09/17/17 0540 09/17/2017 0515 09/19/17 0514  AST 55*  --  52*  --  88*  ALT 22  --  24  --  26  ALKPHOS 519*  --  409*  --  416*  BILITOT 4.1*  --  5.0*  --  5.0*  PROT 5.7*  --  5.6*  --  5.4*  ALBUMIN 1.4*  1.5*   < > 1.3*  1.3* 1.2* 1.2*   < > = values in this interval not displayed.   coags No results for input(s): APTT, INR in the last 168 hours.   Sepsis markers No results for input(s): LATICACIDVEN, PROCALCITON in the last 168 hours.   Cardiac markers Recent Labs  Lab 09/15/17 0408 09/17/17 0540 09/19/17 0514  CKTOTAL 102 439* 1,911*   BNP No results for input(s): PROBNP in the last 168 hours.   ABG Recent Labs  Lab 10/07/2017 0607 09/15/17 0715 09/19/17 0521  HCO3 25.1 22.0 28.1*  TCO2 27 23 30    CBG trend Recent Labs  Lab 09/29/2017 1557 10/01/2017 1559 10/03/2017 1929 09/22/2017 2321 09/19/17 0425  GLUCAP 60* 90 83 111* 120*   DIAGNOSES: Principal Problem:   MSSA bacteremia Active Problems:   Thalassemia   Rheumatoid arthritis ([place])   Morbid obesity (H[facility])   Acute back pain   Essential hypertension   Diabetes ([place])   Dyslipidemia   Severe sepsis with septic shock ([place])   Acute kidney injury ([place])   Immunosuppression due to drug therapy   Rheumatoid arthritis(714.0)   Septic joint of left wrist ([place])   Osteomyelitis of great toe of right foot ([place])   Hypertension   Hyperlipidemia   Acute hearing loss of left  ear   Diabetes mellitus, insulin dependent (IDDM), controlled (HCC)   Beta thalassemia (HCC)   Acute anemia   Elevated liver enzymes   Hyperbilirubinemia   Altered mental status, unspecified   Atrial flutter (HCC)   Infection of right hand   Abscess   Acute renal failure (HCC)   Acute respiratory failure (HCC)   Encephalopathy   Endotracheally intubated   Staphylococcal arthritis of right wrist (HCC)   Facet joint disease   Pleural effusion   Left wrist pain   Goals of care, counseling/discussion  ASSESSMENT / PLAN: 57 y.o female with RA on immunosuppressive therapy (Levunomide and Abatacept) who presented to the ED on 3/29 with sepsis and multiorgan failure secondary to MSSA Bacteremia/Endocaridits.   Spoke with the patient's son, [place], at depth this AM. He understands the overall poor prognosis of his mother's situation. He is struggling to get his family on the same page and figure out what his mother would want. He is trying to make decisions based on their faith, which he holds in high regards and states that his mother did as well. He is very apprecitative of the aggressive care his mother has received at this point. I am trying to reach palliative care so that they can coordinate a family meeting prior to the OR tomorrow.   INFECTIOUS A: Sepsis secondary to MSSA Bacteremia/Endocarditis  MSSA septic arthritis, spinal abscess S/p right BKA on 4/9.  S/p left UE below the elbow amputation 4/11  P:   Appreciate ID's help, continuing daptomycin (CK up to 1911 likely from continued I&D and amputations, but need to monitor in setting of dapto use) Appreciate orthopedics help.  Currently on neo for pressure support. Continue to wean with goal MAP >65. Blood cultures from 4/9 with NGTD. Needs line holiday.   CARDIOVASCULAR A:  Hypotension Patient is hypertensive at baseline on bystolic, irbesartan, and spironolactone.  Atrial Fib 2/2 sepsis  Cardioverted on 4/4.  On  amiodarone   P:  Cardiology signed off 4/8.  Currently in sinus rhythm. Restarted heparin last night.  Currently on neo for pressure support. Continue to wean with goal MAP >65. Continue Amiodarone.  RENAL A: ARF secondary to MSSA bacteremia Hyponatremia  Creatine elevated from baseline of 0.9 to 3.36 on admission  UA with hematuria, proteinuria, and bacteruria. Cultures growing Staph aureus.  Mg 1.9 PO4 4.4 K 4.2  P:   Appreciate nephrology's help. HD per nephrology.  Patient remains oligouric. Volume overload causing minor hyponatremia and worsening pulmonary edema on CXR.  Maintain MAP >65 Needs line holiday.   PULMONARY A: Post surgical ventilation  Obstructive Sleep Apnea  Vent Mode: PRVC FiO2 (%):  [40 %] 40 % Set Rate:  [18 bmp-22 bmp] 22 bmp Vt Set:  [470 mL] 470 mL PEEP:  [5 cmH20] 5 cmH20 Plateau Pressure:  [23 cmH20-29 cmH20] 26 cmH20   Intubate on 4/4, day #8  P:   RASS: -3 Current sedation: Fentanyl  ABG showing respiratory acidosis today. Will increase minute ventilation.  CXR with appropriate positioned ET and increasing pulmonary edema. VAP precautions Will wean sedation and vent to work towards extubation.   GASTROINTESTINAL A: Elevated LFTs ? Cholestatic liver injury  Nutrition  GI ppx  P:   Will continue to monitor LFTs  Continue GI ppx  Continue tube feeds.  HEMATOLOGIC A:  Beta Thalassemia  Acute on chronic anemia secondary to sepsis  P:  Hgb 7.9 Transfusion goals: Transfuse 1 units pRBCs if <7 or <8 if patient becomes hemodynamically unstable and/or has active bleeding   ENDOCRINE A: Insulin Dependent Type 2 DM On Humolin 75 units TID at home   P:   CBG goal 140-180  Continue SSI-moderate q 4hours  NEUROLOGICAL A: Altered Mental Status  Multiple contributing factors including A-fib, sepsis, opiates CT head and ammonium WNL  P:  Wean sedation.   Pulmonary and [place] Pager:  [phone]  09/19/2017, 7:10 AM

## 2017-09-19 NOTE — Care Management Note (Signed)
Case Management Note  Patient Details  Name: Jessica Howard MRN: [id number] Date of Birth: 03-Jun-1961  Subjective/Objective:      Pt admitted with MSSA bactermia - suspicion of endocarditis           Action/Plan:  PTA from home.  Pt is on ventilator and CRRT.     Expected Discharge Date:                  Expected Discharge Plan:     In-House Referral:  Clinical Social Work  Discharge planning Services  CM Consult  Post Acute Care Choice:    Choice offered to:     DME Arranged:    DME Agency:     HH Arranged:    HH Agency:     Status of Service:     If discussed at [employer], dates discussed:    Additional Comments: 09/19/2017 Pt remains on ventilator, and will return to OR tomorrow.  Palliative consult Maryclare Labrador, RN 09/19/2017, 3:25 PM

## 2017-09-19 NOTE — Progress Notes (Signed)
ANTICOAGULATION CONSULT NOTE - Follow Up Consult  Pharmacy Consult for Heparin Indication: atrial fibrillation  Allergies  Allergen Reactions  . Bactrim [Sulfamethoxazole-Trimethoprim] Hives  . Cefuroxime Axetil Other (See Comments)    Blistering wounds per family  . Cephalosporins Other (See Comments)    Blistering wounds as an adult per family  . Lisinopril Cough  . Penicillins Hives  . Iohexol Itching and Rash     Code: RASH, Desc: HAD ITCHING AND A RASH ABOUT ONE HOUR AFTER RETURNING HOME FROM THE CT, Onset Date: [id number]   . Sulfa Antibiotics Hives    Patient Measurements: Height: 5\' 2"  (157.5 cm) Weight: 283 lb 8.2 oz (128.6 kg) IBW/kg (Calculated) : 50.1 Heparin Dosing Weight: 81 kg  Vital Signs: Temp: 102 F (38.9 C) (04/11 2341) Temp Source: Oral (04/11 2341) BP: 104/44 (04/12 0300) Pulse Rate: 84 (04/12 0330)  Labs: Recent Labs    09/27/2017 0500  10/06/2017 1731 09/17/17 0540 09/17/17 2056 09/30/2017 0515 09/19/17 0257  HGB  --    < > 7.5* 8.8*  --  7.5*  --   HCT  --   --  22.0* 27.3*  --  23.1*  --   PLT  --   --   --  160  --  213  --   HEPARINUNFRC  --   --   --   --  0.50 0.23* <0.10*  CREATININE 4.06*  --   --  3.13*  --  2.51*  --   CKTOTAL  --   --   --  439*  --   --   --    < > = values in this interval not displayed.    Estimated Creatinine Clearance: 32.6 mL/min (A) (by C-G formula based on SCr of 2.51 mg/dL (H)).   Assessment: 23 yoF with disseminated MSSA and rapid AFib now s/p DCCV on 4/4. Pt has been on heparin intermittently in between holding for surgeries. CBC has been low/stable. Heparin level is low after re-starting on 4/11 s/p OR.   Goal of Therapy:  Heparin level 0.3-0.7 units/ml Monitor platelets by anticoagulation protocol: Yes   Plan:  Inc heparin to 2200 units/hr 1200 HL  Narda Bonds, PharmD, BCPS Clinical Pharmacist Phone: [phone]

## 2017-09-19 NOTE — Progress Notes (Signed)
Nutrition Follow-up  DOCUMENTATION CODES:   Morbid obesity  INTERVENTION:   Start trickle TF:  Increase Vital High Protein to new goal rate of 60 ml/h   D/C Pro-stat  Provides 1440 kcal, 126 gm protein, 1204 ml free water daily.  NUTRITION DIAGNOSIS:   Inadequate oral intake related to inability to eat as evidenced by NPO status.  Ongoing  GOAL:   Provide needs based on ASPEN/SCCM guidelines  Met with TF  MONITOR:   Vent status, Skin, Labs, I & O's   ASSESSMENT:   57 yo female with PMH of HTN, HLD, hepatic steatosis, Rheumatoid arthritis (on immunosuppressive agents), sleep apnea, neuropathy, DM, asthma, thyroid disease, GERD, Thalassemia, and hearing loss who was admitted on 3/29 with MSSA bacteremia, septic arthritis in several joints, AKI due to ATN.  Discussed patient in ICU rounds and with RN today. S/P amputation of L hand and R BKA.  CRRT off. Nephrology following.   Patient is currently receiving Vital High Protein via NGT at 20 ml/h (480 ml/day) with Prostat 60 ml TID to provide 1080 kcals, 132 gm protein, 401 ml free water daily.   Patient remains intubated on ventilator support Temp (24hrs), Avg:100.2 F (37.9 C), Min:98.5 F (36.9 C), Max:102 F (38.9 C)  Propofol: off   Labs reviewed. Sodium 132 (L) CBG's: 105-108-116 Medications reviewed and include Colace and Lactulose.   Diet Order:  Diet NPO time specified  EDUCATION NEEDS:   No education needs have been identified at this time  Skin:  Skin Assessment: Skin Integrity Issues: Skin Integrity Issues:: Incisions Incisions: L forearm amputation, R wrist, R BKA, R arm  Last BM:  4/9 (type 6)  Height:   Ht Readings from Last 1 Encounters:  09/26/2017 5' 2" (1.575 m)    Weight:   Wt Readings from Last 1 Encounters:  09/19/17 (!) 301 lb 13 oz (136.9 kg)   09/15/17 286 lb 6 oz (129.9 kg)   09/12/17 281 lb 4.9 oz (127.6 kg)   Admission weight  260 lb (117.9 kg)--BMI 47.5  Ideal  Body Weight:  50 kg  BMI:  Body mass index is 55.2 kg/m.  Estimated Nutritional Needs:   Kcal:  1300-1650  Protein:  125 gm  Fluid:  >/= 1.5 L     , RD, LDN, CNSC Pager [phone] After Hours Pager [phone]  

## 2017-09-19 NOTE — Progress Notes (Signed)
This note also relates to the following rows which could not be included: SpO2 - Cannot attach notes to unvalidated device data  RT NOTE: RT attempted SBT per MD. Patient went apneic with no respiratory effort. Patient back on full support. Vitals are stable. RT will continue to monitor.

## 2017-09-20 ENCOUNTER — Inpatient Hospital Stay (HOSPITAL_COMMUNITY): Payer: Medicare Other

## 2017-09-20 ENCOUNTER — Inpatient Hospital Stay (HOSPITAL_COMMUNITY): Payer: Medicare Other | Admitting: Anesthesiology

## 2017-09-20 ENCOUNTER — Encounter (HOSPITAL_COMMUNITY): Admission: EM | Disposition: E | Payer: Self-pay | Source: Home / Self Care | Attending: Internal Medicine

## 2017-09-20 DIAGNOSIS — L0291 Cutaneous abscess, unspecified: Secondary | ICD-10-CM

## 2017-09-20 HISTORY — PX: I&D EXTREMITY: SHX5045

## 2017-09-20 HISTORY — PX: OLECRANON BURSECTOMY: SHX2097

## 2017-09-20 HISTORY — PX: INCISION AND DRAINAGE ABSCESS: SHX5864

## 2017-09-20 LAB — GLUCOSE, CAPILLARY
GLUCOSE-CAPILLARY: 120 mg/dL — AB (ref 65–99)
GLUCOSE-CAPILLARY: 94 mg/dL (ref 65–99)
GLUCOSE-CAPILLARY: 94 mg/dL (ref 65–99)
Glucose-Capillary: 86 mg/dL (ref 65–99)
Glucose-Capillary: 84 mg/dL (ref 65–99)
Glucose-Capillary: 96 mg/dL (ref 65–99)

## 2017-09-20 LAB — POCT I-STAT 3, ART BLOOD GAS (G3+)
Acid-base deficit: 1 mmol/L (ref 0.0–2.0)
Bicarbonate: 24.4 mmol/L (ref 20.0–28.0)
O2 SAT: 99 %
PCO2 ART: 48.2 mmHg — AB (ref 32.0–48.0)
PH ART: 7.32 — AB (ref 7.350–7.450)
TCO2: 26 mmol/L (ref 22–32)
pO2, Arterial: 145 mmHg — ABNORMAL HIGH (ref 83.0–108.0)

## 2017-09-20 LAB — HEPATIC FUNCTION PANEL
ALT: 26 U/L (ref 14–54)
AST: 105 U/L — AB (ref 15–41)
Alkaline Phosphatase: 402 U/L — ABNORMAL HIGH (ref 38–126)
BILIRUBIN TOTAL: 5.4 mg/dL — AB (ref 0.3–1.2)
Bilirubin, Direct: 3 mg/dL — ABNORMAL HIGH (ref 0.1–0.5)
Indirect Bilirubin: 2.4 mg/dL — ABNORMAL HIGH (ref 0.3–0.9)
Total Protein: 5.1 g/dL — ABNORMAL LOW (ref 6.5–8.1)

## 2017-09-20 LAB — RENAL FUNCTION PANEL
ANION GAP: 9 (ref 5–15)
ANION GAP: 10 (ref 5–15)
Albumin: <1 g/dL — ABNORMAL LOW (ref 3.5–5.0)
Albumin: 1.1 g/dL — ABNORMAL LOW (ref 3.5–5.0)
BUN: 66 mg/dL — ABNORMAL HIGH (ref 6–20)
BUN: 62 mg/dL — AB (ref 6–20)
CHLORIDE: 96 mmol/L — AB (ref 101–111)
CHLORIDE: 98 mmol/L — AB (ref 101–111)
CO2: 23 mmol/L (ref 22–32)
CO2: 21 mmol/L — ABNORMAL LOW (ref 22–32)
Calcium: 7.3 mg/dL — ABNORMAL LOW (ref 8.9–10.3)
Calcium: 7.3 mg/dL — ABNORMAL LOW (ref 8.9–10.3)
Creatinine, Ser: 4.17 mg/dL — ABNORMAL HIGH (ref 0.44–1.00)
Creatinine, Ser: 3.96 mg/dL — ABNORMAL HIGH (ref 0.44–1.00)
GFR calc non Af Amer: 11 mL/min — ABNORMAL LOW (ref >60)
GFR calc non Af Amer: 12 mL/min — ABNORMAL LOW (ref >60)
GFR, EST AFRICAN AMERICAN: 13 mL/min — AB (ref >60)
GFR, EST AFRICAN AMERICAN: 14 mL/min — AB (ref >60)
Glucose, Bld: 96 mg/dL (ref 65–99)
Glucose, Bld: 100 mg/dL — ABNORMAL HIGH (ref 65–99)
POTASSIUM: 5.7 mmol/L — AB (ref 3.5–5.1)
POTASSIUM: 3.7 mmol/L (ref 3.5–5.1)
Phosphorus: 3.9 mg/dL (ref 2.5–4.6)
Phosphorus: 4.1 mg/dL (ref 2.5–4.6)
Sodium: 128 mmol/L — ABNORMAL LOW (ref 135–145)
Sodium: 129 mmol/L — ABNORMAL LOW (ref 135–145)

## 2017-09-20 LAB — CBC
HCT: 21.3 % — ABNORMAL LOW (ref 36.0–46.0)
HEMOGLOBIN: 6.9 g/dL — AB (ref 12.0–15.0)
MCH: 26.6 pg (ref 26.0–34.0)
MCHC: 32.4 g/dL (ref 30.0–36.0)
MCV: 82.2 fL (ref 78.0–100.0)
Platelets: 215 10*3/uL (ref 150–400)
RBC: 2.59 MIL/uL — AB (ref 3.87–5.11)
RDW: 20.1 % — ABNORMAL HIGH (ref 11.5–15.5)
WBC: 10.8 10*3/uL — AB (ref 4.0–10.5)

## 2017-09-20 LAB — MAGNESIUM: Magnesium: 2 mg/dL (ref 1.7–2.4)

## 2017-09-20 LAB — PREPARE RBC (CROSSMATCH)

## 2017-09-20 LAB — HEPARIN LEVEL (UNFRACTIONATED): Heparin Unfractionated: 0.16 IU/mL — ABNORMAL LOW (ref 0.30–0.70)

## 2017-09-20 LAB — HEMOGLOBIN AND HEMATOCRIT, BLOOD
HCT: 27.2 % — ABNORMAL LOW (ref 36.0–46.0)
Hemoglobin: 8.9 g/dL — ABNORMAL LOW (ref 12.0–15.0)

## 2017-09-20 SURGERY — IRRIGATION AND DEBRIDEMENT EXTREMITY
Anesthesia: General | Laterality: Right

## 2017-09-20 MED ORDER — ROCURONIUM BROMIDE 10 MG/ML (PF) SYRINGE
PREFILLED_SYRINGE | INTRAVENOUS | Status: AC
  Filled 2017-09-20: qty 10

## 2017-09-20 MED ORDER — DEXMEDETOMIDINE HCL IN NACL 200 MCG/50ML IV SOLN
INTRAVENOUS | Status: AC
  Filled 2017-09-20: qty 50

## 2017-09-20 MED ORDER — PANTOPRAZOLE SODIUM 40 MG PO PACK
40.0000 mg | PACK | Freq: Every day | ORAL | Status: DC
  Administered 2017-09-21 – 2017-09-22 (×2): 40 mg
  Filled 2017-09-20 (×2): qty 20

## 2017-09-20 MED ORDER — PHENYLEPHRINE 40 MCG/ML (10ML) SYRINGE FOR IV PUSH (FOR BLOOD PRESSURE SUPPORT)
PREFILLED_SYRINGE | INTRAVENOUS | Status: AC
  Filled 2017-09-20: qty 20

## 2017-09-20 MED ORDER — MIDAZOLAM HCL 2 MG/2ML IJ SOLN
INTRAMUSCULAR | Status: AC
  Filled 2017-09-20: qty 2

## 2017-09-20 MED ORDER — PRISMASOL BGK 4/2.5 32-4-2.5 MEQ/L IV SOLN
INTRAVENOUS | Status: DC
  Administered 2017-09-20 – 2017-09-21 (×2): via INTRAVENOUS_CENTRAL
  Filled 2017-09-20 (×3): qty 5000

## 2017-09-20 MED ORDER — ROCURONIUM BROMIDE 10 MG/ML (PF) SYRINGE
PREFILLED_SYRINGE | INTRAVENOUS | Status: DC | PRN
  Administered 2017-09-20 (×2): 20 mg via INTRAVENOUS
  Administered 2017-09-20: 40 mg via INTRAVENOUS
  Administered 2017-09-20: 30 mg via INTRAVENOUS

## 2017-09-20 MED ORDER — SODIUM CHLORIDE 0.9 % IR SOLN
Status: DC | PRN
  Administered 2017-09-20: 1200 mL

## 2017-09-20 MED ORDER — FENTANYL CITRATE (PF) 250 MCG/5ML IJ SOLN
INTRAMUSCULAR | Status: AC
  Filled 2017-09-20: qty 5

## 2017-09-20 MED ORDER — SODIUM CHLORIDE 0.9 % IV SOLN
Freq: Once | INTRAVENOUS | Status: AC
  Administered 2017-09-20: 10:00:00 via INTRAVENOUS

## 2017-09-20 MED ORDER — PRISMASOL BGK 4/2.5 32-4-2.5 MEQ/L IV SOLN
INTRAVENOUS | Status: DC
  Administered 2017-09-20 – 2017-09-22 (×4): via INTRAVENOUS_CENTRAL
  Filled 2017-09-20 (×6): qty 5000

## 2017-09-20 MED ORDER — CIPROFLOXACIN IN D5W 400 MG/200ML IV SOLN
400.0000 mg | Freq: Two times a day (BID) | INTRAVENOUS | Status: DC
  Filled 2017-09-20: qty 200

## 2017-09-20 MED ORDER — CIPROFLOXACIN IN D5W 400 MG/200ML IV SOLN
400.0000 mg | Freq: Two times a day (BID) | INTRAVENOUS | Status: DC
  Administered 2017-09-20 – 2017-09-22 (×4): 400 mg via INTRAVENOUS
  Filled 2017-09-20 (×6): qty 200

## 2017-09-20 MED ORDER — PROPOFOL 10 MG/ML IV BOLUS
INTRAVENOUS | Status: AC
  Filled 2017-09-20: qty 20

## 2017-09-20 MED ORDER — SODIUM CHLORIDE 0.9 % FOR CRRT
INTRAVENOUS_CENTRAL | Status: DC | PRN
  Administered 2017-09-20: 15:00:00 via INTRAVENOUS_CENTRAL
  Filled 2017-09-20: qty 1000

## 2017-09-20 MED ORDER — HEPARIN (PORCINE) 2000 UNITS/L FOR CRRT
INTRAVENOUS_CENTRAL | Status: DC | PRN
  Administered 2017-09-20 – 2017-09-22 (×3): via INTRAVENOUS_CENTRAL

## 2017-09-20 MED ORDER — 0.9 % SODIUM CHLORIDE (POUR BTL) OPTIME
TOPICAL | Status: DC | PRN
  Administered 2017-09-20: 1000 mL

## 2017-09-20 MED ORDER — PHENYLEPHRINE HCL 10 MG/ML IJ SOLN
INTRAMUSCULAR | Status: DC | PRN
  Administered 2017-09-20 (×2): 80 ug via INTRAVENOUS

## 2017-09-20 MED ORDER — MIDAZOLAM HCL 2 MG/2ML IJ SOLN
INTRAMUSCULAR | Status: DC | PRN
  Administered 2017-09-20: 2 mg via INTRAVENOUS

## 2017-09-20 MED ORDER — HEPARIN SOD (PORK) LOCK FLUSH 100 UNIT/ML IV SOLN
INTRAVENOUS | Status: AC
  Filled 2017-09-20: qty 5

## 2017-09-20 MED ORDER — PRISMASOL BGK 4/2.5 32-4-2.5 MEQ/L IV SOLN
INTRAVENOUS | Status: DC
  Administered 2017-09-20 – 2017-09-22 (×16): via INTRAVENOUS_CENTRAL
  Filled 2017-09-20 (×26): qty 5000

## 2017-09-20 MED ORDER — HEPARIN (PORCINE) IN NACL 100-0.45 UNIT/ML-% IJ SOLN
2800.0000 [IU]/h | INTRAMUSCULAR | Status: DC
  Administered 2017-09-20 – 2017-09-21 (×2): 2800 [IU]/h via INTRAVENOUS
  Filled 2017-09-20 (×3): qty 250

## 2017-09-20 SURGICAL SUPPLY — 45 items
BANDAGE ACE 3X5.8 VEL STRL LF (GAUZE/BANDAGES/DRESSINGS) ×2 IMPLANT
BANDAGE ACE 4X5 VEL STRL LF (GAUZE/BANDAGES/DRESSINGS) ×7 IMPLANT
BANDAGE ACE 6X5 VEL STRL LF (GAUZE/BANDAGES/DRESSINGS) ×5 IMPLANT
BNDG COHESIVE 4X5 TAN STRL (GAUZE/BANDAGES/DRESSINGS) ×5 IMPLANT
BNDG CONFORM 2 STRL LF (GAUZE/BANDAGES/DRESSINGS) ×4 IMPLANT
BNDG GAUZE ELAST 4 BULKY (GAUZE/BANDAGES/DRESSINGS) ×20 IMPLANT
BOOTCOVER CLEANROOM LRG (PROTECTIVE WEAR) ×10 IMPLANT
COVER SURGICAL LIGHT HANDLE (MISCELLANEOUS) ×5 IMPLANT
CUFF TOURNIQUET SINGLE 34IN LL (TOURNIQUET CUFF) ×3 IMPLANT
DRSG ADAPTIC 3X8 NADH LF (GAUZE/BANDAGES/DRESSINGS) ×18 IMPLANT
DRSG PAD ABDOMINAL 8X10 ST (GAUZE/BANDAGES/DRESSINGS) ×15 IMPLANT
DURAPREP 26ML APPLICATOR (WOUND CARE) ×1 IMPLANT
ELECT REM PT RETURN 9FT ADLT (ELECTROSURGICAL)
ELECTRODE REM PT RTRN 9FT ADLT (ELECTROSURGICAL) IMPLANT
EVACUATOR 1/8 PVC DRAIN (DRAIN) IMPLANT
GAUZE SPONGE 4X4 12PLY STRL (GAUZE/BANDAGES/DRESSINGS) ×17 IMPLANT
GAUZE XEROFORM 1X8 LF (GAUZE/BANDAGES/DRESSINGS) ×5 IMPLANT
GLOVE BIOGEL PI ORTHO PRO SZ8 (GLOVE) ×2
GLOVE ORTHO TXT STRL SZ7.5 (GLOVE) ×11 IMPLANT
GLOVE PI ORTHO PRO STRL SZ8 (GLOVE) ×3 IMPLANT
GLOVE SURG ORTHO 8.0 STRL STRW (GLOVE) ×10 IMPLANT
GOWN STRL REUS W/ TWL LRG LVL3 (GOWN DISPOSABLE) IMPLANT
GOWN STRL REUS W/TWL LRG LVL3 (GOWN DISPOSABLE)
HANDPIECE INTERPULSE COAX TIP (DISPOSABLE)
KIT BASIN OR (CUSTOM PROCEDURE TRAY) ×5 IMPLANT
KIT TURNOVER KIT B (KITS) ×5 IMPLANT
MANIFOLD NEPTUNE II (INSTRUMENTS) ×5 IMPLANT
NS IRRIG 1000ML POUR BTL (IV SOLUTION) ×5 IMPLANT
PACK ORTHO EXTREMITY (CUSTOM PROCEDURE TRAY) ×5 IMPLANT
PAD ARMBOARD 7.5X6 YLW CONV (MISCELLANEOUS) ×10 IMPLANT
SET HNDPC FAN SPRY TIP SCT (DISPOSABLE) IMPLANT
SPONGE LAP 18X18 X RAY DECT (DISPOSABLE) ×11 IMPLANT
STOCKINETTE IMPERVIOUS 9X36 MD (GAUZE/BANDAGES/DRESSINGS) ×3 IMPLANT
STOCKINETTE TUBULAR SYNTH 4IN (CAST SUPPLIES) ×6 IMPLANT
SUT ETHILON 2 0 PSLX (SUTURE) ×6 IMPLANT
SUT ETHILON 3 0 PS 1 (SUTURE) IMPLANT
SUT PROLENE 3 0 CT 1 (SUTURE) ×3 IMPLANT
SWAB CULTURE ESWAB REG 1ML (MISCELLANEOUS) IMPLANT
TOWEL OR 17X24 6PK STRL BLUE (TOWEL DISPOSABLE) ×5 IMPLANT
TOWEL OR 17X26 10 PK STRL BLUE (TOWEL DISPOSABLE) ×5 IMPLANT
TUBE CONNECTING 12'X1/4 (SUCTIONS) ×2
TUBE CONNECTING 12X1/4 (SUCTIONS) ×7 IMPLANT
UNDERPAD 30X30 (UNDERPADS AND DIAPERS) ×13 IMPLANT
WATER STERILE IRR 1000ML POUR (IV SOLUTION) ×5 IMPLANT
YANKAUER SUCT BULB TIP NO VENT (SUCTIONS) ×7 IMPLANT

## 2017-09-20 NOTE — Progress Notes (Signed)
[place] for Heparin Indication: atrial fibrillation  Allergies  Allergen Reactions  . Bactrim [Sulfamethoxazole-Trimethoprim] Hives  . Cefuroxime Axetil Other (See Comments)    Blistering wounds per family  . Cephalosporins Other (See Comments)    Blistering wounds as an adult per family  . Lisinopril Cough  . Penicillins Hives  . Iohexol Itching and Rash     Code: RASH, Desc: HAD ITCHING AND A RASH ABOUT ONE HOUR AFTER RETURNING HOME FROM THE CT, Onset Date: [id number]   . Sulfa Antibiotics Hives    Patient Measurements: Height: 5\' 2"  (157.5 cm) Weight: 295 lb 13.7 oz (134.2 kg) IBW/kg (Calculated) : 50.1 Heparin Dosing Weight: 81 kg  Vital Signs: Temp: 99 F (37.2 C) (04/13 0337) Temp Source: Oral (04/13 0337) BP: 98/49 (04/13 0600) Pulse Rate: 77 (04/13 0600)  Labs: Recent Labs    09/12/2017 0515  09/19/17 0514 09/19/17 1222 09/19/17 2103 09/19/2017 0528  HGB 7.5*  --  7.9*  --   --  6.9*  HCT 23.1*  --  24.5*  --   --  21.3*  PLT 213  --  197  --   --  215  HEPARINUNFRC 0.23*   < >  --  0.16* <0.10* 0.16*  CREATININE 2.51*  --  3.43*  --   --   --   CKTOTAL  --   --  1,911*  --   --   --    < > = values in this interval not displayed.    Estimated Creatinine Clearance: 24.5 mL/min (A) (by C-G formula based on SCr of 3.43 mg/dL (H)).  [name]  Goal of Therapy:  Heparin level 0.3-0.7 units/ml Monitor platelets by anticoagulation protocol: Yes   Plan:  Increase Heparin  2800 units/hr F/U plan for OR today  Phillis Knack, PharmD, BCPS  09/19/2017 7:02 AM

## 2017-09-20 NOTE — Op Note (Signed)
08/29/2017 - 09/13/2017  12:32 PM  PATIENT:  Donell Beers    PRE-OPERATIVE DIAGNOSIS: Left posterior heel infection  POST-OPERATIVE DIAGNOSIS: Left posterior heel infection tracking to the deep space of the plantar aspect of the foot extending up into the anterior and posterior compartments of the leg.  Likely calcaneal osteomyelitis.  PROCEDURE: Incision, irrigation, debridement, skin, subcutaneous tissue, and bone, with fasciotomy of the deep posterior compartment and superficial to posterior compartment as well as the anterior compartment of the left leg.  Dressing change right below-knee amputation stump.  SURGEON:  Johnny Bridge, MD   Meridee Score also provided curbside consultation evaluating the wound with me posteriorly, confirming the depth of extension of infection and involvement of the calcaneus.  PHYSICIAN ASSISTANT: Joya Gaskins, OPA-C, present and scrubbed throughout the case, critical for completion in a timely fashion, and for retraction, instrumentation, and closure.  ANESTHESIA:   General  PREOPERATIVE INDICATIONS:  PAISYN GUERCIO is a  57 y.o. female with multi-extremity infection that we have been treating surgically secondary to systemic sepsis, likely endocarditis.  The risks benefits and alternatives were discussed with the patient preoperatively including but not limited to the risks of infection, bleeding, nerve injury, cardiopulmonary complications, the need for revision surgery, among others, and the patient was willing to proceed.  ESTIMATED BLOOD LOSS: 122mL   OPERATIVE IMPLANTS: none  OPERATIVE FINDINGS: The right lower extremity below-knee amputation stump appeared clean without evidence of purulence or persistent infection.  The left lower extremity Achilles bursa had significant purulence that extended and tracked both distally and proximally into the deep tissue, and likely down into the calcaneus.   OPERATIVE PROCEDURE: The patient was  brought to the operating room and placed in supine position.  General anesthesia was administered.  The right lower extremity was examined, and the dressings were changed, and a light compressive wrap over the stump was applied followed by sterile dressings, and an Ace wrap.  The left lower extremity was then examined, Dr. Sharol Given entered the room and assisted with the examination, it appeared that the infection was quite deep, and would likely ultimately require a below-knee amputation.  I left the room at this point and went and spoke with the family offering them the opportunity to go ahead and proceed with a below-knee amputation versus irrigation and debridement, and they elected not to proceed with further amputation, but rather irrigation and debridement.  Left lower extremity was prepped and draped in usual sterile fashion.  Timeout performed.  I extended the posterior incision proximally and turned the corner and going distally along the posterior medial heel and into the proximal aspect of the midfoot.  Skin flap was elevated.  Care was taken to protect the neurovascular bundle.  I encountered substantial purulence, at least 20 mL of pus both proximally, distally, I perform fasciotomies of the posterior musculature, as well as tracking around anteriorly over the tibia, and got pus from both sides and also pus from the distal aspect.  During the dissection, I unfortunately caught my finger on the sharp portion of a skin rake, and penetrated both gloves.  The patient had blood sent for evaluation of potentially transmittable infectious disease per protocol.  I cleaned my hand, changed gloves, and continued at the operation, and then completed the elevation of the flap, and exposed all of the purulent tissue and debrided with a knife, rongure, lap sponge, curette.  I then irrigated a total of 9 L of fluid through the infected  wound, dressed the wound open with sterile gauze, a light compressive wrap,  bulky dressing.  Dr. Iran Planas was operating simultaneously on both of her upper extremities.  Please see his portion of the dictated notes for additional operative documentation.

## 2017-09-20 NOTE — Op Note (Signed)
PREOPERATIVE DIAGNOSIS:left elbow olecranon septic bursitis Right elbow olecranon septic bursitis Right thumb infection involving the webspace POSTOPERATIVE DIAGNOSIS:same  ATTENDING SURGEON: Dr Iran Planas who was scrubbed and present for the entire procedure  ASSISTANT SURGEON:none  ANESTHESIA:Gen. Via endotracheal tube  OPERATIVE PROCEDURE:#1: Left elbow olecranon bursectomy #2: Right elbow olecranon bursectomy #3: Right hand irrigation and debridement excisional debridement of skin subcutaneous tissue and irrigation of the flexor sheath right thumb  IMPLANTS:none  RADIOGRAPHIC INTERPRETATION:none  SURGICAL INDICATIONS:the patient is a right-hand-dominant female with a multi-extremity infection that has been treated on multiple occasions with repeated irrigations and debridements. I was asked to evaluate the patient's elbows and hand during this intervention to help assist in the multi-extremity infections.  Surgical procedure: Patient is brought straight from the intensive care unit to the operating room and placed in the supine position.: The left upper extremity was then prepped and draped in normal sterile fashion. The patient's amputation stump through the amputation of the mid forearm look great. The previous incisions were then removed from the olecranon bursa. Excisional debridement of the skin and subcutaneous tissue all the way down to the olecranon bursa was then carried out extensive bursectomy was then carried out circumferentially taking the devitalized tissue all the way down to the triceps extensor mechanism. After aggressive bursectomy the wound edges looked good the wound was thoroughly irrigated. The skin was then closed using simple nylon suture.  Attention was then turned to the right upper extremity. Right upper extremities and prepped and draped in normal sterile fashion. Attention was then turned the right thumb. Previous incision was then opened up and extended  within the webspace. There did not appear to be any tracking purulence going into the webspace. Skin edges were then excised with sharply with knife and sharp scissors. Careful protection of the flexor sheath was then done. Irrigation of the flexor sheath region was then carried out. The wound was thoroughly irrigated. After excisional debridement flexor sheath drainage the wound was then loosely reapproximated and closed over the flexor sheath to maintain some soft tissue coverage over the flexor sheath with Prolene suture.  Attention was then turned to the elbow. A curvilinear incision was then extended proximally and distally dissection carried down through the skin and subcutaneous tissue. The olecranon bursa was an circumferentially dissected free and bursectomy was then carried out of the right elbow. Patient did not have a large amount of purulence in the bursa on the right side it was not nearly as involved as the left side. The wound was then thoroughly irrigated. After aggressive bursectomy skin edges were then closed using nylon sutures.  Adaptic dressings were applied. Sterile compressive bandage then applied. Patient tolerated procedure well return the Intensive care unit in stable condition.  Postprocedure plan: Patient has a significant multi-extremity infections. At the current time the wounds look good on the left side and would not recommend repeat I&D at the current time in the next few days. She is going to need adequate surgical nutrition to help maintain her protein and albumin levels. She is going to need dressing changes on the right upper extremity daily Adaptic and dry dressing changes to the wounds and allow them 48-72 hours to reexamine and potential repeat I&D but don't think that the elbow will need possibly the thumb.   POSTOPERATIVE PLAN:

## 2017-09-20 NOTE — Progress Notes (Signed)
PULMONARY  / CRITICAL CARE MEDICINE  Name: Jessica Howard MRN: [id number] DOB: February 03, 1961    LOS: 19  REFERRING MD :  Velvet Bathe MD  CHIEF COMPLAINT:  Back Pain   BRIEF PATIENT DESCRIPTION: Patient is a 57 y.o female with RA on immunosuppressive therapy (Levunomide and Abatacept) and DM who presented to the ED with 2 weeks of progressive myalgias and arthralgias. Initial work-up was significant for AKI, HAGMA, elevated liver enzymes, lactic acidosis, leukocytosis, and acute on chronic anemia. Blood cultures subsequently grew MSSA (4/4). Initial TTE was negative for features of endocarditis; however, patient has pathologic PE findings including splinter hemorrhages and Olser nodes. Her renal function worsened over the course of her hospitalization and she is requiring CRRT. The patient also converted to atrial fibrillation and was placed on amiodarone, diltiazem, and lopressor. MRI of the right foot illustrated septic arthritis of the right first IP with associated osteomyelitis. MRI of the lumbar spine showed septic arthritis of the right L1-2 facet joint with associated fluid collection. The patient went for I&D on 4/4 and we obtained a TEE that could not exclude endocarditis. She was successfully cardioverted at that time. Since has gone back for another I&D on 4/7, right BKA on 4/9, and left UE amputation on 4/11. She remains critical with a poor prognosis.  LINES / TUBES: Left peripheral IV  Right IJ tunneled HD catheter 3/31 Urinary catheter [place] rain right hand  Endotracheal tube 4/4 NG tube   CULTURES: - 4/4 blood cultures on 3/29 for MSSA - Urine cultures on 3/29 growing MSSA - 1/2 Blood cultures on 3/30 with MSSA - Right toe superficial wound culture with staph aureus  - Left wrist synovial fluid with gram positive cocci  - Blood Cultures 4/4 with 1/2 positive - Blood cultures 4/9 NGTD   ANTIBIOTICS: - Vancomycin 3/29 -> discontinued on 3/30  - Aztreonam 3/29 ->  discontinued on 3/30  - Flagyl 3/29 -> discontinued on 3/30  - Daptomycin 3/30 -> current  SIGNIFICANT EVENTS: Admitted for MSSA bacteremia 3/29 >>> Hypotensive and started on CRRT 3/31 >>> Converted to Atrial Fibrillation 4/1 >>> I&D of right wrist, right foot, and left wrist 4/4 >>> TEE with cardioversion 4/4 >>> Repeat blood cultures 1 of 2 positive on 4/4 >>> CRRT stopped 4/4 >>> Hypotension requiring pressors (neo) 4/4 >>> Left on Vent 4/4 >>> IR drainage of right L1-2 facet abscess 4/5 >>> CRRT restarted 4/5 >>> CRRT stopped 4/7 >>> I&D of right wrist, right foot, and left wrist. Amputation of right 1st ray and right second digit at DIP 4/7 >>> Hypotensive requiring pressor support with Neo 4/8 >>> Right BKA 4/9 >>> I&D with left UE amputation 4/11 >>>  4/12 - No major events overnight. Febrile again. Remains on Neo.  Continue goals of care discussion with the family. Palliative care consulted.     SUBJECTIVE/OVERNIGHT/INTERVAL HX 4/13 - last HD 4/10; renal considering CRRT 10/01/2017 . Running fevers . On neo. S/p debriddement 4/11 with left lowr arm, left ankle, right elbow, right hand/thubm an amputation hand. Per ID aspiration right and left eblo with GPC on 4/11. On cipro and linezolid since 4/12. First palliative care meeting - full code but son beginning to consider care limitation such as no further amputation Ortho concerned 09/19/17 - infection continues to spread - plan for more wash out 09/29/2017. Son at bedside - asking about how comfor tcare will look like and if all measures have been taken to help mom the patient  VITAL SIGNS: Temp:  [99 F (37.2 C)-101.7 F (38.7 C)] 99.9 F (37.7 C) (04/13 0718) Pulse Rate:  [73-90] 77 (04/13 0700) Resp:  [0-28] 22 (04/13 0700) BP: (94-157)/(44-69) 94/48 (04/13 0700) SpO2:  [100 %] 100 % (04/13 0700) FiO2 (%):  [40 %] 40 % (04/13 0400) Weight:  [134.2 kg (295 lb 13.7 oz)] 134.2 kg (295 lb 13.7 oz) (04/13 0500)  INTAKE /  OUTPUT: Intake/Output      04/12 0701 - 04/13 0700 04/13 0701 - 04/14 0700   P.O. 0    I.V. (mL/kg) 1495.1 (11.1)    Blood     NG/GT 870    IV Piggyback 200    Total Intake(mL/kg) 2565.1 (19.1)    Urine (mL/kg/hr) 60 (0)    Blood     Total Output 60    Net +2505.1           PHYSICAL EXAMINATION:   General Appearance:    Looks criticall ill OBESE - yes  Head:    Normocephalic, without obvious abnormality, atraumatic  Eyes:    PERRL - yes, conjunctiva/corneas - clear      Ears:    Normal external ear canals, both ears  Nose:   NG tube - no  Throat:  ETT TUBE - yes , OG tube - yes  Neck:   Supple,  No enlargement/tenderness/nodules     Lungs:     Clear to auscultation bilaterally, Ventilator   Synchrony - yes on 40%  Chest wall:    No deformity  Heart:    S1 and S2 normal, no murmur, CVP - no.  Pressors - yes on eno  Abdomen:     Soft, no masses, no organomegaly  Genitalia:    Not done  Rectal:   not done  Extremities:   Extremities- amputation,      Skin:   Intact in exposed areas .     Neurologic:   Sedation -  fent gtt -> RASS - -3 . Moves all 4s - na. CAM-ICU - na . Orientation - na    PULMONARY Recent Labs  Lab 09/17/2017 0607 09/15/17 0715 09/19/17 0521 09/17/2017 0245  PHART  --   --   --  7.320*  PCO2ART  --   --   --  48.2*  PO2ART  --   --   --  145.0*  HCO3 25.1 22.0 28.1* 24.4  TCO2 27 23 30 26   O2SAT 61.0 63.0 50.0 99.0    CBC Recent Labs  Lab 09/19/2017 0515 09/19/17 0514 09/19/2017 0528  HGB 7.5* 7.9* 6.9*  HCT 23.1* 24.5* 21.3*  WBC 16.7* 14.0* 10.8*  PLT 213 197 215    COAGULATION No results for input(s): INR in the last 168 hours.  CARDIAC  No results for input(s): TROPONINI in the last 168 hours. No results for input(s): PROBNP in the last 168 hours.   CHEMISTRY Recent Labs  Lab 09/28/2017 0335 10/03/2017 0500 09/29/2017 1731 09/17/17 0540 09/28/2017 0515 09/19/17 0514 10/07/2017 0528  NA  --  131* 137 133* 132* 130* 128*  K  --  5.7*  4.7 5.1 3.7 4.2 5.7*  CL  --  101  --  99* 97* 96* 96*  CO2  --  19*  --  23 26 24 23   GLUCOSE  --  112* 89 118* 112* 123* 96  BUN  --  42*  --  36* 28* 50* 66*  CREATININE  --  4.06*  --  3.13* 2.51* 3.43* 4.17*  CALCIUM  --  7.5*  --  7.5* 7.4* 7.5* 7.3*  MG 2.3  --   --  1.9 1.8 1.9 2.0  PHOS  --  7.0*  --  6.6* 4.0 4.4 3.9   Estimated Creatinine Clearance: 20.1 mL/min (A) (by C-G formula based on SCr of 4.17 mg/dL (H)).   LIVER Recent Labs  Lab 09/15/17 0408 10/02/2017 0500 09/17/17 0540 09/15/2017 0515 09/19/17 0514 09/22/2017 0528  AST 55*  --  52*  --  88* 105*  ALT 22  --  24  --  26 26  ALKPHOS 519*  --  409*  --  416* 402*  BILITOT 4.1*  --  5.0*  --  5.0* 5.4*  PROT 5.7*  --  5.6*  --  5.4* 5.1*  ALBUMIN 1.4*  1.5* 1.4* 1.3*  1.3* 1.2* 1.2* <1.0*  <1.0*     INFECTIOUS No results for input(s): LATICACIDVEN, PROCALCITON in the last 168 hours.   ENDOCRINE CBG (last 3)  Recent Labs    09/24/2017 0336 10/07/2017 0719 09/24/2017 0803  GLUCAP 120* 86 94         IMAGING x48h  - image(s) personally visualized  -   highlighted in bold Dg Chest Port 1 View  Result Date: 10/02/2017 CLINICAL DATA:  Intubation. EXAM: PORTABLE CHEST 1 VIEW COMPARISON:  Chest x-rays dated 09/19/2017 and 09/17/2017. FINDINGS: Endotracheal tube appears well positioned with tip approximately 2.8 cm above the level of the carina. Enteric tube passes below the diaphragm. RIGHT IJ central line is well position with tip at the level of the lower SVC/cavoatrial junction. An additional line overlies the upper mediastinum and LEFT bronchus, suspected to be extraneous to the patient. There is persistent central pulmonary vascular congestion and mild interstitial edema. Vague opacities are again seen at each lung base, likely layering pleural effusions. No pneumothorax seen. IMPRESSION: 1. Endotracheal tube, enteric tube and RIGHT IJ central line remain appropriately positioned, as detailed above. 2.  Additional wire/line overlying the upper mediastinum and LEFT lower chest which is presumed to be extraneous to the patient. Patient's nurse [name] confirms that there is only 1 enteric tube. 3. Continued central pulmonary vascular congestion and mild bilateral interstitial edema indicating mild volume overload. 4. Veiled opacities at the bilateral lung bases, compatible with small layering pleural effusions and/or atelectasis. Electronically Signed   By: Franki Cabot M.D.   On: 10/06/2017 07:29   Dg Chest Port 1 View  Result Date: 09/19/2017 CLINICAL DATA:  Status post intubation EXAM: PORTABLE CHEST 1 VIEW COMPARISON:  09/17/2017 FINDINGS: Endotracheal tube, nasogastric catheter and right jugular central line are again noted and stable. Cardiac shadow is enlarged but stable. Right pleural effusion with basilar at opacities are seen. Similar changes are noted on the left. Stable vascular congestion is noted. IMPRESSION: Stable vascular congestion with bibasilar opacities and effusions. Tubes and lines as described. Electronically Signed   By: Inez Catalina M.D.   On: 09/19/2017 07:32      DIAGNOSES: Principal Problem:   MSSA bacteremia Active Problems:   Thalassemia   Rheumatoid arthritis ([place])   Morbid obesity ([place])   Acute back pain   Essential hypertension   Diabetes ([place])   Dyslipidemia   Severe sepsis with septic shock ([place])   AKI (acute kidney injury) ([place])   Immunosuppression due to drug therapy   Rheumatoid arthritis(714.0)   Septic joint of left wrist (H[facility])   Osteomyelitis of great toe of right foot (  [place])   Hypertension   Hyperlipidemia   Acute hearing loss of left ear   Diabetes mellitus, insulin dependent (IDDM), controlled (HCC)   Beta thalassemia (HCC)   Acute anemia   Elevated liver enzymes   Hyperbilirubinemia   Altered mental status, unspecified   Atrial flutter (HCC)   Infection of right hand   Abscess   Acute renal failure (HCC)   Acute respiratory failure  (HCC)   Encephalopathy   Endotracheally intubated   Staphylococcal arthritis of right wrist (HCC)   Facet joint disease   Pleural effusion   Left wrist pain   Goals of care, counseling/discussion   Palliative care by specialist   History of amputation of right lower extremity through tibia and fibula (HCC)   Septic olecranon bursitis of left elbow   Septic olecranon bursitis of right elbow  ASSESSMENT / PLAN: 57 y.o female with RA on immunosuppressive therapy (Levunomide and Abatacept) who presented to the ED on 3/29 with sepsis and multiorgan failure secondary to MSSA Bacteremia/Endocaridits.   Spoke with the patient's son, [place], at depth this AM. He understands the overall poor prognosis of his mother's situation. He is struggling to get his family on the same page and figure out what his mother would want. He is trying to make decisions based on their faith, which he holds in high regards and states that his mother did as well. He is very apprecitative of the aggressive care his mother has received at this point. I am trying to reach palliative care so that they can coordinate a family meeting prior to the OR tomorrow.   INFECTIOUS A: Sepsis secondary to MSSA Bacteremia/Endocarditis  MSSA septic arthritis, spinal abscess S/p right BKA on 4/9.  S/p left UE below the elbow amputation 4/11 Blood cultures from 4/9 with NGTD.  P:   Per ID and and ortho Needs line holiday but not possible   CARDIOVASCULAR A:  Patient is hypertensive at baseline on bystolic, irbesartan, and spironolactone.  Atrial Fib 2/2 sepsis  Cardioverted on 4/4.  On amiodarone  With HR 77  Circulatory shock ongoing .4/13  P:  Cardiology signed off 4/8.  Currently in sinus rhythm. Restarted heparin last night.  Currently on neo for pressure support. Continue to wean with goal MAP >65. Continue Amiodarone.  RENAL A: ARF secondary to MSSA bacteremia   - 4/13 - anuric. Renal planning CRRT.  Hyperkalemic  P:   Kayexalate CRRT per renal   PULMONARY A: Post surgical ventilation  Obstructive Sleep Apnea  Vent Mode: PRVC FiO2 (%):  [40 %] 40 % Set Rate:  [22 bmp] 22 bmp Vt Set:  [470 mL] 470 mL PEEP:  [5 cmH20] 5 cmH20 Plateau Pressure:  [21 cmH20-27 cmH20] 24 cmH20   Intubate on 4/4, day #9. Does not meet extubation criteria  P:   Full mech ventilatory support  GASTROINTESTINAL A: Elevated LFTs ? Cholestatic liver injury  Nutrition  GI ppx  P:   Will continue to monitor LFTs  Continue GI ppx  Continue tube feeds (on hold for OR 10/02/2017)  HEMATOLOGIC A:  Beta Thalassemia  Acute on chronic anemia secondary to sepsis   Hgb  < 7 on 10/07/2017  P:  - PRBC for hgb </= 6.9gm%    - exceptions are   -  if ACS susepcted/confirmed then transfuse for hgb </= 8.0gm%,  or    -  active bleeding with hemodynamic instability, then transfuse regardless of hemoglobin value   At at all  times try to transfuse 1 unit prbc as possible with exception of active hemorrhage    ENDOCRINE A: Insulin Dependent Type 2 DM On Humolin 75 units TID at home   P:   CBG goal 140-180  Continue SSI-moderate q 4hours  NEUROLOGICAL A: Altered Mental Status  Multiple contributing factors including A-fib, sepsis, opiates CT head and ammonium WNL  4/13 - no change  P:  RASS goalk -2/-3 due to pain from surgery       The patient is critically ill with multiple organ systems failure and requires high complexity decision making for assessment and support, frequent evaluation and titration of therapies, application of advanced monitoring technologies and extensive interpretation of multiple databases.   Critical Care Time devoted to patient care services described in this note is  30  Minutes. This time reflects time of care of this signee Dr Brand Males. This critical care time does not reflect procedure time, or teaching time or supervisory time of PA/NP/Med student/Med  Resident etc but could involve care discussion time    Dr. Brand Males, M.D., [facility].C.P Pulmonary and Critical Care Medicine Staff Physician [facility] Pager: [phone], If no answer or between  15:00h - 7:00h: call [phone]  09/19/2017 8:31 AM

## 2017-09-20 NOTE — Progress Notes (Signed)
ANTICOAGULATION CONSULT NOTE - Follow Up Consult  Pharmacy Consult for Heparin Indication: atrial fibrillation  Allergies  Allergen Reactions  . Bactrim [Sulfamethoxazole-Trimethoprim] Hives  . Cefuroxime Axetil Other (See Comments)    Blistering wounds per family  . Cephalosporins Other (See Comments)    Blistering wounds as an adult per family  . Lisinopril Cough  . Penicillins Hives  . Iohexol Itching and Rash     Code: RASH, Desc: HAD ITCHING AND A RASH ABOUT ONE HOUR AFTER RETURNING HOME FROM THE CT, Onset Date: [id number]   . Sulfa Antibiotics Hives    Patient Measurements: Height: 5\' 2"  (157.5 cm) Weight: 295 lb 13.7 oz (134.2 kg) IBW/kg (Calculated) : 50.1 Heparin Dosing Weight: 81 kg  Vital Signs: Temp: 98.9 F (37.2 C) (04/13 1558) Temp Source: Oral (04/13 1558) BP: 110/54 (04/13 1030) Pulse Rate: 82 (04/13 1030)  Labs: Recent Labs    09/12/2017 0515  09/19/17 0514 09/19/17 1222 09/19/17 2103 10/02/2017 0528  HGB 7.5*  --  7.9*  --   --  6.9*  HCT 23.1*  --  24.5*  --   --  21.3*  PLT 213  --  197  --   --  215  HEPARINUNFRC 0.23*   < >  --  0.16* <0.10* 0.16*  CREATININE 2.51*  --  3.43*  --   --  4.17*  CKTOTAL  --   --  1,911*  --   --   --    < > = values in this interval not displayed.    Estimated Creatinine Clearance: 20.1 mL/min (A) (by C-G formula based on SCr of 4.17 mg/dL (H)).   Assessment: 6 yoF with disseminated MSSA and rapid AFib now s/p DCCV on 4/4. Pt has been on heparin intermittently in between holding for surgeries, resumed 4/11 afternoon. Heparin held again this AM around 1000 for I and D today of left elbow, right elbow, left heel and irrigation of right hand. Patient is now back from OR and is having significant oozing from her elbow per RN. HgB this AM was also down to 6.9 and patient has required 2 units PRBC.    Goal of Therapy:  Heparin level 0.3-0.7 units/ml Monitor platelets by anticoagulation protocol: Yes   Plan:  -Hold  heparin s/p I and D due to oozing at elbows -F/U bleeding in AM to reassess re-start heparin  Jimmy Footman, PharmD, BCPS PGY2 Infectious Diseases Pharmacy Resident Pager: [phone]  10/01/2017

## 2017-09-20 NOTE — Progress Notes (Signed)
Plan for repeat I&D and dressing changes, bilateral upper extremities and bilateral lower extremities.  Hemoglobin value of 6.9 noted, I do not expect substantial blood loss from the surgery today, however certainly we will have to support her potentially with additional packed red blood cells.  Risks benefits and alternatives of discussed with the family, we will plan to proceed accordingly.  Johnny Bridge, MD

## 2017-09-20 NOTE — Progress Notes (Signed)
Patient ID: Jessica Howard, female   DOB: 01-21-1961, 57 y.o.   MRN: [id number] [facility] Progress Note   Assessment/ Plan:    1. Acute kidney injury: Likely from ATN associated with sepsis, remains anuric and without any evident renal recovery.  Worsening hyperkalemia-restart CRRT. 2. MSSA septicemia with diffuseseeding of joints/spine: S/P right BKA and left hand at forearm level amputation for osteomyelitis/joint+ fascial space infection and with plans for follow-up irrigation and debridement later today.  Remains on daptomycin and ciprofloxacin-appreciate input from infectious diseases. 3. Atrial fibrillation currently sinus rhythm and rate controlled on amiodarone s/p DCCV. 4. Abnormal LFT's-likely shock liver associated with bacteremia/sepsis.  With severe hypoalbuminemia secondary to acute illness/infection with downtrending transaminases. 5. Anemia of acute illness- low hemoglobin this morning with anticipated surgical losses, agree with PRBC transfusion 6. Hyponatremia-secondary to acute kidney injury/free water excretion defect in this anuric patient, continue to monitor after starting CRRT 7. Rheumatoid arthritis    Subjective:   Remains on pressors, plans for irrigation and debridement in OR today.   Objective:   BP (!) 98/49   Pulse 77   Temp 99.9 F (37.7 C) (Oral)   Resp (!) 22   Ht _0  (1.575 m)   Wt 134.2 kg (295 lb 13.7 oz)   LMP 12/27/2013 Comment: irreggular  SpO2 100%   BMI 54.11 kg/m   Intake/Output Summary (Last 24 hours) at 09/28/2017 0734 Last data filed at 10/05/2017 0354 Gross per 24 hour  Intake 2565.06 ml  Output 60 ml  Net 2505.06 ml   Weight change: -2.7 kg (-5 lb 15.2 oz)  Physical Exam: Gen: Intubated, appears comfortable CVS: Pulse irregular rhythm, normal rate, S1 and S2 normal Resp: Mechanical breath sounds bilaterally, no distinct rales or rhonchi Abd: Soft, obese, nontender Ext: Right arm in dressing, s/p R BKA,  status post left hand amputation at forearm level, right hand index fingert worsening hyperkalemia ip amputated  Imaging: Dg Chest Port 1 View  Result Date: 10/03/2017 CLINICAL DATA:  Intubation. EXAM: PORTABLE CHEST 1 VIEW COMPARISON:  Chest x-rays dated 09/19/2017 and 09/17/2017. FINDINGS: Endotracheal tube appears well positioned with tip approximately 2.8 cm above the level of the carina. Enteric tube passes below the diaphragm. RIGHT IJ central line is well position with tip at the level of the lower SVC/cavoatrial junction. An additional line overlies the upper mediastinum and LEFT bronchus, suspected to be extraneous to the patient. There is persistent central pulmonary vascular congestion and mild interstitial edema. Vague opacities are again seen at each lung base, likely layering pleural effusions. No pneumothorax seen. IMPRESSION: 1. Endotracheal tube, enteric tube and RIGHT IJ central line remain appropriately positioned, as detailed above. 2. Additional wire/line overlying the upper mediastinum and LEFT lower chest which is presumed to be extraneous to the patient. Patient's nurse [name] confirms that there is only 1 enteric tube. 3. Continued central pulmonary vascular congestion and mild bilateral interstitial edema indicating mild volume overload. 4. Veiled opacities at the bilateral lung bases, compatible with small layering pleural effusions and/or atelectasis. Electronically Signed   By: Franki Cabot M.D.   On: 09/08/2017 07:29   Dg Chest Port 1 View  Result Date: 09/19/2017 CLINICAL DATA:  Status post intubation EXAM: PORTABLE CHEST 1 VIEW COMPARISON:  09/17/2017 FINDINGS: Endotracheal tube, nasogastric catheter and right jugular central line are again noted and stable. Cardiac shadow is enlarged but stable. Right pleural effusion with basilar at opacities are seen. Similar changes are noted on the left.  Stable vascular congestion is noted. IMPRESSION: Stable vascular congestion with  bibasilar opacities and effusions. Tubes and lines as described. Electronically Signed   By: Inez Catalina M.D.   On: 09/19/2017 07:32    Labs: BMET Recent Labs  Lab 09/17/2017 0313 09/15/17 0408 09/15/17 1452 10/06/2017 0500 09/15/2017 1731 09/17/17 0540 09/28/2017 0515 09/19/17 0514 09/25/2017 0528  NA 133* 132* 132* 131* 137 133* 132* 130* 128*  K 4.1 4.5 4.7 5.7* 4.7 5.1 3.7 4.2 5.7*  CL 99* 100* 99* 101  --  99* 97* 96* 96*  CO2 23 18* 21* 19*  --  _0 GLUCOSE 130* 117* 108* 112* 89 118* 112* 123* 96  BUN 18 29* 32* 42*  --  36* 28* 50* 66*  CREATININE 1.65* 2.74* 3.14* 4.06*  --  3.13* 2.51* 3.43* 4.17*  CALCIUM 7.8* 7.5* 7.7* 7.5*  --  7.5* 7.4* 7.5* 7.3*  PHOS 3.4  3.4 3.7  --  7.0*  --  6.6* 4.0 4.4 3.9   CBC Recent Labs  Lab 09/17/17 0540 09/12/2017 0515 09/19/17 0514 09/09/2017 0528  WBC 14.2* 16.7* 14.0* 10.8*  HGB 8.8* 7.5* 7.9* 6.9*  HCT 27.3* 23.1* 24.5* 21.3*  MCV 79.8 81.9 83.9 82.2  PLT 160 213 197 215    Medications:    . amiodarone  200 mg Oral Daily  . chlorhexidine gluconate (MEDLINE KIT)  15 mL Mouth Rinse BID  . Chlorhexidine Gluconate Cloth  6 each Topical Daily  . docusate  100 mg Per Tube BID  . insulin aspart  0-15 Units Subcutaneous Q4H  . lactulose  10 g Oral Daily  . mouth rinse  15 mL Mouth Rinse 10 times per day  . mupirocin ointment  1 application Nasal BID  . pantoprazole (PROTONIX) IV  40 mg Intravenous Q24H  . sennosides  5 mL Per Tube Daily  . sodium chloride flush  3 mL Intravenous Q12H   Elmarie Shiley, MD 09/08/2017, 7:34 AM

## 2017-09-20 NOTE — Anesthesia Preprocedure Evaluation (Addendum)
Anesthesia Evaluation  Patient identified by MRN, date of birth, ID bandGeneral Assessment Comment:Sedated on vent  Reviewed: Allergy & Precautions, H&P , NPO status , Patient's Chart, lab work & pertinent test results  Airway Mallampati: Intubated       Dental no notable dental hx. (+) Teeth Intact, Dental Advisory Given   Pulmonary asthma , sleep apnea , former smoker,    Pulmonary exam normal breath sounds clear to auscultation       Cardiovascular hypertension, On Medications +CHF   Rhythm:Regular Rate:Normal     Neuro/Psych  Headaches, negative psych ROS   GI/Hepatic Neg liver ROS, GERD  Medicated,  Endo/Other  diabetes, Insulin Dependent, Oral Hypoglycemic AgentsMorbid obesity  Renal/GU negative Renal ROS  negative genitourinary   Musculoskeletal   Abdominal   Peds  Hematology negative hematology ROS (+) anemia ,   Anesthesia Other Findings   Reproductive/Obstetrics negative OB ROS                            Anesthesia Physical Anesthesia Plan  ASA: IV  Anesthesia Plan: General   Post-op Pain Management:    Induction: Intravenous  PONV Risk Score and Plan: 3 and Treatment may vary due to age or medical condition  Airway Management Planned: Oral ETT  Additional Equipment:   Intra-op Plan:   Post-operative Plan: Post-operative intubation/ventilation  Informed Consent: I have reviewed the patients History and Physical, chart, labs and discussed the procedure including the risks, benefits and alternatives for the proposed anesthesia with the patient or authorized representative who has indicated his/her understanding and acceptance.   Dental advisory given  Plan Discussed with: CRNA  Anesthesia Plan Comments:         Anesthesia Quick Evaluation

## 2017-09-20 NOTE — Progress Notes (Signed)
Daily Progress Note   Patient Name: Jessica Howard       Date: 10/01/2017 DOB: Feb 12, 1961  Age: 57 y.o. MRN#: [id number] Attending Physician: Brand Males, MD Primary Care Physician: Merrilee Seashore, MD Admit Date: 09/02/2017  Reason for Consultation/Follow-up:   Subjective: Patient seen, chart reviewed.  Patient in OR this morning for debridement.  Per surgeon's note left lower extremity is compromised and potentially needing an amputation.  The surgeon did stop the case and go out and talk to the family and they elected no further amputations.  Met with patient's son, patient's 2 sisters, Jeannene Patella and [name], her brother, Lanny Hurst, and niece.  Multiple issues were addressed as follows: Underlying pathophysiology regarding sepsis, quality of life, process of weaning off the ventilator, whether to reintubate, further amputations and surgical procedures, as well as trach, PEG, the process of liberating patient's from ventilators that are unable to wean on their own   Patient's daughter was not in attendance but per her aunt Jeannene Patella, she has been in touch with her and is in agreement with what ever the family decides.  Healthcare decision makers are Mrs. Maiorino son Myna Bright and daughter [name].  Length of Stay: 15  Current Medications: Scheduled Meds:  . amiodarone  200 mg Oral Daily  . chlorhexidine gluconate (MEDLINE KIT)  15 mL Mouth Rinse BID  . Chlorhexidine Gluconate Cloth  6 each Topical Daily  . docusate  100 mg Per Tube BID  . insulin aspart  0-15 Units Subcutaneous Q4H  . lactulose  10 g Oral Daily  . mouth rinse  15 mL Mouth Rinse 10 times per day  . [START ON 09/21/2017] pantoprazole sodium  40 mg Per Tube Daily  . sennosides  5 mL Per Tube Daily  . sodium chloride flush  3  mL Intravenous Q12H    Continuous Infusions: . sodium chloride 10 mL/hr at 09/19/17 1900  . ciprofloxacin    . feeding supplement (VITAL HIGH PROTEIN) Stopped (10/02/2017 0000)  . fentaNYL infusion INTRAVENOUS 20 mcg/hr (09/10/2017 1040)  . heparin Stopped (10/01/2017 1000)  . linezolid (ZYVOX) IV    . phenylephrine (NEO-SYNEPHRINE) Adult infusion 120 mcg/min (09/10/2017 1247)  . dialysis replacement fluid (prismasate) 400 mL/hr at 09/13/2017 1433  . dialysis replacement fluid (prismasate) 200 mL/hr at 09/17/2017 1431  . dialysate (PRISMASATE) 2,000  mL/hr at 09/09/2017 1432  . sodium chloride 999 mL/hr at 09/19/2017 1431    PRN Meds: acetaminophen **OR** acetaminophen, albuterol, fentaNYL, LORazepam, ondansetron **OR** ondansetron (ZOFRAN) IV, sodium chloride  Physical Exam  Constitutional: She appears well-developed and well-nourished.  Acutely ill middle-aged female; in ICU, intubated, on pressors  HENT:  Head: Normocephalic and atraumatic.  Cardiovascular:  On pressors  Pulmonary/Chest:  Intubated  Abdominal: Soft.  Genitourinary:  Genitourinary Comments: Foley  Neurological:  Minimally responsive to touch and voice  Skin: Skin is warm and dry.  Dressings to right lower extremity clean dry and intact; left upper extremity clean dry and intact  Psychiatric:  Unable to test  Nursing note and vitals reviewed.           Vital Signs: BP (!) 110/54   Pulse 82   Temp 98.9 F (37.2 C) (Oral)   Resp (!) 22   Ht 5' 2"  (1.575 m)   Wt 134.2 kg (295 lb 13.7 oz)   LMP 12/27/2013 Comment: irreggular  SpO2 100%   BMI 54.11 kg/m  SpO2: SpO2: 100 % O2 Device: O2 Device: Ventilator O2 Flow Rate: O2 Flow Rate (L/min): 3 L/min  Intake/output summary:   Intake/Output Summary (Last 24 hours) at 09/27/2017 1621 Last data filed at 09/28/2017 1255 Gross per 24 hour  Intake 3116.96 ml  Output 110 ml  Net 3006.96 ml   LBM: Last BM Date: 09/15/2017 Baseline Weight: Weight: 117.9 kg (260 lb) Most  recent weight: Weight: 134.2 kg (295 lb 13.7 oz)       Palliative Assessment/Data:    Flowsheet Rows     Most Recent Value  Intake Tab  Referral Department  Critical care  Unit at Time of Referral  ICU  Palliative Care Primary Diagnosis  Sepsis/Infectious Disease  Date Notified  09/12/2017  Palliative Care Type  New Palliative care  Reason for referral  Clarify Goals of Care  Date of Admission  10/06/17  Date first seen by Palliative Care  09/19/17  # of days Palliative referral response time  1 Day(s)  # of days IP prior to Palliative referral  -18  Clinical Assessment  Palliative Performance Scale Score  20%  Pain Max last 24 hours  Not able to report  Pain Min Last 24 hours  Not able to report  Dyspnea Max Last 24 Hours  Not able to report  Dyspnea Min Last 24 hours  Not able to report  Nausea Max Last 24 Hours  Not able to report  Nausea Min Last 24 Hours  Not able to report  Anxiety Max Last 24 Hours  Not able to report  Anxiety Min Last 24 Hours  Not able to report  Other Max Last 24 Hours  Not able to report  Psychosocial & Spiritual Assessment  Palliative Care Outcomes  Patient/Family meeting held?  Yes  Who was at the meeting?  son  Palliative Care follow-up planned  Yes, Facility      Patient Active Problem List   Diagnosis Date Noted  . Palliative care by specialist   . History of amputation of right lower extremity through tibia and fibula ([name])   . Septic olecranon bursitis of left elbow   . Septic olecranon bursitis of right elbow   . Left wrist pain   . Goals of care, counseling/discussion   . Acute respiratory failure ([place])   . Encephalopathy   . Endotracheally intubated   . Staphylococcal arthritis of right wrist ([place])   .  Facet joint disease   . Pleural effusion   . Abscess   . Acute renal failure ([place])   . Infection of right hand 09/13/2017  . Altered mental status, unspecified   . Atrial flutter ([place])   . MSSA bacteremia 09/06/2017  .  Elevated liver enzymes 09/06/2017  . Hyperbilirubinemia 09/06/2017  . Severe sepsis with septic shock ([place]) 08/31/2017  . AKI (acute kidney injury) ([place]) 08/12/2017  . Immunosuppression due to drug therapy 09/03/2017  . Rheumatoid arthritis(714.0) 08/29/2017  . Septic joint of left wrist ([place]) 08/25/2017  . Osteomyelitis of great toe of right foot ([place]) 08/22/2017  . Hypertension 08/30/2017  . Hyperlipidemia 08/27/2017  . Acute hearing loss of left ear 08/16/2017  . Diabetes mellitus, insulin dependent (IDDM), controlled ([place]) 08/27/2017  . Beta thalassemia ([place]) 09/02/2017  . Acute anemia 08/26/2017  . Elevated C-reactive protein (CRP)   . Elevated erythrocyte sedimentation rate   . Leukocytosis   . Hip joint effusion 01/20/2016  . Right hip pain 01/19/2016  . Hip pain 01/19/2016  . Congestive heart failure (CHF) ([place]) 12/01/2015  . Anemia 12/01/2015  . CHF (congestive heart failure) ([place]) 12/01/2015  . Acute diastolic CHF (congestive heart failure) ([place])   . Right rotator cuff tear 05/20/2014  . Nontraumatic tear of right rotator cuff   . Impingement syndrome of right shoulder   . Thalassemia 02/28/2014  . Rheumatoid arthritis ([place]) 02/28/2014  . Morbid obesity ([place]) 02/28/2014  . Renal insufficiency 02/28/2014  . Acute back pain 02/28/2014  . Essential hypertension 02/28/2014  . Diabetes ([place]) 02/28/2014  . Dyslipidemia 02/28/2014  . Obstructive sleep apnea 02/28/2014  . Acute on chronic diastolic congestive heart failure, NYHA class 2 ([place]) 02/28/2014  . Iron deficiency anemia 02/16/2014  . Post-menopausal bleeding 08/28/2012    Palliative Care Assessment & Plan   Patient Profile: 57 y.o. female  with past medical history of rheumatoid arthritis on immunosuppressants, hypertension, diabetes, chronic pain, neuropathy, hyperlipidemia admitted on 08/16/2017 with diffuse, back pain, confusion, decreased p.o. intake, and decreased urine output.  Patient had been experiencing  back pain for approximately 2 weeks prior to admission..  She has since been found to have MSSA sepsis with diffuse seeding of joints and spine.  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  She also has a area to her left heel that is concerning.  She now is in acute kidney injury, with a creatinine of 3.36; prior to this had been within normal limits.  She is been undergoing I&D and also she required cardioversion secondary to atrial fib with RVR  Patient went back to the OR on 09/17/2017 for debridement and the surgeon spoke to the family that the left lower extremity likely will need amputation as well.  Family at this point is refusing further amputations.  She has been started on new antibiotics as well as resuming CRRT  Consult ordered for goals of care, care limits   Assessment: Very supportive family dealing with a very difficult, complicated medical condition.  By the end of our meeting all family members including her son Myna Bright were able to reach some decisions.  Goals of care meeting ended amicably with palliative care to have further plans to follow up with [facility] first thing in the morning.  Recommendations/Plan:  Family was able to reach unanimous decision including, Antonio, that if she were to wean off of the ventilator do not reintubate  No further amputations  Continue antibiotics for now but they recognize that we should have a better feel by the first of the week whether we are going to be successful in clearing this infection or not.  Continue with CRRT for now  I plan to speak to [facility] in the morning further regarding CODE STATUS.  Family is very overwhelmed today after speaking with the surgeon, and after lengthy [place] meeting  Her sisters and brother would make the decision to withdraw care today.  Antonio is willing to see if a few more days give [place] any further information on whether she can  clear her infection  I do get the sense that when I speak to [facility] further he will opt for DNR as he  Already has made the decision if she were to wean not to reintubate  They would not pursue a trach or a PEG  Goals of Care and Additional Recommendations:  Limitations on Scope of Treatment: No Chemotherapy, No Radiation and No Tracheostomy  Code Status:    Code Status Orders  (From admission, onward)        Start     Ordered   08/17/2017 1307  Full code  Continuous     08/28/2017 1308    Code Status History    Date Active Date Inactive Code Status Order ID Comments User Context   01/19/2016 2029 01/21/2016 2347 Full Code [id number]  Kinnie Feil, MD Inpatient   12/01/2015 0448 12/02/2015 1615 Full Code [id number]  Jani Gravel, MD Inpatient   05/20/2014 2023 05/21/2014 1353 Full Code [id number]  Blondell Reveal Inpatient   02/28/2014 0008 03/01/2014 1753 Full Code [id number]  Merton Border, MD Inpatient       Prognosis:  Family asked for my honest opinion in terms of a prognosis.  I shared that I was concerned about her now with multisystem organ failure, overwhelming sepsis.  If things were to continue to go the way they are now, I shared that I thought her prognosis could just be days to weeks.  I asked if family was surprised to hear me say that, and they said no  Discharge Planning:  To Be Determined   Thank you for allowing the Palliative Medicine Team to assist in the care of this patient.   Time In: 1400 Time Out: 1530 Total Time 90 min Prolonged Time Billed  yes       Greater than 50%  of this time was spent counseling and coordinating care related to the above assessment and plan.  Dory Horn, NP  Please contact Palliative Medicine Team phone at [phone] for questions and concerns.

## 2017-09-20 NOTE — Anesthesia Postprocedure Evaluation (Signed)
Anesthesia Post Note  Patient: Jessica Howard  Procedure(s) Performed: INCISION AND DRAINAGE SEPTIC BILATERAL OLECRANON BURSITIS (Bilateral ) RIGHT OLECRANON BURSA (Right ) INCISION AND DRAINAGE ABSCESS RIGHT HAND, LEFT FOOT AND ANKLE (N/A )     Patient location during evaluation: SICU Anesthesia Type: General Level of consciousness: sedated Pain management: pain level controlled Vital Signs Assessment: post-procedure vital signs reviewed and stable Respiratory status: patient remains intubated per anesthesia plan Cardiovascular status: stable Postop Assessment: no apparent nausea or vomiting Anesthetic complications: no    Last Vitals:  Vitals:   09/26/2017 1030 09/22/2017 1316  BP: (!) 110/54   Pulse: 82   Resp: (!) 22   Temp: (!) 38.8 C   SpO2: 100% 100%    Last Pain:  Vitals:   09/10/2017 1030  TempSrc: Oral  PainSc:                  Meeah Totino,W. EDMOND

## 2017-09-20 NOTE — Progress Notes (Signed)
Notified [facility] about limited IV access, willing to place CVC if family consented. Notified the pt's son, Myna Bright, who refused CVC placement.

## 2017-09-20 NOTE — Progress Notes (Signed)
PHARMACY NOTE:  ANTIMICROBIAL RENAL DOSAGE ADJUSTMENT  Current antimicrobial regimen includes a mismatch between antimicrobial dosage and estimated renal function.  As per policy approved by the Pharmacy & Therapeutics and Medical Executive Committees, the antimicrobial dosage will be adjusted accordingly.  Current antimicrobial dosage: Cipro 400 Q 24   Indication: Gram negative rod in left ankle culture  Renal Function:  Estimated Creatinine Clearance: 20.1 mL/min (A) (by C-G formula based on SCr of 4.17 mg/dL (H)). []      On intermittent HD, scheduled: [x]      On CRRT    Antimicrobial dosage has been changed to:  Cipro 400 mg Q 12   Additional comments:   Thank you for allowing pharmacy to be a part of this patient's care.  Jimmy Footman, PharmD, BCPS PGY2 Infectious Diseases Pharmacy Resident Pager: [phone]   09/27/2017 11:24 AM

## 2017-09-20 NOTE — Progress Notes (Signed)
CRITICAL VALUE ALERT  Critical Value:  Hemoglobin 6.9  Date & Time Notied:  09/30/2017  7357  Provider Notified: Bowell MD  Orders Received/Actions taken: MD paged awaiting return call, will pass information to day team

## 2017-09-20 NOTE — Transfer of Care (Signed)
Immediate Anesthesia Transfer of Care Note  Patient: Jessica Howard  Procedure(s) Performed: INCISION AND DRAINAGE SEPTIC BILATERAL OLECRANON BURSITIS (Bilateral ) RIGHT OLECRANON BURSA (Right ) INCISION AND DRAINAGE ABSCESS RIGHT HAND, LEFT FOOT AND ANKLE (N/A )  Patient Location: ICU  Anesthesia Type:General  Level of Consciousness: Patient remains intubated per anesthesia plan  Airway & Oxygen Therapy: Patient remains intubated per anesthesia plan and Patient placed on Ventilator (see vital sign flow sheet for setting)  Post-op Assessment: Report given to RN and Post -op Vital signs reviewed and stable  Post vital signs: Reviewed and stable  Last Vitals:  Vitals Value Taken Time  BP    Temp    Pulse    Resp    SpO2      Last Pain:  Vitals:   10/07/2017 1030  TempSrc: Oral  PainSc:       Patients Stated Pain Goal: 0 (05/69/79 4801)  Complications: No apparent anesthesia complications

## 2017-09-20 NOTE — Progress Notes (Signed)
ANTICOAGULATION CONSULT NOTE - Follow Up Consult  Pharmacy Consult for Heparin Indication: atrial fibrillation  Allergies  Allergen Reactions  . Bactrim [Sulfamethoxazole-Trimethoprim] Hives  . Cefuroxime Axetil Other (See Comments)    Blistering wounds per family  . Cephalosporins Other (See Comments)    Blistering wounds as an adult per family  . Lisinopril Cough  . Penicillins Hives  . Iohexol Itching and Rash     Code: RASH, Desc: HAD ITCHING AND A RASH ABOUT ONE HOUR AFTER RETURNING HOME FROM THE CT, Onset Date: [id number]   . Sulfa Antibiotics Hives    Patient Measurements: Height: 5\' 2"  (157.5 cm) Weight: 295 lb 13.7 oz (134.2 kg) IBW/kg (Calculated) : 50.1 Heparin Dosing Weight: 81 kg  Vital Signs: Temp: 98 F (36.7 C) (04/13 1925) Temp Source: Oral (04/13 1925) BP: 156/93 (04/13 2000) Pulse Rate: 87 (04/13 2007)  Labs: Recent Labs    10/05/2017 0515  09/19/17 0514 09/19/17 1222 09/19/17 2103 09/15/2017 0528 09/22/2017 1646  HGB 7.5*  --  7.9*  --   --  6.9* 8.9*  HCT 23.1*  --  24.5*  --   --  21.3* 27.2*  PLT 213  --  197  --   --  215  --   HEPARINUNFRC 0.23*   < >  --  0.16* <0.10* 0.16*  --   CREATININE 2.51*  --  3.43*  --   --  4.17* 3.96*  CKTOTAL  --   --  1,911*  --   --   --   --    < > = values in this interval not displayed.    Estimated Creatinine Clearance: 21.2 mL/min (A) (by C-G formula based on SCr of 3.96 mg/dL (H)).   Assessment: 64 yoF with disseminated MSSA and rapid AFib now s/p DCCV on 4/4. Pt has been on heparin intermittently in between holding for surgeries, resumed 4/11 afternoon. Heparin held again this AM around 1000 for I and D today of left elbow, right elbow, left heel and irrigation of right hand. Patient is now back from OR and is having significant oozing from her elbow per RN. HgB this AM was also down to 6.9 and patient has required 2 units PRBC.    Per RN Dr. Tamala Julian desires to resume heparin infusion this evening.   Goal  of Therapy:  Heparin level 0.3-0.7 units/ml Monitor platelets by anticoagulation protocol: Yes   Plan:  - Resume heparin infusion at previous rate of 2800 units/hr  -Heparin level in am  Vincenza Hews, PharmD, BCPS 09/15/2017, 9:23 PM

## 2017-09-21 ENCOUNTER — Inpatient Hospital Stay (HOSPITAL_COMMUNITY): Payer: Medicare Other

## 2017-09-21 DIAGNOSIS — Z5181 Encounter for therapeutic drug level monitoring: Secondary | ICD-10-CM

## 2017-09-21 LAB — TYPE AND SCREEN
ABO/RH(D): A POS
ANTIBODY SCREEN: POSITIVE
Donor AG Type: NEGATIVE
Donor AG Type: NEGATIVE
UNIT DIVISION: 0
Unit division: 0

## 2017-09-21 LAB — GLUCOSE, CAPILLARY
GLUCOSE-CAPILLARY: 111 mg/dL — AB (ref 65–99)
GLUCOSE-CAPILLARY: 155 mg/dL — AB (ref 65–99)
GLUCOSE-CAPILLARY: 162 mg/dL — AB (ref 65–99)
Glucose-Capillary: 103 mg/dL — ABNORMAL HIGH (ref 65–99)
Glucose-Capillary: 114 mg/dL — ABNORMAL HIGH (ref 65–99)
Glucose-Capillary: 155 mg/dL — ABNORMAL HIGH (ref 65–99)
Glucose-Capillary: 178 mg/dL — ABNORMAL HIGH (ref 65–99)

## 2017-09-21 LAB — AEROBIC/ANAEROBIC CULTURE W GRAM STAIN (SURGICAL/DEEP WOUND)

## 2017-09-21 LAB — RENAL FUNCTION PANEL
Albumin: 1.1 g/dL — ABNORMAL LOW (ref 3.5–5.0)
Anion gap: 8 (ref 5–15)
BUN: 44 mg/dL — AB (ref 6–20)
CO2: 23 mmol/L (ref 22–32)
CREATININE: 2.78 mg/dL — AB (ref 0.44–1.00)
Calcium: 7.3 mg/dL — ABNORMAL LOW (ref 8.9–10.3)
Chloride: 99 mmol/L — ABNORMAL LOW (ref 101–111)
GFR calc Af Amer: 21 mL/min — ABNORMAL LOW (ref >60)
GFR, EST NON AFRICAN AMERICAN: 18 mL/min — AB (ref >60)
Glucose, Bld: 126 mg/dL — ABNORMAL HIGH (ref 65–99)
Phosphorus: 3.3 mg/dL (ref 2.5–4.6)
Potassium: 3.9 mmol/L (ref 3.5–5.1)
SODIUM: 130 mmol/L — AB (ref 135–145)

## 2017-09-21 LAB — CBC
HCT: 26.6 % — ABNORMAL LOW (ref 36.0–46.0)
Hemoglobin: 8.6 g/dL — ABNORMAL LOW (ref 12.0–15.0)
MCH: 26.9 pg (ref 26.0–34.0)
MCHC: 32.3 g/dL (ref 30.0–36.0)
MCV: 83.1 fL (ref 78.0–100.0)
Platelets: 194 10*3/uL (ref 150–400)
RBC: 3.2 MIL/uL — ABNORMAL LOW (ref 3.87–5.11)
RDW: 19.1 % — AB (ref 11.5–15.5)
WBC: 10 10*3/uL (ref 4.0–10.5)

## 2017-09-21 LAB — CULTURE, BLOOD (ROUTINE X 2)
Culture: NO GROWTH
SPECIAL REQUESTS: ADEQUATE

## 2017-09-21 LAB — BPAM RBC
BLOOD PRODUCT EXPIRATION DATE: 201905012359
Blood Product Expiration Date: 201905022359
ISSUE DATE / TIME: 201904130935
ISSUE DATE / TIME: 201904130935
Unit Type and Rh: 6200
Unit Type and Rh: 6200

## 2017-09-21 LAB — HEPATIC FUNCTION PANEL
ALK PHOS: 405 U/L — AB (ref 38–126)
ALT: 32 U/L (ref 14–54)
AST: 83 U/L — ABNORMAL HIGH (ref 15–41)
Albumin: 1.1 g/dL — ABNORMAL LOW (ref 3.5–5.0)
BILIRUBIN DIRECT: 4.2 mg/dL — AB (ref 0.1–0.5)
Indirect Bilirubin: 1.9 mg/dL — ABNORMAL HIGH (ref 0.3–0.9)
TOTAL PROTEIN: 5.5 g/dL — AB (ref 6.5–8.1)
Total Bilirubin: 6.1 mg/dL — ABNORMAL HIGH (ref 0.3–1.2)

## 2017-09-21 LAB — CK: CK TOTAL: 793 U/L — AB (ref 38–234)

## 2017-09-21 LAB — BODY FLUID CULTURE: Culture: NO GROWTH

## 2017-09-21 LAB — AEROBIC CULTURE W GRAM STAIN (SUPERFICIAL SPECIMEN)

## 2017-09-21 LAB — HEPARIN LEVEL (UNFRACTIONATED): Heparin Unfractionated: 0.69 IU/mL (ref 0.30–0.70)

## 2017-09-21 LAB — MAGNESIUM: MAGNESIUM: 1.9 mg/dL (ref 1.7–2.4)

## 2017-09-21 LAB — AEROBIC CULTURE  (SUPERFICIAL SPECIMEN)

## 2017-09-21 LAB — AEROBIC/ANAEROBIC CULTURE (SURGICAL/DEEP WOUND)

## 2017-09-21 NOTE — Progress Notes (Signed)
Patient ID: Jessica Howard, female   DOB: 19-Jul-1960, 57 y.o.   MRN: [id number] [facility] Progress Note   Assessment/ Plan:    1. Acute kidney injury: Likely from ATN associated with sepsis, remains anuric and without any evident renal recovery.  Continue CRRT at this time for clearance/volume unloading.  Overall prognosis appears poor with metastatic infection and risk for limb loss. 2. MSSA septicemia with diffuseseeding of joints/spine: S/P right BKA and left hand at forearm level amputation for osteomyelitis/joint+ fascial space infection status post irrigation and debridement yesterday with left elbow olecranon bursectomy and right elbow olecranon bursectomy.  Remains on daptomycin and ciprofloxacin. 3. Atrial fibrillation currently sinus rhythm and rate controlled on amiodarone s/p DCCV. 4. Abnormal LFT's-likely shock liver associated with bacteremia/sepsis.  With severe hypoalbuminemia secondary to acute illness/infection with downtrending transaminases. 5. Anemia of acute illness-secondary to acute illness/surgical losses and bleeding from amputation site requiring temporary cessation of heparin drip, status post PRBC transfusion 6. Hyponatremia-secondary to acute kidney injury/free water excretion defect in this anuric patient, continue to monitor on CRRT 7. Rheumatoid arthritis    Subjective:   Off pressors, status post irrigation and debridement in OR yesterday.  Her son does not want any other efforts at trying to get intravenous access and inquires if CRRT can be interrupted for antibiotics/medications.   Objective:   BP 126/79   Pulse 85   Temp 98.1 F (36.7 C) (Oral)   Resp (!) 22   Ht 5' 2"  (1.575 m)   Wt 133.1 kg (293 lb 6.9 oz)   LMP 12/27/2013 Comment: irreggular  SpO2 100%   BMI 53.67 kg/m   Intake/Output Summary (Last 24 hours) at 09/21/2017 0755 Last data filed at 09/21/2017 0700 Gross per 24 hour  Intake 3553.23 ml  Output 2327 ml  Net  1226.23 ml   Weight change: -1.1 kg (-2 lb 6.8 oz)  Physical Exam: Gen: Intubated, sleeping comfortably CVS: Pulse irregular rhythm, normal rate, S1 and S2 normal Resp: Mechanical breath sounds bilaterally, no distinct rales or rhonchi Abd: Soft, obese, nontender Ext: Right arm in dressing, s/p R BKA, status post left hand amputation at forearm level, right hand index fingertip amputated  Imaging: Dg Chest Port 1 View  Result Date: 09/21/2017 CLINICAL DATA:  Endotracheal tube present EXAM: PORTABLE CHEST 1 VIEW COMPARISON:  Chest x-rays dated 09/22/2017 and 09/19/2017. FINDINGS: Endotracheal tube remains well positioned with tip approximately 3 cm above the carina. Enteric tube passes below the diaphragm. RIGHT IJ catheter is stable in position with tip overlying the lower SVC/cavoatrial junction. Improved aeration within the perihilar and lower lung regions bilaterally. Mild central pulmonary vascular congestion persists. Persistent hazy opacity at the RIGHT lung base likely represents small pleural effusion and/or atelectasis. No pneumothorax seen. IMPRESSION: 1. Improved lung aeration bilaterally suggesting improved fluid status. Persistent mild central pulmonary vascular congestion. 2. Probable small RIGHT pleural effusion and/or atelectasis. 3. Support apparatus remains appropriately positioned. Electronically Signed   By: Franki Cabot M.D.   On: 09/21/2017 07:26   Dg Chest Port 1 View  Result Date: 09/13/2017 CLINICAL DATA:  Intubation. EXAM: PORTABLE CHEST 1 VIEW COMPARISON:  Chest x-rays dated 09/19/2017 and 09/17/2017. FINDINGS: Endotracheal tube appears well positioned with tip approximately 2.8 cm above the level of the carina. Enteric tube passes below the diaphragm. RIGHT IJ central line is well position with tip at the level of the lower SVC/cavoatrial junction. An additional line overlies the upper mediastinum and LEFT bronchus, suspected to  be extraneous to the patient. There is  persistent central pulmonary vascular congestion and mild interstitial edema. Vague opacities are again seen at each lung base, likely layering pleural effusions. No pneumothorax seen. IMPRESSION: 1. Endotracheal tube, enteric tube and RIGHT IJ central line remain appropriately positioned, as detailed above. 2. Additional wire/line overlying the upper mediastinum and LEFT lower chest which is presumed to be extraneous to the patient. Patient's nurse Jessica Howard confirms that there is only 1 enteric tube. 3. Continued central pulmonary vascular congestion and mild bilateral interstitial edema indicating mild volume overload. 4. Veiled opacities at the bilateral lung bases, compatible with small layering pleural effusions and/or atelectasis. Electronically Signed   By: Franki Cabot M.D.   On: 09/08/2017 07:29    Labs: BMET Recent Labs  Lab 09/21/2017 0500 09/17/2017 1731 09/17/17 0540 09/15/2017 0515 09/19/17 0514 09/28/2017 0528 09/15/2017 1646 09/21/17 0503  NA 131* 137 133* 132* 130* 128* 129* 130*  K 5.7* 4.7 5.1 3.7 4.2 5.7* 3.7 3.9  CL 101  --  99* 97* 96* 96* 98* 99*  CO2 19*  --  23 26 24 23  21* 23  GLUCOSE 112* 89 118* 112* 123* 96 100* 126*  BUN 42*  --  36* 28* 50* 66* 62* 44*  CREATININE 4.06*  --  3.13* 2.51* 3.43* 4.17* 3.96* 2.78*  CALCIUM 7.5*  --  7.5* 7.4* 7.5* 7.3* 7.3* 7.3*  PHOS 7.0*  --  6.6* 4.0 4.4 3.9 4.1 3.3   CBC Recent Labs  Lab 09/15/2017 0515 09/19/17 0514 10/01/2017 0528 09/13/2017 1646 09/21/17 0503  WBC 16.7* 14.0* 10.8*  --  10.0  HGB 7.5* 7.9* 6.9* 8.9* 8.6*  HCT 23.1* 24.5* 21.3* 27.2* 26.6*  MCV 81.9 83.9 82.2  --  83.1  PLT 213 197 215  --  194    Medications:    . amiodarone  200 mg Oral Daily  . chlorhexidine gluconate (MEDLINE KIT)  15 mL Mouth Rinse BID  . Chlorhexidine Gluconate Cloth  6 each Topical Daily  . docusate  100 mg Per Tube BID  . insulin aspart  0-15 Units Subcutaneous Q4H  . lactulose  10 g Oral Daily  . mouth rinse  15 mL Mouth Rinse  10 times per day  . pantoprazole sodium  40 mg Per Tube Daily  . sennosides  5 mL Per Tube Daily  . sodium chloride flush  3 mL Intravenous Q12H   Elmarie Shiley, MD 09/21/2017, 7:55 AM

## 2017-09-21 NOTE — Progress Notes (Signed)
Pharmacy Antibiotic Note  Jessica Howard is a 57 y.o. female admitted on 09/03/2017 with MSSA bacteremia.  Pharmacy has been consulted for linezolid and ciprofloxacin dosing. Pt now s/p OR x3 for I&D of septic joints, right BKA and left UE amputation.  Patient was switched from Daptomycin to Linezolid on 4/12 due to an elevated CK. Ciprofloxacin was added for rare gram negative rods in left ankle gram stain which has not grown on culture. CK today is down to 793 from 1911 on Friday. Platelets are stable. CRRT was re-initiated last PM.     Plan: - Continue Linezolid 600 mg BID - Continue Ciprofloxacin 400 mg every 12 hours -  Monitor CRRT, platelets and patient clinical status   Height: 5\' 2"  (157.5 cm) Weight: 293 lb 6.9 oz (133.1 kg) IBW/kg (Calculated) : 50.1  Temp (24hrs), Avg:99.1 F (37.3 C), Min:97.6 F (36.4 C), Max:101.8 F (38.8 C)  Recent Labs  Lab 09/17/17 0540 10/06/2017 0515 09/19/17 0514 09/13/2017 0528 09/09/2017 1646 09/21/17 0503  WBC 14.2* 16.7* 14.0* 10.8*  --  10.0  CREATININE 3.13* 2.51* 3.43* 4.17* 3.96* 2.78*    Estimated Creatinine Clearance: 30.1 mL/min (A) (by C-G formula based on SCr of 2.78 mg/dL (H)).    Allergies  Allergen Reactions  . Bactrim [Sulfamethoxazole-Trimethoprim] Hives  . Cefuroxime Axetil Other (See Comments)    Blistering wounds per family  . Cephalosporins Other (See Comments)    Blistering wounds as an adult per family  . Lisinopril Cough  . Penicillins Hives  . Iohexol Itching and Rash     Code: RASH, Desc: HAD ITCHING AND A RASH ABOUT ONE HOUR AFTER RETURNING HOME FROM THE CT, Onset Date: [id number]   . Sulfa Antibiotics Hives   Jimmy Footman, PharmD, BCPS PGY2 Infectious Diseases Pharmacy Resident Pager: [phone]  09/21/2017 9:43 AM

## 2017-09-21 NOTE — Progress Notes (Signed)
Daily Progress Note   Patient Name: Jessica Howard       Date: 09/21/2017 DOB: 1961/03/21  Age: 57 y.o. MRN#: [id number] Attending Physician: Brand Males, MD Primary Care Physician: Merrilee Seashore, MD Admit Date: 08/20/2017  Reason for Consultation/Follow-up: Establishing goals of care, Psychosocial/spiritual support and Terminal Care  Subjective: Chart reviewed, met with patient, and patient's son [place].  Antonio coping fairly well.  He states "my mom is no longer in there".  He is trying to remain hopeful that antibiotics will begin to reverse her sepsis but he is showing good insight that this is becoming more unlikely by the day; especially after the news they received from the surgeon on 09/28/2017 during debridement process that further amputations were likely necessary.  After speaking with Antonio this morning, he has elected DNR.  As noted from note 09/13/2017, decision has been made if extubated, to not reintubate  Length of Stay: 16  Current Medications: Scheduled Meds:  . amiodarone  200 mg Oral Daily  . chlorhexidine gluconate (MEDLINE KIT)  15 mL Mouth Rinse BID  . Chlorhexidine Gluconate Cloth  6 each Topical Daily  . docusate  100 mg Per Tube BID  . insulin aspart  0-15 Units Subcutaneous Q4H  . lactulose  10 g Oral Daily  . mouth rinse  15 mL Mouth Rinse 10 times per day  . pantoprazole sodium  40 mg Per Tube Daily  . sennosides  5 mL Per Tube Daily  . sodium chloride flush  3 mL Intravenous Q12H    Continuous Infusions: . sodium chloride Stopped (09/21/17 0500)  . ciprofloxacin Stopped (10/06/2017 1951)  . feeding supplement (VITAL HIGH PROTEIN) 1,000 mL (09/21/17 1435)  . fentaNYL infusion INTRAVENOUS 250 mcg/hr (09/21/17 1350)  . heparin 999 mL/hr at  09/19/2017 2240  . linezolid (ZYVOX) IV Stopped (09/21/17 0939)  . phenylephrine (NEO-SYNEPHRINE) Adult infusion Stopped (09/27/2017 1950)  . dialysis replacement fluid (prismasate) 400 mL/hr at 09/21/17 1520  . dialysis replacement fluid (prismasate) 200 mL/hr at 09/27/2017 1431  . dialysate (PRISMASATE) 2,000 mL/hr at 09/21/17 1247    PRN Meds: acetaminophen **OR** acetaminophen, albuterol, fentaNYL, heparin, LORazepam, ondansetron **OR** ondansetron (ZOFRAN) IV  Physical Exam  Constitutional: She appears well-developed and well-nourished.  Acutely ill appearing middle-aged female seen in ICU; intubated, on CRRT  HENT:  Head: Normocephalic and atraumatic.  Cardiovascular: Normal rate.  Pulmonary/Chest:  Intubated  Genitourinary:  Genitourinary Comments: Foley  Neurological:  Minimally responsive  Skin: Skin is warm and dry.  Psychiatric:  No overt agitation otherwise unable to test  Nursing note and vitals reviewed.           Vital Signs: BP 126/79   Pulse 85   Temp (!) 97.4 F (36.3 C) (Oral)   Resp (!) 22   Ht 5' 2"  (1.575 m)   Wt 133.1 kg (293 lb 6.9 oz)   LMP 12/27/2013 Comment: irreggular  SpO2 100%   BMI 53.67 kg/m  SpO2: SpO2: 100 % O2 Device: O2 Device: Ventilator O2 Flow Rate: O2 Flow Rate (L/min): 3 L/min  Intake/output summary:   Intake/Output Summary (Last 24 hours) at 09/21/2017 1535 Last data filed at 09/21/2017 1500 Gross per 24 hour  Intake 2935.73 ml  Output 3609 ml  Net -673.27 ml   LBM: Last BM Date: 09/22/2017 Baseline Weight: Weight: 117.9 kg (260 lb) Most recent weight: Weight: 133.1 kg (293 lb 6.9 oz)       Palliative Assessment/Data:    Flowsheet Rows     Most Recent Value  Intake Tab  Referral Department  Critical care  Unit at Time of Referral  ICU  Palliative Care Primary Diagnosis  Sepsis/Infectious Disease  Date Notified  09/30/2017  Palliative Care Type  New Palliative care  Reason for referral  Clarify Goals of Care  Date of  Admission  10/06/17  Date first seen by Palliative Care  09/19/17  # of days Palliative referral response time  1 Day(s)  # of days IP prior to Palliative referral  -18  Clinical Assessment  Palliative Performance Scale Score  20%  Pain Max last 24 hours  Not able to report  Pain Min Last 24 hours  Not able to report  Dyspnea Max Last 24 Hours  Not able to report  Dyspnea Min Last 24 hours  Not able to report  Nausea Max Last 24 Hours  Not able to report  Nausea Min Last 24 Hours  Not able to report  Anxiety Max Last 24 Hours  Not able to report  Anxiety Min Last 24 Hours  Not able to report  Other Max Last 24 Hours  Not able to report  Psychosocial & Spiritual Assessment  Palliative Care Outcomes  Patient/Family meeting held?  Yes  Who was at the meeting?  son  Palliative Care follow-up planned  Yes, Facility      Patient Active Problem List   Diagnosis Date Noted  . Palliative care by specialist   . History of amputation of right lower extremity through tibia and fibula ([place])   . Septic olecranon bursitis of left elbow   . Septic olecranon bursitis of right elbow   . Left wrist pain   . Goals of care, counseling/discussion   . Acute respiratory failure ([place])   . Encephalopathy   . Endotracheally intubated   . Staphylococcal arthritis of right wrist ([place])   . Facet joint disease   . Pleural effusion   . Abscess   . Acute renal failure ([place])   . Infection of right hand 09/25/2017  . Altered mental status, unspecified   . Atrial flutter ([place])   . MSSA bacteremia 09/06/2017  . Elevated liver enzymes 09/06/2017  . Hyperbilirubinemia 09/06/2017  . Severe sepsis with septic shock ([place]) 08/29/2017  . AKI (acute kidney injury) ([place]) 08/14/2017  . Immunosuppression  due to drug therapy 08/18/2017  . Rheumatoid arthritis(714.0) 08/11/2017  . Septic joint of left wrist ([place]) 09/02/2017  . Osteomyelitis of great toe of right foot ([place]) 09/06/2017  . Hypertension 08/10/2017  .  Hyperlipidemia 08/30/2017  . Acute hearing loss of left ear 08/15/2017  . Diabetes mellitus, insulin dependent (IDDM), controlled ([place]) 08/30/2017  . Beta thalassemia ([place]) 08/20/2017  . Acute anemia 09/04/2017  . Elevated C-reactive protein (CRP)   . Elevated erythrocyte sedimentation rate   . Leukocytosis   . Hip joint effusion 01/20/2016  . Right hip pain 01/19/2016  . Hip pain 01/19/2016  . Congestive heart failure (CHF) ([place]) 12/01/2015  . Anemia 12/01/2015  . CHF (congestive heart failure) ([place]) 12/01/2015  . Acute diastolic CHF (congestive heart failure) ([place])   . Right rotator cuff tear 05/20/2014  . Nontraumatic tear of right rotator cuff   . Impingement syndrome of right shoulder   . Thalassemia 02/28/2014  . Rheumatoid arthritis ([place]) 02/28/2014  . Morbid obesity ([place]) 02/28/2014  . Renal insufficiency 02/28/2014  . Acute back pain 02/28/2014  . Essential hypertension 02/28/2014  . Diabetes ([place]) 02/28/2014  . Dyslipidemia 02/28/2014  . Obstructive sleep apnea 02/28/2014  . Acute on chronic diastolic congestive heart failure, NYHA class 2 ([place]) 02/28/2014  . Iron deficiency anemia 02/16/2014  . Post-menopausal bleeding 08/28/2012    Palliative Care Assessment & Plan   Patient Profile: Patient seen, chart reviewed.  Patient in OR this morning for debridement.  Per surgeon's note left lower extremity is compromised and potentially needing an amputation.  The surgeon did stop the case and go out and talk to the family and they elected no further amputations.  Met with patient's son, patient's 2 sisters, Jeannene Patella and [name], her brother, Lanny Hurst, and niece.  Multiple issues were addressed as follows: Underlying pathophysiology regarding sepsis, quality of life, process of weaning off the ventilator, whether to reintubate, further amputations and surgical procedures, as well as trach, PEG, the process of liberating patient's from ventilators that are unable to wean on their  own  Patient's daughter was not in attendance but per her aunt Jeannene Patella, she has been in touch with her and is in agreement with what ever the family decides.  Healthcare decision makers are Mrs. Barbee son Myna Bright and daughter [name].  Recommendations/Plan:  Patient now DNR  If extubated, do not reintubate  Continue CRRT for now  To new antibiotics for now  No further surgeries  Anticipate this moving towards one-way extubation this week .  Palliative medicine to stay involved and continue to help support patient/family and facilitate one-way extubation if desired  Goals of Care and Additional Recommendations:  Limitations on Scope of Treatment: No Surgical Procedures and No Tracheostomy  Code Status:    Code Status Orders  (From admission, onward)        Start     Ordered   09/21/17 0856  Do not attempt resuscitation (DNR)  Continuous    Question Answer Comment  In the event of cardiac or respiratory ARREST Do not call a "code blue"   In the event of cardiac or respiratory ARREST Do not perform Intubation, CPR, defibrillation or ACLS   In the event of cardiac or respiratory ARREST Use medication by any route, position, wound care, and other measures to relive pain and suffering. May use oxygen, suction and manual treatment of airway obstruction as needed for comfort.      09/21/17 0855    Code Status History  Date Active Date Inactive Code Status Order ID Comments User Context   08/25/2017 1308 09/21/2017 0854 Full Code [id number]  Samella Parr, NP ED   01/19/2016 2029 01/21/2016 2347 Full Code [id number]  Kinnie Feil, MD Inpatient   12/01/2015 0448 12/02/2015 1615 Full Code [id number]  Jani Gravel, MD Inpatient   05/20/2014 2023 05/21/2014 1353 Full Code [id number]  Blondell Reveal Inpatient   02/28/2014 0008 03/01/2014 1753 Full Code [id number]  Merton Border, MD Inpatient       Prognosis:    Family asked for my honest opinion during family meeting on  09/15/2017, in terms of a prognosis.  I shared that I was concerned about her now with multisystem organ failure, overwhelming sepsis.  If things were to continue to go the way they are now, I shared that I thought her prognosis could just be days to weeks.  I asked if family was surprised to hear me say that, and they said no   Discharge Planning:  To Be Determined   Thank you for allowing the Palliative Medicine Team to assist in the care of this patient.   Time In: 0830 Time Out: 0915 Total Time 45 min Prolonged Time Billed  no       Greater than 50%  of this time was spent counseling and coordinating care related to the above assessment and plan.  Dory Horn, NP  Please contact Palliative Medicine Team phone at [phone] for questions and concerns.

## 2017-09-21 NOTE — Progress Notes (Signed)
[facility]   Reason for visit: Follow up on Staph aureus bacteremia  Interval History: Staph aureus growth in muliple cultures; blood culture 4/9 ngtd.   Physical Exam: Constitutional:  Vitals:   09/21/17 1156 09/21/17 1210  BP:    Pulse:    Resp:    Temp:  (!) 97.4 F (36.3 C)  SpO2: 100%    patient sedated Eyes: anicteric HENT: +ET Respiratory: Normal respiratory effort; CTA B Cardiovascular: RRR GI: soft, nt, nd  Review of Systems: Unable to be assessed due to mental status  Lab Results  Component Value Date   WBC 10.0 09/21/2017   HGB 8.6 (L) 09/21/2017   HCT 26.6 (L) 09/21/2017   MCV 83.1 09/21/2017   PLT 194 09/21/2017    Lab Results  Component Value Date   CREATININE 2.78 (H) 09/21/2017   BUN 44 (H) 09/21/2017   NA 130 (L) 09/21/2017   K 3.9 09/21/2017   CL 99 (L) 09/21/2017   CO2 23 09/21/2017    Lab Results  Component Value Date   ALT 32 09/21/2017   AST 83 (H) 09/21/2017   ALKPHOS 405 (H) 09/21/2017     Microbiology: Recent Results (from the past 240 hour(s))  Aerobic/Anaerobic Culture (surgical/deep wound)     Status: None   Collection Time: 09/12/17  1:53 PM  Result Value Ref Range Status   Specimen Description ABSCESS  Final   Special Requests Normal  Final   Gram Stain   Final    ABUNDANT WBC PRESENT, PREDOMINANTLY PMN ABUNDANT GRAM POSITIVE COCCI IN CLUSTERS    Culture   Final    FEW STAPHYLOCOCCUS AUREUS NO ANAEROBES ISOLATED Performed at [facility], [address], [place], [place] [address]    Report Status 09/17/2017 FINAL  Final   Organism ID, Bacteria STAPHYLOCOCCUS AUREUS  Final      Susceptibility   Staphylococcus aureus - MIC*    CIPROFLOXACIN <=0.5 SENSITIVE Sensitive     ERYTHROMYCIN <=0.25 SENSITIVE Sensitive     GENTAMICIN <=0.5 SENSITIVE Sensitive     OXACILLIN 0.5 SENSITIVE Sensitive     TETRACYCLINE <=1 SENSITIVE Sensitive     VANCOMYCIN <=0.5 SENSITIVE Sensitive    TRIMETH/SULFA <=10 SENSITIVE Sensitive     CLINDAMYCIN <=0.25 SENSITIVE Sensitive     RIFAMPIN <=0.5 SENSITIVE Sensitive     Inducible Clindamycin NEGATIVE Sensitive     * FEW STAPHYLOCOCCUS AUREUS  Blood culture (routine x 2)     Status: None   Collection Time: 09/26/2017  7:17 AM  Result Value Ref Range Status   Specimen Description BLOOD LEFT ANTECUBITAL  Final   Special Requests   Final    BOTTLES DRAWN AEROBIC AND ANAEROBIC Blood Culture adequate volume   Culture   Final    NO GROWTH 5 DAYS Performed at [facility], [address] [place], [place] [address]    Report Status 09/21/2017 FINAL  Final  Aerobic/Anaerobic Culture (surgical/deep wound)     Status: None   Collection Time: 09/25/2017  5:00 PM  Result Value Ref Range Status   Specimen Description WOUND  Final   Special Requests LEFT ELBOW PT ON DAPTOMYCIN  Final   Gram Stain   Final    RARE WBC PRESENT, PREDOMINANTLY PMN FEW GRAM POSITIVE COCCI    Culture   Final    MODERATE STAPHYLOCOCCUS AUREUS NO ANAEROBES ISOLATED Performed at [facility], [place] [address], [place], [place] [address]  Report Status 09/21/2017 FINAL  Final   Organism ID, Bacteria STAPHYLOCOCCUS AUREUS  Final      Susceptibility   Staphylococcus aureus - MIC*    CIPROFLOXACIN <=0.5 SENSITIVE Sensitive     ERYTHROMYCIN <=0.25 SENSITIVE Sensitive     GENTAMICIN <=0.5 SENSITIVE Sensitive     OXACILLIN 0.5 SENSITIVE Sensitive     TETRACYCLINE <=1 SENSITIVE Sensitive     VANCOMYCIN <=0.5 SENSITIVE Sensitive     TRIMETH/SULFA <=10 SENSITIVE Sensitive     CLINDAMYCIN <=0.25 SENSITIVE Sensitive     RIFAMPIN <=0.5 SENSITIVE Sensitive     Inducible Clindamycin NEGATIVE Sensitive     * MODERATE STAPHYLOCOCCUS AUREUS  Surgical PCR screen     Status: Abnormal   Collection Time: 09/17/17  8:56 PM  Result Value Ref Range Status   MRSA, PCR NEGATIVE NEGATIVE Final   Staphylococcus aureus POSITIVE (A) NEGATIVE Final    Comment: (NOTE) The  Xpert SA Assay (FDA approved for NASAL specimens in patients 78 years of age and older), is one component of a comprehensive surveillance program. It is not intended to diagnose infection nor to guide or monitor treatment. Performed at [facility], [place] [address], [place], [place] [address]   Body fluid culture     Status: None   Collection Time: 09/17/2017  9:47 AM  Result Value Ref Range Status   Specimen Description SYNOVIAL RIGHT ELBOW  Final   Special Requests PATIENT ON FOLLOWING DAPTOMYCIN SPEC A  Final   Gram Stain   Final    RARE WBC PRESENT, PREDOMINANTLY MONONUCLEAR NO ORGANISMS SEEN    Culture   Final    NO GROWTH 3 DAYS Performed at [facility], [place] [address] [place], [place] [address]    Report Status 09/21/2017 FINAL  Final  Aerobic/Anaerobic Culture (surgical/deep wound)     Status: None (Preliminary result)   Collection Time: 09/09/2017 10:21 AM  Result Value Ref Range Status   Specimen Description ABSCESS RIGHT ELBOW BURSA  Final   Special Requests SPEC B PATIENT ON FOLLOWING DAPTOMYCIN  Final   Gram Stain   Final    RARE WBC PRESENT, PREDOMINANTLY PMN FEW GRAM POSITIVE COCCI IN PAIRS IN CLUSTERS Performed at [facility], [place] [address] [place], [place] [address]    Culture   Final    FEW STAPHYLOCOCCUS AUREUS NO ANAEROBES ISOLATED; CULTURE IN PROGRESS FOR 5 DAYS    Report Status PENDING  Incomplete   Organism ID, Bacteria STAPHYLOCOCCUS AUREUS  Final      Susceptibility   Staphylococcus aureus - MIC*    CIPROFLOXACIN <=0.5 SENSITIVE Sensitive     ERYTHROMYCIN <=0.25 SENSITIVE Sensitive     GENTAMICIN <=0.5 SENSITIVE Sensitive     OXACILLIN 0.5 SENSITIVE Sensitive     TETRACYCLINE <=1 SENSITIVE Sensitive     VANCOMYCIN <=0.5 SENSITIVE Sensitive     TRIMETH/SULFA <=10 SENSITIVE Sensitive     CLINDAMYCIN <=0.25 SENSITIVE Sensitive     RIFAMPIN <=0.5 SENSITIVE Sensitive     Inducible Clindamycin NEGATIVE Sensitive     * FEW  STAPHYLOCOCCUS AUREUS  Aerobic/Anaerobic Culture (surgical/deep wound)     Status: None (Preliminary result)   Collection Time: 09/26/2017 10:39 AM  Result Value Ref Range Status   Specimen Description ABSCESS LEFT HEEL  Final   Special Requests SPEC C PATIENT ON FOLLOWING DAPTOMYCIN  Final   Gram Stain   Final    RARE WBC PRESENT, PREDOMINANTLY PMN FEW GRAM POSITIVE COCCI IN  PAIRS Performed at [facility], [place] [address] [place], [place] [address]    Culture   Final    FEW STAPHYLOCOCCUS AUREUS NO ANAEROBES ISOLATED; CULTURE IN PROGRESS FOR 5 DAYS    Report Status PENDING  Incomplete   Organism ID, Bacteria STAPHYLOCOCCUS AUREUS  Final      Susceptibility   Staphylococcus aureus - MIC*    CIPROFLOXACIN <=0.5 SENSITIVE Sensitive     ERYTHROMYCIN <=0.25 SENSITIVE Sensitive     GENTAMICIN <=0.5 SENSITIVE Sensitive     OXACILLIN 0.5 SENSITIVE Sensitive     TETRACYCLINE <=1 SENSITIVE Sensitive     VANCOMYCIN 1 SENSITIVE Sensitive     TRIMETH/SULFA <=10 SENSITIVE Sensitive     CLINDAMYCIN <=0.25 SENSITIVE Sensitive     RIFAMPIN <=0.5 SENSITIVE Sensitive     Inducible Clindamycin NEGATIVE Sensitive     * FEW STAPHYLOCOCCUS AUREUS  Aerobic Culture (superficial specimen)     Status: None   Collection Time: 09/08/2017 10:43 AM  Result Value Ref Range Status   Specimen Description WOUND LEFT ANKLE PUS  Final   Special Requests SPEC D PATIENT ON FOLLOWING DAPTOMYCIN  Final   Gram Stain   Final    RARE WBC PRESENT, PREDOMINANTLY PMN FEW GRAM POSITIVE COCCI IN PAIRS Performed at [facility], [address] [place], [place] [address]    Culture MODERATE STAPHYLOCOCCUS AUREUS  Final   Report Status 09/21/2017 FINAL  Final   Organism ID, Bacteria STAPHYLOCOCCUS AUREUS  Final      Susceptibility   Staphylococcus aureus - MIC*    CIPROFLOXACIN <=0.5 SENSITIVE Sensitive     ERYTHROMYCIN <=0.25 SENSITIVE Sensitive     GENTAMICIN <=0.5 SENSITIVE Sensitive     OXACILLIN 0.5  SENSITIVE Sensitive     TETRACYCLINE <=1 SENSITIVE Sensitive     VANCOMYCIN 1 SENSITIVE Sensitive     TRIMETH/SULFA <=10 SENSITIVE Sensitive     CLINDAMYCIN <=0.25 SENSITIVE Sensitive     RIFAMPIN <=0.5 SENSITIVE Sensitive     Inducible Clindamycin NEGATIVE Sensitive     * MODERATE STAPHYLOCOCCUS AUREUS  Anaerobic culture     Status: None (Preliminary result)   Collection Time: 09/13/2017 10:43 AM  Result Value Ref Range Status   Specimen Description WOUND LEFT ANKLE PUS  Final   Special Requests   Final    SPEC D PATIENT ON FOLLOWING DAPTOMYCIN Performed at [facility], [place] [address], [place], [place] [address]    Culture   Final    NO ANAEROBES ISOLATED; CULTURE IN PROGRESS FOR 5 DAYS   Report Status PENDING  Incomplete  Aerobic Culture (superficial specimen)     Status: None   Collection Time: 10/05/2017 10:44 AM  Result Value Ref Range Status   Specimen Description WOUND LEFT ANKLE DEEP PUS  Final   Special Requests SPEC E PATIENT ON FOLLOWING DAPTOMYCIN  Final   Gram Stain   Final    RARE WBC PRESENT, PREDOMINANTLY PMN RARE GRAM POSITIVE COCCI IN PAIRS RARE GRAM NEGATIVE RODS Performed at [facility], [place] [address] [place],  [address]    Culture FEW STAPHYLOCOCCUS AUREUS  Final   Report Status 09/21/2017 FINAL  Final   Organism ID, Bacteria STAPHYLOCOCCUS AUREUS  Final      Susceptibility   Staphylococcus aureus - MIC*    CIPROFLOXACIN <=0.5 SENSITIVE Sensitive     ERYTHROMYCIN <=0.25 SENSITIVE Sensitive     GENTAMICIN <=0.5 SENSITIVE Sensitive     OXACILLIN 0.5 SENSITIVE  Sensitive     TETRACYCLINE <=1 SENSITIVE Sensitive     VANCOMYCIN 1 SENSITIVE Sensitive     TRIMETH/SULFA <=10 SENSITIVE Sensitive     CLINDAMYCIN <=0.25 SENSITIVE Sensitive     RIFAMPIN <=0.5 SENSITIVE Sensitive     Inducible Clindamycin NEGATIVE Sensitive     * FEW STAPHYLOCOCCUS AUREUS  Anaerobic culture     Status: None (Preliminary result)   Collection Time: 09/24/2017  10:44 AM  Result Value Ref Range Status   Specimen Description WOUND LEFT ANKLE DEEP PUS  Final   Special Requests   Final    SPEC E PATIENT ON FOLLOWING DAPTOMYCIN Performed at [facility], [place] [address], [place], [place] [address]    Culture   Final    NO ANAEROBES ISOLATED; CULTURE IN PROGRESS FOR 5 DAYS   Report Status PENDING  Incomplete  Aerobic/Anaerobic Culture (surgical/deep wound)     Status: None (Preliminary result)   Collection Time: 09/15/2017 10:53 AM  Result Value Ref Range Status   Specimen Description ABSCESS RIGHT ELBOW DEEP PUS  Final   Special Requests SPEC F PATIENT ON FOLLOWING DAPTOMYCIN  Final   Gram Stain   Final    RARE WBC PRESENT, PREDOMINANTLY PMN ABUNDANT GRAM POSITIVE COCCI Performed at [facility], [place] [address] [place], [place] [address]    Culture   Final    FEW STAPHYLOCOCCUS AUREUS NO ANAEROBES ISOLATED; CULTURE IN PROGRESS FOR 5 DAYS    Report Status PENDING  Incomplete   Organism ID, Bacteria STAPHYLOCOCCUS AUREUS  Final      Susceptibility   Staphylococcus aureus - MIC*    CIPROFLOXACIN <=0.5 SENSITIVE Sensitive     ERYTHROMYCIN <=0.25 SENSITIVE Sensitive     GENTAMICIN <=0.5 SENSITIVE Sensitive     OXACILLIN 0.5 SENSITIVE Sensitive     TETRACYCLINE <=1 SENSITIVE Sensitive     VANCOMYCIN 1 SENSITIVE Sensitive     TRIMETH/SULFA <=10 SENSITIVE Sensitive     CLINDAMYCIN <=0.25 SENSITIVE Sensitive     RIFAMPIN <=0.5 SENSITIVE Sensitive     Inducible Clindamycin NEGATIVE Sensitive     * FEW STAPHYLOCOCCUS AUREUS    Impression/Plan:  1. Disseminated MSSA bacteremia - on appropriate antibiotics.    2.  Medication monitoring - platelets remain wnl.   3.  Increased CK - improving level, down to 793 today.  Dr. Tommy Medal back tomorrow.

## 2017-09-21 NOTE — Progress Notes (Signed)
Discussed limited IV access with pharmacist, advised to hold next dose of cipro until other IV access could be established.

## 2017-09-21 NOTE — Progress Notes (Addendum)
Patient ID: Jessica Howard, female   DOB: August 14, 1960, 57 y.o.   MRN: [id number]     Subjective:  Patient intubated no family at the bedside   Objective:   VITALS:   Vitals:   09/21/17 0700 09/21/17 0728 09/21/17 0750 09/21/17 1156  BP: 126/79     Pulse: 85     Resp: (!) 22     Temp:  98.1 F (36.7 C)    TempSrc:  Oral    SpO2: 100%  100% 100%  Weight:      Height:        ABD soft Incision: dressing C/D/I and no drainage Right hand and elbow dressing no drainage Left upper ext stump and elbow no drainage right tibial stump dressing no drainage Left ankle dressing no drainage and zero gravity boot in place   Lab Results  Component Value Date   WBC 10.0 09/21/2017   HGB 8.6 (L) 09/21/2017   HCT 26.6 (L) 09/21/2017   MCV 83.1 09/21/2017   PLT 194 09/21/2017   BMET    Component Value Date/Time   NA 130 (L) 09/21/2017 0503   NA 142 03/06/2017 0929   K 3.9 09/21/2017 0503   K 4.1 03/06/2017 0929   CL 99 (L) 09/21/2017 0503   CL 101 02/13/2012 0850   CO2 23 09/21/2017 0503   CO2 23 03/06/2017 0929   GLUCOSE 126 (H) 09/21/2017 0503   GLUCOSE 129 03/06/2017 0929   GLUCOSE 529 (H) 02/13/2012 0850   BUN 44 (H) 09/21/2017 0503   BUN 17.1 03/06/2017 0929   CREATININE 2.78 (H) 09/21/2017 0503   CREATININE 0.8 03/06/2017 0929   CALCIUM 7.3 (L) 09/21/2017 0503   CALCIUM 9.6 03/06/2017 0929   GFRNONAA 18 (L) 09/21/2017 0503   GFRAA 21 (L) 09/21/2017 0503     Assessment/Plan: 1 Day Post-Op   Principal Problem:   MSSA bacteremia Active Problems:   Thalassemia   Rheumatoid arthritis ([place])   Morbid obesity (HCC)   Acute back pain   Essential hypertension   Diabetes ([place])   Dyslipidemia   Severe sepsis with septic shock (HCC)   AKI (acute kidney injury) ([place])   Immunosuppression due to drug therapy   Rheumatoid arthritis(714.0)   Septic joint of left wrist ([place])   Osteomyelitis of great toe of right foot ([place])   Hypertension   Hyperlipidemia   Acute  hearing loss of left ear   Diabetes mellitus, insulin dependent (IDDM), controlled (HCC)   Beta thalassemia (HCC)   Acute anemia   Elevated liver enzymes   Hyperbilirubinemia   Altered mental status, unspecified   Atrial flutter (HCC)   Infection of right hand   Abscess   Acute renal failure (HCC)   Acute respiratory failure (HCC)   Encephalopathy   Endotracheally intubated   Staphylococcal arthritis of right wrist (HCC)   Facet joint disease   Pleural effusion   Left wrist pain   Goals of care, counseling/discussion   Palliative care by specialist   History of amputation of right lower extremity through tibia and fibula ([place])   Septic olecranon bursitis of left elbow   Septic olecranon bursitis of right elbow   Continue plan per medicine Will continue to follow Not planning any further I&D at this time     [name] 09/21/2017, 12:03 PM  Discussed and agree with above.   [name], MD Cell [phone]

## 2017-09-21 NOTE — Progress Notes (Addendum)
PULMONARY  / CRITICAL CARE MEDICINE  Name: Jessica Howard MRN: [id number] DOB: 06-Aug-1960    LOS: 62  REFERRING MD :  Velvet Bathe MD  CHIEF COMPLAINT:  Back Pain   BRIEF PATIENT DESCRIPTION: Patient is a 57 y.o female with RA on immunosuppressive therapy (Levunomide and Abatacept) and DM who presented to the ED with 2 weeks of progressive myalgias and arthralgias. Initial work-up was significant for AKI, HAGMA, elevated liver enzymes, lactic acidosis, leukocytosis, and acute on chronic anemia. Blood cultures subsequently grew MSSA (4/4). Initial TTE was negative for features of endocarditis; however, patient has pathologic PE findings including splinter hemorrhages and Olser nodes. Her renal function worsened over the course of her hospitalization and she is requiring CRRT. The patient also converted to atrial fibrillation and was placed on amiodarone, diltiazem, and lopressor. MRI of the right foot illustrated septic arthritis of the right first IP with associated osteomyelitis. MRI of the lumbar spine showed septic arthritis of the right L1-2 facet joint with associated fluid collection. The patient went for I&D on 4/4 and we obtained a TEE that could not exclude endocarditis. She was successfully cardioverted at that time. Since has gone back for another I&D on 4/7, right BKA on 4/9, left UE amputation on 4/11, and I&D on 4/13. She remains critical with a poor prognosis.  LINES / TUBES: Right IJ tunneled HD catheter 3/31 Urinary catheter [place] rain right hand  Endotracheal tube 4/4 NG tube   CULTURES: - 4/4 blood cultures on 3/29 for MSSA - Urine cultures on 3/29 growing MSSA - 1/2 Blood cultures on 3/30 with MSSA - Right toe superficial wound culture with staph aureus  - Left wrist synovial fluid with gram positive cocci  - Blood Cultures 4/4 with 1/2 positive - Blood cultures 4/9 NGTD   ANTIBIOTICS: - Vancomycin 3/29 -> discontinued on 3/30  - Aztreonam 3/29 -> discontinued  on 3/30  - Flagyl 3/29 -> discontinued on 3/30  - Daptomycin 3/30 -> discontinued 4/13  - Linezolid 4/13 -> Current  - Ciprofloxacin 4/13 -> Current   SIGNIFICANT EVENTS: Admitted for MSSA bacteremia 3/29 >>> Hypotensive and started on CRRT 3/31 >>> Converted to Atrial Fibrillation 4/1 >>> I&D of right wrist, right foot, and left wrist 4/4 >>> TEE with cardioversion 4/4 >>> Repeat blood cultures 1 of 2 positive on 4/4 >>> CRRT stopped 4/4 >>> Hypotension requiring pressors (neo) 4/4 >>> Left on Vent 4/4 >>> IR drainage of right L1-2 facet abscess 4/5 >>> CRRT restarted 4/5 >>> CRRT stopped 4/7 >>> I&D of right wrist, right foot, and left wrist. Amputation of right 1st ray and right second digit at DIP 4/7 >>> Hypotensive requiring pressor support with Neo 4/8 >>> Right BKA 4/9 >>> I&D with left UE amputation 4/11 >>> Repeat I&D of the left foot, right hand, and left elbow 4/13 >>> Restarted CRRT 4/13 >>>  INTERVAL HISTORY:  No major events overnight.  Off pressors.  With initiation of CRRT need additional line access for Abx. Son did not consent last night.  No family at bedside.  Continue goals of care discussion with the family. Palliative care consulted.   VITAL SIGNS: Temp:  [97.6 F (36.4 C)-101.8 F (38.8 C)] 97.7 F (36.5 C) (04/14 0422) Pulse Rate:  [76-90] 77 (04/14 0600) Resp:  [0-26] 19 (04/14 0600) BP: (94-200)/(36-96) 117/63 (04/14 0600) SpO2:  [100 %] 100 % (04/14 0600) FiO2 (%):  [40 %] 40 % (04/14 0430) Weight:  [293 lb 6.9 oz (133.1  kg)] 293 lb 6.9 oz (133.1 kg) (04/14 0400)  INTAKE / OUTPUT: Intake/Output      04/13 0701 - 04/14 0700   I.V. (mL/kg) 1725.2 (13)   Blood 630   NG/GT 580   IV Piggyback 500   Total Intake(mL/kg) 3435.2 (25.8)   Urine (mL/kg/hr) 310 (0.1)   Emesis/NG output 50   Other 1753   Blood 50   Total Output 2163   Net +1272.2         PHYSICAL EXAMINATION:  General: Obese female, sedated  Neuro: Sedated but opens  eyes to stimuli, does not track  HEENT: Normocephalic, atraumatic, moist mucus membranes Cardiovascular: RRR, no murmurs, no rubs Lungs: Diminished bibasilar breath sounds, rhonchi  Abdomen: Active bowel sounds, soft, no tenderness to palpation  Musculoskeletal: Mild LLE edema, bandages are intact and dry  Skin: Warm and dry   LABS: Cbc Recent Labs  Lab 09/19/17 0514 10/04/2017 0528 09/10/2017 1646 09/21/17 0503  WBC 14.0* 10.8*  --  10.0  HGB 7.9* 6.9* 8.9* 8.6*  HCT 24.5* 21.3* 27.2* 26.6*  PLT 197 215  --  194   Chemistry Recent Labs  Lab 09/19/17 0514 10/05/2017 0528 09/27/2017 1646 09/21/17 0503  NA 130* 128* 129* 130*  K 4.2 5.7* 3.7 3.9  CL 96* 96* 98* 99*  CO2 24 23 21* 23  BUN 50* 66* 62* 44*  CREATININE 3.43* 4.17* 3.96* 2.78*  CALCIUM 7.5* 7.3* 7.3* 7.3*  MG 1.9 2.0  --  1.9  PHOS 4.4 3.9 4.1 3.3  GLUCOSE 123* 96 100* 126*   Liver fxn Recent Labs  Lab 09/19/17 0514 09/08/2017 0528 09/24/2017 1646 09/21/17 0503  AST 88* 105*  --  83*  ALT 26 26  --  32  ALKPHOS 416* 402*  --  405*  BILITOT 5.0* 5.4*  --  6.1*  PROT 5.4* 5.1*  --  5.5*  ALBUMIN 1.2* <1.0*  <1.0* 1.1* 1.1*  1.1*   coags No results for input(s): APTT, INR in the last 168 hours.   Sepsis markers No results for input(s): LATICACIDVEN, PROCALCITON in the last 168 hours.   Cardiac markers Recent Labs  Lab 09/17/17 0540 09/19/17 0514 09/21/17 0503  CKTOTAL 439* 1,911* 793*   BNP No results for input(s): PROBNP in the last 168 hours.   ABG Recent Labs  Lab 09/15/17 0715 09/19/17 0521 09/30/2017 0245  PHART  --   --  7.320*  PCO2ART  --   --  48.2*  PO2ART  --   --  145.0*  HCO3 22.0 28.1* 24.4  TCO2 23 30 26    CBG trend Recent Labs  Lab 09/19/2017 1309 09/30/2017 1600 10/03/2017 1920 09/21/17 0018 09/21/17 0421  GLUCAP 84 96 94 111* 103*   DIAGNOSES: Principal Problem:   MSSA bacteremia Active Problems:   Thalassemia   Rheumatoid arthritis ([place])   Morbid obesity ([place])    Acute back pain   Essential hypertension   Diabetes ([place])   Dyslipidemia   Severe sepsis with septic shock ([place])   AKI (acute kidney injury) ([place])   Immunosuppression due to drug therapy   Rheumatoid arthritis(714.0)   Septic joint of left wrist ([place])   Osteomyelitis of great toe of right foot ([place])   Hypertension   Hyperlipidemia   Acute hearing loss of left ear   Diabetes mellitus, insulin dependent (IDDM), controlled (HCC)   Beta thalassemia (HCC)   Acute anemia   Elevated liver enzymes   Hyperbilirubinemia   Altered mental  status, unspecified   Atrial flutter ([place])   Infection of right hand   Abscess   Acute renal failure (HCC)   Acute respiratory failure (HCC)   Encephalopathy   Endotracheally intubated   Staphylococcal arthritis of right wrist (HCC)   Facet joint disease   Pleural effusion   Left wrist pain   Goals of care, counseling/discussion   Palliative care by specialist   History of amputation of right lower extremity through tibia and fibula (HCC)   Septic olecranon bursitis of left elbow   Septic olecranon bursitis of right elbow  ASSESSMENT / PLAN: 57 y.o female with RA on immunosuppressive therapy (Levunomide and Abatacept) who presented to the ED on 3/29 with sepsis and multiorgan failure secondary to MSSA Bacteremia/Endocaridits.   Continuing GOC discussion with the family. Appreciate palliative cares assistance with the process.   INFECTIOUS A: Sepsis secondary to MSSA Bacteremia/Endocarditis  MSSA septic arthritis, spinal abscess S/p right BKA on 4/9.  S/p left UE below the elbow amputation 4/11 Blood culutres from 4/9 with NGTD Off pressors  P:   Appreciate ID's help, continuing Linezolid and Ciprofloxacin  Appreciate orthopedics help.  Needs line holiday, but needs additional line access with initiation of CRRT.   ADDENDUM: Spoke with the son this AM about additional line access. He continues to state he does not want any additional  procedures. He is thinking more about comfort care. He would like [place] to talk to nephrology to discuss stopping CRRT to run antibiotics, then resuming CRRT post antibiotics.   CARDIOVASCULAR A:  Hypotension Patient is hypertensive at baseline on bystolic, irbesartan, and spironolactone.  Atrial Fib 2/2 sepsis  Cardioverted on 4/4.  On amiodarone   P:  Cardiology signed off 4/8.  Currently in sinus rhythm. Continue Amiodarone. Continue heparin.   RENAL A: ARF secondary to MSSA bacteremia Hyponatremia  Restarted CRRT on 4/13  P:   Appreciate nephrology's help.  Patient remains oliguric, with approximately 20 mL/hr out overnight.   PULMONARY A: Post surgical ventilation  Obstructive Sleep Apnea  Vent Mode: PRVC FiO2 (%):  [40 %] 40 % Set Rate:  [22 bmp] 22 bmp Vt Set:  [470 mL] 470 mL PEEP:  [5 cmH20] 5 cmH20 Plateau Pressure:  [21 cmH20-26 cmH20] 21 cmH20   Intubate on 4/4, day #10. Family does not want trach or re-intubation and extubation.   P:   RASS: -2 Current sedation: Fentanyl  CXR with improvement pulmonary vascular congestion but still present.  VAP precautions Will wean sedation and vent to work towards extubation.   GASTROINTESTINAL A: Elevated LFTs ? Cholestatic liver injury  Nutrition  GI ppx  P:   Will continue to monitor LFTs  Continue GI ppx  Continue tube feeds.  HEMATOLOGIC A:  Beta Thalassemia  Acute on chronic anemia secondary to sepsis  P:  PRBC for hgb </= 6.9gm%               - exceptions are                         -  if ACS susepcted/confirmed then transfuse for hgb </= 8.0gm%,  or                          -  active bleeding with hemodynamic instability, then transfuse regardless of hemoglobin value              At at all times  try to transfuse 1 unit prbc as possible with exception of active hemorrhage  ENDOCRINE A: Insulin Dependent Type 2 DM On Humolin 75 units TID at home   P:   CBG goal 140-180  Continue  SSI-moderate q 4hours  NEUROLOGICAL A: Altered Mental Status  Multiple contributing factors including A-fib, sepsis, opiates CT head and ammonium WNL  P:  Wean sedation.   Pulmonary and [place] Pager: [phone]  09/21/2017, 6:59 AM

## 2017-09-21 NOTE — Progress Notes (Signed)
Heparin gtt stopped at 0830 per Ina Homes, MD.

## 2017-09-22 ENCOUNTER — Inpatient Hospital Stay (HOSPITAL_COMMUNITY): Payer: Medicare Other

## 2017-09-22 ENCOUNTER — Encounter (HOSPITAL_COMMUNITY): Payer: Self-pay | Admitting: Orthopedic Surgery

## 2017-09-22 ENCOUNTER — Other Ambulatory Visit: Payer: Self-pay | Admitting: *Deleted

## 2017-09-22 DIAGNOSIS — Z515 Encounter for palliative care: Secondary | ICD-10-CM

## 2017-09-22 DIAGNOSIS — Z66 Do not resuscitate: Secondary | ICD-10-CM

## 2017-09-22 DIAGNOSIS — G9340 Encephalopathy, unspecified: Secondary | ICD-10-CM

## 2017-09-22 LAB — RENAL FUNCTION PANEL
ALBUMIN: 1.3 g/dL — AB (ref 3.5–5.0)
ANION GAP: 9 (ref 5–15)
BUN: 29 mg/dL — ABNORMAL HIGH (ref 6–20)
CALCIUM: 7.9 mg/dL — AB (ref 8.9–10.3)
CO2: 24 mmol/L (ref 22–32)
Chloride: 99 mmol/L — ABNORMAL LOW (ref 101–111)
Creatinine, Ser: 1.9 mg/dL — ABNORMAL HIGH (ref 0.44–1.00)
GFR calc non Af Amer: 29 mL/min — ABNORMAL LOW (ref >60)
GFR, EST AFRICAN AMERICAN: 33 mL/min — AB (ref >60)
GLUCOSE: 202 mg/dL — AB (ref 65–99)
PHOSPHORUS: 2.6 mg/dL (ref 2.5–4.6)
Potassium: 4 mmol/L (ref 3.5–5.1)
SODIUM: 132 mmol/L — AB (ref 135–145)

## 2017-09-22 LAB — MAGNESIUM: Magnesium: 2.1 mg/dL (ref 1.7–2.4)

## 2017-09-22 LAB — GLUCOSE, CAPILLARY
GLUCOSE-CAPILLARY: 199 mg/dL — AB (ref 65–99)
GLUCOSE-CAPILLARY: 160 mg/dL — AB (ref 65–99)
GLUCOSE-CAPILLARY: 159 mg/dL — AB (ref 65–99)
Glucose-Capillary: 181 mg/dL — ABNORMAL HIGH (ref 65–99)
Glucose-Capillary: 173 mg/dL — ABNORMAL HIGH (ref 65–99)

## 2017-09-22 LAB — CBC
HCT: 27.8 % — ABNORMAL LOW (ref 36.0–46.0)
Hemoglobin: 9 g/dL — ABNORMAL LOW (ref 12.0–15.0)
MCH: 27.4 pg (ref 26.0–34.0)
MCHC: 32.4 g/dL (ref 30.0–36.0)
MCV: 84.5 fL (ref 78.0–100.0)
PLATELETS: 186 10*3/uL (ref 150–400)
RBC: 3.29 MIL/uL — ABNORMAL LOW (ref 3.87–5.11)
RDW: 19.9 % — ABNORMAL HIGH (ref 11.5–15.5)
WBC: 10 10*3/uL (ref 4.0–10.5)

## 2017-09-22 LAB — HEPATIC FUNCTION PANEL
ALBUMIN: 1.3 g/dL — AB (ref 3.5–5.0)
ALT: 42 U/L (ref 14–54)
AST: 85 U/L — ABNORMAL HIGH (ref 15–41)
Alkaline Phosphatase: 477 U/L — ABNORMAL HIGH (ref 38–126)
BILIRUBIN INDIRECT: 2.1 mg/dL — AB (ref 0.3–0.9)
BILIRUBIN TOTAL: 6.9 mg/dL — AB (ref 0.3–1.2)
Bilirubin, Direct: 4.8 mg/dL — ABNORMAL HIGH (ref 0.1–0.5)
TOTAL PROTEIN: 6.2 g/dL — AB (ref 6.5–8.1)

## 2017-09-22 MED ORDER — GLYCOPYRROLATE 0.2 MG/ML IJ SOLN: 0.2000 mg | INTRAMUSCULAR | Status: DC | PRN

## 2017-09-22 MED ORDER — FENTANYL CITRATE (PF) 100 MCG/2ML IJ SOLN: 100.0000 ug | INTRAMUSCULAR | Status: DC | PRN

## 2017-09-22 MED ORDER — MORPHINE BOLUS VIA INFUSION
2.0000 mg | INTRAVENOUS | Status: DC | PRN
  Filled 2017-09-22: qty 2

## 2017-09-22 MED ORDER — ACETYLCYSTEINE 20 % IN SOLN
4.0000 mL | Freq: Two times a day (BID) | RESPIRATORY_TRACT | Status: DC
  Filled 2017-09-22 (×2): qty 4

## 2017-09-22 MED ORDER — MORPHINE 100MG IN NS 100ML (1MG/ML) PREMIX INFUSION
10.0000 mg/h | INTRAVENOUS | Status: DC
  Administered 2017-09-22: 5 mg/h via INTRAVENOUS
  Filled 2017-09-22: qty 100

## 2017-09-22 MED ORDER — BIOTENE DRY MOUTH MT LIQD: 15.0000 mL | OROMUCOSAL | Status: DC | PRN

## 2017-09-22 MED ORDER — ORAL CARE MOUTH RINSE: 15.0000 mL | Freq: Two times a day (BID) | OROMUCOSAL | Status: DC

## 2017-09-22 MED ORDER — POLYVINYL ALCOHOL 1.4 % OP SOLN
1.0000 [drp] | Freq: Four times a day (QID) | OPHTHALMIC | Status: DC | PRN
  Filled 2017-09-22: qty 15

## 2017-09-22 MED ORDER — BISACODYL 10 MG RE SUPP
10.0000 mg | Freq: Once | RECTAL | Status: AC
  Administered 2017-09-22: 10 mg via RECTAL
  Filled 2017-09-22: qty 1

## 2017-09-22 MED ORDER — GLYCOPYRROLATE 1 MG PO TABS: 1.0000 mg | ORAL_TABLET | ORAL | Status: DC | PRN

## 2017-09-22 MED ORDER — ALBUTEROL SULFATE (2.5 MG/3ML) 0.083% IN NEBU
2.5000 mg | INHALATION_SOLUTION | Freq: Four times a day (QID) | RESPIRATORY_TRACT | Status: DC
  Administered 2017-09-22 (×2): 2.5 mg via RESPIRATORY_TRACT
  Filled 2017-09-22 (×3): qty 3

## 2017-09-22 MED ORDER — HEPARIN SODIUM (PORCINE) 1000 UNIT/ML DIALYSIS
1000.0000 [IU] | INTRAMUSCULAR | Status: DC | PRN
  Administered 2017-09-22: 2800 [IU] via INTRAVENOUS_CENTRAL
  Filled 2017-09-22: qty 3
  Filled 2017-09-22 (×2): qty 6

## 2017-09-22 MED ORDER — SENNOSIDES 8.8 MG/5ML PO SYRP
5.0000 mL | ORAL_SOLUTION | Freq: Two times a day (BID) | ORAL | Status: DC
  Filled 2017-09-22: qty 5

## 2017-09-22 MED ORDER — GLYCOPYRROLATE 0.2 MG/ML IJ SOLN
0.2000 mg | INTRAMUSCULAR | Status: DC | PRN
  Administered 2017-09-22: 0.2 mg via SUBCUTANEOUS
  Filled 2017-09-22: qty 1

## 2017-09-22 MED ORDER — HALOPERIDOL LACTATE 5 MG/ML IJ SOLN
0.5000 mg | INTRAMUSCULAR | Status: DC | PRN
  Administered 2017-09-23: 0.5 mg via INTRAVENOUS
  Filled 2017-09-22: qty 1

## 2017-09-22 MED ORDER — HALOPERIDOL LACTATE 2 MG/ML PO CONC
0.5000 mg | ORAL | Status: DC | PRN
  Filled 2017-09-22: qty 0.3

## 2017-09-22 MED ORDER — FENTANYL BOLUS VIA INFUSION: 100.0000 ug | INTRAVENOUS | Status: DC | PRN

## 2017-09-22 MED ORDER — HALOPERIDOL 0.5 MG PO TABS: 0.5000 mg | ORAL_TABLET | ORAL | Status: DC | PRN

## 2017-09-22 NOTE — Progress Notes (Signed)
CRRT filter clotted . Patient's son stated that he did not want CRRT restarted. Dr.Helberg informed and Dr Hollie Salk was also informed.

## 2017-09-22 NOTE — Progress Notes (Signed)
Spoke with family and they would like to move forward with one-way extubation. Patient is currently off sedation and resting comfortably. We discussed the possible outcomes of extubation. Family reiterates that they do not want the patient re-intubated if she deteriorates. We have a fentanyl drip at bedside and if she is not tolerating extubation we will start the drip for comfort care.

## 2017-09-22 NOTE — Progress Notes (Signed)
Went back by to talk the patient's son, [place]. He is concerned that the staph is taking over her body. Concerned that she is suffering. He would like to try for a 1-way extubation. We discussed weaning the fentanyl and using fentanyl boluses to help with her pain. We will then attempt a SBT and see if she is ready for extubation. He would also like to see her surgical sites.   Antonio re-iterates that he would like to being working towards 1-way extubation.

## 2017-09-22 NOTE — Progress Notes (Signed)
Daily Progress Note   Patient Name: Jessica Howard       Date: 09/22/2017 DOB: Apr 18, 1961  Age: 57 y.o. MRN#: [id number] Attending Physician: Brand Males, MD Primary Care Physician: Merrilee Seashore, MD Admit Date: 08/13/2017  Reason for Consultation/Follow-up: Establishing goals of care, Psychosocial/spiritual support and Terminal Care  Subjective/GOC:   Follow-up palliative visit. Family members met with Romona Curls, NP throughout the weekend and plan is for one-extubation this afternoon.   Introduced myself to son [facility]) and daughter Corliss Marcus) at bedside this afternoon. Aniyah is tearful and does not participate in conversation. Antonio speaks of plan for one-way extubation soon. He speaks of his mother being "gone" for 10 days now. He is irritable and speaks of his frustrations that it is 2019 and she is dying from an infection. Continuously states "overwhelming infection that was misdiagnosed." I offered therapeutic listening and emotional support while Antonio spoke. He appears to be grieving and processing the thought of losing his mother "my best friend."   Antonio speaks of his mother being a "woman of strong faith" and "an angel on earth." Spiritual support provided. Stayed with family as Antonio sang to her.  Addendum: Visited with patient and family shortly after extubation. Sister now at bedside. Patient extubated and comfortable on nasal cannula. I attempted to discuss palliative plan of care including symptom management medications as needed if she shows s/s of discomfort or struggling to breath. Antonio tells me "no medication" and speaks of not wanting to "fuel the host" (the infection). He speaks of this being out of our control but God's control. That again she has  been "gone" for 10 days and at this point her "body is a vessel." Emotional/spiritual support again provided. PMT contact information given. Antonio kindly asks me to leave the room while they pray.    Length of Stay: 17  Current Medications: Scheduled Meds:  . acetylcysteine  4 mL Nebulization BID  . albuterol  2.5 mg Nebulization Q6H  . amiodarone  200 mg Oral Daily  . chlorhexidine gluconate (MEDLINE KIT)  15 mL Mouth Rinse BID  . Chlorhexidine Gluconate Cloth  6 each Topical Daily  . docusate  100 mg Per Tube BID  . insulin aspart  0-15 Units Subcutaneous Q4H  . lactulose  10 g Oral Daily  . mouth rinse  15 mL Mouth Rinse 10 times per day  . pantoprazole sodium  40 mg Per Tube Daily  . sennosides  5 mL Per Tube BID  . sodium chloride flush  3 mL Intravenous Q12H    Continuous Infusions: . sodium chloride Stopped (09/21/17 0500)  . ciprofloxacin Stopped (09/22/17 0601)  . feeding supplement (VITAL HIGH PROTEIN) 1,000 mL (09/22/17 0456)  . fentaNYL infusion INTRAVENOUS 100 mcg/hr (09/22/17 0900)  . heparin 999 mL/hr at 09/22/17 0608  . linezolid (ZYVOX) IV Stopped (09/22/17 0998)  . dialysis replacement fluid (prismasate) 400 mL/hr at 09/22/17 0443  . dialysis replacement fluid (prismasate) 200 mL/hr at 09/21/17 2302  . dialysate (PRISMASATE) 2,000 mL/hr at 09/22/17 1140    PRN Meds: acetaminophen **OR** acetaminophen, fentaNYL, heparin, LORazepam, ondansetron **OR** ondansetron (ZOFRAN) IV  Physical Exam  Constitutional: She appears lethargic. She appears ill.  HENT:  Head: Normocephalic and atraumatic.  Cardiovascular: Normal rate.  Pulmonary/Chest: No accessory muscle usage. No tachypnea. No respiratory distress.  Extubated to nasal cannula.   Genitourinary:  Genitourinary Comments: Foley  Neurological: She appears lethargic.  Minimally responsive  Skin: Skin is warm and dry.  Psychiatric:  No overt agitation otherwise unable to test  Nursing note and vitals  reviewed.          Vital Signs: BP (!) 154/70   Pulse 99   Temp (!) 96.8 F (36 C) (Axillary)   Resp 19   Ht _0  (1.575 m)   Wt 133.6 kg (294 lb 8.6 oz)   LMP 12/27/2013 Comment: irreggular  SpO2 100%   BMI 53.87 kg/m  SpO2: SpO2: 100 % O2 Device: O2 Device: Ventilator O2 Flow Rate: O2 Flow Rate (L/min): 3 L/min  Intake/output summary:   Intake/Output Summary (Last 24 hours) at 09/22/2017 1415 Last data filed at 09/22/2017 1400 Gross per 24 hour  Intake 3025 ml  Output 5127 ml  Net -2102 ml   LBM: Last BM Date: 10/03/2017 Baseline Weight: Weight: 117.9 kg (260 lb) Most recent weight: Weight: 133.6 kg (294 lb 8.6 oz)       Palliative Assessment/Data: PPS 20%    Flowsheet Rows     Most Recent Value  Intake Tab  Referral Department  Critical care  Unit at Time of Referral  ICU  Palliative Care Primary Diagnosis  Sepsis/Infectious Disease  Date Notified  10/06/2017  Palliative Care Type  New Palliative care  Reason for referral  Clarify Goals of Care  Date of Admission  10/06/17  Date first seen by Palliative Care  09/19/17  # of days Palliative referral response time  1 Day(s)  # of days IP prior to Palliative referral  -18  Clinical Assessment  Palliative Performance Scale Score  20%  Pain Max last 24 hours  Not able to report  Pain Min Last 24 hours  Not able to report  Dyspnea Max Last 24 Hours  Not able to report  Dyspnea Min Last 24 hours  Not able to report  Nausea Max Last 24 Hours  Not able to report  Nausea Min Last 24 Hours  Not able to report  Anxiety Max Last 24 Hours  Not able to report  Anxiety Min Last 24 Hours  Not able to report  Other Max Last 24 Hours  Not able to report  Psychosocial & Spiritual Assessment  Palliative Care Outcomes  Patient/Family meeting held?  Yes  Who was at the meeting?  son  Palliative Care follow-up planned  Yes, Facility  Patient Active Problem List   Diagnosis Date Noted  . Palliative care by specialist     . History of amputation of right lower extremity through tibia and fibula ([place])   . Septic olecranon bursitis of left elbow   . Septic olecranon bursitis of right elbow   . Left wrist pain   . Goals of care, counseling/discussion   . Acute respiratory failure ([place])   . Encephalopathy   . Endotracheally intubated   . Staphylococcal arthritis of right wrist ([place])   . Facet joint disease   . Pleural effusion   . Abscess   . Acute renal failure ([place])   . Infection of right hand 10/04/2017  . Altered mental status, unspecified   . Atrial flutter ([place])   . MSSA bacteremia 09/06/2017  . Elevated liver enzymes 09/06/2017  . Hyperbilirubinemia 09/06/2017  . Severe sepsis with septic shock ([place]) 09/04/2017  . AKI (acute kidney injury) ([place]) 08/29/2017  . Immunosuppression due to drug therapy 08/15/2017  . Rheumatoid arthritis(714.0) 08/31/2017  . Septic joint of left wrist ([place]) 08/10/2017  . Osteomyelitis of great toe of right foot ([place]) 08/19/2017  . Hypertension 08/11/2017  . Hyperlipidemia 08/20/2017  . Acute hearing loss of left ear 08/21/2017  . Diabetes mellitus, insulin dependent (IDDM), controlled ([place]) 08/13/2017  . Beta thalassemia ([place]) 08/26/2017  . Acute anemia 08/08/2017  . Elevated C-reactive protein (CRP)   . Elevated erythrocyte sedimentation rate   . Leukocytosis   . Hip joint effusion 01/20/2016  . Right hip pain 01/19/2016  . Hip pain 01/19/2016  . Congestive heart failure (CHF) ([place]) 12/01/2015  . Anemia 12/01/2015  . CHF (congestive heart failure) ([place]) 12/01/2015  . Acute diastolic CHF (congestive heart failure) ([place])   . Right rotator cuff tear 05/20/2014  . Nontraumatic tear of right rotator cuff   . Impingement syndrome of right shoulder   . Thalassemia 02/28/2014  . Rheumatoid arthritis ([place]) 02/28/2014  . Morbid obesity ([place]) 02/28/2014  . Renal insufficiency 02/28/2014  . Acute back pain 02/28/2014  . Essential hypertension 02/28/2014  .  Diabetes ([place]) 02/28/2014  . Dyslipidemia 02/28/2014  . Obstructive sleep apnea 02/28/2014  . Acute on chronic diastolic congestive heart failure, NYHA class 2 ([place]) 02/28/2014  . Iron deficiency anemia 02/16/2014  . Post-menopausal bleeding 08/28/2012    Palliative Care Assessment & Plan   Patient Profile: 57 y.o. female  with past medical history of rheumatoid arthritis on immunosuppressants, hypertension, diabetes, chronic pain, neuropathy, hyperlipidemia admitted on 08/20/2017 with diffuse, back pain, confusion, decreased p.o. intake, and decreased urine output. Patient had been experiencing back pain for approximately 2 weeks prior to admission..  She has since been found to have MSSA sepsis with diffuse seeding of joints and spine.  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  She also has a area to her left heel that is concerning.  She now is in acute kidney injury, with a creatinine of 3.36; prior to this had been within normal limits.  She is been undergoing I&D and also she required cardioversion secondary to atrial fib with RVR. Consult ordered for goals of care.  Assessment:  MSSA bacteremia Endocarditis Severe sepsis with septic shock  AKI on CRRT Afib RVR Encephalopathy Hx of rheumatoid arthritis  Recommendations/Plan:  Patient one-way extubated to nasal cannula.   RN to re-start fentanyl gtt if patient shows s/s of distress. Management per PCCM.  Chaplain  notified of extubation.   Family at bedside understands plan of care and expectations of extubation.  PMT will follow.   Goals of Care and Additional Recommendations:  Limitations on Scope of Treatment: No Surgical Procedures and No Tracheostomy  Code Status:    Code Status Orders  (From admission, onward)        Start     Ordered   09/21/17 0856  Do not attempt resuscitation (DNR)  Continuous    Question Answer Comment  In the  event of cardiac or respiratory ARREST Do not call a "code blue"   In the event of cardiac or respiratory ARREST Do not perform Intubation, CPR, defibrillation or ACLS   In the event of cardiac or respiratory ARREST Use medication by any route, position, wound care, and other measures to relive pain and suffering. May use oxygen, suction and manual treatment of airway obstruction as needed for comfort.      09/21/17 0855    Code Status History    Date Active Date Inactive Code Status Order ID Comments User Context   08/16/2017 1308 09/21/2017 0854 Full Code [id number]  Samella Parr, NP ED   01/19/2016 2029 01/21/2016 2347 Full Code [id number]  Kinnie Feil, MD Inpatient   12/01/2015 0448 12/02/2015 1615 Full Code [id number]  Jani Gravel, MD Inpatient   05/20/2014 2023 05/21/2014 1353 Full Code [id number]  Linda Hedges, PA-C Inpatient   02/28/2014 0008 03/01/2014 1753 Full Code [id number]  Merton Border, MD Inpatient       Prognosis:  Poor prognosis with sepsis secondary to MSSA bacteremia/endocarditis, MSSA septic arthritis  Discharge Planning:  To Be Determined   Thank you for allowing the Palliative Medicine Team to assist in the care of this patient.   Time In: 1340- 1440- Time Out: 5726 2035 Total Time 35mn Prolonged Time Billed  no       Greater than 50%  of this time was spent counseling and coordinating care related to the above assessment and plan.  MIhor Dow FNP-C Palliative Medicine Team  Phone: [phone]: [phone] Please contact Palliative Medicine Team phone at [phone] questions and concerns.

## 2017-09-22 NOTE — Progress Notes (Signed)
Patient with increased heart rate 135 rr 36 and SBP 168 respirations are labored and noisy . Patient's son wants her oxygen back on for comfort and to increase fentanyl for ful comfort. Dr Tarri Abernethy called for orders for pain management

## 2017-09-22 NOTE — Progress Notes (Addendum)
PULMONARY  / CRITICAL CARE MEDICINE  Name: Jessica Howard MRN: [id number] DOB: 1960-07-19    LOS: 92  REFERRING MD :  Velvet Bathe MD  CHIEF COMPLAINT:  Back Pain   BRIEF PATIENT DESCRIPTION: Patient is a 57 y.o female with RA on immunosuppressive therapy (Levunomide and Abatacept) and DM who presented to the ED with 2 weeks of progressive myalgias and arthralgias. Initial work-up was significant for AKI, HAGMA, elevated liver enzymes, lactic acidosis, leukocytosis, and acute on chronic anemia. Blood cultures subsequently grew MSSA (4/4). Initial TTE was negative for features of endocarditis; however, patient has pathologic PE findings including splinter hemorrhages and Olser nodes. Her renal function worsened over the course of her hospitalization and she is requiring CRRT. The patient also converted to atrial fibrillation and was placed on amiodarone, diltiazem, and lopressor. MRI of the right foot illustrated septic arthritis of the right first IP with associated osteomyelitis. MRI of the lumbar spine showed septic arthritis of the right L1-2 facet joint with associated fluid collection. The patient went for I&D on 4/4 and we obtained a TEE that could not exclude endocarditis. She was successfully cardioverted at that time. Since has gone back for another I&D on 4/7, right BKA on 4/9, left UE amputation on 4/11, and I&D on 4/13. She remains critical with a poor prognosis.  LINES / TUBES: Right IJ tunneled HD catheter 3/31 Left peripheral IV Urinary catheter [place] rain right hand  Endotracheal tube 4/4 NG tube   CULTURES: - 4/4 blood cultures on 3/29 for MSSA - Urine cultures on 3/29 growing MSSA - 1/2 Blood cultures on 3/30 with MSSA - Right toe superficial wound culture with staph aureus  - Left wrist synovial fluid with gram positive cocci  - Blood Cultures 4/4 with 1/2 positive - Blood cultures 4/9 NGTD  - Surgical cultures continuing to grow staph aureus   ANTIBIOTICS: -  Vancomycin 3/29 -> discontinued on 3/30  - Aztreonam 3/29 -> discontinued on 3/30  - Flagyl 3/29 -> discontinued on 3/30  - Daptomycin 3/30 -> discontinued 4/13  - Linezolid 4/13 -> Current  - Ciprofloxacin 4/13 -> Current   SIGNIFICANT EVENTS: Admitted for MSSA bacteremia 3/29 >>> Hypotensive and started on CRRT 3/31 >>> Converted to Atrial Fibrillation 4/1 >>> I&D of right wrist, right foot, and left wrist 4/4 >>> TEE with cardioversion 4/4 >>> Repeat blood cultures 1 of 2 positive on 4/4 >>> CRRT stopped 4/4 >>> Hypotension requiring pressors (neo) 4/4 >>> Left on Vent 4/4 >>> IR drainage of right L1-2 facet abscess 4/5 >>> CRRT restarted 4/5 >>> CRRT stopped 4/7 >>> I&D of right wrist, right foot, and left wrist. Amputation of right 1st ray and right second digit at DIP 4/7 >>> Hypotensive requiring pressor support with Neo 4/8 >>> Right BKA 4/9 >>> I&D with left UE amputation 4/11 >>> Repeat I&D of the left foot, right hand, and left elbow 4/13 >>> Restarted CRRT 4/13 >>> Made DNR 4/14 >>>  INTERVAL HISTORY:  No major events overnight.  - 1.6 L over the interval.  On CRRT with 100 mL/hr removed.   Family continuing [place] discussions with palliative care. Patient made DNR on 4/14.  VITAL SIGNS: Temp:  [97.4 F (36.3 C)-98.1 F (36.7 C)] 97.4 F (36.3 C) (04/15 0327) Pulse Rate:  [84-93] 87 (04/15 0630) Resp:  [12-23] 20 (04/15 0630) BP: (96-128)/(52-79) 99/56 (04/15 0630) SpO2:  [99 %-100 %] 100 % (04/15 0630) FiO2 (%):  [40 %] 40 % (04/15 0402)  Weight:  [294 lb 8.6 oz (133.6 kg)] 294 lb 8.6 oz (133.6 kg) (04/15 0348)  INTAKE / OUTPUT: Intake/Output      04/14 0701 - 04/15 0700   I.V. (mL/kg) 697 (5.2)   Other 50   NG/GT 1440   IV Piggyback 850   Total Intake(mL/kg) 3037 (22.7)   Urine (mL/kg/hr) 30 (0)   Other 4561   Total Output 4591   Net -1554         PHYSICAL EXAMINATION:  General: Obese female, sedated  Neuro: Sedated but opens eyes to stimuli  and will track  HEENT: Normocephalic, atraumatic, moist mucus membranes Cardiovascular: RRR, no murmurs, no rubs Lungs: Diffuse rhonchi throughout. Abdomen: Active bowel sounds, soft, no tenderness to palpation  Musculoskeletal: Mild LLE edema, bandages are intact and dry  Skin: Warm and dry   LABS: Cbc Recent Labs  Lab 10/01/2017 0528 09/17/2017 1646 09/21/17 0503 09/22/17 0344  WBC 10.8*  --  10.0 10.0  HGB 6.9* 8.9* 8.6* 9.0*  HCT 21.3* 27.2* 26.6* 27.8*  PLT 215  --  194 186   Chemistry Recent Labs  Lab 09/15/2017 0528 09/10/2017 1646 09/21/17 0503 09/22/17 0344  NA 128* 129* 130* 132*  K 5.7* 3.7 3.9 4.0  CL 96* 98* 99* 99*  CO2 23 21* 23 24  BUN 66* 62* 44* 29*  CREATININE 4.17* 3.96* 2.78* 1.90*  CALCIUM 7.3* 7.3* 7.3* 7.9*  MG 2.0  --  1.9 2.1  PHOS 3.9 4.1 3.3 2.6  GLUCOSE 96 100* 126* 202*   Liver fxn Recent Labs  Lab 09/09/2017 0528 09/27/2017 1646 09/21/17 0503 09/22/17 0344  AST 105*  --  83* 85*  ALT 26  --  32 42  ALKPHOS 402*  --  405* 477*  BILITOT 5.4*  --  6.1* 6.9*  PROT 5.1*  --  5.5* 6.2*  ALBUMIN <1.0*  <1.0* 1.1* 1.1*  1.1* 1.3*  1.3*   coags No results for input(s): APTT, INR in the last 168 hours.   Sepsis markers No results for input(s): LATICACIDVEN, PROCALCITON in the last 168 hours.   Cardiac markers Recent Labs  Lab 09/17/17 0540 09/19/17 0514 09/21/17 0503  CKTOTAL 439* 1,911* 793*   BNP No results for input(s): PROBNP in the last 168 hours.   ABG Recent Labs  Lab 09/15/17 0715 09/19/17 0521 09/24/2017 0245  PHART  --   --  7.320*  PCO2ART  --   --  48.2*  PO2ART  --   --  145.0*  HCO3 22.0 28.1* 24.4  TCO2 23 30 26    CBG trend Recent Labs  Lab 09/21/17 1212 09/21/17 1643 09/21/17 1915 09/21/17 2331 09/22/17 0325  GLUCAP 155* 162* 155* 178* 199*   DIAGNOSES: Principal Problem:   MSSA bacteremia Active Problems:   Thalassemia   Rheumatoid arthritis ([place])   Morbid obesity ([place])   Acute back pain    Essential hypertension   Diabetes ([place])   Dyslipidemia   Severe sepsis with septic shock ([place])   AKI (acute kidney injury) ([place])   Immunosuppression due to drug therapy   Rheumatoid arthritis(714.0)   Septic joint of left wrist ([place])   Osteomyelitis of great toe of right foot ([place])   Hypertension   Hyperlipidemia   Acute hearing loss of left ear   Diabetes mellitus, insulin dependent (IDDM), controlled (HCC)   Beta thalassemia (HCC)   Acute anemia   Elevated liver enzymes   Hyperbilirubinemia   Altered mental status, unspecified  Atrial flutter (HCC)   Infection of right hand   Abscess   Acute renal failure (HCC)   Acute respiratory failure (HCC)   Encephalopathy   Endotracheally intubated   Staphylococcal arthritis of right wrist (HCC)   Facet joint disease   Pleural effusion   Left wrist pain   Goals of care, counseling/discussion   Palliative care by specialist   History of amputation of right lower extremity through tibia and fibula (HCC)   Septic olecranon bursitis of left elbow   Septic olecranon bursitis of right elbow  ASSESSMENT / PLAN: 57 y.o female with RA on immunosuppressive therapy (Levunomide and Abatacept) who presented to the ED on 3/29 with sepsis and multiorgan failure secondary to MSSA Bacteremia/Endocaridits.   Continuing GOC discussion with the family. Appreciate palliative cares assistance with the process.   INFECTIOUS A: Sepsis secondary to MSSA Bacteremia/Endocarditis  MSSA septic arthritis, spinal abscess S/p right BKA on 4/9.  S/p left UE below the elbow amputation 4/11 Blood culutres from 4/9 with NGTD Off pressors  P:   Appreciate ID's help, continuing Linezolid and Ciprofloxacin  CBC stable.  Appreciate orthopedics help.   CARDIOVASCULAR A:  Hypotension Patient is hypertensive at baseline on bystolic, irbesartan, and spironolactone.  Atrial Fib 2/2 sepsis  Cardioverted on 4/4.  On amiodarone   P:  Cardiology signed off  4/8.  Currently in sinus rhythm. Continue Amiodarone. Heparin stopped on 4/14  RENAL A: ARF secondary to MSSA bacteremia Hyponatremia  Restarted CRRT on 4/13  P:   Appreciate nephrology's help. Will discuss increasing fluid removal rate.  Patient remains oliguric.  PULMONARY A: Post surgical ventilation  Obstructive Sleep Apnea  Vent Mode: PRVC FiO2 (%):  [40 %] 40 % Set Rate:  [22 bmp] 22 bmp Vt Set:  [470 mL] 470 mL PEEP:  [5 cmH20] 5 cmH20 Plateau Pressure:  [16 cmH20-23 cmH20] 16 cmH20   Intubate on 4/4, day #11. Family does not want trach or re-intubation after extubation.   P:   RASS: -1/-2 Current sedation: Fentanyl  CXR with continued vascular congestion. VAP precautions Disproportionate increase in peak pressure compared to plateau. Schedule albuterol and start mucomyst.  Optimize conditions for extubation; RASS goal 0, decrease pulmonary vascular congestion, wean ventilator    GASTROINTESTINAL A: Elevated LFTs ? Cholestatic liver injury  Nutrition  GI ppx  P:   Will continue to monitor LFTs  Continue GI ppx  Continue tube feeds.  HEMATOLOGIC A:  Beta Thalassemia  Acute on chronic anemia secondary to sepsis  P:  PRBC for hgb </= 6.9gm%               - exceptions are                         -  if ACS susepcted/confirmed then transfuse for hgb </= 8.0gm%,  or                          -  active bleeding with hemodynamic instability, then transfuse regardless of hemoglobin value              At at all times try to transfuse 1 unit prbc as possible with exception of active hemorrhage  ENDOCRINE A: Insulin Dependent Type 2 DM On Humolin 75 units TID at home   P:   CBG goal 140-180  Continue SSI-moderate q 4hours  NEUROLOGICAL A: Encephalopathy   P:  CT head  preformed. No acute abnormalities.  Wean sedation; RASS goal 0.   Pulmonary and [place] Pager: [phone]  09/22/2017, 6:52 AM

## 2017-09-22 NOTE — Procedures (Signed)
Extubation Procedure Note  Patient Details:   Name: Jessica Howard DOB: [birth year] MRN: [id number]   Airway Documentation:  Airway 7 mm (Active)  Secured at (cm) 23 cm 09/22/2017 12:14 PM  Measured From Lips 09/22/2017 12:14 PM  [place] 09/22/2017 12:14 PM  Secured By [employer] 09/22/2017 12:14 PM  Tube Holder Repositioned Yes 09/22/2017 12:14 PM  Cuff Pressure (cm H2O) 28 cm H2O 09/22/2017  9:00 AM  Site Condition Dry 09/22/2017  9:00 AM    Evaluation  O2 sats: stable throughout Complications: No apparent complications Patient did tolerate procedure well. Bilateral Breath Sounds: Rhonchi   Yes  Dimple Nanas 09/22/2017, 2:23 PM

## 2017-09-22 NOTE — Patient Outreach (Signed)
[facility]) Care Management  09/22/2017  Jessica Howard [birth year] [id number]   57 y.o. year old female referred to [facility] for Medication Assistance ([place] f/u #3) and Case Closure (In hospital >10 days)  Patient has been in hospital  >10 days. Will close case. Please reconsult if needed.  Carlean Jews, Pharm.D., BCPS PGY2 Ambulatory Care Pharmacy Resident Phone: [phone]

## 2017-09-22 NOTE — Progress Notes (Signed)
Patient ID: Jessica Howard, female   DOB: 03/26/1961, 57 y.o.   MRN: [id number] [facility] Progress Note   Assessment/ Plan:    1. Acute kidney injury: Likely from ATN associated with sepsis, remains anuric and without any evident renal recovery.  Continue CRRT at this time for clearance/volume unloading.  Overall prognosis appears poor with metastatic infection and risk for limb loss.  Will be more aggressive with vol removal today for possible extubation.   2. MSSA septicemia with diffuseseeding of joints/spine: S/P right BKA and left hand at forearm level amputation for osteomyelitis/joint+ fascial space infection status post irrigation and debridement yesterday with left elbow olecranon bursectomy and right elbow olecranon bursectomy.  Remains on daptomycin and ciprofloxacin. 3. Atrial fibrillation currently sinus rhythm and rate controlled on amiodarone s/p DCCV. 4. Abnormal LFT's-likely shock liver associated with bacteremia/sepsis.  With severe hypoalbuminemia secondary to acute illness/infection with downtrending transaminases. 5. Anemia of acute illness-secondary to acute illness/surgical losses and bleeding from amputation site requiring temporary cessation of heparin drip, status post PRBC transfusion 6. Hyponatremia-secondary to acute kidney injury/free water excretion defect in this anuric patient, continue to monitor on CRRT 7. Rheumatoid arthritis    Subjective:    Palliative discussions- possible one-way extubation this coming week.     Objective:   BP 118/65   Pulse 95   Temp (!) 97.5 F (36.4 C) (Axillary)   Resp (!) 4   Ht 5' 2" (1.575 m)   Wt 133.6 kg (294 lb 8.6 oz)   LMP 12/27/2013 Comment: irreggular  SpO2 100%   BMI 53.87 kg/m   Intake/Output Summary (Last 24 hours) at 09/22/2017 1003 Last data filed at 09/22/2017 0900 Gross per 24 hour  Intake 3095 ml  Output 4698 ml  Net -1603 ml   Weight change: 0.5 kg (1 lb 1.6 oz)  Physical  Exam: Gen: Intubated, sleeping comfortably CVS: Pulse irregular rhythm, normal rate, S1 and S2 normal Resp: Mechanical breath sounds bilaterally, no distinct rales or rhonchi Abd: Soft, obese, nontender Ext: Right arm in dressing, s/p R BKA, status post left hand amputation at forearm level  Imaging: Dg Chest Port 1 View  Result Date: 09/22/2017 CLINICAL DATA:  Endotracheally intubated. EXAM: PORTABLE CHEST 1 VIEW COMPARISON:  One-view chest x-ray 09/21/2017 FINDINGS: The heart is enlarged. This is exaggerated by low lung volumes. Endotracheal tube terminates 3.2 cm above the carina. NG tube courses off the inferior border of the film. A right IJ catheter terminates at the cavoatrial junction. Diffuse interstitial edema and bilateral effusions, right greater than left, are increasing. Bibasilar airspace disease has increased. IMPRESSION: 1. Support apparatus is stable. 2. Increasing interstitial edema and bilateral effusions, right greater than left. Findings are compatible congestive heart failure. 3. Bibasilar airspace disease likely reflects atelectasis. Infection is not excluded. Electronically Signed   By: San Morelle M.D.   On: 09/22/2017 07:15   Dg Chest Port 1 View  Result Date: 09/21/2017 CLINICAL DATA:  Endotracheal tube present EXAM: PORTABLE CHEST 1 VIEW COMPARISON:  Chest x-rays dated 09/19/2017 and 09/19/2017. FINDINGS: Endotracheal tube remains well positioned with tip approximately 3 cm above the carina. Enteric tube passes below the diaphragm. RIGHT IJ catheter is stable in position with tip overlying the lower SVC/cavoatrial junction. Improved aeration within the perihilar and lower lung regions bilaterally. Mild central pulmonary vascular congestion persists. Persistent hazy opacity at the RIGHT lung base likely represents small pleural effusion and/or atelectasis. No pneumothorax seen. IMPRESSION: 1. Improved lung aeration bilaterally  suggesting improved fluid status.  Persistent mild central pulmonary vascular congestion. 2. Probable small RIGHT pleural effusion and/or atelectasis. 3. Support apparatus remains appropriately positioned. Electronically Signed   By: Franki Cabot M.D.   On: 09/21/2017 07:26    Labs: BMET Recent Labs  Lab 09/17/17 0540 09/10/2017 0515 09/19/17 0514 10/05/2017 0528 09/10/2017 1646 09/21/17 0503 09/22/17 0344  NA 133* 132* 130* 128* 129* 130* 132*  K 5.1 3.7 4.2 5.7* 3.7 3.9 4.0  CL 99* 97* 96* 96* 98* 99* 99*  CO2 _0 21* 23 24  GLUCOSE 118* 112* 123* 96 100* 126* 202*  BUN 36* 28* 50* 66* 62* 44* 29*  CREATININE 3.13* 2.51* 3.43* 4.17* 3.96* 2.78* 1.90*  CALCIUM 7.5* 7.4* 7.5* 7.3* 7.3* 7.3* 7.9*  PHOS 6.6* 4.0 4.4 3.9 4.1 3.3 2.6   CBC Recent Labs  Lab 09/19/17 0514 09/19/2017 0528 10/04/2017 1646 09/21/17 0503 09/22/17 0344  WBC 14.0* 10.8*  --  10.0 10.0  HGB 7.9* 6.9* 8.9* 8.6* 9.0*  HCT 24.5* 21.3* 27.2* 26.6* 27.8*  MCV 83.9 82.2  --  83.1 84.5  PLT 197 215  --  194 186    Medications:    . acetylcysteine  4 mL Nebulization BID  . albuterol  2.5 mg Nebulization Q6H  . amiodarone  200 mg Oral Daily  . chlorhexidine gluconate (MEDLINE KIT)  15 mL Mouth Rinse BID  . Chlorhexidine Gluconate Cloth  6 each Topical Daily  . docusate  100 mg Per Tube BID  . insulin aspart  0-15 Units Subcutaneous Q4H  . lactulose  10 g Oral Daily  . mouth rinse  15 mL Mouth Rinse 10 times per day  . pantoprazole sodium  40 mg Per Tube Daily  . sennosides  5 mL Per Tube BID  . sodium chloride flush  3 mL Intravenous Q12H   Madelon Lips, MD [facility] pgr [phone] 09/22/2017, 10:03 AM

## 2017-09-22 NOTE — Progress Notes (Signed)
Patient's son requested that her nasal cannula oxygen be  Removed and her pain medicine restarted. Dr Tarri Abernethy and Dr. Lamonte Sakai informed . Fentanyl infusion restarted for comfort

## 2017-09-22 NOTE — Progress Notes (Signed)
Subjective:  Intubated, sedated  Antibiotics:  Anti-infectives (From admission, onward)   Start     Dose/Rate Route Frequency Ordered Stop   10/02/2017 2000  linezolid (ZYVOX) IVPB 600 mg     600 mg 300 mL/hr over 60 Minutes Intravenous Every 12 hours 09/19/17 0829     10/02/2017 1800  ciprofloxacin (CIPRO) IVPB 400 mg     400 mg 200 mL/hr over 60 Minutes Intravenous Every 12 hours 09/26/2017 0752     09/08/2017 1000  ciprofloxacin (CIPRO) IVPB 400 mg  Status:  Discontinued     400 mg 200 mL/hr over 60 Minutes Intravenous 2 times daily 09/17/2017 0751 09/17/2017 0752   09/29/2017 2100  DAPTOmycin (CUBICIN) 1,000 mg in sodium chloride 0.9 % IVPB  Status:  Discontinued     1,000 mg 240 mL/hr over 30 Minutes Intravenous Every 48 hours 09/24/2017 2033 09/29/2017 2047   09/08/2017 1800  ciprofloxacin (CIPRO) IVPB 400 mg  Status:  Discontinued     400 mg 200 mL/hr over 60 Minutes Intravenous Every 24 hours 09/17/2017 1632 10/06/2017 0751   09/27/2017 2047  DAPTOmycin (CUBICIN) 1,000 mg in sodium chloride 0.9 % IVPB  Status:  Discontinued     1,000 mg 240 mL/hr over 30 Minutes Intravenous Every 48 hours 09/17/2017 2047 09/19/17 0829   10/04/2017 1800  DAPTOmycin (CUBICIN) 1,000 mg in sodium chloride 0.9 % IVPB  Status:  Discontinued     1,000 mg 240 mL/hr over 30 Minutes Intravenous Every 48 hours 10/03/2017 1103 09/19/2017 2033   09/08/17 1600  DAPTOmycin (CUBICIN) 1,000 mg in sodium chloride 0.9 % IVPB  Status:  Discontinued     1,000 mg 240 mL/hr over 30 Minutes Intravenous Every 48 hours 09/07/17 1019 09/27/2017 1103   09/07/17 1000  vancomycin (VANCOCIN) 1,500 mg in sodium chloride 0.9 % 500 mL IVPB  Status:  Discontinued     1,500 mg 250 mL/hr over 120 Minutes Intravenous Every 48 hours 09/06/2017 0906 09/06/17 0935   09/06/17 1600  DAPTOmycin (CUBICIN) 984 mg in sodium chloride 0.9 % IVPB  Status:  Discontinued     8 mg/kg  123 kg 239.4 mL/hr over 30 Minutes Intravenous Every 48 hours 09/06/17 1436  09/07/17 1019   09/06/17 1000  aztreonam (AZACTAM) 0.5 g in dextrose 5 % 50 mL IVPB  Status:  Discontinued     0.5 g 100 mL/hr over 30 Minutes Intravenous Every 8 hours 09/06/17 0943 09/06/17 1223   09/04/2017 2000  metroNIDAZOLE (FLAGYL) IVPB 500 mg  Status:  Discontinued     500 mg 100 mL/hr over 60 Minutes Intravenous Every 8 hours 08/18/2017 1343 09/06/17 0028   08/12/2017 0900  aztreonam (AZACTAM) 2 g in sodium chloride 0.9 % 100 mL IVPB  Status:  Discontinued     2 g 200 mL/hr over 30 Minutes Intravenous Every 8 hours 08/22/2017 0853 09/06/17 0028   08/22/2017 0900  metroNIDAZOLE (FLAGYL) IVPB 500 mg     500 mg 100 mL/hr over 60 Minutes Intravenous  Once 08/13/2017 0853 08/15/2017 1035   09/03/2017 0830  piperacillin-tazobactam (ZOSYN) IVPB 3.375 g  Status:  Discontinued     3.375 g 100 mL/hr over 30 Minutes Intravenous  Once 08/25/2017 0816 08/08/2017 0836   08/23/2017 0830  vancomycin (VANCOCIN) IVPB 1000 mg/200 mL premix  Status:  Discontinued     1,000 mg 200 mL/hr over 60 Minutes Intravenous  Once 09/01/2017 0816 08/12/2017 0822   08/29/2017 0830  vancomycin (VANCOCIN)  2,000 mg in sodium chloride 0.9 % 500 mL IVPB     2,000 mg 250 mL/hr over 120 Minutes Intravenous  Once 09/04/2017 0822 08/16/2017 1342      Medications: Scheduled Meds: . acetylcysteine  4 mL Nebulization BID  . albuterol  2.5 mg Nebulization Q6H  . amiodarone  200 mg Oral Daily  . chlorhexidine gluconate (MEDLINE KIT)  15 mL Mouth Rinse BID  . Chlorhexidine Gluconate Cloth  6 each Topical Daily  . docusate  100 mg Per Tube BID  . insulin aspart  0-15 Units Subcutaneous Q4H  . lactulose  10 g Oral Daily  . mouth rinse  15 mL Mouth Rinse 10 times per day  . pantoprazole sodium  40 mg Per Tube Daily  . sennosides  5 mL Per Tube BID  . sodium chloride flush  3 mL Intravenous Q12H   Continuous Infusions: . sodium chloride Stopped (09/21/17 0500)  . ciprofloxacin Stopped (09/22/17 0601)  . feeding supplement (VITAL HIGH PROTEIN)  1,000 mL (09/22/17 0456)  . fentaNYL infusion INTRAVENOUS 100 mcg/hr (09/22/17 0900)  . heparin 999 mL/hr at 09/22/17 0608  . linezolid (ZYVOX) IV Stopped (09/22/17 1191)  . dialysis replacement fluid (prismasate) 400 mL/hr at 09/22/17 0443  . dialysis replacement fluid (prismasate) 200 mL/hr at 09/21/17 2302  . dialysate (PRISMASATE) 2,000 mL/hr at 09/22/17 1140   PRN Meds:.acetaminophen **OR** acetaminophen, fentaNYL, heparin, LORazepam, ondansetron **OR** ondansetron (ZOFRAN) IV    Objective: Weight change: 1 lb 1.6 oz (0.5 kg)  Intake/Output Summary (Last 24 hours) at 09/22/2017 1242 Last data filed at 09/22/2017 1200 Gross per 24 hour  Intake 3055 ml  Output 4703 ml  Net -1648 ml   Blood pressure 139/74, pulse 98, temperature (!) 96.8 F (36 C), temperature source Axillary, resp. rate 10, height 5' 2"  (1.575 m), weight 294 lb 8.6 oz (133.6 kg), last menstrual period 12/27/2013, SpO2 100 %. Temp:  [96.8 F (36 C)-97.9 F (36.6 C)] 96.8 F (36 C) (04/15 1138) Pulse Rate:  [84-101] 98 (04/15 1230) Resp:  [4-23] 10 (04/15 1230) BP: (96-161)/(52-88) 139/74 (04/15 1230) SpO2:  [99 %-100 %] 100 % (04/15 1230) FiO2 (%):  [40 %] 40 % (04/15 1214) Weight:  [294 lb 8.6 oz (133.6 kg)] 294 lb 8.6 oz (133.6 kg) (04/15 0348)  Physical Exam: General: Intubated  HEENT: anicteric sclera,EOMI intubated, moves  Head about CVS regular rate, normal r,  no murmur rubs or gallops Chest: clear to auscultation anteriorly  abdomen: soft, nondistended, normal bowel sounds, Extremities/skin: Left UE amputation site bandaged RUE: bandage RLE BKA bandage LLE: ankle with wrapping Neuro: no new focal neuro deficits  CBC:    BMET Recent Labs    09/21/17 0503 09/22/17 0344  NA 130* 132*  K 3.9 4.0  CL 99* 99*  CO2 23 24  GLUCOSE 126* 202*  BUN 44* 29*  CREATININE 2.78* 1.90*  CALCIUM 7.3* 7.9*     Liver Panel  Recent Labs    09/21/17 0503 09/22/17 0344  PROT 5.5* 6.2*    ALBUMIN 1.1*  1.1* 1.3*  1.3*  AST 83* 85*  ALT 32 42  ALKPHOS 405* 477*  BILITOT 6.1* 6.9*  BILIDIR 4.2* 4.8*  IBILI 1.9* 2.1*       Sedimentation Rate No results for input(s): ESRSEDRATE in the last 72 hours. C-Reactive Protein No results for input(s): CRP in the last 72 hours.  Micro Results: Recent Results (from the past 720 hour(s))  Blood Culture (routine x 2)  Status: Abnormal   Collection Time: 08/09/2017  9:50 AM  Result Value Ref Range Status   Specimen Description BLOOD RIGHT HAND  Final   Special Requests   Final    BOTTLES DRAWN AEROBIC AND ANAEROBIC Blood Culture adequate volume   Culture  Setup Time   Final    GRAM POSITIVE COCCI IN BOTH AEROBIC AND ANAEROBIC BOTTLES CRITICAL RESULT CALLED TO, READ BACK BY AND VERIFIED WITH: G ABBOTT 09/06/17 0006 JDW    Culture (A)  Final    STAPHYLOCOCCUS AUREUS SUSCEPTIBILITIES PERFORMED ON PREVIOUS CULTURE WITHIN THE LAST 5 DAYS. Performed at [facility], [place] [address] [place], [place] [address]    Report Status 09/08/2017 FINAL  Final  Blood Culture (routine x 2)     Status: Abnormal   Collection Time: 08/25/2017  1:54 PM  Result Value Ref Range Status   Specimen Description BLOOD RIGHT FOREARM  Final   Special Requests   Final    BOTTLES DRAWN AEROBIC AND ANAEROBIC Blood Culture adequate volume   Culture  Setup Time   Final    GRAM POSITIVE COCCI IN BOTH AEROBIC AND ANAEROBIC BOTTLES CRITICAL RESULT CALLED TO, READ BACK BY AND VERIFIED WITH: D[place] Brick PHARMD 09/06/17 0006 JDW Performed at [facility], [place] [address], [place], [place] [address]    Culture STAPHYLOCOCCUS AUREUS (A)  Final   Report Status 09/08/2017 FINAL  Final   Organism ID, Bacteria STAPHYLOCOCCUS AUREUS  Final      Susceptibility   Staphylococcus aureus - MIC*    CIPROFLOXACIN <=0.5 SENSITIVE Sensitive     ERYTHROMYCIN <=0.25 SENSITIVE Sensitive     GENTAMICIN <=0.5 SENSITIVE Sensitive     OXACILLIN 0.5 SENSITIVE Sensitive      TETRACYCLINE <=1 SENSITIVE Sensitive     VANCOMYCIN 1 SENSITIVE Sensitive     TRIMETH/SULFA <=10 SENSITIVE Sensitive     CLINDAMYCIN <=0.25 SENSITIVE Sensitive     RIFAMPIN <=0.5 SENSITIVE Sensitive     Inducible Clindamycin NEGATIVE Sensitive     * STAPHYLOCOCCUS AUREUS  Blood Culture ID Panel (Reflexed)     Status: Abnormal   Collection Time: 09/07/2017  1:54 PM  Result Value Ref Range Status   Enterococcus species NOT DETECTED NOT DETECTED Final   Listeria monocytogenes NOT DETECTED NOT DETECTED Final   Staphylococcus species DETECTED (A) NOT DETECTED Final    Comment: CRITICAL RESULT CALLED TO, READ BACK BY AND VERIFIED WITH: G ABBOTT PHARMD 09/06/17 0006 JDW    Staphylococcus aureus DETECTED (A) NOT DETECTED Final    Comment: Methicillin (oxacillin) susceptible Staphylococcus aureus (MSSA). Preferred therapy is anti staphylococcal beta lactam antibiotic (Cefazolin or Nafcillin), unless clinically contraindicated. CRITICAL RESULT CALLED TO, READ BACK BY AND VERIFIED WITH: G ABBOTT PHARMD 09/06/17 0006 JDW    Methicillin resistance NOT DETECTED NOT DETECTED Final   Streptococcus species NOT DETECTED NOT DETECTED Final   Streptococcus agalactiae NOT DETECTED NOT DETECTED Final   Streptococcus pneumoniae NOT DETECTED NOT DETECTED Final   Streptococcus pyogenes NOT DETECTED NOT DETECTED Final   Acinetobacter baumannii NOT DETECTED NOT DETECTED Final   Enterobacteriaceae species NOT DETECTED NOT DETECTED Final   Enterobacter cloacae complex NOT DETECTED NOT DETECTED Final   Escherichia coli NOT DETECTED NOT DETECTED Final   Klebsiella oxytoca NOT DETECTED NOT DETECTED Final   Klebsiella pneumoniae NOT DETECTED NOT DETECTED Final   Proteus species NOT DETECTED NOT DETECTED Final   Serratia marcescens NOT DETECTED NOT DETECTED Final   Haemophilus influenzae NOT  DETECTED NOT DETECTED Final   Neisseria meningitidis NOT DETECTED NOT DETECTED Final   Pseudomonas aeruginosa NOT DETECTED  NOT DETECTED Final   Candida albicans NOT DETECTED NOT DETECTED Final   Candida glabrata NOT DETECTED NOT DETECTED Final   Candida krusei NOT DETECTED NOT DETECTED Final   Candida parapsilosis NOT DETECTED NOT DETECTED Final   Candida tropicalis NOT DETECTED NOT DETECTED Final    Comment: Performed at [facility], [place] [address] [place], [place] [address]  Urine culture     Status: Abnormal   Collection Time: 09/01/2017  3:56 PM  Result Value Ref Range Status   Specimen Description URINE, RANDOM  Final   Special Requests   Final    NONE Performed at [facility], [place] [address], [place], [place] [address]    Culture 50,000 COLONIES/mL STAPHYLOCOCCUS AUREUS (A)  Final   Report Status 09/07/2017 FINAL  Final   Organism ID, Bacteria STAPHYLOCOCCUS AUREUS (A)  Final      Susceptibility   Staphylococcus aureus - MIC*    CIPROFLOXACIN <=0.5 SENSITIVE Sensitive     GENTAMICIN <=0.5 SENSITIVE Sensitive     NITROFURANTOIN 32 SENSITIVE Sensitive     OXACILLIN 0.5 SENSITIVE Sensitive     TETRACYCLINE <=1 SENSITIVE Sensitive     VANCOMYCIN 1 SENSITIVE Sensitive     TRIMETH/SULFA <=10 SENSITIVE Sensitive     CLINDAMYCIN <=0.25 SENSITIVE Sensitive     RIFAMPIN <=0.5 SENSITIVE Sensitive     Inducible Clindamycin NEGATIVE Sensitive     * 50,000 COLONIES/mL STAPHYLOCOCCUS AUREUS  MRSA PCR Screening     Status: None   Collection Time: 09/06/17  5:20 AM  Result Value Ref Range Status   MRSA by PCR NEGATIVE NEGATIVE Final    Comment:        The GeneXpert MRSA Assay (FDA approved for NASAL specimens only), is one component of a comprehensive MRSA colonization surveillance program. It is not intended to diagnose MRSA infection nor to guide or monitor treatment for MRSA infections. Performed at [facility], [place] [address], [place], [place] [address]   Culture, blood (routine x 2)     Status: Abnormal   Collection Time: 09/07/17  7:09 AM  Result Value Ref Range Status    Specimen Description BLOOD RIGHT HAND  Final   Special Requests AEROBIC BOTTLE ONLY Blood Culture adequate volume  Final   Culture  Setup Time   Final    GRAM POSITIVE COCCI AEROBIC BOTTLE ONLY CRITICAL VALUE NOTED.  VALUE IS CONSISTENT WITH PREVIOUSLY REPORTED AND CALLED VALUE.    Culture (A)  Final    STAPHYLOCOCCUS AUREUS SUSCEPTIBILITIES PERFORMED ON PREVIOUS CULTURE WITHIN THE LAST 5 DAYS. Performed at [facility], [place] [address], [place], [place] [address]    Report Status 09/09/2017 FINAL  Final  Culture, blood (routine x 2)     Status: None   Collection Time: 09/07/17  7:17 AM  Result Value Ref Range Status   Specimen Description BLOOD LEFT ANTECUBITAL  Final   Special Requests   Final    IN BOTH AEROBIC AND ANAEROBIC BOTTLES Blood Culture results may not be optimal due to an excessive volume of blood received in culture bottles   Culture   Final    NO GROWTH 5 DAYS Performed at [facility], [place] [address], [place], [place] [address]    Report Status 09/12/2017 FINAL  Final  Aerobic Culture (superficial specimen)  Status: None   Collection Time: 09/08/17  3:10 PM  Result Value Ref Range Status   Specimen Description WOUND RIGHT TOE  Final   Special Requests   Final    NONE Performed at [facility], [address], [place], [place] [address]    Gram Stain   Final    RARE WBC PRESENT, PREDOMINANTLY MONONUCLEAR FEW GRAM POSITIVE COCCI ABUNDANT SQUAMOUS EPITHELIAL CELLS PRESENT    Culture ABUNDANT STAPHYLOCOCCUS AUREUS  Final   Report Status 09/10/2017 FINAL  Final   Organism ID, Bacteria STAPHYLOCOCCUS AUREUS  Final      Susceptibility   Staphylococcus aureus - MIC*    CIPROFLOXACIN <=0.5 SENSITIVE Sensitive     ERYTHROMYCIN <=0.25 SENSITIVE Sensitive     GENTAMICIN <=0.5 SENSITIVE Sensitive     OXACILLIN 0.5 SENSITIVE Sensitive     TETRACYCLINE <=1 SENSITIVE Sensitive     VANCOMYCIN <=0.5 SENSITIVE Sensitive     TRIMETH/SULFA <=10  SENSITIVE Sensitive     CLINDAMYCIN <=0.25 SENSITIVE Sensitive     RIFAMPIN <=0.5 SENSITIVE Sensitive     Inducible Clindamycin NEGATIVE Sensitive     * ABUNDANT STAPHYLOCOCCUS AUREUS  Body fluid culture     Status: None   Collection Time: 09/09/17  3:00 PM  Result Value Ref Range Status   Specimen Description SYNOVIAL LEFT WRIST  Final   Special Requests NONE  Final   Gram Stain   Final    ABUNDANT WBC PRESENT, PREDOMINANTLY PMN MODERATE GRAM POSITIVE COCCI Performed at [facility], [place] [address], [place], [place] [address]    Culture FEW STAPHYLOCOCCUS AUREUS  Final   Report Status 09/12/2017 FINAL  Final   Organism ID, Bacteria STAPHYLOCOCCUS AUREUS  Final      Susceptibility   Staphylococcus aureus - MIC*    CIPROFLOXACIN <=0.5 SENSITIVE Sensitive     ERYTHROMYCIN <=0.25 SENSITIVE Sensitive     GENTAMICIN <=0.5 SENSITIVE Sensitive     OXACILLIN 0.5 SENSITIVE Sensitive     TETRACYCLINE <=1 SENSITIVE Sensitive     VANCOMYCIN <=0.5 SENSITIVE Sensitive     TRIMETH/SULFA <=10 SENSITIVE Sensitive     CLINDAMYCIN <=0.25 SENSITIVE Sensitive     RIFAMPIN <=0.5 SENSITIVE Sensitive     Inducible Clindamycin NEGATIVE Sensitive     * FEW STAPHYLOCOCCUS AUREUS  Culture, blood (Routine X 2) w Reflex to ID Panel     Status: Abnormal   Collection Time: 10/06/2017  9:25 AM  Result Value Ref Range Status   Specimen Description BLOOD LEFT FOREARM  Final   Special Requests   Final    BOTTLES DRAWN AEROBIC AND ANAEROBIC Blood Culture adequate volume   Culture  Setup Time   Final    GRAM POSITIVE COCCI AEROBIC BOTTLE ONLY CRITICAL RESULT CALLED TO, READ BACK BY AND VERIFIED WITH: A MEYER,PHARMD AT [place] 09/17/2017 BY L BENFIELD Performed at [facility], [place] [address], [place], [place] [address]    Culture STAPHYLOCOCCUS AUREUS (A)  Final   Report Status 09/30/2017 FINAL  Final   Organism ID, Bacteria STAPHYLOCOCCUS AUREUS  Final      Susceptibility   Staphylococcus aureus - MIC*      CIPROFLOXACIN <=0.5 SENSITIVE Sensitive     ERYTHROMYCIN <=0.25 SENSITIVE Sensitive     GENTAMICIN <=0.5 SENSITIVE Sensitive     OXACILLIN 0.5 SENSITIVE Sensitive     TETRACYCLINE <=1 SENSITIVE Sensitive     VANCOMYCIN 1 SENSITIVE Sensitive     TRIMETH/SULFA <=10 SENSITIVE Sensitive  CLINDAMYCIN <=0.25 SENSITIVE Sensitive     RIFAMPIN <=0.5 SENSITIVE Sensitive     Inducible Clindamycin NEGATIVE Sensitive     * STAPHYLOCOCCUS AUREUS  Culture, blood (single)     Status: None   Collection Time: 09/22/2017  9:32 AM  Result Value Ref Range Status   Specimen Description BLOOD RIGHT HAND  Final   Special Requests   Final    BOTTLES DRAWN AEROBIC ONLY Blood Culture adequate volume   Culture   Final    NO GROWTH 5 DAYS Performed at [facility], [place] [address] [place], [place] [address]    Report Status 09/12/2017 FINAL  Final  Aerobic/Anaerobic Culture (surgical/deep wound)     Status: None   Collection Time: 09/12/17  1:53 PM  Result Value Ref Range Status   Specimen Description ABSCESS  Final   Special Requests Normal  Final   Gram Stain   Final    ABUNDANT WBC PRESENT, PREDOMINANTLY PMN ABUNDANT GRAM POSITIVE COCCI IN CLUSTERS    Culture   Final    FEW STAPHYLOCOCCUS AUREUS NO ANAEROBES ISOLATED Performed at [facility], [place] [address], [place], [place] [address]    Report Status 09/17/2017 FINAL  Final   Organism ID, Bacteria STAPHYLOCOCCUS AUREUS  Final      Susceptibility   Staphylococcus aureus - MIC*    CIPROFLOXACIN <=0.5 SENSITIVE Sensitive     ERYTHROMYCIN <=0.25 SENSITIVE Sensitive     GENTAMICIN <=0.5 SENSITIVE Sensitive     OXACILLIN 0.5 SENSITIVE Sensitive     TETRACYCLINE <=1 SENSITIVE Sensitive     VANCOMYCIN <=0.5 SENSITIVE Sensitive     TRIMETH/SULFA <=10 SENSITIVE Sensitive     CLINDAMYCIN <=0.25 SENSITIVE Sensitive     RIFAMPIN <=0.5 SENSITIVE Sensitive     Inducible Clindamycin NEGATIVE Sensitive     * FEW STAPHYLOCOCCUS AUREUS   Blood culture (routine x 2)     Status: None   Collection Time: 09/25/2017  7:17 AM  Result Value Ref Range Status   Specimen Description BLOOD LEFT ANTECUBITAL  Final   Special Requests   Final    BOTTLES DRAWN AEROBIC AND ANAEROBIC Blood Culture adequate volume   Culture   Final    NO GROWTH 5 DAYS Performed at [facility], [address] [place], [place] [address]    Report Status 09/21/2017 FINAL  Final  Aerobic/Anaerobic Culture (surgical/deep wound)     Status: None   Collection Time: 09/17/2017  5:00 PM  Result Value Ref Range Status   Specimen Description WOUND  Final   Special Requests LEFT ELBOW PT ON DAPTOMYCIN  Final   Gram Stain   Final    RARE WBC PRESENT, PREDOMINANTLY PMN FEW GRAM POSITIVE COCCI    Culture   Final    MODERATE STAPHYLOCOCCUS AUREUS NO ANAEROBES ISOLATED Performed at [facility], [place] [address], [place], [place] [address]    Report Status 09/21/2017 FINAL  Final   Organism ID, Bacteria STAPHYLOCOCCUS AUREUS  Final      Susceptibility   Staphylococcus aureus - MIC*    CIPROFLOXACIN <=0.5 SENSITIVE Sensitive     ERYTHROMYCIN <=0.25 SENSITIVE Sensitive     GENTAMICIN <=0.5 SENSITIVE Sensitive     OXACILLIN 0.5 SENSITIVE Sensitive     TETRACYCLINE <=1 SENSITIVE Sensitive     VANCOMYCIN <=0.5 SENSITIVE Sensitive     TRIMETH/SULFA <=10 SENSITIVE Sensitive     CLINDAMYCIN <=0.25 SENSITIVE Sensitive     RIFAMPIN <=0.5 SENSITIVE  Sensitive     Inducible Clindamycin NEGATIVE Sensitive     * MODERATE STAPHYLOCOCCUS AUREUS  Surgical PCR screen     Status: Abnormal   Collection Time: 09/17/17  8:56 PM  Result Value Ref Range Status   MRSA, PCR NEGATIVE NEGATIVE Final   Staphylococcus aureus POSITIVE (A) NEGATIVE Final    Comment: (NOTE) The Xpert SA Assay (FDA approved for NASAL specimens in patients 32 years of age and older), is one component of a comprehensive surveillance program. It is not intended to diagnose infection nor to guide  or monitor treatment. Performed at [facility], [place] [address], [place], [place] [address]   Body fluid culture     Status: None   Collection Time: 09/17/2017  9:47 AM  Result Value Ref Range Status   Specimen Description SYNOVIAL RIGHT ELBOW  Final   Special Requests PATIENT ON FOLLOWING DAPTOMYCIN SPEC A  Final   Gram Stain   Final    RARE WBC PRESENT, PREDOMINANTLY MONONUCLEAR NO ORGANISMS SEEN    Culture   Final    NO GROWTH 3 DAYS Performed at [facility], [place] [address], [place], [place] [address]    Report Status 09/21/2017 FINAL  Final  Aerobic/Anaerobic Culture (surgical/deep wound)     Status: None (Preliminary result)   Collection Time: 09/17/2017 10:21 AM  Result Value Ref Range Status   Specimen Description ABSCESS RIGHT ELBOW BURSA  Final   Special Requests SPEC B PATIENT ON FOLLOWING DAPTOMYCIN  Final   Gram Stain   Final    RARE WBC PRESENT, PREDOMINANTLY PMN FEW GRAM POSITIVE COCCI IN PAIRS IN CLUSTERS Performed at [facility], [place] [address] [place], [place] [address]    Culture   Final    FEW STAPHYLOCOCCUS AUREUS NO ANAEROBES ISOLATED; CULTURE IN PROGRESS FOR 5 DAYS    Report Status PENDING  Incomplete   Organism ID, Bacteria STAPHYLOCOCCUS AUREUS  Final      Susceptibility   Staphylococcus aureus - MIC*    CIPROFLOXACIN <=0.5 SENSITIVE Sensitive     ERYTHROMYCIN <=0.25 SENSITIVE Sensitive     GENTAMICIN <=0.5 SENSITIVE Sensitive     OXACILLIN 0.5 SENSITIVE Sensitive     TETRACYCLINE <=1 SENSITIVE Sensitive     VANCOMYCIN <=0.5 SENSITIVE Sensitive     TRIMETH/SULFA <=10 SENSITIVE Sensitive     CLINDAMYCIN <=0.25 SENSITIVE Sensitive     RIFAMPIN <=0.5 SENSITIVE Sensitive     Inducible Clindamycin NEGATIVE Sensitive     * FEW STAPHYLOCOCCUS AUREUS  Aerobic/Anaerobic Culture (surgical/deep wound)     Status: None (Preliminary result)   Collection Time: 09/22/2017 10:39 AM  Result Value Ref Range Status   Specimen Description ABSCESS  LEFT HEEL  Final   Special Requests SPEC C PATIENT ON FOLLOWING DAPTOMYCIN  Final   Gram Stain   Final    RARE WBC PRESENT, PREDOMINANTLY PMN FEW GRAM POSITIVE COCCI IN PAIRS Performed at [facility], [place] [address] [place],  [address]    Culture   Final    FEW STAPHYLOCOCCUS AUREUS NO ANAEROBES ISOLATED; CULTURE IN PROGRESS FOR 5 DAYS    Report Status PENDING  Incomplete   Organism ID, Bacteria STAPHYLOCOCCUS AUREUS  Final      Susceptibility   Staphylococcus aureus - MIC*    CIPROFLOXACIN <=0.5 SENSITIVE Sensitive     ERYTHROMYCIN <=0.25 SENSITIVE Sensitive     GENTAMICIN <=0.5 SENSITIVE Sensitive     OXACILLIN 0.5 SENSITIVE Sensitive  TETRACYCLINE <=1 SENSITIVE Sensitive     VANCOMYCIN 1 SENSITIVE Sensitive     TRIMETH/SULFA <=10 SENSITIVE Sensitive     CLINDAMYCIN <=0.25 SENSITIVE Sensitive     RIFAMPIN <=0.5 SENSITIVE Sensitive     Inducible Clindamycin NEGATIVE Sensitive     * FEW STAPHYLOCOCCUS AUREUS  Aerobic Culture (superficial specimen)     Status: None   Collection Time: 10/07/2017 10:43 AM  Result Value Ref Range Status   Specimen Description WOUND LEFT ANKLE PUS  Final   Special Requests SPEC D PATIENT ON FOLLOWING DAPTOMYCIN  Final   Gram Stain   Final    RARE WBC PRESENT, PREDOMINANTLY PMN FEW GRAM POSITIVE COCCI IN PAIRS Performed at [facility], [place] [address], [place], [place] [address]    Culture MODERATE STAPHYLOCOCCUS AUREUS  Final   Report Status 09/21/2017 FINAL  Final   Organism ID, Bacteria STAPHYLOCOCCUS AUREUS  Final      Susceptibility   Staphylococcus aureus - MIC*    CIPROFLOXACIN <=0.5 SENSITIVE Sensitive     ERYTHROMYCIN <=0.25 SENSITIVE Sensitive     GENTAMICIN <=0.5 SENSITIVE Sensitive     OXACILLIN 0.5 SENSITIVE Sensitive     TETRACYCLINE <=1 SENSITIVE Sensitive     VANCOMYCIN 1 SENSITIVE Sensitive     TRIMETH/SULFA <=10 SENSITIVE Sensitive     CLINDAMYCIN <=0.25 SENSITIVE Sensitive     RIFAMPIN <=0.5  SENSITIVE Sensitive     Inducible Clindamycin NEGATIVE Sensitive     * MODERATE STAPHYLOCOCCUS AUREUS  Anaerobic culture     Status: None (Preliminary result)   Collection Time: 09/12/2017 10:43 AM  Result Value Ref Range Status   Specimen Description WOUND LEFT ANKLE PUS  Final   Special Requests   Final    SPEC D PATIENT ON FOLLOWING DAPTOMYCIN Performed at [facility], [place] [address] [place], [place] [address]    Culture   Final    NO ANAEROBES ISOLATED; CULTURE IN PROGRESS FOR 5 DAYS   Report Status PENDING  Incomplete  Aerobic Culture (superficial specimen)     Status: None   Collection Time: 09/30/2017 10:44 AM  Result Value Ref Range Status   Specimen Description WOUND LEFT ANKLE DEEP PUS  Final   Special Requests SPEC E PATIENT ON FOLLOWING DAPTOMYCIN  Final   Gram Stain   Final    RARE WBC PRESENT, PREDOMINANTLY PMN RARE GRAM POSITIVE COCCI IN PAIRS RARE GRAM NEGATIVE RODS Performed at [facility], [place] [address], [place], [place] [address]    Culture FEW STAPHYLOCOCCUS AUREUS  Final   Report Status 09/21/2017 FINAL  Final   Organism ID, Bacteria STAPHYLOCOCCUS AUREUS  Final      Susceptibility   Staphylococcus aureus - MIC*    CIPROFLOXACIN <=0.5 SENSITIVE Sensitive     ERYTHROMYCIN <=0.25 SENSITIVE Sensitive     GENTAMICIN <=0.5 SENSITIVE Sensitive     OXACILLIN 0.5 SENSITIVE Sensitive     TETRACYCLINE <=1 SENSITIVE Sensitive     VANCOMYCIN 1 SENSITIVE Sensitive     TRIMETH/SULFA <=10 SENSITIVE Sensitive     CLINDAMYCIN <=0.25 SENSITIVE Sensitive     RIFAMPIN <=0.5 SENSITIVE Sensitive     Inducible Clindamycin NEGATIVE Sensitive     * FEW STAPHYLOCOCCUS AUREUS  Anaerobic culture     Status: None (Preliminary result)   Collection Time: 09/30/2017 10:44 AM  Result Value Ref Range Status   Specimen Description WOUND LEFT ANKLE DEEP PUS  Final   Special Requests   Final  SPEC E PATIENT ON FOLLOWING DAPTOMYCIN Performed at [facility], [place] [address] [place], [place] [address]    Culture   Final    NO ANAEROBES ISOLATED; CULTURE IN PROGRESS FOR 5 DAYS   Report Status PENDING  Incomplete  Aerobic/Anaerobic Culture (surgical/deep wound)     Status: None (Preliminary result)   Collection Time: 09/13/2017 10:53 AM  Result Value Ref Range Status   Specimen Description ABSCESS RIGHT ELBOW DEEP PUS  Final   Special Requests SPEC F PATIENT ON FOLLOWING DAPTOMYCIN  Final   Gram Stain   Final    RARE WBC PRESENT, PREDOMINANTLY PMN ABUNDANT GRAM POSITIVE COCCI Performed at [facility], [place] [address], [place], [place] [address]    Culture   Final    FEW STAPHYLOCOCCUS AUREUS NO ANAEROBES ISOLATED; CULTURE IN PROGRESS FOR 5 DAYS    Report Status PENDING  Incomplete   Organism ID, Bacteria STAPHYLOCOCCUS AUREUS  Final      Susceptibility   Staphylococcus aureus - MIC*    CIPROFLOXACIN <=0.5 SENSITIVE Sensitive     ERYTHROMYCIN <=0.25 SENSITIVE Sensitive     GENTAMICIN <=0.5 SENSITIVE Sensitive     OXACILLIN 0.5 SENSITIVE Sensitive     TETRACYCLINE <=1 SENSITIVE Sensitive     VANCOMYCIN 1 SENSITIVE Sensitive     TRIMETH/SULFA <=10 SENSITIVE Sensitive     CLINDAMYCIN <=0.25 SENSITIVE Sensitive     RIFAMPIN <=0.5 SENSITIVE Sensitive     Inducible Clindamycin NEGATIVE Sensitive     * FEW STAPHYLOCOCCUS AUREUS    Studies/Results: Ct Head Wo Contrast  Result Date: 09/22/2017 CLINICAL DATA:  Altered level of consciousness. EXAM: CT HEAD WITHOUT CONTRAST TECHNIQUE: Contiguous axial images were obtained from the base of the skull through the vertex without intravenous contrast. COMPARISON:  CT head 09/09/2017 FINDINGS: Brain: Mild atrophy. Negative for hydrocephalus. Negative for acute infarct, hemorrhage, or mass lesion. No fluid collection or midline shift. Vascular: Negative for hyperdense vessel Skull: Negative Sinuses/Orbits: Patient is intubated with NG tube. Paranasal sinuses clear. Negative orbit. Bilateral mastoid effusion.  Other: None IMPRESSION: No acute abnormality and no change from prior studies. Electronically Signed   By: Franchot Gallo M.D.   On: 09/22/2017 10:56   Dg Chest Port 1 View  Result Date: 09/22/2017 CLINICAL DATA:  Endotracheally intubated. EXAM: PORTABLE CHEST 1 VIEW COMPARISON:  One-view chest x-ray 09/21/2017 FINDINGS: The heart is enlarged. This is exaggerated by low lung volumes. Endotracheal tube terminates 3.2 cm above the carina. NG tube courses off the inferior border of the film. A right IJ catheter terminates at the cavoatrial junction. Diffuse interstitial edema and bilateral effusions, right greater than left, are increasing. Bibasilar airspace disease has increased. IMPRESSION: 1. Support apparatus is stable. 2. Increasing interstitial edema and bilateral effusions, right greater than left. Findings are compatible congestive heart failure. 3. Bibasilar airspace disease likely reflects atelectasis. Infection is not excluded. Electronically Signed   By: San Morelle M.D.   On: 09/22/2017 07:15   Dg Chest Port 1 View  Result Date: 09/21/2017 CLINICAL DATA:  Endotracheal tube present EXAM: PORTABLE CHEST 1 VIEW COMPARISON:  Chest x-rays dated 09/12/2017 and 09/19/2017. FINDINGS: Endotracheal tube remains well positioned with tip approximately 3 cm above the carina. Enteric tube passes below the diaphragm. RIGHT IJ catheter is stable in position with tip overlying the lower SVC/cavoatrial junction. Improved aeration within the perihilar and lower lung regions bilaterally. Mild central pulmonary vascular congestion persists. Persistent hazy opacity at the RIGHT lung  base likely represents small pleural effusion and/or atelectasis. No pneumothorax seen. IMPRESSION: 1. Improved lung aeration bilaterally suggesting improved fluid status. Persistent mild central pulmonary vascular congestion. 2. Probable small RIGHT pleural effusion and/or atelectasis. 3. Support apparatus remains appropriately  positioned. Electronically Signed   By: Franki Cabot M.D.   On: 09/21/2017 07:26      Assessment/Plan:  INTERVAL HISTORY:   Patient with further surgeries by Dr. Mardelle Matte with Dr. Sharol Given and Dr. Caralyn Guile on Saturday  Son is moving towards extubation and comfort care.    Principal Problem:   MSSA bacteremia Active Problems:   Thalassemia   Rheumatoid arthritis ([place])   Morbid obesity ([place])   Acute back pain   Essential hypertension   Diabetes ([place])   Dyslipidemia   Severe sepsis with septic shock (HCC)   AKI (acute kidney injury) ([place])   Immunosuppression due to drug therapy   Rheumatoid arthritis(714.0)   Septic joint of left wrist ([place])   Osteomyelitis of great toe of right foot ([place])   Hypertension   Hyperlipidemia   Acute hearing loss of left ear   Diabetes mellitus, insulin dependent (IDDM), controlled (HCC)   Beta thalassemia (HCC)   Acute anemia   Elevated liver enzymes   Hyperbilirubinemia   Altered mental status, unspecified   Atrial flutter (HCC)   Infection of right hand   Abscess   Acute renal failure (HCC)   Acute respiratory failure (HCC)   Encephalopathy   Endotracheally intubated   Staphylococcal arthritis of right wrist (HCC)   Facet joint disease   Pleural effusion   Left wrist pain   Goals of care, counseling/discussion   Palliative care by specialist   History of amputation of right lower extremity through tibia and fibula ([place])   Septic olecranon bursitis of left elbow   Septic olecranon bursitis of right elbow    TRIVA HUEBER is a 57 y.o. female with  RA  Admitted with MSSA B with septic shock, ARF, shock liver with evidence of endocarditis and "metastatic infection:"With severe infection of her right ankle and toe that will require a below the knee amputation extensive infection in bilateral wrists left worse than right status post surgery by Dr. Amedeo Plenty, also with L2-L3 septic facet joint with pus collection that has been drained.  She  went to the operating room and had an extensive surgery performed by Dr. Marcelino Scot including:  1. AMPUTATION HAND (Left) THROUGH THE FOREARM 2. IRRIGATION AND DEBRIDEMENT OF RIGHT ELBOW SEPTIC BURSITIS 3. REPEAT IRRIGATION AND DEBRIDEMENT OF LEFT ELBOW SEPTIC BURSITIS 4. IRRIGATION AND DEBRIDEMENT LEFT ANKLE SOFT TISSUE ABSCESS 5. IRRIGATION AND DEBRIDEMENT right hand and thumb (Right) 6. ASPIRATION RIGHT ELBOW JOINT 7. ASPIRATION OF LEFT ANKLE JOINT 8. ASPIRATION OF RIGHT ELBOW OLECRANON BURSA 9. DRESSING CHANGE UNDER ANESTHESIA, right lower residual limb (Right)  Then further surgery by Dr. Mardelle Matte and Dr. Caralyn Guile on Saturday Incision, irrigation, debridement, skin, subcutaneous tissue, and bone, with fasciotomy of the deep posterior compartment and superficial to posterior compartment as well as the anterior compartment of the left leg.  :#1: Left elbow olecranon bursectomy #2: Right elbow olecranon bursectomy #3: Right hand irrigation and debridement excisional debridement of skin subcutaneous tissue and irrigation of the flexor sheath right thumb    #1 MSSAB with shock metastatic disease with poor prognosis  Prior aggressive plan was as below       [place] [facility] Team Staphylococcus aureus bacteremia   Staphylococcus aureus bacteremia (SAB) is associated with  a high rate of complications and mortality.  Specific aspects of clinical management are critical to optimizing the outcome of patients with SAB.  Therefore, the [facility] Antimicrobial Management Team [facility]) has initiated an intervention aimed at improving the management of SAB at [facility].  To do so, Infectious Diseases physicians are providing an evidence-based consult for the management of all patients with SAB.     Yes No Comments  Perform follow-up blood cultures (even if the patient is afebrile) to ensure clearance of bacteremia [x]  []  09/25/2017 ARE growing 1/2 sites unfortunately, repeat cultures  taken on 09/10/2017 not growing  CENTRAL LINE REMOVAL blood cultures done to prove clearance with lines gone not sure that is going to something that can be done  Remove vascular catheter and obtain follow-up blood cultures after the removal of the catheter []  []  HD line holiday when possible but I do not see her surviving to go through this  Perform echocardiography to evaluate for endocarditis (transthoracic ECHO is 40-50% sensitive, TEE is > 90% sensitive) []  []  Please keep in mind, that neither test can definitively EXCLUDE endocarditis, and that should clinical suspicion remain high for endocarditis the patient should then still be treated with an "endocarditis" duration of therapy = 6 weeks  Thickened leaflets on the mitral valve but without clear-cut vegetation  Consult electrophysiologist to evaluate implanted cardiac device (pacemaker, ICD) []  []    Ensure source control []  []  Have all abscesses been drained effectively? Have deep seeded infections (septic joints or osteomyelitis) had appropriate surgical debridement?    She has had extensive surgeries.  On 09/27/2017 she did have aspiration of the right and left elbow joints and right synovial fluid in ONE screen in Epic appears to show [place] seen on [place] I am trying to clarify with micro because if infected would require I and D arthrotomy of joint  09/10/2017 further surgery  Investigate for "metastatic" sites of infection []  []  Does the patient have ANY symptom or physical exam finding that would suggest a deeper infection (back or neck pain that may be suggestive of vertebral osteomyelitis or epidural abscess, muscle pain that could be a symptom of pyomyositis)?  Keep in mind that for deep seeded infections MRI imaging with contrast is preferred rather than other often insensitive tests such as plain x-rays, especially early in a patient's presentation.  She has had aggressive surgery by Dr. Amedeo Plenty and Mardelle Matte and Dr. Marcelino Scot and then Dr. Mardelle Matte  and Dr Caralyn Guile  See discussion below   Change antibiotic therapy to zyvox  []  []  Beta-lactam antibiotics are preferred for MSSA due to higher cure rates.   If on Vancomycin, goal trough should be 15 - 20 mcg/mL  Estimated duration of IV antibiotic therapy:  8 weeks IF she survives which she will not []  []  Consult case management for probably prolonged outpatient IV antibiotic therapy     #3 Right  foot infection: Status post transtibial amputation #4 gangrenous infection of the left wrist status post amputations of fingers and now status post mutation through the forearm. This is due to MSSA that is EVERYWHERE #5 left septic bursitis status post I&D due to MSSA #6 right hand and thumb infection status post I&D ""  #7   Right septic bursitis status post I&D ""  #8 Concern for right septic elbow given GPC on GStain? I am trying to get teeth in the microbiology lab this should be more clear as the cultures come back.  If she indeed  has infection in the elbow joint this would require a formal I&D of that joint by orthopedic surgery if we are still trying to go for aggressive care and cure of her metastatic infection.  #9 Left ankle infection sp I and D and GS shwing rare gram-positive cocci in pairs and some rare gram-negative rods though latter never grew  Recent operative report patient also likely required below the knee amputation but the son did not want this to happen..   #10  LBP: facet jiont infection as post IR guided drainage  Goals of care unfortunately Mrs. Yagi is not going to survive this hospitalization I think her son now realizes that.  She is now DO NOT RESUSCITATE DO NOT INTUBATE patient and he is working with the critical care team to decide best route to proceed whether to do a weaning trial versus no weaning trial.  Imaging of the central nervous system to see if she has any catastrophic mass lesions it was being considered this morning as well.  I spent greater than  40 minutes with the patient including greater than 50% of time in face to face counsel of the patient's son with regards to the patient's grim prognosis with regards to the multi areas that been infected and which have been addressed aggressively by surgery and by the intensive care unit team nephrology and ourselves and infectious disease and in coordination of her care with critical care medicine  I greatly appreciate the excellent care of the critical care team cardiology nephrology and multidisciplinary orthopedic surgery including doctors Hilton Sinclair , [place], Caralyn Guile and [place]  I am going off service tomorrow and will take Mrs. Stodghill off the rounding list.  If you need infectious disease input tomorrow still please call Dr. Johnnye Sima and he can help out.  I am hoping that in the interim transition has been made to comfort care alone as I think this is the most compassionate maneuver that can be made at this point in time.        LOS: 17 days   Alcide Evener 09/22/2017, 12:42 PM

## 2017-09-22 NOTE — Progress Notes (Signed)
Pt has a temp of 103.7 orally. Patient's son, Jessica Howard did not want tylenol to be administered. Patient's son would like all medications to be discontinued and that pt be a full comfort care. Notified resident Charlynn Grimes.

## 2017-09-22 NOTE — Progress Notes (Addendum)
Patient's son, Myna Bright, at bedside. Resting transition to full comfort care with discontinuation of all non-comfort medications including antibiotics, lab draws, cbg checks. Will discontinue all non-comfort care measures.

## 2017-09-23 ENCOUNTER — Ambulatory Visit: Payer: Self-pay | Admitting: *Deleted

## 2017-09-23 LAB — AEROBIC/ANAEROBIC CULTURE (SURGICAL/DEEP WOUND)

## 2017-09-23 LAB — AEROBIC/ANAEROBIC CULTURE W GRAM STAIN (SURGICAL/DEEP WOUND)

## 2017-09-23 LAB — GLUCOSE, CAPILLARY: Glucose-Capillary: 94 mg/dL (ref 65–99)

## 2017-09-24 ENCOUNTER — Ambulatory Visit: Payer: Self-pay | Admitting: Pharmacist

## 2017-09-24 LAB — ANAEROBIC CULTURE

## 2017-09-25 ENCOUNTER — Encounter (HOSPITAL_COMMUNITY): Payer: Medicare Other

## 2017-09-25 LAB — POCT I-STAT EG7
Acid-base deficit: 3 mmol/L — ABNORMAL HIGH (ref 0.0–2.0)
Bicarbonate: 24.3 mmol/L (ref 20.0–28.0)
Calcium, Ion: 0.99 mmol/L — ABNORMAL LOW (ref 1.15–1.40)
HEMATOCRIT: 46 % (ref 36.0–46.0)
HEMOGLOBIN: 15.6 g/dL — AB (ref 12.0–15.0)
O2 Saturation: 55 %
PCO2 VEN: 51.5 mmHg (ref 44.0–60.0)
POTASSIUM: 4 mmol/L (ref 3.5–5.1)
Sodium: 132 mmol/L — ABNORMAL LOW (ref 135–145)
TCO2: 26 mmol/L (ref 22–32)
pH, Ven: 7.283 (ref 7.250–7.430)
pO2, Ven: 33 mmHg (ref 32.0–45.0)

## 2017-09-29 ENCOUNTER — Telehealth: Payer: Self-pay

## 2017-09-29 NOTE — Telephone Encounter (Signed)
On 09/29/17 I received a d/c from [place] (original). The d/c is for burial. The patient is a patient of Doctor Byrum. The d/c will be taken to [facility] this am for signature.  On 09/30/17 I received the d/c back from Doctor Byrum. I got the d/c ready and called the funeral home to let them know the d/c is ready for pickup.

## 2017-10-07 ENCOUNTER — Encounter (INDEPENDENT_AMBULATORY_CARE_PROVIDER_SITE_OTHER): Payer: Medicare Other | Admitting: Ophthalmology

## 2017-10-08 NOTE — Progress Notes (Signed)
Wasted 50cc of Morphine and 65cc of Fentanyl in the sink. Witnessed by Rito Ehrlich RN.

## 2017-10-08 NOTE — Progress Notes (Signed)
Pt expired at 0203. No breath sounds or lung sounds auscultated. Verified by Deboraha Sprang RN. Family present during this time. Offered chaplain services, which declined. Provided support. Notified on-call resident [place].

## 2017-10-08 NOTE — Death Summary Note (Signed)
  Name: Jessica Howard MRN: [id number] DOB: January 28, 1961 57 y.o.  Date of Admission: 08/22/2017  5:34 AM Date of Discharge: 2017/10/07 Attending Physician: No att. providers found  Discharge Diagnosis: Principal Problem:   MSSA bacteremia Active Problems:   Thalassemia   Rheumatoid arthritis ([place])   Morbid obesity ([place])   Acute back pain   Essential hypertension   Diabetes ([place])   Dyslipidemia   Severe sepsis with septic shock ([place])   AKI (acute kidney injury) ([place])   Immunosuppression due to drug therapy   Rheumatoid arthritis(714.0)   Septic joint of left wrist ([place])   Osteomyelitis of great toe of right foot ([place])   Hypertension   Hyperlipidemia   Acute hearing loss of left ear   Diabetes mellitus, insulin dependent (IDDM), controlled (HCC)   Beta thalassemia (HCC)   Acute anemia   Elevated liver enzymes   Hyperbilirubinemia   Altered mental status, unspecified   Atrial flutter (HCC)   Infection of right hand   Abscess   Acute renal failure (HCC)   Acute respiratory failure (HCC)   Encephalopathy   Endotracheally intubated   Staphylococcal arthritis of right wrist (HCC)   Facet joint disease   Pleural effusion   Left wrist pain   Goals of care, counseling/discussion   Palliative care by specialist   History of amputation of right lower extremity through tibia and fibula ([place])   Septic olecranon bursitis of left elbow   Septic olecranon bursitis of right elbow   Terminal care  Cause of death: Disseminated MSSA Bacteremia  Time of death: 02:03  Disposition and follow-up:   Ms.Jessica Howard was discharged from [facility] in expired condition.    Hospital Course: Jessica Howard was a 57 y.o female with RA on immunosuppressive therapy (Levunomide and Abatacept) and DM who presented to the ED with 2 weeks of progressive myalgias and arthralgias. Initial work-up was significant for AKI, HAGMA, elevated liver enzymes, lactic acidosis,  leukocytosis, and acute on chronic anemia. Blood cultures subsequently grew MSSA (4/4). Initial TTE was negative for features of endocarditis; however, patient has pathologic physical exam findings including splinter hemorrhages and Olser nodes. Her renal function worsened over the course of her hospitalization and she is requiring CRRT. The patient also converted to atrial fibrillation and was placed on amiodarone, diltiazem, and lopressor. MRI of the right foot illustrated septic arthritis of the right first IP with associated osteomyelitis. MRI of the lumbar spine showed septic arthritis of the right L1-2 facet joint with associated fluid collection. The patient went for I&D on 4/4 and we obtained a TEE that could not exclude endocarditis. She was successfully cardioverted at that time. The patient then went for four additional surgeries including, I&D on 4/7, right BKA on 4/9, left UE amputation on 4/11, and I&D on 4/13. Unfortunately adequate source control could not be achieved with aggressive surgical measures. Palliative care team was involved to discuss goals of care with the patient's family. The family elected to pursue one-way extubation on 4/15 with plans to transition to comfort care. After extubated the patient was treated with a fentanyl and morphine drip for comfort. All other medications were discontinued. The patient expired on 4/16 at 02:03 AM with family at bedside.   Signed: Ina Homes, MD 2017-10-07, 5:05 AM

## 2017-10-08 NOTE — Op Note (Signed)
Jessica Howard, Jessica Howard             ACCOUNT NO.:  [id number]  MEDICAL RECORD NO.:  [id number]  LOCATION:                                 FACILITY:  PHYSICIAN:  Astrid Divine. Marcelino Scot, M.D. DATE OF BIRTH:  08-09-1960  DATE OF PROCEDURE:  09/29/2017 DATE OF DISCHARGE:                              OPERATIVE REPORT   PREOPERATIVE DIAGNOSES: 1. Gangrenous infection, left wrist, ulna, carpus with necrotic tissue     envelope and desiccated tendons. 2. Right elbow septic bursitis. 3. Left ankle soft tissue abscess. 4. Open right thumb infection. 5. Open left olecranon bursitis, infection. 6. Right below-knee amputation.  PROCEDURES: 1. Amputation of the left hand through the forearm. 2. Irrigation and debridement of right elbow septic bursitis. 3. Repeat irrigation and debridement of left elbow septic bursitis. 4. Irrigation and debridement of left ankle soft tissue abscess. 5. Irrigation and debridement of right hand and thumb. 6. Aspiration of right elbow joint. 7. Aspiration of left ankle joint. 8. Aspiration of right elbow olecranon bursa. 9. Dressing change under anesthesia, right lower residual limb below     knee amputation stump.  SURGEON:  Astrid Divine. Marcelino Scot, M.D.  ASSISTANT:  Ainsley Spinner, PA-C  ANESTHESIA:  General.  COMPLICATIONS:  None.  ESTIMATED BLOOD LOSS:  200 mL.  DRAINS:  None.  LOCAL MEDICATION:  None.  SPECIMENS:  Multiple: 1. Right elbow joint. 2. Right olecranon bursa aspiration. 3. Right olecranon bursa material x2. 4. Left ankle abscess. 5. Left hand gross specimen.  DISPOSITION OF SPECIMENS:  To Micro and Path.  COUNTS:  Correct.  TOURNIQUET:  None.  PATIENT DISPOSITION:  To ICU.  CONDITION:  Hemodynamically stable.  BRIEF SUMMARY OF INDICATIONS FOR SURGERY:  The patient is a 57 year old female, known to me from previous surgery many years ago and who was under the care of one of my call partners who was unavailable to perform this serial  debridement.  He had, however, counseled with me extensively prior to the procedure and I also had an opportunity to speak at some length and in detail with the patient's primary decision maker who was her son.  He stressed to me the patient's desire to fight through this illness to the best of her ability regardless of the length or physiologic cost, and consequently, he directed [place] to be aggressive with the debridement in light of her ongoing sepsis and numerous procedures with new loci of infection.  He understood the very real risk of death, ongoing or new infection, nerve injury, vessel injury, loss of function of tendons from the procedures, and others.  Again, consent was given to proceed.  DESCRIPTION OF PROCEDURE:  The patient remained on scheduled IV antibiotics that were adjusted to her renal function and under the control and oversight of the Infectious Disease Service.  Upon removal of the hand dressing on the left, the tendons were found to be desiccated.  There was active purulence from the carpus and in adequate skin covering or envelope for even a delayed closure given the demarcation of the flap between incisions.  I counseled with Dr. Amedeo Plenty who had assisted Dr. Mardelle Matte from the perspective of hand management previously and outlined the  current findings as well as with the hand surgeon on-call, Dr. Leanora Cover.  All three of [place] were in agreement with the indication for amputation to save her life or at least to give her the best chance of resolving this infection as the hand at this point was not viable.  I then went and discussed these findings with her son and family in a conference room.  They did carefully considered these factors and indicated that I should proceed with amputation through the forearm.  The left upper extremity was prepped and draped in usual fashion while attempting to prevent contamination from the open wrist wound with the rest of the forearm.   A fishmouth-type incision was marked after the time-out and made on the volar and dorsal aspects of the forearm to raise the flaps.  I sharply debrided the tendons back to healthy muscle tissue.  I did not encounter any areas of purulence.  I again did not leave any tendinous structures or continuity with the distal aspect of the arm, identified the ulnar nerve and artery, ligating them proximally.  Similarly, the radial nerve and artery were identified and divided.  A bone saw was used to cut the ulna and radius while protecting the surrounding soft tissues.  This area was thoroughly irrigated to remove bone dust and any potential contaminants and then a careful but somewhat loose formal closure performed with 0 PDS for the deep layer, 2-0 PDS for the more superficial layer, and nylon sutures for the skin.  It should be noted the Prolene was used for tying off the vessels to maintain a monofilament-type involvement.  A gently compressive wrap with Mepitel was then applied to the forearm.  The left elbow incision from debridement of the bursitis was irrigated thoroughly.  I did debride some areas of skin and deep tissue that were not viable and was able to place a couple of loose sutures with a gently compressive dressing here.  I did not appreciate ongoing infection, but rather improved control in this area.  We then turned our attention to the left ankle.  Here, aspiration was performed of the left ankle joint because of swelling and redness with a posterior blister.  There was no fluid within the ankle joint, and consequently, no fluid sent from this area.  I then aspirated the area of fullness around the left ankle posteriorly and this returned mill chocolate-appearing purulence.  This was sent for culture.  A longitudinal incision was made in this area and extensive pocket identified.  It was debrided with exploration and curettage connecting all the loculated pockets and evacuating  the abscess.  This went posteriorly and laterally around the ankle in addition to medially.  This was left open with a simple Penrose in place.  Sterile gently compressive dressing was applied.  Attention was then turned to the right leg.  The wound from Dr. Luanna Cole below-knee amputation appeared to be in excellent condition.  The dressing was removed as the patient was under anesthesia for comfort as well as thorough evaluation.  A gently compressive dressing was reapplied here.  We then turned attention to the right elbow where some swelling was noted around the elbow.  An aspiration was performed to the radiocapitellar joint, staying somewhat anteriorly.  This returned clear greenish synovial fluid from the elbow joint.  I did nonetheless send this for culture to confirm and there was a sizable effusion.  A separate aspiration was then performed of the elbow bursa.  This returned what appeared to be purulent fluid, though it may have been tophaceous.  Consequently, this side was thoroughly prepped and draped in usual sterile fashion including the previously open right hand and thumb wound.  These wounds did have some small areas of necrosis, but did not appear to be struggling with ongoing active infection rather to have been improving.  Home a longitudinal incision was made over the olecranon bursa.  This returned 5-8 mL of tophaceous material or infection.  I did send additional cultures and specimen to Path and Micro.  Following this, copious irrigation was performed and then a gently compressive dressing, leaving the incision open.  The hand was thoroughly irrigated, excising some dead skin, subcutaneous tissue, muscle fascia, but no bone was visible or exposed.  The wound was dressed with mepilex, guaze and tweeners between the fingers to reduce maceration. A sterile dressing was applied from hand to arm.  Ainsley Spinner, PA-C, assisted me throughout with all procedures, working  concurrently. An assistant was necessary to expedite her time in the the operating room and in addition to enable exposure and retraction for performance of the left forearm amputation.  PROGNOSIS: Mrs. Macfadden remains in critical condition with a guarded prognosis. Clean margins appear to have been obtained at the left arm and right leg amputation sites but the new onset left ankle infection and right elbow bursa infection with renal function limiting antibiotic administration are concerning. Care will resume under Dr. Mardelle Matte and colleagues though I remain available to assist as well with this very nice family. Return to the operating room is anticipated for the left ankle.      ______________________________ Astrid Divine. Marcelino Scot, M.D.    MHH/MEDQ  D:  10/05/2017  T:  10/06/2017  Job:  [id number]

## 2017-10-08 DEATH — deceased

## 2017-10-16 ENCOUNTER — Ambulatory Visit: Payer: Medicare Other

## 2017-10-16 ENCOUNTER — Other Ambulatory Visit: Payer: Medicare Other

## 2017-10-16 ENCOUNTER — Ambulatory Visit: Payer: Medicare Other | Admitting: Oncology

## 2017-11-04 ENCOUNTER — Ambulatory Visit: Payer: Medicare Other | Admitting: Podiatry

## 2017-11-18 ENCOUNTER — Other Ambulatory Visit: Payer: Medicare Other

## 2017-11-18 ENCOUNTER — Ambulatory Visit: Payer: Medicare Other
